# Patient Record
Sex: Female | Born: 1940 | Race: White | Hispanic: No | Smoking: Never smoker
Health system: Southern US, Community
[De-identification: ages and names within clinical notes are randomized; demographics above are authoritative.]

## PROBLEM LIST (undated history)

## (undated) DIAGNOSIS — M810 Age-related osteoporosis without current pathological fracture: Secondary | ICD-10-CM

## (undated) DIAGNOSIS — I10 Essential (primary) hypertension: Secondary | ICD-10-CM

## (undated) DIAGNOSIS — F32A Depression, unspecified: Secondary | ICD-10-CM

## (undated) DIAGNOSIS — F329 Major depressive disorder, single episode, unspecified: Secondary | ICD-10-CM

## (undated) DIAGNOSIS — E876 Hypokalemia: Secondary | ICD-10-CM

## (undated) DIAGNOSIS — I639 Cerebral infarction, unspecified: Secondary | ICD-10-CM

## (undated) DIAGNOSIS — R739 Hyperglycemia, unspecified: Secondary | ICD-10-CM

## (undated) DIAGNOSIS — R2681 Unsteadiness on feet: Secondary | ICD-10-CM

## (undated) DIAGNOSIS — F419 Anxiety disorder, unspecified: Secondary | ICD-10-CM

## (undated) DIAGNOSIS — R42 Dizziness and giddiness: Secondary | ICD-10-CM

## (undated) HISTORY — PX: BREAST BIOPSY: SHX20

## (undated) HISTORY — DX: Cerebral infarction, unspecified: I63.9

## (undated) HISTORY — DX: Age-related osteoporosis without current pathological fracture: M81.0

## (undated) HISTORY — DX: Hypokalemia: E87.6

## (undated) HISTORY — DX: Major depressive disorder, single episode, unspecified: F32.9

## (undated) HISTORY — PX: CHOLECYSTECTOMY: SHX55

## (undated) HISTORY — DX: Unsteadiness on feet: R26.81

## (undated) HISTORY — DX: Depression, unspecified: F32.A

## (undated) HISTORY — DX: Anxiety disorder, unspecified: F41.9

## (undated) HISTORY — PX: VAGINAL HYSTERECTOMY: SUR661

## (undated) HISTORY — DX: Hyperglycemia, unspecified: R73.9

---

## 2002-11-11 ENCOUNTER — Other Ambulatory Visit: Admission: RE | Admit: 2002-11-11 | Discharge: 2002-11-11 | Payer: Self-pay | Admitting: Gynecology

## 2004-09-29 ENCOUNTER — Ambulatory Visit: Payer: Self-pay | Admitting: Internal Medicine

## 2005-11-28 ENCOUNTER — Ambulatory Visit: Payer: Self-pay | Admitting: Internal Medicine

## 2007-02-14 ENCOUNTER — Ambulatory Visit: Payer: Self-pay | Admitting: Internal Medicine

## 2008-04-16 ENCOUNTER — Ambulatory Visit: Payer: Self-pay | Admitting: Internal Medicine

## 2010-03-02 ENCOUNTER — Ambulatory Visit: Payer: Self-pay | Admitting: Internal Medicine

## 2011-03-20 ENCOUNTER — Ambulatory Visit: Payer: Self-pay | Admitting: Internal Medicine

## 2011-03-21 ENCOUNTER — Ambulatory Visit: Payer: Self-pay | Admitting: Internal Medicine

## 2012-02-01 DIAGNOSIS — I639 Cerebral infarction, unspecified: Secondary | ICD-10-CM

## 2012-02-01 HISTORY — DX: Cerebral infarction, unspecified: I63.9

## 2012-02-24 ENCOUNTER — Observation Stay (HOSPITAL_COMMUNITY): Payer: Medicare Other

## 2012-02-24 ENCOUNTER — Inpatient Hospital Stay (HOSPITAL_COMMUNITY)
Admission: EM | Admit: 2012-02-24 | Discharge: 2012-02-29 | DRG: 065 | Disposition: A | Discharge status: Home / Self Care | Payer: Medicare Other | Attending: Internal Medicine | Admitting: Internal Medicine

## 2012-02-24 ENCOUNTER — Encounter (HOSPITAL_COMMUNITY): Payer: Self-pay | Admitting: *Deleted

## 2012-02-24 DIAGNOSIS — H8110 Benign paroxysmal vertigo, unspecified ear: Secondary | ICD-10-CM | POA: Diagnosis present

## 2012-02-24 DIAGNOSIS — T502X5A Adverse effect of carbonic-anhydrase inhibitors, benzothiadiazides and other diuretics, initial encounter: Secondary | ICD-10-CM | POA: Diagnosis present

## 2012-02-24 DIAGNOSIS — R2681 Unsteadiness on feet: Secondary | ICD-10-CM | POA: Diagnosis present

## 2012-02-24 DIAGNOSIS — I10 Essential (primary) hypertension: Secondary | ICD-10-CM | POA: Diagnosis present

## 2012-02-24 DIAGNOSIS — R269 Unspecified abnormalities of gait and mobility: Secondary | ICD-10-CM | POA: Diagnosis present

## 2012-02-24 DIAGNOSIS — R7309 Other abnormal glucose: Secondary | ICD-10-CM | POA: Diagnosis not present

## 2012-02-24 DIAGNOSIS — I6789 Other cerebrovascular disease: Secondary | ICD-10-CM | POA: Diagnosis present

## 2012-02-24 DIAGNOSIS — E876 Hypokalemia: Secondary | ICD-10-CM | POA: Diagnosis present

## 2012-02-24 DIAGNOSIS — E785 Hyperlipidemia, unspecified: Secondary | ICD-10-CM | POA: Diagnosis present

## 2012-02-24 DIAGNOSIS — R42 Dizziness and giddiness: Secondary | ICD-10-CM

## 2012-02-24 DIAGNOSIS — Z79899 Other long term (current) drug therapy: Secondary | ICD-10-CM

## 2012-02-24 DIAGNOSIS — R112 Nausea with vomiting, unspecified: Secondary | ICD-10-CM | POA: Diagnosis present

## 2012-02-24 DIAGNOSIS — S2239XA Fracture of one rib, unspecified side, initial encounter for closed fracture: Secondary | ICD-10-CM | POA: Diagnosis present

## 2012-02-24 DIAGNOSIS — X58XXXA Exposure to other specified factors, initial encounter: Secondary | ICD-10-CM | POA: Diagnosis present

## 2012-02-24 DIAGNOSIS — I639 Cerebral infarction, unspecified: Secondary | ICD-10-CM

## 2012-02-24 DIAGNOSIS — I6930 Unspecified sequelae of cerebral infarction: Secondary | ICD-10-CM | POA: Diagnosis present

## 2012-02-24 DIAGNOSIS — R739 Hyperglycemia, unspecified: Secondary | ICD-10-CM | POA: Diagnosis present

## 2012-02-24 DIAGNOSIS — I69398 Other sequelae of cerebral infarction: Secondary | ICD-10-CM

## 2012-02-24 DIAGNOSIS — I6350 Cerebral infarction due to unspecified occlusion or stenosis of unspecified cerebral artery: Principal | ICD-10-CM | POA: Diagnosis present

## 2012-02-24 DIAGNOSIS — Y92009 Unspecified place in unspecified non-institutional (private) residence as the place of occurrence of the external cause: Secondary | ICD-10-CM | POA: Diagnosis present

## 2012-02-24 HISTORY — DX: Dizziness and giddiness: R42

## 2012-02-24 HISTORY — DX: Essential (primary) hypertension: I10

## 2012-02-24 LAB — URINALYSIS, ROUTINE W REFLEX MICROSCOPIC
Bilirubin Urine: NEGATIVE
Glucose, UA: NEGATIVE mg/dL
Hgb urine dipstick: NEGATIVE
Ketones, ur: NEGATIVE mg/dL
Leukocytes, UA: NEGATIVE
Nitrite: NEGATIVE
Protein, ur: NEGATIVE mg/dL
Specific Gravity, Urine: 1.016 (ref 1.005–1.030)
Urobilinogen, UA: 0.2 mg/dL (ref 0.0–1.0)
pH: 7.5 (ref 5.0–8.0)

## 2012-02-24 MED ORDER — ONDANSETRON HCL 8 MG PO TABS
8.0000 mg | ORAL_TABLET | Freq: Once | ORAL | Status: AC
  Administered 2012-02-24: 8 mg via ORAL
  Filled 2012-02-24: qty 1

## 2012-02-24 MED ORDER — MECLIZINE HCL 25 MG PO TABS
25.0000 mg | ORAL_TABLET | Freq: Once | ORAL | Status: AC
  Administered 2012-02-24: 25 mg via ORAL
  Filled 2012-02-24: qty 1

## 2012-02-24 MED ORDER — LOSARTAN POTASSIUM 50 MG PO TABS
50.0000 mg | ORAL_TABLET | Freq: Every day | ORAL | Status: DC
  Filled 2012-02-24: qty 1

## 2012-02-24 MED ORDER — HYDROCHLOROTHIAZIDE 12.5 MG PO CAPS: 12.5000 mg | ORAL_CAPSULE | Freq: Every day | ORAL | Status: DC

## 2012-02-24 MED ORDER — PROMETHAZINE HCL 25 MG/ML IJ SOLN
25.0000 mg | INTRAMUSCULAR | Status: AC
  Administered 2012-02-24: 25 mg via INTRAVENOUS
  Filled 2012-02-24 (×2): qty 1

## 2012-02-24 MED ORDER — ONDANSETRON 8 MG/NS 50 ML IVPB
8.0000 mg | INTRAVENOUS | Status: DC
  Filled 2012-02-24: qty 8

## 2012-02-24 MED ORDER — PROMETHAZINE HCL 25 MG/ML IJ SOLN
12.5000 mg | INTRAMUSCULAR | Status: AC
  Administered 2012-02-24: 12.5 mg via INTRAVENOUS
  Filled 2012-02-24: qty 1

## 2012-02-24 MED ORDER — ONDANSETRON HCL 4 MG/2ML IJ SOLN
INTRAMUSCULAR | Status: AC
  Administered 2012-02-24: 11:00:00
  Filled 2012-02-24: qty 2

## 2012-02-24 MED ORDER — LOSARTAN POTASSIUM-HCTZ 50-12.5 MG PO TABS: 1.0000 | ORAL_TABLET | Freq: Every day | ORAL | Status: DC

## 2012-02-24 MED ORDER — DIAZEPAM 5 MG/ML IJ SOLN
5.0000 mg | Freq: Once | INTRAMUSCULAR | Status: DC
  Filled 2012-02-24: qty 2

## 2012-02-24 MED ORDER — DIAZEPAM 5 MG/ML IJ SOLN
5.0000 mg | Freq: Once | INTRAMUSCULAR | Status: AC
  Administered 2012-02-24: 5 mg via INTRAVENOUS

## 2012-02-24 MED ORDER — LOSARTAN POTASSIUM 50 MG PO TABS
50.0000 mg | ORAL_TABLET | Freq: Every day | ORAL | Status: DC
  Administered 2012-02-25: 50 mg via ORAL
  Filled 2012-02-24 (×3): qty 1

## 2012-02-24 NOTE — ED Notes (Signed)
Yesterday - husband had to picked up pt. B/c of acute onset of vertigo; now, she is c/o ?9th rib pain; went to urgent care for rib pain - 9th rib fx; still having vertigo today.

## 2012-02-24 NOTE — ED Notes (Signed)
Pt. Given po ice chips

## 2012-02-24 NOTE — ED Notes (Signed)
I gave the patient a cup of ice. 

## 2012-02-24 NOTE — ED Provider Notes (Signed)
Medical screening examination/treatment/procedure(s) were conducted as a shared visit with non-physician practitioner(s) and myself.  I personally evaluated the patient during the encounter.  70yf with vertigo. Suspect peripheral etiology. Very positional. Reproducible with dix hallpike but not really fatigueable with repeat maneuvers as would expect with peripheral cause. Negative Hennebert's sign and no hx of significant head trauma. No hx of potentially ototoxic med use such as aminoglycoside or furosemide. No vertical nystagmus. Good finger to nose b/l. Could not assess gait because too symptomatic to attempt walking. Pt medicated in ED with little/no improvement. I suspect that this is peripheral but some signs/symptoms raise question of central cause. Will MR to further eval. Continued symptomatic tx.   Raeford Razor, MD 02/24/12 [phone]

## 2012-02-24 NOTE — ED Provider Notes (Signed)
History     CSN: [id number]  Arrival date & time 02/24/12  1032   First MD Initiated Contact with Patient 02/24/12 1221      Chief Complaint  Patient presents with  . Dizziness  . Rib Injury    (Consider location/radiation/quality/duration/timing/severity/associated sxs/prior treatment) HPI Comments: 71 y/o female presents with sudden onset nausea, vomiting, and dizziness last night. Has a hx of vertigo and states this feels exactly the same. ENT prescribed her meclizine and valium for vertigo, tried taking them and vomited them back up. Dizziness worse with head movement. Denies any visual changes, blurred vision, tinnitus, ear pain, headache, chest pain, sob, fever, chills. Broke her rib a week and a half ago and is in some discomfort with that, but has not taken her percocet.  The history is provided by the patient and the spouse.    Past Medical History  Diagnosis Date  . Hypertension   . Vertigo     No past surgical history on file.  No family history on file.  History  Substance Use Topics  . Smoking status: Never Smoker   . Smokeless tobacco: Not on file  . Alcohol Use: No    OB History    Grav Para Term Preterm Abortions TAB SAB Ect Mult Living                  Review of Systems  Constitutional: Negative for fever and chills.  HENT: Negative for ear pain and tinnitus.   Eyes: Negative for visual disturbance.  Respiratory: Negative for shortness of breath.   Cardiovascular: Negative for chest pain.  Gastrointestinal: Positive for nausea and vomiting.  Neurological: Positive for dizziness. Negative for headaches.    Allergies  Demerol and Penicillins  Home Medications   Current Outpatient Rx  Name Route Sig Dispense Refill  . DIAZEPAM 2 MG PO TABS Oral Take 1 mg by mouth daily as needed. For dizziness.     Marland Kitchen LOSARTAN POTASSIUM-HCTZ 50-12.5 MG PO TABS Oral Take 1 tablet by mouth daily.    Marland Kitchen MECLIZINE HCL 25 MG PO TABS Oral Take 25 mg by mouth 3  (three) times daily as needed. For vertigo.    Marland Kitchen POTASSIUM PO Oral Take 1 tablet by mouth daily.      BP 177/80  Pulse 81  Temp 97.7 F (36.5 C) (Oral)  Resp 20  SpO2 97%  Physical Exam  Constitutional: She is oriented to person, place, and time. She appears well-developed and well-nourished. No distress.  HENT:  Head: Normocephalic and atraumatic.  Right Ear: Hearing, tympanic membrane, external ear and ear canal normal.  Left Ear: Hearing, tympanic membrane, external ear and ear canal normal.  Nose: Nose normal.  Mouth/Throat: Uvula is midline, oropharynx is clear and moist and mucous membranes are normal.  Eyes: Conjunctivae and EOM are normal. Pupils are equal, round, and reactive to light. Right eye exhibits no nystagmus. Left eye exhibits no nystagmus.  Neck: Neck supple.  Cardiovascular: Normal rate, regular rhythm, normal heart sounds and intact distal pulses.   Pulmonary/Chest: Effort normal and breath sounds normal.  Abdominal: Soft. Normal appearance and bowel sounds are normal. There is no tenderness.  Neurological: She is alert and oriented to person, place, and time.       Unsteady on feet- slow gait. Dizziness exacerbated by head movements.  Skin: Skin is warm and dry.  Psychiatric: She has a normal mood and affect. Her speech is normal and behavior is normal.  ED Course  Procedures (including critical care time)   Labs Reviewed  CBC WITH DIFFERENTIAL   No results found.  Date: 02/24/2012  Rate: 90  Rhythm: normal sinus rhythm  QRS Axis: left  Intervals: normal  ST/T Wave abnormalities: nonspecific T wave changes  Conduction Disutrbances:none  Narrative Interpretation: no stemi  Old EKG Reviewed: none available    No diagnosis found.    MDM  71 y/o female with dizziness and peripheral vertigo symptoms. Symptoms somewhat improved with phenergan and valium. She is concerned for a stroke. No focal neuro deficits concerning stroke. No findings  consistent with central vertigo. Patient is convinced something is wrong with her. Case discussed with Dr. Juleen China who also evaluated patient. Will do MRI. Patient moved to CDU. Case discussed with Emilia Beck, PA-C who will take over care of patient at this time.        Trevor Mace, PA-C 02/24/12 1505

## 2012-02-24 NOTE — ED Provider Notes (Signed)
Assumed care in CDU.  71 year old female presents for evaluation of dizziness.  She is being sent to CDU awaiting for brain MRI to r/o posterior circular stroke.    5:14 PM Pt continues to endorse vertiginous sxs, with associate nausea and vomiting.  Unable to tolerates MRI at this time.  Will continue to treat sxs, and will reassess.  Pt otherwise denies of double vision, headache, or numbness.    5:48 PM MRI tech came to take pt to MRI but pt continues to be symptomatic.  They will return in half an hr to take pt to MRI.    6:25 PM Pt unable to tolerate MRI currently.  Plan to keep pt in CDU over night and continues to treat her symptoms, will obtain brain MRI tomorrow morning.  Pt and family member in agreement with plan.    10:37 PM Pt request for nightly med, Hyzaar, will order  11:41 PM Pt sts she feels about the same.  Have not vomited, only mild nausea.  Will give meclizine.  Dr. Clarene Duke is aware of pt and will monitor over night.  Pt for brain MRI tomorrow.    Fayrene Helper, PA-C 02/24/12 2342

## 2012-02-25 ENCOUNTER — Inpatient Hospital Stay (HOSPITAL_COMMUNITY): Payer: Medicare Other

## 2012-02-25 ENCOUNTER — Encounter (HOSPITAL_COMMUNITY): Payer: Self-pay | Admitting: Internal Medicine

## 2012-02-25 ENCOUNTER — Observation Stay (HOSPITAL_COMMUNITY): Payer: Medicare Other

## 2012-02-25 DIAGNOSIS — R112 Nausea with vomiting, unspecified: Secondary | ICD-10-CM | POA: Diagnosis present

## 2012-02-25 DIAGNOSIS — I6930 Unspecified sequelae of cerebral infarction: Secondary | ICD-10-CM | POA: Diagnosis present

## 2012-02-25 DIAGNOSIS — R42 Dizziness and giddiness: Secondary | ICD-10-CM | POA: Diagnosis present

## 2012-02-25 DIAGNOSIS — R2681 Unsteadiness on feet: Secondary | ICD-10-CM | POA: Diagnosis present

## 2012-02-25 LAB — CK TOTAL AND CKMB (NOT AT ARMC)
CK, MB: 6 ng/mL — ABNORMAL HIGH (ref 0.3–4.0)
Total CK: 386 U/L — ABNORMAL HIGH (ref 7–177)

## 2012-02-25 LAB — DIFFERENTIAL
Basophils Relative: 0 % (ref 0–1)
Lymphocytes Relative: 17 % (ref 12–46)
Monocytes Absolute: 0.6 10*3/uL (ref 0.1–1.0)
Monocytes Relative: 6 % (ref 3–12)
Neutro Abs: 8 10*3/uL — ABNORMAL HIGH (ref 1.7–7.7)

## 2012-02-25 LAB — COMPREHENSIVE METABOLIC PANEL
BUN: 15 mg/dL (ref 6–23)
CO2: 26 mEq/L (ref 19–32)
Chloride: 103 mEq/L (ref 96–112)
Creatinine, Ser: 0.77 mg/dL (ref 0.50–1.10)
GFR calc non Af Amer: 83 mL/min — ABNORMAL LOW (ref >90)
Total Bilirubin: 0.5 mg/dL (ref 0.3–1.2)

## 2012-02-25 LAB — CBC
HCT: 35.7 % — ABNORMAL LOW (ref 36.0–46.0)
Hemoglobin: 12.2 g/dL (ref 12.0–15.0)
MCHC: 34.2 g/dL (ref 30.0–36.0)
MCV: 85.4 fL (ref 78.0–100.0)

## 2012-02-25 LAB — APTT: aPTT: 27 seconds (ref 24–37)

## 2012-02-25 MED ORDER — SENNOSIDES-DOCUSATE SODIUM 8.6-50 MG PO TABS
1.0000 | ORAL_TABLET | Freq: Every evening | ORAL | Status: DC | PRN
  Filled 2012-02-25: qty 1

## 2012-02-25 MED ORDER — PROMETHAZINE HCL 25 MG/ML IJ SOLN
12.5000 mg | INTRAMUSCULAR | Status: AC
  Administered 2012-02-25: 12.5 mg via INTRAVENOUS
  Filled 2012-02-25: qty 1

## 2012-02-25 MED ORDER — MECLIZINE HCL 25 MG PO TABS
25.0000 mg | ORAL_TABLET | Freq: Three times a day (TID) | ORAL | Status: DC | PRN
  Administered 2012-02-25 – 2012-02-28 (×7): 25 mg via ORAL
  Filled 2012-02-25 (×8): qty 1

## 2012-02-25 MED ORDER — MECLIZINE HCL 25 MG PO TABS
25.0000 mg | ORAL_TABLET | Freq: Once | ORAL | Status: AC
  Administered 2012-02-25: 25 mg via ORAL
  Filled 2012-02-25: qty 1

## 2012-02-25 MED ORDER — DIAZEPAM 2 MG PO TABS: 1.0000 mg | ORAL_TABLET | Freq: Four times a day (QID) | ORAL | Status: DC | PRN

## 2012-02-25 MED ORDER — ASPIRIN 300 MG RE SUPP
300.0000 mg | Freq: Every day | RECTAL | Status: DC
  Filled 2012-02-25 (×5): qty 1

## 2012-02-25 MED ORDER — ACETAMINOPHEN 650 MG RE SUPP: 650.0000 mg | RECTAL | Status: DC | PRN

## 2012-02-25 MED ORDER — HEPARIN SODIUM (PORCINE) 5000 UNIT/ML IJ SOLN
5000.0000 [IU] | Freq: Three times a day (TID) | INTRAMUSCULAR | Status: DC
  Administered 2012-02-25 – 2012-02-29 (×11): 5000 [IU] via SUBCUTANEOUS
  Filled 2012-02-25 (×15): qty 1

## 2012-02-25 MED ORDER — ASPIRIN 325 MG PO TABS
325.0000 mg | ORAL_TABLET | Freq: Every day | ORAL | Status: DC
  Administered 2012-02-25 – 2012-02-28 (×4): 325 mg via ORAL
  Filled 2012-02-25 (×5): qty 1

## 2012-02-25 MED ORDER — PROMETHAZINE HCL 25 MG/ML IJ SOLN
12.5000 mg | Freq: Four times a day (QID) | INTRAMUSCULAR | Status: DC | PRN
  Administered 2012-02-25: 12.5 mg via INTRAVENOUS
  Filled 2012-02-25 (×2): qty 1

## 2012-02-25 MED ORDER — ACETAMINOPHEN 325 MG PO TABS
650.0000 mg | ORAL_TABLET | ORAL | Status: DC | PRN
  Filled 2012-02-25: qty 2

## 2012-02-25 NOTE — ED Provider Notes (Signed)
Medical screening examination/treatment/procedure(s) were conducted as a shared visit with non-physician practitioner(s) and myself.  I personally evaluated the patient during the encounter.  Please see completed note for this encounter.  Raeford Razor, MD 02/25/12 [phone]

## 2012-02-25 NOTE — H&P (Signed)
Hospital Admission Note Date: 02/25/2012  Patient name: Jill Hancock Medical record number: [id number] Date of birth: Nov 05, 1940 Age: 71 y.o. Gender: female PCP: No primary provider on file.  Medical Service: Internal Medicine Teaching Service--[facility]  Attending physician: Dr. Blanch Media    1st Contact: Dr. Cicero Duck 2nd Contact: Dr. Coralyn Pear   MWNUU:[id number] After 5 pm or weekends: 1st Contact:      Pager: [phone] 2nd Contact:      Pager: [phone]  Chief Complaint: Vertigo x3 days   History of Present Illness: Jill Hancock is 71 year old female with PMH of HTN (uncontrolled) and Vertigo (12-14 years) presenting to the ED Saturday, 02/21/12 afternoon after worsening vertigo that started Friday night.  Jill Hancock claims she has suffered from sudden attacks of vertigo for approximately 12-14 years, each episode spontaneously resolving after a few days to maximum one week.  Her last major episode was approximately 2 months ago that resolved in one week and for which she takes Meclizine Q4H PRN.  She and her husband claim that she started having nausea and vertigo late Friday, February 23, 2012 night and then woke up on 02/24/12 with continuous vertigo, unsteady gait, and unable to keep her balance even when sitting in a chair.  They checked her BP at home and report ~150s/100s and then report when EMS arrived BP was found to be 171/95.  Jill Hancock also had multiple episodes of vomiting on Saturday and continuous nausea.  Both husband and wife claim that normally they just let [name] sleep off the symptoms, but she has never had unsteady gait or unable to keep balance before, so they decided to come to the hospital.  She denies any falls, lightheadedness, LOC, weakness, slurred speech, blurry vision, headaches, chest pain, shortness of breath, or abdominal pain at this time.  She has not vomited today but continues to have nausea and feels unsteady and reluctant to stand.  Husband  and wife report [name] to recently have high cholesterol with LDL~130 and HDL~56.    While In the ED: Given Valium 5mg  IV x1, Losartan 50mg  x1, and Meclizine 25mg  x4 doses. She was unable to have MRI on 02/24/12 secondary to vomiting and continuous nausea but had one morning of 02/25/12: + at least three areas of subcentimeter acute infaraction with one area of subacute infarction in widely disparate vascular territories along with widespread lacunar infarcts, chronic microvascular ischemic change, and numerous microbleeds.  + left cerebellar white matter dorsal to 4th ventricle lesion.  MRA+ intracranial atherosclerotic changes with possible flow limiting RPCA stenosis.  Of note, Jill Hancock used to see Dr. Candelaria Stagers as her PCP prior to him retiring in July who apparently diagnosed her with positional vertigo.  She was then referred to Dr. Jenne Campus for ENT and claims she was told it was inner ear related.  Jill Hancock has been seeing Dr. Consuelo Pandy recently who she claims is more naturalistic medicine based and gets labwork done every 6 months.  She has been taking weekly b12 injections, vit d3, kdur, and metformin 500mg  QHS, occasional baby ASA, and 2 DHEA tablets QAM along with her long term dose of Hyzaar 50-12.5mg  QD.  She is also noted to have a right 9th rib fracture secondary to her husband picking her up from the car last week and taking her in the house.    Meds: Current Outpatient Rx  Name Route Sig Dispense Refill  . DIAZEPAM 2 MG PO TABS Oral Take  1 mg by mouth daily as needed. For dizziness.     Marland Kitchen LOSARTAN POTASSIUM-HCTZ 50-12.5 MG PO TABS Oral Take 1 tablet by mouth daily.    Marland Kitchen MECLIZINE HCL 25 MG PO TABS Oral Take 25 mg by mouth 3 (three) times daily as needed. For vertigo.    Marland Kitchen POTASSIUM PO Oral Take 1 tablet by mouth daily.     Allergies: Allergies as of 02/24/2012 - Review Complete 02/24/2012  Allergen Reaction Noted  . Demerol (meperidine) Nausea And Vomiting 02/24/2012  .  Penicillins Rash 02/24/2012   Past Medical History  Diagnosis Date  . Hypertension   . Vertigo    History   Social History  . Marital Status: Unknown    Spouse Name: N/A    Number of Children: N/A  . Years of Education: N/A   Occupational History  . Not on file.   Social History Main Topics  . Smoking status: Never Smoker   . Smokeless tobacco: Not on file  . Alcohol Use: No  . Drug Use: No  . Sexually Active:    Other Topics Concern  . Not on file   Social History Narrative  . No narrative on file   Review of Systems:  Constitutional:  Denies fever, chills, diaphoresis, appetite change and fatigue.   HEENT:  Denies congestion, sore throat, rhinorrhea, sneezing, mouth sores, trouble swallowing, neck pain, tinnitus   Respiratory:  Denies SOB, DOE, cough, and wheezing.   Cardiovascular:  Denies palpitations and leg swelling.   Gastrointestinal:  + nausea, vomiting.  Denies abdominal pain, diarrhea, constipation, blood in stool and abdominal distention.   Genitourinary:  Denies dysuria, urgency, frequency, hematuria, flank pain and difficulty urinating.   Musculoskeletal:  + unsteady gait, leaning to Right side. + R 9th rib fx. Denies myalgias, back pain, joint swelling, and arthralgias.   Skin:  Denies pallor, rash and wound.   Neurological:  + vertigo, unable to balance.  Denies seizures, syncope, weakness, light-headedness, numbness and headaches.    Physical Exam: Blood pressure 148/70, pulse 88, temperature 98.1 F (36.7 C), temperature source Oral, resp. rate 14, SpO2 96.00%. Vitals reviewed. General: resting in bed, leaning to right side, NAD HEENT: PERRLA, EOMI, no scleral icterus, possible slight left eye horizontal nystagmus Cardiac: RRR, no rubs, murmurs or gallops Pulm: clear to auscultation bilaterally, no wheezes, rales, or rhonchi Abd: soft, nontender, nondistended, +BS present Ext: warm and well perfused, no pedal edema Neuro: alert and oriented X3,  cranial nerves II-XII grossly intact, strength and sensation to light touch equal in bilateral upper and lower extremities.  Unable to assess gait as she refuses to walk out of bed secondary to feeling unsteady and fear of falling, finger to nose slightly less on left,  good heel to shin b/l, good to thumb to fingers touch.  Slow paced.    Lab results: Basic Metabolic Panel:  Basename 02/25/12 1006  NA 139  K 3.4*  CL 103  CO2 26  GLUCOSE 99  BUN 15  CREATININE 0.77  CALCIUM 9.1  MG --  PHOS --   Liver Function Tests:  Basename 02/25/12 1006  AST 24  ALT 21  ALKPHOS 48  BILITOT 0.5  PROT 6.3  ALBUMIN 3.2*   CBC:  Basename 02/25/12 1006  WBC 10.4  NEUTROABS 8.0*  HGB 12.2  HCT 35.7*  MCV 85.4  PLT 275   Cardiac Enzymes:  Basename 02/25/12 1006  CKTOTAL 386*  CKMB 6.0*  CKMBINDEX --  TROPONINI <  0.30   Coagulation:  Basename 02/25/12 1006  LABPROT 14.3  INR 1.09   Urinalysis:  Basename 02/24/12 1655  COLORURINE YELLOW  LABSPEC 1.016  PHURINE 7.5  GLUCOSEU NEGATIVE  HGBUR NEGATIVE  BILIRUBINUR NEGATIVE  KETONESUR NEGATIVE  PROTEINUR NEGATIVE  UROBILINOGEN 0.2  NITRITE NEGATIVE  LEUKOCYTESUR NEGATIVE   Imaging results:  Mr Shirlee Latch EA Contrast  02/25/2012  *RADIOLOGY REPORT*  Clinical Data:  Hypertension.  Sudden onset of nausea, vomiting, and dizziness.  History of vertigo.  MRI HEAD WITHOUT CONTRAST MRA HEAD WITHOUT CONTRAST  Technique:  Multiplanar, multiecho pulse sequences of the brain and surrounding structures were obtained without intravenous contrast. Angiographic images of the head were obtained using MRA technique without contrast.  Comparison:   None  MRI HEAD  Findings:   There are three small acute areas of cerebral infarction, and one subcentimeter area of subacute infarction.  The lesions which are most likely be responsible for vertigo is located in the left cerebellar white matter dorsal to the fourth ventricle (image 7 series 3,  approximately 3 x 5 mm), and/or in the left paramedian midbrain (image 16 series 5, 3 x 3 mm).  The third lesion, in a different vascular territory, is seen in the right frontal periventricular white matter just superior to the caudate nucleus, approximately 3 x 4 mm cross-section.  A fourth lesion with low level restricted diffusion is seen in the left occipital subcortical white matter, likely days to a few weeks old.  There is no mass lesion or acute hemorrhage.  There is no hydrocephalus or extra-axial fluid.  Gradient echo images, sensitive to chronic hemorrhage, show numerous supratentorial and infratentorial areas of subcentimeter chronic hemorrhage.  These are consistent with longstanding cerebrovascular disease secondary hypertension (microbleeds).  There is mild to moderate age related atrophy.  Numerous chronic lacunar infarcts are seen in the centrum semiovale.  There is extensive chronic microvascular ischemic change affecting primarily the supratentorial subcortical and periventricular white matter, also likely sequelae of hypertensive cerebrovascular disease.  No midline shift.  Normal pituitary and cerebellar tonsils. Unremarkable skull base and calvarium.  No acute sinus or mastoid disease.  IMPRESSION: At least three areas of subcentimeter acute infarction with one area of subacute infarction.  These involve widely disparate vascular territories. 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  MRA HEAD  Findings: The internal carotid arteries are widely patent.  The basilar artery shows mild nonstenotic irregularity just cephalad to the origin of the anterior inferior cerebellar artery. The right vertebral is the dominant contributor to the basilar without focal stenosis.  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  Both posterior cerebral arteries originate from the carotids.  There is no proximal stenosis of the anterior or middle cerebral arteries.  There is moderately severe narrowing of the right PCA in its ambient P2 segment.  There is no visible intracranial aneurysm or dissection. There is no visible cerebellar branch occlusion.  IMPRESSION: Intracranial atherosclerotic changes as described.  Potentially flow limiting stenosis right PCA, not accompanied by acute cerebral infarction however.   Original Report Authenticated By: Elsie Stain, M.D.    Mr Brain Wo Contrast  02/25/2012  *RADIOLOGY REPORT*  Clinical Data:  Hypertension.  Sudden onset of nausea, vomiting, and dizziness.  History of vertigo.  MRI HEAD WITHOUT CONTRAST MRA HEAD WITHOUT CONTRAST  Technique:  Multiplanar, multiecho  pulse sequences of the brain and surrounding structures were obtained without intravenous contrast. Angiographic images of the head were obtained using MRA technique without contrast.  Comparison:   None  MRI HEAD  Findings:   There are three small acute areas of cerebral infarction, and one subcentimeter area of subacute infarction.  The lesions which are most likely be responsible for vertigo is located in the left cerebellar white matter dorsal to the fourth ventricle (image 7 series 3, approximately 3 x 5 mm), and/or in the left paramedian midbrain (image 16 series 5, 3 x 3 mm).  The third lesion, in a different vascular territory, is seen in the right frontal periventricular white matter just superior to the caudate nucleus, approximately 3 x 4 mm cross-section.  A fourth lesion with low level restricted diffusion is seen in the left occipital subcortical white matter, likely days to a few weeks old.  There is no mass lesion or acute hemorrhage.  There is no hydrocephalus or extra-axial fluid.  Gradient echo images, sensitive to chronic hemorrhage, show numerous supratentorial and infratentorial areas  of subcentimeter chronic hemorrhage.  These are consistent with longstanding cerebrovascular disease secondary hypertension (microbleeds).  There is mild to moderate age related atrophy.  Numerous chronic lacunar infarcts are seen in the centrum semiovale.  There is extensive chronic microvascular ischemic change affecting primarily the supratentorial subcortical and periventricular white matter, also likely sequelae of hypertensive cerebrovascular disease.  No midline shift.  Normal pituitary and cerebellar tonsils. Unremarkable skull base and calvarium.  No acute sinus or mastoid disease.  IMPRESSION: At least three areas of subcentimeter acute infarction with one area of subacute infarction.  These involve widely disparate vascular territories. 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  MRA HEAD  Findings: The internal carotid arteries are widely patent.  The basilar artery shows mild nonstenotic irregularity just cephalad to the origin of the anterior inferior cerebellar artery. The right vertebral is the dominant contributor to the basilar without focal stenosis.  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  Both posterior cerebral arteries originate from the carotids.  There is no proximal stenosis of the anterior or middle cerebral arteries.  There is moderately severe narrowing of the right PCA in its ambient P2 segment.  There is no visible intracranial aneurysm or dissection. There is no visible cerebellar branch occlusion.  IMPRESSION: Intracranial atherosclerotic changes as described.  Potentially flow limiting stenosis right PCA, not accompanied by acute cerebral infarction however.   Original Report Authenticated By: Elsie Stain, M.D.    Other results: EKG: 90bpm sinus rhythm with nonspecific  t wave changes mainly in lead III, avf, v3  Assessment & Plan by Problem: 71 year old woman with past medical history significant for HTN, vertigo for 12-14 years comes to the ER with complaints of vertigo, nausea and vomiting x3 days. She presented to the ED 02/24/12 and had MRI today that confirmed the diagnosis of acute ischemic stroke.   1.Acute ischemic stroke:  Presented with symptoms of vertigo , nausea and vomiting. On our exam ,she is leaning towards right with ? left abnormal finger to nose. She had MRI that showed three small areas of acute cerebral infarction and an area in the territory of left cerebellum and around right caudate nucleus. Given the distribution of her stroke, we are suspicious towards stroke being embolic in origin.  She used to take ASA off and on at home. - Admit to telemetry  - Neuro checks - Obtain 2 D echo, carotid dopplers and possibly TEE.  - Risk stratification - AIC, Lipid panel.  - Hold antihypertensive agents to allow permissive HTN.  - Symptomatic treatment of vertigo with PRN valium, meclizine - Phenergan for nausea. - Neuro consult to discuss the need for anticoagulation.  2. HTN: Given Cozaar 50mg  x1 in ED. Hx of uncontrolled HTN with Hyzaar 50-12.5mg  at home - Hold all antihypertensive medications for now with MRI confirming the acute stroke.  -monitor vitals  3. Hypokalemia: likely 2/2 diuretics. - check Mg. - replete  DVT: heparin  Diet: heart healthy Dispo: pending clinical improvement, neuro recommendations.   SignedDarden Palmer 02/25/2012, 12:58 PM

## 2012-02-25 NOTE — ED Notes (Signed)
Patient was used bedpan to void.

## 2012-02-25 NOTE — ED Provider Notes (Signed)
Medical screening examination/treatment/procedure(s) were performed by non-physician practitioner and as supervising physician I was immediately available for consultation/collaboration.  Raeford Razor, MD 02/25/12 [phone]

## 2012-02-25 NOTE — ED Notes (Signed)
Pt. Continues to have periods of dizziness with movement. Pt. Denies any nausea.    Pt. Unable to ambulate due to her dizziness

## 2012-02-25 NOTE — Consult Note (Signed)
Reason for Consult: Stroke Referring Physician: Blanch Media  CC: Vertigo  History is obtained from: Patient, husband  HPI: Jill Hancock is an 71 y.o. female who presents with sudden onset vertigo that started yesterday. She states that came on suddenly, and was similar to previous attacks of BPPV, but did not go away after 5 minutes as it typically does. Because it was not resolving, she had her husband call EMS.  On arrival, she had a blood pressure in the 190s. An MRI was attempted yesterday, but the patient was nauseous and vomiting and unable to tolerate an MRI at the time. She did have an MRI today, which confirmed acute infarct.  Neurology has been consulted to address this infarct.  Of note, she was not taking aspirin prior to this stroke.  ROS: An 11 point ROS was performed and is negative except as noted in the HPI.  Past Medical History  Diagnosis Date  . Hypertension   . Vertigo     Family History: Brother-stroke  Social History: Tob: Denies Lives with husband  Exam: Current vital signs: BP 174/75  Pulse 73  Temp 97.9 F (36.6 C) (Oral)  Resp 18  Ht 5\' 5"  (1.651 m)  Wt 79.833 kg (176 lb)  BMI 29.29 kg/m2  SpO2 96% Vital signs in last 24 hours: Temp:  [97.9 F (36.6 C)-98.5 F (36.9 C)] 97.9 F (36.6 C) (08/25 2056) Pulse Rate:  [72-90] 73  (08/25 2056) Resp:  [14-20] 18  (08/25 2056) BP: (112-183)/(69-92) 174/75 mmHg (08/25 2056) SpO2:  [95 %-97 %] 96 % (08/25 2056) Weight:  [79.833 kg (176 lb)] 79.833 kg (176 lb) (08/25 1544)  General: Awake, in bed CV: Regular rate and rhythm Mental Status: Patient is awake, alert, oriented to person, place, month, year, and situation. Immediate and remote memory are intact. Patient is able to give a clear and coherent history. Cranial Nerves: II: Visual Fields are full. Pupils are equal, round, and reactive to light.  Discs . III,IV, VI: EOMI without ptosis or diploplia.  V,VII: Facial sensation  and movement are symmetric.  VIII: hearing is intact to voice X: Uvula elevates symmetrically XI: Shoulder shrug is symmetric. XII: tongue is midline without atrophy or fasciculations.  Motor: Tone is normal. 5/5 strength was present in all four extremities.  Sensory: Sensation is symmetric to light touch and temperature in the arms and legs. Mildly diminished distally to vibration.  Deep Tendon Reflexes: 2+ and symmetric in the biceps, patellae, and ankle.  Plantars: Toes are downgoing bilaterally.  Cerebellar: FNF and are intact bilaterally, though she is slower on left and does have mild difficulty with rapid repetitive movements on the left Gait: Patient needs assistance to stand, when attempting to take a step she falls immediately to the right  I have reviewed labs in epic and the results pertinent to this consultation are: BMP unremarkable CC unremarkable Normal glucose Normal UA EKG: NSR  I have reviewed the images obtained: MRI head- acute infarct in the right head of the caudate, and left midline cerebellum. There is also an area possible acute infarct in the midbrain, and a possible subacute area in the left occipital region(thought T2 shine through is also possible for this lesion). She has extensive white matter change and multiple old infarcts.  Reason TPA was not given: Patient outside of window  Impression: 71 year old female with acute infarcts in different vascular distributions. It is possible that she had one infarct causing a hypertensive crisis which subsequently  caused hypertensive infarcts. However, I would favor small emboli given the number and different ages and would proceed with a workup for embolic stroke.  Recommendations: 1. HgbA1c, fasting lipid panel 2. PT consult, OT consult, Speech consult 3. Echocardiogram 4. Carotid dopplers 5. Prophylactic therapy-Antiplatelet med: Aspirin - dose 325 mg 6. Risk factor modification 7. Telemetry monitoring,  would consider continuing with outpatient holter if no other embolic cause is found.  8. Frequent neuro checks 9. Permissive hypertension   Ritta Slot, MD [facility] [phone]

## 2012-02-25 NOTE — ED Provider Notes (Signed)
7:41 AM BP 112/69  Pulse 90  Temp 98.2 F (36.8 C) (Oral)  Resp 20  SpO2 97% Assumed care of patient. Patient is in the CDU awaiting MRI of the brain to rule out stroke as central calls of her vertiginous symptoms. She had sudden onset vertigo, nausea, vomiting, worse with head movements, that began yesterday. Patient was unable to get MRI yesterday due to uncontrolled nausea and vomiting.  Patient states that she is not feeling nauseated at this current time however she does still have some dizziness. She states that yesterday she was not nauseated before she went for MRI but when she arrived for her exam got extremely ill. She was unable to completely examine that time. Going to pre-dose her with some meclizine which he tolerated well last night. She states that the diazepam 2 mg made her feel "loopy". States that the room looked blurry and did not tolerate the medication well. Patient appeal appears well this morning. Was able to tolerate ice chips by mouth. CV: RRR, No M/R/G, Peripheral pulses intact. No peripheral edema. Lungs: CTAB Abd: Soft, Non tender, non distended  10:28 AM I spoke with Dr. Fonnie Jarvis took report from radiology. Showed several  small strokes, least one in the cerebellum which likely explains her vertiginous symptoms. Previous providers did not do stroke workup. I have ordered the workup. Once results have come in I will admit the patient. I spoke to the patient about her findings on MRA. I answered questions within my scope of practice. I suggested that neurology would have much better answers for her as this was a specialty issue. I spoke with the patient the mandibular plating labs and that she would be admitted to the hospital today. She does not have a primary care physician as he has retired.  11:30 AM I spoke with Dr.Sawhney from internal medicine who has agreed to see and admit the patient.  Patient is admitted to the hospital teaching service.  Arthor Captain,  PA-C 02/25/12 1605

## 2012-02-26 ENCOUNTER — Encounter (HOSPITAL_COMMUNITY): Payer: Self-pay | Admitting: *Deleted

## 2012-02-26 DIAGNOSIS — R7309 Other abnormal glucose: Secondary | ICD-10-CM

## 2012-02-26 DIAGNOSIS — I6350 Cerebral infarction due to unspecified occlusion or stenosis of unspecified cerebral artery: Principal | ICD-10-CM

## 2012-02-26 DIAGNOSIS — E876 Hypokalemia: Secondary | ICD-10-CM

## 2012-02-26 DIAGNOSIS — R269 Unspecified abnormalities of gait and mobility: Secondary | ICD-10-CM

## 2012-02-26 DIAGNOSIS — R42 Dizziness and giddiness: Secondary | ICD-10-CM

## 2012-02-26 DIAGNOSIS — I10 Essential (primary) hypertension: Secondary | ICD-10-CM

## 2012-02-26 LAB — HEMOGLOBIN A1C
Hgb A1c MFr Bld: 5.9 % — ABNORMAL HIGH (ref <5.7)
Mean Plasma Glucose: 123 mg/dL — ABNORMAL HIGH (ref <117)

## 2012-02-26 LAB — LIPID PANEL
Cholesterol: 171 mg/dL (ref 0–200)
HDL: 50 mg/dL (ref >39)
LDL Cholesterol: 96 mg/dL (ref 0–99)
Total CHOL/HDL Ratio: 3.4 RATIO

## 2012-02-26 MED ORDER — PROMETHAZINE HCL 25 MG/ML IJ SOLN
12.5000 mg | INTRAMUSCULAR | Status: DC | PRN
  Administered 2012-02-26: 12.5 mg via INTRAVENOUS
  Filled 2012-02-26 (×2): qty 1

## 2012-02-26 MED ORDER — LIDOCAINE 5 % EX PTCH
1.0000 | MEDICATED_PATCH | CUTANEOUS | Status: AC
  Administered 2012-02-26: 1 via TRANSDERMAL
  Filled 2012-02-26: qty 1

## 2012-02-26 NOTE — H&P (Signed)
Internal Medicine Teaching Service Attending Note Date: 02/26/2012  Patient name: Jill Hancock  Medical record number: [id number]  Date of birth: 04/24/1942   I have seen and evaluated Jill Hancock and discussed their care with the Residency Team. Please see Dr Waynard Reeds H&P for full details. 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 This episode was different as lasted longer, included N/V, and inability to sit up right. MRI showed at least three areas of subcentimeter acute infarction with one area of subacute infarction in widely disparate vascular territories - Hypertensive CV dz versus embolic. 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 The neurologist who originally saw Jill Hancock felt that an embolic W/U was indicated so we got a TEE arranged but today Dr Pearlean Brownie felt that this was all microvascular dz from HTN and embolic W/U was not indicated.   PMHx, meds, allergies, soc hx, and family hx were reviewed  Filed Vitals:   02/26/12 0308 02/26/12 0527 02/26/12 1039 02/26/12 1453  BP: 124/65 149/91 170/82 173/89  Pulse: 71 73 71 66  Temp: 98.3 F (36.8 C) 98.2 F (36.8 C) 98.2 F (36.8 C) 98.4 F (36.9 C)  TempSrc: Oral Oral Oral Oral  Resp: 20 18 20 20   Height:      Weight:      SpO2: 96% 96% 97% 96%   GEN : NAD.  HRRR no MRG LCTAB anteriorly and laterally ABD : + BS S/NT/ND Neuro : A&O. Speech nl. No facial droop. Cerebellar testing nl. Strength nl.   Labs and imaging reviewed.  Assessment and Plan: I agree with the formulated Assessment and Plan with the following changes:   1. Acute CVA - current neuro opinion is that this is ischemic sm vessel dz and not embolic. Pt to get TTE, carotids (pre-lim nl), and tx with ASA (pt was not on ASA QD prior to admit). TEE has been cancelled and coumadin D/C'd. PT/OT consult  pending.  2. Hyperlipidemia - current LDL is 96. Will start statin for RF mgmt. Lipid Panel     Component Value Date/Time   CHOL 171 02/26/2012 0640   TRIG 125 02/26/2012 0640   HDL 50 02/26/2012 0640   CHOLHDL 3.4 02/26/2012 0640   VLDL 25 02/26/2012 0640   LDLCALC 96 02/26/2012 0640   3. HTN - holding home meds in setting of acute CVA. Needs more aggressive mgmt as outpt. On Hyzaar HCTZ 50-12.5. Had been told to take two QD but only took two occasionally as when she took two she felt dizzy / lightheaded. Could increase the ARB part to 100 and leave HCTZ at 12.5 (comes in 100 - 12.5 pill) once she is stable.      Burns Spain, MD 8/26/20134:29 PM

## 2012-02-26 NOTE — Care Management Note (Signed)
    Page 1 of 1   02/29/2012     1:50:22 PM   CARE MANAGEMENT NOTE 02/29/2012  Patient:  Jill Hancock, Jill Hancock   Account Number:  [id number]  Date Initiated:  02/26/2012  Documentation initiated by:  Onnie Boer  Subjective/Objective Assessment:   PT WAS ADMITTED WITH DIZZINESS AND ? CVA     Action/Plan:   PROGRESSION OF CARE AND DISCHARGE PLANNING   Anticipated DC Date:  02/28/2012   Anticipated DC Plan:  IP REHAB FACILITY      DC Planning Services  CM consult      Choice offered to / List presented to:  C-1 Patient   DME arranged  [facility]      DME agency  [facility]        Status of service:  Completed, signed off Medicare Important Message given?   (If response is "NO", the following Medicare IM given date fields will be blank) Date Medicare IM given:   Date Additional Medicare IM given:    Discharge Disposition:  HOME/SELF CARE  Per UR Regulation:  Reviewed for med. necessity/level of care/duration of stay  If discussed at Long Length of Stay Meetings, dates discussed:    Comments:  02/29/12 Onnie Boer, RN, BSN 1349 PT WAS DC'D TO HOME WITH OP PT AND A ROLLATOR FROM [facility]  02/26/12 Onnie Boer, RN, BSN 1207 PT WAS ADMITTED WITH DIZZINESS AND IS HAVING A CVA W/U. PTA PT WAS AT HOME WITH SELF CARE/ FAMILY CARE.  WILL F/U ON RECOMMENDATIONS AND DC NEEDS.

## 2012-02-26 NOTE — Progress Notes (Signed)
Stroke Team Progress Note  HISTORY   Jill Hancock is an 71 y.o. female who presents with sudden onset vertigo that started yesterday. She states that came on suddenly, and was similar to previous attacks of BPPV, but did not go away after 5 minutes as it typically does. Because it was not resolving, she had her husband call EMS.   On arrival, she had a blood pressure in the 190s. An MRI was attempted yesterday, but the patient was nauseous and vomiting and unable to tolerate an MRI at the time. She did have an MRI today, which confirmed at least three areas (2 left, 1 right) of subcentimeter acute infarction with one area of subacute infarction. These involve widely disparate vascular territories.  She is a Engineer, civil (consulting). She said that she had an episode of dizziness the night prior to admission. She awoke and was fine. She then began having dizziness at 9am (Nausea and vomiting) and called EMS for which she arrived at [facility] at 1032 on 02/24/2012. The same day that her symptoms began again. Not on any antiplatelet agents PTA.  LSN 0700 tPA None  SUBJECTIVE Her husband is at the bedside.  Overall she feels her condition is gradually improving. However, she still feels like the room is spinning today.   OBJECTIVE Most recent Vital Signs: Filed Vitals:   02/26/12 0158 02/26/12 0308 02/26/12 0527 02/26/12 1039  BP: 161/80 124/65 149/91 170/82  Pulse: 65 71 73 71  Temp: 98 F (36.7 C) 98.3 F (36.8 C) 98.2 F (36.8 C) 98.2 F (36.8 C)  TempSrc: Oral Oral Oral Oral  Resp: 20 20 18 20   Height: 5\' 5"  (1.651 m)     Weight: 79.8 kg (175 lb 14.8 oz)     SpO2: 98% 96% 96% 97%   MEDICATIONS    . aspirin  300 mg Rectal Daily   Or  . aspirin  325 mg Oral Daily  . heparin  5,000 Units Subcutaneous Q8H  . DISCONTD: hydrochlorothiazide  12.5 mg Oral Daily  . DISCONTD: losartan  50 mg Oral Daily  . DISCONTD: ondansetron (ZOFRAN) IV  8 mg Intravenous To ER   PRN:  acetaminophen, acetaminophen,  diazepam, meclizine, promethazine, senna-docusate  Diet:  General thin Activity:  Bedrest DVT Prophylaxis:  Heparin SQ  CLINICALLY SIGNIFICANT STUDIES Basic Metabolic Panel:  Lab 02/25/12 8119  NA 139  K 3.4*  CL 103  CO2 26  GLUCOSE 99  BUN 15  CREATININE 0.77  CALCIUM 9.1  MG --  PHOS --   Liver Function Tests:  Lab 02/25/12 1006  AST 24  ALT 21  ALKPHOS 48  BILITOT 0.5  PROT 6.3  ALBUMIN 3.2*   CBC:  Lab 02/25/12 1006  WBC 10.4  NEUTROABS 8.0*  HGB 12.2  HCT 35.7*  MCV 85.4  PLT 275   Coagulation:  Lab 02/25/12 1006  LABPROT 14.3  INR 1.09   Cardiac Enzymes:  Lab 02/25/12 1006  CKTOTAL 386*  CKMB 6.0*  CKMBINDEX --  TROPONINI <0.30   Urinalysis:  Lab 02/24/12 1655  COLORURINE YELLOW  LABSPEC 1.016  PHURINE 7.5  GLUCOSEU NEGATIVE  HGBUR NEGATIVE  BILIRUBINUR NEGATIVE  KETONESUR NEGATIVE  PROTEINUR NEGATIVE  UROBILINOGEN 0.2  NITRITE NEGATIVE  LEUKOCYTESUR NEGATIVE   Lipid Panel    Component Value Date/Time   CHOL 171 02/26/2012 0640   TRIG 125 02/26/2012 0640   HDL 50 02/26/2012 0640   CHOLHDL 3.4 02/26/2012 0640    LDLV 25 02/26/2012 0640  LDLCALC 96 02/26/2012 0640   HgbA1C    Mr Maxine Glenn Head/Brain Wo Contrast 02/25/2012  There are three small acute areas of cerebral infarction, and one subcentimeter area of subacute infarction.  The lesions which are most likely be responsible for vertigo is located in the left cerebellar white matter dorsal to the fourth ventricle (image 7 series 3, approximately 3 x 5 mm), and/or in the left paramedian midbrain (image 16 series 5, 3 x 3 mm).  The third lesion, in a different vascular territory, is seen in the right frontal periventricular white matter just superior to the caudate nucleus, approximately 3 x 4 mm cross-section.  A fourth lesion with low level restricted diffusion is seen in the left occipital subcortical white matter, likely days to a few weeks old.  There is no mass lesion or acute hemorrhage.   There is no hydrocephalus or extra-axial fluid.  The internal carotid arteries are widely patent.  The basilar artery shows mild nonstenotic irregularity just cephalad to the origin of the anterior inferior cerebellar artery. The right vertebral is the dominant contributor to the basilar without focal stenosis.  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  Both posterior cerebral arteries originate from the carotids.  There is no proximal stenosis of the anterior or middle cerebral arteries.  There is moderately severe narrowing of the right PCA in its ambient P2 segment.  There is no visible intracranial aneurysm or dissection. There is no visible cerebellar branch occlusion.  IMPRESSION: Intracranial atherosclerotic changes as described.  Potentially flow limiting stenosis right PCA, not accompanied by acute cerebral infarction however.      Dg Chest Port 1 View 02/25/2012 1.  Mild cardiac enlargement. 2.  No acute pulmonary findings.    2D Echocardiogram    Carotid Doppler    EKG  normal sinus rhythm.   Therapy Recommendations PT - ; OT -; ST -  Physical Exam   Pleasant elderly Caucasian lady currently not in distress.Awake alert. Afebrile. Head is nontraumatic. Neck is supple without bruit. Hearing is normal. Cardiac exam no murmur or gallop. Lungs are clear to auscultation. Distal pulses are well felt.  Neurological Exam :Awake  Alert oriented x 3. Normal speech and language. Fundi not visualized. Vision acuity. and fields appear normal.eye movements full without nystagmus. Face symmetric. Tongue midline. Normal strength, tone, reflexes and coordination. Normal sensation. Gait deferred. Provocative vestibular maneuvers were not done  ASSESSMENT Jill Hancock is a 71 y.o. female presenting with dizziness. Imaging confirms three small acute areas of cerebral infarction, and one subcentimeter area of subacute infarction  infarct. Infarct felt to be small vessel disease secondary to MRI results.  Work  up underway. On none prior to admission. Now on aspirin 325 mg orally every day for secondary stroke prevention. Patient with resultant vertigo.  -hypertension  Hospital day # 2  TREATMENT/PLAN -Continue  aspirin 325 mg orally every day for secondary stroke prevention. -2D Echo, carotid doppler pending -HGB A1C  pending -LDL, 96 -aggressive risk factor management  Guy Franco, PAC,  MBA, MHA [facility] Pager: [phone] 02/26/2012 11:33 AM  Scribe for Dr. Delia Heady, Stroke Center Medical Director. He has personally reviewed chart, pertinent data, examined the patient and developed the plan of care. Pager:  [phone]

## 2012-02-26 NOTE — Progress Notes (Signed)
SLP Cancellation Note  Treatment cancelled today due to patient receiving procedure or tests x 2.  Carotid doppler this am, and Echo this afternoon.    SLP will return 02/27/12.  Maryjo Rochester T 02/26/2012, 4:10 PM

## 2012-02-26 NOTE — Progress Notes (Signed)
OT Cancellation Note  Treatment cancelled today due to patient currently receiving procedure or test: carotid dopplers. Checked on patient earlier this am and asked to come back by MD. Will check again later as schedule allows. Thank you. Jill Hancock, OTR/L Pager: [phone] 02/26/2012     Jill Hancock 02/26/2012, 11:34 AM

## 2012-02-26 NOTE — Progress Notes (Addendum)
Subjective: Ms. Drouillard was seen and examined by bedside.  She is still complaining of vertigo today but denies nausea and vomiting and tolerated diet.  She reports no other complaints at this time other than vertigo and feeling unbalanced.  She denies nausea, vomiting, diarrhea, headaches, chest pain, shortness of breath, or abdominal pain at this time.    Objective: Vital signs in last 24 hours: Filed Vitals:   02/26/12 0109 02/26/12 0158 02/26/12 0308 02/26/12 0527  BP: 161/80 161/80 124/65 149/91  Pulse: 65 65 71 73  Temp: 98 F (36.7 C) 98 F (36.7 C) 98.3 F (36.8 C) 98.2 F (36.8 C)  TempSrc: Oral Oral Oral Oral  Resp: 20 20 20 18   Height:  5\' 5"  (1.651 m)    Weight:  175 lb 14.8 oz (79.8 kg)    SpO2: 98% 98% 96% 96%   Weight change:   Intake/Output Summary (Last 24 hours) at 02/26/12 0857 Last data filed at 02/25/12 1431  Gross per 24 hour  Intake      0 ml  Output      0 ml  Net      0 ml   Physical Exam:  Vitals reviewed.  General: resting in bed, leaning to right side while laying in bed, complaining of vertigo  HEENT: PERRLA, EOMI, no scleral icterus Cardiac: RRR, no rubs, murmurs or gallops  Pulm: clear to auscultation bilaterally, no wheezes, rales, or rhonchi  Abd: soft, nontender, nondistended, +BS present  Ext: warm and well perfused, no pedal edema  Neuro: alert and oriented X3, cranial nerves II-XII grossly intact, strength and sensation to light touch equal in bilateral upper and lower extremities. Unable to assess gait as she did not wish to walk out of bed secondary to feeling unsteady again, finger to nose slightly less on left but paced improved since admission, good heel to shin b/l, good to thumb to fingers touch.    Lab Results: Basic Metabolic Panel:  Lab 02/25/12 1610  NA 139  K 3.4*  CL 103  CO2 26  GLUCOSE 99  BUN 15  CREATININE 0.77  CALCIUM 9.1  MG --  PHOS --   Liver Function Tests:  Lab 02/25/12 1006  AST 24  ALT 21    ALKPHOS 48  BILITOT 0.5  PROT 6.3  ALBUMIN 3.2*   CBC:  Lab 02/25/12 1006  WBC 10.4  NEUTROABS 8.0*  HGB 12.2  HCT 35.7*  MCV 85.4  PLT 275   Cardiac Enzymes:  Lab 02/25/12 1006  CKTOTAL 386*  CKMB 6.0*  CKMBINDEX --  TROPONINI <0.30   Fasting Lipid Panel:  Lab 02/26/12 0640  CHOL 171  HDL 50  LDLCALC 96  TRIG 125  CHOLHDL 3.4  LDLDIRECT --   Coagulation:  Lab 02/25/12 1006  LABPROT 14.3  INR 1.09   Urinalysis:  Lab 02/24/12 1655  COLORURINE YELLOW  LABSPEC 1.016  PHURINE 7.5  GLUCOSEU NEGATIVE  HGBUR NEGATIVE  BILIRUBINUR NEGATIVE  KETONESUR NEGATIVE  PROTEINUR NEGATIVE  UROBILINOGEN 0.2  NITRITE NEGATIVE  LEUKOCYTESUR NEGATIVE   Studies/Results: Mr Shirlee Latch Wo Contrast  02/25/2012  *RADIOLOGY REPORT*  Clinical Data:  Hypertension.  Sudden onset of nausea, vomiting, and dizziness.  History of vertigo.  MRI HEAD WITHOUT CONTRAST MRA HEAD WITHOUT CONTRAST  Technique:  Multiplanar, multiecho pulse sequences of the brain and surrounding structures were obtained without intravenous contrast. Angiographic images of the head were obtained using MRA technique without contrast.  Comparison:   None  MRI HEAD  Findings:   There are three small acute areas of cerebral infarction, and one subcentimeter area of subacute infarction.  The lesions which are most likely be responsible for vertigo is located in the left cerebellar white matter dorsal to the fourth ventricle (image 7 series 3, approximately 3 x 5 mm), and/or in the left paramedian midbrain (image 16 series 5, 3 x 3 mm).  The third lesion, in a different vascular territory, is seen in the right frontal periventricular white matter just superior to the caudate nucleus, approximately 3 x 4 mm cross-section.  A fourth lesion with low level restricted diffusion is seen in the left occipital subcortical white matter, likely days to a few weeks old.  There is no mass lesion or acute hemorrhage.  There is no  hydrocephalus or extra-axial fluid.  Gradient echo images, sensitive to chronic hemorrhage, show numerous supratentorial and infratentorial areas of subcentimeter chronic hemorrhage.  These are consistent with longstanding cerebrovascular disease secondary hypertension (microbleeds).  There is mild to moderate age related atrophy.  Numerous chronic lacunar infarcts are seen in the centrum semiovale.  There is extensive chronic microvascular ischemic change affecting primarily the supratentorial subcortical and periventricular white matter, also likely sequelae of hypertensive cerebrovascular disease.  No midline shift.  Normal pituitary and cerebellar tonsils. Unremarkable skull base and calvarium.  No acute sinus or mastoid disease.  IMPRESSION: At least three areas of subcentimeter acute infarction with one area of subacute infarction.  These involve widely disparate vascular territories. 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  MRA HEAD  Findings: The internal carotid arteries are widely patent.  The basilar artery shows mild nonstenotic irregularity just cephalad to the origin of the anterior inferior cerebellar artery. The right vertebral is the dominant contributor to the basilar without focal stenosis.  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  Both posterior cerebral arteries originate from the carotids.  There is no proximal stenosis of the anterior or middle cerebral arteries.  There is moderately severe narrowing of the right PCA in its ambient P2 segment.  There is no visible intracranial aneurysm or dissection. There is no visible cerebellar branch occlusion.  IMPRESSION: Intracranial atherosclerotic changes as described.  Potentially flow limiting stenosis right PCA, not accompanied by acute  cerebral infarction however.   Original Report Authenticated By: Elsie Stain, M.D.    Mr Brain Wo Contrast  02/25/2012  *RADIOLOGY REPORT*  Clinical Data:  Hypertension.  Sudden onset of nausea, vomiting, and dizziness.  History of vertigo.  MRI HEAD WITHOUT CONTRAST MRA HEAD WITHOUT CONTRAST  Technique:  Multiplanar, multiecho pulse sequences of the brain and surrounding structures were obtained without intravenous contrast. Angiographic images of the head were obtained using MRA technique without contrast.  Comparison:   None  MRI HEAD  Findings:   There are three small acute areas of cerebral infarction, and one subcentimeter area of subacute infarction.  The lesions which are most likely be responsible for vertigo is located in the left cerebellar white matter dorsal to the fourth ventricle (image 7 series 3, approximately 3 x 5 mm), and/or in the left paramedian midbrain (image 16 series 5, 3 x 3 mm).  The third lesion, in a different vascular territory, is seen in the right frontal periventricular white matter just superior to the caudate nucleus, approximately 3 x 4 mm cross-section.  A fourth lesion with low level restricted diffusion is seen in the left occipital subcortical white matter, likely days to a few weeks old.  There is no mass lesion or acute hemorrhage.  There is no hydrocephalus or extra-axial fluid.  Gradient echo images, sensitive to chronic hemorrhage, show numerous supratentorial and infratentorial areas of subcentimeter chronic hemorrhage.  These are consistent with longstanding cerebrovascular disease secondary hypertension (microbleeds).  There is mild to moderate age related atrophy.  Numerous chronic lacunar infarcts are seen in the centrum semiovale.  There is extensive chronic microvascular ischemic change affecting primarily the supratentorial subcortical and periventricular white matter, also likely sequelae of hypertensive cerebrovascular disease.  No midline shift.  Normal  pituitary and cerebellar tonsils. Unremarkable skull base and calvarium.  No acute sinus or mastoid disease.  IMPRESSION: At least three areas of subcentimeter acute infarction with one area of subacute infarction.  These involve widely disparate vascular territories. 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  MRA HEAD  Findings: The internal carotid arteries are widely patent.  The basilar artery shows mild nonstenotic irregularity just cephalad to the origin of the anterior inferior cerebellar artery. The right vertebral is the dominant contributor to the basilar without focal stenosis.  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  Both posterior cerebral arteries originate from the carotids.  There is no proximal stenosis of the anterior or middle cerebral arteries.  There is moderately severe narrowing of the right PCA in its ambient P2 segment.  There is no visible intracranial aneurysm or dissection. There is no visible cerebellar branch occlusion.  IMPRESSION: Intracranial atherosclerotic changes as described.  Potentially flow limiting stenosis right PCA, not accompanied by acute cerebral infarction however.   Original Report Authenticated By: Elsie Stain, M.D.    Dg Chest Port 1 View  02/25/2012  *RADIOLOGY REPORT*  Clinical Data: CVA.  PORTABLE CHEST - 1 VIEW  Comparison: None.  Findings: The heart is borderline enlarged.  There is tortuosity and mild ectasia of the thoracic aorta.  The lungs are clear.  No pleural effusion.  The bony thorax is intact.  IMPRESSION:  1.  Mild cardiac enlargement. 2.  No acute pulmonary findings.   Original Report Authenticated By: P. Loralie Champagne, M.D.    Medications: I have reviewed the patient's current medications. Scheduled Meds:   . aspirin  300 mg  Rectal Daily   Or  . aspirin  325 mg Oral Daily  . heparin  5,000 Units Subcutaneous Q8H  . DISCONTD: hydrochlorothiazide  12.5 mg Oral Daily  . DISCONTD: losartan  50 mg Oral Daily  . DISCONTD: ondansetron (ZOFRAN) IV  8 mg Intravenous To ER   Continuous Infusions:  PRN Meds:.acetaminophen, acetaminophen, diazepam, meclizine, promethazine, senna-docusate  Assessment/Plan: 71 year old woman with past medical history significant for HTN, vertigo for 12-14 years comes to the ER with complaints of vertigo, nausea and vomiting x3 days. She presented to the ED 02/24/12 and had MRI today that confirmed the diagnosis of acute ischemic stroke.   1.Acute ischemic stroke: Presented with symptoms of vertigo , nausea and vomiting. On our exam ,she is leaning towards right with ? left abnormal finger to nose. She had MRI that showed three small areas of acute cerebral infarction and an area in the territory of left cerebellum and around right caudate nucleus. Given the distribution of her  stroke, we are suspicious towards stroke being secondary to small vessel disease vs. embolic in origin. She used to take ASA off and on at home.  - Continue Aspirin 325mg  PO QD for secondary stroke prevention as per neuro - Neuro checks  - f/u 2D echo report pending - Carotid dopplers--prelim report no ICA stenosis, vertebral artery flow is antegrade - Risk stratification - AIC 5.9: , Lipid panel: Chol 171, LDL: 96, TG:125, HDL:50  - Hold antihypertensive agents to allow permissive HTN.  - Symptomatic treatment of vertigo with PRN valium, meclizine  - Phenergan for nausea.  - Neuro following: continue ASA, aggressive risk factor management  2. HTN: Given Cozaar 50mg  x1 in ED. Hx of uncontrolled HTN with Hyzaar 50-12.5mg  at home  - Hold all antihypertensive medications for now with MRI confirming the acute stroke.  -monitor vitals   3. Hypokalemia: likely 2/2 diuretics.  - check Mg.  - replete   DVT: heparin    Diet: heart healthy  Dispo: pending clinical improvement, neuro recommendations.    LOS: 2 days   Jill Hancock 02/26/2012, 8:57 AM

## 2012-02-26 NOTE — Evaluation (Signed)
Physical Therapy Evaluation Patient Details Name: Jill Hancock MRN: [id number] DOB: 04/24/1942 Today's Date: 02/26/2012 Time: 1416-1500 PT Time Calculation (min): 44 min  PT Assessment / Plan / Recommendation Clinical Impression  Pt adm with multiple cerebral and Lt cerebellar infarcts (some acute and some subacute) with deficits in mobility due to decr balance, dizziness/vertigo, nausea, and anxiety. Pt with h/o BPPV with some symptoms ?able for BPPV, however pt too anxious re: increasing intensity of symptoms for BPPV testing. Clearly the cerebellar infarct is impacting her balance and sense of dysequilibrium (pt describes a feeling of floating and at times drifting/falling to the Rt; less often vertigo/spinning--when she lies down or rolls over). Will benefit from PT to maximize safety prior to d/c home with family.    PT Assessment  Patient needs continued PT services    Follow Up Recommendations  Outpatient PT;Supervision for mobility/OOB    Barriers to Discharge None      Equipment Recommendations  Rolling walker with 5" wheels    Recommendations for Other Services     Frequency Min 4X/week    Precautions / Restrictions Precautions Precautions: Fall Restrictions Weight Bearing Restrictions: No   Pertinent Vitals/Pain None       Mobility  Bed Mobility Bed Mobility: Sit to Sidelying Right;Rolling Left Rolling Left: 5: Supervision;With rail Sit to Sidelying Right: 4: Min guard;With rail;HOB elevated (HOB 15 (pt could not tolerate 0 due to anxiety/dizzy)) Details for Bed Mobility Assistance: cues for moving head/eyes separately (gaze stabilization) Transfers Transfers: Sit to Stand;Stand to Sit Sit to Stand: 4: Min assist;With upper extremity assist;From chair/3-in-1;With armrests;From toilet Stand to Sit: 4: Min assist;With upper extremity assist;To bed;To toilet Details for Transfer Assistance: Pt reports feeling she leans/drifts to her Rt; min assist for safety;  cues for safe use of RW Ambulation/Gait Ambulation/Gait Assistance: 4: Min assist Ambulation Distance (Feet): 30 Feet (12 ft; 18 ft) Assistive device: Rolling walker Ambulation/Gait Assistance Details: Pt instructed in use of gaze stabilization to control symptoms of dizziness and nausea; instructed to turn head and eyes to find a target and then turn her body; pt reports that this did help with symptom management (max dizziness 7-8/10 and nausea 3/10) Gait Pattern: Step-through pattern Gait velocity: significantly decr Modified Rankin (Stroke Patients Only) Pre-Morbid Rankin Score: No symptoms Modified Rankin: Moderately severe disability    Exercises     PT Diagnosis: Difficulty walking;Abnormality of gait;Other (comment) (vertigo)  PT Problem List: Decreased activity tolerance;Decreased balance;Decreased mobility;Decreased knowledge of use of DME;Other (comment) (vertigo) PT Treatment Interventions: DME instruction;Gait training;Stair training;Functional mobility training;Therapeutic activities;Therapeutic exercise;Balance training;Neuromuscular re-education;Patient/family education;Other (comment) (gaze stabilization; BPPV testing/treatment)   PT Goals Acute Rehab PT Goals PT Goal Formulation: With patient/family Time For Goal Achievement: 03/04/12 Potential to Achieve Goals: Good Pt will Roll Supine to Left Side: with modified independence;Other (comment) (with dizziness <4/10; pt utilizing gaze stabilization) PT Goal: Rolling Supine to Left Side - Progress: Goal set today Pt will go Supine/Side to Sit: with modified independence;with HOB 0 degrees;Other (comment) (dizziness < 4/10) PT Goal: Supine/Side to Sit - Progress: Goal set today Pt will go Sit to Supine/Side: with modified independence;with HOB 0 degrees PT Goal: Sit to Supine/Side - Progress: Goal set today Pt will go Sit to Stand: with modified independence;with upper extremity assist;Other (comment) (dizziness < 4/10) PT  Goal: Sit to Stand - Progress: Goal set today Pt will Ambulate: 51 - 150 feet;with supervision;with least restrictive assistive device;Other (comment) (dizzy < 4/10; using gaze stabilization tech) PT Goal:  Ambulate - Progress: Goal set today Pt will Go Up / Down Stairs: 3-5 stairs;with min assist;with least restrictive assistive device PT Goal: Up/Down Stairs - Progress: Goal set today Additional Goals Additional Goal #1: Pt able to independently utilize gaze stabilization techniques to decr symptoms PT Goal: Additional Goal #1 - Progress: Goal set today Additional Goal #2: Pt able to perform x 1 exercises in horizontal and vertical planes in sititng x 30 seconds with dizziness < 7/10 PT Goal: Additional Goal #2 - Progress: Goal set today  Visit Information  Last PT Received On: 02/26/12 Assistance Needed: +1    Subjective Data  Subjective: Reports this episode feels worse than any other vertigo episodes she's had. 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 Communication Communication:  (occasional word-finding diff (unsure if just anxious?)) Dominant Hand: Right    Cognition  Overall Cognitive Status: Appears within functional limits for tasks assessed/performed Arousal/Alertness: Awake/alert Orientation Level: Appears intact for tasks assessed Behavior During Session: Anxious (slightly; increases with dizziness/nausea)    Extremity/Trunk Assessment Right Lower  Extremity Assessment RLE ROM/Strength/Tone: [employer] for tasks assessed Left Lower Extremity Assessment LLE ROM/Strength/Tone: [facility] for tasks assessed   Balance Balance Balance Assessed: Yes Static Sitting Balance Static Sitting - Balance Support: Bilateral upper extremity supported;Feet supported Static Sitting - Level of Assistance: 5: Stand by assistance Static Standing Balance Static Standing - Balance Support: Bilateral upper extremity supported Static Standing - Level of Assistance: 4: Min assist Static Standing - Comment/# of Minutes: incr dizziness and nausea with transitional movements  End of Session PT - End of Session Equipment Utilized During Treatment: Gait belt Activity Tolerance: Other (comment) (limited by dizziness and anxiety (fear of incr dizz/nausea)) Patient left: in bed;with family/visitor present (husband with call bell with pt's consent) Nurse Communication: Mobility status;Other (comment) (request for meclizine vs phenergan)  GP     Scarlettrose Costilow 02/26/2012, 3:33 PM  Pager [phone]

## 2012-02-26 NOTE — Progress Notes (Signed)
VASCULAR LAB PRELIMINARY  PRELIMINARY  PRELIMINARY  PRELIMINARY  Carotid Dopplers completed.    Preliminary report:  No ICA stenosis.  Vertebral artery flow is antegrade. Shey Yott, 02/26/2012, 11:56 AM

## 2012-02-26 NOTE — Progress Notes (Signed)
  Echocardiogram 2D Echocardiogram has been performed.  Georgian Co 02/26/2012, 4:25 PM

## 2012-02-27 ENCOUNTER — Encounter (HOSPITAL_COMMUNITY)
Admission: EM | Disposition: A | Discharge status: Home / Self Care | Payer: Self-pay | Source: Home / Self Care | Attending: Internal Medicine

## 2012-02-27 DIAGNOSIS — R739 Hyperglycemia, unspecified: Secondary | ICD-10-CM | POA: Diagnosis present

## 2012-02-27 DIAGNOSIS — I10 Essential (primary) hypertension: Secondary | ICD-10-CM | POA: Diagnosis present

## 2012-02-27 LAB — RAPID URINE DRUG SCREEN, HOSP PERFORMED
Barbiturates: NOT DETECTED
Tetrahydrocannabinol: NOT DETECTED

## 2012-02-27 LAB — CBC
MCH: 29.5 pg (ref 26.0–34.0)
MCV: 84.8 fL (ref 78.0–100.0)
Platelets: 288 10*3/uL (ref 150–400)
RBC: 4.61 MIL/uL (ref 3.87–5.11)
RDW: 13.6 % (ref 11.5–15.5)

## 2012-02-27 LAB — BASIC METABOLIC PANEL
Calcium: 9.4 mg/dL (ref 8.4–10.5)
Calcium: 9.6 mg/dL (ref 8.4–10.5)
Chloride: 102 mEq/L (ref 96–112)
Creatinine, Ser: 0.68 mg/dL (ref 0.50–1.10)
Creatinine, Ser: 0.73 mg/dL (ref 0.50–1.10)
GFR calc Af Amer: >90 mL/min (ref >90)
GFR calc Af Amer: >90 mL/min (ref >90)
Glucose, Bld: 120 mg/dL — ABNORMAL HIGH (ref 70–99)

## 2012-02-27 LAB — MAGNESIUM: Magnesium: 1.9 mg/dL (ref 1.5–2.5)

## 2012-02-27 LAB — GLUCOSE, CAPILLARY: Glucose-Capillary: 106 mg/dL — ABNORMAL HIGH (ref 70–99)

## 2012-02-27 SURGERY — ECHOCARDIOGRAM, TRANSESOPHAGEAL
Anesthesia: Moderate Sedation

## 2012-02-27 MED ORDER — POTASSIUM CHLORIDE CRYS ER 20 MEQ PO TBCR: 40.0000 meq | EXTENDED_RELEASE_TABLET | Freq: Two times a day (BID) | ORAL | Status: DC

## 2012-02-27 MED ORDER — AMLODIPINE BESYLATE 5 MG PO TABS
5.0000 mg | ORAL_TABLET | Freq: Every day | ORAL | Status: DC
  Administered 2012-02-27: 5 mg via ORAL
  Filled 2012-02-27 (×2): qty 1

## 2012-02-27 MED ORDER — METFORMIN HCL 500 MG PO TABS
500.0000 mg | ORAL_TABLET | Freq: Every day | ORAL | Status: DC
  Administered 2012-02-27 – 2012-02-29 (×3): 500 mg via ORAL
  Filled 2012-02-27 (×5): qty 1

## 2012-02-27 MED ORDER — SIMVASTATIN 10 MG PO TABS
10.0000 mg | ORAL_TABLET | Freq: Every day | ORAL | Status: DC
  Administered 2012-02-27 – 2012-02-28 (×2): 10 mg via ORAL
  Filled 2012-02-27 (×3): qty 1

## 2012-02-27 MED ORDER — SIMVASTATIN 20 MG PO TABS
20.0000 mg | ORAL_TABLET | Freq: Every day | ORAL | Status: DC
  Filled 2012-02-27: qty 1

## 2012-02-27 MED ORDER — HYDROCHLOROTHIAZIDE 12.5 MG PO CAPS
12.5000 mg | ORAL_CAPSULE | Freq: Every day | ORAL | Status: DC
  Administered 2012-02-27: 12.5 mg via ORAL
  Filled 2012-02-27 (×3): qty 1

## 2012-02-27 MED ORDER — LIDOCAINE 5 % EX PTCH
1.0000 | MEDICATED_PATCH | CUTANEOUS | Status: AC
  Administered 2012-02-27: 1 via TRANSDERMAL
  Filled 2012-02-27: qty 1

## 2012-02-27 MED ORDER — POTASSIUM CHLORIDE CRYS ER 20 MEQ PO TBCR
40.0000 meq | EXTENDED_RELEASE_TABLET | Freq: Four times a day (QID) | ORAL | Status: AC
  Administered 2012-02-27 (×2): 40 meq via ORAL
  Filled 2012-02-27 (×4): qty 2

## 2012-02-27 NOTE — Progress Notes (Signed)
Stroke Team Progress Note  HISTORY   Jill Hancock is an 71 y.o. female who presents with sudden onset vertigo that started yesterday. She states that came on suddenly, and was similar to previous attacks of BPPV, but did not go away after 5 minutes as it typically does. Because it was not resolving, she had her husband call EMS.   On arrival, she had a blood pressure in the 190s. An MRI was attempted yesterday, but the patient was nauseous and vomiting and unable to tolerate an MRI at the time. She did have an MRI today, which confirmed at least three areas (2 left, 1 right) of subcentimeter acute infarction with one area of subacute infarction. These involve widely disparate vascular territories.  She is a Engineer, civil (consulting). She said that she had an episode of dizziness the night prior to admission. She awoke and was fine. She then began having dizziness at 9am (Nausea and vomiting) and called EMS for which she arrived at [facility] at 1032 on 02/24/2012. The same day that her symptoms began again. Not on any antiplatelet agents PTA.  LSN 0700 tPA None  SUBJECTIVE Her husband is at the bedside.  Overall she feels her condition is gradually improving. However, she still feels like the room is spinning today. She is working with OT now and I am told that her gait is still unsteady; however she can perform ADLs otherwise. She does feel that antivert does help with her dizziness.  OBJECTIVE Most recent Vital Signs: Filed Vitals:   02/26/12 2329 02/27/12 0048 02/27/12 0559 02/27/12 0932  BP: 178/97 176/100 164/78 166/85  Pulse:   70 75  Temp:   97.9 F (36.6 C) 97.8 F (36.6 C)  TempSrc:   Oral Oral  Resp:    16  Height:      Weight:      SpO2:   100% 98%   MEDICATIONS     . aspirin  300 mg Rectal Daily   Or  . aspirin  325 mg Oral Daily  . heparin  5,000 Units Subcutaneous Q8H  . lidocaine  1 patch Transdermal Q24H  . lidocaine  1 patch Transdermal Q24H   PRN:  acetaminophen, acetaminophen,  diazepam, meclizine, promethazine, senna-docusate, DISCONTD: promethazine  Diet:  General thin Activity:  Fall precautions DVT Prophylaxis:  Heparin SQ  CLINICALLY SIGNIFICANT STUDIES Basic Metabolic Panel:   Lab 02/27/12 0844 02/27/12 0641  NA 141 145  K 3.1* 2.8*  CL 102 105  CO2 30 28  GLUCOSE 120* 105*  BUN 12 12  CREATININE 0.73 0.68  CALCIUM 9.6 9.4  MG -- 1.9  PHOS -- --   Liver Function Tests:   Lab 02/25/12 1006  AST 24  ALT 21  ALKPHOS 48  BILITOT 0.5  PROT 6.3  ALBUMIN 3.2*   CBC:   Lab 02/27/12 0641 02/25/12 1006  WBC 10.7* 10.4  NEUTROABS -- 8.0*  HGB 13.6 12.2  HCT 39.1 35.7*  MCV 84.8 85.4  PLT 288 275   Coagulation:   Lab 02/25/12 1006  LABPROT 14.3  INR 1.09   Cardiac Enzymes:   Lab 02/25/12 1006  CKTOTAL 386*  CKMB 6.0*  CKMBINDEX --  TROPONINI <0.30   Urinalysis:   Lab 02/24/12 1655  COLORURINE YELLOW  LABSPEC 1.016  PHURINE 7.5  GLUCOSEU NEGATIVE  HGBUR NEGATIVE  BILIRUBINUR NEGATIVE  KETONESUR NEGATIVE  PROTEINUR NEGATIVE  UROBILINOGEN 0.2  NITRITE NEGATIVE  LEUKOCYTESUR NEGATIVE   Lipid Panel    Component  Value Date/Time   CHOL 171 02/26/2012 0640   TRIG 125 02/26/2012 0640   HDL 50 02/26/2012 0640   CHOLHDL 3.4 02/26/2012 0640    LDLV 25 02/26/2012 0640   LDLCALC 96 02/26/2012 0640   HgbA1C 5.9   Mr Maxine Glenn Head/Brain Wo Contrast 02/25/2012  There are three small acute areas of cerebral infarction, and one subcentimeter area of subacute infarction.  The lesions which are most likely be responsible for vertigo is located in the left cerebellar white matter dorsal to the fourth ventricle (image 7 series 3, approximately 3 x 5 mm), and/or in the left paramedian midbrain (image 16 series 5, 3 x 3 mm).  The third lesion, in a different vascular territory, is seen in the right frontal periventricular white matter just superior to the caudate nucleus, approximately 3 x 4 mm cross-section.  A fourth lesion with low level  restricted diffusion is seen in the left occipital subcortical white matter, likely days to a few weeks old.  There is no mass lesion or acute hemorrhage.  There is no hydrocephalus or extra-axial fluid.  The internal carotid arteries are widely patent.  The basilar artery shows mild nonstenotic irregularity just cephalad to the origin of the anterior inferior cerebellar artery. The right vertebral is the dominant contributor to the basilar without focal stenosis.  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  Both posterior cerebral arteries originate from the carotids.  There is no proximal stenosis of the anterior or middle cerebral arteries.  There is moderately severe narrowing of the right PCA in its ambient P2 segment.  There is no visible intracranial aneurysm or dissection. There is no visible cerebellar branch occlusion.  IMPRESSION: Intracranial atherosclerotic changes as described.  Potentially flow limiting stenosis right PCA, not accompanied by acute cerebral infarction however.      Dg Chest Port 1 View 02/25/2012 1.  Mild cardiac enlargement. 2.  No acute pulmonary findings.    2D Echocardiogram  No cardiac source of emboli was identified, LVEF 55%  Carotid Doppler  No ICA stenosis  EKG  normal sinus rhythm.   Therapy Recommendations PT/OT outpatient vestibular therapy; ST -  Physical Exam   Pleasant elderly Caucasian lady currently not in distress.Awake alert. Afebrile. Head is nontraumatic. Neck is supple without bruit. Hearing is normal. Cardiac exam no murmur or gallop. Lungs are clear to auscultation. Distal pulses are well felt.  Neurological Exam :Awake  Alert oriented x 3. Normal speech and language. Fundi not visualized. Vision acuity. and fields appear normal.eye movements full without nystagmus. Face symmetric. Tongue midline. Normal strength, tone, reflexes and coordination. Normal sensation. Gait deferred. Provocative vestibular maneuvers were not done    ASSESSMENT Jill Hancock is a 71 y.o. female presenting with dizziness. Imaging confirms three small acute areas of cerebral infarction, and one subcentimeter area of subacute infarction infarct. Infarct felt to be small vessel disease by review of MRI results (left cerebellar, midbrain, occipital and right frontal).  Work up underway. On no anticoagulants prior to admission. Now on aspirin 325 mg orally every day for secondary stroke prevention. Patient with resultant vertigo.  -hypertension -hypokalemia  Hospital day # 3  TREATMENT/PLAN -Continue  aspirin 325 mg orally every day for secondary stroke prevention. -Aggressive risk factor management -Hypokalemia, per primary team -Outpatient vestibular rehab -Followup to see Dr. Pearlean Brownie in 2 mos.   Guy Franco, [facility],  MBA, MHA [facility] Pager: [phone] 02/27/2012 9:55 AM  Scribe for Dr. Delia Heady, Stroke Center  Medical Director. He has personally reviewed chart, pertinent data, examined the patient and developed the plan of care. Pager:  [phone]

## 2012-02-27 NOTE — Discharge Summary (Signed)
Internal Medicine Teaching [facility] Discharge Note  Name: Jill Hancock MRN: [id number] DOB: 04/24/1942 71 y.o.  Date of Admission: 02/24/2012 10:32 AM Date of Discharge: 02/29/2012 Attending Physician: Burns Spain, MD  Discharge Diagnosis: Principal Problem:  *Multiple cerebral infarctions Active Problems:  Vertigo  Unsteady gait  Hypertension  Hyperglycemia  Nausea & vomiting  Discharge Medications: Medication List  As of 02/29/2012  1:49 PM   STOP taking these medications         losartan-hydrochlorothiazide 50-12.5 MG per tablet      POTASSIUM PO         TAKE these medications         aspirin 325 MG tablet   Take 1 tablet (325 mg total) by mouth daily.      diazepam 2 MG tablet   Commonly known as: VALIUM   Take 1 mg by mouth daily as needed. For dizziness.      esomeprazole 20 MG capsule   Commonly known as: NEXIUM   Take 1 capsule (20 mg total) by mouth daily before breakfast.      lidocaine 5 %   Commonly known as: LIDODERM   Place 1 patch onto the skin daily. Remove & Discard patch within 12 hours or as directed by MD      lisinopril 5 MG tablet   Commonly known as: PRINIVIL,ZESTRIL   Take 1 tablet (5 mg total) by mouth daily.      meclizine 25 MG tablet   Commonly known as: ANTIVERT   Take 1 tablet (25 mg total) by mouth 3 (three) times daily as needed. For vertigo.      metFORMIN 500 MG tablet   Commonly known as: GLUCOPHAGE   Take 1 tablet (500 mg total) by mouth daily with breakfast.      potassium chloride 10 MEQ tablet   Commonly known as: K-DUR   Take 2 tablets (20 mEq total) by mouth 2 (two) times daily.      pravastatin 20 MG tablet   Commonly known as: PRAVACHOL   Take 1 tablet (20 mg total) by mouth daily.           Disposition and follow-up:   Ms.Yen S Lippmann was discharged from [facility] in Stable condition.  At the hospital follow up visit please address vertigo, balance, PT,  electrolytes, BP, and blood sugar along with medications.   Follow-up Appointments: Follow-up Information    Follow up with [facility] on 03/13/2012. (f/u in [name] with Dr. Virgina Organ at 330pm on 03/13/12)    Contact information:   [address] [place] [address] [phone]      Follow up with Gates Rigg, MD. Schedule an appointment as soon as possible for a visit in 2 months.   Contact information:   [address], [address] [facility] [place] [address] [phone]       Follow up with [facility] on 03/07/2012. (@ 10am blood draw cbc, bmp, blood sugar check, and bp check)    Contact information:   [address] [place] [address] [phone]        Discharge Orders    Future Appointments: Provider: Department: Dept Phone: Center:   03/07/2012 10:00 AM [facility] [phone] [place]   03/13/2012 3:30 PM Darden Palmer, MD [facility] [phone] [facility]     Future Orders Please Complete By Expires  Diet - low sodium heart healthy      Increase activity slowly      Call MD for:  persistant dizziness or light-headedness      Call MD for:  persistant nausea and vomiting      Call MD for:  difficulty breathing, headache or visual disturbances      Discharge instructions      Comments:   Please follow up with outpatient physical therapy--they will determine the frequency with which you need  Please return to [facility] in one week for labwork done and bp check--cbc/bmp/bp check/glucose check--Appointment made for 03/07/12 at 10am  Please return to [facility] with Dr. Virgina Organ for follow up appointment 03/13/12--consider outpatient DEXA scan  Please continue to take Lisinopril, metformin, potassium, pravastatin as prescribed  Follow up with Dr. Pearlean Brownie in 2 months--neurology     Consultations: Neurology  Procedures Performed:  Mr Maxine Glenn Head Wo Contrast  02/25/2012  *RADIOLOGY  REPORT*  Clinical Data:  Hypertension.  Sudden onset of nausea, vomiting, and dizziness.  History of vertigo.  MRI HEAD WITHOUT CONTRAST MRA HEAD WITHOUT CONTRAST  Technique:  Multiplanar, multiecho pulse sequences of the brain and surrounding structures were obtained without intravenous contrast. Angiographic images of the head were obtained using MRA technique without contrast.  Comparison:   None  MRI HEAD  Findings:   There are three small acute areas of cerebral infarction, and one subcentimeter area of subacute infarction.  The lesions which are most likely be responsible for vertigo is located in the left cerebellar white matter dorsal to the fourth ventricle (image 7 series 3, approximately 3 x 5 mm), and/or in the left paramedian midbrain (image 16 series 5, 3 x 3 mm).  The third lesion, in a different vascular territory, is seen in the right frontal periventricular white matter just superior to the caudate nucleus, approximately 3 x 4 mm cross-section.  A fourth lesion with low level restricted diffusion is seen in the left occipital subcortical white matter, likely days to a few weeks old.  There is no mass lesion or acute hemorrhage.  There is no hydrocephalus or extra-axial fluid.  Gradient echo images, sensitive to chronic hemorrhage, show numerous supratentorial and infratentorial areas of subcentimeter chronic hemorrhage.  These are consistent with longstanding cerebrovascular disease secondary hypertension (microbleeds).  There is mild to moderate age related atrophy.  Numerous chronic lacunar infarcts are seen in the centrum semiovale.  There is extensive chronic microvascular ischemic change affecting primarily the supratentorial subcortical and periventricular white matter, also likely sequelae of hypertensive cerebrovascular disease.  No midline shift.  Normal pituitary and cerebellar tonsils. Unremarkable skull base and calvarium.  No acute sinus or mastoid disease.  IMPRESSION: At least three  areas of subcentimeter acute infarction with one area of subacute infarction.  These involve widely disparate vascular territories. 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  MRA HEAD  Findings: The internal carotid arteries are widely patent.  The basilar artery shows mild nonstenotic irregularity just cephalad to the origin of the anterior inferior cerebellar artery. The right vertebral is the dominant contributor to the basilar without focal stenosis.  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  Both posterior cerebral arteries originate from the carotids.  There is no proximal stenosis of the anterior or middle cerebral arteries.  There is moderately severe narrowing of the right PCA in  its ambient P2 segment.  There is no visible intracranial aneurysm or dissection. There is no visible cerebellar branch occlusion.  IMPRESSION: Intracranial atherosclerotic changes as described.  Potentially flow limiting stenosis right PCA, not accompanied by acute cerebral infarction however.   Original Report Authenticated By: Elsie Stain, M.D.    Mr Brain Wo Contrast  02/25/2012  *RADIOLOGY REPORT*  Clinical Data:  Hypertension.  Sudden onset of nausea, vomiting, and dizziness.  History of vertigo.  MRI HEAD WITHOUT CONTRAST MRA HEAD WITHOUT CONTRAST  Technique:  Multiplanar, multiecho pulse sequences of the brain and surrounding structures were obtained without intravenous contrast. Angiographic images of the head were obtained using MRA technique without contrast.  Comparison:   None  MRI HEAD  Findings:   There are three small acute areas of cerebral infarction, and one subcentimeter area of subacute infarction.  The lesions which are most likely be responsible for vertigo is located in the left cerebellar white  matter dorsal to the fourth ventricle (image 7 series 3, approximately 3 x 5 mm), and/or in the left paramedian midbrain (image 16 series 5, 3 x 3 mm).  The third lesion, in a different vascular territory, is seen in the right frontal periventricular white matter just superior to the caudate nucleus, approximately 3 x 4 mm cross-section.  A fourth lesion with low level restricted diffusion is seen in the left occipital subcortical white matter, likely days to a few weeks old.  There is no mass lesion or acute hemorrhage.  There is no hydrocephalus or extra-axial fluid.  Gradient echo images, sensitive to chronic hemorrhage, show numerous supratentorial and infratentorial areas of subcentimeter chronic hemorrhage.  These are consistent with longstanding cerebrovascular disease secondary hypertension (microbleeds).  There is mild to moderate age related atrophy.  Numerous chronic lacunar infarcts are seen in the centrum semiovale.  There is extensive chronic microvascular ischemic change affecting primarily the supratentorial subcortical and periventricular white matter, also likely sequelae of hypertensive cerebrovascular disease.  No midline shift.  Normal pituitary and cerebellar tonsils. Unremarkable skull base and calvarium.  No acute sinus or mastoid disease.  IMPRESSION: At least three areas of subcentimeter acute infarction with one area of subacute infarction.  These involve widely disparate vascular territories. 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  MRA HEAD  Findings: The internal carotid arteries are widely patent.  The basilar artery shows mild nonstenotic irregularity just cephalad to the origin of the anterior inferior cerebellar artery. The right vertebral is the dominant  contributor to the basilar without focal stenosis.  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  Both posterior cerebral arteries originate from the carotids.  There is no proximal stenosis of the anterior or middle cerebral arteries.  There is moderately severe narrowing of the right PCA in its ambient P2 segment.  There is no visible intracranial aneurysm or dissection. There is no visible cerebellar branch occlusion.  IMPRESSION: Intracranial atherosclerotic changes as described.  Potentially flow limiting stenosis right PCA, not accompanied by acute cerebral infarction however.   Original Report Authenticated By: Elsie Stain, M.D.    Dg Chest Port 1 View  02/25/2012  *RADIOLOGY REPORT*  Clinical Data: CVA.  PORTABLE CHEST - 1 VIEW  Comparison: None.  Findings: The heart is borderline enlarged.  There is tortuosity and mild ectasia of the thoracic aorta.  The lungs are clear.  No pleural effusion.  The bony thorax is intact.  IMPRESSION:  1.  Mild cardiac enlargement. 2.  No acute pulmonary findings.   Original Report Authenticated By: P. Loralie Champagne, M.D.    Carotid Duplex Study  Patient: Marieme, Mcmackin MR #: [id number] Study Date: 02/26/2012 Gender: F Age: 2 Height: Weight: BSA: Pt. Status: Room: 4N26C  ATTENDING Lorenso Quarry, [facility] [place], [place] ADMITTING Ulyess Mort Reports also to:  ------------------------------------------------------------ History and indications:  Indications  CVA/Stroke. 780.4 Dizziness and giddiness. 781.2 Abnormality of gait. History  Diagnostic evaluation. Risk factors: Hypertension. Hyperlipidemia.  ------------------------------------------------------------ Study information:  Study status: Routine. Procedure: A vascular evaluation was performed with the patient in the supine position. Image quality was adequate. The right CCA, right ECA, right ICA, right vertebral, left CCA, left ECA, left ICA, and  left vertebral arteries were examined. Carotid duplex study. Carotid Duplex exam including 2D imaging, color and spectral Doppler were performed on the extracranial carotid and vertebral arteries using standard established protocols. Location: Bedside. Patient status: Inpatient.  Arterial flow:  +--------------------+--------+-------+ Location V sys V ed  +--------------------+--------+-------+ Right CCA - proximal55cm/s 13cm/s  +--------------------+--------+-------+ Right CCA - distal 46cm/s 13cm/s  +--------------------+--------+-------+ Right ECA -141cm/s-21cm/s +--------------------+--------+-------+ Right ICA - proximal-57cm/s -13cm/s +--------------------+--------+-------+ Right ICA - distal -55cm/s -12cm/s +--------------------+--------+-------+ Right vertebral 46cm/s 14cm/s  +--------------------+--------+-------+ Left CCA - proximal 93cm/s 24cm/s  +--------------------+--------+-------+ Left CCA - distal 61cm/s 11cm/s  +--------------------+--------+-------+ Left ECA -89cm/s -11cm/s +--------------------+--------+-------+ Left ICA - proximal -49cm/s -12cm/s +--------------------+--------+-------+ Left ICA - distal -62cm/s -24cm/s +--------------------+--------+-------+ Left vertebral -47cm/s -12cm/s +--------------------+--------+-------+  ------------------------------------------------------------ Summary: Right: mild wall thickening. Left: mild soft plaque origin ICA. Bilateral: no significant ICA stenosis. Vertebral artery flow is antegrade.  Other specific details can be found in the table(s) above. Prepared and Electronically Authenticated by  Delia Heady 2013-08-27T09:23:56.660  2D Echo: [facility]* *[facility]* [address] [place], [place] [address] [phone]  ------------------------------------------------------------ Transthoracic  Echocardiography  Patient: Olene, Godfrey MR #: [id number] Study Date: 02/26/2012 Gender: F Age: 2 Height: 165.1cm Weight: 79.4kg BSA: 1.27m^2 Pt. Status: Room: 4N26C  PERFORMING [facility], [facility] SONOGRAPHER Sherren Kerns, RVS ATTENDING Lorenso Quarry, Zenia Resides SONOGRAPHER Georgian Co, RDCS, CCT cc:  ------------------------------------------------------------ LV EF: 55% - 60%  ------------------------------------------------------------ Indications: CVA 436.  ------------------------------------------------------------ History: Risk factors: vomiting and dizziness. Hypertension.  ------------------------------------------------------------ Study Conclusions  - Left ventricle: The cavity size was normal. Systolic function was normal. The estimated ejection fraction was in the range of 55% to 60%. Wall motion was normal; there were no regional wall motion abnormalities. Doppler parameters are consistent with abnormal left ventricular relaxation (grade 1 diastolic dysfunction). - Right ventricle: The cavity size was mildly dilated. Wall thickness was normal. Impressions:  - No cardiac source of emboli was indentified. Recommendations: Consider transesophageal echocardiography if clinically indicated. Transthoracic echocardiography. M-mode, complete 2D, spectral Doppler, and color Doppler. Height: Height: 165.1cm. Height: 65in. Weight: Weight: 79.4kg. Weight: 174.6lb. Body mass index: BMI: 29.1kg/m^2. Body surface area: BSA: 1.20m^2. Blood pressure: 173/89. Patient status: Inpatient. Location: Bedside.  ------------------------------------------------------------  ------------------------------------------------------------ Left ventricle: The cavity size was normal. Systolic function was normal. The estimated ejection fraction was in the range of 55% to 60%. Wall motion was normal; there were no  regional wall motion abnormalities. Doppler parameters are consistent with abnormal left ventricular relaxation (grade 1 diastolic dysfunction).  ------------------------------------------------------------ Aortic valve: Mildly thickened, mildly calcified leaflets. Cusp separation was normal. Doppler: Transvalvular velocity was within the normal  range. There was no stenosis. No regurgitation.  ------------------------------------------------------------ Aorta: The aorta was normal, not dilated, and non-diseased.  ------------------------------------------------------------ Mitral valve: Structurally normal valve. Leaflet separation was normal. Doppler: Transvalvular velocity was within the normal range. There was no evidence for stenosis. No regurgitation. Peak gradient: 2mm Hg (D).  ------------------------------------------------------------ Left atrium: The atrium was at the upper limits of normal in size.  ------------------------------------------------------------ Right ventricle: The cavity size was mildly dilated. Wall thickness was normal. Systolic function was normal.  ------------------------------------------------------------ Pulmonic valve: Poorly visualized. Structurally normal valve. Cusp separation was normal. Doppler: Transvalvular velocity was within the normal range. No regurgitation.  ------------------------------------------------------------ Tricuspid valve: Structurally normal valve. Leaflet separation was normal. Doppler: No regurgitation.  ------------------------------------------------------------ Pulmonary artery: Poorly visualized. The main pulmonary artery was normal-sized.  ------------------------------------------------------------ Right atrium: The atrium was normal in size.  ------------------------------------------------------------ Pericardium: The pericardium was normal in appearance. There was no pericardial  effusion.  ------------------------------------------------------------ Systemic veins: Inferior vena cava: The vessel was normal in size; the respirophasic diameter changes were in the normal range (= 50%); findings are consistent with normal central venous pressure.  ------------------------------------------------------------ Post procedure conclusions Ascending Aorta:  - The aorta was normal, not dilated, and non-diseased.  ------------------------------------------------------------  2D measurements Normal Doppler Normal Left ventricle measurements LVID ED, 41.2 mm 43-52 Left ventricle chord, Ea, lat 6.39 cm/ ------- PLAX ann, tiss s LVID ES, 28.8 mm 23-38 DP chord, E/Ea, lat 11.82 ------- PLAX ann, tiss FS, chord, 30 % >29 DP PLAX Ea, med 4.61 cm/ ------- LVPW, ED 8 mm ------ ann, tiss s IVS/LVPW 1.06 <1.3 DP ratio, ED E/Ea, med 16.38 ------- Ventricular septum ann, tiss IVS, ED 8.51 mm ------ DP Aorta Mitral valve Root diam, 32 mm ------ Peak E vel 75.5 cm/ ------- ED s Left atrium Peak A vel 95.8 cm/ ------- AP dim 40 mm ------ s AP dim 2.14 cm/m^2 <2.2 Deceleratio 227 ms 150-230 index n time Vol, S 55 ml ------ Peak 2 mm ------- Vol index, 29.4 ml/m^2 ------ gradient, D Hg S Peak E/A 0.8 ------- Right ventricle ratio RVID ED, 29.5 mm 19-38 Systemic veins PLAX Estimated 5 mm ------- CVP Hg Right ventricle Sa vel, lat 13.5 cm/ ------- ann, tiss s DP  ------------------------------------------------------------ Prepared and Electronically Authenticated by  Donato Schultz 2013-08-26T17:32:38.160  Admission HPI: [name] is 71 year old female with PMH of HTN (uncontrolled) and Vertigo (12-14 years) presenting to the ED Saturday, 02/21/12 afternoon after worsening vertigo that started Friday night. [name] claims she has suffered from sudden attacks of vertigo for approximately 12-14 years, each episode spontaneously resolving after a few days to  maximum one week. Her last major episode was approximately 2 months ago that resolved in one week and for which she takes Meclizine Q4H PRN. She and her husband claim that she started having nausea and vertigo late Friday, February 23, 2012 night and then woke up on 02/24/12 with continuous vertigo, unsteady gait, and unable to keep her balance even when sitting in a chair. They checked her BP at home and report ~150s/100s and then report when EMS arrived BP was found to be 171/95. [name] also had multiple episodes of vomiting on Saturday and continuous nausea. Both husband and wife claim that normally they just let [name] sleep off the symptoms, but she has never had unsteady gait or unable to keep balance before, so they decided to come to the hospital. She denies any falls, lightheadedness, LOC, weakness, slurred speech, blurry vision, headaches, chest  pain, shortness of breath, or abdominal pain at this time. She has not vomited today but continues to have nausea and feels unsteady and reluctant to stand. Husband and wife report [name] to recently have high cholesterol with LDL~130 and HDL~56.  While In the ED: Given Valium 5mg  IV x1, Losartan 50mg  x1, and Meclizine 25mg  x4 doses. She was unable to have MRI on 02/24/12 secondary to vomiting and continuous nausea but had one morning of 02/25/12: + at least three areas of subcentimeter acute infaraction with one area of subacute infarction in widely disparate vascular territories along with widespread lacunar infarcts, chronic microvascular ischemic change, and numerous microbleeds. + left cerebellar white matter dorsal to 4th ventricle lesion. MRA+ intracranial atherosclerotic changes with possible flow limiting RPCA stenosis.  Of note, [name] used to see Dr. Candelaria Stagers as her PCP prior to him retiring in July who apparently diagnosed her with positional vertigo. She was then referred to Dr. Jenne Campus for ENT and claims she was told it was inner ear  related. [name] has been seeing Dr. Consuelo Pandy recently who she claims is more naturalistic medicine based and gets labwork done every 6 months. She has been taking weekly b12 injections, vit d3, kdur, and metformin 500mg  QHS, occasional baby ASA, and 2 DHEA tablets QAM along with her long term dose of Hyzaar 50-12.5mg  QD. She is also noted to have a right 9th rib fracture secondary to her husband picking her up from the car last week and taking her in the house.   Hospital Course by problem list: Principal Problem:  Multiple cerebral infarctions--presented with worsening vertigo and new onset loss of balance complaints with unsteady gait.  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  MRA confirmed intracranial atherosclerotic changes, potentially flow limiting stenosis right PCA.  Cerebellar territory infarction likely contributes most to ongoing symptoms of worsening vertigo and loss of balance.  Neuro consulted, will be continued on ASA and risk factor modification.  Initially antihypertensives held in setting of acute stroke, gradually restarting and changing home regimen of Hyzaar per patient's request and starting with low dose Lisinopril.  Also started on statin therapy for ~30% reduction in LDL.  PT/OT/Speech also consulted during admission recommending rolling walker with wheels and outpatient PT supervision for mobility/OOB.  She will need to follow up with neurology (Dr. Pearlean Brownie in two months) and opc.      Vertigo/Unsteady gait/Nausea & vomiting--hx of vertigo and liley BPPV for 12-14 years as reported by Ms. Shein.  Reports taking Meclizine prn at home for episodes.   Usually last days to one week, most recent one approximately two months ago, lasting at least one week.  Admitted with worsening vertigo, vomiting prior to admission and while in ED, intermittent nausea throughout admission but tolerating diet, and unsteady gait and feeling unbalanced.  Found to have multiple cerebral infarctions one of which being in cerebellar territory likely contributing to symptoms.  Imbalance gradually improving during admission along with working with PT.  She will need outpatient PT, has been given a walker as per PT recommendations, risk factor management, follow up with ENT, and close follow up in [facility] or with PCP of risk factor management.  Will need to follow up with Dr. Pearlean Brownie in two months  from neurology and outpatient vestibular rehab as well.  Ms. Waltermire would like to continue taking Meclizine as an outpatient as needed.   Hypertension--hx of hypertension on Hyzaar 50-12.5 for years, uncontrolled.  Initially held all antihypertensives on admission, restarted low dose norvasc and HCTZ on 02/27/12, BP regulated, adjusted and changed to Lisinopril 5mg  PO QD per Ms. Fiore's request and will need to be further adjusted outpatient for long-term ideal BP control.  Ms. Cadmus did not want to be back on Hyzaar as she does not believe it works for her at all and that she has been on it for so long and BP still not controlled.    Hyperglycemia: HbA1c found to be 5.9 during admission.  Ms. Nolte claims to be on 250mg  Metformin once a day as per recommendation of her outside physician.  Initially held metformin, restarted 500mg  PO QD on 02/27/12.  Blood glucose was monitored during admission and will need to be followed as an outpatient as well for long term continued good glucose control.   Hypokalemia: Ms. Ehler claims to be on KDur daily as per outside physician.  Etiology of chronic hypokalemia unknown.  K= 4.2 on 02/28/12 after repletion, monitored during admission and will  need to be followed up as outpatient and with PCP as well.  Aldosterone+renin activity with ratio lab test ordered for further investigation of chronic hypokalemia.  We will continue daily Kdur BID until follow up with PCP to re-evaluate given hx of chronic hypokalemia.  Will need to have labwork done in one week of discharge and follow up with PCP in two weeks.    Discharge Vitals:  BP 150/73  Pulse 68  Temp 97.8 F (36.6 C) (Oral)  Resp 18  Ht 5\' 5"  (1.651 m)  Wt 175 lb 14.8 oz (79.8 kg)  BMI 29.28 kg/m2  SpO2 97%  Discharge Labs:  Results for orders placed during the hospital encounter of 02/24/12 (from the past 24 hour(s))  CBC     Status: Normal   Collection Time   02/29/12  7:03 AM      Component Value Range   WBC 9.8  4.0 - 10.5 K/uL   RBC 4.61  3.87 - 5.11 MIL/uL   Hemoglobin 13.6  12.0 - 15.0 g/dL   HCT 16.1  09.6 - 04.5 %   MCV 85.7  78.0 - 100.0 fL   MCH 29.5  26.0 - 34.0 pg   MCHC 34.4  30.0 - 36.0 g/dL   RDW 40.9  81.1 - 91.4 %   Platelets 303  150 - 400 K/uL  BASIC METABOLIC PANEL     Status: Abnormal   Collection Time   02/29/12  7:03 AM      Component Value Range   Sodium 139  135 - 145 mEq/L   Potassium 3.1 (*) 3.5 - 5.1 mEq/L   Chloride 101  96 - 112 mEq/L   CO2 28  19 - 32 mEq/L   Glucose, Bld 99  70 - 99 mg/dL   BUN 15  6 - 23 mg/dL   Creatinine, Ser 7.82  0.50 - 1.10 mg/dL   Calcium 9.6  8.4 - 95.6 mg/dL   GFR calc non Af Amer 73 (*) >90 mL/min   GFR calc Af Amer 85 (*) >90 mL/min  GLUCOSE, CAPILLARY     Status: Abnormal   Collection Time   02/29/12  8:03 AM      Component Value Range   Glucose-Capillary 114 (*) 70 -  99 mg/dL   Signed: Darden Palmer 02/29/2012, 1:49 PM   Time Spent on Discharge: >30 minutes

## 2012-02-27 NOTE — Progress Notes (Addendum)
Subjective: [name] was seen and examined by bedside.  She still has vertigo complaints and feeling off balance but notes some mild improvement since admission.  She denies vomiting but has slight nausea but good appetite and tolerated breakfast her husband brought her this morning.  She claims to be nervous with the vertigo and would like to follow up in [facility] after discharge until she sets up her PCP with husband.  She reports no other complaints at this time other than vertigo and feeling unbalanced.  She denies vomiting, diarrhea, headaches, chest pain, shortness of breath, or abdominal pain at this time.    Objective: Vital signs in last 24 hours: Filed Vitals:   02/26/12 2329 02/27/12 0048 02/27/12 0559 02/27/12 0932  BP: 178/97 176/100 164/78 166/85  Pulse:   70 75  Temp:   97.9 F (36.6 C) 97.8 F (36.6 C)  TempSrc:   Oral Oral  Resp:    16  Height:      Weight:      SpO2:   100% 98%   Weight change:  No intake or output data in the 24 hours ending 02/27/12 1156 Physical Exam:   Vitals reviewed.  General: resting in bed, complaining of vertigo  HEENT: PERRLA, EOMI, no scleral icterus Cardiac: RRR, no rubs, murmurs or gallops  Pulm: clear to auscultation bilaterally, no wheezes, rales, or rhonchi  Abd: soft, nontender, nondistended, +BS present  Ext: warm and well perfused, no pedal edema  Neuro: alert and oriented X3, cranial nerves II-XII grossly intact, strength and sensation to light touch equal in bilateral upper and lower extremities. Gait assessment deferred, good heel to shin b/l, good to thumb to fingers touch.    Lab Results: Basic Metabolic Panel:  Lab 02/27/12 1610 02/27/12 0641  NA 141 145  K 3.1* 2.8*  CL 102 105  CO2 30 28  GLUCOSE 120* 105*  BUN 12 12  CREATININE 0.73 0.68  CALCIUM 9.6 9.4  MG -- 1.9  PHOS -- --   Liver Function Tests:  Lab 02/25/12 1006  AST 24  ALT 21  ALKPHOS 48  BILITOT 0.5  PROT 6.3  ALBUMIN 3.2*   CBC:  Lab  02/27/12 0641 02/25/12 1006  WBC 10.7* 10.4  NEUTROABS -- 8.0*  HGB 13.6 12.2  HCT 39.1 35.7*  MCV 84.8 85.4  PLT 288 275   Cardiac Enzymes:  Lab 02/25/12 1006  CKTOTAL 386*  CKMB 6.0*  CKMBINDEX --  TROPONINI <0.30   Fasting Lipid Panel:  Lab 02/26/12 0640  CHOL 171  HDL 50  LDLCALC 96  TRIG 125  CHOLHDL 3.4  LDLDIRECT --   Coagulation:  Lab 02/25/12 1006  LABPROT 14.3  INR 1.09   Urinalysis:  Lab 02/24/12 1655  COLORURINE YELLOW  LABSPEC 1.016  PHURINE 7.5  GLUCOSEU NEGATIVE  HGBUR NEGATIVE  BILIRUBINUR NEGATIVE  KETONESUR NEGATIVE  PROTEINUR NEGATIVE  UROBILINOGEN 0.2  NITRITE NEGATIVE  LEUKOCYTESUR NEGATIVE   Studies/Results: Dg Chest Port 1 View  02/25/2012  *RADIOLOGY REPORT*  Clinical Data: CVA.  PORTABLE CHEST - 1 VIEW  Comparison: None.  Findings: The heart is borderline enlarged.  There is tortuosity and mild ectasia of the thoracic aorta.  The lungs are clear.  No pleural effusion.  The bony thorax is intact.  IMPRESSION:  1.  Mild cardiac enlargement. 2.  No acute pulmonary findings.   Original Report Authenticated By: P. Loralie Champagne, M.D.    Medications: I have reviewed the patient's current medications.  Scheduled Meds:    . aspirin  300 mg Rectal Daily   Or  . aspirin  325 mg Oral Daily  . heparin  5,000 Units Subcutaneous Q8H  . lidocaine  1 patch Transdermal Q24H  . lidocaine  1 patch Transdermal Q24H  . potassium chloride  40 mEq Oral BID   Continuous Infusions:  PRN Meds:.acetaminophen, acetaminophen, diazepam, meclizine, promethazine, senna-docusate  Assessment/Plan: 71 year old woman with past medical history significant for HTN, vertigo for 12-14 years comes to the ER with complaints of vertigo, nausea and vomiting x3 days. She presented to the ED 02/24/12 and had MRI today that confirmed the diagnosis of acute ischemic stroke.   1.Acute ischemic stroke: Presented with symptoms of vertigo , nausea and vomiting. On our exam  ,she is leaning towards right with ? left abnormal finger to nose. She had MRI that showed three small areas of acute cerebral infarction and an area in the territory of left cerebellum and around right caudate nucleus. Given the distribution of her stroke, we are suspicious towards stroke being secondary to small vessel disease vs. embolic in origin. She used to take ASA off and on at home.  - Continue Aspirin 325mg  PO QD for secondary stroke prevention as per neuro - Neuro checks  - 2D echo--EF 55-60%, normal systolic function, normal wall motion, RV mildly dilated - f/u Carotid dopplers--prelim report no ICA stenosis, vertebral artery flow is antegrade - Risk stratification - AIC 5.9: , Lipid panel: Chol 171, LDL: 96, TG:125, HDL:50  - Symptomatic treatment of vertigo with PRN valium, meclizine  - Phenergan for nausea.  - Neuro following: continue ASA, aggressive risk factor management - f/u Dr. Pearlean Brownie in 2 months - Outpatient vestibular rehab - Start statin - f/u PT recommendations  2. HTN: Given Cozaar 50mg  x1 in ED. Hx of uncontrolled HTN with Hyzaar 50-12.5mg  at home  - Start Amlodipine 5 and HCTZ 12.5 today - monitor vitals   3. Hypokalemia: likely 2/2 diuretics.  - check Mg.  - replete   4. Hyperglycemia  -restart Metfomin 500mg  PO QD  DVT: heparin  Diet: heart healthy  Dispo: pending clinical improvement, neuro recommendations.    LOS: 3 days   Darden Palmer 02/27/2012, 11:56 AM

## 2012-02-27 NOTE — Evaluation (Signed)
Occupational Therapy Evaluation **late entry for 02/26/12** Patient Details Name: Jill Hancock MRN: [id number] DOB: 04/24/1942 Today's Date: 02/27/2012 Time: 4098-1191 OT Time Calculation (min): 33 min  OT Assessment / Plan / Recommendation Clinical Impression  Pt adm with multiple cerebral and Lt cerebellar infarcts (some acute and some subacute) with deficits in mobility due to decr balance, dizziness/vertigo, nausea, and anxiety. Pt will benefit from skilled OT in the acute setting to maximize I with ADL and ADL mobility prior to d/c.     OT Assessment  Patient needs continued OT Services    Follow Up Recommendations  No OT follow up;Supervision/Assistance - 24 hour    Barriers to Discharge      Equipment Recommendations  Rolling walker with 5" wheels;None recommended by OT    Recommendations for Other Services    Frequency  Min 2X/week    Precautions / Restrictions Precautions Precautions: Fall Precaution Comments: pt using visual compensatory strategies with mobility Restrictions Weight Bearing Restrictions: No   Pertinent Vitals/Pain Pt with reports of nausea with movement- RN aware and has provided medication    ADL  Grooming: Performed;Minimal assistance Where Assessed - Grooming: Supported standing Upper Body Dressing: Performed;Minimal assistance Where Assessed - Upper Body Dressing: Unsupported sitting (LOB during sitting- required assist to correct) Lower Body Dressing: Performed;Minimal assistance Where Assessed - Lower Body Dressing: Unsupported sitting (assist for LOB in sitting) Toilet Transfer: Simulated;Moderate assistance Toilet Transfer Method: Sit to Barista:  (from bed ) Toileting - Clothing Manipulation and Hygiene: Simulated;Maximal assistance Where Assessed - Toileting Clothing Manipulation and Hygiene: Standing Equipment Used: Gait belt Transfers/Ambulation Related to ADLs: Pt leaning right upon standing and with LOB  x 2 during ambulation to chair.     OT Diagnosis: Acute pain;Disturbance of vision  OT Problem List: Decreased activity tolerance;Impaired balance (sitting and/or standing);Impaired vision/perception;Decreased knowledge of use of DME or AE OT Treatment Interventions: Self-care/ADL training;DME and/or AE instruction;Therapeutic activities;Visual/perceptual remediation/compensation;Patient/family education;Balance training   OT Goals Acute Rehab OT Goals OT Goal Formulation: With patient Time For Goal Achievement: 03/05/12 Potential to Achieve Goals: Good ADL Goals Pt Will Perform Grooming: with supervision;Standing at sink ADL Goal: Grooming - Progress: Goal set today Pt Will Perform Upper Body Dressing: with set-up;Sitting, bed;Sitting, chair ADL Goal: Upper Body Dressing - Progress: Goal set today Pt Will Perform Lower Body Dressing: Sit to stand from chair;Sit to stand from bed;with supervision ADL Goal: Lower Body Dressing - Progress: Goal set today Pt Will Transfer to Toilet: with supervision;Ambulation;with DME ADL Goal: Toilet Transfer - Progress: Goal set today Pt Will Perform Toileting - Clothing Manipulation: with supervision;Standing ADL Goal: Toileting - Clothing Manipulation - Progress: Goal set today Pt Will Perform Toileting - Hygiene: Independently;Sitting on 3-in-1 or toilet ADL Goal: Toileting - Hygiene - Progress: Goal set today  Visit Information  Assistance Needed: +1    Subjective Data  Subjective: Are you really going to make me get up and walk? Patient Stated Goal: return home   Prior Functioning  Vision/Perception  Home Living Lives With: Spouse Available Help at Discharge: Family;Available 24 hours/day Type of Home: House Home Access: Stairs to enter Home Layout: One level Bathroom Shower/Tub: Forensic psychologist: None Prior Function Level of Independence: Independent Able to Take Stairs?:  Reciprically Driving: Yes Vocation: Full time employment Communication Communication:  (occasional word-finding diff (unsure if just anxious?)) Dominant Hand: Right   Vision - Assessment Additional Comments: right beating nystagmus possible with head turns-  difficult to assess as pt with significant difficulty maintaing open eyes during exam. pt with full ocular movement   Cognition  Overall Cognitive Status: Appears within functional limits for tasks assessed/performed Arousal/Alertness: Awake/alert Orientation Level: Appears intact for tasks assessed Behavior During Session: [facility] for tasks performed    Extremity/Trunk Assessment Right Upper Extremity Assessment RUE ROM/Strength/Tone: Within functional levels RUE Sensation: WFL - Light Touch RUE Coordination: WFL - gross/fine motor Left Upper Extremity Assessment LUE ROM/Strength/Tone: Within functional levels LUE Sensation: WFL - Light Touch LUE Coordination: WFL - gross/fine motor   Mobility Bed Mobility Bed Mobility: Supine to Sit Rolling Left: 4: Min guard;With rail Sit to Supine: 5: Supervision;HOB elevated Details for Bed Mobility Assistance: pt requests HOB elevated due to increases her dizziness if she lies down flat Transfers Sit to Stand: 3: Mod assist;From bed;With upper extremity assist Stand to Sit: 4: Min assist;With armrests;To chair/3-in-1 Details for Transfer Assistance: first time pt OOB. strong right lateral lean upon standing (initially) able to correct with VCs   Exercise Other Exercises Other Exercises: pt instructed in x 1 visual exercises and able to perform horizontal head turns (at very slow speed) x 10 seconds with dizziness increasing to a 7/10  Balance    End of Session OT - End of Session Equipment Utilized During Treatment: Gait belt Activity Tolerance:  (limited by dizziness) Patient left: in chair;with call bell/phone within reach;with family/visitor present Nurse Communication: Mobility status    GO     Valentina Alcoser 02/27/2012, 3:52 PM

## 2012-02-27 NOTE — Progress Notes (Signed)
Physical Therapy Treatment Patient Details Name: Jill Hancock MRN: [id number] DOB: 04/24/1942 Today's Date: 02/27/2012 Time: 3086-5784 PT Time Calculation (min): 31 min  PT Assessment / Plan / Recommendation Comments on Treatment Session  Pt had spoken to physicians re: completing test for BPPV and asked if we could do the test later in the afternoon. Pt agreed to do [employer] testing at 3:45 after she is premedicated.     Follow Up Recommendations  Outpatient PT;Supervision for mobility/OOB    Barriers to Discharge        Equipment Recommendations  Rolling walker with 5" wheels    Recommendations for Other Services    Frequency Min 4X/week   Plan Discharge plan remains appropriate;Frequency remains appropriate    Precautions / Restrictions Precautions Precautions: Fall Precaution Comments: pt using visual compensatory strategies with mobility   Pertinent Vitals/Pain Max dizziness 7/10 during session; max nausea 3-4/10    Mobility  Bed Mobility Bed Mobility: Sit to Supine Sit to Supine: 5: Supervision;HOB elevated Details for Bed Mobility Assistance: pt requests HOB elevated due to increases her dizziness if she lies down flat Transfers Transfers: Sit to Stand;Stand to Sit Sit to Stand: 4: Min guard;With upper extremity assist;From chair/3-in-1;From toilet Stand to Sit: 4: Min guard;With upper extremity assist;To bed;To toilet Details for Transfer Assistance: Pt able to maintain midline during transitions; no loss of balance or unsteadiness although does feel incr dizziness Ambulation/Gait Ambulation/Gait Assistance: 4: Min guard Ambulation Distance (Feet): 35 Feet (10, 25) Assistive device: Rolling walker;None Ambulation/Gait Assistance Details: Pt moving very slowly due to incr dizziness and nausea; able to maintain midline; pt able to recall use of visual target to minimize symptoms Gait Pattern: Step-through pattern Gait velocity: significantly decr Modified  Rankin (Stroke Patients Only) Pre-Morbid Rankin Score: No symptoms Modified Rankin: Moderately severe disability    Exercises Other Exercises Other Exercises: pt instructed in x 1 visual exercises and able to perform horizontal head turns (at very slow speed) x 10 seconds with dizziness increasing to a 7/10   PT Diagnosis:    PT Problem List:   PT Treatment Interventions:     PT Goals Acute Rehab PT Goals Pt will go Sit to Supine/Side: with modified independence;with HOB 0 degrees PT Goal: Sit to Supine/Side - Progress: Progressing toward goal Pt will go Sit to Stand: with modified independence;with upper extremity assist;Other (comment) PT Goal: Sit to Stand - Progress: Progressing toward goal Pt will Ambulate: 51 - 150 feet;with supervision;with least restrictive assistive device;Other (comment) PT Goal: Ambulate - Progress: Progressing toward goal Additional Goals Additional Goal #1: Pt able to independently utilize gaze stabilization techniques to decr symptoms PT Goal: Additional Goal #1 - Progress: Progressing toward goal Additional Goal #2: Pt able to perform x 1 exercises in horizontal and vertical planes in sititng x 30 seconds with dizziness < 7/10 PT Goal: Additional Goal #2 - Progress: Progressing toward goal  Visit Information  Last PT Received On: 02/27/12 Assistance Needed: +1    Subjective Data  Subjective: States she feels a bit better today. Has been practicing turning her head side to side   Cognition  Arousal/Alertness: Awake/alert Behavior During Session: [facility] for tasks performed    Balance     End of Session PT - End of Session Equipment Utilized During Treatment: Gait belt Activity Tolerance: Other (comment) (limited by dizziness and nausea (less so anxiety)) Patient left: in bed;with call bell/phone within reach;with family/visitor present;with nursing in room Nurse Communication: Mobility status;Other (comment) (plan  for medicines at 3:15 for next  session at 3:45)   GP     Donye Campanelli 02/27/2012, 12:35 PM Pager [phone]

## 2012-02-27 NOTE — Progress Notes (Signed)
Occupational Therapy Treatment Patient Details Name: Jill Hancock MRN: [id number] DOB: 04/24/1942 Today's Date: 02/27/2012 Time: 0936-1000 OT Time Calculation (min): 24 min  OT Assessment / Plan / Recommendation Comments on Treatment Session Pt with fewer LOB's today, but continues to be limited by dizziness and nausea.     Follow Up Recommendations  No OT follow up;Supervision/Assistance - 24 hour    Barriers to Discharge       Equipment Recommendations  Rolling walker with 5" wheels;None recommended by OT    Recommendations for Other Services    Frequency Min 2X/week   Plan Discharge plan remains appropriate    Precautions / Restrictions Precautions Precautions: Fall Precaution Comments: pt using visual compensatory strategies with mobility Restrictions Weight Bearing Restrictions: No   Pertinent Vitals/Pain Pt denies any pain.    ADL  Grooming: Performed;Min guard;Wash/dry face;Teeth care Where Assessed - Grooming: Unsupported standing Upper Body Bathing: Simulated;Set up;Supervision/safety Where Assessed - Upper Body Bathing: Unsupported sitting Lower Body Bathing: Simulated;Min guard Where Assessed - Lower Body Bathing: Supported sit to stand Upper Body Dressing: Performed;Minimal assistance Where Assessed - Upper Body Dressing: Unsupported sitting (LOB during sitting- required assist to correct) Lower Body Dressing: Performed;Min guard Where Assessed - Lower Body Dressing: Supported sit to stand (VC to thread LE with ankles crossed, sitting in chair) Toilet Transfer: Performed;Min guard Statistician Method: Sit to Barista:  (from bed) Toileting - Clothing Manipulation and Hygiene: Simulated;Maximal assistance Where Assessed - Engineer, mining and Hygiene: Standing Equipment Used: Gait belt Transfers/Ambulation Related to ADLs: pt with min guard A with RW ambulation throughout room; pt with LOB posterior x 1 while  standing at the sink- pt able to catch self on sink ADL Comments: Pt reports she is "slightly less dizzy today" Encourage pt to participate in BPPV testing with PT later this afternoon. Regarding shower transfer: pt declined stating she will perform sink baths at home with husband if she continues to be dizzy as she does not want a shower seat, or 3n1 for that matter.    OT Diagnosis: Acute pain;Disturbance of vision  OT Problem List: Decreased activity tolerance;Impaired balance (sitting and/or standing);Impaired vision/perception;Decreased knowledge of use of DME or AE OT Treatment Interventions: Self-care/ADL training;DME and/or AE instruction;Therapeutic activities;Visual/perceptual remediation/compensation;Patient/family education;Balance training   OT Goals Acute Rehab OT Goals OT Goal Formulation: With patient Time For Goal Achievement: 03/05/12 Potential to Achieve Goals: Good ADL Goals Pt Will Perform Grooming: with supervision;Standing at sink ADL Goal: Grooming - Progress: Progressing toward goals Pt Will Perform Upper Body Dressing: with set-up;Sitting, bed;Sitting, chair ADL Goal: Upper Body Dressing - Progress: Progressing toward goals Pt Will Perform Lower Body Dressing: Sit to stand from chair;Sit to stand from bed;with supervision ADL Goal: Lower Body Dressing - Progress: Progressing toward goals Pt Will Transfer to Toilet: with supervision;Ambulation;with DME ADL Goal: Toilet Transfer - Progress: Progressing toward goals Pt Will Perform Toileting - Clothing Manipulation: with supervision;Standing ADL Goal: Toileting - Clothing Manipulation - Progress: Progressing toward goals Pt Will Perform Toileting - Hygiene: Independently;Sitting on 3-in-1 or toilet ADL Goal: Toileting - Hygiene - Progress: Progressing toward goals  Visit Information  Last OT Received On: 02/27/12 Assistance Needed: +1    Subjective Data  Subjective: Are you really going to make me get up and  walk? Patient Stated Goal: return home   Prior Functioning  Home Living Lives With: Spouse Available Help at Discharge: Family;Available 24 hours/day Type of Home: House Home Access: Stairs to enter  Home Layout: One level Bathroom Shower/Tub: Forensic psychologist: None Prior Function Level of Independence: Independent Able to Take Stairs?: Reciprically Driving: Yes Vocation: Full time employment Communication Communication:  (occasional word-finding diff (unsure if just anxious?)) Dominant Hand: Right    Cognition  Overall Cognitive Status: Appears within functional limits for tasks assessed/performed Arousal/Alertness: Awake/alert Orientation Level: Appears intact for tasks assessed Behavior During Session: [facility] for tasks performed    Mobility Bed Mobility Bed Mobility: Supine to Sit Rolling Left: 4: Min guard;With rail Supine to Sit: 5: Supervision;With rails;HOB elevated Sit to Supine: 5: Supervision;HOB elevated Details for Bed Mobility Assistance: VC for fixation before movement Transfers Sit to Stand: 4: Min guard;From bed;With upper extremity assist Stand to Sit: 4: Min guard;With armrests;To chair/3-in-1 Details for Transfer Assistance: Good hand placement   Exercises Other Exercises Other Exercises: pt instructed in x 1 visual exercises and able to perform horizontal head turns (at very slow speed) x 10 seconds with dizziness increasing to a 7/10  Balance Static Sitting Balance Static Sitting - Balance Support: Feet supported;No upper extremity supported Static Sitting - Level of Assistance: 5: Stand by assistance;4: Min assist Static Sitting - Comment/# of Minutes:  End of Session OT - End of Session Equipment Utilized During Treatment: Gait belt Activity Tolerance: Patient tolerated treatment well (continues to experience dizziness) Patient left: in chair;with call bell/phone within reach;with  family/visitor present Nurse Communication: Mobility status  GO     Giann Obara 02/27/2012, 4:06 PM

## 2012-02-27 NOTE — Progress Notes (Signed)
Physical Therapy Treatment Patient Details Name: Jill Hancock MRN: [id number] DOB: 04/24/1942 Today's Date: 02/27/2012 Time: 4098-1191 PT Time Calculation (min): 41 min  PT Assessment / Plan / Recommendation Comments on Treatment Session  Pt refused phenergan prior to session due to recent episode of feeling she was falling to the Rt (all previous episodes felt she was falling to the Lt). Checked BP (169/80). Pt deferred Gil[employer] Better testing for BPPV which at this time seems reasonable as she has not experienced true vertigo (spinning) since yesterday morning. RN made aware. Pt's balance no different than this mornings session.    Follow Up Recommendations  Outpatient PT;Supervision for mobility/OOB;Other (comment) (if pt able to tolerate riding in the car--may incr her sympt)    Barriers to Discharge        Equipment Recommendations  Rolling walker with 5" wheels;None recommended by OT    Recommendations for Other Services    Frequency Min 4X/week   Plan Discharge plan remains appropriate;Frequency remains appropriate    Precautions / Restrictions Precautions Precautions: Fall Precaution Comments: pt using visual compensatory strategies with mobility Restrictions Weight Bearing Restrictions: No   Pertinent Vitals/Pain     Mobility  Bed Mobility Bed Mobility: Supine to Sit;Sit to Supine Rolling Left: 4: Min guard;With rail Supine to Sit: 5: Supervision;With rails;HOB elevated Sit to Supine: 5: Supervision;HOB elevated Details for Bed Mobility Assistance: supervision due to fear of falling Transfers Transfers: Sit to Stand;Stand to Sit Sit to Stand: 4: Min guard;From bed;With upper extremity assist Stand to Sit: 4: Min guard;With armrests;To chair/3-in-1 Details for Transfer Assistance: x2 Ambulation/Gait Ambulation/Gait Assistance: 4: Min guard Ambulation Distance (Feet): 30 Feet Assistive device: Rolling walker Ambulation/Gait Assistance Details: Pt moving very  slowly due to incr dizziness and nausea; able to maintain midline; pt able to recall use of visual target to minimize symptoms Gait Pattern: Step-through pattern Gait velocity: significantly decr Modified Rankin (Stroke Patients Only) Pre-Morbid Rankin Score: No symptoms Modified Rankin: Moderately severe disability    Exercises Other Exercises Other Exercises: reviewed instructions for x 1 exercises   PT Diagnosis:    PT Problem List:   PT Treatment Interventions:     PT Goals Acute Rehab PT Goals Pt will go Supine/Side to Sit: with modified independence;with HOB 0 degrees;Other (comment) PT Goal: Supine/Side to Sit - Progress: Progressing toward goal Pt will go Sit to Supine/Side: with modified independence;with HOB 0 degrees PT Goal: Sit to Supine/Side - Progress: Progressing toward goal Pt will go Sit to Stand: with modified independence;with upper extremity assist;Other (comment) PT Goal: Sit to Stand - Progress: Progressing toward goal Pt will Ambulate: 51 - 150 feet;with supervision;with least restrictive assistive device;Other (comment) PT Goal: Ambulate - Progress: Progressing toward goal Additional Goals Additional Goal #1: Pt able to independently utilize gaze stabilization techniques to decr symptoms PT Goal: Additional Goal #1 - Progress: Progressing toward goal Additional Goal #2: Pt able to perform x 1 exercises in horizontal and vertical planes in sititng x 30 seconds with dizziness < 7/10 PT Goal: Additional Goal #2 - Progress: Progressing toward goal  Visit Information  Last PT Received On: 02/27/12 Assistance Needed: +1    Subjective Data  Subjective: Pt/husband report ~15:00 pt was sitting EOB and felt she was suddenly falling to her Rt (she did not actually lose her balance, but she grabbed husband's arm due to strong feeling she was falling). Pt denies any sense of spinning sense yesterday morning Patient Stated Goal: stop feeling dizzy; not throw  up; go  home   Cognition  Overall Cognitive Status: Appears within functional limits for tasks assessed/performed Arousal/Alertness: Awake/alert Orientation Level: Appears intact for tasks assessed Behavior During Session: [facility] for tasks performed    Balance  Static Sitting Balance Static Sitting - Balance Support: Feet supported;No upper extremity supported Static Sitting - Level of Assistance: 5: Stand by assistance Static Sitting - Comment/# of Minutes: Static Standing Balance Static Standing - Balance Support: Bilateral upper extremity supported Static Standing - Level of Assistance: 5: Stand by assistance Static Standing - Comment/# of Minutes: midline  End of Session PT - End of Session Equipment Utilized During Treatment: Gait belt Activity Tolerance: Other (comment) (limited by dizziness, nausea, anxiety) Patient left: in bed;with call bell/phone within reach;with family/visitor present;with nursing in room Nurse Communication: Mobility status;Other (comment)   GP     Lenea Bywater 02/27/2012, 4:33 PM Pager [phone]

## 2012-02-27 NOTE — Progress Notes (Signed)
Patient's blood pressure 176/100. Pt is asymptomatic. Notified doctor. No new interventions at this time. Will continue to monitor.

## 2012-02-27 NOTE — Progress Notes (Signed)
Internal Medicine Teaching Service Attending Note Date: 02/27/2012  Patient name: Jill Hancock  Medical record number: [id number]  Date of birth: 04/24/1942    This patient has been seen and discussed with the house staff. Please see their note for complete details. I concur with their findings with the following additions/corrections: Still with vertigo. PT will attempt [employer] maneuvers to see if there is a reversible component to her vertigo. Start low dose norvasc and HCTZ. Statin for 30% reduction in LDL.   BUTCHER,ELIZABETH 02/27/2012, 12:18 PM

## 2012-02-27 NOTE — Evaluation (Signed)
Speech Language Pathology Evaluation Patient Details Name: Jill Hancock MRN: [id number] DOB: 04/24/1942 Today's Date: 02/27/2012 Time: 4098-1191 SLP Time Calculation (min): 7 min  Problem List:  Patient Active Problem List  Diagnosis  . Multiple cerebral infarctions  . Vertigo  . Nausea & vomiting  . Unsteady gait   Past Medical History:  Past Medical History  Diagnosis Date  . Hypertension   . Vertigo    Past Surgical History: History reviewed. No pertinent past surgical history. HPI:  71 y.o. female who presents with sudden onset vertigo that started yesterday. She states that came on suddenly, and was similar to previous attacks of BPPV, but did not go away after 5 minutes as it typically does. Because it was not resolving, she had her husband call EMS.    Assessment / Plan / Recommendation Clinical Impression  Pt presents with normal language/cognitive function; no SLP f/u warranted.    SLP Assessment  Patient does not need any further Speech Language Pathology Services    Jill Hancock L. Jill Hancock, [place] CCC/SLP Pager [phone]  Jill Hancock Jill Hancock 02/27/2012, 11:54 AM

## 2012-02-28 LAB — CBC
HCT: 41.1 % (ref 36.0–46.0)
Hemoglobin: 14.1 g/dL (ref 12.0–15.0)
MCH: 29.6 pg (ref 26.0–34.0)
MCHC: 34.3 g/dL (ref 30.0–36.0)
MCV: 86.2 fL (ref 78.0–100.0)
Platelets: 309 10*3/uL (ref 150–400)
RDW: 13.7 % (ref 11.5–15.5)
WBC: 11.3 10*3/uL — ABNORMAL HIGH (ref 4.0–10.5)

## 2012-02-28 LAB — BASIC METABOLIC PANEL
Chloride: 103 mEq/L (ref 96–112)
Creatinine, Ser: 0.78 mg/dL (ref 0.50–1.10)
GFR calc Af Amer: >90 mL/min (ref >90)
GFR calc non Af Amer: 83 mL/min — ABNORMAL LOW (ref >90)
Glucose, Bld: 112 mg/dL — ABNORMAL HIGH (ref 70–99)
Potassium: 4.2 mEq/L (ref 3.5–5.1)

## 2012-02-28 MED ORDER — AMLODIPINE BESYLATE 2.5 MG PO TABS: 2.5000 mg | ORAL_TABLET | Freq: Every day | ORAL | Status: DC

## 2012-02-28 MED ORDER — LIDOCAINE 5 % EX PTCH
1.0000 | MEDICATED_PATCH | CUTANEOUS | Status: AC
  Administered 2012-02-28: 1 via TRANSDERMAL
  Filled 2012-02-28: qty 1

## 2012-02-28 MED ORDER — PANTOPRAZOLE SODIUM 40 MG PO TBEC
40.0000 mg | DELAYED_RELEASE_TABLET | Freq: Every day | ORAL | Status: DC
  Administered 2012-02-28 – 2012-02-29 (×2): 40 mg via ORAL
  Filled 2012-02-28 (×2): qty 1

## 2012-02-28 MED ORDER — LISINOPRIL 5 MG PO TABS
5.0000 mg | ORAL_TABLET | Freq: Every day | ORAL | Status: DC
  Administered 2012-02-29: 5 mg via ORAL
  Filled 2012-02-28: qty 1

## 2012-02-28 MED ORDER — ALUM & MAG HYDROXIDE-SIMETH 200-200-20 MG/5ML PO SUSP
15.0000 mL | Freq: Four times a day (QID) | ORAL | Status: DC | PRN
  Administered 2012-02-28: 15 mL via ORAL
  Filled 2012-02-28: qty 30

## 2012-02-28 NOTE — Progress Notes (Signed)
Physical Therapy Treatment Patient Details Name: Jill Hancock MRN: [id number] DOB: 04/24/1942 Today's Date: 02/28/2012 Time: 4098-1191 PT Time Calculation (min): 33 min  PT Assessment / Plan / Recommendation Comments on Treatment Session  Pt continues to feel off-balance (or like she's floating), however able to maintain straight path with no loss of balance with use of visual compensatory strategies. Still requiring cues for approp use of targets in a busier environment (hallway).    Follow Up Recommendations  Outpatient PT;Supervision for mobility/OOB    Barriers to Discharge        Equipment Recommendations  Rolling walker with 5" wheels;None recommended by OT    Recommendations for Other Services    Frequency Min 4X/week   Plan Discharge plan remains appropriate;Frequency remains appropriate    Precautions / Restrictions Precautions Precautions: Fall Precaution Comments: pt using visual compensatory strategies with mobility   Pertinent Vitals/Pain Denied pain     Mobility  Bed Mobility Bed Mobility: Supine to Sit Supine to Sit: 6: Modified independent (Device/Increase time);HOB elevated (very slow to decr dizziness) Transfers Transfers: Sit to Stand;Stand to Sit Sit to Stand: 4: Min guard;From bed;With upper extremity assist Stand to Sit: 4: Min guard;With armrests;To chair/3-in-1 Details for Transfer Assistance: minguard assist for safety; no loss of balance or sudden incr in dizziness (as has occurred previously) Ambulation/Gait Ambulation/Gait Assistance: 4: Min guard Ambulation Distance (Feet): 100 Feet Assistive device: Rolling walker Ambulation/Gait Assistance Details: vc for safe use of RW (pt tending to push too far ahead of herself); pt inquiring re: rollator with seat and explained why that gives less support than two-wheeled RW; vc for choosing appropriate visual targets to minimize dizziness/dysequilibrium (pt tending to look at moving targets instead of  stationary) Gait Pattern: Step-through pattern Gait velocity: significantly decr Modified Rankin (Stroke Patients Only) Pre-Morbid Rankin Score: No symptoms Modified Rankin: Moderately severe disability    Exercises Other Exercises Other Exercises: Pt reports she completed x 1 exercises last night and this morning--unsure how many seconds she could tolerate at a time Other Exercises: Once pt in recliner, rolled pt out into hallway to simulate riding in a car to educate re: use of visual targets and assess her tolerance--which was very good. Pt should be able to tolerate riding in the car to OPPT (vestibular rehab)   PT Diagnosis:    PT Problem List:   PT Treatment Interventions:     PT Goals Acute Rehab PT Goals Pt will go Supine/Side to Sit: with modified independence;with HOB 0 degrees;Other (comment) PT Goal: Supine/Side to Sit - Progress: Partly met Pt will go Sit to Stand: with modified independence;with upper extremity assist;Other (comment) PT Goal: Sit to Stand - Progress: Progressing toward goal Pt will Ambulate: 51 - 150 feet;with supervision;with least restrictive assistive device;Other (comment) PT Goal: Ambulate - Progress: Progressing toward goal Additional Goals Additional Goal #1: Pt able to independently utilize gaze stabilization techniques to decr symptoms PT Goal: Additional Goal #1 - Progress: Progressing toward goal Additional Goal #2: Pt able to perform x 1 exercises in horizontal and vertical planes in sititng x 30 seconds with dizziness < 7/10 PT Goal: Additional Goal #2 - Progress: Progressing toward goal  Visit Information  Last PT Received On: 02/28/12 Assistance Needed: +1    Subjective Data  Subjective: Denies any further spinning; still feels like she is "floating" Patient Stated Goal: stop feeling dizzy; not throw up; go home   Cognition  Overall Cognitive Status: Appears within functional limits for tasks assessed/performed  Arousal/Alertness:  Awake/alert Orientation Level: Appears intact for tasks assessed Behavior During Session: [facility] for tasks performed    Balance     End of Session PT - End of Session Equipment Utilized During Treatment: Gait belt Activity Tolerance: Other (comment) (dizziness 5/10 throughout; 0/10 nausea) Patient left: in chair;with call bell/phone within reach   GP     Abdifatah Colquhoun 02/28/2012, 12:16 PM

## 2012-02-28 NOTE — Progress Notes (Signed)
Internal Medicine Teaching Service Attending Note Date: 02/28/2012  Patient name: Jill Hancock  Medical record number: [id number]  Date of birth: 04/24/1942    This patient has been seen and discussed with the house staff. Please see their note for complete details. I concur with their findings with the following additions/corrections: [name] is feeling a bit better bit still unsteady on her feet. PT has rec outpt PT and pt is willing. We started antiHTN yesterday and her BP decreased to 132/70 so we held her meds today. She prefers to try Lisinopril over the amlodipine so will start low dose lisinopril in the AM. D/C in AM.   [facility] 02/28/2012, 3:11 PM

## 2012-02-28 NOTE — Progress Notes (Signed)
Patient said, ' I am having severe gas pain'. Notified on call doctor. Received order for Maalox.

## 2012-02-28 NOTE — Progress Notes (Signed)
Subjective: Jill Hancock was seen and examined by bedside.  She is still complaining of feeling unbalance but improved vertigo today.  Jill Hancock claims to have felt some mild crampy abdominal pain yesterday and felt like she had gas, but improved today and had loose BM yesterday as well but resolved today.  She is able to tolerate her meals but continues to feel anxious/nervous in regards to her feeling of imbalance.  She was about work with physical therapy again today and was supposed to have [employer] testing done yesterday but felt like she could not tolerate and per PT did not seem to have much vertigo symptoms but more imbalance.  She would like to follow up with me in [facility] as an outpatient, will have blood drawn prior to visit to monitor electrolytes and BP check.  Jill Hancock currently denies vomiting, constipation, headaches, chest pain, shortness of breath, or abdominal pain at this time.    Objective: Vital signs in last 24 hours: Filed Vitals:   02/27/12 1849 02/27/12 2128 02/28/12 0213 02/28/12 0530  BP: 162/84 150/99 135/74 132/70  Pulse: 70 70 70 71  Temp: 97.3 F (36.3 C) 97.7 F (36.5 C) 97.2 F (36.2 C) 97.1 F (36.2 C)  TempSrc: Oral Oral Oral Oral  Resp: 16 18 17 18   Height:      Weight:      SpO2: 98% 100% 100% 100%   Weight change:   Intake/Output Summary (Last 24 hours) at 02/28/12 0730 Last data filed at 02/27/12 2245  Gross per 24 hour  Intake      0 ml  Output      1 ml  Net     -1 ml   Physical Exam:   Vitals reviewed.  General: resting in bed, complaining of imblance HEENT: PERRLA, EOMI, no scleral icterus Cardiac: RRR, no rubs, murmurs or gallops  Pulm: clear to auscultation bilaterally, no wheezes, rales, or rhonchi  Abd: soft, nontender, nondistended, +BS present  Ext: warm and well perfused, no pedal edema  Neuro: alert and oriented X3, cranial nerves II-XII grossly intact, strength and sensation to light touch equal in bilateral upper and  lower extremities. Gait assessment deferred, good heel to shin b/l, good to thumb to fingers touch.    Lab Results: Basic Metabolic Panel:  Lab 02/27/12 4782 02/27/12 0641  NA 141 145  K 3.1* 2.8*  CL 102 105  CO2 30 28  GLUCOSE 120* 105*  BUN 12 12  CREATININE 0.73 0.68  CALCIUM 9.6 9.4  MG -- 1.9  PHOS -- --   Liver Function Tests:  Lab 02/25/12 1006  AST 24  ALT 21  ALKPHOS 48  BILITOT 0.5  PROT 6.3  ALBUMIN 3.2*   CBC:  Lab 02/27/12 0641 02/25/12 1006  WBC 10.7* 10.4  NEUTROABS -- 8.0*  HGB 13.6 12.2  HCT 39.1 35.7*  MCV 84.8 85.4  PLT 288 275   Cardiac Enzymes:  Lab 02/25/12 1006  CKTOTAL 386*  CKMB 6.0*  CKMBINDEX --  TROPONINI <0.30   Fasting Lipid Panel:  Lab 02/26/12 0640  CHOL 171  HDL 50  LDLCALC 96  TRIG 125  CHOLHDL 3.4  LDLDIRECT --   Coagulation:  Lab 02/25/12 1006  LABPROT 14.3  INR 1.09   Urinalysis:  Lab 02/24/12 1655  COLORURINE YELLOW  LABSPEC 1.016  PHURINE 7.5  GLUCOSEU NEGATIVE  HGBUR NEGATIVE  BILIRUBINUR NEGATIVE  KETONESUR NEGATIVE  PROTEINUR NEGATIVE  UROBILINOGEN 0.2  NITRITE NEGATIVE  LEUKOCYTESUR NEGATIVE   Studies/Results: *RADIOLOGY REPORT*  Clinical Data: Hypertension. Sudden onset of nausea, vomiting,  and dizziness. History of vertigo.  MRI HEAD WITHOUT CONTRAST  MRA HEAD WITHOUT CONTRAST  Technique: Multiplanar, multiecho pulse sequences of the brain and  surrounding structures were obtained without intravenous contrast.  Angiographic images of the head were obtained using MRA technique  without contrast.  Comparison: None  MRI HEAD  Findings: There are three small acute areas of cerebral  infarction, and one subcentimeter area of subacute infarction. The  lesions which are most likely be responsible for vertigo is located  in the left cerebellar white matter dorsal to the fourth ventricle  (image 7 series 3, approximately 3 x 5 mm), and/or in the left  paramedian midbrain (image 16 series  5, 3 x 3 mm). The third  lesion, in a different vascular territory, is seen in the right  frontal periventricular white matter just superior to the caudate  nucleus, approximately 3 x 4 mm cross-section. A fourth lesion  with low level restricted diffusion is seen in the left occipital  subcortical white matter, likely days to a few weeks old.  There is no mass lesion or acute hemorrhage. There is no  hydrocephalus or extra-axial fluid. Gradient echo images,  sensitive to chronic hemorrhage, show numerous supratentorial and  infratentorial areas of subcentimeter chronic hemorrhage. These  are consistent with longstanding cerebrovascular disease secondary  hypertension (microbleeds).  There is mild to moderate age related atrophy. Numerous chronic  lacunar infarcts are seen in the centrum semiovale. There is  extensive chronic microvascular ischemic change affecting primarily  the supratentorial subcortical and periventricular white matter,  also likely sequelae of hypertensive cerebrovascular disease.  No midline shift. Normal pituitary and cerebellar tonsils.  Unremarkable skull base and calvarium. No acute sinus or mastoid  disease.  IMPRESSION:  At least three areas of subcentimeter acute infarction with one  area of subacute infarction. These involve widely disparate  vascular territories. 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  MRA HEAD  Findings: The internal carotid arteries are widely patent. The  basilar artery shows mild nonstenotic irregularity just cephalad to  the origin of the anterior inferior cerebellar artery. The right  vertebral is the dominant contributor to the basilar without focal  stenosis. 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 Both posterior  cerebral  arteries originate from the carotids. There is no proximal  stenosis of the anterior or middle cerebral arteries. There is  moderately severe narrowing of the right PCA in its ambient P2  segment. There is no visible intracranial aneurysm or dissection.  There is no visible cerebellar branch occlusion.  IMPRESSION:  Intracranial atherosclerotic changes as described. Potentially  flow limiting stenosis right PCA, not accompanied by acute cerebral  infarction however.  Original Report Authenticated By: Elsie Stain, M.D.  *RADIOLOGY REPORT*  Clinical Data: CVA.  PORTABLE CHEST - 1 VIEW  Comparison: None.  Findings: The heart is borderline enlarged. There is tortuosity  and mild ectasia of the thoracic aorta. The lungs are clear. No  pleural effusion. The bony thorax is intact.  IMPRESSION:  1. Mild cardiac enlargement.  2. No acute pulmonary findings.  Original Report Authenticated By: P. Loralie Champagne, M.D.  [facility]* *[facility]* [address] [place], [place] [address] [phone]  ------------------------------------------------------------  Transthoracic Echocardiography  Patient: Melyssa, Signor MR #: [id number] Study Date: 02/26/2012 Gender: F Age: 75 Height: 165.1cm Weight: 79.4kg BSA: 1.23m^2 Pt. Status: Room: 4N26C  PERFORMING [facility] SONOGRAPHER Sherren Kerns, RVS ATTENDING Lorenso Quarry, Zenia Resides SONOGRAPHER Georgian Co, RDCS, CCT cc:  ------------------------------------------------------------ LV EF: 55% - 60%  ------------------------------------------------------------ Indications: CVA 436.  ------------------------------------------------------------ History: Risk factors: vomiting and dizziness. Hypertension.  ------------------------------------------------------------ Study Conclusions  - Left ventricle: The cavity size  was normal. Systolic function was normal. The estimated ejection fraction was in the range of 55% to 60%. Wall motion was normal; there were no regional wall motion abnormalities. Doppler parameters are consistent with abnormal left ventricular relaxation (grade 1 diastolic dysfunction). - Right ventricle: The cavity size was mildly dilated. Wall thickness was normal. Impressions: - No cardiac source of emboli was indentified. Recommendations: Consider transesophageal echocardiography if clinically indicated. Transthoracic echocardiography. M-mode, complete 2D, spectral Doppler, and color Doppler. Height: Height: 165.1cm. Height: 65in. Weight: Weight: 79.4kg. Weight: 174.6lb. Body mass index: BMI: 29.1kg/m^2. Body surface area: BSA: 1.84m^2. Blood pressure: 173/89. Patient status: Inpatient. Location: Bedside.  ------------------------------------------------------------  ------------------------------------------------------------ Left ventricle: The cavity size was normal. Systolic function was normal. The estimated ejection fraction was in the range of 55% to 60%. Wall motion was normal; there were no regional wall motion abnormalities. Doppler parameters are consistent with abnormal left ventricular relaxation (grade 1 diastolic dysfunction).  ------------------------------------------------------------ Aortic valve: Mildly thickened, mildly calcified leaflets. Cusp separation was normal. Doppler: Transvalvular velocity was within the normal range. There was no stenosis. No regurgitation.  ------------------------------------------------------------ Aorta: The aorta was normal, not dilated, and non-diseased.  ------------------------------------------------------------ Mitral valve: Structurally normal valve. Leaflet separation was normal. Doppler: Transvalvular velocity was within the normal range. There was no evidence for stenosis. No regurgitation. Peak gradient: 2mm  Hg (D).  ------------------------------------------------------------ Left atrium: The atrium was at the upper limits of normal in size.  ------------------------------------------------------------ Right ventricle: The cavity size was mildly dilated. Wall thickness was normal. Systolic function was normal.  ------------------------------------------------------------ Pulmonic valve: Poorly visualized. Structurally normal valve. Cusp separation was normal. Doppler: Transvalvular velocity was within the normal range. No regurgitation.  ------------------------------------------------------------ Tricuspid valve: Structurally normal valve. Leaflet separation was normal. Doppler: No regurgitation.  ------------------------------------------------------------ Pulmonary artery: Poorly visualized. The main pulmonary artery was normal-sized.  ------------------------------------------------------------ Right atrium: The atrium was normal in size.  ------------------------------------------------------------ Pericardium: The pericardium was normal in appearance. There was no pericardial effusion.  ------------------------------------------------------------ Systemic veins: Inferior vena cava: The vessel was normal in size; the respirophasic diameter changes were in the normal range (= 50%); findings are consistent with normal central venous pressure.  ------------------------------------------------------------ Post procedure conclusions Ascending Aorta:  - The aorta was normal, not dilated, and non-diseased.  ------------------------------------------------------------  2D measurements Normal Doppler Normal Left ventricle measurements LVID ED, 41.2 mm 43-52 Left ventricle chord, Ea, lat 6.39 cm/ ------- PLAX ann, tiss s LVID ES, 28.8 mm 23-38 DP chord, E/Ea, lat 11.82 ------- PLAX ann, tiss FS, chord, 30 % >29 DP PLAX Ea, med 4.61 cm/ ------- LVPW, ED 8 mm ------ ann,  tiss s IVS/LVPW 1.06 <1.3 DP ratio, ED E/Ea, med 16.38 ------- Ventricular septum ann, tiss IVS, ED 8.51 mm ------ DP Aorta Mitral valve Root diam, 32 mm ------ Peak E vel 75.5 cm/ ------- ED s Left atrium Peak A vel 95.8 cm/ ------- AP dim 40 mm ------ s AP dim 2.14 cm/m^2 <2.2 Deceleratio 227 ms 150-230 index n time Vol, S 55 ml ------  Peak 2 mm ------- Vol index, 29.4 ml/m^2 ------ gradient, D Hg S Peak E/A 0.8 ------- Right ventricle ratio RVID ED, 29.5 mm 19-38 Systemic veins PLAX Estimated 5 mm ------- CVP Hg Right ventricle Sa vel, lat 13.5 cm/ ------- ann, tiss s DP  ------------------------------------------------------------ Prepared and Electronically Authenticated by  Donato Schultz 2013-08-26T17:32:38.160  Noninvasive Vascular Lab  Carotid Duplex Study  Patient: Annaclaire, Walsworth MR #: [id number] Study Date: 02/26/2012 Gender: F Age: 55 Height: Weight: BSA: Pt. Status: Room: 4N26C  ATTENDING Lorenso Quarry, Lind Covert ADMITTING Ulyess Mort Reports also to:  ------------------------------------------------------------ History and indications:  Indications  CVA/Stroke. 780.4 Dizziness and giddiness. 781.2 Abnormality of gait. History  Diagnostic evaluation. Risk factors: Hypertension. Hyperlipidemia.  ------------------------------------------------------------ Study information:  Study status: Routine. Procedure: A vascular evaluation was performed with the patient in the supine position. Image quality was adequate. The right CCA, right ECA, right ICA, right vertebral, left CCA, left ECA, left ICA, and left vertebral arteries were examined. Carotid duplex study. Carotid Duplex exam including 2D imaging, color and spectral Doppler were performed on the extracranial carotid and vertebral arteries using standard established protocols. Location: Bedside. Patient status:  Inpatient.  Arterial flow:  +--------------------+--------+-------+ Location V sys V ed  +--------------------+--------+-------+ Right CCA - proximal55cm/s 13cm/s  +--------------------+--------+-------+ Right CCA - distal 46cm/s 13cm/s  +--------------------+--------+-------+ Right ECA -141cm/s-21cm/s +--------------------+--------+-------+ Right ICA - proximal-57cm/s -13cm/s +--------------------+--------+-------+ Right ICA - distal -55cm/s -12cm/s +--------------------+--------+-------+ Right vertebral 46cm/s 14cm/s  +--------------------+--------+-------+ Left CCA - proximal 93cm/s 24cm/s  +--------------------+--------+-------+ Left CCA - distal 61cm/s 11cm/s  +--------------------+--------+-------+ Left ECA -89cm/s -11cm/s +--------------------+--------+-------+ Left ICA - proximal -49cm/s -12cm/s +--------------------+--------+-------+ Left ICA - distal -62cm/s -24cm/s +--------------------+--------+-------+ Left vertebral -47cm/s -12cm/s +--------------------+--------+-------+  ------------------------------------------------------------ Summary: Right: mild wall thickening. Left: mild soft plaque origin ICA. Bilateral: no significant ICA stenosis. Vertebral artery flow is antegrade.  Other specific details can be found in the table(s) above. Prepared and Electronically Authenticated by  Delia Heady 2013-08-27T09:23:56.660  Medications: I have reviewed the patient's current medications. Scheduled Meds:    . amLODipine  5 mg Oral Daily  . aspirin  300 mg Rectal Daily   Or  . aspirin  325 mg Oral Daily  . heparin  5,000 Units Subcutaneous Q8H  . hydrochlorothiazide  12.5 mg Oral Daily  . lidocaine  1 patch Transdermal Q24H  . metFORMIN  500 mg Oral Q breakfast  . potassium chloride  40 mEq Oral Q6H  . simvastatin  10 mg Oral q1800  . DISCONTD: potassium chloride  40 mEq Oral BID  . DISCONTD:  simvastatin  20 mg Oral q1800   Continuous Infusions:  PRN Meds:.acetaminophen, acetaminophen, alum & mag hydroxide-simeth, diazepam, meclizine, promethazine, senna-docusate  Assessment/Plan: 71 year old woman with past medical history significant for HTN, vertigo for 12-14 years comes to the ER with complaints of vertigo, nausea and vomiting x3 days. She presented to the ED 02/24/12 and had MRI that confirmed the diagnosis of acute ischemic stroke with multiple infarctions.   1.Acute ischemic stroke: Presented with symptoms of vertigo , nausea and vomiting. On our exam ,she is leaning towards right with ? left abnormal finger to nose. She had MRI that showed three small areas of acute cerebral infarction and an area in the territory of left cerebellum and around right caudate nucleus. Given the distribution of her stroke, we are suspicious towards stroke being secondary to small vessel disease vs. embolic in origin. She used to take ASA off and on at  home.  - Neuro following: continue ASA 325mg  PO QD for secondary stroke prevention, aggressive risk factor management, f/u Dr. Pearlean Brownie in 2 months, Outpatient vestibular rehab - Neuro checks  - 2D echo--EF 55-60%, normal systolic function, normal wall motion, RV mildly dilated - f/u Carotid dopplers--Right: mild wall thickening. Left: mild soft plaque origin ICA. Bilateral: no significant ICA stenosis. Vertebral artery flow is antegrade. - Risk stratification - AIC 5.9: , Lipid panel: Chol 171, LDL: 96, TG:125, HDL:50  - Symptomatic treatment of vertigo with PRN valium, meclizine  - Phenergan for nausea.  - Continue Simvastatin 10mg  PO QD--goal of 30% LDL reduction - f/u PT recommendations--outpatient PT, supervision for mobility/OOB with rolling walker with 5" wheels.  2. HTN: Given Cozaar 50mg  x1 in ED. Hx of uncontrolled HTN with Hyzaar 50-12.5mg  at home  - Discontinue Amlodipine 5 and HCTZ 12.5 - hold all HTN meds today - start Lisinopril 5mg  PO  QD 02/29/12 - monitor vitals   3. Hypokalemia: likely 2/2 diuretics, chronic.  Was on home dose of daily Kdur - Mg: 1.9  - repleted--K 4.2 today - f/u Aldosterone+renin activity for possible cause of Chronic hypokalemia -likely d/c with Kdur 40mg  PO BID and recheck blood work in one week s/p discharge to re-evaluate  4. Hyperglycemia  -Continue Metfomin 500mg  PO QD -monitor blood glucose level  DVT: heparin  Diet: heart healthy  Dispo: pending clinical improvement, neuro and PT recommendations.  Will follow up in [facility] 03/13/12   LOS: 4 days   Darden Palmer 02/28/2012, 7:30 AM

## 2012-02-29 ENCOUNTER — Other Ambulatory Visit: Payer: Self-pay | Admitting: Internal Medicine

## 2012-02-29 DIAGNOSIS — E119 Type 2 diabetes mellitus without complications: Secondary | ICD-10-CM

## 2012-02-29 DIAGNOSIS — E876 Hypokalemia: Secondary | ICD-10-CM

## 2012-02-29 DIAGNOSIS — I10 Essential (primary) hypertension: Secondary | ICD-10-CM

## 2012-02-29 LAB — BASIC METABOLIC PANEL
BUN: 15 mg/dL (ref 6–23)
Calcium: 9.6 mg/dL (ref 8.4–10.5)
Creatinine, Ser: 0.8 mg/dL (ref 0.50–1.10)
GFR calc Af Amer: 85 mL/min — ABNORMAL LOW (ref >90)
GFR calc non Af Amer: 73 mL/min — ABNORMAL LOW (ref >90)
Glucose, Bld: 99 mg/dL (ref 70–99)
Potassium: 3.1 mEq/L — ABNORMAL LOW (ref 3.5–5.1)

## 2012-02-29 LAB — CBC
HCT: 39.5 % (ref 36.0–46.0)
MCH: 29.5 pg (ref 26.0–34.0)
MCHC: 34.4 g/dL (ref 30.0–36.0)
RDW: 13.8 % (ref 11.5–15.5)
WBC: 9.8 10*3/uL (ref 4.0–10.5)

## 2012-02-29 MED ORDER — LIDOCAINE 5 % EX PTCH
1.0000 | MEDICATED_PATCH | CUTANEOUS | Status: DC
  Administered 2012-02-29: 1 via TRANSDERMAL
  Filled 2012-02-29 (×2): qty 1

## 2012-02-29 MED ORDER — LIDOCAINE 5 % EX PTCH: 1.0000 | MEDICATED_PATCH | CUTANEOUS | Status: AC

## 2012-02-29 MED ORDER — MECLIZINE HCL 25 MG PO TABS
25.0000 mg | ORAL_TABLET | Freq: Three times a day (TID) | ORAL | Status: DC | PRN
Start: 2012-02-29 — End: 2012-05-08

## 2012-02-29 MED ORDER — POTASSIUM CHLORIDE ER 10 MEQ PO TBCR: 20.0000 meq | EXTENDED_RELEASE_TABLET | Freq: Two times a day (BID) | ORAL | Status: DC

## 2012-02-29 MED ORDER — ESOMEPRAZOLE MAGNESIUM 20 MG PO CPDR: 20.0000 mg | DELAYED_RELEASE_CAPSULE | Freq: Every day | ORAL | Status: DC

## 2012-02-29 MED ORDER — METFORMIN HCL 500 MG PO TABS: 500.0000 mg | ORAL_TABLET | Freq: Every day | ORAL | Status: DC

## 2012-02-29 MED ORDER — POTASSIUM CHLORIDE CRYS ER 20 MEQ PO TBCR
40.0000 meq | EXTENDED_RELEASE_TABLET | Freq: Once | ORAL | Status: DC
  Filled 2012-02-29: qty 2

## 2012-02-29 MED ORDER — LISINOPRIL 5 MG PO TABS: 5.0000 mg | ORAL_TABLET | Freq: Every day | ORAL | Status: DC

## 2012-02-29 MED ORDER — ASPIRIN 325 MG PO TABS: 325.0000 mg | ORAL_TABLET | Freq: Every day | ORAL | Status: AC

## 2012-02-29 MED ORDER — PRAVASTATIN SODIUM 20 MG PO TABS: 20.0000 mg | ORAL_TABLET | Freq: Every day | ORAL | Status: DC

## 2012-02-29 NOTE — Progress Notes (Signed)
Subjective: Ms. Kuenzel was seen and examined by bedside.  She claims to notice some improvement in feeling of imbalance today and since admission.  She will go to outpatient PT after discharge and has been given a walker as per PT.  We have discussed Ms. Yadav's medications in length with her and her husband and will also continue to follow her in [facility].  She also claims to have had one soft BM yesterday after lunch and one this morning.  Ms. Arentz currently denies any vomiting, constipation, headaches, chest pain, shortness of breath, or abdominal pain at this time.    Objective: Vital signs in last 24 hours: Filed Vitals:   02/28/12 1857 02/28/12 2200 02/29/12 0200 02/29/12 0600  BP: 161/94 157/92 171/83 150/73  Pulse: 80 85 78 68  Temp: 97.8 F (36.6 C) 98.5 F (36.9 C) 97.6 F (36.4 C) 97.8 F (36.6 C)  TempSrc: Oral     Resp: 18 18 18 18   Height:      Weight:      SpO2: 96% 96% 99% 97%   Weight change:  No intake or output data in the 24 hours ending 02/29/12 0926 Physical Exam:   Vitals reviewed.  General: resting in bed, NAD HEENT: PERRLA, EOMI, no scleral icterus Cardiac: RRR, no rubs, murmurs or gallops  Pulm: clear to auscultation bilaterally, no wheezes, rales, or rhonchi  Abd: soft, nontender, nondistended, +BS present  Ext: warm and well perfused, no pedal edema  Neuro: alert and oriented X3, cranial nerves II-XII grossly intact, strength and sensation to light touch equal in bilateral upper and lower extremities. Gait assessment deferred, good heel to shin b/l, good to thumb to fingers touch.    Lab Results: Basic Metabolic Panel:  Lab 02/29/12 1610 02/28/12 0630 02/27/12 0641  NA 139 143 --  K 3.1* 4.2 --  CL 101 103 --  CO2 28 30 --  GLUCOSE 99 112* --  BUN 15 15 --  CREATININE 0.80 0.78 --  CALCIUM 9.6 9.9 --  MG -- -- 1.9  PHOS -- -- --   Liver Function Tests:  Lab 02/25/12 1006  AST 24  ALT 21  ALKPHOS 48  BILITOT 0.5  PROT 6.3  ALBUMIN  3.2*   CBC:  Lab 02/29/12 0703 02/28/12 0630 02/25/12 1006  WBC 9.8 11.3* --  NEUTROABS -- -- 8.0*  HGB 13.6 14.1 --  HCT 39.5 41.1 --  MCV 85.7 86.2 --  PLT 303 309 --   Cardiac Enzymes:  Lab 02/25/12 1006  CKTOTAL 386*  CKMB 6.0*  CKMBINDEX --  TROPONINI <0.30   Fasting Lipid Panel:  Lab 02/26/12 0640  CHOL 171  HDL 50  LDLCALC 96  TRIG 125  CHOLHDL 3.4  LDLDIRECT --   Coagulation:  Lab 02/25/12 1006  LABPROT 14.3  INR 1.09   Urinalysis:  Lab 02/24/12 1655  COLORURINE YELLOW  LABSPEC 1.016  PHURINE 7.5  GLUCOSEU NEGATIVE  HGBUR NEGATIVE  BILIRUBINUR NEGATIVE  KETONESUR NEGATIVE  PROTEINUR NEGATIVE  UROBILINOGEN 0.2  NITRITE NEGATIVE  LEUKOCYTESUR NEGATIVE   Studies/Results: *RADIOLOGY REPORT*  Clinical Data: Hypertension. Sudden onset of nausea, vomiting,  and dizziness. History of vertigo.  MRI HEAD WITHOUT CONTRAST  MRA HEAD WITHOUT CONTRAST  Technique: Multiplanar, multiecho pulse sequences of the brain and  surrounding structures were obtained without intravenous contrast.  Angiographic images of the head were obtained using MRA technique  without contrast.  Comparison: None  MRI HEAD  Findings: There are three small  acute areas of cerebral  infarction, and one subcentimeter area of subacute infarction. The  lesions which are most likely be responsible for vertigo is located  in the left cerebellar white matter dorsal to the fourth ventricle  (image 7 series 3, approximately 3 x 5 mm), and/or in the left  paramedian midbrain (image 16 series 5, 3 x 3 mm). The third  lesion, in a different vascular territory, is seen in the right  frontal periventricular white matter just superior to the caudate  nucleus, approximately 3 x 4 mm cross-section. A fourth lesion  with low level restricted diffusion is seen in the left occipital  subcortical white matter, likely days to a few weeks old.  There is no mass lesion or acute hemorrhage. There is no   hydrocephalus or extra-axial fluid. Gradient echo images,  sensitive to chronic hemorrhage, show numerous supratentorial and  infratentorial areas of subcentimeter chronic hemorrhage. These  are consistent with longstanding cerebrovascular disease secondary  hypertension (microbleeds).  There is mild to moderate age related atrophy. Numerous chronic  lacunar infarcts are seen in the centrum semiovale. There is  extensive chronic microvascular ischemic change affecting primarily  the supratentorial subcortical and periventricular white matter,  also likely sequelae of hypertensive cerebrovascular disease.  No midline shift. Normal pituitary and cerebellar tonsils.  Unremarkable skull base and calvarium. No acute sinus or mastoid  disease.  IMPRESSION:  At least three areas of subcentimeter acute infarction with one  area of subacute infarction. These involve widely disparate  vascular territories. 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  MRA HEAD  Findings: The internal carotid arteries are widely patent. The  basilar artery shows mild nonstenotic irregularity just cephalad to  the origin of the anterior inferior cerebellar artery. The right  vertebral is the dominant contributor to the basilar without focal  stenosis. 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 Both posterior cerebral  arteries originate from the carotids. There is no proximal  stenosis of the anterior or middle cerebral arteries. There is  moderately severe narrowing of the right PCA in its ambient P2  segment. There is no visible intracranial aneurysm or dissection.  There is no visible cerebellar branch occlusion.  IMPRESSION:  Intracranial atherosclerotic changes as described. Potentially  flow  limiting stenosis right PCA, not accompanied by acute cerebral  infarction however.  Original Report Authenticated By: Elsie Stain, M.D.  *RADIOLOGY REPORT*  Clinical Data: CVA.  PORTABLE CHEST - 1 VIEW  Comparison: None.  Findings: The heart is borderline enlarged. There is tortuosity  and mild ectasia of the thoracic aorta. The lungs are clear. No  pleural effusion. The bony thorax is intact.  IMPRESSION:  1. Mild cardiac enlargement.  2. No acute pulmonary findings.  Original Report Authenticated By: P. Loralie Champagne, M.D.  [facility]* *[facility]* [address] [place], [place] [address] [phone]  ------------------------------------------------------------ Transthoracic Echocardiography  Patient: Trista, Ciocca MR #: [id number] Study Date: 02/26/2012 Gender: F Age: 27 Height: 165.1cm Weight: 79.4kg BSA: 1.30m^2 Pt. Status: Room: 4N26C  PERFORMING [facility] SONOGRAPHER Sherren Kerns, RVS ATTENDING Lorenso Quarry, Zenia Resides SONOGRAPHER Georgian Co, RDCS, CCT cc:  ------------------------------------------------------------ LV EF: 55% - 60%  ------------------------------------------------------------ Indications: CVA 436.  ------------------------------------------------------------ History: Risk factors: vomiting and dizziness. Hypertension.  ------------------------------------------------------------ Study Conclusions  -  Left ventricle: The cavity size was normal. Systolic function was normal. The estimated ejection fraction was in the range of 55% to 60%. Wall motion was normal; there were no regional wall motion abnormalities. Doppler parameters are consistent with abnormal left ventricular relaxation (grade 1 diastolic dysfunction). - Right ventricle: The cavity size was mildly dilated. Wall thickness was normal. Impressions: - No cardiac  source of emboli was indentified. Recommendations: Consider transesophageal echocardiography if clinically indicated. Transthoracic echocardiography. M-mode, complete 2D, spectral Doppler, and color Doppler. Height: Height: 165.1cm. Height: 65in. Weight: Weight: 79.4kg. Weight: 174.6lb. Body mass index: BMI: 29.1kg/m^2. Body surface area: BSA: 1.67m^2. Blood pressure: 173/89. Patient status: Inpatient. Location: Bedside.  ------------------------------------------------------------  ------------------------------------------------------------ Left ventricle: The cavity size was normal. Systolic function was normal. The estimated ejection fraction was in the range of 55% to 60%. Wall motion was normal; there were no regional wall motion abnormalities. Doppler parameters are consistent with abnormal left ventricular relaxation (grade 1 diastolic dysfunction).  ------------------------------------------------------------ Aortic valve: Mildly thickened, mildly calcified leaflets. Cusp separation was normal. Doppler: Transvalvular velocity was within the normal range. There was no stenosis. No regurgitation.  ------------------------------------------------------------ Aorta: The aorta was normal, not dilated, and non-diseased.  ------------------------------------------------------------ Mitral valve: Structurally normal valve. Leaflet separation was normal. Doppler: Transvalvular velocity was within the normal range. There was no evidence for stenosis. No regurgitation. Peak gradient: 2mm Hg (D).  ------------------------------------------------------------ Left atrium: The atrium was at the upper limits of normal in size.  ------------------------------------------------------------ Right ventricle: The cavity size was mildly dilated. Wall thickness was normal. Systolic function was normal.  ------------------------------------------------------------ Pulmonic valve: Poorly  visualized. Structurally normal valve. Cusp separation was normal. Doppler: Transvalvular velocity was within the normal range. No regurgitation.  ------------------------------------------------------------ Tricuspid valve: Structurally normal valve. Leaflet separation was normal. Doppler: No regurgitation.  ------------------------------------------------------------ Pulmonary artery: Poorly visualized. The main pulmonary artery was normal-sized.  ------------------------------------------------------------ Right atrium: The atrium was normal in size.  ------------------------------------------------------------ Pericardium: The pericardium was normal in appearance. There was no pericardial effusion.  ------------------------------------------------------------ Systemic veins: Inferior vena cava: The vessel was normal in size; the respirophasic diameter changes were in the normal range (= 50%); findings are consistent with normal central venous pressure.  ------------------------------------------------------------ Post procedure conclusions Ascending Aorta:  - The aorta was normal, not dilated, and non-diseased.  ------------------------------------------------------------  2D measurements Normal Doppler Normal Left ventricle measurements LVID ED, 41.2 mm 43-52 Left ventricle chord, Ea, lat 6.39 cm/ ------- PLAX ann, tiss s LVID ES, 28.8 mm 23-38 DP chord, E/Ea, lat 11.82 ------- PLAX ann, tiss FS, chord, 30 % >29 DP PLAX Ea, med 4.61 cm/ ------- LVPW, ED 8 mm ------ ann, tiss s IVS/LVPW 1.06 <1.3 DP ratio, ED E/Ea, med 16.38 ------- Ventricular septum ann, tiss IVS, ED 8.51 mm ------ DP Aorta Mitral valve Root diam, 32 mm ------ Peak E vel 75.5 cm/ ------- ED s Left atrium Peak A vel 95.8 cm/ ------- AP dim 40 mm ------ s AP dim 2.14 cm/m^2 <2.2 Deceleratio 227 ms 150-230 index n time Vol, S 55 ml ------ Peak 2 mm ------- Vol index, 29.4 ml/m^2 ------  gradient, D Hg S Peak E/A 0.8 ------- Right ventricle ratio RVID ED, 29.5 mm 19-38 Systemic veins PLAX Estimated 5 mm ------- CVP Hg Right ventricle Sa vel, lat 13.5 cm/ ------- ann, tiss s DP  ------------------------------------------------------------ Prepared and Electronically Authenticated by  Donato Schultz 2013-08-26T17:32:38.160  Noninvasive Vascular Lab  Carotid Duplex Study  Patient: Hafsah, Hendler MR #: [id number] Study Date: 02/26/2012 Gender: F  Age: 27 Height: Weight: BSA: Pt. Status: Room: 4N26C  ATTENDING Lorenso Quarry, [facility] [place], [place] ADMITTING Ulyess Mort Reports also to:  ------------------------------------------------------------ History and indications:  Indications  CVA/Stroke. 780.4 Dizziness and giddiness. 781.2 Abnormality of gait. History  Diagnostic evaluation. Risk factors: Hypertension. Hyperlipidemia.  ------------------------------------------------------------ Study information:  Study status: Routine. Procedure: A vascular evaluation was performed with the patient in the supine position. Image quality was adequate. The right CCA, right ECA, right ICA, right vertebral, left CCA, left ECA, left ICA, and left vertebral arteries were examined. Carotid duplex study. Carotid Duplex exam including 2D imaging, color and spectral Doppler were performed on the extracranial carotid and vertebral arteries using standard established protocols. Location: Bedside. Patient status: Inpatient.  Arterial flow:  +--------------------+--------+-------+ Location V sys V ed  +--------------------+--------+-------+ Right CCA - proximal55cm/s 13cm/s  +--------------------+--------+-------+ Right CCA - distal 46cm/s 13cm/s  +--------------------+--------+-------+ Right ECA -141cm/s-21cm/s +--------------------+--------+-------+ Right ICA - proximal-57cm/s  -13cm/s +--------------------+--------+-------+ Right ICA - distal -55cm/s -12cm/s +--------------------+--------+-------+ Right vertebral 46cm/s 14cm/s  +--------------------+--------+-------+ Left CCA - proximal 93cm/s 24cm/s  +--------------------+--------+-------+ Left CCA - distal 61cm/s 11cm/s  +--------------------+--------+-------+ Left ECA -89cm/s -11cm/s +--------------------+--------+-------+ Left ICA - proximal -49cm/s -12cm/s +--------------------+--------+-------+ Left ICA - distal -62cm/s -24cm/s +--------------------+--------+-------+ Left vertebral -47cm/s -12cm/s +--------------------+--------+-------+  ------------------------------------------------------------ Summary: Right: mild wall thickening. Left: mild soft plaque origin ICA. Bilateral: no significant ICA stenosis. Vertebral artery flow is antegrade.  Other specific details can be found in the table(s) above. Prepared and Electronically Authenticated by  Delia Heady 2013-08-27T09:23:56.660  Medications: I have reviewed the patient's current medications. Scheduled Meds:    . aspirin  300 mg Rectal Daily   Or  . aspirin  325 mg Oral Daily  . heparin  5,000 Units Subcutaneous Q8H  . lidocaine  1 patch Transdermal Q24H  . lisinopril  5 mg Oral Daily  . metFORMIN  500 mg Oral Q breakfast  . pantoprazole  40 mg Oral Daily  . simvastatin  10 mg Oral q1800  . DISCONTD: amLODipine  2.5 mg Oral Daily   Continuous Infusions:  PRN Meds:.acetaminophen, acetaminophen, alum & mag hydroxide-simeth, diazepam, meclizine, promethazine, senna-docusate  Assessment/Plan: 71 year old woman with past medical history significant for HTN, vertigo for 12-14 years comes to the ER with complaints of vertigo, nausea and vomiting x3 days. She presented to the ED 02/24/12 and had MRI that confirmed the diagnosis of acute ischemic stroke with multiple infarctions.   1.Acute ischemic  stroke: Presented with symptoms of vertigo , nausea and vomiting. On our exam ,she is leaning towards right with ? left abnormal finger to nose. She had MRI that showed three small areas of acute cerebral infarction and an area in the territory of left cerebellum and around right caudate nucleus. Given the distribution of her stroke, we are suspicious towards stroke being secondary to small vessel disease vs. embolic in origin. She used to take ASA off and on at home.  - Neuro following: continue ASA 325mg  PO QD for secondary stroke prevention, aggressive risk factor management, f/u Dr. Pearlean Brownie in 2 months, Outpatient vestibular rehab - Neuro checks  - 2D echo--EF 55-60%, normal systolic function, normal wall motion, RV mildly dilated - f/u Carotid dopplers--Right: mild wall thickening. Left: mild soft plaque origin ICA. Bilateral: no significant ICA stenosis. Vertebral artery flow is antegrade. - Risk stratification - AIC 5.9: , Lipid panel: Chol 171, LDL: 96, TG:125, HDL:50  - Symptomatic treatment of vertigo with PRN valium, meclizine  - Phenergan for nausea.  -  Continue Simvastatin 10mg  PO QD--goal of 30% LDL reduction--change to pravastatin 20mg  PO QD as outpatient - f/u PT recommendations--outpatient PT, supervision for mobility/OOB with rolling walker with 5" wheels.  2. HTN: Given Cozaar 50mg  x1 in ED. Hx of uncontrolled HTN with Hyzaar 50-12.5mg  at home  - Continue Lisinopril 5mg  PO QD 02/29/12 - monitor vitals   3. Hypokalemia: likely 2/2 diuretics, chronic.  Was on home dose of daily Kdur - Mg: 1.9  - repleted--K 3.1 today - f/u Aldosterone+renin activity for possible cause of Chronic hypokalemia -likely d/c with Kdur PO BID and recheck blood work in one week s/p discharge to re-evaluate  4. Hyperglycemia  -Continue Metfomin 500mg  PO QD -monitor blood glucose level  DVT: heparin  Diet: heart healthy  Dispo: d/c to home today with f/u in opc and outpatient PT   LOS: 5 days    Darden Palmer 02/29/2012, 9:26 AM

## 2012-02-29 NOTE — Progress Notes (Signed)
Internal Medicine Teaching Service Attending Note Date: 02/29/2012  Patient name: Jill Hancock  Medical record number: [id number]  Date of birth: 04/24/1942    This patient has been seen and discussed with the house staff. Please see their note for complete details. I concur with their findings with the following additions/corrections: [name] is stable for D/C and F/U in [facility] and Dr Pearlean Brownie.   Tyresha Fede 02/29/2012, 12:45 PM

## 2012-02-29 NOTE — ED Provider Notes (Signed)
Medical screening examination/treatment/procedure(s) were performed by non-physician practitioner and as supervising physician I was immediately available for consultation/collaboration.  Hurman Horn, MD 02/29/12 1140

## 2012-02-29 NOTE — Discharge Instructions (Signed)
STROKE/TIA DISCHARGE INSTRUCTIONS SMOKING Cigarette smoking nearly doubles your risk of having a stroke & is the single most alterable risk factor  If you smoke or have smoked in the last 12 months, you are advised to quit smoking for your health.  Most of the excess cardiovascular risk related to smoking disappears within a year of stopping.  Ask you doctor about anti-smoking medications  [place] Quit Line: 1-800-QUIT NOW  Free Smoking Cessation Classes ([phone]  CHOLESTEROL Know your levels; limit fat & cholesterol in your diet  Lipid Panel  No results found for this basename: chol, trig, hdl, cholhdl, vldl, ldlcalc      Many patients benefit from treatment even if their cholesterol is at goal.  Goal: Total Cholesterol (CHOL) less than 160  Goal:  Triglycerides (TRIG) less than 150  Goal:  HDL greater than 40  Goal:  LDL (LDLCALC) less than 100   BLOOD PRESSURE American Stroke Association blood pressure target is less that 120/80 mm/Hg  Your discharge blood pressure is:  BP: 149/91 mmHg  Monitor your blood pressure  Limit your salt and alcohol intake  Many individuals will require more than one medication for high blood pressure  DIABETES (A1c is a blood sugar average for last 3 months) Goal HGBA1c is under 7% (HBGA1c is blood sugar average for last 3 months)  Diabetes: {STROKE DC DIABETES:22357}    No results found for this basename: HGBA1C     Your HGBA1c can be lowered with medications, healthy diet, and exercise.  Check your blood sugar as directed by your physician  Call your physician if you experience unexplained or low blood sugars.  PHYSICAL ACTIVITY/REHABILITATION Goal is 30 minutes at least 4 days per week    {STROKE DC ACTIVITY/REHAB:22359}  Activity decreases your risk of heart attack and stroke and makes your heart stronger.  It helps control your weight and blood pressure; helps you relax and can improve your mood.  Participate in a regular exercise  program.  Talk with your doctor about the best form of exercise for you (dancing, walking, swimming, cycling).  DIET/WEIGHT Goal is to maintain a healthy weight  Your discharge diet is: General *** liquids Your height is:  Height: 5\' 5"  (165.1 cm) Your current weight is: Weight: 79.8 kg (175 lb 14.8 oz) Your Body Mass Index (BMI) is:  BMI (Calculated): 29.3   Following the type of diet specifically designed for you will help prevent another stroke.  Your goal weight range is:  ***  Your goal Body Mass Index (BMI) is 19-24.  Healthy food habits can help reduce 3 risk factors for stroke:  High cholesterol, hypertension, and excess weight.  RESOURCES Stroke/Support Group:  Call [phone]  they meet the 3rd Sunday of the month on the Rehab Unit at [facility], [place] ( no meetings June, July & Aug).  STROKE EDUCATION PROVIDED/REVIEWED AND GIVEN TO PATIENT Stroke warning signs and symptoms How to activate emergency medical system (call 911). Medications prescribed at discharge. Need for follow-up after discharge. Personal risk factors for stroke. Pneumonia vaccine given:   {STROKE DC YES/NO/DATE:22363} Flu vaccine given:   {STROKE DC YES/NO/DATE:22363} My questions have been answered, the writing is legible, and I understand these instructions.  I will adhere to these goals & educational materials that have been provided to me after my discharge from the hospital.

## 2012-02-29 NOTE — Progress Notes (Signed)
Physical Therapy Treatment Patient Details Name: Jill Hancock MRN: [id number] DOB: 04/24/1942 Today's Date: 02/29/2012 Time: 6578-4696 PT Time Calculation (min): 18 min  PT Assessment / Plan / Recommendation Comments on Treatment Session  Patient continues to have some dizziness with ambulation but able to work her way through it. Patient adament about wanting a rollator for home use vs. a RW. Patient educated about safety concerns with use of rollator vs, RW but patient decline RW. Will recomment     Follow Up Recommendations  Outpatient PT;Supervision for mobility/OOB    Barriers to Discharge        Equipment Recommendations  Other (comment);None recommended by OT (rollator)    Recommendations for Other Services    Frequency Min 4X/week   Plan Discharge plan remains appropriate;Frequency remains appropriate    Precautions / Restrictions Precautions Precautions: Fall Precaution Comments: pt using visual compensatory strategies with mobility.    Pertinent Vitals/Pain     Mobility  Bed Mobility Supine to Sit: 6: Modified independent (Device/Increase time) Sit to Supine: 6: Modified independent (Device/Increase time) Transfers Sit to Stand: 5: Supervision;With upper extremity assist;With armrests;From bed Stand to Sit: 5: Supervision;With upper extremity assist;With armrests;To bed Details for Transfer Assistance: Supervision for safety.  Ambulation/Gait Ambulation/Gait Assistance: 4: Min guard Ambulation Distance (Feet): 120 Feet Assistive device: Rolling walker Ambulation/Gait Assistance Details: Patient using strategies to minimize dizziness with ambulation. Patient educated to increase cadence to tolerance Gait Pattern: Step-through pattern;Decreased stride length Gait velocity: decreased Modified Rankin (Stroke Patients Only) Pre-Morbid Rankin Score: No symptoms Modified Rankin: Moderately severe disability    Exercises     PT Diagnosis:    PT Problem List:     PT Treatment Interventions:     PT Goals Acute Rehab PT Goals PT Goal: Rolling Supine to Left Side - Progress: Progressing toward goal PT Goal: Supine/Side to Sit - Progress: Progressing toward goal PT Goal: Sit to Supine/Side - Progress: Progressing toward goal PT Goal: Sit to Stand - Progress: Progressing toward goal PT Goal: Ambulate - Progress: Progressing toward goal Additional Goals PT Goal: Additional Goal #1 - Progress: Progressing toward goal  Visit Information  Last PT Received On: 02/29/12 Assistance Needed: +1    Subjective Data      Cognition  Overall Cognitive Status: Appears within functional limits for tasks assessed/performed Arousal/Alertness: Awake/alert Orientation Level: Appears intact for tasks assessed Behavior During Session: [facility] for tasks performed    Balance     End of Session PT - End of Session Equipment Utilized During Treatment: Gait belt Activity Tolerance: Patient tolerated treatment well Patient left: in bed;with call bell/phone within reach;with bed alarm set Nurse Communication: Mobility status   GP     Fredrich Birks 02/29/2012, 2:23 PM 02/29/2012 Fredrich Birks PTA [phone] pager [phone] office

## 2012-02-29 NOTE — Progress Notes (Signed)
D/c instructions reviewed with pt and husband, education provided, stroke education discussed--see education section. Copy of all instructions given to pt. Pt and husband verbalized understanding of all instructions and pt was able to teach back. Pt d/c'd via wheelchair with belongings and medical equipment  escorted by unit NT.

## 2012-03-01 NOTE — Progress Notes (Signed)
Discussed with Wyatt Portela, PTA on 02/29/12 the fact that pt is still requesting a rollator and refusing a two-wheeled RW. Risks vs benefits have been explained to pt by me also on 8/28 (see note) and pt has decided to feels a rollator will be safer. 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   03/01/2012 Veda Canning, PT Pager: [phone]

## 2012-03-06 LAB — ALDOSTERONE + RENIN ACTIVITY W/ RATIO
Aldosterone: <1 ng/dL
PRA LC/MS/MS: 0.35 ng/mL/h (ref 0.25–5.82)

## 2012-03-07 ENCOUNTER — Other Ambulatory Visit: Payer: Medicare Other

## 2012-03-08 ENCOUNTER — Other Ambulatory Visit (INDEPENDENT_AMBULATORY_CARE_PROVIDER_SITE_OTHER): Payer: Medicare Other

## 2012-03-08 ENCOUNTER — Encounter: Payer: Self-pay | Admitting: *Deleted

## 2012-03-08 DIAGNOSIS — E876 Hypokalemia: Secondary | ICD-10-CM

## 2012-03-08 LAB — CBC
MCH: 30 pg (ref 26.0–34.0)
MCHC: 35.2 g/dL (ref 30.0–36.0)
MCV: 85.3 fL (ref 78.0–100.0)
Platelets: 378 10*3/uL (ref 150–400)
RBC: 4.9 MIL/uL (ref 3.87–5.11)
RDW: 14.2 % (ref 11.5–15.5)

## 2012-03-08 LAB — BASIC METABOLIC PANEL WITH GFR
CO2: 26 mEq/L (ref 19–32)
Calcium: 9.8 mg/dL (ref 8.4–10.5)
Creat: 0.85 mg/dL (ref 0.50–1.10)
GFR, Est African American: 80 mL/min
Potassium: 5 mEq/L (ref 3.5–5.3)
Sodium: 141 mEq/L (ref 135–145)

## 2012-03-08 NOTE — Progress Notes (Unsigned)
Pt presents for labs and states she is to have BP checked, her spouse has been keeping a BP log and i will have the copy scanned

## 2012-03-10 ENCOUNTER — Other Ambulatory Visit: Payer: Self-pay

## 2012-03-10 ENCOUNTER — Emergency Department (HOSPITAL_COMMUNITY): Payer: Medicare Other

## 2012-03-10 ENCOUNTER — Emergency Department (HOSPITAL_COMMUNITY)
Admission: EM | Admit: 2012-03-10 | Discharge: 2012-03-10 | Disposition: A | Discharge status: Home / Self Care | Payer: Medicare Other | Attending: Emergency Medicine | Admitting: Emergency Medicine

## 2012-03-10 ENCOUNTER — Encounter (HOSPITAL_COMMUNITY): Payer: Self-pay | Admitting: *Deleted

## 2012-03-10 DIAGNOSIS — I10 Essential (primary) hypertension: Secondary | ICD-10-CM

## 2012-03-10 DIAGNOSIS — R42 Dizziness and giddiness: Secondary | ICD-10-CM | POA: Insufficient documentation

## 2012-03-10 DIAGNOSIS — N390 Urinary tract infection, site not specified: Secondary | ICD-10-CM

## 2012-03-10 LAB — CBC WITH DIFFERENTIAL/PLATELET
Basophils Absolute: 0 10*3/uL (ref 0.0–0.1)
Eosinophils Absolute: 0.1 10*3/uL (ref 0.0–0.7)
Eosinophils Relative: 1 % (ref 0–5)
MCH: 30.2 pg (ref 26.0–34.0)
MCV: 86.6 fL (ref 78.0–100.0)
Platelets: 317 10*3/uL (ref 150–400)
RDW: 13.4 % (ref 11.5–15.5)
WBC: 7.5 10*3/uL (ref 4.0–10.5)

## 2012-03-10 LAB — URINALYSIS, ROUTINE W REFLEX MICROSCOPIC
Protein, ur: NEGATIVE mg/dL
Urobilinogen, UA: 0.2 mg/dL (ref 0.0–1.0)

## 2012-03-10 LAB — URINE MICROSCOPIC-ADD ON

## 2012-03-10 LAB — BASIC METABOLIC PANEL
Calcium: 10.4 mg/dL (ref 8.4–10.5)
GFR calc non Af Amer: 71 mL/min — ABNORMAL LOW (ref >90)
Sodium: 139 mEq/L (ref 135–145)

## 2012-03-10 MED ORDER — LEVOFLOXACIN 500 MG PO TABS: 500.0000 mg | ORAL_TABLET | Freq: Every day | ORAL | Status: DC

## 2012-03-10 MED ORDER — ACETAMINOPHEN 325 MG PO TABS
650.0000 mg | ORAL_TABLET | Freq: Once | ORAL | Status: AC
  Administered 2012-03-10: 650 mg via ORAL
  Filled 2012-03-10: qty 2

## 2012-03-10 MED ORDER — SULFAMETHOXAZOLE-TRIMETHOPRIM 800-160 MG PO TABS: 1.0000 | ORAL_TABLET | Freq: Two times a day (BID) | ORAL | Status: DC

## 2012-03-10 NOTE — ED Notes (Signed)
Pt. Has c/o dizziness, blurred vision, and unsteady gait.  Pt. Has a hx. Of Vertigo. Pt. Was transported due to home B/P showing over 200.  Pt. Refused Zofran by EMS due to saying "phenergan worked better for her."  Pt. Was started on lisinopril a week ago.  Pt,. Has no chest pain or deficits noted.

## 2012-03-10 NOTE — ED Provider Notes (Signed)
Medical screening examination/treatment/procedure(s) were conducted as a shared visit with non-physician practitioner(s) and myself.  I personally evaluated the patient during the encounter  Denies sudden or severe headache or new focal neuro Sxs.  Hurman Horn, MD 03/14/12 [phone]

## 2012-03-10 NOTE — ED Provider Notes (Signed)
History     CSN: [id number]  Arrival date & time 03/10/12  4098   First MD Initiated Contact with Patient 03/10/12 [phone]      Chief Complaint  Patient presents with  . Dizziness  . Hypertension  . Nausea    (Consider location/radiation/quality/duration/timing/severity/associated sxs/prior treatment) Patient is a 71 y.o. female presenting with hypertension. The history is provided by the patient.  Hypertension The current episode started today. The problem occurs constantly. Associated symptoms include chills. Pertinent negatives include no chest pain, coughing, fever, myalgias, nausea, rash or vomiting. Associated symptoms comments: The patient found her blood pressure at home to be elevated on multiple rechecks this morning. She and husband present for evaluation given recent CVA's. She denies headache, weakness, numbness, N, or V. She has been "feeling bad" since yesterday with symptoms of decreased appetite and rigors. No muscle aches, cough, dysuria or pain. She has minimal nasal congestion which she describes as clear nasal drainage. .    Past Medical History  Diagnosis Date  . Hypertension   . Vertigo     History reviewed. No pertinent past surgical history.  Family History  Problem Relation Age of Onset  . Heart attack Brother     death at age 19  . Stroke Brother     death at 87    History  Substance Use Topics  . Smoking status: Never Smoker   . Smokeless tobacco: Not on file  . Alcohol Use: Yes     occasionally    OB History    Grav Para Term Preterm Abortions TAB SAB Ect Mult Living                  Review of Systems  Constitutional: Positive for chills. Negative for fever.  HENT: Positive for rhinorrhea.   Respiratory: Negative for cough and shortness of breath.   Cardiovascular: Negative for chest pain.  Gastrointestinal: Negative for nausea and vomiting.  Genitourinary: Negative for dysuria.  Musculoskeletal: Negative for myalgias.  Skin:  Negative for rash.    Allergies  Demerol and Penicillins  Home Medications   Current Outpatient Rx  Name Route Sig Dispense Refill  . ASPIRIN 325 MG PO TABS Oral Take 1 tablet (325 mg total) by mouth daily. 30 tablet 5  . DIAZEPAM 2 MG PO TABS Oral Take 1 mg by mouth daily as needed. For dizziness.     . ESOMEPRAZOLE MAGNESIUM 20 MG PO CPDR Oral Take 1 capsule (20 mg total) by mouth daily before breakfast. 30 capsule 3  . LIDOCAINE 5 % EX PTCH Transdermal Place 1 patch onto the skin daily. Remove & Discard patch within 12 hours or as directed by MD 30 patch 0  . LISINOPRIL 5 MG PO TABS Oral Take 1 tablet (5 mg total) by mouth daily. 30 tablet 1  . MECLIZINE HCL 25 MG PO TABS Oral Take 1 tablet (25 mg total) by mouth 3 (three) times daily as needed. For vertigo. 30 tablet 2  . METFORMIN HCL 500 MG PO TABS Oral Take 1 tablet (500 mg total) by mouth daily with breakfast. 30 tablet 2  . POTASSIUM CHLORIDE ER 10 MEQ PO TBCR Oral Take 2 tablets (20 mEq total) by mouth 2 (two) times daily. 120 tablet 0  . PRAVASTATIN SODIUM 20 MG PO TABS Oral Take 1 tablet (20 mg total) by mouth daily. 30 tablet 5    BP 166/92  Pulse 86  Temp 99.3 F (37.4 C) (Oral)  Resp 16  SpO2 96%  Physical Exam  Constitutional: She is oriented to person, place, and time. She appears well-developed and well-nourished.  HENT:  Head: Normocephalic.  Neck: Normal range of motion. Neck supple.  Cardiovascular: Normal rate and regular rhythm.   No murmur heard. Pulmonary/Chest: Effort normal and breath sounds normal.  Abdominal: Soft. Bowel sounds are normal. There is no tenderness. There is no rebound and no guarding.  Musculoskeletal: Normal range of motion.  Neurological: She is alert and oriented to person, place, and time. Coordination normal.  Skin: Skin is warm and dry. No rash noted.  Psychiatric: She has a normal mood and affect.    ED Course  Procedures (including critical care time)   Labs Reviewed    CBC WITH DIFFERENTIAL  BASIC METABOLIC PANEL  URINALYSIS, ROUTINE W REFLEX MICROSCOPIC   Results for orders placed during the hospital encounter of 03/10/12  CBC WITH DIFFERENTIAL      Component Value Range   WBC 7.5  4.0 - 10.5 K/uL   RBC 4.94  3.87 - 5.11 MIL/uL   Hemoglobin 14.9  12.0 - 15.0 g/dL   HCT 21.3  08.6 - 57.8 %   MCV 86.6  78.0 - 100.0 fL   MCH 30.2  26.0 - 34.0 pg   MCHC 34.8  30.0 - 36.0 g/dL   RDW 46.9  62.9 - 52.8 %   Platelets 317  150 - 400 K/uL   Neutrophils Relative 73  43 - 77 %   Neutro Abs 5.4  1.7 - 7.7 K/uL   Lymphocytes Relative 20  12 - 46 %   Lymphs Abs 1.5  0.7 - 4.0 K/uL   Monocytes Relative 6  3 - 12 %   Monocytes Absolute 0.4  0.1 - 1.0 K/uL   Eosinophils Relative 1  0 - 5 %   Eosinophils Absolute 0.1  0.0 - 0.7 K/uL   Basophils Relative 1  0 - 1 %   Basophils Absolute 0.0  0.0 - 0.1 K/uL  BASIC METABOLIC PANEL      Component Value Range   Sodium 139  135 - 145 mEq/L   Potassium 4.6  3.5 - 5.1 mEq/L   Chloride 101  96 - 112 mEq/L   CO2 26  19 - 32 mEq/L   Glucose, Bld 113 (*) 70 - 99 mg/dL   BUN 13  6 - 23 mg/dL   Creatinine, Ser 4.13  0.50 - 1.10 mg/dL   Calcium 24.4  8.4 - 01.0 mg/dL   GFR calc non Af Amer 71 (*) >90 mL/min   GFR calc Af Amer 82 (*) >90 mL/min  URINALYSIS, ROUTINE W REFLEX MICROSCOPIC      Component Value Range   Color, Urine YELLOW  YELLOW   APPearance CLOUDY (*) CLEAR   Specific Gravity, Urine 1.007  1.005 - 1.030   pH 7.0  5.0 - 8.0   Glucose, UA NEGATIVE  NEGATIVE mg/dL   Hgb urine dipstick NEGATIVE  NEGATIVE   Bilirubin Urine NEGATIVE  NEGATIVE   Ketones, ur NEGATIVE  NEGATIVE mg/dL   Protein, ur NEGATIVE  NEGATIVE mg/dL   Urobilinogen, UA 0.2  0.0 - 1.0 mg/dL   Nitrite POSITIVE (*) NEGATIVE   Leukocytes, UA MODERATE (*) NEGATIVE  URINE MICROSCOPIC-ADD ON      Component Value Range   Squamous Epithelial / LPF FEW (*) RARE   WBC, UA 7-10  <3 WBC/hpf   Bacteria, UA MANY (*) RARE   Dg Chest 2  View  03/10/2012  *RADIOLOGY REPORT*  Clinical Data: Dizziness, hypertension, and nausea.  CHEST - 2 VIEW  Comparison: 02/25/2012  Findings: Mild cardiac enlargement is stable. Ectasia/tortuosity of the thoracic aorta is stable.  Lung volumes are slightly low. Small focal area of probable atelectasis at the lateral left lung base. Otherwise, the lungs are clear.  No acute bony abnormality.  IMPRESSION:  1.  Slight left basilar atelectasis.  Otherwise, the lungs are clear. 2.  Stable mild cardiac enlargement.   Original Report Authenticated By: Britta Mccreedy, M.D.    Mr [facility] Wo Contrast  02/25/2012  *RADIOLOGY REPORT*  Clinical Data:  Hypertension.  Sudden onset of nausea, vomiting, and dizziness.  History of vertigo.  MRI HEAD WITHOUT CONTRAST MRA HEAD WITHOUT CONTRAST  Technique:  Multiplanar, multiecho pulse sequences of the brain and surrounding structures were obtained without intravenous contrast. Angiographic images of the head were obtained using MRA technique without contrast.  Comparison:   None  MRI HEAD  Findings:   There are three small acute areas of cerebral infarction, and one subcentimeter area of subacute infarction.  The lesions which are most likely be responsible for vertigo is located in the left cerebellar white matter dorsal to the fourth ventricle (image 7 series 3, approximately 3 x 5 mm), and/or in the left paramedian midbrain (image 16 series 5, 3 x 3 mm).  The third lesion, in a different vascular territory, is seen in the right frontal periventricular white matter just superior to the caudate nucleus, approximately 3 x 4 mm cross-section.  A fourth lesion with low level restricted diffusion is seen in the left occipital subcortical white matter, likely days to a few weeks old.  There is no mass lesion or acute hemorrhage.  There is no hydrocephalus or extra-axial fluid.  Gradient echo images, sensitive to chronic hemorrhage, show numerous supratentorial and infratentorial areas of  subcentimeter chronic hemorrhage.  These are consistent with longstanding cerebrovascular disease secondary hypertension (microbleeds).  There is mild to moderate age related atrophy.  Numerous chronic lacunar infarcts are seen in the centrum semiovale.  There is extensive chronic microvascular ischemic change affecting primarily the supratentorial subcortical and periventricular white matter, also likely sequelae of hypertensive cerebrovascular disease.  No midline shift.  Normal pituitary and cerebellar tonsils. Unremarkable skull base and calvarium.  No acute sinus or mastoid disease.  IMPRESSION: At least three areas of subcentimeter acute infarction with one area of subacute infarction.  These involve widely disparate vascular territories. 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  MRA HEAD  Findings: The internal carotid arteries are widely patent.  The basilar artery shows mild nonstenotic irregularity just cephalad to the origin of the anterior inferior cerebellar artery. The right vertebral is the dominant contributor to the basilar without focal stenosis.  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  Both posterior cerebral arteries originate from the carotids.  There is no proximal stenosis of the anterior or middle cerebral arteries.  There is moderately severe narrowing of the right PCA in its ambient P2 segment.  There is no visible intracranial aneurysm or dissection. There is no visible cerebellar branch occlusion.  IMPRESSION: Intracranial atherosclerotic changes as described.  Potentially flow limiting stenosis right PCA, not accompanied by acute cerebral infarction however.   Original Report Authenticated By: Elsie Stain, M.D.    Mr Brain Wo Contrast  02/25/2012  *RADIOLOGY REPORT*   Clinical Data:  Hypertension.  Sudden onset of nausea, vomiting, and dizziness.  History of vertigo.  MRI HEAD WITHOUT CONTRAST MRA HEAD WITHOUT CONTRAST  Technique:  Multiplanar, multiecho pulse sequences of the brain and surrounding structures were obtained without intravenous contrast. Angiographic images of the head were obtained using MRA technique without contrast.  Comparison:   None  MRI HEAD  Findings:   There are three small acute areas of cerebral infarction, and one subcentimeter area of subacute infarction.  The lesions which are most likely be responsible for vertigo is located in the left cerebellar white matter dorsal to the fourth ventricle (image 7 series 3, approximately 3 x 5 mm), and/or in the left paramedian midbrain (image 16 series 5, 3 x 3 mm).  The third lesion, in a different vascular territory, is seen in the right frontal periventricular white matter just superior to the caudate nucleus, approximately 3 x 4 mm cross-section.  A fourth lesion with low level restricted diffusion is seen in the left occipital subcortical white matter, likely days to a few weeks old.  There is no mass lesion or acute hemorrhage.  There is no hydrocephalus or extra-axial fluid.  Gradient echo images, sensitive to chronic hemorrhage, show numerous supratentorial and infratentorial areas of subcentimeter chronic hemorrhage.  These are consistent with longstanding cerebrovascular disease secondary hypertension (microbleeds).  There is mild to moderate age related atrophy.  Numerous chronic lacunar infarcts are seen in the centrum semiovale.  There is extensive chronic microvascular ischemic change affecting primarily the supratentorial subcortical and periventricular white matter, also likely sequelae of hypertensive cerebrovascular disease.  No midline shift.  Normal pituitary and cerebellar tonsils. Unremarkable skull base and calvarium.  No acute sinus or mastoid disease.  IMPRESSION: At least three areas of  subcentimeter acute infarction with one area of subacute infarction.  These involve widely disparate vascular territories. 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  MRA HEAD  Findings: The internal carotid arteries are widely patent.  The basilar artery shows mild nonstenotic irregularity just cephalad to the origin of the anterior inferior cerebellar artery. The right vertebral is the dominant contributor to the basilar without focal stenosis.  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  Both posterior cerebral arteries originate from the carotids.  There is no proximal stenosis of the anterior or middle cerebral arteries.  There is moderately severe narrowing of the right PCA in its ambient P2 segment.  There is no visible intracranial aneurysm or dissection. There is no visible cerebellar branch occlusion.  IMPRESSION: Intracranial atherosclerotic changes as described.  Potentially flow limiting stenosis right PCA, not accompanied by acute cerebral infarction however.   Original Report Authenticated By: Elsie Stain, M.D.    Dg Chest Port 1 View  02/25/2012  *RADIOLOGY REPORT*  Clinical Data: CVA.  PORTABLE CHEST - 1 VIEW  Comparison: None.  Findings: The heart is borderline enlarged.  There is tortuosity and mild ectasia of the thoracic aorta.  The lungs are clear.  No pleural effusion.  The bony thorax is intact.  IMPRESSION:  1.  Mild cardiac enlargement. 2.  No acute pulmonary findings.   Original Report Authenticated By: P. Loralie Champagne, M.D.    No results found.   No diagnosis found. 1. uti 2. Hypertension    MDM  Blood pressures here are elevated  but not critical - no CP, SOB or new neurologic symptoms. Patient's symptoms are consistent with infection and she is positive for UTI.  Will treat with antibiotics and refer to her doctor at [facility] for recheck in 2-3 days.         Rodena Medin, PA-C 03/10/12 1149

## 2012-03-12 ENCOUNTER — Ambulatory Visit: Payer: Medicare Other | Admitting: Rehabilitative and Restorative Service Providers"

## 2012-03-12 ENCOUNTER — Telehealth: Payer: Self-pay | Admitting: *Deleted

## 2012-03-12 LAB — URINE CULTURE

## 2012-03-12 NOTE — Telephone Encounter (Signed)
Continue taking Levaquin as Rx. If she has fever, chills, N/V then present to clinic or the ED. Her culture/sensitivities are not back yet, it is too early to consider this failed treatment.  She has no dysuria, so giving her pyridium would not be indicated.  See her in clinic tomorrow morning.

## 2012-03-12 NOTE — Telephone Encounter (Signed)
I talked with pt and her husband and told them what Dr Kem Kays had to said. They voice understanding and will keep appointment tomorrow.  Husband will be with pt today and will call if any changes or bring to ED if she gets worse.

## 2012-03-12 NOTE — Telephone Encounter (Addendum)
Pt called stating she was seen in ED on Sunday and treated for UTI. She reports that she is still waking up a lot at night, sweating a lot and very shaky. ( She states her nerves are bad ) She denies dysuria or frequency but feels like she is not emptying her bladder.  Denies fever. She is eating and drinking but has nausea.    Sunday afternoon she started on Levaquin 500 mg 1 daily.  BP today 165/103 taken at home. She has an appointment in clinic tomorrow at 3:30  She is asking if she needs to increase her antibiotic or what else can she do for her symptoms. Pt # [phone]

## 2012-03-13 ENCOUNTER — Encounter: Payer: Self-pay | Admitting: Internal Medicine

## 2012-03-13 ENCOUNTER — Ambulatory Visit (INDEPENDENT_AMBULATORY_CARE_PROVIDER_SITE_OTHER): Payer: Medicare Other | Admitting: Internal Medicine

## 2012-03-13 VITALS — BP 175/98 | HR 86 | Temp 98.4°F | Ht 65.0 in | Wt 169.3 lb

## 2012-03-13 DIAGNOSIS — R2681 Unsteadiness on feet: Secondary | ICD-10-CM

## 2012-03-13 DIAGNOSIS — I639 Cerebral infarction, unspecified: Secondary | ICD-10-CM

## 2012-03-13 DIAGNOSIS — N390 Urinary tract infection, site not specified: Secondary | ICD-10-CM

## 2012-03-13 DIAGNOSIS — Z0000 Encounter for general adult medical examination without abnormal findings: Secondary | ICD-10-CM

## 2012-03-13 DIAGNOSIS — I10 Essential (primary) hypertension: Secondary | ICD-10-CM

## 2012-03-13 DIAGNOSIS — E876 Hypokalemia: Secondary | ICD-10-CM

## 2012-03-13 DIAGNOSIS — R42 Dizziness and giddiness: Secondary | ICD-10-CM

## 2012-03-13 DIAGNOSIS — R269 Unspecified abnormalities of gait and mobility: Secondary | ICD-10-CM

## 2012-03-13 DIAGNOSIS — I6350 Cerebral infarction due to unspecified occlusion or stenosis of unspecified cerebral artery: Secondary | ICD-10-CM

## 2012-03-13 LAB — BASIC METABOLIC PANEL WITH GFR
BUN: 10 mg/dL (ref 6–23)
Calcium: 10.2 mg/dL (ref 8.4–10.5)
Creat: 0.84 mg/dL (ref 0.50–1.10)
GFR, Est African American: 81 mL/min
Glucose, Bld: 104 mg/dL — ABNORMAL HIGH (ref 70–99)
Sodium: 141 mEq/L (ref 135–145)

## 2012-03-13 LAB — POCT URINALYSIS DIPSTICK
Bilirubin, UA: NEGATIVE
Protein, UA: NEGATIVE
Spec Grav, UA: >=1.03
Urobilinogen, UA: 0.2
pH, UA: 6

## 2012-03-13 LAB — GLUCOSE, CAPILLARY

## 2012-03-13 MED ORDER — NITROFURANTOIN MONOHYD MACRO 100 MG PO CAPS: 100.0000 mg | ORAL_CAPSULE | Freq: Two times a day (BID) | ORAL | Status: AC

## 2012-03-13 MED ORDER — SIMVASTATIN 10 MG PO TABS: 10.0000 mg | ORAL_TABLET | Freq: Every evening | ORAL | Status: DC

## 2012-03-13 MED ORDER — LISINOPRIL 20 MG PO TABS: 20.0000 mg | ORAL_TABLET | Freq: Every day | ORAL | Status: DC

## 2012-03-13 MED ORDER — POTASSIUM CHLORIDE ER 10 MEQ PO TBCR: 10.0000 meq | EXTENDED_RELEASE_TABLET | Freq: Two times a day (BID) | ORAL | Status: DC

## 2012-03-13 NOTE — Assessment & Plan Note (Addendum)
BP remains high, was on Lisinopril 5mg  PO QD upon discharge from hospital.    BP today in clinic was 175/98  -Increased Lisinopril to 20mg  PO QD -Continue checking BP at home, call back in one week of values and likely readjust medication to increase dose -counseled on heart health diet

## 2012-03-13 NOTE — ED Notes (Signed)
+   urine Chart sent to EDP office for review. 

## 2012-03-13 NOTE — Assessment & Plan Note (Signed)
Improving since admission, walks with walker  -continue PT -continue to walk with assistance as tolerated

## 2012-03-13 NOTE — Assessment & Plan Note (Signed)
Stable at this time. No return of symptoms  -continue to work on risk factor management -continue ASA -f/u neuro rehab

## 2012-03-13 NOTE — Assessment & Plan Note (Signed)
Improved since hospital discharge.  Missed neurorehab appointment due to being in the hospital for UTI  -reschedule neurorehab appointment -Continue Meclizine and Valium as needed -Continue PT and walking with walker

## 2012-03-13 NOTE — Patient Instructions (Addendum)
Please take Lisinopril 20mg  by mouth once daily  Please take your new antibiotics: Macrobid 100mg  by mouth twice daily x7 days for UTI  We changed from Pravastatin 20mg  to Simvastatin 10mg  by mouth once daily  We decreased your potassium tablets to by mouth twice a day for a total of daily instead of that you were taking before  Stop taking Levofloxacin  Stop taking Metformin  Continue taking aspirin  Follow up physical therapy  Continue monitoring your blood pressure, and call me next week in clinic to let me know how your pressure is doing.   If symptoms of UTI worsen or shakiness doesn't improve call clinic or go to ED as we discussed  You are doing much better and continue to improve, take care of yourself, we are here to help and will continue to follow you closely.

## 2012-03-13 NOTE — Progress Notes (Signed)
Subjective:   Patient ID: Jill Hancock female   DOB: 04/24/1942 71 y.o.   MRN: [id number]  HPI: JillJill Hancock is a 71 y.o. 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  Jill Hancock is emotional and teary at times in regards to her situations and fear of having another stroke.  She claims to feel "shaky" at times since discharge, anxiety and fearful in regards to having another stroke, and occasional sweating.  She wishes to have her blood pressure under control and claims to have improved vertigo and balance since discharge from the hospital.  Jill Hancock is slowly walking around the house as per her husband with the support of a walker and had to miss her neuro rehab appointment due to coming to the hospital for a UTI.  She was found to have positive UA in the ED on 03/10/12 and started on 7 day supply of Levofloxacin.  She denies any dysuria at this time, however claims to have slight left sided intermittent groin pain.  Review of urine culture from ED visit shows +ecoli urine resistant to levofloxacin.  Jill Hancock and her husband have been monitoring her BP at home with ranges of systolic 180-160 and diastolic as high as 120 at times as per their monitor.  She has discontinued her metformin and pravastatin since discharge from hospital for fear of worsening shakiness.  She denies any fever, nausea, vomiting, diarrhea, chest pain, shortness of breath, dysuria, hematuria, or abdominal pain at this time.    Past Medical History  Diagnosis Date  . Hypertension   . Vertigo    Current Outpatient Prescriptions  Medication Sig Dispense Refill  . aspirin 325 MG tablet Take 1 tablet (325 mg total) by mouth daily.  30 tablet  5  . diazepam (VALIUM) 2 MG tablet Take 1 mg by mouth daily as needed. For  dizziness.       Marland Kitchen esomeprazole (NEXIUM) 20 MG capsule Take 1 capsule (20 mg total) by mouth daily before breakfast.  30 capsule  3  . lidocaine (LIDODERM) 5 % Place 1 patch onto the skin daily. Remove & Discard patch within 12 hours or as directed by MD  30 patch  0  . meclizine (ANTIVERT) 25 MG tablet Take 1 tablet (25 mg total) by mouth 3 (three) times daily as needed. For vertigo.  30 tablet  2  . potassium chloride (K-DUR) 10 MEQ tablet Take 1 tablet (10 mEq total) by mouth 2 (two) times daily.  60 tablet  0  . DISCONTD: potassium chloride (K-DUR) 10 MEQ tablet Take 2 tablets (20 mEq total) by mouth 2 (two) times daily.  120 tablet  0  . lisinopril (PRINIVIL,ZESTRIL) 20 MG tablet Take 1 tablet (20 mg total) by mouth daily.  30 tablet  0  . nitrofurantoin, macrocrystal-monohydrate, (MACROBID) 100 MG capsule Take 1 capsule (100 mg total) by mouth 2 (two) times daily.  14 capsule  0  . simvastatin (ZOCOR) 10 MG tablet Take 1 tablet (10 mg total) by mouth every evening.  30 tablet  11   Family History  Problem Relation Age of Onset  . Heart attack Brother     death at age 73  . Stroke Brother     death at 73   History   Social History  .  Marital Status: Unknown    Spouse Name: N/A    Number of Children: N/A  . Years of Education: N/A   Social History Main Topics  . Smoking status: Never Smoker   . Smokeless tobacco: None  . Alcohol Use: Yes     occasionally  . Drug Use: No  . Sexually Active: None   Other Topics Concern  . None   Social History Narrative  . None   Review of Systems: Constitutional: Denies fever, chills, diaphoresis, appetite change and fatigue.  HEENT: Denies photophobia, eye pain, redness, hearing loss, ear pain, congestion, sore throat, rhinorrhea, sneezing, mouth sores, trouble swallowing, neck pain, neck stiffness and tinnitus.   Respiratory: Denies SOB, DOE, cough, chest tightness, and wheezing.   Cardiovascular: Denies chest pain, palpitations and  leg swelling.  Gastrointestinal: Denies nausea, vomiting, abdominal pain, diarrhea, constipation, blood in stool and abdominal distention.  Genitourinary: Being treated for UTI, felt urgency no resolved. Denies dysuria, frequency, hematuria, flank pain and difficulty urinating.  Musculoskeletal: Denies myalgias, back pain, joint swelling, arthralgias and gait problem.  Skin: Denies pallor, rash and wound.  Neurological: Improved vertigo and balance.  Hx of CVA. Denies dizziness, seizures, syncope, weakness, light-headedness, numbness and headaches.  Hematological: Denies adenopathy. Easy bruising, personal or family bleeding history  Psychiatric/Behavioral: Nervous about having another stroke.  Denies suicidal ideation, mood changes, confusion, nervousness, sleep disturbance and agitation  Objective:  Physical Exam: Filed Vitals:   03/13/12 1523  BP: 175/98  Pulse: 86  Temp: 98.4 F (36.9 C)  TempSrc: Oral  Height: 5\' 5"  (1.651 m)  Weight: 169 lb 4.8 oz (76.794 kg)  SpO2: 96%   Constitutional: Vital signs reviewed.  Patient is a well-developed and well-nourished female in no acute distress and cooperative with exam. Alert and oriented x3.  Head: Normocephalic and atraumatic Ear: TM normal bilaterally Mouth: no erythema or exudates, MMM Eyes: PERRLA, EOMI, conjunctivae normal, No scleral icterus.  Neck: Supple, Trachea midline normal ROM, No JVD, mass, thyromegaly, or carotid bruit present.  Cardiovascular: RRR, S1 normal, S2 normal, no MRG, pulses symmetric and intact bilaterally Pulmonary/Chest: CTAB, no wheezes, rales, or rhonchi Abdominal: Soft. Slightly tender to deep palpation in lower left quadrant towards groin, non-distended, bowel sounds are normal, no masses, organomegaly, or guarding present.  GU: no CVA tenderness.  Musculoskeletal: No joint deformities, erythema, or stiffness, ROM full and nontender, +2dp b/l, -edema b/l lower extremities.  Hematology: no cervical,  inginal, or axillary adenopathy.  Neurological: A&O x3, Strength improved, LUE slightly weaker than right, cranial nerve II-XII are grossly intact, sensory intact to light touch b/l. Walking with support of walker, currently in wheelchair in clinic. Improved vertigo complaints.   Skin: Warm, dry and intact. No rash, cyanosis, or clubbing.  Psychiatric: Anxiety towards having another stroke, occasionally tearful.  Speech normal. Judgment and thought content normal. Cognition and memory are normal. Cerebellar testing improved and wnl.  Gait deferred.   Assessment & Plan:

## 2012-03-13 NOTE — Assessment & Plan Note (Signed)
Came to ED on 03/10/12 for complaints of UTI, UA positive for nitrite and moderate leukocytes, started on Levofloxacin 500mg  PO QD x7 days. Urine culture positive for Ecoli resistant to Levofloxacin and sensitive to Trimeth/sulfa and Nitrofurantoin  -rechecked UA today: trace leukocytes, + nitrite -d/c Levofloxacin -start Macrobid 100mg  PO BID x7 days -f/u repeat urine culture -return to clinic in 2 weeks

## 2012-03-14 ENCOUNTER — Telehealth: Payer: Self-pay | Admitting: *Deleted

## 2012-03-14 NOTE — Progress Notes (Signed)
I saw, examined, and discussed the patient with Dr Marilu Favre and agree with the note contained here. BP not controlled on lisinopril 5. Will escalate dose and pt will call in BP's in one week. If not controlled, can increase lisinopril from 20 to 40 or change to norvasc. Stressed importance of statin.

## 2012-03-14 NOTE — Telephone Encounter (Signed)
Call made to pt's insurance company @ [phone] for prior authorization request for macrobid 100 mg twice daily for one week for UTI.  PA approved for 1 year. Pharmacy made aware.  Message left on pt's recorder that medication has been approved.Kingsley Spittle Cassady9/12/201311:38 AM

## 2012-03-15 NOTE — ED Notes (Signed)
Chart returned from EDP office. "Patient discharged home with Bactrim, which culture shows sensitivity to. Patient to follow-up with PCP in 2 days as previously directed". Reviewed by Trixie Dredge PA-C.

## 2012-03-17 LAB — URINE CULTURE

## 2012-03-19 ENCOUNTER — Ambulatory Visit: Payer: Medicare Other | Attending: Internal Medicine | Admitting: Physical Therapy

## 2012-03-19 ENCOUNTER — Telehealth: Payer: Self-pay | Admitting: *Deleted

## 2012-03-19 DIAGNOSIS — R269 Unspecified abnormalities of gait and mobility: Secondary | ICD-10-CM | POA: Insufficient documentation

## 2012-03-19 DIAGNOSIS — IMO0001 Reserved for inherently not codable concepts without codable children: Secondary | ICD-10-CM | POA: Insufficient documentation

## 2012-03-19 DIAGNOSIS — I69998 Other sequelae following unspecified cerebrovascular disease: Secondary | ICD-10-CM | POA: Insufficient documentation

## 2012-03-19 NOTE — Telephone Encounter (Signed)
Pt called in with BP readings.  There home BP machines, diastolic reading is 10 mm higher than ours. Thur:  8:00 147/96 P - 78      1:30 156/95  P - 71       8:16 pm  164/115   P - 75  Fri:     8:00  159/101  P - 80   4:00  167/89  P - 77   10:30 pm  182/110  P   - 70  Sat:   8:00  159/107  P - 72    6:00 pm  149/92  P - 82  Sun:   10:00  154/94  P - 79   8:00 pm 163/92 P - 74   Mon:  8:00 152/92  P  77    8:00 pm 182/97  P  76  Tue:  8:00  153/96  P  76

## 2012-03-20 ENCOUNTER — Telehealth: Payer: Self-pay | Admitting: Internal Medicine

## 2012-03-20 MED ORDER — LISINOPRIL 20 MG PO TABS: 40.0000 mg | ORAL_TABLET | Freq: Every day | ORAL | Status: DC

## 2012-03-20 NOTE — Telephone Encounter (Signed)
Telephone Call Addendum:  After reviewing latest BP readings they called in, we will increase Lisinopril from 20mg  daily to 40mg  daily at this time.  I called their house number and left a message for Ms. Farro and then called the husband on his number and actually spoke with him.  I relayed the information, he acknowledged the change in dosage and will relay the information to his wife.  I also changed the medication for the pharmacy as well to reflect new dose.    Mr. Hewell claims his wife has been doing better since last visit and moving around better now too.    Discussed medication change and case with Dr. Rogelia Boga.  Will continue to monitor.  Encouraged them to keep checking BP regularly and bring readings to next clinic appointment follow up visit.

## 2012-03-21 ENCOUNTER — Telehealth: Payer: Self-pay | Admitting: *Deleted

## 2012-03-21 NOTE — Telephone Encounter (Signed)
Pt calling in to say BP today was 150/80  She took  lisinopril 40 mg yesterday. C/o headache this am, took 2 Advil and now better.  Also wanted to mention the  Antibiotic,  Macrobid is  making her nauseated, she only has 2pills left.  Should she stop it early or complete the course? Pt # [id number]

## 2012-03-22 ENCOUNTER — Ambulatory Visit: Payer: Medicare Other | Admitting: Rehabilitative and Restorative Service Providers"

## 2012-03-27 ENCOUNTER — Ambulatory Visit: Payer: Medicare Other | Admitting: *Deleted

## 2012-03-27 ENCOUNTER — Other Ambulatory Visit: Payer: Self-pay | Admitting: Internal Medicine

## 2012-03-27 ENCOUNTER — Ambulatory Visit (INDEPENDENT_AMBULATORY_CARE_PROVIDER_SITE_OTHER): Payer: Medicare Other | Admitting: Internal Medicine

## 2012-03-27 ENCOUNTER — Encounter: Payer: Self-pay | Admitting: Internal Medicine

## 2012-03-27 VITALS — BP 180/103 | HR 92 | Temp 97.4°F | Ht 65.0 in | Wt 174.1 lb

## 2012-03-27 DIAGNOSIS — R2681 Unsteadiness on feet: Secondary | ICD-10-CM

## 2012-03-27 DIAGNOSIS — I639 Cerebral infarction, unspecified: Secondary | ICD-10-CM

## 2012-03-27 DIAGNOSIS — E876 Hypokalemia: Secondary | ICD-10-CM | POA: Insufficient documentation

## 2012-03-27 DIAGNOSIS — N390 Urinary tract infection, site not specified: Secondary | ICD-10-CM

## 2012-03-27 DIAGNOSIS — Z23 Encounter for immunization: Secondary | ICD-10-CM

## 2012-03-27 DIAGNOSIS — I6350 Cerebral infarction due to unspecified occlusion or stenosis of unspecified cerebral artery: Secondary | ICD-10-CM

## 2012-03-27 DIAGNOSIS — R269 Unspecified abnormalities of gait and mobility: Secondary | ICD-10-CM

## 2012-03-27 DIAGNOSIS — I10 Essential (primary) hypertension: Secondary | ICD-10-CM

## 2012-03-27 LAB — URINALYSIS, MICROSCOPIC ONLY
Casts: NONE SEEN
Crystals: NONE SEEN
RBC / HPF: NONE SEEN RBC/hpf (ref <3)

## 2012-03-27 LAB — URINALYSIS, ROUTINE W REFLEX MICROSCOPIC
Glucose, UA: NEGATIVE mg/dL
Nitrite: NEGATIVE
Protein, ur: NEGATIVE mg/dL
Specific Gravity, Urine: 1.008 (ref 1.005–1.030)
pH: 7.5 (ref 5.0–8.0)

## 2012-03-27 MED ORDER — AMLODIPINE BESYLATE 5 MG PO TABS: 5.0000 mg | ORAL_TABLET | Freq: Every day | ORAL | Status: DC

## 2012-03-27 NOTE — Patient Instructions (Addendum)
Take 40mg  Lisinopril DAILY at once and Norvasc 5mg  DAILY--okay to take all at same time  Restart Nexium  Continue physical therapy  Schedule with Dr. Elizebeth Brooking October, 2013 Phone number: [phone]  I will call you with potassium and urine results  Call me in one week with BP readings and fax results: [phone]  Stand up from seated position slowly  Flu shot given today  Return to see me in 2-3 weeks follow up

## 2012-03-27 NOTE — Progress Notes (Signed)
Subjective:   Patient ID: Jill Hancock female   DOB: 04/24/1942 71 y.o.   MRN: [id number]  HPI: Jill Hancock is a 71 y.o. white female with PMH of HTN, vertigo, and s/p CVA August 2013 presenting to clinic today for routine follow up, f/u for [facility] UTI, and BP check.  Her husband is also present in the room. Since her last visit, Jill Hancock has been seen evaluated by physical therapy who have set a plan for her rehabilitation and recommend 2x/week for 8 weeks of therapy.  She was recently increased on her Lisinopril to 40mg  daily after reviewing her weekly BP readings provided by her husband.  They claim that she has been taking 20 mg of lisinopril in the morning and 20 mg at night instead of taking them both together. Her blood pressure remains elevated, I have advised them to take 40 mg total at one time daily. She also complains of occasional lightheadedness upon standing up quickly. Orthostatic vital signs were done in clinic today lying blood pressure 182/106 a pulse of 92 feeling off balance, standing blood pressure 180/103 with pulse of 92 feeling woozy, and standing blood pressure 154/105 with a pulse of 92. She does however claims that if she stands up slowly she does not feel lightheaded. She claims she has had improvement of vertigo since hospital admission and followup visits. In regards to her urinary tract infection, she's completed her antibiotic course with Macrobid and denies any current symptoms of urgency and dysuria however claims to have occasional frequency. We will repeat urinalysis at this visit. Overall, Jill Hancock and her husband claim that she is improving daily and is feeling better. She claims that she walks around the house without her walker. She is following up with physical therapy and will followup with Dr. Pearlean Brownie in October. She does endorse occasional acid reflux especially after dinner at night. She has not been taking her Nexium and instead taking  over-the-counter antacids. I have advised her to return to the use of her Nexium which gave her relief in the past we'll continue to monitor. Otherwise she has no major complaints at this time. She denies any fever, nausea, vomiting, diarrhea, chest pain, shortness of breath, abdominal pain, or constipation at this time.   Of note, Jill Hancock and has not been taking her potassium since her last visit. We will need to recheck her BMP today and if potassium is low restart her on supplementation.   We will also need to obtain her past medical records she apparently sees Dr. Lunette Stands at [facility] in [place], [place].  Past Medical History  Diagnosis Date  . Hypertension   . Vertigo    Current Outpatient Prescriptions  Medication Sig Dispense Refill  . aspirin 325 MG tablet Take 1 tablet (325 mg total) by mouth daily.  30 tablet  5  . diazepam (VALIUM) 2 MG tablet Take 1 mg by mouth daily as needed. For dizziness.       . lidocaine (LIDODERM) 5 % Place 1 patch onto the skin daily. Remove & Discard patch within 12 hours or as directed by MD  30 patch  0  . lisinopril (PRINIVIL,ZESTRIL) 20 MG tablet Take 2 tablets (40 mg total) by mouth daily.  30 tablet  0  . meclizine (ANTIVERT) 25 MG tablet Take 1 tablet (25 mg total) by mouth 3 (three) times daily as needed. For vertigo.  30 tablet  2  . simvastatin (ZOCOR) 10 MG tablet  Take 1 tablet (10 mg total) by mouth every evening.  30 tablet  11  . amLODipine (NORVASC) 5 MG tablet Take 1 tablet (5 mg total) by mouth daily.  30 tablet  11  . esomeprazole (NEXIUM) 20 MG capsule Take 1 capsule (20 mg total) by mouth daily before breakfast.  30 capsule  3  . potassium chloride (K-DUR) 10 MEQ tablet Take 1 tablet (10 mEq total) by mouth 2 (two) times daily.  60 tablet  0   Family History  Problem Relation Age of Onset  . Heart attack Brother     death at age 58  . Stroke Brother     death at 7   History   Social History  . Marital  Status: Unknown    Spouse Name: N/A    Number of Children: N/A  . Years of Education: N/A   Social History Main Topics  . Smoking status: Never Smoker   . Smokeless tobacco: None  . Alcohol Use: Yes     occasionally  . Drug Use: No  . Sexually Active: None   Other Topics Concern  . None   Social History Narrative  . None   Review of Systems: Constitutional: Denies fever, chills, diaphoresis, appetite change and fatigue.  HEENT: Denies photophobia, eye pain, redness, hearing loss, ear pain, congestion, sore throat, rhinorrhea, sneezing, mouth sores, trouble swallowing, neck pain, neck stiffness and tinnitus.   Respiratory: Denies SOB, DOE, cough, chest tightness,  and wheezing.   Cardiovascular: Denies chest pain, palpitations and leg swelling.  Gastrointestinal: Acid reflux. Denies nausea, vomiting, abdominal pain, diarrhea, constipation, blood in stool and abdominal distention.  Genitourinary: Denies dysuria, urgency, frequency, hematuria, flank pain and difficulty urinating.  Musculoskeletal: Denies myalgias, back pain, joint swelling, arthralgias and gait problem.  Skin: Denies pallor, rash and wound.  Neurological: History of vertigo, multiple CVAs, occasional lightheadedness upon standing quickly. Denies dizziness, seizures, syncope, weakness,numbness and headaches.  Hematological: Denies adenopathy. Easy bruising, personal or family bleeding history  Psychiatric/Behavioral: Denies suicidal ideation, mood changes, confusion, nervousness, sleep disturbance and agitation  Objective:  Physical Exam: Filed Vitals:   03/27/12 1349 03/27/12 1714 03/27/12 1715  BP: 175/88 182/106 180/103  Pulse: 80 92 92  Temp: 97.4 F (36.3 C)    TempSrc: Oral    Height: 5\' 5"  (1.651 m)    Weight: 174 lb 1.6 oz (78.971 kg)    SpO2: 96%     Constitutional: Vital signs reviewed.  Patient is a well-developed and well-nourished female in no acute distress and cooperative with exam. Alert and  oriented x3.  Head: Normocephalic and atraumatic Ear: TM normal bilaterally Mouth: no erythema or exudates, MMM Eyes: PERRLA, EOMI, conjunctivae normal, No scleral icterus.  Neck: Supple, Trachea midline normal ROM, No JVD, mass, thyromegaly, or carotid bruit present.  Cardiovascular: RRR, S1 normal, S2 normal, no MRG, pulses symmetric and intact bilaterally Pulmonary/Chest: CTAB, no wheezes, rales, or rhonchi Abdominal: Soft. Non-tender, non-distended, bowel sounds are normal, no masses, organomegaly, or guarding present.  GU: no CVA tenderness Musculoskeletal: No joint deformities, erythema, or stiffness, ROM full and no nontender Hematology: no cervical, inginal, or axillary adenopathy.  Neurological: A&O x3, Strength is normal and symmetric bilaterally, cranial nerve II-XII are grossly intact, no focal motor deficit, sensory intact to light touch bilaterally. Improved cerebellar testing: Gait balanced but walks slowly, finger to nose testing improved right hand better than left Skin: Warm, dry and intact. No rash, cyanosis, or clubbing.  Psychiatric: Normal mood  and affect. speech and behavior is normal. Judgment and thought content normal. Cognition and memory are normal.   Assessment & Plan:   Discuss case with Dr. Rogelia Boga  Hypertension-signs of orthostatic hypertension as well; will continue lisinopril 40 mg daily and add Norvasc 5 mg daily. Patient's husband is to call in blood pressures in one week and may adjust medication at that time based on readings.  Urinary tract infection-symptoms resolved after completing course of antibiotics. Urinalysis negative for nitrite, trace leukocytes  Flu vaccine given today.

## 2012-03-27 NOTE — Assessment & Plan Note (Signed)
Improving. Working with physical therapy.  Claims to get around house without walker  -continue PT -continue to monitor

## 2012-03-27 NOTE — Assessment & Plan Note (Signed)
Hx of hypokalemia with unknown etiology.  Last K from 03/13/12=4.0, was told to stay on potassium daily however has not been taking her potassium daily.  Will need to recheck today and restart appropriate regimen  -f/u BMP

## 2012-03-27 NOTE — Assessment & Plan Note (Addendum)
Evaluated by PT, recommending 2x/week for 8 weeks with plan of therapeutic activities, neuromuscular re-ed, HEP, gait training, therapeutic exercise, pt/caregiver education, vestibular rehabilitation, and self care/home mgt  -f/u with Dr. Pearlean Brownie in October, 2013 -continue PT and vestibular rehab -continue aspirin -continue to bring BP to goal

## 2012-03-27 NOTE — Assessment & Plan Note (Addendum)
Symptoms improved since last visit.  Urine cx from 03/13/12 + ecoli sensitive to Nitrofurantoin--given and completed course. Currently asymptomatic.  -f/u repeat UA--negative nitrite, trace leukocytes

## 2012-03-27 NOTE — Assessment & Plan Note (Addendum)
BP in clinic today noted to be 175/88, but has not taken her lisinopril today.  She was supposed to be taking Lisinopril 40mg  daily, however, claims she has been taking 20mg  in the morning and 20mg  before bed.  BP readings at home show recordings ~155s/90s. Goal to bring her <140/90 and as close to 120/80 as possible.  Complaints of lightheadedness upon standing quickly--orthostatic vital signs done in clinic today: Lying: 182/106 HR 92, sitting blood pressure 180/103 with heart rate 92, and standing blood pressure 154/105 with heart rate 92.  Claims that if she stands up slowly the lightheadedness is not felt. We will continue to bring blood pressure to goal at this time but have cautioned her to stand up and move slowly and with caution. If symptoms worsen she is to call clinic.   -continue lisinopril 40mg  daily and add Norvasc 5mg  daily -call clinic in one week with bp readings -may need to increase norvasc to 10mg  if needed -Continue to monitor

## 2012-03-28 ENCOUNTER — Telehealth: Payer: Self-pay | Admitting: Internal Medicine

## 2012-03-28 ENCOUNTER — Ambulatory Visit: Payer: Medicare Other | Admitting: Internal Medicine

## 2012-03-28 LAB — BASIC METABOLIC PANEL WITH GFR
CO2: 29 mEq/L (ref 19–32)
Chloride: 104 mEq/L (ref 96–112)
GFR, Est Non African American: 87 mL/min
Potassium: 4.4 mEq/L (ref 3.5–5.3)
Sodium: 143 mEq/L (ref 135–145)

## 2012-03-28 NOTE — Progress Notes (Signed)
I saw, examined, and discussed the patient with Dr Marilu Favre and agree with the note contained here. Pt's BP is proving challenging to control. Agree with amlodipine addition and quick F/U.

## 2012-03-28 NOTE — Telephone Encounter (Signed)
Telephone call addendum:  Left a voicemail with Ms. Zahn this morning.  Informed her no need for continuation of potassium at this time and we will  Continue to monitor off antibiotics based on u/a and lab results.  If symptoms persist or return, asked to call or come to clinic.

## 2012-03-29 ENCOUNTER — Ambulatory Visit: Payer: Medicare Other | Admitting: *Deleted

## 2012-04-01 ENCOUNTER — Telehealth: Payer: Self-pay | Admitting: *Deleted

## 2012-04-01 ENCOUNTER — Encounter: Payer: Medicare Other | Admitting: Rehabilitative and Restorative Service Providers"

## 2012-04-01 ENCOUNTER — Other Ambulatory Visit: Payer: Self-pay | Admitting: Internal Medicine

## 2012-04-01 NOTE — Telephone Encounter (Signed)
Pt called stating she had faxed in BP readings from this past week. This AM she felt shaky and "wierd" and BP 130/80 going up to 156/92. Pt did drink OJ after shaking and felt better. She wants to know if she needs to continue on same meds. Also had a headache took advil and doing better.  She is also having muscle aching to both legs last night and today. She has been on simvastatin since 9/11 and is asking if this should be a concern.  Pt # [id number]

## 2012-04-01 NOTE — Telephone Encounter (Signed)
Telephone call addendum:  Spoke to Ms. Leyendecker, she claims to be feeling better. Her BP recordings faxed in look improved since clinic appt and closer to goal.  She states her most recent one this morning was 130s/80s.  Will not change her BP meds at this time. Her leg pain she experienced this morning has resolved. She will continue simvastatin for now, on low dose 10mg , if pain continues, told to call clinic.  She is asking for BP monitor prescription--will leave for her to pick up.  She admits to having anxiety, i counseled her to try to try to ease her anxiety as much as possible.    She is doing better and will follow up on her scheduled appointment with me in a couple of weeks.

## 2012-04-03 ENCOUNTER — Encounter: Payer: Medicare Other | Admitting: *Deleted

## 2012-04-03 ENCOUNTER — Telehealth: Payer: Self-pay | Admitting: *Deleted

## 2012-04-03 ENCOUNTER — Ambulatory Visit: Payer: Medicare Other | Attending: Internal Medicine | Admitting: Rehabilitative and Restorative Service Providers"

## 2012-04-03 DIAGNOSIS — I69998 Other sequelae following unspecified cerebrovascular disease: Secondary | ICD-10-CM | POA: Insufficient documentation

## 2012-04-03 DIAGNOSIS — R269 Unspecified abnormalities of gait and mobility: Secondary | ICD-10-CM | POA: Insufficient documentation

## 2012-04-03 DIAGNOSIS — IMO0001 Reserved for inherently not codable concepts without codable children: Secondary | ICD-10-CM | POA: Insufficient documentation

## 2012-04-03 NOTE — Telephone Encounter (Signed)
Pt calls and states she wants to know if you would like for her to continue Vit D shots that she has been giving herself? Please advise

## 2012-04-03 NOTE — Telephone Encounter (Signed)
I have asked her to bring me her old health records, as per our med rec thus far it does not say vit D. I am not sure why she is on the vit D injections or if she needs it. I would say she can probably stop them for now until I am able to see her back in the office and review records.  If she has been told of deficiency in the past, we can check levels during that visit unless she has supporting up to date records that I can review and then evaluate further.

## 2012-04-04 ENCOUNTER — Telehealth: Payer: Self-pay | Admitting: *Deleted

## 2012-04-04 NOTE — Telephone Encounter (Signed)
Pt calls and message about vit d is given. She states she would like to speak with you, her # [phone] also she would like to know   1) should she continue the vit b shots?  2) could she go to the chiropractor for accupuncture?  3) she and her husband brought her records to clinic yesterday for you to review, they should be in your mailbox  Thanks, h.

## 2012-04-04 NOTE — Telephone Encounter (Signed)
I called Jill Hancock back and spoke to her this afternoon.  She claims to be feeling well, BP under control.  Her husband has apparently dropped off her previous health records to clinic yesterday which I will review.  I told her I will look through them and evaluate her need for vit D and b12 shots.  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  The b12 shots she claims just make her feel better.  She also complains of neck pain occasionally and spoke to physical therapy about possibly seeing her chiropractor again who does acupuncture who she has had success with in the past.  I told her that if she would like for me to talk to the chiropractor before she decides to go I can do that, and she claimed that she will bring the information on her next visit and we can also evaluate further at that time.

## 2012-04-05 ENCOUNTER — Encounter: Payer: Medicare Other | Admitting: *Deleted

## 2012-04-09 ENCOUNTER — Ambulatory Visit: Payer: Medicare Other | Admitting: Rehabilitative and Restorative Service Providers"

## 2012-04-11 ENCOUNTER — Encounter: Payer: Medicare Other | Admitting: Rehabilitative and Restorative Service Providers"

## 2012-04-15 ENCOUNTER — Other Ambulatory Visit: Payer: Self-pay | Admitting: Internal Medicine

## 2012-04-16 ENCOUNTER — Telehealth: Payer: Self-pay | Admitting: *Deleted

## 2012-04-16 ENCOUNTER — Ambulatory Visit: Payer: Medicare Other | Admitting: Rehabilitative and Restorative Service Providers"

## 2012-04-16 NOTE — Telephone Encounter (Signed)
Pt called asking for a Rx for xanax 0.25 mg to use for anxiety. She has taken in past and wants something to calm her down, especially  at bedtime. She has appointment in 2 weeks with you.  Can she try this until the visit and see how it works? She wants it prn.  Pt # [id number]

## 2012-04-17 ENCOUNTER — Encounter: Payer: Self-pay | Admitting: Internal Medicine

## 2012-04-17 ENCOUNTER — Telehealth: Payer: Self-pay | Admitting: Internal Medicine

## 2012-04-17 NOTE — Telephone Encounter (Signed)
Telephone call addendum:  Spoke to Jill Hancock this evening.  She claims she gets anxious sometimes at night and shaky, especially when she has to go to PT.  She feels she has a component of depression but does not want to take antidepressants. She says she has had success with xanax in the past and is requesting low dose xanax prn. I discussed with her what she is feeling, talked to her about trying to stay calm, and that i will double check with attending prior to ordering anything and will let her know tomorrow. I encouraged her to come to clinic for her appointment with me in two weeks which, she says she plans to and we can discuss further and manage more appropriately at that time. I would like to avoid extra medications to her regimen as much as possible because she is so sensitive to medication and is s/p stroke with bp finally under control. However, if anxiety is significantly troubling and she claims she has trouble sleeping at night, will need to investigate further.

## 2012-04-17 NOTE — Telephone Encounter (Signed)
Let me think about this, i will try to call her today to confirm.

## 2012-04-18 ENCOUNTER — Encounter: Payer: Medicare Other | Admitting: Rehabilitative and Restorative Service Providers"

## 2012-04-18 NOTE — Telephone Encounter (Signed)
See Dr Waynard Reeds telephone note 04/17/12.

## 2012-04-19 ENCOUNTER — Telehealth: Payer: Self-pay | Admitting: Internal Medicine

## 2012-04-19 ENCOUNTER — Other Ambulatory Visit: Payer: Self-pay | Admitting: Internal Medicine

## 2012-04-19 MED ORDER — LORAZEPAM 0.5 MG PO TABS: ORAL_TABLET | ORAL | Status: AC

## 2012-04-19 NOTE — Telephone Encounter (Signed)
Telephone Call Encounter:  Spoke to Ms. Branan on the phone today. She continues to get anxious especially at night and is also having some trouble at home that she reports in regards to her marriage.  She reports success with xanax in the past. I dicussed with her that I reviewed with Dr. Rogelia Boga and that Ativan would be a better option as a limited trial at this point until I see her in clinic on October 29th.  She would like the smallest dose possible which I am in agreement for and will try 0.25mg  once daily PRN for 10 days (called in today) until she sees me in clinic.  We can then discuss further the cause of her anxiety and how to best treat at that point. I have again recommended counseling and hope that she will consider it strongly.  I will not give any refills at this time.

## 2012-04-24 ENCOUNTER — Encounter: Payer: Medicare Other | Admitting: Rehabilitative and Restorative Service Providers"

## 2012-04-26 ENCOUNTER — Ambulatory Visit: Payer: Medicare Other | Admitting: Rehabilitative and Restorative Service Providers"

## 2012-04-27 ENCOUNTER — Telehealth: Payer: Self-pay | Admitting: Internal Medicine

## 2012-04-27 NOTE — Telephone Encounter (Signed)
Patient called tonight and reported that she had recurrent vertigo since last night. Her symptoms including dizziness, feeding of room spinning. Patient denies any symptoms for stroke, no weakness, numbness or decreased sensations in her extremities. She does not have headache or other symptoms for new stroke. She is currently taking Meclizine. Her symptoms is getting better since it started. She wants me to leave a message to her Doctor, Dr.  Virgina Organ for calling her back tomorrow.  Patient was instructed to come to the hospital if her symptoms get worse. She verbally understood and agreed to do so.   Lorretta Harp, MD PGY2, Internal Medicine Teaching Service Pager: [phone]

## 2012-04-28 ENCOUNTER — Telehealth: Payer: Self-pay | Admitting: Internal Medicine

## 2012-04-28 NOTE — Telephone Encounter (Signed)
Telephone Call Addendum;  Ms. Pack called the resident on call pager yesterday and today and the residents lounge asking to speak to me.  She is calling in regards to ongoing vertigo.  She denies any neurological deficits, no weakness or tingling, but says the room was spinning yesterday when she woke up and today.  She says it is similar to the episodes of vertigo she used to have in the past prior to her stroke and is being maintained with Meclizine and Antivert at this time.  She denies nausea or vomiting, but feels nervous because of the vertigo.  As per our previous conversations about her anxiety, she was given low dose trial of ativan by me which she claims she has not taken; however, she did take xanax today which she claims was prescribed by me, but I clarified with her and then with the pharmacy that it was not indeed prescribed by me and instead by Elie Goody.  She also asked if she should see her ENT physician who she has seen in the past in regards to vertigo and I said that would be fine if she wanted to consult them.  I also encouraged her to speak to her neurologists office and update them on her symptoms.    She initially wanted to come to clinic Monday but only wishes to be seen by me, so she will keep to her appointment scheduled on Tuesday 04/30/12.  If her symptoms worsen or she wishes to be seen sooner, she can call the clinic or if very severe should go to the emergency room, which she understands.

## 2012-04-30 ENCOUNTER — Ambulatory Visit (INDEPENDENT_AMBULATORY_CARE_PROVIDER_SITE_OTHER): Payer: Medicare Other | Admitting: Internal Medicine

## 2012-04-30 ENCOUNTER — Encounter: Payer: Medicare Other | Admitting: Internal Medicine

## 2012-04-30 ENCOUNTER — Encounter: Payer: Self-pay | Admitting: Internal Medicine

## 2012-04-30 VITALS — BP 134/84 | HR 78 | Temp 97.2°F | Ht 65.0 in | Wt 175.6 lb

## 2012-04-30 DIAGNOSIS — I639 Cerebral infarction, unspecified: Secondary | ICD-10-CM

## 2012-04-30 DIAGNOSIS — R269 Unspecified abnormalities of gait and mobility: Secondary | ICD-10-CM

## 2012-04-30 DIAGNOSIS — F411 Generalized anxiety disorder: Secondary | ICD-10-CM

## 2012-04-30 DIAGNOSIS — R42 Dizziness and giddiness: Secondary | ICD-10-CM

## 2012-04-30 DIAGNOSIS — F419 Anxiety disorder, unspecified: Secondary | ICD-10-CM

## 2012-04-30 DIAGNOSIS — Z23 Encounter for immunization: Secondary | ICD-10-CM

## 2012-04-30 DIAGNOSIS — Z0000 Encounter for general adult medical examination without abnormal findings: Secondary | ICD-10-CM

## 2012-04-30 DIAGNOSIS — I10 Essential (primary) hypertension: Secondary | ICD-10-CM

## 2012-04-30 DIAGNOSIS — R2681 Unsteadiness on feet: Secondary | ICD-10-CM

## 2012-04-30 DIAGNOSIS — I6350 Cerebral infarction due to unspecified occlusion or stenosis of unspecified cerebral artery: Secondary | ICD-10-CM

## 2012-04-30 DIAGNOSIS — R300 Dysuria: Secondary | ICD-10-CM

## 2012-04-30 DIAGNOSIS — Z1231 Encounter for screening mammogram for malignant neoplasm of breast: Secondary | ICD-10-CM

## 2012-04-30 NOTE — Patient Instructions (Addendum)
Continue to take your blood pressure medications as prescribed  Follow up with ENT and Neurology as scheduled  I will call you with results of your tests if anything is positive  Continue physical therapy  Follow up in clinic in 3 months time  You will get tdap vaccine today  Colonoscopy and Mammogram referral made today

## 2012-05-01 ENCOUNTER — Telehealth: Payer: Self-pay | Admitting: *Deleted

## 2012-05-01 ENCOUNTER — Ambulatory Visit: Payer: Medicare Other | Admitting: Rehabilitative and Restorative Service Providers"

## 2012-05-01 LAB — URINALYSIS, MICROSCOPIC ONLY: Casts: NONE SEEN

## 2012-05-01 LAB — URINALYSIS, ROUTINE W REFLEX MICROSCOPIC
Bilirubin Urine: NEGATIVE
Glucose, UA: NEGATIVE mg/dL
Ketones, ur: NEGATIVE mg/dL
Protein, ur: NEGATIVE mg/dL
pH: 6.5 (ref 5.0–8.0)

## 2012-05-01 NOTE — Telephone Encounter (Signed)
Call from pt. Requesting a rx for "walk-in tub for safety" as requested per Neuro PT. Talked to Dr Virgina Organ- rx denied based on letter received from [facility] (04/16/12). Talked to pt again - pt states she prefer the the walk-in tub rather than a shower chair for safety; fear of falling, And willing to pay out of pocket for it. I tried calling Trula Ore; she had left for he day. I will try calling her tomorrow.

## 2012-05-02 ENCOUNTER — Encounter: Payer: Self-pay | Admitting: Internal Medicine

## 2012-05-02 ENCOUNTER — Other Ambulatory Visit: Payer: Self-pay | Admitting: Internal Medicine

## 2012-05-02 DIAGNOSIS — I639 Cerebral infarction, unspecified: Secondary | ICD-10-CM

## 2012-05-02 NOTE — Telephone Encounter (Signed)
Glenda,  Please make home health referral for the shower chair.  Let me know if there is an order in epic for it.  Please see Dr. Donnelly Stager note for comments on walk in tub.  Thank you, Dr. Jannet Mantis

## 2012-05-02 NOTE — Telephone Encounter (Signed)
Jill Hancock,  It seems i should order the shower chair with rubber tips? I am not sure which one she wants given that PT does not recommend it.  Also, why a prescription when the insurance will not cover it?

## 2012-05-02 NOTE — Telephone Encounter (Signed)
Received call from Jill Hancock. Stated she has tried to explain to the pt her insurance will not re-imbursement her for the walk-in tub. Also she can be as safe w/ a grab bar in her shower or a shower chair w/rubber tips. But pt doesn't seem to comprehend and Jill Hancock stated  Pt had discharged herself from their services.  Talked to pt this morning - stated she felled last night getting out of the shower. I explained to her what was told to me per [facility]. She agree to either the shower chair w/rubber tips or walk-in tub.  Can u write a rx and I can fax it to her #[phone] Thanks

## 2012-05-02 NOTE — Telephone Encounter (Signed)
It seems as if PT is rec shower chair w/rubber tips which seems reasonable to me and medically necessary. If you agree, referral to home health ([facility]) for a shower chair with rubber tips.   Walk in tub - seems as if everyone agree that insurance will not cover it and is not medically necessary. Therefore, you should not write an Rx for it. It patient wants to pay out of pocket, than she has every right to but paying out of pocket doesn;t require an Rx - she simply contacts the company of her choice and orders / arranges it.

## 2012-05-02 NOTE — Telephone Encounter (Signed)
Dr. Rogelia Boga,  I know we have discussed this in the past, but do you have suggestions on best thing to do at this point? Ms. Belinsky insists on walk in tub, PT does not recommend it, insurance does not cover it.  I am not sure what else we are supposed to do at this point.    Thank you,

## 2012-05-03 LAB — URINE CULTURE: Colony Count: 9000

## 2012-05-03 NOTE — Telephone Encounter (Signed)
Pt informed the referral to [facility] has been done for shower chair. We will not write a Rx for Walk in tub.  This item is not covered by insurance.  Pt may purchase on her own.

## 2012-05-04 DIAGNOSIS — F419 Anxiety disorder, unspecified: Secondary | ICD-10-CM | POA: Insufficient documentation

## 2012-05-04 NOTE — Assessment & Plan Note (Signed)
Associated with "shakes".  Called in multiple times.  Claims to do well with xanax in the past and requesting.  I explained to her that I would hold from giving any benzo's at this time and can try trial of low dose ativan.  She apparently has a prescription for xanax from a different provider at home.    -try trial of ativan instead of xanax -continue pt -will check tsh -long discussion to try to ease anxiety about repeat cva, risk factors are being monitored and on medication

## 2012-05-04 NOTE — Assessment & Plan Note (Signed)
Continues to complain of vertigo.  Better today, but past two days was very dizzy at home.  On meclizine and valium as needed which provide some relief.  Was referred to vestibular rehab s/p stroke.  Claims rehab very expensive and has not been going.  Would like to see ENT which is encouraged.  Will see them later next week.    -encouraged to continue PT -continue meclizine and valium -f/u neurology -f/u ENT

## 2012-05-04 NOTE — Assessment & Plan Note (Addendum)
Worried about having another stroke.  HTN improved closer to goal.  Feels more stable but continues to have occasional vertigo.    -f/u neurology--appt made for December 2013 -continue pt -continue risk factor management: continue norvasc and lisinopril, statin, asa

## 2012-05-04 NOTE — Assessment & Plan Note (Signed)
bp 134/84 today in clinic.  Reports systolic bp 120s at home in the morning.  Is on norvasc and lisinopril.  -continue norvasc 5mg  and lisnopril 40mg  daily

## 2012-05-04 NOTE — Progress Notes (Signed)
Quick Note:  Noted to have hb in urine, 7-10 wbc, but insignificant growth on urine cx. Please ask for her to return for repeat u/a in 2 weeks. Increase fluid intake in the meantime, if symptoms of UTI, dysuria persist, call or come to clinic. ______

## 2012-05-04 NOTE — Assessment & Plan Note (Signed)
Stable during physical exam today.  Continues to complain of vertigo at times at home.    -continue pt -f/u neurology

## 2012-05-04 NOTE — Progress Notes (Signed)
Subjective:   Patient ID: Jill Hancock female   DOB: 04/24/1942 71 y.o.   MRN: [id number]  HPI: Ms.Jemeka S Southern is a 71 y.o. white female with PMH of HTN, vertigo, and CVA presenting to clinic today for routine follow up appointment.  Ms. Gorney had called multiple times last week claiming she was worried about her vertigo.  She claims the vertigo is not new but still bothersome to her.  She continues to use meclizine and valium as needed with relief given by meclizine recently.  She ws following up with PT and vestibular rehab but stopped going to cessations due to cost.  She will follow up with neurology in December and ENT next week.  Her husband is present as well who reports she is doing much better since her hospital admission but she gets anxious and that makes her feel more unsteady.  Her BP has improved on current regimen.  She has completed her course of abx for UTI and claims she feels slight discomfort at this time. We will get repeat U/A today.  She has no other major complaints at this time.  She denies any fever, chills, N/V/D, headaches, chest pain, shortness of breath, or abdominal pain at this time.    Past Medical History  Diagnosis Date  . Hypertension   . Vertigo    Current Outpatient Prescriptions  Medication Sig Dispense Refill  . amLODipine (NORVASC) 5 MG tablet Take 1 tablet (5 mg total) by mouth daily.  30 tablet  11  . aspirin 325 MG tablet Take 1 tablet (325 mg total) by mouth daily.  30 tablet  5  . diazepam (VALIUM) 2 MG tablet Take 1 mg by mouth daily as needed. For dizziness.       Marland Kitchen esomeprazole (NEXIUM) 20 MG capsule Take 1 capsule (20 mg total) by mouth daily before breakfast.  30 capsule  3  . lisinopril (PRINIVIL,ZESTRIL) 40 MG tablet Take 1 tablet (40 mg total) by mouth daily.  30 tablet  1  . meclizine (ANTIVERT) 25 MG tablet Take 1 tablet (25 mg total) by mouth 3 (three) times daily as needed. For vertigo.  30 tablet  2  . simvastatin (ZOCOR) 10  MG tablet Take 1 tablet (10 mg total) by mouth every evening.  30 tablet  11  . potassium chloride (K-DUR) 10 MEQ tablet Take 1 tablet (10 mEq total) by mouth 2 (two) times daily.  60 tablet  0   Family History  Problem Relation Age of Onset  . Heart attack Brother     death at age 11  . Stroke Brother     death at 55   History   Social History  . Marital Status: Unknown    Spouse Name: N/A    Number of Children: N/A  . Years of Education: N/A   Social History Main Topics  . Smoking status: Never Smoker   . Smokeless tobacco: None  . Alcohol Use: Yes     occasionally  . Drug Use: No  . Sexually Active: None   Other Topics Concern  . None   Social History Narrative  . None   Review of Systems: Constitutional: Denies fever, chills, diaphoresis, appetite change and fatigue.  HEENT: Denies photophobia, eye pain, redness, hearing loss, ear pain, congestion, sore throat, rhinorrhea, sneezing, mouth sores, trouble swallowing, neck pain, neck stiffness and tinnitus.   Respiratory: Denies SOB, DOE, cough, chest tightness,  and wheezing.   Cardiovascular: Denies chest pain,  palpitations and leg swelling.  Gastrointestinal: Denies nausea, vomiting, abdominal pain, diarrhea, constipation, blood in stool and abdominal distention.  Genitourinary: slight dysuria.  Denies urgency, frequency, hematuria, flank pain and difficulty urinating.  Musculoskeletal: Denies myalgias, back pain, joint swelling, arthralgias and gait problem.  Skin: Denies pallor, rash and wound.  Neurological: hx of vertigo and CVA.  Denies seizures, syncope, weakness, light-headedness, numbness and headaches.  Hematological: Denies adenopathy. Easy bruising, personal or family bleeding history  Psychiatric/Behavioral: anxious.  Denies suicidal ideation, mood changes, confusion, sleep disturbance and agitation.   Objective:  Physical Exam: Filed Vitals:   04/30/12 1352  BP: 134/84  Pulse: 78  Temp: 97.2 F  (36.2 C)  TempSrc: Oral  Height: 5\' 5"  (1.651 m)  Weight: 175 lb 9.6 oz (79.652 kg)  SpO2: 96%   Constitutional: Vital signs reviewed.  Patient is a well-developed and well-nourished female in no acute distress and cooperative with exam. Alert and oriented x3.  Head: Normocephalic and atraumatic Ear: TM normal bilaterally Mouth: no erythema or exudates, MMM Eyes: PERRL,A EOMI, conjunctivae normal, No scleral icterus.  Neck: Supple, Trachea midline normal ROM Cardiovascular: RRR, S1 normal, S2 normal, no MRG, pulses symmetric and intact bilaterally Pulmonary/Chest: CTAB, no wheezes, rales, or rhonchi Abdominal: Soft. Non-tender, non-distended, bowel sounds are normal, no masses, organomegaly, or guarding present.  GU: no CVA tenderness Musculoskeletal: No joint deformities, erythema, or stiffness, ROM full and no nontender Hematology: no cervical, inginal, or axillary adenopathy.  Neurological: A&O x3, Strength is normal and symmetric bilaterally, cranial nerve II-XII are grossly intact, no focal motor deficit, sensory intact to light touch bilaterally. Gait normal.  Cerebellar testing: finger to nose improved, no deficits noted on dysdiadochokinesia testing, standing stable with eyes closed. Skin: Warm, dry and intact. No rash, cyanosis, or clubbing.  Psychiatric: Normal mood and affect. speech and behavior is normal. Judgment and thought content normal. Cognition and memory are normal. Endorses episodes of anxiety and shakiness at home at times.    Assessment & Plan:   Discussed with Dr. Rogelia Boga  Vertigo: f/u ent and neuro. Continue meclizine and valium as needed.  Continue PT  Anxiety: trial of ativan.  Prefers xanax, but explained to her that I prefer to do trial of ativan first instead of benzo.  She has xanax filled at home from different provided.    CVA: f/u neurology--appt in December  Health maintenance: referred for colonoscopy (requesting only virtual) and mammogram,  tetanus vaccine given today.  Claims to have gotten pneumovax by prior pcp, asked to bring records.

## 2012-05-05 NOTE — Progress Notes (Signed)
I saw, examined, and discussed the patient with Dr Marilu Favre and agree with the note contained here.

## 2012-05-06 ENCOUNTER — Telehealth: Payer: Self-pay | Admitting: *Deleted

## 2012-05-06 NOTE — Telephone Encounter (Signed)
Pt was called(results given)and instructed to increase fluid intake, repeat U/A in 2 weeks, and call for an appt if experience symptoms of UTI/dysuria persists per Dr Virgina Organ. Pt agreed.

## 2012-05-06 NOTE — Telephone Encounter (Signed)
Message copied by Hassan Buckler on Mon May 06, 2012 11:55 AM ------      Message from: Baltazar Apo      Created: Sat May 04, 2012  9:20 AM       Noted to have hb in urine, 7-10 wbc, but insignificant growth on urine cx.  Please ask for her to return for repeat u/a in 2 weeks.  Increase fluid intake in the meantime, if symptoms of UTI, dysuria persist, call or come to clinic.

## 2012-05-06 NOTE — Progress Notes (Signed)
Pt was called - no answer; message left to call the clinic. 

## 2012-05-08 ENCOUNTER — Other Ambulatory Visit: Payer: Self-pay | Admitting: Internal Medicine

## 2012-05-08 ENCOUNTER — Encounter: Payer: Self-pay | Admitting: Internal Medicine

## 2012-05-08 ENCOUNTER — Encounter: Payer: Medicare Other | Admitting: Rehabilitative and Restorative Service Providers"

## 2012-05-14 ENCOUNTER — Ambulatory Visit: Payer: Medicare Other | Admitting: Internal Medicine

## 2012-05-14 NOTE — Progress Notes (Unsigned)
Subjective Jill Hancock is a 71 year old lady with a history of multiple CVA, HTN, vertigo, and UTI that presents to the clinic for further evaluation of slight pain with urination. Her last urinalysis was found to have hemoglobin, 7-10 wbc, but insignificant growth on urine culture. She was told to return for a repeat urinalysis today.  Objective Vitals Physical  Assessment/Plan  **UTI Ms. 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

## 2012-05-16 ENCOUNTER — Other Ambulatory Visit: Payer: Self-pay | Admitting: Internal Medicine

## 2012-05-22 ENCOUNTER — Ambulatory Visit (INDEPENDENT_AMBULATORY_CARE_PROVIDER_SITE_OTHER): Payer: Medicare Other | Admitting: Internal Medicine

## 2012-05-22 VITALS — BP 132/79 | HR 74 | Temp 97.0°F | Wt 180.4 lb

## 2012-05-22 DIAGNOSIS — Z87440 Personal history of urinary (tract) infections: Secondary | ICD-10-CM

## 2012-05-22 DIAGNOSIS — R82998 Other abnormal findings in urine: Secondary | ICD-10-CM

## 2012-05-22 DIAGNOSIS — R609 Edema, unspecified: Secondary | ICD-10-CM

## 2012-05-22 DIAGNOSIS — Z0000 Encounter for general adult medical examination without abnormal findings: Secondary | ICD-10-CM

## 2012-05-22 LAB — POCT URINALYSIS DIPSTICK
Bilirubin, UA: NEGATIVE
Blood, UA: NEGATIVE
Glucose, UA: NEGATIVE
Nitrite, UA: NEGATIVE
Spec Grav, UA: 1.01
pH, UA: 7.5

## 2012-05-22 MED ORDER — ZOSTER VACCINE LIVE 19400 UNT/0.65ML ~~LOC~~ SOLR: 0.6500 mL | Freq: Once | SUBCUTANEOUS | Status: DC

## 2012-05-22 MED ORDER — NITROFURANTOIN MONOHYD MACRO 100 MG PO CAPS: 100.0000 mg | ORAL_CAPSULE | Freq: Two times a day (BID) | ORAL | Status: AC

## 2012-05-22 NOTE — Progress Notes (Signed)
Subjective:     Patient ID: Jill Hancock, female   DOB: 04/24/1942, 71 y.o.   MRN: [id number]  HPI 71 yo female with a PMH of HTN, CVA (2013), UTI (E.Coli - September 2013) and vertigo presents for a follow up visit after an abnormal UA showed 7-10 WBCs and trace Hgb about 1 month ago. She currently does not complain of any symptoms related to a UTI. Denies fever, burning on urination, or increased frequency, but does have urge incontinence. Patient does complain of lower back pain on the right and swelling of the feet for the past week. In regards to the back pain, the pain does not radiate and is isolated to the lower right back, with no decreased sensation or erythema present. The swelling of the feet has been present for about a week and she complains of about a 5 pound weight gain over the past month. She requests a "Water pill" to reduce the swelling.   Past Medical History  Diagnosis Date  . Hypertension   . Vertigo    No past surgical history on file.  Social History   .  Marital Status:  Unknown     Spouse Name:  N/A     Number of Children:  N/A   .  Years of Education:  N/A    Occupational History   .  Not on file.    Social History Main Topics   .  Smoking status:  Never Smoker   .  Smokeless tobacco:  Not on file   .  Alcohol Use:  No   .  Drug Use:  No   .  Sexually Active:     Review of Systems  Constitutional: Positive for unexpected weight change (5 lbs gain over past month). Negative for fever, chills, diaphoresis and fatigue.  HENT: Negative.   Eyes: Negative.   Respiratory: Negative.   Cardiovascular: Positive for leg swelling (bilaterally).  Gastrointestinal: Negative.   Genitourinary: Positive for urgency and flank pain (right sided, lower back). Negative for dysuria, frequency, hematuria, decreased urine volume, vaginal bleeding, vaginal discharge, enuresis and vaginal pain.  Musculoskeletal: Positive for back pain and gait problem (related to vertigo).  Negative for joint swelling and arthralgias.  Skin: Negative.   Neurological: Positive for dizziness (vertigo). Negative for tremors, weakness, light-headedness, numbness and headaches.  Hematological: Negative.   Psychiatric/Behavioral: Negative.        Objective:   Physical Exam  Constitutional: She is oriented to person, place, and time. She appears well-developed and well-nourished. No distress.  HENT:  Head: Normocephalic and atraumatic.  Right Ear: External ear normal.  Left Ear: External ear normal.  Eyes: Conjunctivae normal are normal. Pupils are equal, round, and reactive to light.  Neck: Normal range of motion. Neck supple.  Cardiovascular: Normal rate, regular rhythm and intact distal pulses.  Exam reveals no gallop and no friction rub.   No murmur heard. Pulmonary/Chest: Effort normal and breath sounds normal. No respiratory distress. She has no wheezes. She has no rales. She exhibits no tenderness.  Abdominal: Soft. Bowel sounds are normal. She exhibits no distension. There is no tenderness. There is no rebound.  Musculoskeletal: Normal range of motion. She exhibits edema (+2 b/l pitting edema). She exhibits no tenderness.  Lymphadenopathy:    She has no cervical adenopathy.  Neurological: She is alert and oriented to person, place, and time. She has normal reflexes.  Skin: Skin is warm. No rash noted. She is not diaphoretic. No erythema.  Psychiatric: She has a normal mood and affect. Her behavior is normal.   Filed Vitals:   05/22/12 0838  BP: 132/79  Pulse: 74  Temp: 97 F (36.1 C)   Urine Dipstick - 05/22/12 Color: Light yellow Glu/Bili/Ket/Blo/Pro - Negative SG: 1.010 Nitrates: Negative Leu: Small     Assessment:     71 year old female with a PMH of UTI in September 2013, presents to the clinic for a follow up visit after an abnormal UA 1 month ago which showed 7-10 WBCs and trace Hgb. - UA done today showed small amount of Leukocytes, but negative for  blood, protein, nitrates. She is asymptomatic with no complaints typically seen with a UTI. Back pain for the past week may be associated with a muscle strain vs. evidence for pyelonephritis. We gave her the option of not being treated and getting a urine culture vs. Antibiotic treatment, and she preferred being treated. As she has a history of E. Coli UTI and has associated back pain and urge incontinence, we will treat with Abx. - b/l pitting edema in lower extremity likely related to the Amlodipine she was started on about 1 month ago. Ca2+ blockers are known to cause edema in lower extremities. Other differentials include nephritic syndrome vs. CHF. No blood was recorded in the UA done on 05/22/12. 2D echo done earlier this year showed an EF of 55-60%, making CHF an unlikely cause of the swelling. Therefore, the swelling is likely 2/2 Amlodipine. We gave the patient the option of discontinuing the Amlodipine and starting another medication, but the patient preferred to stay on her current regimen as her BP is now well controlled.        Plan:     1. Macrobid 100mg  PO BID x 7 days 2. Continue BP medications as BP is well controlled with this regimen (Norvasc and Lisinopril). If the swelling continues, we will consider discontinuing Norvasc. During next visit, we will recommend compression stockings if the swelling continues.  3. Shingles vaccination prescription given to patient as she has a history of shingles and requested the vaccination. Patient will receive vaccination at her preference of Pharmacy.  4. Patient was given Metformin for an unknown reason, with an HbA1c of 5.9 on 02/26/12. Therefore we discontinued the medication from her medication list. 5. Follow up visit in 3 months if symptoms improve.

## 2012-05-22 NOTE — Progress Notes (Signed)
Subjective:     Patient ID: Jill Hancock, female   DOB: 04/24/1942, 71 y.o.   MRN: [id number]  HPI 71 year old lady coming for follow up of symptoms of UTI with UA positive for some Leucocytes but growing insignificant bacteruria. She has no symptoms of UTI today but she has some Urge incontinence which has been present for years. Also concerned about swelling in her legs around ankle since the past week. She has been started on norvace recently and that's gotten her BP under control. She has some back pain on right side - increases on touch sharp in character. May have been precipitated by activity. She has been off metformin since her A13 not diagnostic of DM.   Review of Systems  12 point review has been performed and negative except for the documentation under HPI      Objective:   Physical Exam  Constitutional: She is oriented to person, place, and time. She appears well-developed and well-nourished.  HENT:  Head: Normocephalic and atraumatic.  Eyes: Conjunctivae normal and EOM are normal. Pupils are equal, round, and reactive to light.  Neck: Normal range of motion. Neck supple.  Cardiovascular: Normal rate, regular rhythm and normal heart sounds.   Pulmonary/Chest: Effort normal and breath sounds normal.  Abdominal: Soft. Bowel sounds are normal.  Musculoskeletal: Normal range of motion.       Arms: Neurological: She is alert and oriented to person, place, and time.   Bilateral Lower extremity ankle 1 plus edema.    Assessment:   GU: given an option of repeating her Urine culture vs treating her currently asymptomatic UTI she wanted to repeat a course of Nitrofurantoin. Also the suspicion for pyelonephritis is low given her lack of symptoms except back pain. LL swelling : repeat UA to rule out any active sediment. Wants to continue Norvace at this time given good control of her BP. Swelling not significant enough to add HCTZ ( she was on that previously but d/c ed due to  inadequate control of her BP. 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 Recent Echo was not suggestive of systolic or diastolic dysfunction. Immunization: has had Zoster in the past but wishes to have zostavax vaccination (provided prescription today )     Plan:  AS in Assessment section.

## 2012-05-22 NOTE — Patient Instructions (Addendum)
Thank you for seeing [place] today at the outpatient clinic.  1. Please take Macrobid by mouth two times daily for 7 days.  2. A Shingles vaccine prescription has been written. Unfortunately it is not covered by Medicare, therefore we recommend taking the prescription to Pharmacies in your area and you can choose where to get the vaccine based on your preference.  3. Please call us if you have any increased back pain, develop a fever, burning on urination, or see blood in your urine.  4. If the swelling in your legs continues, please call us and we will reconsider continuing the Norvasc.   5. Follow up with [place] in 3 months.

## 2012-05-23 LAB — URINALYSIS, ROUTINE W REFLEX MICROSCOPIC
Nitrite: NEGATIVE
Specific Gravity, Urine: 1.009 (ref 1.005–1.030)
Urobilinogen, UA: 1 mg/dL (ref 0.0–1.0)

## 2012-05-23 LAB — URINALYSIS, MICROSCOPIC ONLY: Crystals: NONE SEEN

## 2012-05-27 ENCOUNTER — Ambulatory Visit: Payer: Self-pay

## 2012-05-28 ENCOUNTER — Telehealth: Payer: Self-pay | Admitting: *Deleted

## 2012-05-28 NOTE — Telephone Encounter (Signed)
Left message for [name], Dr. Pearlean Brownie nurse to call me and clarify which type of monitor they patient needs.

## 2012-06-06 ENCOUNTER — Telehealth: Payer: Self-pay | Admitting: *Deleted

## 2012-06-06 NOTE — Telephone Encounter (Signed)
Pt called with c/o both ankles swelling. Swelling has increased since last visit 11/20. She is on Norvasc and lisinopril.  She has gained about 4 pounds since last visit. Pt states her BP is stable.   She does not want to come in for another visit.  She stated last MD said they may d/c norvasc.  Pt # [id number] Please advise.

## 2012-06-06 NOTE — Telephone Encounter (Signed)
Pt will come in tomorrow for evaluation.

## 2012-06-06 NOTE — Telephone Encounter (Signed)
Given the worsening of her edema, I would advise that she come in for reevaluation; she may need more than just stopping the amlodipine.

## 2012-06-07 ENCOUNTER — Ambulatory Visit: Payer: Medicare Other | Admitting: Internal Medicine

## 2012-06-07 ENCOUNTER — Telehealth: Payer: Self-pay | Admitting: *Deleted

## 2012-06-07 ENCOUNTER — Ambulatory Visit (INDEPENDENT_AMBULATORY_CARE_PROVIDER_SITE_OTHER): Payer: Medicare Other | Admitting: Internal Medicine

## 2012-06-07 ENCOUNTER — Encounter: Payer: Self-pay | Admitting: Internal Medicine

## 2012-06-07 VITALS — BP 122/75 | HR 81 | Temp 97.1°F | Ht 64.0 in | Wt 180.1 lb

## 2012-06-07 DIAGNOSIS — R609 Edema, unspecified: Secondary | ICD-10-CM

## 2012-06-07 DIAGNOSIS — R600 Localized edema: Secondary | ICD-10-CM | POA: Insufficient documentation

## 2012-06-07 DIAGNOSIS — I10 Essential (primary) hypertension: Secondary | ICD-10-CM

## 2012-06-07 MED ORDER — HYDROCHLOROTHIAZIDE 12.5 MG PO TABS: 12.5000 mg | ORAL_TABLET | Freq: Every day | ORAL | Status: DC

## 2012-06-07 NOTE — Patient Instructions (Addendum)
It was nice to meet you today, Jill Hancock. Indeed, a side effect of Norvasc is edema. Since the leg edema is bothersome to you, we will hold the Norvasc for now and add Hydrochlorothiazide to your blood pressure regimen. You will therefore take lisinopril 40 mg a day and Hydrochlorothiazide 12.5 mg a day. These dosages do not come in a combination pill. If when montoring your blood pressure at home, it is still ~140/90, resume Norvasc and we will make another adjustment at your follow-up visit. Return to clinic in 1-2 weeks for a reassessment.

## 2012-06-07 NOTE — Telephone Encounter (Signed)
Return pt's call - states she will not be able to come in today at 1430PM; wants an am appt. Appt change to 1030AM; Mayer Masker nurse, made informed.

## 2012-06-07 NOTE — Telephone Encounter (Signed)
Pt. Enrolled 06/07/12 for 3 week event monitor to be mailed. TK

## 2012-06-07 NOTE — Assessment & Plan Note (Signed)
BP Readings from Last 3 Encounters:  06/07/12 122/75  05/22/12 132/79  04/30/12 134/84    Sodium  Date Value Range Status  03/27/2012 143  135 - 145 mEq/L Final     Potassium  Date Value Range Status  03/27/2012 4.4  3.5 - 5.3 mEq/L Final     Creat  Date Value Range Status  03/27/2012 0.71  0.50 - 1.10 mg/dL Final    Assessment:  Blood pressure control: controlled  Progress toward BP goal:  at goal  Comments:   Plan:  Medications:  Will hold Novasc and add HCTZ 12.5  Educational resources provided:    Self management tools provided:    Other plans: start HCTZ 12.5 qd

## 2012-06-07 NOTE — Progress Notes (Signed)
  Subjective:    Patient ID: Jill Hancock, female    DOB: Jul 08, 1940, 71 y.o.   MRN: [id number]  HPI  Reports bilateral leg swelling since starting Norvasc.  Associated with some ankle "achiness".   Review of Systems As per HPI otherwise neg    Objective:   Physical Exam  Constitutional: She is oriented to person, place, and time. She appears well-developed and well-nourished. No distress.  HENT:  Head: Normocephalic and atraumatic.  Cardiovascular: Normal rate, regular rhythm and normal heart sounds.   Pulmonary/Chest: Effort normal and breath sounds normal.  Neurological: She is alert and oriented to person, place, and time.  Skin: Skin is warm and dry.  Psychiatric: She has a normal mood and affect.          Assessment & Plan:  1. Htn: at goal today, will hold Norvasc for now and add HCTZ 12.5 mg -instructed to resume Norvasc 5 mg if bp elevate 140/90 at home on ACEi and HCTZ -f/u 1-2 week  2. LE edema: mild, likely secondary to Norvasc, no signs of CHF or DVT -hold Norvasc

## 2012-06-11 ENCOUNTER — Telehealth: Payer: Self-pay | Admitting: *Deleted

## 2012-06-11 ENCOUNTER — Other Ambulatory Visit: Payer: Self-pay | Admitting: Internal Medicine

## 2012-06-11 NOTE — Telephone Encounter (Signed)
Pt calls and states she was told not to take her norvasc but her bp is fluctuating last pm it was elevated, above 140/90 and this am it was 122/77. She states she does not want her bp to drop too low but she doesn't want to have another cva. She states the norvasc makes her legs swell. The note from last visit is reviewed and she is given the instructions verbally from that visit twice, she is offered an appt before 12/16 and she refuses. She is read the instructions again and she is told if she has problems or feels like she needs to see physician before then to please call clinic

## 2012-06-11 NOTE — Telephone Encounter (Signed)
She needs to be on her blood pressure medications.  Dr. Marge Duncans note seems clear.  If she understands what was read back to her, hopefully she will continue to take her bp medication as prescribed and BP will remain under control.  It took [place] a very long time to finally get BP under control and towards goal.  She is chronically worried about having another cva and her BP dropping "too low", however, ever since we have been following her she has never been hypotensive or even close for that matter. Risk stratification and BP goal is at utmost importance for secondary stroke prevention in her case.  I have tried to explain to her that BP values less than 140/90 and closer to 120-130 sysolic are good and what we are aiming for, yet she still seems to get worried that it "too low".  We will continue to work with her for understanding of BP and goal management.    Thank you for reading her the instructions again.  I agree, if she needs to be seen, she can call and make and appointment as has always been done in the past.

## 2012-06-12 ENCOUNTER — Encounter: Payer: Self-pay | Admitting: Internal Medicine

## 2012-06-12 DIAGNOSIS — I6350 Cerebral infarction due to unspecified occlusion or stenosis of unspecified cerebral artery: Secondary | ICD-10-CM

## 2012-06-17 ENCOUNTER — Ambulatory Visit: Payer: Medicare Other | Admitting: Internal Medicine

## 2012-06-19 ENCOUNTER — Encounter: Payer: Self-pay | Admitting: Internal Medicine

## 2012-06-19 ENCOUNTER — Ambulatory Visit (INDEPENDENT_AMBULATORY_CARE_PROVIDER_SITE_OTHER): Payer: Medicare Other | Admitting: Internal Medicine

## 2012-06-19 VITALS — BP 134/72 | HR 76 | Temp 97.0°F | Ht 64.0 in | Wt 181.8 lb

## 2012-06-19 DIAGNOSIS — I10 Essential (primary) hypertension: Secondary | ICD-10-CM

## 2012-06-19 DIAGNOSIS — I6350 Cerebral infarction due to unspecified occlusion or stenosis of unspecified cerebral artery: Secondary | ICD-10-CM

## 2012-06-19 DIAGNOSIS — R609 Edema, unspecified: Secondary | ICD-10-CM

## 2012-06-19 DIAGNOSIS — E785 Hyperlipidemia, unspecified: Secondary | ICD-10-CM

## 2012-06-19 DIAGNOSIS — R600 Localized edema: Secondary | ICD-10-CM

## 2012-06-19 DIAGNOSIS — I639 Cerebral infarction, unspecified: Secondary | ICD-10-CM

## 2012-06-19 DIAGNOSIS — E876 Hypokalemia: Secondary | ICD-10-CM

## 2012-06-19 LAB — LIPID PANEL
LDL Cholesterol: 75 mg/dL (ref 0–99)
Total CHOL/HDL Ratio: 3 Ratio
VLDL: 22 mg/dL (ref 0–40)

## 2012-06-19 LAB — BASIC METABOLIC PANEL
BUN: 12 mg/dL (ref 6–23)
Potassium: 4.4 mEq/L (ref 3.5–5.3)
Sodium: 142 mEq/L (ref 135–145)

## 2012-06-19 MED ORDER — AMLODIPINE BESYLATE 5 MG PO TABS: 5.0000 mg | ORAL_TABLET | Freq: Every day | ORAL | Status: DC

## 2012-06-19 NOTE — Assessment & Plan Note (Signed)
Trace --1+ pre-tibial pitting edema BLE (L>R), no tenderness to palpation, erythema or concern for DVT at this point

## 2012-06-19 NOTE — Assessment & Plan Note (Signed)
Cont ASA, antihypertensives, and statin. Pt requesting cholesterol check today although last panel Aug 2013 with LDL of 96.  Lipid Panel     Component Value Date/Time   CHOL 171 02/26/2012 0640   TRIG 125 02/26/2012 0640   HDL 50 02/26/2012 0640   CHOLHDL 3.4 02/26/2012 0640   VLDL 25 02/26/2012 0640   LDLCALC 96 02/26/2012 0640

## 2012-06-19 NOTE — Assessment & Plan Note (Signed)
Currently on K-Dur 10 mEq bid -check K today

## 2012-06-19 NOTE — Progress Notes (Signed)
  Subjective:    Patient ID: Jill Hancock, female    DOB: August 27, 1940, 71 y.o.   MRN: [id number]  HPI  Pt presents for f/u hypertension.  Hx significant for multiple cerebral infarction, vertigo, hypertension, hypokalemia and anxiety. The patient presented last week with complaints of lower extremity swelling since starting amlodipine. Blood pressure was near goal goal on amlodipine 5 mg and lisinopril 40 mg daily (last BP 142/90 then 122/75 on recheck). She was instructed to discontinue amlodipine, tinea lisinopril 40 mg daily and start hydrochlorothiazide 12.5 mg daily. She reports today that she was taken hydrochlorothiazide and lisinopril but became concerned when her blood pressure was still in the 130s and close to 140 systolic [place] subsequently resumed amlodipine 5 mg daily. Today her blood pressure is 134/72 pulse 76 bpm. She reports some improvement in the lower extremity swelling.   Review of Systems  Constitutional: Negative for fever and fatigue.  HENT: Negative for congestion.   Respiratory: Positive for cough. Negative for shortness of breath.        Occasional dry cough  Cardiovascular: Positive for leg swelling. Negative for chest pain and palpitations.  Skin: Negative for rash.  Neurological: Negative for weakness and headaches.  Psychiatric/Behavioral: The patient is not nervous/anxious.        Objective:   Physical Exam  Constitutional: She is oriented to person, place, and time. She appears well-developed and well-nourished. No distress.  HENT:  Head: Normocephalic and atraumatic.  Cardiovascular: Normal rate, regular rhythm, normal heart sounds and intact distal pulses.   No murmur heard. Pulmonary/Chest: Effort normal. She has no wheezes. She has no rales.  Abdominal: Soft. Bowel sounds are normal. There is no tenderness.  Musculoskeletal: Normal range of motion.       Right lower leg: She exhibits edema.       Left lower leg: She exhibits edema.       Legs:   Trace-1+ pretibial edema  Neurological: She is alert and oriented to person, place, and time.  Skin: Skin is warm and dry.  Psychiatric: She has a normal mood and affect. Her behavior is normal. Judgment and thought content normal.          Assessment & Plan:  #1 hypertension at goal: On amlodipine 5 mg, lisinopril 40 mg, and hydrochlorothiazide 12.5 mg. Patient reports taking hydrochlorothiazide intermittently. Terms of her blood pressure control this likely could be accomplished with the use of only 2 agents (amlodipine plus lisinopril or hydrochlorothiazide plus lisinopril) but patient is hesitant to increase amlodipine to 10 mg 2 to lower extremity swelling side effects and resistant to increasing hydrochlorothiazide to 25 mg because it "feel funny" -Will continue amlodipine 5 mg, lisinopril 40 mg and hydrochlorothiazide 12.5 mg daily -Followup with PCP 6 months  #2 hypokalemia: Check BMET today since on hydrochlorothiazide and potassium supplements -Continue potassium supplementation as needed

## 2012-06-19 NOTE — Patient Instructions (Addendum)
General Instructions:  Continue to take the amlodipine, lisinopril and HCTZ.  Treatment Goals:  Goals (1 Years of Data) as of 06/19/2012          As of Today 06/07/12 06/07/12 05/22/12 04/30/12     Blood Pressure    . Blood Pressure < 140/90  134/72 122/75 142/90 132/79 134/84      Progress Toward Treatment Goals:  Treatment Goal 06/19/2012  Blood pressure at goal    Self Care Goals & Plans:  Self Care Goal 06/19/2012  Manage my medications take my medicines as prescribed; bring my medications to every visit; refill my medications on time  Eat healthy foods drink diet soda or water instead of juice or soda; eat more vegetables       Care Management & Community Referrals:

## 2012-06-19 NOTE — Assessment & Plan Note (Signed)
BP Readings from Last 3 Encounters:  06/19/12 134/72  06/07/12 122/75  05/22/12 132/79    Lab Results  Component Value Date   NA 143 03/27/2012   K 4.4 03/27/2012   CREATININE 0.71 03/27/2012    Assessment:  Blood pressure control: controlled  Progress toward BP goal:  at goal  Comments:   Plan:  Medications:  continue current medications  Educational resources provided: brochure  Self management tools provided: home blood pressure logbook  Other plans: will resume amlodipine, cont hctz, and lisinopril

## 2012-07-12 ENCOUNTER — Other Ambulatory Visit: Payer: Self-pay | Admitting: Internal Medicine

## 2012-08-13 ENCOUNTER — Encounter: Payer: Medicare Other | Admitting: Internal Medicine

## 2012-08-13 ENCOUNTER — Other Ambulatory Visit: Payer: Self-pay | Admitting: Internal Medicine

## 2012-08-14 ENCOUNTER — Other Ambulatory Visit: Payer: Self-pay | Admitting: Internal Medicine

## 2012-08-14 ENCOUNTER — Telehealth: Payer: Self-pay | Admitting: *Deleted

## 2012-08-14 DIAGNOSIS — I639 Cerebral infarction, unspecified: Secondary | ICD-10-CM

## 2012-08-14 MED ORDER — PRAVASTATIN SODIUM 20 MG PO TABS: 20.0000 mg | ORAL_TABLET | Freq: Every evening | ORAL | Status: DC

## 2012-08-14 NOTE — Telephone Encounter (Signed)
Done, pravastatin 20.  Please make an appointment for her to come in to clinic first available per her schedule.  She needs to have labs drawn and be seen by a resident for her lower extremity edema and pain please.  i told her to stop taking statin in the mean time until her visit.

## 2012-08-14 NOTE — Telephone Encounter (Signed)
Message sent to front desk to schedule pt an appt. 

## 2012-08-14 NOTE — Telephone Encounter (Signed)
Call from pt - states Dr Virgina Organ was going to change statin med d/t bone pain. I will send message to MD.

## 2012-08-16 ENCOUNTER — Ambulatory Visit: Payer: Self-pay | Admitting: General Practice

## 2012-08-18 NOTE — Progress Notes (Signed)
Telephone call:  I spoke to Ms. Shutters on the phone during the afternoon of 08/13/12.  She originally had an appointment to see me that day, however, she could not make it to her appointment and needs to reschedule.    She claims her BP has been doing really well and at goal with her current regimen, however she continues to have b/l lower extremity edema and some thigh pain. She believes the lower extremity edema is from the amlodipine, but she does not want to stop it and she also is wondering if her simvastatin is the reason for her pain.  She claims her swelling and pain is no different than what she has been having chronically.  She denies any difficulty walking.    She is also asking if there is a combination pill for her lisinopril and hctz, however, unfortunately there is no combination pill with her current doses.    I discussed with Dr. Kem Kays. Plan: -hold amlodipine for now to see if any improvement in edema and if BP still at goal.  If BP increases, consider change in medications-?chlorthalidone? Vs. Lasix? -discontinue zocor and switch to pravastatin -follow up appointment as soon as possible to have labs checked, for possible statin induced side effects--check ck levels -she claims to have had recent lab work done by an outside holistic physician--will fax results of lab work to clinic.

## 2012-08-21 ENCOUNTER — Ambulatory Visit (HOSPITAL_COMMUNITY)
Admission: RE | Admit: 2012-08-21 | Discharge: 2012-08-21 | Disposition: A | Discharge status: Home / Self Care | Payer: Medicare Other | Source: Ambulatory Visit | Attending: Internal Medicine | Admitting: Internal Medicine

## 2012-08-21 ENCOUNTER — Encounter: Payer: Self-pay | Admitting: Internal Medicine

## 2012-08-21 ENCOUNTER — Ambulatory Visit (INDEPENDENT_AMBULATORY_CARE_PROVIDER_SITE_OTHER): Payer: Medicare Other | Admitting: Internal Medicine

## 2012-08-21 VITALS — BP 133/84 | HR 85 | Temp 98.2°F | Wt 182.8 lb

## 2012-08-21 DIAGNOSIS — I639 Cerebral infarction, unspecified: Secondary | ICD-10-CM

## 2012-08-21 DIAGNOSIS — M25551 Pain in right hip: Secondary | ICD-10-CM

## 2012-08-21 DIAGNOSIS — K219 Gastro-esophageal reflux disease without esophagitis: Secondary | ICD-10-CM

## 2012-08-21 DIAGNOSIS — R609 Edema, unspecified: Secondary | ICD-10-CM

## 2012-08-21 DIAGNOSIS — R600 Localized edema: Secondary | ICD-10-CM

## 2012-08-21 DIAGNOSIS — M47817 Spondylosis without myelopathy or radiculopathy, lumbosacral region: Secondary | ICD-10-CM | POA: Insufficient documentation

## 2012-08-21 DIAGNOSIS — M545 Low back pain, unspecified: Secondary | ICD-10-CM | POA: Insufficient documentation

## 2012-08-21 DIAGNOSIS — I10 Essential (primary) hypertension: Secondary | ICD-10-CM

## 2012-08-21 DIAGNOSIS — I6350 Cerebral infarction due to unspecified occlusion or stenosis of unspecified cerebral artery: Secondary | ICD-10-CM

## 2012-08-21 DIAGNOSIS — E785 Hyperlipidemia, unspecified: Secondary | ICD-10-CM

## 2012-08-21 DIAGNOSIS — Z13820 Encounter for screening for osteoporosis: Secondary | ICD-10-CM

## 2012-08-21 DIAGNOSIS — M899 Disorder of bone, unspecified: Secondary | ICD-10-CM | POA: Insufficient documentation

## 2012-08-21 LAB — BASIC METABOLIC PANEL WITH GFR
BUN: 14 mg/dL (ref 6–23)
CO2: 26 mEq/L (ref 19–32)
Calcium: 9.5 mg/dL (ref 8.4–10.5)
GFR, Est African American: 77 mL/min
Glucose, Bld: 118 mg/dL — ABNORMAL HIGH (ref 70–99)

## 2012-08-21 LAB — POCT URINALYSIS DIPSTICK
Blood, UA: NEGATIVE
Nitrite, UA: NEGATIVE
Protein, UA: NEGATIVE
Urobilinogen, UA: 0.2
pH, UA: 5.5

## 2012-08-21 MED ORDER — OMEPRAZOLE 20 MG PO CPDR: 20.0000 mg | DELAYED_RELEASE_CAPSULE | Freq: Every day | ORAL | Status: DC

## 2012-08-21 MED ORDER — FUROSEMIDE 20 MG PO TABS: 20.0000 mg | ORAL_TABLET | Freq: Every day | ORAL | Status: DC | PRN

## 2012-08-21 NOTE — Patient Instructions (Addendum)
General Instructions: -Take Lasix everyday for your leg swelling -Stop taking Hydrochrolothiazide -You will need Xrays, the orders are sent already -Follow up next week for a Lab visit to check your potassium level while you take Lasix -It was great meeting you today!   Treatment Goals:  Goals (1 Years of Data) as of 08/21/12         As of Today 06/19/12 06/07/12 06/07/12 05/22/12     Blood Pressure    . Blood Pressure < 140/90  143/81 134/72 122/75 142/90 132/79      Progress Toward Treatment Goals:  Treatment Goal 08/21/2012  Blood pressure at goal    Self Care Goals & Plans:  Self Care Goal 08/21/2012  Manage my medications take my medicines as prescribed; refill my medications on time  Monitor my health keep track of my blood pressure  Eat healthy foods eat foods that are low in salt       Care Management & Community Referrals:  Referral 08/21/2012  Referrals made for care management support none needed

## 2012-08-21 NOTE — Progress Notes (Signed)
  Subjective:    Patient ID: Jill Hancock, female    DOB: Aug 31, 1940, 72 y.o.   MRN: [id number]  HPI Jill Hancock is a 72 year old woman with PMH of HTN, multiple cerebral infarctions, bilateral LE edema, and anxiety who comes in for follow up visit for her leg edema and evaluation of right hip pain. She reports persistent lower extremity edema, especially around her ankles that is worse at night. She also has had right hip pain for months now for which she is currently seeing a Chiropractor with CAM treatments including accupuncture, cold laser treatment that have relieved some of her pain. She denies loss of bowel or bladder control, paresthesias, numbness, tingling, or weakness of her right leg.    Review of Systems  Constitutional: Negative for fever, chills, diaphoresis, activity change, appetite change, fatigue and unexpected weight change.  Respiratory: Negative for cough and shortness of breath.   Cardiovascular: Positive for leg swelling. Negative for chest pain.  Gastrointestinal: Negative for abdominal pain.  Genitourinary: Negative for dysuria, frequency and difficulty urinating.  Musculoskeletal: Positive for back pain and arthralgias.  Skin: Negative for color change, pallor, rash and wound.  Neurological: Negative for dizziness, tremors, weakness, numbness and headaches.  Psychiatric/Behavioral: Negative for behavioral problems and agitation.       Objective:   Physical Exam  Nursing note and vitals reviewed. Constitutional: She is oriented to person, place, and time. No distress.  Obese  Neck: No JVD present.  Cardiovascular: Normal rate.   Pulmonary/Chest: Effort normal. No respiratory distress.  Abdominal: Soft. There is no tenderness.  Musculoskeletal: Normal range of motion. She exhibits edema. She exhibits no tenderness.  1+ pitting edema bilaterally, negative straight leg raise, normal hip flexion/extension. No lumbar paraspinal tenderness.   Neurological: She  is alert and oriented to person, place, and time. She has normal reflexes.  Skin: Skin is warm and dry. No rash noted. She is not diaphoretic. No erythema. No pallor.  Psychiatric: She has a normal mood and affect.          Assessment & Plan:

## 2012-08-22 ENCOUNTER — Telehealth: Payer: Self-pay | Admitting: *Deleted

## 2012-08-22 DIAGNOSIS — M25551 Pain in right hip: Secondary | ICD-10-CM

## 2012-08-22 LAB — URINALYSIS, ROUTINE W REFLEX MICROSCOPIC
Bilirubin Urine: NEGATIVE
Glucose, UA: NEGATIVE mg/dL
Specific Gravity, Urine: 1.015 (ref 1.005–1.030)

## 2012-08-22 NOTE — Telephone Encounter (Signed)
Call from pt - requesting Voltaren Gel for leg pain. Saw Dr Garald Braver yesterday. Uses [facility].

## 2012-08-23 ENCOUNTER — Ambulatory Visit (HOSPITAL_COMMUNITY)
Admission: RE | Admit: 2012-08-23 | Discharge: 2012-08-23 | Disposition: A | Discharge status: Home / Self Care | Payer: Medicare Other | Source: Ambulatory Visit | Attending: Internal Medicine | Admitting: Internal Medicine

## 2012-08-23 DIAGNOSIS — Z13820 Encounter for screening for osteoporosis: Secondary | ICD-10-CM | POA: Insufficient documentation

## 2012-08-23 DIAGNOSIS — Z780 Asymptomatic menopausal state: Secondary | ICD-10-CM | POA: Insufficient documentation

## 2012-08-23 LAB — URINE CULTURE
Colony Count: NO GROWTH
Organism ID, Bacteria: NO GROWTH

## 2012-08-23 MED ORDER — DICLOFENAC SODIUM 1 % TD GEL: 2.0000 g | Freq: Four times a day (QID) | TRANSDERMAL | Status: DC

## 2012-08-23 NOTE — Assessment & Plan Note (Signed)
No recent weakness, speech changes. She is on Pravachol with recent LDL of 75.  She denies muscle cramps or pain that could be 2/2 statin use.  -CK check today

## 2012-08-23 NOTE — Assessment & Plan Note (Addendum)
Onset is unclear, she denies trauma or precipitating event, the pain has been present for weeks. She has some relief of the pain with CAM (Accupuncture, Cold Laser tx).  -Lumbar Xray -Hip/Pelvis X-ray for S&I joint eval -Sed rate

## 2012-08-23 NOTE — Assessment & Plan Note (Signed)
BP well controlled today but given pitting edema, will stop HCTZ and start Lasix instead with BMET for K trending next week.

## 2012-08-23 NOTE — Assessment & Plan Note (Signed)
She denies dysuria, urinary retention, or increased urinary frequency. She reports having asymptomatic UTI before.  Urine dipstick with trace leuck, neg for nitrites -UA with reflex microscopy -urine cx

## 2012-08-23 NOTE — Assessment & Plan Note (Signed)
Ordered DEXA scan

## 2012-08-29 ENCOUNTER — Encounter: Payer: Medicare Other | Admitting: Internal Medicine

## 2012-09-05 ENCOUNTER — Other Ambulatory Visit: Payer: Self-pay | Admitting: Internal Medicine

## 2012-09-05 ENCOUNTER — Encounter: Payer: Self-pay | Admitting: Internal Medicine

## 2012-09-05 DIAGNOSIS — M810 Age-related osteoporosis without current pathological fracture: Secondary | ICD-10-CM

## 2012-09-09 ENCOUNTER — Other Ambulatory Visit: Payer: Self-pay | Admitting: Internal Medicine

## 2012-09-09 ENCOUNTER — Telehealth: Payer: Self-pay | Admitting: Internal Medicine

## 2012-09-09 NOTE — Telephone Encounter (Signed)
Discussed results of dexa scan confirming osteoporosis.  She said she was aware and has historically been getting vitamin d injections twice a month by her holistic doctor.  She has not however had one for a while.  She is also asking about a possible one time a year shot that she has heard they may have for treatment of osteoporosis.  I discussed with her likely bisphosphonate therapy but that I will review more literature and come up with a suitable treatment plan for her.    In the meantime, her leg cramping has improved since on pravastatin, ck was mildly elevated on last visit and her lower extremity edema has also improved on lasix.  She needs to have repeat bmet to follow up on potassium since her visit.    Thus, we will have her come in later this week or early next week with an appointment with me where we will get repeat bmet, check vitamin d, check bp and go over treatment plan for osteoporosis.    She is in agreement and requests appointments in early AM.  Otherwise she claims to be feeling fine and doing well.

## 2012-09-12 ENCOUNTER — Other Ambulatory Visit: Payer: Self-pay | Admitting: Family Medicine

## 2012-09-13 ENCOUNTER — Telehealth: Payer: Self-pay | Admitting: *Deleted

## 2012-09-13 ENCOUNTER — Ambulatory Visit: Payer: Medicare Other | Admitting: Internal Medicine

## 2012-09-13 DIAGNOSIS — Z0000 Encounter for general adult medical examination without abnormal findings: Secondary | ICD-10-CM

## 2012-09-13 NOTE — Telephone Encounter (Signed)
Pt called this am requesting CT Virtual Colonoscopy to be scheduled.  It needs to be pre-certed but this a discrepancy w/her date of birth. Pt was told she needed to call her insurance co and have DOB corrected.  I called and left pt a message to see if this had been done. Pt does have an appt on Monday - I will talk to pt about this if she does not call back.

## 2012-09-16 ENCOUNTER — Ambulatory Visit: Payer: Medicare Other | Admitting: Internal Medicine

## 2012-09-17 ENCOUNTER — Encounter: Payer: Self-pay | Admitting: Internal Medicine

## 2012-09-17 ENCOUNTER — Ambulatory Visit (INDEPENDENT_AMBULATORY_CARE_PROVIDER_SITE_OTHER): Payer: Medicare Other | Admitting: Internal Medicine

## 2012-09-17 ENCOUNTER — Ambulatory Visit: Payer: Medicare Other | Admitting: Internal Medicine

## 2012-09-17 VITALS — BP 128/81 | HR 86 | Temp 97.6°F | Ht 64.0 in | Wt 181.2 lb

## 2012-09-17 DIAGNOSIS — E559 Vitamin D deficiency, unspecified: Secondary | ICD-10-CM

## 2012-09-17 MED ORDER — ALENDRONATE SODIUM 70 MG PO TABS: 70.0000 mg | ORAL_TABLET | ORAL | Status: DC

## 2012-09-17 MED ORDER — CALCIUM CARBONATE-VITAMIN D 600-400 MG-UNIT PO CHEW: 2.0000 | CHEWABLE_TABLET | Freq: Every day | ORAL | Status: DC

## 2012-09-17 NOTE — Assessment & Plan Note (Signed)
On lasix prn  -f/u cmet to monitor k

## 2012-09-17 NOTE — Assessment & Plan Note (Signed)
Well controlled.    -valium and meclizine prn

## 2012-09-17 NOTE — Assessment & Plan Note (Addendum)
Discussed treatment options in detail today.  Will proceed with starting bisphosphonate therapy and recheck dexa scan in 2 years.  Also discussed need for at last 1200mg  calcium daily and vitamin d 800IU daily.  She claims she is unable to tolerate po oscal in the past and thus gets vitamin d injections by her holistic doctor twice a week.  She was just refills for injection of D3 with lidocaine 50,000 twice weekly and she has not started the injections yet.  She is agreeable to chewable calcium and vitamin d supplements to see if those may help.    Fracture Risk Chart ~moderate risk Patient's 10-year risk of any fracture is 14%. Patient's 10-year risk of hip fracture is 3%.              -start alendronate 70mg  once a week--explained side effects and need to take with water and stay upright for at least 30 minutes -chewable calcium and vitamin d 600-400mg  IU 2 tablets daily -repeat dexa scan 2 years -continue chiropractor and physical therapy -f/u vitamin d level today, previous level in 09/2011: 20

## 2012-09-17 NOTE — Assessment & Plan Note (Signed)
Trace edema, much improved per patient especially with lasix.  Non-tender to palpation.  No erythema noted.   -continue lasix 20mg  prn

## 2012-09-17 NOTE — Assessment & Plan Note (Signed)
Lumbar spine xray 08/2012: Spondylosis, without acute finding. Osteopenia. dexa scan with osteoporosis on femur neck t score <=-2.5  Much improved, goes to see chiropractor prn as well -starting alendronate therapy and calcium and vitamin d

## 2012-09-17 NOTE — Progress Notes (Signed)
Subjective:   Patient ID: Jill Hancock female   DOB: [birth year] 72 y.o.   MRN: [id number]  HPI: [name] is a 72 y.o. 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  Today, we have discussed in detail her recent DEXA scan from 08/23/12 that showed osteoporosis -2.5 tscore on left femoral neck and osteopenia -1.1 t score of AP spine.  She also has noted vitamin d deficiency in the past for which she receives vitamin d injections bi-weekly by her holistic doctor in the past.    Since her stroke, she has been doing very well, symptoms of unsteadiness and vertigo have almost completely resolved.  She reports also recently seeing Dr. Pearlean Brownie and he was happy with her progress and risk factor modification.  Her BP is very well controlled now and she remains on her aspirin and statin.    In regards to her new diagnosis of osteoporosis, I have discussed her results in detail and offered treatment options including bisphosphonate therapy at this time.  In regards to her lifestyle modifications she has done almost everything, she is a non-smoker and tries to exercise daily and is compliant with her medications.  She is worried about having a fracture in the future and would like to be proactive about treatment.  Thus, we will proceed with starting alendronate weekly today and also supplement with calcium and vitamin d daily.  She reports prior intolerance to oscal tablets but is willing to try chewables.    She was recently started on 5 day course of z-pack by her other physician for sinusitis and is on day 1 of treatment.  She plans to get her shingles and pneumococcal vaccine when she feels better. We will also refer her for colonoscopy today--although she insists on virtual colonoscopy only.     Past Medical History  Diagnosis Date  . Hypertension   . Vertigo   . Stroke 02/2012    MRI revealed at least 3  subcentimeter acute infarctions with one area of subacute infarction in widely disparate vascular territories including territory of left cerebellum and around right caudate nucleus along with widespread lacunar infarcts, chronic microvascular ischemic change, and numerous microbleeds suggesting long standing hypertensive cerebrovascular disease.  MRA confirmed intracranial   Current Outpatient Prescriptions  Medication Sig Dispense Refill  . amLODipine (NORVASC) 5 MG tablet Take 1 tablet (5 mg total) by mouth daily.  30 tablet  11  . aspirin 325 MG tablet Take 1 tablet (325 mg total) by mouth daily.  30 tablet  5  . diazepam (VALIUM) 2 MG tablet Take 1 mg by mouth daily as needed. For dizziness.       . furosemide (LASIX) 20 MG tablet Take 1 tablet (20 mg total) by mouth daily as needed (for leg swelling).  30 tablet  11  . lisinopril (PRINIVIL,ZESTRIL) 40 MG tablet TAKE 1 TABLET BY MOUTH DAILY  30 tablet  5  . meclizine (ANTIVERT) 25 MG tablet TAKE 1 TABLET BY MOUTH THREE TIMES A DAY AS NEEDED FOR VERTIGO  30 tablet  2  . omeprazole (PRILOSEC) 20 MG capsule Take 1 capsule (20 mg total) by mouth daily.  30 capsule  1  . potassium chloride (K-DUR) 10 MEQ tablet Take 1 tablet (10 mEq total) by mouth 2 (two) times daily.  60 tablet  0  . pravastatin (PRAVACHOL) 20 MG tablet Take 1 tablet (20 mg total) by  mouth every evening.  30 tablet  11  . zoster vaccine live, PF, (ZOSTAVAX) 62130 UNT/0.65ML injection Inject 19,400 Units into the skin once.  1 each  0  . alendronate (FOSAMAX) 70 MG tablet Take 1 tablet (70 mg total) by mouth every 7 (seven) days. Take with full glass of water on empty stomach  12 tablet  5  . Calcium Carbonate-Vitamin D 600-400 MG-UNIT per chew tablet Chew 2 tablets by mouth daily.  60 tablet  5  . diclofenac sodium (VOLTAREN) 1 % GEL Apply 2 g topically 4 (four) times daily.  1 Tube  1   No current facility-administered medications for this visit.   Family History  Problem  Relation Age of Onset  . Heart attack Brother     death at age 75  . Stroke Brother     death at 32   History   Social History  . Marital Status: Unknown    Spouse Name: N/A    Number of Children: N/A  . Years of Education: N/A   Social History Main Topics  . Smoking status: Never Smoker   . Smokeless tobacco: None  . Alcohol Use: 0.6 oz/week    1 Glasses of wine per week     Comment: occasionally  . Drug Use: No  . Sexually Active: Yes   Other Topics Concern  . None   Social History Narrative  . None   Review of Systems: Constitutional: Denies fever, chills, diaphoresis, appetite change and fatigue.  HEENT: Denies photophobia, eye pain, redness, hearing loss, ear pain, congestion, sore throat, rhinorrhea, sneezing, mouth sores, trouble swallowing, neck pain, neck stiffness and tinnitus.   Respiratory: Denies SOB, DOE, cough, chest tightness,  and wheezing.   Cardiovascular: Denies chest pain, palpitations and leg swelling.  Gastrointestinal: Denies nausea, vomiting, abdominal pain, diarrhea, constipation, blood in stool and abdominal distention.  Genitourinary: Denies dysuria, urgency, frequency, hematuria, flank pain and difficulty urinating.  Musculoskeletal: mild R hip pain.  Denies myalgias, back pain, joint swelling, and gait problem.  Skin: Denies pallor, rash and wound.  Neurological: occasional vertigo.  Denies dizziness, seizures, syncope, weakness, light-headedness, numbness and headaches.  Hematological: Denies adenopathy. Easy bruising, personal or family bleeding history  Psychiatric/Behavioral: Denies suicidal ideation, mood changes, confusion, nervousness, sleep disturbance and agitation  Objective:  Physical Exam: Filed Vitals:   09/17/12 1453  BP: 128/81  Pulse: 86  Temp: 97.6 F (36.4 C)  TempSrc: Oral  Height: 5\' 4"  (1.626 m)  Weight: 181 lb 3.2 oz (82.192 kg)  SpO2: 95%   Constitutional: Vital signs reviewed.  Patient is a well-developed and  well-nourished female in no acute distress and cooperative with exam. Alert and oriented x3.  Head: Normocephalic and atraumatic Ear: TM normal bilaterally Mouth: no erythema or exudates, MMM Eyes: PERRL, EOMI, conjunctivae normal, No scleral icterus.  Neck: Supple, Trachea midline normal ROM Cardiovascular: RRR, S1 normal, S2 normal, no MRG, pulses symmetric and intact bilaterally Pulmonary/Chest: CTAB, no wheezes, rales, or rhonchi Abdominal: Soft. Non-tender, non-distended, bowel sounds are normal, no masses, organomegaly, or guarding present.  GU: no CVA tenderness Musculoskeletal: No joint deformities, erythema, or stiffness, ROM full and non-tender.  Trace lower extremity b/l edema, non-pitting.   Hematology: no cervical, inginal, or axillary adenopathy.  Neurological: A&O x3, Strength is normal and symmetric bilaterally, cranial nerve II-XII are grossly intact, no focal motor deficit, sensory intact to light touch bilaterally.  Skin: Warm, dry and intact. No rash, cyanosis, or clubbing.  Psychiatric:  Normal mood and affect. speech and behavior is normal. Judgment and thought content normal. Cognition and memory are normal.   Assessment & Plan:  Discussed with Dr. Eben Burow Osteoporosis: start alendronate weekly, dexa scan repeat 2  Years.  F/u vitamin d and recommend calcium and vitamin d supplement F/u cmet--on lasix prn

## 2012-09-17 NOTE — Patient Instructions (Signed)
Please start taking Alendronate 70mg  1 tablet every week for osteoporosis  Please also start taking calcium-vitamin d tablets daily.  Calcium should equal 1200mg  daily and vitamin d should be at least 800 IU daily  Please continue your other medications as prescribed  Please send me the name of your vitamin d injections  We will recheck your labs today and inform you of the results  Please return for nurse visit when feeling better for pneumococcal vaccine  Please get your shingles vaccine when able  We will refer you for colonoscopy today  Thank you for coming in today, continue to take care of yourself  Alendronate weekly tablets What is this medicine? ALENDRONATE (a LEN droe nate) slows calcium loss from bones. It helps to make healthy bone and to slow bone loss in people with osteoporosis. It may be used to treat Paget's disease. This medicine may be used for other purposes; ask your health care provider or pharmacist if you have questions. What should I tell my health care provider before I take this medicine? They need to know if you have any of these conditions: -esophagus, stomach, or intestine problems, like acid-reflux or GERD -dental disease -kidney disease -low blood calcium -low vitamin D -problems swallowing -problems sitting or standing for 30 minutes -an unusual or allergic reaction to alendronate, other medicines, foods, dyes, or preservatives -pregnant or trying to get pregnant -breast-feeding How should I use this medicine? You must take this medicine exactly as directed or you will lower the amount of medicine you absorb into your body or you may cause yourself harm. Take your dose by mouth first thing in the morning, after you are up for the day. Do not eat or drink anything before you take this medicine. Swallow your medicine with a full glass (6 to 8 fluid ounces) of plain water. Do not take this tablet with any with any other drink. Do not chew or crush the  tablet. After taking this medicine, do not eat breakfast, drink, or take any medicines or vitamins for at least 30 minutes. Stand or sit up for at least 30 minutes after you take this medicine; do not lie down. Take this medicine on the same day every week. Do not take your medicine more often than directed. Talk to your pediatrician regarding the use of this medicine in children. Special care may be needed. Overdosage: If you think you have taken too much of this medicine contact a poison control center or emergency room at once. NOTE: This medicine is only for you. Do not share this medicine with others. What if I miss a dose? If you miss a dose, take the dose on the morning after you remember. Then take your next dose on your regular day of the week. Never take 2 tablets on the same day. Do not take double or extra doses. What may interact with this medicine? -aluminum hydroxide -antacids -aspirin -calcium supplements -drugs for inflammation like ibuprofen, naproxen, and others -iron supplements -magnesium supplements -vitamins with minerals This list may not describe all possible interactions. Give your health care provider a list of all the medicines, herbs, non-prescription drugs, or dietary supplements you use. Also tell them if you smoke, drink alcohol, or use illegal drugs. Some items may interact with your medicine. What should I watch for while using this medicine? Visit your doctor or health care professional for regular checks ups. It may be some time before you see benefit from this medicine. Do not stop  taking your medication except on your doctor's advice. Your doctor or health care professional may order blood tests and other tests to see how you are doing. You should make sure you get enough calcium and vitamin D while you are taking this medicine, unless your doctor tells you not to. Discuss the foods you eat and the vitamins you take with your health care professional. Some  people who take this medicine have severe bone, joint, and/or muscle pain. This medicine may also increase your risk for a broken thigh bone. Tell your doctor right away if you have pain in your upper leg or groin. Tell your doctor if you have any pain that does not go away or that gets worse. This medicine can make you more sensitive to the sun. If you get a rash while taking this medicine, sunlight may cause the rash to get worse. Keep out of the sun. If you cannot avoid being in the sun, wear protective clothing and use sunscreen. Do not use sun lamps or tanning beds/booths. What side effects may I notice from receiving this medicine? Side effects that you should report to your doctor or health care professional as soon as possible: -allergic reactions like skin rash, itching or hives, swelling of the face, lips, or tongue -black or tarry stools -bone, muscle or joint pain -changes in vision -chest pain -heartburn or stomach pain -jaw pain, especially after dental work -pain or trouble when swallowing -redness, blistering, peeling or loosening of the skin, including inside the mouth Side effects that usually do not require medical attention (report to your doctor or health care professional if they continue or are bothersome): -changes in taste -diarrhea or constipation -eye pain or itching -headache -nausea or vomiting -stomach gas or fullness This list may not describe all possible side effects. Call your doctor for medical advice about side effects. You may report side effects to FDA at 1-800-FDA-1088. Where should I keep my medicine? Keep out of the reach of children. Store at room temperature of 15 and 30 degrees C (59 and 86 degrees F). Throw away any unused medicine after the expiration date. NOTE: This sheet is a summary. It may not cover all possible information. If you have questions about this medicine, talk to your doctor, pharmacist, or health care provider.  2012,  [employer]vier/Gold Standard. (12/16/2010 8:56:46 AM)  Osteoporosis Throughout your life, your body breaks down old bone and replaces it with new bone. As you get older, your body does not replace bone as quickly as it breaks it down. By the age of 30 years, most people begin to gradually lose bone because of the imbalance between bone loss and replacement. Some people lose more bone than others. Bone loss beyond a specified normal degree is considered osteoporosis.  Osteoporosis affects the strength and durability of your bones. The inside of the ends of your bones and your flat bones, like the bones of your pelvis, look like honeycomb, filled with tiny open spaces. As bone loss occurs, your bones become less dense. This means that the open spaces inside your bones become bigger and the walls between these spaces become thinner. This makes your bones weaker. Bones of a person with osteoporosis can become so weak that they can break (fracture) during minor accidents, such as a simple fall. CAUSES  The following factors have been associated with the development of osteoporosis:  Smoking.  Drinking more than 2 alcoholic drinks several days per week.  Long-term use of certain medicines:  Corticosteroids.  Chemotherapy medicines.  Thyroid medicines.  Antiepileptic medicines.  Gonadal hormone suppression medicine.  Immunosuppression medicine.  Being underweight.  Lack of physical activity.  Lack of exposure to the sun. This can lead to vitamin D deficiency.  Certain medical conditions:  Certain inflammatory bowel diseases, such as Crohn's disease and ulcerative colitis.  Diabetes.  Hyperthyroidism.  Hyperparathyroidism. RISK FACTORS Anyone can develop osteoporosis. However, the following factors can increase your risk of developing osteoporosis:  Gender Women are at higher risk than men.  Age Being older than 50 years increases your risk.  Ethnicity White and Asian people have an  increased risk.  Weight Being extremely underweight can increase your risk of osteoporosis.  Family history of osteoporosis Having a family member who has developed osteoporosis can increase your risk. SYMPTOMS  Usually, people with osteoporosis have no symptoms.  DIAGNOSIS  Signs during a physical exam that may prompt your caregiver to suspect osteoporosis include:  Decreased height. This is usually caused by the compression of the bones that form your spine (vertebrae) because they have weakened and become fractured.  A curving or rounding of the upper back (kyphosis). To confirm signs of osteoporosis, your caregiver may request a procedure that uses 2 low-dose X-ray beams with different levels of energy to measure your bone mineral density (dual-energy X-ray absorptiometry [DXA]). Also, your caregiver may check your level of vitamin D. TREATMENT  The goal of osteoporosis treatment is to strengthen bones in order to decrease the risk of bone fractures. There are different types of medicines available to help achieve this goal. Some of these medicines work by slowing the processes of bone loss. Some medicines work by increasing bone density. Treatment also involves making sure that your levels of calcium and vitamin D are adequate. PREVENTION  There are things you can do to help prevent osteoporosis. Adequate intake of calcium and vitamin D can help you achieve optimal bone mineral density. Regular exercise can also help, especially resistance and high-impact activities. If you smoke, quitting smoking is an important part of osteoporosis prevention. MAKE SURE YOU:  Understand these instructions.  Will watch your condition.  Will get help right away if you are not doing well or get worse. Document Released: 03/29/2005 Document Revised: 09/11/2011 Document Reviewed: 06/03/2011 [facility] Patient Information 2013 [place], [place].

## 2012-09-17 NOTE — Assessment & Plan Note (Addendum)
BP Readings from Last 3 Encounters:  09/17/12 128/81  08/21/12 133/84  06/19/12 134/72   Lab Results  Component Value Date   NA 142 08/21/2012   K 4.0 08/21/2012   CREATININE 0.87 08/21/2012   Assessment:  Blood pressure control: controlled  Progress toward BP goal:  at goal  Plan:  Medications:  continue current medications lasix 20mg  daily prn, lisinopril 40mg  and norvasc 5mg  daily  Educational resources provided: brochure  Self management tools provided:    Other plans: continue home monitoring -follow up cmet today to monitor K with lasix

## 2012-09-17 NOTE — Assessment & Plan Note (Signed)
Doing great s/p cva 02/2012.  Followed up with Dr. Pearlean Brownie recently and doing well with good control on risk factor modifications.    -will continue HTN control and statin.  CK was checked last visit although did not appear indicated as usually not necessary to be checked while on statin, especially if asymptomatic.  CK noted to be mildly elevated 186 and trending down from 2013.   -continue pravastatin -continue to work on diet modifications, less fatty and greasy food -exercise

## 2012-09-18 ENCOUNTER — Telehealth: Payer: Self-pay | Admitting: *Deleted

## 2012-09-18 LAB — COMPLETE METABOLIC PANEL WITH GFR
ALT: 32 U/L (ref 0–35)
AST: 32 U/L (ref 0–37)
Albumin: 4.1 g/dL (ref 3.5–5.2)
Calcium: 9.5 mg/dL (ref 8.4–10.5)
Chloride: 104 mEq/L (ref 96–112)
Potassium: 4 mEq/L (ref 3.5–5.3)

## 2012-09-18 NOTE — Telephone Encounter (Signed)
Received a message from RN that Jill Hancock was asking about Prolia.   I called her back and left a message. I do not think Prolia/denosumab is a favorable choice for first line treatment for Jill Hancock and I recommend proceeding with bisphosphonate therapy with alendronate first at this time.  She has no hx of fractures, has not failed other therapies since she has not started any therapy yet, and per frax scores: 14% major osteoporosis and 3.9% hip fracture risk.  If for whatever reason she proves intolerance to bisphosphonate therapy, we may consider this option down the road.  I have called her and left a voicemail with this information.  She is welcome to call back anytime.    Per up to date:   There are currently no head-to-head trials comparing the antifracture efficacy of denosumab with other available osteoporosis therapies (eg, oral and intravenous bisphosphonates, teriparatide). The reduction in vertebral fracture noted with denosumab is similar to the reductions reported for subcutaneous teriparatide and intravenous zoledronic acid, and greater than that reported for oral alendronate. However, these data are based upon clinical trials in different patient populations, not head-to-head comparison trials.  Given the absence of long-term safety data and the availability of other agents, denosumab is not recommended for osteoporosis prevention. The [place] Food and Drug Administration (FDA) and the European Medicines Agency approved denosumab for the treatment of postmenopausal women with osteoporosis at high risk for fracture (history of osteoporotic fracture, multiple risk factors for fracture) or patients who have failed or are intolerant of other available osteoporosis therapies [12,21].  In the absence of definitive data comparing osteoporosis therapies, treatment decisions should be individualized. The individual's risk for fracture, presence of comorbid conditions, and personal preference are  important for weighing the potential benefits and risks of osteoporosis therapies.  There is no consensus on the optimal use of denosumab. Some experts believe that denosumab should not be used as initial therapy for postmenopausal women with osteoporosis at high risk for fracture because of the availability of oral bisphosphonates, for which there are long-term safety and fracture prevention data. In addition, generic alendronate is less expensive. However, other experts disagree and would use denosumab as initial therapy for women at high risk for fracture who have difficulty with the dosing requirements of oral bisphosphonates or are unwilling to take bisphosphonates. In addition, denosumab may have a role in patients who are intolerant of or unresponsive to other therapies and in those with impaired renal function. (See 'Use in chronic kidney disease' below.)

## 2012-09-18 NOTE — Telephone Encounter (Signed)
Called her back and left a message. Prolia/denosumab is not choice for first line treatment and I recommend proceeding with bisphosphonate therapy with alendronate first at this time.  She has no hx of fractures, has not failed other therapies since she has not started any therapy yet, and per frax scores: 14% major osteoporosis and 3.9% hip fracture risk.  If for whatever reason she proves intolerance to bisphosphonate therapy, we may consider this option down the road.  I have called her and left a voicemail with this information.  She is welcome to call back anytime.    Per up to date:   There are currently no head-to-head trials comparing the antifracture efficacy of denosumab with other available osteoporosis therapies (eg, oral and intravenous bisphosphonates, teriparatide). The reduction in vertebral fracture noted with denosumab is similar to the reductions reported for subcutaneous teriparatide and intravenous zoledronic acid, and greater than that reported for oral alendronate. However, these data are based upon clinical trials in different patient populations, not head-to-head comparison trials.  Given the absence of long-term safety data and the availability of other agents, denosumab is not recommended for osteoporosis prevention. The [place] Food and Drug Administration (FDA) and the European Medicines Agency approved denosumab for the treatment of postmenopausal women with osteoporosis at high risk for fracture (history of osteoporotic fracture, multiple risk factors for fracture) or patients who have failed or are intolerant of other available osteoporosis therapies [12,21].  In the absence of definitive data comparing osteoporosis therapies, treatment decisions should be individualized. The individual's risk for fracture, presence of comorbid conditions, and personal preference are important for weighing the potential benefits and risks of osteoporosis therapies.  There is no consensus on the  optimal use of denosumab. Some experts believe that denosumab should not be used as initial therapy for postmenopausal women with osteoporosis at high risk for fracture because of the availability of oral bisphosphonates, for which there are long-term safety and fracture prevention data. In addition, generic alendronate is less expensive. However, other experts disagree and would use denosumab as initial therapy for women at high risk for fracture who have difficulty with the dosing requirements of oral bisphosphonates or are unwilling to take bisphosphonates. In addition, denosumab may have a role in patients who are intolerant of or unresponsive to other therapies and in those with impaired renal function. (See 'Use in chronic kidney disease' below.)

## 2012-09-18 NOTE — Telephone Encounter (Signed)
Message from pt - she wants to know what do u think about Prolia injections for osteoporosis? Thanks

## 2012-09-19 ENCOUNTER — Telehealth: Payer: Self-pay | Admitting: *Deleted

## 2012-09-19 NOTE — Telephone Encounter (Signed)
Message from pt - c/o stomach pain; "hurt all night" - wants to know is it from the aspirin. Thanks

## 2012-09-19 NOTE — Telephone Encounter (Signed)
Called back and left a voicemail.  No answer.

## 2012-09-24 ENCOUNTER — Telehealth: Payer: Self-pay | Admitting: *Deleted

## 2012-09-24 DIAGNOSIS — Z1211 Encounter for screening for malignant neoplasm of colon: Secondary | ICD-10-CM

## 2012-09-24 NOTE — Telephone Encounter (Signed)
Send to PCP.

## 2012-09-24 NOTE — Addendum Note (Signed)
Addended by: Blanch Media A on: 09/24/2012 03:59 PM   Modules accepted: Orders

## 2012-09-24 NOTE — Telephone Encounter (Signed)
PCP is on vacation.

## 2012-09-24 NOTE — Telephone Encounter (Signed)
Jill Hancock received call from [facility] - CT Virtual Colonoscopy order needs to be changed CT Colon Diagnostic. Thanks

## 2012-09-24 NOTE — Telephone Encounter (Signed)
I looked at the note and the referral and it appears that Dr Q intended for the referral to be to GI for colonoscopy and not virtual colonoscopy. There is a GI referral in [facility]

## 2012-09-24 NOTE — Telephone Encounter (Signed)
Pt prefers virtual colonoscopy (Dr Virgina Organ awared) - [facility] needs new order for CT Colon Diagnostic. Thanks

## 2012-09-25 ENCOUNTER — Other Ambulatory Visit (HOSPITAL_COMMUNITY): Payer: Self-pay | Admitting: Family Medicine

## 2012-09-25 MED ORDER — ZOLEDRONIC ACID 5 MG/100ML IV SOLN: 5.0000 mg | Freq: Once | INTRAVENOUS | Status: DC

## 2012-09-25 NOTE — Telephone Encounter (Signed)
[facility] will call pt to schedule an appt.

## 2012-09-25 NOTE — Telephone Encounter (Signed)
Pt insists on virtual colonoscopy instead of regular colonoscopy.  I explained to her that this may not be insurance approved as she should technically be able to tolerate a regular colonoscopy but she still wanted to pursue virtual.

## 2012-09-26 NOTE — Addendum Note (Signed)
Addended by: Hassan Buckler on: 09/26/2012 02:15 PM   Modules accepted: Orders

## 2012-09-30 ENCOUNTER — Ambulatory Visit (HOSPITAL_COMMUNITY): Payer: Medicare Other

## 2012-09-30 ENCOUNTER — Other Ambulatory Visit: Payer: Self-pay | Admitting: Internal Medicine

## 2012-11-11 ENCOUNTER — Telehealth: Payer: Self-pay | Admitting: *Deleted

## 2012-11-11 NOTE — Telephone Encounter (Signed)
Call from from pt - c/o waking up this morning being "shakey; also c/o nausea and stomach pain. Stated she might labs done. Informed her Dr Virgina Organ has no appts today; not until next Tuesday. Suggested to see someone else; stated she would prefer to see her doctor. She wants me to send message to Dr Jannet Mantis and see what she suggests.

## 2012-11-11 NOTE — Telephone Encounter (Signed)
She should be seen in the clinic by another provider if possible.  If the pain is severe, she should go to the emergency room

## 2012-11-11 NOTE — Telephone Encounter (Signed)
Pt states the pain isn't severe or she would had gone to the ED. Pt still does not want to see anyone else except for Dr Virgina Organ - appt schedule for next Tues 5/20 at 3:15PM; instructed pt to go to Urgent Care if needed or even the ED. She agreed.

## 2012-11-12 ENCOUNTER — Telehealth: Payer: Self-pay | Admitting: Internal Medicine

## 2012-11-12 NOTE — Telephone Encounter (Signed)
I called Jill Hancock in regards to her recent call to triage.  No answer, left a voicemail for her to call back.  Unfortunately, all my appointment slots for next week are full and thus I recommend her to see another provider in the meantime if needed or else will need to see me another week.

## 2012-11-12 NOTE — Telephone Encounter (Signed)
315pm appointment already full for that day, cannot fit anyone else in until next week.

## 2012-11-12 NOTE — Telephone Encounter (Signed)
Appt for next Tueasday @ 3:15PM confirmed w/pt.

## 2012-11-12 NOTE — Telephone Encounter (Signed)
Called patient, no answer, left voicemail

## 2012-11-19 ENCOUNTER — Ambulatory Visit (INDEPENDENT_AMBULATORY_CARE_PROVIDER_SITE_OTHER): Payer: Medicare Other | Admitting: Internal Medicine

## 2012-11-19 ENCOUNTER — Encounter: Payer: Self-pay | Admitting: Internal Medicine

## 2012-11-19 VITALS — BP 139/80 | HR 76 | Temp 96.7°F | Wt 181.2 lb

## 2012-11-19 DIAGNOSIS — M810 Age-related osteoporosis without current pathological fracture: Secondary | ICD-10-CM

## 2012-11-19 DIAGNOSIS — R12 Heartburn: Secondary | ICD-10-CM

## 2012-11-19 DIAGNOSIS — R600 Localized edema: Secondary | ICD-10-CM

## 2012-11-19 DIAGNOSIS — Z87440 Personal history of urinary (tract) infections: Secondary | ICD-10-CM

## 2012-11-19 DIAGNOSIS — R609 Edema, unspecified: Secondary | ICD-10-CM

## 2012-11-19 DIAGNOSIS — I10 Essential (primary) hypertension: Secondary | ICD-10-CM

## 2012-11-19 DIAGNOSIS — F329 Major depressive disorder, single episode, unspecified: Secondary | ICD-10-CM

## 2012-11-19 DIAGNOSIS — Z23 Encounter for immunization: Secondary | ICD-10-CM

## 2012-11-19 LAB — POCT URINALYSIS DIPSTICK
Bilirubin, UA: NEGATIVE
Glucose, UA: NEGATIVE
Ketones, UA: NEGATIVE
Nitrite, UA: NEGATIVE
Protein, UA: NEGATIVE
Spec Grav, UA: <=1.005
Urobilinogen, UA: 0.2
pH, UA: 6

## 2012-11-19 MED ORDER — FLUOXETINE HCL 10 MG PO CAPS: 10.0000 mg | ORAL_CAPSULE | Freq: Every day | ORAL | Status: DC

## 2012-11-19 MED ORDER — NITROFURANTOIN MONOHYD MACRO 100 MG PO CAPS: 100.0000 mg | ORAL_CAPSULE | Freq: Two times a day (BID) | ORAL | Status: DC

## 2012-11-19 NOTE — Patient Instructions (Addendum)
General Instructions: Please take macrobid for your UTI x7 days; be sure to complete the whole course of the antibiotics  Please follow up with neurology, Dr. Pearlean Brownie office about your Aspirin dose  We will start low dose prozac today 10mg  and slowly titrate you up   Please restart your lasix daily and potassium supplements  Please start taking your alendronate for osteoporosis  Continue your other medications as prescribed  I will see you back in 1 month  Continue nexium  Elevate your legs when resting  Treatment Goals:  Goals (1 Years of Data) as of 11/19/12         As of Today 09/17/12 08/21/12 08/21/12 06/19/12     Blood Pressure    . Blood Pressure < 140/90  139/80 128/81 133/84 143/81 134/72      Progress Toward Treatment Goals:  Treatment Goal 11/19/2012  Blood pressure at goal    Self Care Goals & Plans:  Self Care Goal 11/19/2012  Manage my medications take my medicines as prescribed; bring my medications to every visit  Monitor my health -  Eat healthy foods eat foods that are low in salt; eat baked foods instead of fried foods; eat more vegetables  Meeting treatment goals maintain the current self-care plan    Care Management & Community Referrals:  Referral 11/19/2012  Referrals made for care management support none needed  Referrals made to community resources none     Fluoxetine capsules or tablets (Depression/Mood Disorders) What is this medicine? FLUOXETINE (floo OX e teen) belongs to a class of drugs known as selective serotonin reuptake inhibitors (SSRIs). It helps to treat mood problems such as depression, obsessive compulsive disorder, and panic attacks. It can also treat certain eating disorders. This medicine may be used for other purposes; ask your health care provider or pharmacist if you have questions. What should I tell my health care provider before I take this medicine? They need to know if you have any of these conditions: -bipolar  disorder or mania -diabetes -glaucoma -liver disease -psychosis -seizures -suicidal thoughts or history of attempted suicide -an unusual or allergic reaction to fluoxetine, other medicines, foods, dyes, or preservatives -pregnant or trying to get pregnant -breast-feeding How should I use this medicine? Take this medicine by mouth with a glass of water. Follow the directions on the prescription label. You can take this medicine with or without food. Take your medicine at regular intervals. Do not take it more often than directed. Do not stop taking this medicine suddenly except upon the advice of your doctor. Stopping this medicine too quickly may cause serious side effects or your condition may worsen. A special MedGuide will be given to you by the pharmacist with each prescription and refill. Be sure to read this information carefully each time. Talk to your pediatrician regarding the use of this medicine in children. While this drug may be prescribed for children as young as 7 years for selected conditions, precautions do apply. Overdosage: If you think you have taken too much of this medicine contact a poison control center or emergency room at once. NOTE: This medicine is only for you. Do not share this medicine with others. What if I miss a dose? If you miss a dose, skip the missed dose and go back to your regular dosing schedule. Do not take double or extra doses. What may interact with this medicine? Do not take fluoxetine with any of the following medications: -other medicines containing fluoxetine, like Sarafem or  Symbyax -cisapride -linezolid -MAOIs like Carbex, Eldepryl, Marplan, Nardil, and Parnate -methylene blue (injected into a vein) -pimozide -thioridazine This medicine may also interact with the following medications: -alcohol -aspirin and aspirin-like medicines -carbamazepine -certain medicines for depression, anxiety, or psychotic disturbances -certain medicines for  migraine headaches like almotriptan, eletriptan, frovatriptan, naratriptan, rizatriptan, sumatriptan, zolmitriptan -digoxin -diuretics -fentanyl -flecainide -furazolidone -isoniazid -lithium -medicines for sleep -medicines that treat or prevent blood clots like warfarin, enoxaparin, and dalteparin -NSAIDs, medicines for pain and inflammation, like ibuprofen or naproxen -phenytoin -procarbazine -propafenone -rasagiline -ritonavir -supplements like St. John's wort, kava kava, valerian -tramadol -tryptophan -vinblastine This list may not describe all possible interactions. Give your health care provider a list of all the medicines, herbs, non-prescription drugs, or dietary supplements you use. Also tell them if you smoke, drink alcohol, or use illegal drugs. Some items may interact with your medicine. What should I watch for while using this medicine? Tell your doctor if your symptoms do not get better or if they get worse. Visit your doctor or health care professional for regular checks on your progress. Because it may take several weeks to see the full effects of this medicine, it is important to continue your treatment as prescribed by your doctor. Patients and their families should watch out for new or worsening thoughts of suicide or depression. Also watch out for sudden changes in feelings such as feeling anxious, agitated, panicky, irritable, hostile, aggressive, impulsive, severely restless, overly excited and hyperactive, or not being able to sleep. If this happens, especially at the beginning of treatment or after a change in dose, call your health care professional. Bonita Quin may get drowsy or dizzy. Do not drive, use machinery, or do anything that needs mental alertness until you know how this medicine affects you. Do not stand or sit up quickly, especially if you are an older patient. This reduces the risk of dizzy or fainting spells. Alcohol may interfere with the effect of this  medicine. Avoid alcoholic drinks. Your mouth may get dry. Chewing sugarless gum or sucking hard candy, and drinking plenty of water may help. Contact your doctor if the problem does not go away or is severe. This medicine may affect blood sugar levels. If you have diabetes, check with your doctor or health care professional before you change your diet or the dose of your diabetic medicine. What side effects may I notice from receiving this medicine? Side effects that you should report to your doctor or health care professional as soon as possible: -allergic reactions like skin rash, itching or hives, swelling of the face, lips, or tongue -breathing problems -confusion -fast or irregular heart rate, palpitations -flu-like fever, chills, cough, muscle or joint aches and pains -seizures -suicidal thoughts or other mood changes -tremors -trouble sleeping -unusual bleeding or bruising -unusually tired or weak -vomiting Side effects that usually do not require medical attention (report to your doctor or health care professional if they continue or are bothersome): -blurred vision -change in sex drive or performance -diarrhea -dry mouth -flushing -headache -increased or decreased appetite -nausea -sweating This list may not describe all possible side effects. Call your doctor for medical advice about side effects. You may report side effects to FDA at 1-800-FDA-1088. Where should I keep my medicine? Keep out of the reach of children. Store at room temperature between 15 and 30 degrees C (59 and 86 degrees F). Throw away any unused medicine after the expiration date. NOTE: This sheet is a summary. It may not cover  all possible information. If you have questions about this medicine, talk to your doctor, pharmacist, or health care provider.  2013, [employer]. (11/03/2011 7:31:49 PM)  Alendronate weekly tablets What is this medicine? ALENDRONATE (a LEN droe nate) slows calcium  loss from bones. It helps to make healthy bone and to slow bone loss in people with osteoporosis. It may be used to treat Paget's disease. This medicine may be used for other purposes; ask your health care provider or pharmacist if you have questions. What should I tell my health care provider before I take this medicine? They need to know if you have any of these conditions: -esophagus, stomach, or intestine problems, like acid-reflux or GERD -dental disease -kidney disease -low blood calcium -low vitamin D -problems swallowing -problems sitting or standing for 30 minutes -an unusual or allergic reaction to alendronate, other medicines, foods, dyes, or preservatives -pregnant or trying to get pregnant -breast-feeding How should I use this medicine? You must take this medicine exactly as directed or you will lower the amount of medicine you absorb into your body or you may cause yourself harm. Take your dose by mouth first thing in the morning, after you are up for the day. Do not eat or drink anything before you take this medicine. Swallow your medicine with a full glass (6 to 8 fluid ounces) of plain water. Do not take this tablet with any with any other drink. Do not chew or crush the tablet. After taking this medicine, do not eat breakfast, drink, or take any medicines or vitamins for at least 30 minutes. Stand or sit up for at least 30 minutes after you take this medicine; do not lie down. Take this medicine on the same day every week. Do not take your medicine more often than directed. Talk to your pediatrician regarding the use of this medicine in children. Special care may be needed. Overdosage: If you think you have taken too much of this medicine contact a poison control center or emergency room at once. NOTE: This medicine is only for you. Do not share this medicine with others. What if I miss a dose? If you miss a dose, take the dose on the morning after you remember. Then take your  next dose on your regular day of the week. Never take 2 tablets on the same day. Do not take double or extra doses. What may interact with this medicine? -aluminum hydroxide -antacids -aspirin -calcium supplements -drugs for inflammation like ibuprofen, naproxen, and others -iron supplements -magnesium supplements -vitamins with minerals This list may not describe all possible interactions. Give your health care provider a list of all the medicines, herbs, non-prescription drugs, or dietary supplements you use. Also tell them if you smoke, drink alcohol, or use illegal drugs. Some items may interact with your medicine. What should I watch for while using this medicine? Visit your doctor or health care professional for regular checks ups. It may be some time before you see benefit from this medicine. Do not stop taking your medication except on your doctor's advice. Your doctor or health care professional may order blood tests and other tests to see how you are doing. You should make sure you get enough calcium and vitamin D while you are taking this medicine, unless your doctor tells you not to. Discuss the foods you eat and the vitamins you take with your health care professional. Some people who take this medicine have severe bone, joint, and/or muscle pain. This medicine may  also increase your risk for a broken thigh bone. Tell your doctor right away if you have pain in your upper leg or groin. Tell your doctor if you have any pain that does not go away or that gets worse. This medicine can make you more sensitive to the sun. If you get a rash while taking this medicine, sunlight may cause the rash to get worse. Keep out of the sun. If you cannot avoid being in the sun, wear protective clothing and use sunscreen. Do not use sun lamps or tanning beds/booths. What side effects may I notice from receiving this medicine? Side effects that you should report to your doctor or health care professional  as soon as possible: -allergic reactions like skin rash, itching or hives, swelling of the face, lips, or tongue -black or tarry stools -bone, muscle or joint pain -changes in vision -chest pain -heartburn or stomach pain -jaw pain, especially after dental work -pain or trouble when swallowing -redness, blistering, peeling or loosening of the skin, including inside the mouth Side effects that usually do not require medical attention (report to your doctor or health care professional if they continue or are bothersome): -changes in taste -diarrhea or constipation -eye pain or itching -headache -nausea or vomiting -stomach gas or fullness This list may not describe all possible side effects. Call your doctor for medical advice about side effects. You may report side effects to FDA at 1-800-FDA-1088. Where should I keep my medicine? Keep out of the reach of children. Store at room temperature of 15 and 30 degrees C (59 and 86 degrees F). Throw away any unused medicine after the expiration date. NOTE: This sheet is a summary. It may not cover all possible information. If you have questions about this medicine, talk to your doctor, pharmacist, or health care provider.  2012, [employer] Standard. (12/16/2010 8:56:46 AM)  Edema Edema is an abnormal build-up of fluids in tissues. Because this is partly dependent on gravity (water flows to the lowest place), it is more common in the legs and thighs (lower extremities). It is also common in the looser tissues, like around the eyes. Painless swelling of the feet and ankles is common and increases as a person ages. It may affect both legs and may include the calves or even thighs. When squeezed, the fluid may move out of the affected area and may leave a dent for a few moments. CAUSES   Prolonged standing or sitting in one place for extended periods of time. Movement helps pump tissue fluid into the veins, and absence of movement prevents this,  resulting in edema.  Varicose veins. The valves in the veins do not work as well as they should. This causes fluid to leak into the tissues.  Fluid and salt overload.  Injury, burn, or surgery to the leg, ankle, or foot, may damage veins and allow fluid to leak out.  Sunburn damages vessels. Leaky vessels allow fluid to go out into the sunburned tissues.  Allergies (from insect bites or stings, medications or chemicals) cause swelling by allowing vessels to become leaky.  Protein in the blood helps keep fluid in your vessels. Low protein, as in malnutrition, allows fluid to leak out.  Hormonal changes, including pregnancy and menstruation, cause fluid retention. This fluid may leak out of vessels and cause edema.  Medications that cause fluid retention. Examples are sex hormones, blood pressure medications, steroid treatment, or anti-depressants.  Some illnesses cause edema, especially heart failure, kidney disease, or liver  disease.  Surgery that cuts veins or lymph nodes, such as surgery done for the heart or for breast cancer, may result in edema. DIAGNOSIS  Your caregiver is usually easily able to determine what is causing your swelling (edema) by simply asking what is wrong (getting a history) and examining you (doing a physical). Sometimes x-rays, EKG (electrocardiogram or heart tracing), and blood work may be done to evaluate for underlying medical illness. TREATMENT  General treatment includes:  Leg elevation (or elevation of the affected body part).  Restriction of fluid intake.  Prevention of fluid overload.  Compression of the affected body part. Compression with elastic bandages or support stockings squeezes the tissues, preventing fluid from entering and forcing it back into the blood vessels.  Diuretics (also called water pills or fluid pills) pull fluid out of your body in the form of increased urination. These are effective in reducing the swelling, but can have side  effects and must be used only under your caregiver's supervision. Diuretics are appropriate only for some types of edema. The specific treatment can be directed at any underlying causes discovered. Heart, liver, or kidney disease should be treated appropriately. HOME CARE INSTRUCTIONS   Elevate the legs (or affected body part) above the level of the heart, while lying down.  Avoid sitting or standing still for prolonged periods of time.  Avoid putting anything directly under the knees when lying down, and do not wear constricting clothing or garters on the upper legs.  Exercising the legs causes the fluid to work back into the veins and lymphatic channels. This may help the swelling go down.  The pressure applied by elastic bandages or support stockings can help reduce ankle swelling.  A low-salt diet may help reduce fluid retention and decrease the ankle swelling.  Take any medications exactly as prescribed. SEEK MEDICAL CARE IF:  Your edema is not responding to recommended treatments. SEEK IMMEDIATE MEDICAL CARE IF:   You develop shortness of breath or chest pain.  You cannot breathe when you lay down; or if, while lying down, you have to get up and go to the window to get your breath.  You are having increasing swelling without relief from treatment.  You develop a fever over 102 F (38.9 C).  You develop pain or redness in the areas that are swollen.  Tell your caregiver right away if you have gained 3 lb/1.4 kg in 1 day or 5 lb/2.3 kg in a week. MAKE SURE YOU:   Understand these instructions.  Will watch your condition.  Will get help right away if you are not doing well or get worse. Document Released: 06/19/2005 Document Revised: 12/19/2011 Document Reviewed: 02/05/2008 [facility] Patient Information 2013 [place], [place].

## 2012-11-19 NOTE — Progress Notes (Signed)
Case discussed with Dr. Qureshi (at time of visit, soon after the resident saw the patient).  We reviewed the resident's history and exam and pertinent patient test results.  I agree with the assessment, diagnosis, and plan of care documented in the resident's note.  

## 2012-11-21 ENCOUNTER — Other Ambulatory Visit: Payer: Self-pay | Admitting: Internal Medicine

## 2012-11-24 DIAGNOSIS — F339 Major depressive disorder, recurrent, unspecified: Secondary | ICD-10-CM | POA: Insufficient documentation

## 2012-11-24 DIAGNOSIS — K219 Gastro-esophageal reflux disease without esophagitis: Secondary | ICD-10-CM | POA: Insufficient documentation

## 2012-11-24 NOTE — Progress Notes (Addendum)
Subjective:   Patient ID: Jill Hancock female   DOB: 12/22/1940 72 y.o.   MRN: [id number]  HPI: Jill Hancock is a 72 y.o. white female with PMH of HTN and CVA presenting to clinic today for follow up visit.  She called last week complaining of abdominal discomfort and heartburn which has since then resolved since she restarted her nexium.  She currently denies any complaints and wishes to continue nexium.  She also reports not starting her alendronate yet due to her still reviewing all the side effects but is willing to start at this time.    She also still has some mild lower extremity edema but says it is likely due to her not taking her lasix lately.    Finally, [name] discusses continuing to feel depressed and "down" all the time.  She complains of decreased interest and pleasure and also decreased appetite.  She claims she was depressed after her first husbands passing and never truly recovered but try counseling and therapy at this time.  She denies any suicidal or homicidal ideation and reports sleeping well throughout the night.  She is asking about prozac today as she has seen her daughter have success with it and does not wish to go to therapy or be referred at this time.  She also continues to worry about having another stroke despite improvement of her symptoms and control of risk factors.    Past Medical History  Diagnosis Date  . Hypertension   . Vertigo   . Stroke 02/2012    MRI revealed at least 3 subcentimeter acute infarctions with one area of subacute infarction in widely disparate vascular territories including territory of left cerebellum and around right caudate nucleus along with widespread lacunar infarcts, chronic microvascular ischemic change, and numerous microbleeds suggesting long standing hypertensive cerebrovascular disease.  MRA confirmed intracranial   Current Outpatient Prescriptions  Medication Sig Dispense Refill  . amLODipine (NORVASC) 5 MG  tablet Take 1 tablet (5 mg total) by mouth daily.  30 tablet  11  . aspirin 325 MG tablet Take 1 tablet (325 mg total) by mouth daily.  30 tablet  5  . Calcium Carbonate-Vitamin D 600-400 MG-UNIT per chew tablet Chew 2 tablets by mouth daily.  60 tablet  5  . diazepam (VALIUM) 2 MG tablet Take 1 mg by mouth daily as needed. For dizziness.       . diclofenac sodium (VOLTAREN) 1 % GEL Apply 2 g topically 4 (four) times daily.  1 Tube  1  . furosemide (LASIX) 20 MG tablet Take 1 tablet (20 mg total) by mouth daily as needed (for leg swelling).  30 tablet  11  . lisinopril (PRINIVIL,ZESTRIL) 40 MG tablet TAKE 1 TABLET BY MOUTH DAILY  30 tablet  5  . meclizine (ANTIVERT) 25 MG tablet TAKE 1 TABLET BY MOUTH THREE TIMES A DAY AS NEEDED FOR VERTIGO  30 tablet  2  . NEXIUM 20 MG capsule       . pravastatin (PRAVACHOL) 20 MG tablet Take 1 tablet (20 mg total) by mouth every evening.  30 tablet  11  . zoster vaccine live, PF, (ZOSTAVAX) 78295 UNT/0.65ML injection Inject 19,400 Units into the skin once.  1 each  0  . alendronate (FOSAMAX) 70 MG tablet Take 1 tablet (70 mg total) by mouth every 7 (seven) days. Take with full glass of water on empty stomach  12 tablet  5  . FLUoxetine (PROZAC) 10 MG capsule Take  1 capsule (10 mg total) by mouth daily.  30 capsule  1  . KLOR-CON 10 10 MEQ tablet TAKE 1 TABLET (10 MEQ TOTAL) BY MOUTH 2 (TWO) TIMES DAILY.  60 tablet  0  . nitrofurantoin, macrocrystal-monohydrate, (MACROBID) 100 MG capsule Take 1 capsule (100 mg total) by mouth 2 (two) times daily.  14 capsule  0   No current facility-administered medications for this visit.   Family History  Problem Relation Age of Onset  . Heart attack Brother     death at age 72  . Stroke Brother     death at 64   History   Social History  . Marital Status: Unknown    Spouse Name: N/A    Number of Children: N/A  . Years of Education: N/A   Social History Main Topics  . Smoking status: Never Smoker   . Smokeless  tobacco: None  . Alcohol Use: 0.6 oz/week    1 Glasses of wine per week     Comment: occasionally  . Drug Use: No  . Sexually Active: None   Other Topics Concern  . None   Social History Narrative  . None   Review of Systems: Constitutional: Denies fever, chills, diaphoresis, appetite change and fatigue.  HEENT: Denies photophobia, eye pain, redness, hearing loss, ear pain, congestion, sore throat, rhinorrhea, sneezing, mouth sores, trouble swallowing, neck pain, neck stiffness and tinnitus.   Respiratory: Denies SOB, DOE, cough, chest tightness,  and wheezing.   Cardiovascular: Denies chest pain and palpitations.  Gastrointestinal: Denies nausea, vomiting, abdominal pain, diarrhea, constipation, blood in stool and abdominal distention.  Genitourinary: Denies dysuria, urgency, frequency, hematuria, flank pain and difficulty urinating.  Endocrine: Denies: hot or cold intolerance, sweats, changes in hair or nails, polyuria, polydipsia. Musculoskeletal: lower extremity edema.  Denies myalgias, back pain, joint swelling, arthralgias and gait problem.  Skin: Denies pallor, rash and wound.  Neurological: Denies dizziness, seizures, syncope, weakness, light-headedness, numbness and headaches.  Hematological: Denies adenopathy. Easy bruising, personal or family bleeding history  Psychiatric/Behavioral: Denies suicidal ideation, mood changes, confusion, nervousness, sleep disturbance and agitation.  Feeling down, decreased interest, and depressed.    Objective:  Physical Exam: Filed Vitals:   11/19/12 1517  BP: 139/80  Pulse: 76  Temp: 96.7 F (35.9 C)  TempSrc: Oral  Weight: 181 lb 3.2 oz (82.192 kg)  SpO2: 98%   General: AAOX3, NAD Head: Normocephalic and atraumatic.  Eyes: Vision grossly intact, PERRLA, EOMI  Mouth: Pharynx pink and moist, no erythema, and no exudates.  Neck: Supple, full ROM Lungs: Normal respiratory effort, CTA B/L, no accessory muscle use, no crackles, and no  wheezes. Heart: Normal rate, regular rhythm, no murmur, no gallop, and no rub.  Abdomen: Soft, non-tender, normal bowel sounds, non-distended, non-obese Msk: No joint swelling, no joint warmth, and no redness over joints.  Pulses: 2+ DP/PT pulses bilaterally Extremities: No cyanosis, clubbing.  B/l lower extremity mild edema.   Neurologic: Alert & oriented X3, cranial nerves II-XII intact, strength normal in all extremities, sensation intact to light touch, and gait normal.  Skin: Turgor normal and no rashes.  Psych: Oriented X3, memory intact for recent and remote, normally interactive, good eye contact, not anxious appearing, and not depressed appearing. Assessment & Plan:  Discussed with Dr. Aundria Rud  Has not been taking her lasix and has not started alendronate.  Will resume both. Start low dose prozac F/u in 1 month Dysuria and moderate leukocytes: macrobid x7 days Pneumococcal vaccine given today

## 2012-11-24 NOTE — Assessment & Plan Note (Signed)
Continue nexium 

## 2012-11-24 NOTE — Assessment & Plan Note (Signed)
Has not started her alendronate yet but is willing to start and monitor for any side effects.    -start alendronate -counseled again on side effects and instructions on proper way to take medication

## 2012-11-24 NOTE — Assessment & Plan Note (Signed)
Complaining of dysuria and increased frequency.  Urine dipstick: moderate leukocytes and trace RBC.  -macrobid x7 days

## 2012-11-24 NOTE — Assessment & Plan Note (Signed)
BP Readings from Last 3 Encounters:  11/19/12 139/80  09/17/12 128/81  08/21/12 133/84   Lab Results  Component Value Date   NA 143 09/17/2012   K 4.0 09/17/2012   CREATININE 0.79 09/17/2012   Assessment: Blood pressure control: controlled Progress toward BP goal:  at goal  Plan: Medications:  Continue current regimen: Norvasc 5mg  and Lisinopril 40mg .  Has not been taking her lasix lately Educational resources provided: brochure Self management tools provided: home blood pressure logbook Other plans: restart lasix and potassium supplementation

## 2012-11-24 NOTE — Assessment & Plan Note (Signed)
Continues to have mild b/l lower extremity edema.  Has not been taking her lasix lately.    -restart low dose lasix -elevation

## 2012-11-24 NOTE — Assessment & Plan Note (Signed)
Per PHQ2: claims to have little interest or pleasure in doing things nearly every day (3) and feeling down, depressed, or hopeless more than half the days (2), especially after her stroke.  Total PHQ-2 score 5, 56.4% probability of MDD and 84.6% probability of any depressive disorder 84.6%.    She denies any suicidal or homicidal ideation.  Is not willing to talk with a therapist or counseling at this time, as she claims she did everything when her first husband passed away and did not have much success.  She inquires about trying prozac, claiming her daughter has had success with that.  We discussed antidepressant medications and side-effect profile along with affordability.  After discussion with patient about the various medications including celexa, prozac, and wellbutrin, she wishes to try low dose prozac at this time.    -start low dose prozac 10mg  qd and slowly titrate up if needed

## 2012-12-02 ENCOUNTER — Telehealth: Payer: Self-pay | Admitting: *Deleted

## 2012-12-02 NOTE — Telephone Encounter (Signed)
Three days should be enough for simple UTI.  Stop macrobid and list as "allergy".  If UTI sx recur, should be seen for diagnosis and urine C & S.

## 2012-12-02 NOTE — Telephone Encounter (Signed)
Pt called - no answer, message left ; instructed to call if she has any questions.

## 2012-12-02 NOTE — Telephone Encounter (Signed)
Returned pt's call - Pt states Macrobid is making her "sick as a dog"; nauseated and sick all the time. She took med x 3 days. Allergic to PCN. Informed her Dr Virgina Organ is on vacation.

## 2012-12-07 ENCOUNTER — Other Ambulatory Visit: Payer: Self-pay | Admitting: Internal Medicine

## 2012-12-14 ENCOUNTER — Encounter (HOSPITAL_COMMUNITY): Payer: Self-pay | Admitting: Emergency Medicine

## 2012-12-14 ENCOUNTER — Emergency Department (HOSPITAL_COMMUNITY): Payer: Medicare Other

## 2012-12-14 ENCOUNTER — Emergency Department (HOSPITAL_COMMUNITY)
Admission: EM | Admit: 2012-12-14 | Discharge: 2012-12-14 | Disposition: A | Discharge status: Home / Self Care | Payer: Medicare Other | Attending: Emergency Medicine | Admitting: Emergency Medicine

## 2012-12-14 DIAGNOSIS — Y92009 Unspecified place in unspecified non-institutional (private) residence as the place of occurrence of the external cause: Secondary | ICD-10-CM | POA: Insufficient documentation

## 2012-12-14 DIAGNOSIS — Z79899 Other long term (current) drug therapy: Secondary | ICD-10-CM | POA: Insufficient documentation

## 2012-12-14 DIAGNOSIS — Z8673 Personal history of transient ischemic attack (TIA), and cerebral infarction without residual deficits: Secondary | ICD-10-CM | POA: Insufficient documentation

## 2012-12-14 DIAGNOSIS — W010XXA Fall on same level from slipping, tripping and stumbling without subsequent striking against object, initial encounter: Secondary | ICD-10-CM | POA: Insufficient documentation

## 2012-12-14 DIAGNOSIS — R110 Nausea: Secondary | ICD-10-CM | POA: Insufficient documentation

## 2012-12-14 DIAGNOSIS — I10 Essential (primary) hypertension: Secondary | ICD-10-CM | POA: Insufficient documentation

## 2012-12-14 DIAGNOSIS — S42213A Unspecified displaced fracture of surgical neck of unspecified humerus, initial encounter for closed fracture: Secondary | ICD-10-CM | POA: Insufficient documentation

## 2012-12-14 DIAGNOSIS — IMO0002 Reserved for concepts with insufficient information to code with codable children: Secondary | ICD-10-CM | POA: Insufficient documentation

## 2012-12-14 DIAGNOSIS — Z7982 Long term (current) use of aspirin: Secondary | ICD-10-CM | POA: Insufficient documentation

## 2012-12-14 DIAGNOSIS — Z880 Allergy status to penicillin: Secondary | ICD-10-CM | POA: Insufficient documentation

## 2012-12-14 DIAGNOSIS — Z8669 Personal history of other diseases of the nervous system and sense organs: Secondary | ICD-10-CM | POA: Insufficient documentation

## 2012-12-14 DIAGNOSIS — S42302A Unspecified fracture of shaft of humerus, left arm, initial encounter for closed fracture: Secondary | ICD-10-CM

## 2012-12-14 DIAGNOSIS — Y9389 Activity, other specified: Secondary | ICD-10-CM | POA: Insufficient documentation

## 2012-12-14 MED ORDER — HYDROCODONE-ACETAMINOPHEN 5-325 MG PO TABS: 1.0000 | ORAL_TABLET | Freq: Four times a day (QID) | ORAL | Status: DC | PRN

## 2012-12-14 MED ORDER — ACETAMINOPHEN 325 MG PO TABS
650.0000 mg | ORAL_TABLET | Freq: Once | ORAL | Status: AC
  Administered 2012-12-14: 650 mg via ORAL
  Filled 2012-12-14: qty 2

## 2012-12-14 NOTE — ED Notes (Signed)
Pt dc to home. Pt sts understanding to dc instructions. Pt ambulatory to exit without difficulty. 

## 2012-12-14 NOTE — ED Provider Notes (Signed)
History  This chart was scribed for non-physician practitioner Magnus Sinning, working with Doug Sou, MD, by Yevette Edwards, ED Scribe. This patient was seen in room TR10C/TR10C and the patient's care was started at 8:44 PM   CSN: [id number]  Arrival date & time 12/14/12  1949   First MD Initiated Contact with Patient 12/14/12 2023      Chief Complaint  Patient presents with  . Fall    The history is provided by the patient. No language interpreter was used.   HPI Comments: Jill Hancock is a 72 y.o. female who presents to the Emergency Department complaining of pain to her left upper arm and shoulder due to a fall she experienced this evening when she slipped on her floor. She states that she landed on her left arm when she fell.  She states that she was ambulatory after the fall. She states that she has experienced nausea, but she credits it to the pain. She denies any LOC, emesis, or hitting her head. No changes in vision.  She denies numbness or tingling of the left arm.  She also denies any pain to her neck, back, wrist, and elbow.  She denies taking any previous pain medication prior to arrival.   Past Medical History  Diagnosis Date  . Hypertension   . Vertigo   . Stroke 02/2012    MRI revealed at least 3 subcentimeter acute infarctions with one area of subacute infarction in widely disparate vascular territories including territory of left cerebellum and around right caudate nucleus along with widespread lacunar infarcts, chronic microvascular ischemic change, and numerous microbleeds suggesting long standing hypertensive cerebrovascular disease.  MRA confirmed intracranial    Past Surgical History  Procedure Laterality Date  . Cholecystectomy      Family History  Problem Relation Age of Onset  . Heart attack Brother     death at age 69  . Stroke Brother     death at 62    History  Substance Use Topics  . Smoking status: Never Smoker   . Smokeless  tobacco: Not on file  . Alcohol Use: 0.6 oz/week    1 Glasses of wine per week     Comment: occasionally    No OB history provided.   Review of Systems  HENT: Negative for neck pain.   Gastrointestinal: Positive for nausea (Pt said she was nauseous due to the pain. ). Negative for vomiting.  Musculoskeletal: Positive for back pain (Slight pain to the right side) and arthralgias (Left upper arm and shoulder pain. ).  Neurological: Negative for syncope.  All other systems reviewed and are negative.   Allergies  Demerol; Macrobid; and Penicillins  Home Medications   Current Outpatient Rx  Name  Route  Sig  Dispense  Refill  . amLODipine (NORVASC) 5 MG tablet   Oral   Take 1 tablet (5 mg total) by mouth daily.   30 tablet   11   . aspirin 325 MG tablet   Oral   Take 1 tablet (325 mg total) by mouth daily.   30 tablet   5   . Calcium Carbonate-Vitamin D 600-400 MG-UNIT per chew tablet   Oral   Chew 2 tablets by mouth daily.   60 tablet   5   . diazepam (VALIUM) 2 MG tablet   Oral   Take 1 mg by mouth at bedtime as needed for anxiety. For dizziness.         . diclofenac  sodium (VOLTAREN) 1 % GEL   Topical   Apply 2 g topically 4 (four) times daily.   1 Tube   1   . FLUoxetine (PROZAC) 10 MG capsule   Oral   Take 1 capsule (10 mg total) by mouth daily.   30 capsule   1   . furosemide (LASIX) 20 MG tablet   Oral   Take 1 tablet (20 mg total) by mouth daily as needed (for leg swelling).   30 tablet   11   . lisinopril (PRINIVIL,ZESTRIL) 40 MG tablet   Oral   Take 40 mg by mouth daily.         . meclizine (ANTIVERT) 25 MG tablet   Oral   Take 25 mg by mouth 3 (three) times daily as needed for dizziness.         . potassium chloride (K-DUR,KLOR-CON) 10 MEQ tablet   Oral   Take 10 mEq by mouth daily.         . pravastatin (PRAVACHOL) 20 MG tablet   Oral   Take 1 tablet (20 mg total) by mouth every evening.   30 tablet   11     Triage  Vitals: BP 164/84  Pulse 94  Temp(Src) 98.7 F (37.1 C) (Oral)  Resp 16  SpO2 95%  Physical Exam  Nursing note and vitals reviewed. Constitutional: She is oriented to person, place, and time. She appears well-developed and well-nourished. No distress.  HENT:  Head: Normocephalic and atraumatic.  Eyes: EOM are normal.  Neck: Neck supple. No tracheal deviation present.  Cardiovascular: Normal rate, regular rhythm and normal heart sounds.   Pulses:      Radial pulses are 2+ on the right side, and 2+ on the left side.       Dorsalis pedis pulses are 2+ on the right side, and 2+ on the left side.  Pulmonary/Chest: Effort normal and breath sounds normal. No respiratory distress.  Musculoskeletal:       Left shoulder: She exhibits decreased range of motion, tenderness and bony tenderness. She exhibits no swelling, no deformity and normal pulse.       Left elbow: She exhibits normal range of motion, no swelling, no effusion and no deformity. No tenderness found.       Left wrist: She exhibits normal range of motion, no tenderness, no bony tenderness, no swelling, no effusion and no deformity.       Cervical back: She exhibits normal range of motion, no tenderness, no bony tenderness, no swelling, no edema and no deformity.       Thoracic back: She exhibits normal range of motion, no tenderness, no bony tenderness, no swelling, no edema and no deformity.       Lumbar back: She exhibits normal range of motion, no tenderness, no bony tenderness, no swelling, no edema and no deformity.  Sensations of left arm and hand are intact.   Neurological: She is alert and oriented to person, place, and time. She has normal strength. No cranial nerve deficit or sensory deficit. Gait normal.  Skin: Skin is warm and dry.  Psychiatric: She has a normal mood and affect. Her behavior is normal.    ED Course  Procedures (including critical care time)  DIAGNOSTIC STUDIES: Oxygen Saturation is 95% on room air,  adequate by my interpretation.    COORDINATION OF CARE:  10:09 PM- Discussed treatment plan with pt which included the findings of the x-rays and pain medication, for which the  pt requested 3 ibuprofen. . Pt agreed.   10:15 PM- Consulted with Dr. Ethelda Chick.     Labs Reviewed - No data to display Dg Shoulder Left  12/14/2012   *RADIOLOGY REPORT*  Clinical Data: Pain post fall  LEFT SHOULDER - 2+ VIEW  Comparison: None.  Findings: Transverse surgical neck fracture, mildly impacted.  No dislocation.  No other acute bony abnormality evident.  IMPRESSION:  Impacted surgical neck fracture, proximal left humerus   Original Report Authenticated By: D. Andria Rhein, MD   Dg Humerus Left  12/14/2012   *RADIOLOGY REPORT*  Clinical Data: Pain post fall  LEFT HUMERUS - 2+ VIEW  Comparison: None.  Findings: Transverse impacted fracture of the surgical neck of the humerus.  Possible intra-articular extension to the subchondral cortex of the femoral head, not well profiled.  No dislocation.  No other bony abnormalities evident.  IMPRESSION:  1.  Impacted surgical neck fracture, left humerus   Original Report Authenticated By: D. Andria Rhein, MD     No diagnosis found.  Patient discussed with and also evaluated by Dr. Ethelda Chick  MDM  Patient presenting with left shoulder pain after a mechanical fall that occurred earlier today.  Xray shows a nondisplaced surgical neck fracture of the left humerus.  Fracture is a closed fracture.  Patient neurovascularly intact.  She did not hit her head when she fell.  No LOC.  No other pain.  Feel that patient is stable for discharge.  Patient discharged home with pain medication and referral to Orthopedics.  I personally performed the services described in this documentation, which was scribed in my presence. The recorded information has been reviewed and is accurate.    Pascal Lux Martelle, PA-C 12/14/12 2258

## 2012-12-14 NOTE — ED Notes (Signed)
PT. SLIPPED AND FELL AT HOME THIS EVENING , NO LOC / AMBULATORY / ALERT AND ORIENTED , PT. STATED PAIN AT LEFT UPPER ARM / LEFT SHOULDER.

## 2012-12-14 NOTE — Progress Notes (Signed)
Orthopedic Tech Progress Note Patient Details:  Jill Hancock [facility] Mar 13, 1941 [id number]  Ortho Devices Type of Ortho Device: Shoulder immobilizer Ortho Device/Splint Location: LUE Ortho Device/Splint Interventions: Ordered;Application   Jennye Moccasin 12/14/2012, 10:33 PM

## 2012-12-14 NOTE — ED Provider Notes (Addendum)
Patient suffered mechanical fall landing on left upper extremity tonight. Pain is a left shoulder, mild. No other complaint. Results for orders placed in visit on 11/19/12  POCT URINALYSIS DIPSTICK      Result Value Range   Color, UA Lt yellow     Clarity, UA slight cloudy     Glucose, UA negative     Bilirubin, UA negative     Ketones, UA negative     Spec Grav, UA <=1.005     Blood, UA trace-intact     pH, UA 6.0     Protein, UA negative     Urobilinogen, UA 0.2     Nitrite, UA negative     Leukocytes, UA moderate (2+)     Dg Shoulder Left  12/14/2012   *RADIOLOGY REPORT*  Clinical Data: Pain post fall  LEFT SHOULDER - 2+ VIEW  Comparison: None.  Findings: Transverse surgical neck fracture, mildly impacted.  No dislocation.  No other acute bony abnormality evident.  IMPRESSION:  Impacted surgical neck fracture, proximal left humerus   Original Report Authenticated By: D. Andria Rhein, MD   Dg Humerus Left  12/14/2012   *RADIOLOGY REPORT*  Clinical Data: Pain post fall  LEFT HUMERUS - 2+ VIEW  Comparison: None.  Findings: Transverse impacted fracture of the surgical neck of the humerus.  Possible intra-articular extension to the subchondral cortex of the femoral head, not well profiled.  No dislocation.  No other bony abnormalities evident.  IMPRESSION:  1.  Impacted surgical neck fracture, left humerus   Original Report Authenticated By: D. Andria Rhein, MD    X-ray viewed by me Doug Sou, MD 12/14/12 2351  Doug Sou, MD 12/14/12 2351

## 2012-12-15 NOTE — ED Provider Notes (Signed)
Medical screening examination/treatment/procedure(s) were conducted as a shared visit with non-physician practitioner(s) and myself.  I personally evaluated the patient during the encounter  Doug Sou, MD 12/15/12 0130

## 2012-12-24 ENCOUNTER — Telehealth: Payer: Self-pay | Admitting: *Deleted

## 2012-12-24 ENCOUNTER — Encounter: Payer: Medicare Other | Admitting: Internal Medicine

## 2012-12-24 NOTE — Telephone Encounter (Signed)
Call from pt - stated she felled and broke her arm; wants to cancel her appt today w/Dr Virgina Organ.

## 2013-01-02 ENCOUNTER — Encounter: Payer: Medicare Other | Admitting: Internal Medicine

## 2013-01-20 ENCOUNTER — Inpatient Hospital Stay: Admission: RE | Admit: 2013-01-20 | Payer: Medicare Other | Source: Ambulatory Visit

## 2013-01-22 ENCOUNTER — Other Ambulatory Visit: Payer: Self-pay | Admitting: Internal Medicine

## 2013-02-05 ENCOUNTER — Telehealth: Payer: Self-pay | Admitting: *Deleted

## 2013-02-05 NOTE — Telephone Encounter (Signed)
Call from pt - states she's nauseated all the time; appt given 8/26 w/Dr Virgina Organ.

## 2013-02-05 NOTE — Telephone Encounter (Signed)
If her nausea is severe, she should perhaps be seen in opc by another resident sooner.

## 2013-02-06 NOTE — Telephone Encounter (Signed)
Pt states she will wait until 8/26 to see you; but knows to call for a sooner appt if need be.

## 2013-02-25 ENCOUNTER — Ambulatory Visit (INDEPENDENT_AMBULATORY_CARE_PROVIDER_SITE_OTHER): Payer: Medicare Other | Admitting: Internal Medicine

## 2013-02-25 ENCOUNTER — Encounter: Payer: Self-pay | Admitting: Internal Medicine

## 2013-02-25 VITALS — BP 128/80 | HR 76 | Temp 97.1°F | Ht 64.0 in | Wt 177.0 lb

## 2013-02-25 DIAGNOSIS — F329 Major depressive disorder, single episode, unspecified: Secondary | ICD-10-CM

## 2013-02-25 DIAGNOSIS — S42302D Unspecified fracture of shaft of humerus, left arm, subsequent encounter for fracture with routine healing: Secondary | ICD-10-CM

## 2013-02-25 DIAGNOSIS — M810 Age-related osteoporosis without current pathological fracture: Secondary | ICD-10-CM

## 2013-02-25 DIAGNOSIS — Z23 Encounter for immunization: Secondary | ICD-10-CM

## 2013-02-25 DIAGNOSIS — S42309D Unspecified fracture of shaft of humerus, unspecified arm, subsequent encounter for fracture with routine healing: Secondary | ICD-10-CM

## 2013-02-25 DIAGNOSIS — I6350 Cerebral infarction due to unspecified occlusion or stenosis of unspecified cerebral artery: Secondary | ICD-10-CM

## 2013-02-25 DIAGNOSIS — I639 Cerebral infarction, unspecified: Secondary | ICD-10-CM

## 2013-02-25 DIAGNOSIS — S42302A Unspecified fracture of shaft of humerus, left arm, initial encounter for closed fracture: Secondary | ICD-10-CM | POA: Insufficient documentation

## 2013-02-25 DIAGNOSIS — R110 Nausea: Secondary | ICD-10-CM

## 2013-02-25 DIAGNOSIS — I10 Essential (primary) hypertension: Secondary | ICD-10-CM

## 2013-02-25 DIAGNOSIS — E876 Hypokalemia: Secondary | ICD-10-CM

## 2013-02-25 DIAGNOSIS — Z87440 Personal history of urinary (tract) infections: Secondary | ICD-10-CM

## 2013-02-25 LAB — POCT URINALYSIS DIPSTICK
Nitrite, UA: NEGATIVE
Urobilinogen, UA: 0.2
pH, UA: 6

## 2013-02-25 MED ORDER — FLUOXETINE HCL 10 MG PO CAPS: 20.0000 mg | ORAL_CAPSULE | Freq: Every day | ORAL | Status: DC

## 2013-02-25 MED ORDER — PRAVASTATIN SODIUM 20 MG PO TABS: 20.0000 mg | ORAL_TABLET | Freq: Every morning | ORAL | Status: DC

## 2013-02-25 MED ORDER — ALENDRONATE SODIUM 70 MG PO TABS: 70.0000 mg | ORAL_TABLET | ORAL | Status: DC

## 2013-02-25 NOTE — Assessment & Plan Note (Signed)
Usually in AM.  Thinks may be secondary to taking pravastatin at night.   -will switch to pravastatin in AM and try taking medications with food to see if that helps.  Noted to be on prozac that may also contribute to nausea. Denies vomiting.

## 2013-02-25 NOTE — Assessment & Plan Note (Addendum)
Not much improvement with prozac 10mg . Would like to increase to 20mg  daily.denies suicidal or homicidal ideation at this time.   -prozac 20mg  qd

## 2013-02-25 NOTE — Assessment & Plan Note (Addendum)
Mild dysuria.  -u/a with leukocytes trace blood, nitrite negative -urine culture pending

## 2013-02-25 NOTE — Assessment & Plan Note (Signed)
BP Readings from Last 3 Encounters:  02/25/13 128/80  12/14/12 135/87  11/19/12 139/80   Lab Results  Component Value Date   NA 143 09/17/2012   K 4.0 09/17/2012   CREATININE 0.79 09/17/2012   Assessment: Blood pressure control: controlled Progress toward BP goal:  at goal  Plan: Medications:  continue current medications norvasc 5mg  and lisinopril 40mg  daily. Occasionally takes lasix for swelling.  Educational resources provided: brochure

## 2013-02-25 NOTE — Assessment & Plan Note (Signed)
Will start alendronate now since she did not start it due to L humerus fracture in June.   -start alendronate and calcium and vitamin d

## 2013-02-25 NOTE — Assessment & Plan Note (Signed)
Follows with [facility], Dr. Ave Filter.  Healing over time, improved mobility per patient and less pain.

## 2013-02-25 NOTE — Patient Instructions (Addendum)
Take pravastatin every morning with your other morning medications with food  Increase your prozac to 20mg  daily  Start alendronate (take as prescribed make sure to drink water and stay upright) and continue calcium and vitamin d  Follow up with neurology and Dr. Ave Filter as scheduled  If your nausea continues or gets worse call and let us know  Return in 3-6 months  Alendronate tablets What is this medicine? ALENDRONATE (a LEN droe nate) slows calcium loss from bones. It helps to make normal healthy bone and to slow bone loss in people with Paget's disease and osteoporosis. It may be used in others at risk for bone loss. This medicine may be used for other purposes; ask your health care provider or pharmacist if you have questions. What should I tell my health care provider before I take this medicine? They need to know if you have any of these conditions: -dental disease -esophagus, stomach, or intestine problems, like acid reflux or GERD -kidney disease -low blood calcium -low vitamin D -problems sitting or standing 30 minutes -trouble swallowing -an unusual or allergic reaction to alendronate, other medicines, foods, dyes, or preservatives -pregnant or trying to get pregnant -breast-feeding How should I use this medicine? You must take this medicine exactly as directed or you will lower the amount of the medicine you absorb into your body or you may cause yourself harm. Take this medicine by mouth first thing in the morning, after you are up for the day. Do not eat or drink anything before you take your medicine. Swallow the tablet with a full glass (6 to 8 fluid ounces) of plain water. Do not take this medicine with any other drink. Do not chew or crush the tablet. After taking this medicine, do not eat breakfast, drink, or take any medicines or vitamins for at least 30 minutes. Sit or stand up for at least 30 minutes after you take this medicine; do not lie down. Do not take your  medicine more often than directed. Talk to your pediatrician regarding the use of this medicine in children. Special care may be needed. Overdosage: If you think you have taken too much of this medicine contact a poison control center or emergency room at once. NOTE: This medicine is only for you. Do not share this medicine with others. What if I miss a dose? If you miss a dose, do not take it later in the day. Continue your normal schedule starting the next morning. Do not take double or extra doses. What may interact with this medicine? -aluminum hydroxide -antacids -aspirin -calcium supplements -drugs for inflammation like ibuprofen, naproxen, and others -iron supplements -magnesium supplements -vitamins with minerals This list may not describe all possible interactions. Give your health care provider a list of all the medicines, herbs, non-prescription drugs, or dietary supplements you use. Also tell them if you smoke, drink alcohol, or use illegal drugs. Some items may interact with your medicine. What should I watch for while using this medicine? Visit your doctor or health care professional for regular checks ups. It may be some time before you see benefit from this medicine. Do not stop taking your medicine except on your doctor's advice. Your doctor or health care professional may order blood tests and other tests to see how you are doing. You should make sure you get enough calcium and vitamin D while you are taking this medicine, unless your doctor tells you not to. Discuss the foods you eat and the vitamins you  take with your health care professional. Some people who take this medicine have severe bone, joint, and/or muscle pain. This medicine may also increase your risk for a broken thigh bone. Tell your doctor right away if you have pain in your upper leg or groin. Tell your doctor if you have any pain that does not go away or that gets worse. This medicine can make you more  sensitive to the sun. If you get a rash while taking this medicine, sunlight may cause the rash to get worse. Keep out of the sun. If you cannot avoid being in the sun, wear protective clothing and use sunscreen. Do not use sun lamps or tanning beds/booths. What side effects may I notice from receiving this medicine? Side effects that you should report to your doctor or health care professional as soon as possible: -allergic reactions like skin rash, itching or hives, swelling of the face, lips, or tongue -black or tarry stools -bone, muscle or joint pain -changes in vision -chest pain -heartburn or stomach pain -jaw pain, especially after dental work -pain or trouble when swallowing -redness, blistering, peeling or loosening of the skin, including inside the mouth Side effects that usually do not require medical attention (report to your doctor or health care professional if they continue or are bothersome): -changes in taste -diarrhea or constipation -eye pain or itching -headache -nausea or vomiting -stomach gas or fullness This list may not describe all possible side effects. Call your doctor for medical advice about side effects. You may report side effects to FDA at 1-800-FDA-1088. Where should I keep my medicine? Keep out of the reach of children. Store at room temperature of 15 and 30 degrees C (59 and 86 degrees F). Throw away any unused medicine after the expiration date. NOTE: This sheet is a summary. It may not cover all possible information. If you have questions about this medicine, talk to your doctor, pharmacist, or health care provider.  2013, [employer]vier/[employer] Standard. (12/16/2010 8:56:09 AM)

## 2013-02-25 NOTE — Assessment & Plan Note (Signed)
Resolved. Latest lab work from [phone]: K 4.1 from [facility] and takes potassium daily

## 2013-02-25 NOTE — Progress Notes (Signed)
Subjective:   Patient ID: Jill Hancock female   DOB: 1941-06-10 72 y.o.   MRN: [id number]  HPI: [name] is a 72 y.o. white female with PMH of HTN, vertigo, and CVA presenting to opc today for routine follow up visit.  She is s/p impacted left humerus surgical neck fracture 12/14/12 with ortho follow up Dr. Ave Filter.  She says her arm has been healing and improved pain.  She has an appointment to see Dr. Ave Filter in 4-6 weeks.    In regards to her medications, she reports having nausea when she wakes up often.  She wonders if this is because of her pravastatin that she takes at night.  As such, we will switch to pravastatin in the morning with her other medications and also to take with food to see if that will help.  Additionally, since her fracture is healing, her orthopedic said she can start the alendronate which she will now get filled along with calcium and vitamin d. We did review proper way to take fosamax again and encouraged adequate water intake and make sure to not lie down after taking medication.  She is able to teach back the proper way to take her medications at this time and voices understanding. She denies vomiting.   In regards to her depression, she says her mood has improved slightly but not much.  She has been taking only 10mg  of the prozac and would like to increase to 20mg  dose at this time.  She denies any suicidal or homicidal ideation. She still has some shakiness feelings but says that is variable especially pending her husband's mood swings.  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    She has an appointment to see neurology next week.  She did get her flu vaccine today. She endorses mild dysuria but denies fever, chills, or abdominal pain.  She also denies tinnitus, chest pain, or SOB.   Finally, she brought in outside labs from [facility] today from 02/13/13 from and another provider ?L Brown:  HbA1C 6.1.  We discussed diet modifications today and cut down on starchy food. She admits to eating a log of bread. Vitamin d 25 hydroxy was still low, 23.2 and she gets injections from her other provider she claims. Her LDL is down to 95 and TG up to 175 with HDL 48.   Past Medical History  Diagnosis Date  . Hypertension   . Vertigo   . Stroke 02/2012    MRI revealed at least 3 subcentimeter acute infarctions with one area of subacute infarction in widely disparate vascular territories including territory of left cerebellum and around right caudate nucleus along with widespread lacunar infarcts, chronic microvascular ischemic change, and numerous microbleeds suggesting long standing hypertensive cerebrovascular disease.  MRA confirmed intracranial   Current Outpatient Prescriptions  Medication Sig Dispense Refill  . amLODipine (NORVASC) 5 MG tablet Take 1 tablet (5 mg total) by mouth daily.  30 tablet  11  . aspirin 325 MG tablet Take 1 tablet (325 mg total) by mouth daily.  30 tablet  5  . diazepam (VALIUM) 2 MG tablet Take 1 mg by mouth at bedtime as needed for anxiety. For dizziness.      Marland Kitchen FLUoxetine (PROZAC) 10 MG capsule Take 2 capsules (20 mg total) by mouth daily.  30 capsule  3  . lisinopril (PRINIVIL,ZESTRIL) 40 MG tablet TAKE 1 TABLET  BY MOUTH EVERY DAY  30 tablet  5  . meclizine (ANTIVERT) 25 MG tablet Take 25 mg by mouth 3 (three) times daily as needed for dizziness.      . potassium chloride (K-DUR,KLOR-CON) 10 MEQ tablet Take 10 mEq by mouth daily.      . pravastatin (PRAVACHOL) 20 MG tablet Take 1 tablet (20 mg total) by mouth every morning.  30 tablet  11  . alendronate (FOSAMAX) 70 MG tablet Take 1 tablet (70 mg total) by mouth every 7 (seven) days. Take with full glass of water on empty stomach  12 tablet  5  . Calcium Carbonate-Vitamin D 600-400 MG-UNIT per chew tablet Chew 2 tablets by mouth daily.  60 tablet  5  . diclofenac sodium (VOLTAREN) 1 % GEL Apply 2 g topically 4  (four) times daily.  1 Tube  1  . furosemide (LASIX) 20 MG tablet Take 1 tablet (20 mg total) by mouth daily as needed (for leg swelling).  30 tablet  11   No current facility-administered medications for this visit.   Family History  Problem Relation Age of Onset  . Heart attack Brother     death at age 8  . Stroke Brother     death at 21   History   Social History  . Marital Status: Unknown    Spouse Name: N/A    Number of Children: N/A  . Years of Education: N/A   Social History Main Topics  . Smoking status: Never Smoker   . Smokeless tobacco: None  . Alcohol Use: 0.6 oz/week    1 Glasses of wine per week     Comment: occasionally  . Drug Use: No  . Sexual Activity: None   Other Topics Concern  . None   Social History Narrative  . None   Review of Systems:  Constitutional:  Denies fever, chills, diaphoresis, appetite change and fatigue.   HEENT:  Denies congestion, sore throat, rhinorrhea, sneezing, mouth sores, trouble swallowing, neck pain   Respiratory:  Denies SOB, DOE, cough, and wheezing.   Cardiovascular:  Occasional leg swelling. Denies chest pain, palpitations  Gastrointestinal:  Nausea. Denies vomiting, abdominal pain, diarrhea, constipation, blood in stool and abdominal distention.   Genitourinary:  Mild dysuria. Denies flank pain and difficulty urinating.   Musculoskeletal:  L arm pain s/p fracture.    Skin:  Denies pallor, rash and wound.   Neurological:  Hx of cva. Denies seizures, syncope, weakness, numbness and headaches.    Objective:  Physical Exam: Filed Vitals:   02/25/13 1552  BP: 128/80  Pulse: 76  Temp: 97.1 F (36.2 C)  TempSrc: Oral  Height: 5\' 4"  (1.626 m)  Weight: 177 lb (80.287 kg)  SpO2: 97%   Vitals reviewed. General: sitting in chair, NAD HEENT: PERRL, EOMI, no scleral icterus Cardiac: RRR, no rubs, murmurs or gallops Pulm: clear to auscultation bilaterally, no wheezes, rales, or rhonchi Abd: soft, nontender,  nondistended, BS present Ext: warm and well perfused, mild L ankle edema, +2DP B/L, non-tender to palpatoin Neuro: alert and oriented X3, cranial nerves II-XII grossly intact, strength 3/5 LUE compared to 5/5 RUE, and 5/5 lower extremities and sensation to light touch equal in bilateral upper and lower extremities  Assessment & Plan:  Discussed with Dr. Aundria Rud Flu vaccine given today

## 2013-02-26 NOTE — Progress Notes (Signed)
Case discussed with Dr. Qureshi soon after the resident saw the patient.  We reviewed the resident's history and exam and pertinent patient test results.  I agree with the assessment, diagnosis, and plan of care documented in the resident's note. 

## 2013-03-05 ENCOUNTER — Inpatient Hospital Stay: Admission: RE | Admit: 2013-03-05 | Payer: Medicare Other | Source: Ambulatory Visit

## 2013-03-07 ENCOUNTER — Encounter: Payer: Self-pay | Admitting: Nurse Practitioner

## 2013-03-07 ENCOUNTER — Ambulatory Visit (INDEPENDENT_AMBULATORY_CARE_PROVIDER_SITE_OTHER): Payer: Medicare Other | Admitting: Nurse Practitioner

## 2013-03-07 VITALS — BP 133/77 | HR 72 | Temp 98.1°F | Ht 64.0 in | Wt 176.0 lb

## 2013-03-07 DIAGNOSIS — M810 Age-related osteoporosis without current pathological fracture: Secondary | ICD-10-CM

## 2013-03-07 DIAGNOSIS — I639 Cerebral infarction, unspecified: Secondary | ICD-10-CM

## 2013-03-07 DIAGNOSIS — I10 Essential (primary) hypertension: Secondary | ICD-10-CM

## 2013-03-07 DIAGNOSIS — I6350 Cerebral infarction due to unspecified occlusion or stenosis of unspecified cerebral artery: Secondary | ICD-10-CM

## 2013-03-07 MED ORDER — ASPIRIN 81 MG PO TABS: 81.0000 mg | ORAL_TABLET | Freq: Every day | ORAL | Status: DC

## 2013-03-07 NOTE — Progress Notes (Signed)
[facility]  PATIENT: Jill Hancock DOB: Nov 28, 1940   HISTORY FROM: patient, chart REASON FOR VISIT: routine follow up  HISTORY OF PRESENT ILLNESS:  72 year old Caucasian female here for her follow up left cerebral infarct  in August 2013 likely related to small vessel disease.  Vascular risk factors include hypertension.   She returns for followup after last visit on 05/22/12. She states she's done well and has not had any recurrent stroke or TIA symptoms. She were outpatient prolonged cardiac telemetry for 3 weeks which was negative for paroxysmal atrial fibrillation. She was recently diagnosed with osteoporosis on bone densitometry study on 08/23/12. She had basic metabolic panel labs drawn on 08/26/12 which unremarkable. She states her blood pressure is well controlled and it is 135/80 in office today. She is tolerating aspirin as well as July cerebral and her starting without any significant side effects. She has no new complaints today. She has been started on potassium replacement-she was also started on Lasix for swelling in her legs.  UPDATE 03/07/13 (LL): Patient returns for stroke follow up, it has been 1 year since stroke.   She has no residual neurological deficits.  She is taking an aspirin 325mg  daily, but she thinks it is causing her stomach irritation. She has no signs of bleeding or excessive bruising.  She states her blood pressure is better controlled after starting amlodipine, but she has more swelling in her ankles.  She fell and broke her left humerus in February.   She did not require surgery, but has favored it and now has decreased ROM in the left shoulder.  REVIEW OF SYSTEMS: Full 14 system review of systems performed and notable only for: constitutional: N/A  cardiovascular: N/A respiratory: N/A endocrine: feeling cold ear/nose/throat: N/A  Hematology/Lymph: N/A musculoskeletal: N/A skin: N/A genitourinary: N/A Gastrointestinal:  N/A allergy/immunology: N/A neurological: N/A sleep: N/A psychiatric: depression, mild   ALLERGIES: Allergies  Allergen Reactions  . Demerol [Meperidine] Nausea And Vomiting  . Macrobid [Nitrofurantoin Macrocrystal]   . Penicillins Rash    HOME MEDICATIONS: Outpatient Prescriptions Prior to Visit  Medication Sig Dispense Refill  . alendronate (FOSAMAX) 70 MG tablet Take 1 tablet (70 mg total) by mouth every 7 (seven) days. Take with full glass of water on empty stomach  12 tablet  5  . amLODipine (NORVASC) 5 MG tablet Take 1 tablet (5 mg total) by mouth daily.  30 tablet  11  . Calcium Carbonate-Vitamin D 600-400 MG-UNIT per chew tablet Chew 2 tablets by mouth daily.  60 tablet  5  . diazepam (VALIUM) 2 MG tablet Take 1 mg by mouth at bedtime as needed for anxiety. For dizziness.      . diclofenac sodium (VOLTAREN) 1 % GEL Apply 2 g topically 4 (four) times daily.  1 Tube  1  . FLUoxetine (PROZAC) 10 MG capsule Take 2 capsules (20 mg total) by mouth daily.  30 capsule  3  . furosemide (LASIX) 20 MG tablet Take 1 tablet (20 mg total) by mouth daily as needed (for leg swelling).  30 tablet  11  . lisinopril (PRINIVIL,ZESTRIL) 40 MG tablet TAKE 1 TABLET BY MOUTH EVERY DAY  30 tablet  5  . meclizine (ANTIVERT) 25 MG tablet Take 25 mg by mouth 3 (three) times daily as needed for dizziness.      . potassium chloride (K-DUR,KLOR-CON) 10 MEQ tablet Take 10 mEq by mouth daily.      . pravastatin (PRAVACHOL) 20 MG tablet Take  1 tablet (20 mg total) by mouth every morning.  30 tablet  11   No facility-administered medications prior to visit.    PAST MEDICAL HISTORY: Past Medical History  Diagnosis Date  . Hypertension   . Vertigo   . Stroke 02/2012    MRI revealed at least 3 subcentimeter acute infarctions with one area of subacute infarction in widely disparate vascular territories including territory of left cerebellum and around right caudate nucleus along with widespread lacunar  infarcts, chronic microvascular ischemic change, and numerous microbleeds suggesting long standing hypertensive cerebrovascular disease.  MRA confirmed intracranial  . Hyperglycemia   . Multiple cerebral infarctions august 2013  . Unsteady gait   . Hypokalemia     PAST SURGICAL HISTORY: Past Surgical History  Procedure Laterality Date  . Cholecystectomy    . Vaginal hysterectomy      FAMILY HISTORY: Family History  Problem Relation Age of Onset  . Heart attack Brother     death at age 33  . Stroke Brother     death at 70  . Heart attack Mother     SOCIAL HISTORY: History   Social History  . Marital Status: Unknown    Spouse Name: N/A    Number of Children: 5  . Years of Education: N/A   Occupational History  . RN    Social History Main Topics  . Smoking status: Never Smoker   . Smokeless tobacco: Not on file  . Alcohol Use: 0.6 oz/week    1 Glasses of wine per week     Comment: occasionally  . Drug Use: No  . Sexual Activity: Not on file   Other Topics Concern  . Not on file   Social History Narrative  . No narrative on file     PHYSICAL EXAM  Filed Vitals:   03/07/13 1432  BP: 133/77  Pulse: 72  Temp: 98.1 F (36.7 C)  TempSrc: Oral  Height: 5\' 4"  (1.626 m)  Weight: 176 lb (79.833 kg)   Body mass index is 30.2 kg/(m^2).  Generalized: In no acute distress   Neck: Supple, no carotid bruits   Cardiac: Regular rate rhythm, no murmur   Pulmonary: Clear to auscultation bilaterally   Musculoskeletal: No deformity   Skin: No petechiae or bruising. pedal edema 1 +  Neurological examination   Mentation: Alert oriented to time, place, history taking, language fluent, and casual conversation  Cranial nerve II-XII: Pupils were equal round reactive to light extraocular movements were full, visual field were full on confrontational test. facial sensation and strength were normal. hearing was intact to finger rubbing bilaterally. Uvula tongue  midline. head turning and shoulder shrug and were normal and symmetric.Tongue protrusion into cheek strength was normal. MOTOR: normal bulk and tone, full strength in the BUE, BLE, fine finger movements normal, no pronator drift. Decreased ROM left shoulder, 4/5 proximal strength left arm SENSORY: normal and symmetric to light touch, pinprick COORDINATION: finger-nose-finger, heel-to-shin bilaterally, there was no truncal ataxia REFLEXES: 1+ and symmetric GAIT/STATION: Rising up from seated position without assistance, normal stance, without trunk ataxia, moderate stride, good arm swing, smooth turning, able to perform tiptoe, and heel walking without difficulty. Tandem unsteady.   DIAGNOSTIC DATA (LABS, IMAGING, TESTING) - I reviewed patient records, labs, notes, testing and imaging myself where available.  Lab Results  Component Value Date   WBC 7.5 03/10/2012   HGB 14.9 03/10/2012   HCT 42.8 03/10/2012   MCV 86.6 03/10/2012   PLT 317 03/10/2012  Component Value Date/Time   NA 143 09/17/2012 1610   K 4.0 09/17/2012 1610   CL 104 09/17/2012 1610   CO2 28 09/17/2012 1610   GLUCOSE 73 09/17/2012 1610   BUN 11 09/17/2012 1610   CREATININE 0.79 09/17/2012 1610   CREATININE 0.82 03/10/2012 0945   CALCIUM 9.5 09/17/2012 1610   PROT 7.2 09/17/2012 1610   ALBUMIN 4.1 09/17/2012 1610   AST 32 09/17/2012 1610   ALT 32 09/17/2012 1610   ALKPHOS 71 09/17/2012 1610   BILITOT 0.4 09/17/2012 1610   GFRNONAA 71* 03/10/2012 0945   GFRAA 82* 03/10/2012 0945   Lab Results  Component Value Date   CHOL 145 06/19/2012   HDL 48 06/19/2012   LDLCALC 75 06/19/2012   TRIG 110 06/19/2012   CHOLHDL 3.0 06/19/2012   Lab Results  Component Value Date   HGBA1C 5.9* 02/26/2012   No results found for this basename: [id number]   Lab Results  Component Value Date   TSH 0.689 04/30/2012    ASSESSMENT AND PLAN 72 year old Caucasian female here for her follow up left cerebral infarct likely related to small vessel  disease.  Vascular risk factors include hypertension.     Change to  aspirin 81 mg orally every day  for secondary stroke prevention and maintain strict control of hypertension with blood pressure goal below 130/90, and lipids with LDL cholesterol goal below 100 mg/dL.   We will order repeat Carotid dopplers today. Followup in 6 months.   Blain Hunsucker NP-C 03/07/2013, 3:30 PM  [facility] [address] [place], [place] [address] [phone]

## 2013-03-07 NOTE — Patient Instructions (Addendum)
Continue aspirin 325 mg orally every day  for secondary stroke prevention and maintain strict control of hypertension with blood pressure goal below 130/90, diabetes with hemoglobin A1c goal below 6.5% and lipids with LDL cholesterol goal below 100 mg/dL.  I will discuss with Dr. Pearlean Brownie about switching to an 81mg  aspirin and call you either later this afternoon or Monday.  Someone from our office will call you to schedule repeat Carotid Dopplers.  Followup in the future with me in 6 months.

## 2013-03-13 ENCOUNTER — Ambulatory Visit (INDEPENDENT_AMBULATORY_CARE_PROVIDER_SITE_OTHER): Payer: Medicare Other

## 2013-03-13 DIAGNOSIS — I6350 Cerebral infarction due to unspecified occlusion or stenosis of unspecified cerebral artery: Secondary | ICD-10-CM

## 2013-03-13 DIAGNOSIS — I639 Cerebral infarction, unspecified: Secondary | ICD-10-CM

## 2013-03-18 ENCOUNTER — Other Ambulatory Visit: Payer: Self-pay | Admitting: Internal Medicine

## 2013-03-18 ENCOUNTER — Telehealth: Payer: Self-pay | Admitting: Nurse Practitioner

## 2013-03-18 ENCOUNTER — Telehealth: Payer: Self-pay | Admitting: *Deleted

## 2013-03-18 DIAGNOSIS — F329 Major depressive disorder, single episode, unspecified: Secondary | ICD-10-CM

## 2013-03-18 MED ORDER — FLUOXETINE HCL 20 MG PO CAPS: 20.0000 mg | ORAL_CAPSULE | Freq: Every day | ORAL | Status: DC

## 2013-03-18 NOTE — Telephone Encounter (Signed)
Both meds are prescribed by other providers. Toni Amend cox is the provider listed. Please forward there

## 2013-03-18 NOTE — Telephone Encounter (Signed)
Done, please cancel old prescription with pharmacy and confirm new one.  Thanks!

## 2013-03-18 NOTE — Telephone Encounter (Signed)
Call from pt - requesting Prozac 20mg  daily instead of 10mg  - take 2 tabs daily.  Thanks

## 2013-03-18 NOTE — Telephone Encounter (Signed)
Call completed to patient with no significant change since last carotid doppler study.  Continue current medications. Patient acknowledged results and had no questions.

## 2013-03-19 NOTE — Telephone Encounter (Signed)
[facility] informed of change for Prozac.

## 2013-03-28 ENCOUNTER — Other Ambulatory Visit: Payer: Self-pay | Admitting: Internal Medicine

## 2013-03-28 NOTE — Telephone Encounter (Signed)
Last filled by different provider. Is patient requesting this or is this automated?

## 2013-04-01 ENCOUNTER — Other Ambulatory Visit: Payer: Self-pay | Admitting: *Deleted

## 2013-04-01 MED ORDER — MECLIZINE HCL 25 MG PO TABS: 25.0000 mg | ORAL_TABLET | Freq: Three times a day (TID) | ORAL | Status: DC | PRN

## 2013-04-01 NOTE — Telephone Encounter (Signed)
Call from pt to refill antivert; states ENT doctor probably gave the original rx but "I'm not going back to them".

## 2013-04-14 ENCOUNTER — Other Ambulatory Visit (HOSPITAL_COMMUNITY): Payer: Self-pay | Admitting: Internal Medicine

## 2013-05-02 ENCOUNTER — Telehealth: Payer: Self-pay | Admitting: *Deleted

## 2013-05-02 NOTE — Telephone Encounter (Signed)
Pt had called yesterday about scheduling mammogram at [facility]. Marchelle Folks called me back this morning; stated as long as pt does not have Medicaid and any problems - pt can scheduled her own appt. Pt called and informed of this; stated she will schedule her own appt.

## 2013-05-13 ENCOUNTER — Other Ambulatory Visit (HOSPITAL_COMMUNITY): Payer: Self-pay | Admitting: Internal Medicine

## 2013-06-24 ENCOUNTER — Other Ambulatory Visit: Payer: Self-pay | Admitting: Internal Medicine

## 2013-06-30 ENCOUNTER — Telehealth: Payer: Self-pay | Admitting: *Deleted

## 2013-06-30 MED ORDER — DIAZEPAM 2 MG PO TABS: 1.0000 mg | ORAL_TABLET | Freq: Every evening | ORAL | Status: DC | PRN

## 2013-06-30 NOTE — Telephone Encounter (Signed)
Agree, thanks

## 2013-06-30 NOTE — Telephone Encounter (Signed)
Call from pt - states she might have a UTI; passing sm amount of blood and lower abd pain. No appts today; suggested Urgent Care. Appt tomorrow at [phone]; pt states she will take this appt but if pain increases, she will go to UC today.

## 2013-06-30 NOTE — Telephone Encounter (Signed)
Call from pt - requesting Diazepam refill.

## 2013-06-30 NOTE — Telephone Encounter (Signed)
Diazepam rx called to [facility]. 

## 2013-07-01 ENCOUNTER — Ambulatory Visit: Payer: Medicare Other | Admitting: Internal Medicine

## 2013-07-01 ENCOUNTER — Other Ambulatory Visit (HOSPITAL_COMMUNITY): Payer: Self-pay | Admitting: Internal Medicine

## 2013-07-04 NOTE — Telephone Encounter (Signed)
Is she still taking this?

## 2013-07-10 NOTE — Telephone Encounter (Signed)
Have called the pt, left message

## 2013-07-10 NOTE — Telephone Encounter (Signed)
Called pharm, they state pt has not picked up any klor-con since Apr 14 2013 #60

## 2013-07-22 ENCOUNTER — Encounter: Payer: Medicare Other | Admitting: Internal Medicine

## 2013-07-22 ENCOUNTER — Encounter: Payer: Self-pay | Admitting: Internal Medicine

## 2013-07-23 ENCOUNTER — Telehealth: Payer: Self-pay | Admitting: *Deleted

## 2013-07-23 NOTE — Telephone Encounter (Signed)
Call from pt - requesting to re-schedule  Virtual Colonoscopy.  [facility] states need new order, since pt had canceled several time they canceled the order. Need another order for CT Virtual Colonoscopy. Thanks

## 2013-07-24 NOTE — Telephone Encounter (Signed)
Since she has canceled so many times please ask her when she wishes to do so and then we will place order. We cant keep putting in referrals and they dont follow up

## 2013-07-24 NOTE — Telephone Encounter (Signed)
When pt called yesterday, she stated she's ready to have it done now.

## 2013-07-24 NOTE — Telephone Encounter (Signed)
Not sure how to enter this, lease verbal me on the order?

## 2013-07-25 ENCOUNTER — Other Ambulatory Visit: Payer: Self-pay | Admitting: Internal Medicine

## 2013-07-25 DIAGNOSIS — Z1211 Encounter for screening for malignant neoplasm of colon: Secondary | ICD-10-CM

## 2013-07-25 NOTE — Telephone Encounter (Signed)
Go to Meds/Order or Order Rella LarveEnrty; order CT Virtual Colon and make sure Procedures box is checked and choose Screening not Diagnostic.

## 2013-07-25 NOTE — Telephone Encounter (Signed)
Order in.

## 2013-07-27 ENCOUNTER — Other Ambulatory Visit: Payer: Self-pay | Admitting: Internal Medicine

## 2013-08-01 ENCOUNTER — Other Ambulatory Visit: Payer: Self-pay | Admitting: Internal Medicine

## 2013-08-06 NOTE — Telephone Encounter (Signed)
Appt scheduled 09/03/13 per EPIC.

## 2013-08-08 ENCOUNTER — Telehealth: Payer: Self-pay | Admitting: *Deleted

## 2013-08-08 NOTE — Telephone Encounter (Signed)
Call from pt - c/o cramping of legs; states it's like muscular pain; ?from statin medication. Please advise.  Thanks

## 2013-08-11 NOTE — Telephone Encounter (Signed)
Pt called;wants Dr Virgina OrganQureshi to call her @ [phone] after leg carmps.

## 2013-08-11 NOTE — Telephone Encounter (Signed)
I called Jill Hancock back today to discuss her leg cramps.  She says she has been having them off and on for the past two weeks up to her groin and is wondering if it is secondary to the pravastatin.  She has not had these complaints before and did not take her pravastatin yesterday and feels better today.  It is possible that this could be secondary to the statin, and thus I have asked her to stop the medication for now and [facility].  Her last CK level was mildly elevated to 186 in 08/2012, so we can also recheck that and investigate for any other cause of the cramping.  She reports having all her blood work done recently by her holistic care provider and she is asked to bring that in, incase of electrolyte abnormalities.   We will have the front desk call her for next available appointment.

## 2013-08-15 ENCOUNTER — Other Ambulatory Visit (HOSPITAL_COMMUNITY): Payer: Self-pay | Admitting: Internal Medicine

## 2013-08-19 ENCOUNTER — Encounter: Payer: Medicare Other | Admitting: Internal Medicine

## 2013-08-26 ENCOUNTER — Telehealth: Payer: Self-pay | Admitting: *Deleted

## 2013-08-26 ENCOUNTER — Encounter: Payer: Medicare Other | Admitting: Internal Medicine

## 2013-08-26 NOTE — Telephone Encounter (Signed)
I called and spoke with her this morning. We will stay off the statin for now until she can be rescheduled to be seen in the clinic for cramping complaints possibly secondary to the statin.  She will need ck levels rechecked, until then, she is advised to stay off the medication, but can discuss further when evaluated in opc.    Please reschedule her for later this week or the next or when she is available with any other opc resident. She cannot come today due to the weather.

## 2013-08-26 NOTE — Telephone Encounter (Signed)
Call from pt - wants to know if you are to put her on a different cholesterol med or back on Pravastatin but a lower dosage. She wants u to call her at home [phone]. Due to the weather, her appt today has been changed.

## 2013-08-29 NOTE — Telephone Encounter (Signed)
Pt informed of appt 09/03/13 with Dr Dorise HissKollar.

## 2013-09-03 ENCOUNTER — Inpatient Hospital Stay: Admission: RE | Admit: 2013-09-03 | Payer: Medicare Other | Source: Ambulatory Visit

## 2013-09-03 ENCOUNTER — Ambulatory Visit: Payer: Medicare Other | Admitting: Internal Medicine

## 2013-09-08 ENCOUNTER — Ambulatory Visit: Payer: Medicare Other | Admitting: Nurse Practitioner

## 2013-09-15 ENCOUNTER — Other Ambulatory Visit: Payer: Medicare Other

## 2013-09-17 ENCOUNTER — Ambulatory Visit: Payer: Medicare Other | Admitting: Nurse Practitioner

## 2013-09-20 ENCOUNTER — Other Ambulatory Visit (HOSPITAL_COMMUNITY): Payer: Self-pay | Admitting: Internal Medicine

## 2013-10-07 ENCOUNTER — Encounter: Payer: Self-pay | Admitting: Internal Medicine

## 2013-10-07 ENCOUNTER — Ambulatory Visit (HOSPITAL_COMMUNITY)
Admission: RE | Admit: 2013-10-07 | Discharge: 2013-10-07 | Disposition: A | Discharge status: Home / Self Care | Payer: Medicare Other | Source: Ambulatory Visit | Attending: Internal Medicine | Admitting: Internal Medicine

## 2013-10-07 ENCOUNTER — Other Ambulatory Visit: Payer: Self-pay | Admitting: Internal Medicine

## 2013-10-07 ENCOUNTER — Ambulatory Visit (INDEPENDENT_AMBULATORY_CARE_PROVIDER_SITE_OTHER): Payer: Medicare Other | Admitting: Internal Medicine

## 2013-10-07 VITALS — BP 131/79 | HR 68 | Temp 97.8°F | Ht 64.0 in | Wt 175.1 lb

## 2013-10-07 DIAGNOSIS — N390 Urinary tract infection, site not specified: Secondary | ICD-10-CM

## 2013-10-07 DIAGNOSIS — R609 Edema, unspecified: Secondary | ICD-10-CM

## 2013-10-07 DIAGNOSIS — R600 Localized edema: Secondary | ICD-10-CM

## 2013-10-07 DIAGNOSIS — IMO0001 Reserved for inherently not codable concepts without codable children: Secondary | ICD-10-CM

## 2013-10-07 DIAGNOSIS — I639 Cerebral infarction, unspecified: Secondary | ICD-10-CM

## 2013-10-07 DIAGNOSIS — T466X5A Adverse effect of antihyperlipidemic and antiarteriosclerotic drugs, initial encounter: Principal | ICD-10-CM

## 2013-10-07 DIAGNOSIS — I10 Essential (primary) hypertension: Secondary | ICD-10-CM

## 2013-10-07 DIAGNOSIS — M609 Myositis, unspecified: Secondary | ICD-10-CM | POA: Insufficient documentation

## 2013-10-07 DIAGNOSIS — I6350 Cerebral infarction due to unspecified occlusion or stenosis of unspecified cerebral artery: Secondary | ICD-10-CM

## 2013-10-07 LAB — URINALYSIS, ROUTINE W REFLEX MICROSCOPIC
Bilirubin Urine: NEGATIVE
Glucose, UA: NEGATIVE mg/dL
HGB URINE DIPSTICK: NEGATIVE
NITRITE: NEGATIVE
PH: 6.5 (ref 5.0–8.0)
Protein, ur: NEGATIVE mg/dL
SPECIFIC GRAVITY, URINE: 1.019 (ref 1.005–1.030)
Urobilinogen, UA: 0.2 mg/dL (ref 0.0–1.0)

## 2013-10-07 LAB — URINALYSIS, MICROSCOPIC ONLY
Casts: NONE SEEN
Crystals: NONE SEEN
RBC / HPF: NONE SEEN RBC/hpf (ref <3)

## 2013-10-07 MED ORDER — CIPROFLOXACIN HCL 250 MG PO TABS: 250.0000 mg | ORAL_TABLET | Freq: Two times a day (BID) | ORAL | Status: AC

## 2013-10-07 NOTE — Progress Notes (Signed)
Subjective:   Patient ID: Jill Hancock female   DOB: 17-Nov-1940 73 y.o.   MRN: [id number]  HPI: Jill Hancock Born is a 73 y.o. white female with PMH of HTN, vertigo, Hancock/p impacted L humerus surgical neck fracture, and CVA presenting to opc today for leg cramps for at least 1 month.  She endorses intermittent leg cramping, more notable on her upper legs and occasionally has pain in right groin region when getting up in a truck.  The cramps were noticed more at night but have decreased since stopping here statin in February. The cramps last a few seconds and then resolve she claims.  She also endorses continued leg swelling, mainly left ankle that she claims is secondary to the amlodipine. She has no pain when walking, however, does endorse some palpation on the back of her leg on physical exam. She has not been exercising very much and not walking on the treadmill lately as well due to feeling low in energy at times.  She does take valium at night which could be contributing to her lethargy and she will try to stop taking that at night and see how it goes.  Her depression she claims has improved since on the prozac.   Of note, total CK in 08/2012 was 186.  She wishes to remain on her statin if at all possible due to her great response in LDL being at goal, but we will recheck CK to see if this is possible.   She was last seen in opc in 01/2013 and seen by neurology on 03/07/2013 where she was changed to ASA 81mg  qd and recommended for strict HTN control with goal BP <130/90 and LDL <100.  Repeat carotid dopplers done during that time was notable for abnormally elevated BFV in RECA due to stenotic narrowing and moderate wall thickening in L carotid bulb.  She was to follow up in March 2015, but has follow up scheduled for June 2015. She also has her virtual colonoscopy scheduled for that month as well.    She brings in labs done on 07/30/13 from outside facility ([facility], ordered by Alveda ReasonsL Brown) and ordered by outside provider with HbA1C of 6, low vitamin D 25-OH 25.7.  Lipid panel notable for LDL 76, Chol 158, HDL 58, and TG 121. CRP 0.54. Low DHEA 15.4 and high insulin 32. No leukocytosis (8.7) and Hb 14.2.  She claims to get vitamin d supplementation and dhea from her other provider.    Finally, Jill Hancock endorses dysuria and frequency. She says she had a UTI in January for which she was treated in walk in clinic initially with bactrim with no success then treated with cipro with success.  Of note, she does have a hx of Ecoli urine culture in 2013 resistant to cipro. She has been treated with macrobid in the past that she says she does not tolerate.   Past Medical History  Diagnosis Date  . Hypertension   . Vertigo   . Stroke 02/2012    MRI revealed at least 3 subcentimeter acute infarctions with one area of subacute infarction in widely disparate vascular territories including territory of left cerebellum and around right caudate nucleus along with widespread lacunar infarcts, chronic microvascular ischemic change, and numerous microbleeds suggesting long standing hypertensive cerebrovascular disease.  MRA confirmed intracranial  . Hyperglycemia   . Multiple cerebral infarctions august 2013  . Unsteady gait   . Hypokalemia    Current  Outpatient Prescriptions  Medication Sig Dispense Refill  . amLODipine (NORVASC) 5 MG tablet TAKE 1 TABLET EVERY DAY  30 tablet  11  . aspirin 81 MG tablet Take 1 tablet (81 mg total) by mouth daily.      . diazepam (VALIUM) 2 MG tablet Take 0.5 tablets (1 mg total) by mouth at bedtime as needed for anxiety. For dizziness.  30 tablet  0  . diclofenac sodium (VOLTAREN) 1 % GEL Apply 2 g topically 4 (four) times daily.  1 Tube  1  . esomeprazole (NEXIUM) 20 MG capsule Take 20 mg by mouth daily before breakfast.      . lisinopril (PRINIVIL,ZESTRIL) 40 MG tablet TAKE 1 TABLET BY MOUTH EVERY DAY  30 tablet  5  . meclizine  (ANTIVERT) 25 MG tablet Take 1 tablet (25 mg total) by mouth 3 (three) times daily as needed for dizziness.  90 tablet  1  . potassium chloride (KLOR-CON M10) 10 MEQ tablet Take 1 tablet (10 mEq total) by mouth daily.  90 tablet  3  . alendronate (FOSAMAX) 70 MG tablet Take 1 tablet (70 mg total) by mouth every 7 (seven) days. Take with full glass of water on empty stomach  12 tablet  5  . Calcium Carbonate-Vitamin D 600-400 MG-UNIT per chew tablet Chew 2 tablets by mouth daily.  60 tablet  5  . furosemide (LASIX) 20 MG tablet Take 1 tablet (20 mg total) by mouth daily as needed (for leg swelling).  30 tablet  11  . pravastatin (PRAVACHOL) 20 MG tablet Take 1 tablet (20 mg total) by mouth every morning.  30 tablet  11   No current facility-administered medications for this visit.   Family History  Problem Relation Age of Onset  . Heart attack Brother     death at age 39  . Stroke Brother     death at 46  . Heart attack Mother    History   Social History  . Marital Status: Unknown    Spouse Name: N/A    Number of Children: 5  . Years of Education: N/A   Occupational History  . RN    Social History Main Topics  . Smoking status: Never Smoker   . Smokeless tobacco: None  . Alcohol Use: 0.6 oz/week    1 Glasses of wine per week     Comment: occasionally  . Drug Use: No  . Sexual Activity: None   Other Topics Concern  . None   Social History Narrative  . None   Review of Systems:  Constitutional:  Fatigue. Denies fever, chills, diaphoresis, appetite change.  HEENT:  Denies congestion, sore throat, rhinorrhea  Respiratory:  Denies SOB, DOE, cough, and wheezing.   Cardiovascular:  Denies chest pain, palpitations, and leg swelling.   Gastrointestinal:  Denies nausea, vomiting, abdominal pain   Genitourinary:  Dysuria and frequency.    Musculoskeletal:  Leg cramping, right groin discomfort, left leg/ankle edema.    Skin:  Denies pallor, rash and wound.   Neurological:   Occasional dizziness and "shakiness". Denies seizures, syncope   Objective:  Physical Exam: Filed Vitals:   10/07/13 1351  BP: 131/79  Pulse: 68  Temp: 97.8 F (36.6 C)  TempSrc: Oral  Height: 5\' 4"  (1.626 m)  Weight: 175 lb 1.6 oz (79.425 kg)  SpO2: 97%   Vitals reviewed. General: sitting in chair, NAD HEENT: EOMI Cardiac: RRR Pulm: clear to auscultation bilaterally, no wheezes, rales, or rhonchi Abd: soft,  nontender, nondistended, BS present Ext: warm and well perfused, +1 left lower leg pitting edema, mild tenderness to palpation behind left knee, mild left ankle edema, no edema RLE, no erythema or warmth, +2DP B/L Neuro: alert and oriented X3, cranial nerves II-XII grossly intact, strength and sensation to light touch equal in bilateral upper and lower extremities  Assessment & Plan:  Discussed with Dr. Kem Kays ?statin induced myopathy--checking ck, holding statin for now UTI--hx of cipro resistance but reports success in January, will empirically treat with cipro for now, u/a, urine culture pending ?DVT--dopplers pending

## 2013-10-07 NOTE — Patient Instructions (Addendum)
Please have your lower leg doppler study done  We will call you with blood results and how that may change your cholesterol medicine if any  You can continue your amlodipine and lisinopril for your blood pressure for now  Try to walk and exercise more if possible  Try to avoid valium use at night to see if that helps with your energy  Please follow up with your wellness provider in regards to your vitamin d as you have been getting the supplementation from her  Please follow up with neurology and your colonoscopy as planned  Consider starting your bisphosphonate therapy for your bones  We have prescribed ciprofloxacin twice a day for 3 days total for your suspected urine infection, pending your lab results, i will call you to start or not.   Treatment Goals:  Goals (1 Years of Data) as of 10/07/13         As of Today 03/07/13 02/25/13 12/14/12 12/14/12     Blood Pressure    . Blood Pressure < 140/90  131/79 133/77 128/80 135/87 164/84      Progress Toward Treatment Goals:  Treatment Goal 10/07/2013  Blood pressure at goal  Prevent falls at goal    Self Care Goals & Plans:  Self Care Goal 10/07/2013  Manage my medications take my medicines as prescribed; bring my medications to every visit; refill my medications on time  Monitor my health keep track of my blood pressure  Eat healthy foods eat more vegetables; eat baked foods instead of fried foods; eat foods that are low in salt  Be physically active take a walk every day  Meeting treatment goals -    No flowsheet data found.   Care Management & Community Referrals:  Referral 11/19/2012  Referrals made for care management support none needed  Referrals made to community resources none

## 2013-10-07 NOTE — Assessment & Plan Note (Signed)
Now on ASA 81mg  and was on statin prior to complaints of cramps.  LDL and BP at goal.   Has f/u appointment with neurology in June.

## 2013-10-07 NOTE — Assessment & Plan Note (Signed)
Mild pitting edema LLL with mild pain in lower extremity. Attributes swelling to amlodipine but unilateral presentation and pain concerning for possible DVT. Could be chronic venous insufficiency and she does endorse improvement with elevating her legs.  Edema could be secondary to amlodipine, however, would expected more bilateral distribution.  She has also not been taking lasix for several months.  -LLL venous doppler

## 2013-10-07 NOTE — Progress Notes (Signed)
Left lower extremity venous duplex completed.  Left:  No evidence of DVT, superficial thrombosis, or Baker's cyst.  Right:  Negative for DVT in the common femoral vein.  

## 2013-10-07 NOTE — Assessment & Plan Note (Signed)
BP Readings from Last 3 Encounters:  10/07/13 131/79  03/07/13 133/77  09/04/12 135/80   Lab Results  Component Value Date   NA 143 09/17/2012   K 4.0 09/17/2012   CREATININE 0.79 09/17/2012   Assessment: Blood pressure control: controlled Progress toward BP goal:  At goal  Plan: Medications:  Continue current medications, norvasc 5 and lisinopril 50 Educational resources provided: handout

## 2013-10-07 NOTE — Assessment & Plan Note (Signed)
Reports dysuria and frequency.  Last UTI noted in January 2015 treated in walk in clinic. Initially given bactrim then changed to cipro with success. Does not tolerate macrobid. Hx of ecoli uti with resistance to cipro but patient claims that is what works best and would like to try that.   -u/a, urine cx, u/a shows large leukocytes with negative nitrite. Will proceed with cipro treatment at this time x3 days.  -reviewed proper hygiene methods which patient says she does for prevention.

## 2013-10-08 LAB — CK: Total CK: 192 U/L — ABNORMAL HIGH (ref 7–177)

## 2013-10-08 NOTE — Progress Notes (Signed)
Case discussed with Dr. Qureshi at time of visit.  We reviewed the resident's history and exam and pertinent patient test results.  I agree with the assessment, diagnosis, and plan of care documented in the resident's note. 

## 2013-10-09 ENCOUNTER — Telehealth: Payer: Self-pay | Admitting: *Deleted

## 2013-10-09 NOTE — Telephone Encounter (Signed)
I will call her with discussion for statin

## 2013-10-09 NOTE — Telephone Encounter (Signed)
Call to pt - informed to start taking Cipro per Dr Virgina OrganQureshi and still waitong on cx results. Pt asking if she can re-start Pravastatin?

## 2013-10-09 NOTE — Telephone Encounter (Signed)
Message copied by Hassan BucklerPALMER, GLENDA H on Thu Oct 09, 2013  9:34 AM ------      Message from: Baltazar ApoQURESHI, SAMAYA J      Created: Tue Oct 07, 2013  9:04 PM      Regarding: call pt please, tx UTI       Dear Rivka Barbaraglenda or gladys,            Please call Ms. Tones and let her know she may start treatment with ciprofloxacin since her urinalysis has large amount of leukocytes.  We are still awaiting culture results.  Please also review side-effects of the medication. We reviewed briefly in clinic as well.             Thanks,            Dr q ------

## 2013-10-10 LAB — URINE CULTURE: Colony Count: >=100000

## 2013-10-12 ENCOUNTER — Telehealth: Payer: Self-pay | Admitting: Internal Medicine

## 2013-10-12 NOTE — Telephone Encounter (Signed)
I called Jill Hancock to review results of her CK and urine culture. No answer on both telephone numbers and messages left.

## 2013-10-13 ENCOUNTER — Telehealth: Payer: Self-pay | Admitting: *Deleted

## 2013-10-13 DIAGNOSIS — N390 Urinary tract infection, site not specified: Secondary | ICD-10-CM

## 2013-10-13 DIAGNOSIS — I639 Cerebral infarction, unspecified: Secondary | ICD-10-CM

## 2013-10-13 MED ORDER — PRAVASTATIN SODIUM 20 MG PO TABS: ORAL_TABLET | ORAL | Status: DC

## 2013-10-13 MED ORDER — NITROFURANTOIN MONOHYD MACRO 100 MG PO CAPS: 100.0000 mg | ORAL_CAPSULE | Freq: Two times a day (BID) | ORAL | Status: AC

## 2013-10-13 NOTE — Telephone Encounter (Signed)
Call from pt - returning Dr Waynard ReedsQureshi's call. Will let her know.

## 2013-10-13 NOTE — Telephone Encounter (Signed)
I called Ms. Macias back this morning in regards to her urine culture. She is resistant to fluroquinolones and still has symptoms and is also resistant to bactrim per urine culture. PNC allergy with rash makes cephalosporins not a good option. Best option appears to be macrobid which she has taken in the past but had some nausea with it. It is listed as allergy due to her nausea but she is willing to take it and says nausea can be controlled with phenergan as well if needed. Will watch closely but will go ahead and treat with macrobid 100mg  by mouth bid x7 days.   Also after discussion about her CK and the statin, she wishes to keep using pravastatin but we will change to every other day and monitor closely for now.   Signed: Darden PalmerSamaya Justus Duerr, MD PGY-2, Internal Medicine Resident Pager: [phone]  10/13/2013,9:01 AM

## 2013-10-15 ENCOUNTER — Other Ambulatory Visit: Payer: Self-pay | Admitting: Internal Medicine

## 2013-10-17 ENCOUNTER — Other Ambulatory Visit: Payer: Self-pay | Admitting: *Deleted

## 2013-10-17 MED ORDER — MECLIZINE HCL 25 MG PO TABS: 25.0000 mg | ORAL_TABLET | Freq: Three times a day (TID) | ORAL | Status: DC | PRN

## 2013-11-25 ENCOUNTER — Other Ambulatory Visit: Payer: Self-pay | Admitting: *Deleted

## 2013-11-25 MED ORDER — DIAZEPAM 2 MG PO TABS: 1.0000 mg | ORAL_TABLET | Freq: Every evening | ORAL | Status: DC | PRN

## 2013-11-25 NOTE — Telephone Encounter (Signed)
rx called in

## 2013-11-25 NOTE — Telephone Encounter (Signed)
Last refill 06/30/13

## 2013-12-02 ENCOUNTER — Other Ambulatory Visit: Payer: Medicare Other

## 2013-12-03 ENCOUNTER — Other Ambulatory Visit: Payer: Self-pay | Admitting: Internal Medicine

## 2013-12-26 ENCOUNTER — Telehealth: Payer: Self-pay | Admitting: *Deleted

## 2013-12-26 NOTE — Telephone Encounter (Signed)
I tried calling her back, no answer. Left a voice mail. I recommend for her to get evaluated prior to treating. Straining to void has not classically been a symptom of her's with UTI's. Over the weekend or today she can go to urgent care to get evaluated if symptoms are persisting.

## 2013-12-26 NOTE — Telephone Encounter (Signed)
Return pt's call - states she's having lower abd pain - ? bladder; she has to strain to void. She took 1 Macrobid tab (which she had previously) and 1 AZO tablet. Request med to be called to her pharmacy. [place] if she have to but she prefers to be treated first. Thanks

## 2013-12-29 ENCOUNTER — Encounter: Payer: Self-pay | Admitting: Internal Medicine

## 2013-12-29 ENCOUNTER — Ambulatory Visit (INDEPENDENT_AMBULATORY_CARE_PROVIDER_SITE_OTHER): Payer: Medicare Other | Admitting: Internal Medicine

## 2013-12-29 VITALS — BP 114/70 | HR 70 | Temp 97.4°F | Ht 65.0 in | Wt 177.0 lb

## 2013-12-29 DIAGNOSIS — N390 Urinary tract infection, site not specified: Secondary | ICD-10-CM

## 2013-12-29 DIAGNOSIS — Z0000 Encounter for general adult medical examination without abnormal findings: Secondary | ICD-10-CM

## 2013-12-29 DIAGNOSIS — N3001 Acute cystitis with hematuria: Secondary | ICD-10-CM

## 2013-12-29 DIAGNOSIS — R300 Dysuria: Secondary | ICD-10-CM

## 2013-12-29 LAB — POCT URINALYSIS DIPSTICK
Bilirubin, UA: NEGATIVE
Glucose, UA: NEGATIVE
Ketones, UA: NEGATIVE
Nitrite, UA: NEGATIVE
PROTEIN UA: NEGATIVE
SPEC GRAV UA: 1.01
UROBILINOGEN UA: 0.2
pH, UA: 5.5

## 2013-12-29 LAB — HEMOGLOBIN A1C
Hgb A1c MFr Bld: 6.2 % — ABNORMAL HIGH (ref <5.7)
Mean Plasma Glucose: 131 mg/dL — ABNORMAL HIGH (ref <117)

## 2013-12-29 MED ORDER — FOSFOMYCIN TROMETHAMINE 3 G PO PACK: 3.0000 g | PACK | Freq: Once | ORAL | Status: DC

## 2013-12-29 NOTE — Progress Notes (Signed)
Subjective:    Patient ID: Georgiann CockerLinda S Dec, female    DOB: [birth year], 73 y.o.   MRN: [id number]  HPI Ms. Pigeon is a 73 yo woman pmh as listed below presents acutely for suspected UTI.   Pt complains of urinary frequency, urgency and dysuria x 3 days, without flank pain, fever, chills, or abnormal vaginal discharge or bleeding. Per chart review pt has had grown out E. Coli resistant to amipicillin, cipro, and bactrim several times. It is unclear whether the patient is colonized with this bug. Pt feels this is similar to her previous UTI. No other vaginal discharge, new sexual partners, or pelvic pain.   Past Medical History  Diagnosis Date  . Hypertension   . Vertigo   . Stroke 02/2012    MRI revealed at least 3 subcentimeter acute infarctions with one area of subacute infarction in widely disparate vascular territories including territory of left cerebellum and around right caudate nucleus along with widespread lacunar infarcts, chronic microvascular ischemic change, and numerous microbleeds suggesting long standing hypertensive cerebrovascular disease.  MRA confirmed intracranial  . Hyperglycemia   . Multiple cerebral infarctions august 2013  . Unsteady gait   . Hypokalemia    Current Outpatient Prescriptions on File Prior to Visit  Medication Sig Dispense Refill  . alendronate (FOSAMAX) 70 MG tablet Take 1 tablet (70 mg total) by mouth every 7 (seven) days. Take with full glass of water on empty stomach  12 tablet  5  . amLODipine (NORVASC) 5 MG tablet TAKE 1 TABLET EVERY DAY  30 tablet  11  . aspirin 81 MG tablet Take 1 tablet (81 mg total) by mouth daily.      . Calcium Carbonate-Vitamin D 600-400 MG-UNIT per chew tablet Chew 2 tablets by mouth daily.  60 tablet  5  . diazepam (VALIUM) 2 MG tablet Take 0.5 tablets (1 mg total) by mouth at bedtime as needed for anxiety. For dizziness.  30 tablet  0  . diclofenac sodium (VOLTAREN) 1 % GEL Apply 2 g topically 4 (four) times daily.   1 Tube  1  . esomeprazole (NEXIUM) 20 MG capsule Take 20 mg by mouth daily before breakfast.      . FLUoxetine (PROZAC) 20 MG capsule TAKE ONE CAPSULE BY MOUTH EVERY DAY  30 capsule  3  . furosemide (LASIX) 20 MG tablet Take 1 tablet (20 mg total) by mouth daily as needed (for leg swelling).  30 tablet  11  . lisinopril (PRINIVIL,ZESTRIL) 40 MG tablet TAKE 1 TABLET BY MOUTH EVERY DAY  30 tablet  5  . meclizine (ANTIVERT) 25 MG tablet Take 1 tablet (25 mg total) by mouth 3 (three) times daily as needed for dizziness.  90 tablet  0  . potassium chloride (KLOR-CON M10) 10 MEQ tablet Take 1 tablet (10 mEq total) by mouth daily.  90 tablet  3  . pravastatin (PRAVACHOL) 20 MG tablet Take 20mg  every other day  30 tablet  11  . pravastatin (PRAVACHOL) 20 MG tablet Take 1 tablet (20 mg total) by mouth every other day.  30 tablet  6   No current facility-administered medications on file prior to visit.   Social, surgical, family history reviewed with patient and updated in appropriate chart locations.   Review of Systems 10pt complete Review of Systems performed - Negative except as listed in HPI     Objective:   Physical Exam Filed Vitals:   12/29/13 1456  BP: 114/70  Pulse: 70  Temp: 97.4 F (36.3 C)   General: sitting in chair, NAD HEENT: PERRL, EOMI, no scleral icterus Cardiac: RRR, no rubs, murmurs or gallops Pulm: clear to auscultation bilaterally, moving normal volumes of air Abd: soft, nontender, nondistended, BS present, no CVA tenderness Ext: warm and well perfused, no pedal edema, some lumbar paraspinal muscle tenderness and tightness Neuro: alert and oriented X3, cranial nerves II-XII grossly intact    Assessment & Plan:  Please see problem oriented charting  Pt discussed with Dr. Heide SparkNarendra

## 2013-12-29 NOTE — Assessment & Plan Note (Signed)
Pt most likely has repeat infection with E. Coli.   Today Urine dip has leuk and trace blood - nitrite. No signs of pyelonephritis.  -Will follow resistance pattern and prescribe fosfomycin 3g x once. -UA, UCx will update patient if different organism grows out

## 2013-12-29 NOTE — Telephone Encounter (Signed)
Pt called this morning requesting an appt;no appt available w/Dr Virgina OrganQureshi. An app[facility] was [facility] front [facility] for [facility].

## 2013-12-29 NOTE — Patient Instructions (Addendum)
General Instructions:   Please bring your medicines with you each time you come to clinic.  Medicines may include prescription medications, over-the-counter medications, herbal remedies, eye drops, vitamins, or other pills.   Progress Toward Treatment Goals:  Treatment Goal 10/07/2013  Blood pressure at goal  Prevent falls at goal    Self Care Goals & Plans:  Self Care Goal 12/29/2013  Manage my medications take my medicines as prescribed; bring my medications to every visit; refill my medications on time  Monitor my health -  Eat healthy foods drink diet soda or water instead of juice or soda; eat more vegetables; eat foods that are low in salt; eat baked foods instead of fried foods; eat fruit for snacks and desserts  Be physically active -  Meeting treatment goals -    No flowsheet data found.   Care Management & Community Referrals:  Referral 11/19/2012  Referrals made for care management support none needed  Referrals made to community resources none       Back Pain, Adult Back pain is very common. The pain often gets better over time. The cause of back pain is usually not dangerous. Most people can learn to manage their back pain on their own.  HOME CARE   Stay active. Start with short walks on flat ground if you can. Try to walk farther each day.  Do not sit, drive, or stand in one place for more than 30 minutes. Do not stay in bed.  Do not avoid exercise or work. Activity can help your back heal faster.  Be careful when you bend or lift an object. Bend at your knees, keep the object close to you, and do not twist.  Sleep on a firm mattress. Lie on your side, and bend your knees. If you lie on your back, put a pillow under your knees.  Only take medicines as told by your doctor.  Put ice on the injured area.  Put ice in a plastic bag.  Place a towel between your skin and the bag.  Leave the ice on for 15-20 minutes, 03-04 times a day for the first 2 to 3  days. After that, you can switch between ice and heat packs.  Ask your doctor about back exercises or massage.  Avoid feeling anxious or stressed. Find good ways to deal with stress, such as exercise. GET HELP RIGHT AWAY IF:   Your pain does not go away with rest or medicine.  Your pain does not go away in 1 week.  You have new problems.  You do not feel well.  The pain spreads into your legs.  You cannot control when you poop (bowel movement) or pee (urinate).  Your arms or legs feel weak or lose feeling (numbness).  You feel sick to your stomach (nauseous) or throw up (vomit).  You have belly (abdominal) pain.  You feel like you may pass out (faint). MAKE SURE YOU:   Understand these instructions.  Will watch your condition.  Will get help right away if you are not doing well or get worse. Document Released: 12/06/2007 Document Revised: 09/11/2011 Document Reviewed: 11/07/2010 [facility] Patient Information 2015 [employer] [place]. This information is not intended to replace advice given to you by your health care Abryanna Musolino. Make sure you discuss any questions you have with your health care Shariff Lasky.

## 2013-12-30 ENCOUNTER — Telehealth: Payer: Self-pay | Admitting: Nurse Practitioner

## 2013-12-30 ENCOUNTER — Telehealth: Payer: Self-pay | Admitting: *Deleted

## 2013-12-30 ENCOUNTER — Ambulatory Visit: Payer: Medicare Other | Admitting: Nurse Practitioner

## 2013-12-30 LAB — URINALYSIS, COMPLETE
BILIRUBIN URINE: NEGATIVE
Bacteria, UA: NONE SEEN
Casts: NONE SEEN
Crystals: NONE SEEN
GLUCOSE, UA: NEGATIVE mg/dL
HGB URINE DIPSTICK: NEGATIVE
Ketones, ur: NEGATIVE mg/dL
Nitrite: POSITIVE — AB
PROTEIN: NEGATIVE mg/dL
Specific Gravity, Urine: 1.006 (ref 1.005–1.030)
Urobilinogen, UA: 0.2 mg/dL (ref 0.0–1.0)
pH: 5.5 (ref 5.0–8.0)

## 2013-12-30 LAB — LIPID PANEL
Cholesterol: 159 mg/dL (ref 0–200)
HDL: 47 mg/dL (ref >39)
LDL CALC: 85 mg/dL (ref 0–99)
Total CHOL/HDL Ratio: 3.4 Ratio
Triglycerides: 135 mg/dL (ref <150)
VLDL: 27 mg/dL (ref 0–40)

## 2013-12-30 MED ORDER — NITROFURANTOIN MONOHYD MACRO 100 MG PO CAPS: 100.0000 mg | ORAL_CAPSULE | Freq: Two times a day (BID) | ORAL | Status: DC

## 2013-12-30 NOTE — Telephone Encounter (Signed)
Patient cancelled same day as office appointment.

## 2013-12-30 NOTE — Telephone Encounter (Signed)
Call from pt - Pt states antibiotic (Fosfomycin) ordered yesterday; co-pay is $71.00, too expensive. Can u order something else? Thanks

## 2013-12-30 NOTE — Telephone Encounter (Signed)
Unfortunately there are not any other options as the patient has a presumed resistant bug to other Abx choices, pt refused to take Nitrofurantoin, and the patient has allergies to PCN that cause rash therefore would be unsafe to give cephalosporin. Thanks.

## 2013-12-30 NOTE — Progress Notes (Signed)
INTERNAL MEDICINE TEACHING ATTENDING ADDENDUM - Nischal Narendra, MD: I reviewed and discussed with the resident Dr. Sadek, the patient's medical history, physical examination, diagnosis and results of pertinent tests and treatment and I agree with the patient's care as documented. 

## 2013-12-30 NOTE — Telephone Encounter (Signed)
Pt states since she cannot afford Fosfomycin, she wants rx for Nitrofurantoin instead; even though "it makes me sick on my stomach". Thanks

## 2013-12-30 NOTE — Telephone Encounter (Signed)
Pt called/informed of Nitrofurantoin rx.

## 2013-12-31 LAB — URINE CULTURE

## 2014-01-06 ENCOUNTER — Telehealth: Payer: Self-pay | Admitting: *Deleted

## 2014-01-06 NOTE — Telephone Encounter (Signed)
Returned pt's call - pt requesting result of Lipid/ UA and Cx; stated she was unable to see in My Chart. Results given to pt.

## 2014-01-30 ENCOUNTER — Other Ambulatory Visit: Payer: Medicare Other

## 2014-02-04 ENCOUNTER — Ambulatory Visit: Payer: Medicare Other | Admitting: Nurse Practitioner

## 2014-02-20 ENCOUNTER — Telehealth: Payer: Self-pay | Admitting: *Deleted

## 2014-02-20 DIAGNOSIS — N390 Urinary tract infection, site not specified: Secondary | ICD-10-CM

## 2014-02-20 NOTE — Telephone Encounter (Signed)
Returned pt's call - requesting an urology referral; having difficulty "holding" her urine and has an "awful" smell. States she had seen an urologist in the past in [place]. After talking w/her, I looked up his name b/c might need records since she wanted to see someone here in [place]. After finding Dr Heywood FootmanStoioff's telephone# , she decided to try him again. Told her if she need a referral order to let us know. She agreed.

## 2014-02-20 NOTE — Telephone Encounter (Signed)
Pt states Dr Heywood FootmanStoioff's office will not sse her until 03/03/14. I told her I have no idea when [facility] would be able to see her but first I will need a referral. States to go ahead and request a referral order from Dr Virgina OrganQureshi.

## 2014-02-20 NOTE — Telephone Encounter (Signed)
Can we please get records from Dr. Heywood FootmanStoioff's office first so I know exactly what to refer for?  Thanks,  Dr q

## 2014-02-20 NOTE — Telephone Encounter (Signed)
[facility] does not have any records for the pt; so I called [facility] where Dr Lonna CobbStoioff previously worked and they do not have any records.   I called the pt and she said she haven't seen him in many years. So, iI suggested she might have to make an appt prior to referral; she said to let her know what the doctor decides. Thanks

## 2014-02-21 NOTE — Telephone Encounter (Signed)
I have placed the referral for urology at this time based on her frequent UTIs so we can at least try to get that process going. The incontinence seems like a new issue and she may have another UTI. Thus, she should be seen in opc hopefully this week. Please let her know and have appt scheduled. [facility] so she will need to see a different provider but she has been okay with that in the past.   Thanks,  Dr q

## 2014-02-21 NOTE — Addendum Note (Signed)
Addended by: Baltazar Apo on: 02/21/2014 04:39 PM   Modules accepted: Orders

## 2014-02-27 NOTE — Telephone Encounter (Signed)
I have tried several times/several days to get in contact w/pt. Her telephone [phone], which she always calls me on, is now not in service and left messages on the other # ([phone]) and no return call. Mamie is working on the Urology referral.

## 2014-03-02 ENCOUNTER — Ambulatory Visit: Payer: Medicare Other | Admitting: Nurse Practitioner

## 2014-03-06 ENCOUNTER — Ambulatory Visit: Payer: Self-pay | Admitting: Nurse Practitioner

## 2014-03-17 ENCOUNTER — Other Ambulatory Visit: Payer: Self-pay | Admitting: Internal Medicine

## 2014-03-20 ENCOUNTER — Other Ambulatory Visit: Payer: Self-pay | Admitting: Internal Medicine

## 2014-03-20 ENCOUNTER — Ambulatory Visit: Payer: Self-pay | Admitting: Nurse Practitioner

## 2014-03-20 NOTE — Telephone Encounter (Signed)
Rx called in 

## 2014-03-31 ENCOUNTER — Telehealth: Payer: Self-pay | Admitting: Nurse Practitioner

## 2014-03-31 ENCOUNTER — Ambulatory Visit: Payer: Self-pay | Admitting: Nurse Practitioner

## 2014-03-31 NOTE — Telephone Encounter (Signed)
Patient was no show for today's office appointment.  

## 2014-04-02 ENCOUNTER — Other Ambulatory Visit: Payer: Self-pay | Admitting: *Deleted

## 2014-04-02 DIAGNOSIS — M25551 Pain in right hip: Secondary | ICD-10-CM

## 2014-04-02 NOTE — Telephone Encounter (Signed)
Call from pt - states she's having burning w/urination; informed pt she might need to have an appt but states she felled Saturday night, hurt her back and unable to come in at this time which is why she's  Requesting a refill on Voltaren gel also. Thanks

## 2014-04-03 ENCOUNTER — Telehealth: Payer: Self-pay | Admitting: Internal Medicine

## 2014-04-03 MED ORDER — DICLOFENAC SODIUM 1 % TD GEL: 2.0000 g | Freq: Four times a day (QID) | TRANSDERMAL | Status: DC

## 2014-04-03 NOTE — Telephone Encounter (Signed)
Called pt , no answer -  Left message Macrobid not refilled d/t allergy per chart;if she saw Urologist , she need to call their office if she continue to have UTI symptoms; also informed her Voltaren gel rx has been filled. Also left message to call the office if she needs  to schedule an appt.

## 2014-04-03 NOTE — Telephone Encounter (Signed)
I tried calling Jill Hancock in regards to her recent call for possible UTI and left a message for her to call back the office.  I did not fill the macrobid requested prescription as it is listed as an allergy in the chart.  Additionally, she was treated in June with fosfomycin. She has also been referred to urology and looks like maybe appointment was on 9/8. Unclear if she has gone or now.   She will likely need an appointment if symptoms persist or touch base.   I will also have RN check with urology office for any visit notes that might explain frequent UTIs and review their recommendations.   voltaren gel was refilled.

## 2014-04-24 ENCOUNTER — Telehealth: Payer: Self-pay | Admitting: *Deleted

## 2014-04-24 ENCOUNTER — Other Ambulatory Visit: Payer: Self-pay | Admitting: Oncology

## 2014-04-24 MED ORDER — VALACYCLOVIR HCL 500 MG PO TABS: 500.0000 mg | ORAL_TABLET | Freq: Three times a day (TID) | ORAL | Status: AC

## 2014-04-24 NOTE — Telephone Encounter (Signed)
She needs to have the rash evaluated before prescribing medication. Please have her seen as soon as possible.

## 2014-04-24 NOTE — Telephone Encounter (Signed)
Call from pt - requesting refill on Zovirax cream from previous shingles outbreak; states she has a rash "under the skin" that's itching, red, lower lumbar/spine area. Thanks

## 2014-04-24 NOTE — Addendum Note (Signed)
Addended by: Neomia DearPOWERS, Christon Parada E on: 04/24/2014 05:26 PM   Modules accepted: Orders

## 2014-04-24 NOTE — Telephone Encounter (Signed)
Dr Cyndie ChimeGranfortuna has sent in Valtrex rx to the pharmacy; pt called and made awared.

## 2014-04-24 NOTE — Telephone Encounter (Signed)
I do not advise treating a rash without being able to see it. She will need to try to come in to either opc or urgent care. Will also let Dr. Reece AgarG know as he is in clinic today and I will be out

## 2014-04-24 NOTE — Telephone Encounter (Signed)
Pt states she is unable to schedule and come to the clinic at this time d/t her back.

## 2014-04-28 ENCOUNTER — Encounter: Payer: Self-pay | Admitting: Internal Medicine

## 2014-04-28 DIAGNOSIS — S22080A Wedge compression fracture of T11-T12 vertebra, initial encounter for closed fracture: Secondary | ICD-10-CM | POA: Insufficient documentation

## 2014-04-29 ENCOUNTER — Other Ambulatory Visit: Payer: Medicare Other

## 2014-05-07 ENCOUNTER — Telehealth: Payer: Self-pay | Admitting: *Deleted

## 2014-05-07 NOTE — Telephone Encounter (Signed)
I cannot prescribe without seeing her or she needs to be seen by the dentist at this time. Please let her know.

## 2014-05-07 NOTE — Telephone Encounter (Signed)
Call from pt - states she has an infected bottom tooth and is requesting a z-pack before she sees the dentist. States u have prescribed one before. Thanks

## 2014-05-08 NOTE — Telephone Encounter (Signed)
I called pt yesterday, no answer left message that she needs to be seen or needs to be seen by the dentist per Dr Virgina OrganQureshi. Called back today - no one at home.

## 2014-06-06 ENCOUNTER — Other Ambulatory Visit: Payer: Self-pay | Admitting: Internal Medicine

## 2014-06-23 ENCOUNTER — Encounter: Payer: Medicare Other | Admitting: Internal Medicine

## 2014-07-07 ENCOUNTER — Other Ambulatory Visit: Payer: Self-pay | Admitting: *Deleted

## 2014-07-07 MED ORDER — LISINOPRIL 40 MG PO TABS: 40.0000 mg | ORAL_TABLET | Freq: Every day | ORAL | Status: DC

## 2014-07-07 MED ORDER — AMLODIPINE BESYLATE 5 MG PO TABS: 5.0000 mg | ORAL_TABLET | Freq: Every day | ORAL | Status: DC

## 2014-07-07 MED ORDER — DIAZEPAM 2 MG PO TABS: 1.0000 mg | ORAL_TABLET | Freq: Every evening | ORAL | Status: DC | PRN

## 2014-07-07 MED ORDER — PRAVASTATIN SODIUM 20 MG PO TABS: 20.0000 mg | ORAL_TABLET | ORAL | Status: DC

## 2014-07-16 DIAGNOSIS — E28310 Symptomatic premature menopause: Secondary | ICD-10-CM | POA: Diagnosis not present

## 2014-07-16 DIAGNOSIS — E780 Pure hypercholesterolemia: Secondary | ICD-10-CM | POA: Diagnosis not present

## 2014-07-16 DIAGNOSIS — D510 Vitamin B12 deficiency anemia due to intrinsic factor deficiency: Secondary | ICD-10-CM | POA: Diagnosis not present

## 2014-07-16 DIAGNOSIS — R5382 Chronic fatigue, unspecified: Secondary | ICD-10-CM | POA: Diagnosis not present

## 2014-07-16 DIAGNOSIS — R799 Abnormal finding of blood chemistry, unspecified: Secondary | ICD-10-CM | POA: Diagnosis not present

## 2014-07-16 DIAGNOSIS — E559 Vitamin D deficiency, unspecified: Secondary | ICD-10-CM | POA: Diagnosis not present

## 2014-07-16 DIAGNOSIS — E039 Hypothyroidism, unspecified: Secondary | ICD-10-CM | POA: Diagnosis not present

## 2014-07-16 DIAGNOSIS — E569 Vitamin deficiency, unspecified: Secondary | ICD-10-CM | POA: Diagnosis not present

## 2014-07-30 NOTE — Telephone Encounter (Signed)
Rx for generic Valium called in to pharmacy. Stanton KidneyDebra Deundra Furber RN 07/30/14 11:45AM

## 2014-07-30 NOTE — Telephone Encounter (Signed)
Rx for generic Valium called in to pharmacy.

## 2014-07-31 ENCOUNTER — Ambulatory Visit: Payer: Self-pay

## 2014-07-31 DIAGNOSIS — Z1231 Encounter for screening mammogram for malignant neoplasm of breast: Secondary | ICD-10-CM | POA: Diagnosis not present

## 2014-08-11 ENCOUNTER — Telehealth: Payer: Self-pay | Admitting: Internal Medicine

## 2014-08-11 ENCOUNTER — Encounter: Payer: Self-pay | Admitting: Internal Medicine

## 2014-08-11 NOTE — Telephone Encounter (Signed)
Call to patient to confirm appointment for 2/10 at 8:15 lmtcb

## 2014-08-12 ENCOUNTER — Ambulatory Visit: Payer: Self-pay | Admitting: Internal Medicine

## 2014-08-13 ENCOUNTER — Ambulatory Visit (INDEPENDENT_AMBULATORY_CARE_PROVIDER_SITE_OTHER): Payer: Commercial Managed Care - HMO | Admitting: Internal Medicine

## 2014-08-13 ENCOUNTER — Encounter: Payer: Self-pay | Admitting: Internal Medicine

## 2014-08-13 ENCOUNTER — Encounter: Payer: Self-pay | Admitting: Licensed Clinical Social Worker

## 2014-08-13 VITALS — BP 136/72 | HR 82 | Temp 97.8°F | Ht 64.0 in | Wt 162.8 lb

## 2014-08-13 DIAGNOSIS — F329 Major depressive disorder, single episode, unspecified: Secondary | ICD-10-CM

## 2014-08-13 DIAGNOSIS — T7491XA Unspecified adult maltreatment, confirmed, initial encounter: Secondary | ICD-10-CM

## 2014-08-13 DIAGNOSIS — Z23 Encounter for immunization: Secondary | ICD-10-CM | POA: Diagnosis not present

## 2014-08-13 DIAGNOSIS — S22080D Wedge compression fracture of T11-T12 vertebra, subsequent encounter for fracture with routine healing: Secondary | ICD-10-CM

## 2014-08-13 DIAGNOSIS — K219 Gastro-esophageal reflux disease without esophagitis: Secondary | ICD-10-CM | POA: Diagnosis not present

## 2014-08-13 DIAGNOSIS — Z0000 Encounter for general adult medical examination without abnormal findings: Secondary | ICD-10-CM

## 2014-08-13 DIAGNOSIS — M810 Age-related osteoporosis without current pathological fracture: Secondary | ICD-10-CM

## 2014-08-13 DIAGNOSIS — F32A Depression, unspecified: Secondary | ICD-10-CM

## 2014-08-13 DIAGNOSIS — M609 Myositis, unspecified: Secondary | ICD-10-CM

## 2014-08-13 DIAGNOSIS — S22080S Wedge compression fracture of T11-T12 vertebra, sequela: Secondary | ICD-10-CM

## 2014-08-13 DIAGNOSIS — I10 Essential (primary) hypertension: Secondary | ICD-10-CM

## 2014-08-13 DIAGNOSIS — E876 Hypokalemia: Secondary | ICD-10-CM

## 2014-08-13 DIAGNOSIS — T466X5A Adverse effect of antihyperlipidemic and antiarteriosclerotic drugs, initial encounter: Secondary | ICD-10-CM

## 2014-08-13 DIAGNOSIS — W19XXXS Unspecified fall, sequela: Secondary | ICD-10-CM

## 2014-08-13 DIAGNOSIS — E559 Vitamin D deficiency, unspecified: Secondary | ICD-10-CM | POA: Insufficient documentation

## 2014-08-13 DIAGNOSIS — T466X5S Adverse effect of antihyperlipidemic and antiarteriosclerotic drugs, sequela: Secondary | ICD-10-CM

## 2014-08-13 DIAGNOSIS — G720 Drug-induced myopathy: Secondary | ICD-10-CM
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
## 2014-08-13 NOTE — Assessment & Plan Note (Signed)
Never started fosamax but wishes to start now. Fall last year resulted in acute compression fracture that was recommended for possible kyphoplasty procedure vs. Observation and brace. She elected for brace but then says she fell 2 months ago again and now left lower back has been hurting off and on. She is due to follow up with Dr. Ave Filterhandler and Dr. Yevette Edwardsumonski. Possible re-imaging at that visit vs. Proceeding with procedure. Currently, no weakness, numbness, tingling in extremities, loss of bowel or bladder. Good ROM on physical exam of back. Mild tenderness to palpation to left lower back that is improved with heat per patient.   -follow up with ortho -start fosamax--counseled to try to avoid erosive esophagitis, stay upright for 30-60 minutes after taking medication

## 2014-08-13 NOTE — Assessment & Plan Note (Addendum)
Had another fall approximately 2 months ago.   Due for follow up with ortho, may need repeat imaging vs. Proceeding with kyphoplasty?  She like heat to area of left lower back for relief Wears brace per otho

## 2014-08-13 NOTE — Patient Instructions (Addendum)
General Instructions:  Please bring your medicines with you each time you come to clinic.  Medicines may include prescription medications, over-the-counter medications, herbal remedies, eye drops, vitamins, or other pills.  Dear Ms. Bugaj,  Please call and follow up with Dr. Marshell Levanumonski's office for your back pain.   For your heart burn, continue nexium 20mg  daily, take 30 minutes before eating for another month, if your symptoms still persist, let me know and we can discuss more, may need to change dosing or investigate further  We discussed starting your bone medicine--fosamax. If you decide to take this, make sure to sit upright for at least 30 minutes as it can cause irritation and erosion to your esophagus. Do not take this medicine and lay down, so best to take when standing up and awake during day and as instructions listed with medicine  Please get your shingles vaccine  We will give you prevnar vaccination today  If your diarrhea comes back or you have abdominal pain, fever, chills, nausea, or vomiting, let me know right away or if severe go to the emergency room  Continue your cholesterol medicine  Send back those stool cards if you can and consider colonoscopy and schedule if you decide  Progress Toward Treatment Goals:  Treatment Goal 08/13/2014  Blood pressure at goal  Prevent falls deteriorated    Self Care Goals & Plans:  Self Care Goal 08/13/2014  Manage my medications take my medicines as prescribed; bring my medications to every visit; refill my medications on time; follow the sick day instructions if I am sick  Monitor my health keep track of my blood pressure; keep track of my weight  Eat healthy foods eat more vegetables; eat fruit for snacks and desserts; eat baked foods instead of fried foods; eat smaller portions; drink diet soda or water instead of juice or soda  Be physically active find an activity I enjoy  Meeting treatment goals -    No flowsheet data  found.   Care Management & Community Referrals:  Referral 11/19/2012  Referrals made for care management support none needed  Referrals made to community resources none
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
## 2014-08-13 NOTE — Assessment & Plan Note (Addendum)
Tolerates low dose pravastatin without complaints. Stopped a few months ago but restarted pravastatin 20mg  again after January labs from outside provider that showed LDL 121, HDL 28.5, cholesterol 201, and TG 167.   Will continue pravastatin 20mg  for now and recommend recheck in 1 year either with us or with her lab work done outside.

## 2014-08-13 NOTE — Assessment & Plan Note (Signed)
Symptomatic mainly at night past month per patient. Reports taking nexium at night. Reviewed proper way to take PPI, 30 minutes before. She had stopped it for a while and then recently restarted for maybe 1 month or less. Has tried prilosec, zantac, and protonix in the past but nexium works best.   Continue nexium 20mg  daily for another month, if still persistent, reassess, consider BID dosing vs. EGD if needed or if alarm symptoms occur--counseled on those today including worsening pain, trouble swallowing, fever, chills, weight loss, bleeding, among others.   Avoid caffeine if possible and other foods that may be causing GERD. She does not smoke and does not really eat spicy foods either but does drink coffee. Cautioned on possible esophagitis related to fosamax if she starts and educated on proper way to take the mediation to avoid this.
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
## 2014-08-13 NOTE — Assessment & Plan Note (Addendum)
bmet checked in January at [facility] shows K of 4.1 off potassium supplementation and no more lasix.   -I do not think she needs additional potassium supplementation but she says she usually takes 10 daily just in case. We will stop this, I think okay to stop now, but she prefers to keep taking especially since she had diarrhea. Thus, recommended her to stop for at least 1-2 weeks prior to her next visit/lab work so we can see what her labs look like off all supplementation again and then likely discontinue going forward all together.
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
## 2014-08-13 NOTE — Progress Notes (Signed)
Internal Medicine Clinic Attending  Case discussed with Dr. Qureshi at the time of the visit.  We reviewed the resident's history and exam and pertinent patient test results.  I agree with the assessment, diagnosis, and plan of care documented in the resident's note. 

## 2014-08-13 NOTE — Assessment & Plan Note (Signed)
Last vitamin d level checked 25hydroxy 07/2014 from outside provider down to 24.9  She reports she is getting treatment for this from her outside provider, report says [facility] [place]. She prefers for this to be treated with them and is getting vitamin d injections for supplementation since she says she cannot absorb oral medication Since another provider is checking labs and treating her, I have asked her to continue to follow with them and we will address the acute issues for which she comes to see us for.

## 2014-08-13 NOTE — Assessment & Plan Note (Signed)
prevnar today zostavax vaccine prescription provided She reports having the flu vaccine at [facility] in October Mammogram done 2 weeks ago, we need report Colonoscopy she insists on only digital but keeps changing her mind. We discussed again today, will continue to think about scheduling but did agree on stool cards, will have them sent or given to patient

## 2014-08-13 NOTE — Assessment & Plan Note (Signed)
BP Readings from Last 3 Encounters:  08/13/14 136/72  12/29/13 114/70  10/07/13 131/79    Lab Results  Component Value Date   NA 143 09/17/2012   K 4.0 09/17/2012   CREATININE 0.79 09/17/2012    Assessment: Blood pressure control: controlled Progress toward BP goal:  at goal  Plan: Medications:  continue current medications norvasc 5 and lisinopril 40mg  Educational resources provided: brochure, handout, video

## 2014-08-14 ENCOUNTER — Telehealth: Payer: Self-pay | Admitting: *Deleted

## 2014-08-14 NOTE — Telephone Encounter (Signed)
The donnatal i dont think she gets from us and her abdominal pain had resolved along with her diarrhea at the time of our visit. Is that no longer the case? I will fill the alendronate.

## 2014-08-14 NOTE — Telephone Encounter (Addendum)
Call from pt - states she needs a refill on Donnatal 1/2 tsp as needed for stomach spasms. Apparently she also needs a refill on Alendronate.

## 2014-08-14 NOTE — Telephone Encounter (Addendum)
Message from pt - stated she had filled Alendronate 70mg  rx previously yrs ago but did not start taking it; and it states to discard after 09/17/13. Thanks

## 2014-08-18 MED ORDER — ALENDRONATE SODIUM 70 MG PO TABS
70.0000 mg | ORAL_TABLET | ORAL | Status: DC
Start: 2014-08-18 — End: 2015-04-16

## 2014-08-18 NOTE — Telephone Encounter (Signed)
Pt states diarrhea has resolved but has stomach spasms from time to time, so only needs it PRN.  Also needs refill on Alendronate. Thanks

## 2014-08-18 NOTE — Telephone Encounter (Signed)
Alendronate has been refilled. I am not familiar with Donnatal for her and would not like to order that at this time. If her symptoms have resolve that is good and will continue to monitor.

## 2014-08-18 NOTE — Telephone Encounter (Signed)
Pt called /informed of Alendronate refill only; and will continue to monitor if she has any abd symptoms.

## 2014-09-03 ENCOUNTER — Other Ambulatory Visit: Payer: Self-pay | Admitting: Internal Medicine

## 2014-09-04 NOTE — Telephone Encounter (Signed)
Hi helen,  This is for an antibiotic? Does she have an infection? Has she been evaluated? If she hasn't, she needs to be.  Thanks,  Dr q

## 2014-09-16 ENCOUNTER — Encounter: Payer: Self-pay | Admitting: Internal Medicine

## 2014-09-16 ENCOUNTER — Ambulatory Visit (INDEPENDENT_AMBULATORY_CARE_PROVIDER_SITE_OTHER): Payer: Commercial Managed Care - HMO | Admitting: Internal Medicine

## 2014-09-16 VITALS — BP 132/75 | HR 80 | Temp 98.4°F | Ht 64.0 in | Wt 161.2 lb

## 2014-09-16 DIAGNOSIS — I10 Essential (primary) hypertension: Secondary | ICD-10-CM | POA: Diagnosis not present

## 2014-09-16 DIAGNOSIS — N3000 Acute cystitis without hematuria: Secondary | ICD-10-CM | POA: Diagnosis not present

## 2014-09-16 DIAGNOSIS — N3001 Acute cystitis with hematuria: Secondary | ICD-10-CM | POA: Diagnosis not present

## 2014-09-16 LAB — POCT URINALYSIS DIPSTICK
Bilirubin, UA: NEGATIVE
GLUCOSE UA: NEGATIVE
KETONES UA: NEGATIVE
NITRITE UA: NEGATIVE
PH UA: 6.5
Protein, UA: NEGATIVE
Spec Grav, UA: 1.015
UROBILINOGEN UA: 1

## 2014-09-16 MED ORDER — FOSFOMYCIN TROMETHAMINE 3 G PO PACK: 3.0000 g | PACK | Freq: Once | ORAL | Status: DC

## 2014-09-16 NOTE — Progress Notes (Signed)
Patient ID: Jill Hancock, female   DOB: 08/24/1940, 74 y.o.   MRN: [id number]     Subjective:   Patient ID: Jill Hancock female    DOB: Oct 29, 1940 74 y.o.    MRN: [id number] Health Maintenance Due: Health Maintenance Due  Topic Date Due  . COLONOSCOPY  04/24/1990  . ZOSTAVAX  04/24/2000    _________________________________________________  HPI: [name] is a 74 y.o. female here for an acute visit.  Pt has a PMH outlined below.  Please see problem-based charting assessment and plan note for further details of medical issues addressed at today's visit.  PMH: Past Medical History  Diagnosis Date  . Hypertension   . Vertigo   . Stroke 02/2012    MRI revealed at least 3 subcentimeter acute infarctions with one area of subacute infarction in widely disparate vascular territories including territory of left cerebellum and around right caudate nucleus along with widespread lacunar infarcts, chronic microvascular ischemic change, and numerous microbleeds suggesting long standing hypertensive cerebrovascular disease.  MRA confirmed intracranial  . Hyperglycemia   . Multiple cerebral infarctions august 2013  . Unsteady gait   . Hypokalemia     Medications: Current Outpatient Prescriptions on File Prior to Visit  Medication Sig Dispense Refill  . alendronate (FOSAMAX) 70 MG tablet Take 1 tablet (70 mg total) by mouth every 7 (seven) days. Take with full glass of water on empty stomach, stay upright after taking 7 tablet 1  . amLODipine (NORVASC) 5 MG tablet Take 1 tablet (5 mg total) by mouth daily. 90 tablet 5  . aspirin 81 MG tablet Take 1 tablet (81 mg total) by mouth daily.    . diazepam (VALIUM) 2 MG tablet Take 0.5 tablets (1 mg total) by mouth at bedtime as needed for anxiety. 30 tablet 0  . diclofenac sodium (VOLTAREN) 1 % GEL Apply 2 g topically 4 (four) times daily. (Patient not taking: Reported on 08/13/2014) 1 Tube 1  . esomeprazole (NEXIUM) 20 MG capsule  Take 20 mg by mouth daily before breakfast.    . FLUoxetine (PROZAC) 20 MG capsule TAKE ONE CAPSULE BY MOUTH EVERY DAY (Patient not taking: Reported on 08/13/2014) 30 capsule 3  . lisinopril (PRINIVIL,ZESTRIL) 40 MG tablet Take 1 tablet (40 mg total) by mouth daily. 90 tablet 5  . meclizine (ANTIVERT) 25 MG tablet TAKE 1 TABLET (25 MG TOTAL) BY MOUTH 3 (THREE) TIMES DAILY AS NEEDED FOR DIZZINESS. 90 tablet 0  . potassium chloride (KLOR-CON M10) 10 MEQ tablet Take 1 tablet (10 mEq total) by mouth daily. 90 tablet 3  . pravastatin (PRAVACHOL) 20 MG tablet Take 1 tablet (20 mg total) by mouth every other day. 90 tablet 5   No current facility-administered medications on file prior to visit.    Allergies: Allergies  Allergen Reactions  . Demerol [Meperidine] Nausea And Vomiting  . Macrobid [Nitrofurantoin Macrocrystal]   . Penicillins Rash    FH: Family History  Problem Relation Age of Onset  . Heart attack Brother     death at age 4  . Stroke Brother     death at 59  . Heart attack Mother     SH: History   Social History  . Marital Status: Married    Spouse Name: N/A  . Number of Children: 5  . Years of Education: N/A   Occupational History  . RN    Social History Main Topics  . Smoking status: Never Smoker   . Smokeless tobacco:  Not on file  . Alcohol Use: 0.6 oz/week    1 Glasses of wine per week     Comment: occasionally  . Drug Use: No  . Sexual Activity: Not on file   Other Topics Concern  . None   Social History Narrative    Review of Systems: Constitutional: Negative for fever, chills and weight loss.  Eyes: Negative for blurred vision.  Respiratory: Negative for cough and shortness of breath.  Cardiovascular: Negative for chest pain, palpitations and leg swelling.  Gastrointestinal: Negative for nausea, vomiting, abdominal pain, diarrhea, constipation and blood in stool.  Genitourinary: +dysuria, urgency and frequency.  Musculoskeletal: Negative for  myalgias and +back pain.  Neurological: Negative for dizziness, weakness and headaches.     Objective:   Vital Signs: Filed Vitals:   09/16/14 1440  BP: 132/75  Pulse: 80  Temp: 98.4 F (36.9 C)  TempSrc: Oral  Height: 5\' 4"  (1.626 m)  Weight: 161 lb 3.2 oz (73.12 kg)  SpO2: 97%      BP Readings from Last 3 Encounters:  09/16/14 132/75  08/13/14 136/72  12/29/13 114/70    Physical Exam: Constitutional: Vital signs reviewed.  Patient is in NAD and cooperative with exam.  Head: Normocephalic and atraumatic. Eyes: PERRL, EOMI, conjunctivae nl, no scleral icterus.  Neck: Supple. Cardiovascular: RRR, no MRG. Pulmonary/Chest: normal effort, CTAB, no wheezes, rales, or rhonchi. Abdominal: Soft. NT/ND +BS.  No flank or suprapubic tenderness.   Neurological: A&O x3, cranial nerves II-XII are grossly intact, moving all extremities. Extremities: 2+DP b/l; no pitting edema. Skin: Warm, dry and intact. No rash.   Assessment & Plan:   Assessment and plan was discussed and formulated with my attending.

## 2014-09-16 NOTE — Patient Instructions (Signed)
Thank you for your visit today.   Please return to the internal medicine clinic as needed or if your symptoms do not improve in 1 week. I have given you one dose of fosfomycin for a UTI. Please use AZO or similar products if this helps you for a couple of days. A warm heating pad or bath may also help. Avoid using dial or other harsh detergents in your private areas.  Use only mild cleansers such as dove or just cleansing with water. Make sure to void before and after sexual intercourse. Always wipe from front to back. Drink plenty of liquids.      Your current medical regimen is effective;  continue present plan and take all medications as prescribed.    Please be sure to bring all of your medications with you to every visit; this includes herbal supplements, vitamins, eye drops, and any over-the-counter medications.   Should you have any questions regarding your medications and/or any new or worsening symptoms, please be sure to call the clinic at [phone].   If you believe that you are suffering from a life threatening condition or one that may result in the loss of limb or function, then you should call 911 or proceed to the nearest Emergency Department.    Urinary Tract Infection Urinary tract infections (UTIs) can develop anywhere along your urinary tract. Your urinary tract is your body's drainage system for removing wastes and extra water. Your urinary tract includes two kidneys, two ureters, a bladder, and a urethra. Your kidneys are a pair of bean-shaped organs. Each kidney is about the size of your fist. They are located below your ribs, one on each side of your spine. CAUSES Infections are caused by microbes, which are microscopic organisms, including fungi, viruses, and bacteria. These organisms are so small that they can only be seen through a microscope. Bacteria are the microbes that most commonly cause UTIs. SYMPTOMS  Symptoms of UTIs may vary by age and gender of the  patient and by the location of the infection. Symptoms in young women typically include a frequent and intense urge to urinate and a painful, burning feeling in the bladder or urethra during urination. Older women and men are more likely to be tired, shaky, and weak and have muscle aches and abdominal pain. A fever may mean the infection is in your kidneys. Other symptoms of a kidney infection include pain in your back or sides below the ribs, nausea, and vomiting. DIAGNOSIS To diagnose a UTI, your caregiver will ask you about your symptoms. Your caregiver also will ask to provide a urine sample. The urine sample will be tested for bacteria and white blood cells. White blood cells are made by your body to help fight infection. TREATMENT  Typically, UTIs can be treated with medication. Because most UTIs are caused by a bacterial infection, they usually can be treated with the use of antibiotics. The choice of antibiotic and length of treatment depend on your symptoms and the type of bacteria causing your infection. HOME CARE INSTRUCTIONS  If you were prescribed antibiotics, take them exactly as your caregiver instructs you. Finish the medication even if you feel better after you have only taken some of the medication.  Drink enough water and fluids to keep your urine clear or pale yellow.  Avoid caffeine, tea, and carbonated beverages. They tend to irritate your bladder.  Empty your bladder often. Avoid holding urine for long periods of time.  Empty your bladder before and  after sexual intercourse.  After a bowel movement, women should cleanse from front to back. Use each tissue only once. SEEK MEDICAL CARE IF:   You have back pain.  You develop a fever.  Your symptoms do not begin to resolve within 3 days. SEEK IMMEDIATE MEDICAL CARE IF:   You have severe back pain or lower abdominal pain.  You develop chills.  You have nausea or vomiting.  You have continued burning or discomfort  with urination. MAKE SURE YOU:   Understand these instructions.  Will watch your condition.  Will get help right away if you are not doing well or get worse. Document Released: 03/29/2005 Document Revised: 12/19/2011 Document Reviewed: 07/28/2011 [facility] Patient Information 2015 [place], [place]. This information is not intended to replace advice given to you by your health care provider. Make sure you discuss any questions you have with your health care provider.

## 2014-09-16 NOTE — Assessment & Plan Note (Addendum)
Jill CockerLinda S Hancock is a 74 y.o. female who complains of urinary frequency, urgency and dysuria x 1 week, without flank pain, fever, chills, or abnormal vaginal discharge or bleeding.   Pt appears well, in no apparent distress.  Vital signs are normal. The abdomen is soft without tenderness, guarding, mass, rebound or organomegaly. No CVA tenderness or inguinal adenopathy noted. Urine dipstick shows positive for leukocytes, trace blood.  Previous h/o UTI without evidence of pyelonephritis. -fosfomycin x 1 -continue to drink plenty of water, may use Pyridium OTC prn -send urine for UA/micro, cx -call or return to clinic prn if these symptoms worsen or fail to improve as anticipated

## 2014-09-17 LAB — URINALYSIS, ROUTINE W REFLEX MICROSCOPIC
Bilirubin Urine: NEGATIVE
Glucose, UA: NEGATIVE mg/dL
KETONES UR: NEGATIVE mg/dL
NITRITE: NEGATIVE
PH: 6.5 (ref 5.0–8.0)
PROTEIN: NEGATIVE mg/dL
SPECIFIC GRAVITY, URINE: 1.005 (ref 1.005–1.030)
Urobilinogen, UA: 1 mg/dL (ref 0.0–1.0)

## 2014-09-17 LAB — URINE CULTURE

## 2014-09-17 LAB — URINALYSIS, MICROSCOPIC ONLY
Casts: NONE SEEN
Crystals: NONE SEEN

## 2014-09-17 NOTE — Progress Notes (Signed)
Internal Medicine Clinic Attending Date of Visit: 09/16/2014  Case discussed with Dr. Gill soon after the resident saw the patient on the day of the visit .  We reviewed the resident's history and exam and pertinent patient test results.  I agree with the assessment, diagnosis, and plan of care documented in the resident's note. 

## 2014-09-17 NOTE — Assessment & Plan Note (Signed)
BP controlled. -continue current meds

## 2014-09-22 ENCOUNTER — Telehealth: Payer: Self-pay | Admitting: *Deleted

## 2014-09-22 NOTE — Telephone Encounter (Signed)
Message was left by C. Lissa HoardBoone pt wants results of labs 09/16/14. Stanton KidneyDebra Trumaine Wimer RN 09/22/14 4:25PM

## 2014-09-23 ENCOUNTER — Telehealth: Payer: Self-pay | Admitting: *Deleted

## 2014-09-23 ENCOUNTER — Other Ambulatory Visit: Payer: Self-pay | Admitting: Internal Medicine

## 2014-09-23 MED ORDER — CIPROFLOXACIN HCL 500 MG PO TABS: 500.0000 mg | ORAL_TABLET | Freq: Two times a day (BID) | ORAL | Status: AC

## 2014-09-23 NOTE — Telephone Encounter (Signed)
Sent prescription for cipro to her pharmacy per patient's request.  Thanks!

## 2014-09-23 NOTE — Progress Notes (Signed)
Sent a prescription for cipro x 3 days to Ms. Ashenfelter's pharmacy since she was unable to afford the fosfomycin per her request.

## 2014-09-23 NOTE — Telephone Encounter (Signed)
Please call Jill Hancock and let her know that her urine culture grew out Group B streptococcus and the fosfomycin should cover this organism.  Please find out if her symptoms of a UTI have resolved and let me know if they have not.  Thanks!

## 2014-09-23 NOTE — Telephone Encounter (Signed)
Returned pt's call - stated co-pay for Fosfomycin is too expensive; stated cost is $67.00, so she has not taken this medication. Thanks

## 2014-09-24 NOTE — Telephone Encounter (Signed)
Pt called and informed of new rx for Cipro.

## 2014-09-29 ENCOUNTER — Other Ambulatory Visit: Payer: Self-pay | Admitting: Internal Medicine

## 2014-09-29 DIAGNOSIS — S22080D Wedge compression fracture of T11-T12 vertebra, subsequent encounter for fracture with routine healing: Secondary | ICD-10-CM

## 2014-10-05 ENCOUNTER — Other Ambulatory Visit: Payer: Self-pay | Admitting: Internal Medicine

## 2014-10-07 ENCOUNTER — Other Ambulatory Visit: Payer: Self-pay | Admitting: *Deleted

## 2014-10-07 NOTE — Telephone Encounter (Signed)
Hi Helen,  The prozac was just refilled in March. We are titrating the dose, thus dont think 90 day supplies are appropriate at this time. Also, I believe we discussed stopping K supplementation on her last visit.   Thanks,  Dr q

## 2014-10-12 NOTE — Telephone Encounter (Signed)
Talked with pt - feeling much better.

## 2014-10-30 ENCOUNTER — Ambulatory Visit: Payer: Commercial Managed Care - HMO | Admitting: Neurology

## 2014-10-30 ENCOUNTER — Telehealth: Payer: Self-pay | Admitting: *Deleted

## 2014-10-30 DIAGNOSIS — I639 Cerebral infarction, unspecified: Secondary | ICD-10-CM

## 2014-10-30 NOTE — Telephone Encounter (Signed)
Call from pt - requesting an Neurology referral to f/u with Dr Pearlean BrownieSethi at [facility] for [facility]. States she tried to make an appt but was told she needs a referral. Thanks

## 2014-10-31 NOTE — Telephone Encounter (Signed)
Referral placed.  Thanks,  Dr q

## 2014-11-09 DIAGNOSIS — M546 Pain in thoracic spine: Secondary | ICD-10-CM | POA: Diagnosis not present

## 2014-11-12 ENCOUNTER — Encounter: Payer: Self-pay | Admitting: *Deleted

## 2014-11-20 DIAGNOSIS — M546 Pain in thoracic spine: Secondary | ICD-10-CM | POA: Diagnosis not present

## 2014-11-24 DIAGNOSIS — M546 Pain in thoracic spine: Secondary | ICD-10-CM | POA: Diagnosis not present

## 2014-11-25 DIAGNOSIS — I10 Essential (primary) hypertension: Secondary | ICD-10-CM | POA: Diagnosis not present

## 2014-11-25 DIAGNOSIS — H5210 Myopia, unspecified eye: Secondary | ICD-10-CM | POA: Diagnosis not present

## 2014-11-25 DIAGNOSIS — H5200 Hypermetropia, unspecified eye: Secondary | ICD-10-CM | POA: Diagnosis not present

## 2014-12-03 DIAGNOSIS — M546 Pain in thoracic spine: Secondary | ICD-10-CM | POA: Diagnosis not present

## 2014-12-07 NOTE — Addendum Note (Signed)
Addended by: Neomia DearPOWERS, Erikah Thumm E on: 12/07/2014 06:11 PM   Modules accepted: Orders

## 2014-12-08 ENCOUNTER — Other Ambulatory Visit: Payer: Self-pay | Admitting: Internal Medicine

## 2014-12-08 DIAGNOSIS — M546 Pain in thoracic spine: Secondary | ICD-10-CM | POA: Diagnosis not present

## 2014-12-08 NOTE — Telephone Encounter (Signed)
Potassium was discontinued in the past.   Thanks,  gayle

## 2015-01-14 ENCOUNTER — Other Ambulatory Visit: Payer: Self-pay | Admitting: Internal Medicine

## 2015-01-15 NOTE — Telephone Encounter (Signed)
Per Dr. Virgina OrganQureshi last note, should be stopped, however, she has not seen PCP since that time.  If no appointment with Dr. Ala DachFord, please make one and she will need to discuss further K supplementation.  Rx X1 without refills today.

## 2015-01-22 ENCOUNTER — Ambulatory Visit: Payer: Commercial Managed Care - HMO | Admitting: Neurology

## 2015-01-25 NOTE — Addendum Note (Signed)
Addended by: Neomia Dear on: 01/25/2015 09:17 PM   Modules accepted: Orders

## 2015-02-12 ENCOUNTER — Other Ambulatory Visit: Payer: Self-pay | Admitting: Internal Medicine

## 2015-02-12 DIAGNOSIS — F419 Anxiety disorder, unspecified: Secondary | ICD-10-CM

## 2015-02-14 NOTE — Telephone Encounter (Signed)
Good day. Am I able to prescribe this over E-prescribe or do I need to sign? Thank you.

## 2015-02-23 MED ORDER — DIAZEPAM 2 MG PO TABS: 1.0000 mg | ORAL_TABLET | Freq: Every evening | ORAL | Status: DC | PRN

## 2015-02-23 NOTE — Addendum Note (Signed)
Addended by: Karl Pock on: 02/23/2015 11:10 AM   Modules accepted: Orders

## 2015-02-23 NOTE — Telephone Encounter (Signed)
Could not call in under dr ford's dea/ npi had to use dr butcher's info, called to [facility]

## 2015-03-02 DIAGNOSIS — M79605 Pain in left leg: Secondary | ICD-10-CM | POA: Diagnosis not present

## 2015-03-02 DIAGNOSIS — M546 Pain in thoracic spine: Secondary | ICD-10-CM | POA: Diagnosis not present

## 2015-03-02 DIAGNOSIS — M25552 Pain in left hip: Secondary | ICD-10-CM | POA: Diagnosis not present

## 2015-03-03 ENCOUNTER — Telehealth: Payer: Self-pay | Admitting: Internal Medicine

## 2015-03-03 NOTE — Telephone Encounter (Signed)
Call to patient to confirm appointment for 03/04/15 at 2:30 lmtcb

## 2015-03-04 ENCOUNTER — Encounter: Payer: Self-pay | Admitting: Internal Medicine

## 2015-03-04 ENCOUNTER — Encounter: Payer: Commercial Managed Care - HMO | Admitting: Internal Medicine

## 2015-03-22 ENCOUNTER — Other Ambulatory Visit: Payer: Self-pay | Admitting: Internal Medicine

## 2015-03-22 DIAGNOSIS — S22080D Wedge compression fracture of T11-T12 vertebra, subsequent encounter for fracture with routine healing: Secondary | ICD-10-CM

## 2015-03-22 DIAGNOSIS — M810 Age-related osteoporosis without current pathological fracture: Secondary | ICD-10-CM

## 2015-03-22 DIAGNOSIS — M25552 Pain in left hip: Secondary | ICD-10-CM

## 2015-03-22 NOTE — Addendum Note (Signed)
Addended by: Blanch Media A on: 03/22/2015 02:26 PM   Modules accepted: Orders

## 2015-03-23 ENCOUNTER — Other Ambulatory Visit: Payer: Self-pay | Admitting: *Deleted

## 2015-03-23 DIAGNOSIS — F32A Depression, unspecified: Secondary | ICD-10-CM

## 2015-03-23 DIAGNOSIS — F329 Major depressive disorder, single episode, unspecified: Secondary | ICD-10-CM

## 2015-03-23 MED ORDER — FLUOXETINE HCL 20 MG PO CAPS: 20.0000 mg | ORAL_CAPSULE | Freq: Every day | ORAL | Status: DC

## 2015-04-05 ENCOUNTER — Ambulatory Visit: Payer: Self-pay | Admitting: Internal Medicine

## 2015-04-12 ENCOUNTER — Encounter: Payer: Self-pay | Admitting: Internal Medicine

## 2015-04-12 ENCOUNTER — Ambulatory Visit: Payer: Self-pay | Admitting: Internal Medicine

## 2015-04-14 ENCOUNTER — Telehealth: Payer: Self-pay | Admitting: *Deleted

## 2015-04-14 NOTE — Telephone Encounter (Signed)
Return pt's call - states she has shingles on her chest and needs a refill on Valayclovir 500 mg. Informed pt Dr Ala DachFord has not seen her and did not originally order this medication, she needs to schedule an appt. Pt agreed to come in on Friday @ 1345PM to see Dr Ala DachFord.

## 2015-04-16 ENCOUNTER — Encounter: Payer: Self-pay | Admitting: Internal Medicine

## 2015-04-16 ENCOUNTER — Ambulatory Visit (INDEPENDENT_AMBULATORY_CARE_PROVIDER_SITE_OTHER): Payer: Commercial Managed Care - HMO | Admitting: Internal Medicine

## 2015-04-16 VITALS — BP 122/66 | HR 77 | Temp 98.2°F | Ht 64.0 in | Wt 161.2 lb

## 2015-04-16 DIAGNOSIS — R350 Frequency of micturition: Secondary | ICD-10-CM

## 2015-04-16 DIAGNOSIS — R3915 Urgency of urination: Secondary | ICD-10-CM | POA: Diagnosis not present

## 2015-04-16 DIAGNOSIS — E785 Hyperlipidemia, unspecified: Secondary | ICD-10-CM | POA: Diagnosis not present

## 2015-04-16 DIAGNOSIS — N3001 Acute cystitis with hematuria: Secondary | ICD-10-CM | POA: Diagnosis not present

## 2015-04-16 DIAGNOSIS — L989 Disorder of the skin and subcutaneous tissue, unspecified: Secondary | ICD-10-CM | POA: Diagnosis not present

## 2015-04-16 DIAGNOSIS — B029 Zoster without complications: Secondary | ICD-10-CM

## 2015-04-16 DIAGNOSIS — M25551 Pain in right hip: Secondary | ICD-10-CM

## 2015-04-16 DIAGNOSIS — T7491XD Unspecified adult maltreatment, confirmed, subsequent encounter: Secondary | ICD-10-CM

## 2015-04-16 DIAGNOSIS — M810 Age-related osteoporosis without current pathological fracture: Secondary | ICD-10-CM

## 2015-04-16 DIAGNOSIS — N3000 Acute cystitis without hematuria: Secondary | ICD-10-CM

## 2015-04-16 DIAGNOSIS — Z630 Problems in relationship with spouse or partner: Secondary | ICD-10-CM

## 2015-04-16 MED ORDER — FOSFOMYCIN TROMETHAMINE 3 G PO PACK: 3.0000 g | PACK | Freq: Once | ORAL | Status: DC

## 2015-04-16 MED ORDER — ALENDRONATE SODIUM 70 MG PO TABS: 70.0000 mg | ORAL_TABLET | ORAL | Status: DC

## 2015-04-16 MED ORDER — DICLOFENAC SODIUM 1 % TD GEL: 2.0000 g | Freq: Four times a day (QID) | TRANSDERMAL | Status: DC

## 2015-04-16 MED ORDER — LIDOCAINE 5 % EX PTCH: 1.0000 | MEDICATED_PATCH | CUTANEOUS | Status: DC

## 2015-04-16 NOTE — Assessment & Plan Note (Signed)
Pain in her IT band region, although unlikely to be IT band syndrome since she is not particularly active and does not have lateral knee pain. - Voltaren gel

## 2015-04-16 NOTE — Patient Instructions (Addendum)
[name], it was a pleasure meeting you today. For your shingles, we are providing a lidocaine patch. If the pain worsens or you notice new lesions, please call the clinic so that we can provide Valtrex.  We're providing voltaren gel for your IT band.  If your urine is positive for a UTI, you can take the fosfomycin I prescribed. Otherwise, do not take it.  Please be sure to take fosamax, as this can lower your risk of fractures, which can be serious.  Have a great day.

## 2015-04-16 NOTE — Assessment & Plan Note (Signed)
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 There is no role for Valtrex to address post-herpetic neuralgia >72 hours after onset without evidence of newer lesions or expansion. It seems unusual that she would have multiple episodes of zoster a year with an in tact immune system. - Lidoderm patch - HIV test - Told to call if zoster worsens or expands

## 2015-04-16 NOTE — Assessment & Plan Note (Signed)
Lipid panel today

## 2015-04-16 NOTE — Assessment & Plan Note (Signed)
Urinary frequency and urgency are suggestive of UTI; however, no flank pain, fever, chills, or abnormal vaginal discharge or bleeding. Pt appears well, in no apparent distress. Vital signs are normal. The abdomen is soft without suprapubic tenderness, guarding, mass, rebound or organomegaly.   -Urinalysis -If suggestive of UTI, will call and inform to start fosfomycin -call or return to clinic prn if these symptoms worsen or fail to improve as anticipated
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
## 2015-04-16 NOTE — Progress Notes (Signed)
   Subjective:    Patient ID: Jill CockerLinda S Hancock, female    DOB: 01-Feb-1941, 74 y.o.   MRN: [id number]  HPI  Jill Hancock is a 74 yo woman with a PMH of osteoporosis,and  HTN, HLD who presents to this clinic with a rash and dysuria. Her rash started approximately a week ago and is located under her left breast. She believes these are shingles and she is specifically requesting Valtrex. She says they are itchy burning lesions that have been fairly stable over the past week without any new lesions in the past 72 hours. She reports several episodes of these shingles-like lesions a year.   She also complains of pain urination and increased urinary urgency over the past week. In the past, she says that these symptoms have often meant UTI, for which she takes antibiotics. She gets a UTI several times in a year. She denies any fever or flank pain.  She also mentioned a month long pain on the lateral side of her left leg. 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     Review of Systems  Constitutional: Negative for fever, chills and fatigue.  HENT: Negative for congestion and sore throat.   Respiratory: Negative for cough and shortness of breath.   Cardiovascular: Negative for chest pain.  Gastrointestinal: Negative for nausea, vomiting, abdominal pain and diarrhea.  Genitourinary: Positive for dysuria, urgency and frequency.  Musculoskeletal: Positive for myalgias.  Skin: Positive for rash.  Neurological: Negative for syncope and weakness.  Hematological: Negative for adenopathy.  Psychiatric/Behavioral: Negative for dysphoric mood.       Objective:   Physical Exam  Constitutional: She appears well-developed and well-nourished. No distress.  HENT:  Mouth/Throat: Oropharynx is clear and moist. No oropharyngeal exudate.  Eyes: EOM are normal. Pupils are equal, round, and reactive to light.  Neck: Neck supple.  Cardiovascular: Normal rate, regular rhythm and normal heart  sounds.   Pulmonary/Chest: Effort normal and breath sounds normal. No respiratory distress.  Abdominal: Soft. Bowel sounds are normal. She exhibits no distension.  Musculoskeletal:  Tenderness to palpation of left IT band  Lymphadenopathy:    She has no cervical adenopathy.  Neurological: She is alert. She has normal reflexes.  Skin: Rash noted.  Two well-demarcated erythematous lesions below the left breast, not defnitively, although possibly in a dermatomal distribution. No lesions within the same hypothetical dermatome extending to the back.  Psychiatric: She has a normal mood and affect.          Assessment & Plan:  Please see problem based assessment and plan for details.
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
## 2015-04-17 LAB — LIPID PANEL
Chol/HDL Ratio: 3.1 ratio units (ref 0.0–4.4)
Cholesterol, Total: 165 mg/dL (ref 100–199)
HDL: 54 mg/dL (ref >39)
LDL CALC: 84 mg/dL (ref 0–99)
Triglycerides: 136 mg/dL (ref 0–149)
VLDL CHOLESTEROL CAL: 27 mg/dL (ref 5–40)

## 2015-04-17 LAB — URINALYSIS, ROUTINE W REFLEX MICROSCOPIC
Bilirubin, UA: NEGATIVE
Glucose, UA: NEGATIVE
Ketones, UA: NEGATIVE
Nitrite, UA: NEGATIVE
PH UA: 7 (ref 5.0–7.5)
PROTEIN UA: NEGATIVE
SPEC GRAV UA: 1.016 (ref 1.005–1.030)
Urobilinogen, Ur: 0.2 mg/dL (ref 0.2–1.0)

## 2015-04-17 LAB — MICROSCOPIC EXAMINATION
Casts: NONE SEEN /lpf
Epithelial Cells (non renal): >10 /hpf — AB (ref 0–10)

## 2015-04-17 LAB — HIV ANTIBODY (ROUTINE TESTING W REFLEX): HIV SCREEN 4TH GENERATION: NONREACTIVE

## 2015-04-18 ENCOUNTER — Telehealth: Payer: Self-pay | Admitting: Internal Medicine

## 2015-04-18 ENCOUNTER — Other Ambulatory Visit: Payer: Self-pay | Admitting: Internal Medicine

## 2015-04-18 NOTE — Telephone Encounter (Signed)
Attempted to call to see if she was able to obtain fosfomycin for UTI. No answer.

## 2015-04-19 ENCOUNTER — Telehealth: Payer: Self-pay | Admitting: *Deleted

## 2015-04-19 ENCOUNTER — Other Ambulatory Visit: Payer: Self-pay | Admitting: Internal Medicine

## 2015-04-19 DIAGNOSIS — B029 Zoster without complications: Secondary | ICD-10-CM | POA: Insufficient documentation

## 2015-04-19 MED ORDER — VALACYCLOVIR HCL 500 MG PO TABS: 500.0000 mg | ORAL_TABLET | Freq: Three times a day (TID) | ORAL | Status: DC

## 2015-04-19 NOTE — Telephone Encounter (Signed)
Call from pt - states she needs the medication for shingle; having itching. Send to [facility] on [address]. Telephone # [phone]

## 2015-04-19 NOTE — Progress Notes (Signed)
Internal Medicine Clinic Attending  I saw and evaluated the patient.  I personally confirmed the key portions of the history and exam documented by Dr. Ford and I reviewed pertinent patient test results.  The assessment, diagnosis, and plan were formulated together and I agree with the documentation in the resident's note. 

## 2015-04-19 NOTE — Addendum Note (Signed)
Addended by: Erlinda HongVINCENT, DUNCAN T on: 04/19/2015 10:58 AM   Modules accepted: Level of Service

## 2015-04-20 NOTE — Telephone Encounter (Signed)
Pt called / informed rx has been sent to [facility].

## 2015-04-26 ENCOUNTER — Telehealth: Payer: Self-pay | Admitting: *Deleted

## 2015-04-26 NOTE — Telephone Encounter (Signed)
Received PA request from [facility] for pt's Lidocaine rx.  Request submitted online via Cover My meds-Rx for shingles, request approved, attempted to contact pt, but unable to do so at phone numbers on chart-[facility] to mail out rx.  Phone call complete.Kingsley SpittleGoldston, Darlene Cassady10/24/201612:04 PM

## 2015-04-27 ENCOUNTER — Telehealth: Payer: Self-pay | Admitting: *Deleted

## 2015-04-27 NOTE — Telephone Encounter (Signed)
Pt had left a message to call her about lab results - pt call returned; no answer; no voice mail has been set up - unable to leave message. Home # has been disconnected.

## 2015-04-28 ENCOUNTER — Telehealth: Payer: Self-pay | Admitting: Internal Medicine

## 2015-04-28 NOTE — Telephone Encounter (Signed)
Pt requesting lab result. Please call back.  °

## 2015-04-28 NOTE — Telephone Encounter (Signed)
Talked with pt and current home phone # [phone]  sent on message to Dr Ala DachFord.

## 2015-04-29 ENCOUNTER — Other Ambulatory Visit: Payer: Self-pay | Admitting: Internal Medicine

## 2015-04-29 DIAGNOSIS — B029 Zoster without complications: Secondary | ICD-10-CM

## 2015-04-30 ENCOUNTER — Telehealth: Payer: Self-pay | Admitting: Internal Medicine

## 2015-04-30 ENCOUNTER — Other Ambulatory Visit: Payer: Self-pay | Admitting: Internal Medicine

## 2015-04-30 DIAGNOSIS — N3000 Acute cystitis without hematuria: Secondary | ICD-10-CM

## 2015-04-30 MED ORDER — FOSFOMYCIN TROMETHAMINE 3 G PO PACK: 3.0000 g | PACK | Freq: Once | ORAL | Status: DC

## 2015-04-30 NOTE — Telephone Encounter (Signed)
Called Jill Hancock to inform her that I had sent her fosfomycin to [facility], but she had already obtained it. I also informed her that if the Valtrex is ineffective, she should schedule an appointment with the Internal Medicine Clinic. However, she said that it had been improving.

## 2015-04-30 NOTE — Telephone Encounter (Signed)
Pt want the doctor to call in med for UTI.  Please call pt back.

## 2015-04-30 NOTE — Telephone Encounter (Signed)
I will prescribe this one more time. If it does not improve, she will need to schedule a follow-up [facility] in [facility].

## 2015-04-30 NOTE — Telephone Encounter (Signed)
Spoke with patient. She never received rx for fosfomycin 3g pack on 10/14, it was [facility]  [facility].   Called [facility] to have rx canceled on their list as an error Just FYI

## 2015-05-11 ENCOUNTER — Ambulatory Visit (INDEPENDENT_AMBULATORY_CARE_PROVIDER_SITE_OTHER): Payer: Commercial Managed Care - HMO | Admitting: Internal Medicine

## 2015-05-11 ENCOUNTER — Encounter: Payer: Self-pay | Admitting: Internal Medicine

## 2015-05-11 VITALS — BP 145/75 | HR 75 | Temp 97.8°F | Ht 64.0 in | Wt 161.2 lb

## 2015-05-11 DIAGNOSIS — N3000 Acute cystitis without hematuria: Secondary | ICD-10-CM

## 2015-05-11 DIAGNOSIS — B029 Zoster without complications: Secondary | ICD-10-CM

## 2015-05-11 DIAGNOSIS — L304 Erythema intertrigo: Secondary | ICD-10-CM | POA: Diagnosis not present

## 2015-05-11 MED ORDER — NYSTATIN 100000 UNIT/GM EX CREA: 1.0000 "application " | TOPICAL_CREAM | Freq: Two times a day (BID) | CUTANEOUS | Status: DC

## 2015-05-11 MED ORDER — LIDOCAINE 5 % EX PTCH: 1.0000 | MEDICATED_PATCH | CUTANEOUS | Status: DC

## 2015-05-11 NOTE — Assessment & Plan Note (Addendum)
Patient w/ previous zoster infection on her left trunk, however it appears this has almost completely resolved. It does appear that she has a candida infection under her left breast in the same area that would very much explain her symptoms (itching and burning). She has not further zoster lesions that are apparent, however there is this very well demarcated erythematous patch directly overlying her breast skin fold.  -Given Rx for Nystatin cream to use bid. Explained to her that she should use this for ~7 days or until the redness and symptoms have subsided. Explained that she is not to use it for longer than 10-14 days if necessary.  -Discontinue Valtrex. This serves no purpose at this time.  -Lidocaine patch for possible post-herpetic neuralgia.  -RTC in 6-12 weeks to see PCP.

## 2015-05-11 NOTE — Assessment & Plan Note (Signed)
Symptoms resolved s/p 1 dose of fosfomycin. No UA today.

## 2015-05-11 NOTE — Assessment & Plan Note (Signed)
This appears to have been completely resolved. No open or crusted lesions noted in the ~T6 dermatome on the left side of her chest. There does appear to be an erythematous, well demarcated are under the left breast that is quite consistent with intertrigo. Please see A&P for intertrigo. -Discussed lack of utility in continuing Valtrex, she will stop taking this. -Re-ordered Lidocaine patches (re-checked patient's phone number, [phone]), as I would believe that she continues to have some post-herpetic neuralgia, however, I feel most of her discomfort is related to intertrigo.

## 2015-05-11 NOTE — Progress Notes (Signed)
Subjective:   Patient ID: Jill Hancock female   DOB: [birth year] 74 y.o.   MRN: [id number]  HPI: [name] is a 73 y.o. female w/ PMHx of HTN, previous CVA (2013), and vertigo, presents to the clinic today for an acute visit for shingles. Patient has been seen in the clinic recently for this and has been taking Valtrex (despite Dr. Ala Dach explaining it isn't necessary) for some time. She states she has continued to have pain that she describes as burning and itching in the left T6 dermatome that she states is sometimes worse with anxiety. She never received her Lidocaine patches as it seems there was an issue with her phone number in the chart. She otherwise denies any other issues.    Current Outpatient Prescriptions  Medication Sig Dispense Refill  . alendronate (FOSAMAX) 70 MG tablet Take 1 tablet (70 mg total) by mouth every 7 (seven) days. Take with full glass of water on empty stomach, stay upright after taking 7 tablet 1  . amLODipine (NORVASC) 5 MG tablet Take 1 tablet (5 mg total) by mouth daily. 90 tablet 5  . aspirin 81 MG tablet Take 1 tablet (81 mg total) by mouth daily.    . diazepam (VALIUM) 2 MG tablet Take 0.5 tablets (1 mg total) by mouth at bedtime as needed for anxiety. (Patient taking differently: Take 1 mg by mouth at bedtime as needed for anxiety. ) 30 tablet 0  . diclofenac sodium (VOLTAREN) 1 % GEL Apply 2 g topically 4 (four) times daily. 1 Tube 1  . esomeprazole (NEXIUM) 20 MG capsule Take 20 mg by mouth daily before breakfast.    . FLUoxetine (PROZAC) 20 MG capsule Take 1 capsule (20 mg total) by mouth daily. 30 capsule 1  . fosfomycin (MONUROL) 3 G PACK Take 3 g by mouth once. 1 packet 0  . KLOR-CON 10 10 MEQ tablet TAKE 1 TABLET BY MOUTH EVERY DAY 90 tablet 0  . lidocaine (LIDODERM) 5 % Place 1 patch onto the skin daily. Remove & Discard patch within 12 hours or as directed by MD 30 patch 0  . lisinopril (PRINIVIL,ZESTRIL) 40 MG tablet Take 1 tablet  (40 mg total) by mouth daily. 90 tablet 5  . meclizine (ANTIVERT) 25 MG tablet TAKE 1 TABLET BY MOUTH 3 TIMES DAILY AS NEEDED FOR DIZZINESS. 90 tablet 0  . nystatin cream (MYCOSTATIN) Apply 1 application topically 2 (two) times daily. 30 g 0  . pravastatin (PRAVACHOL) 20 MG tablet Take 1 tablet (20 mg total) by mouth every other day. 90 tablet 5  . valACYclovir (VALTREX) 500 MG tablet TAKE 1 TABLET BY MOUTH 3 TIMES A DAY 21 tablet 0   No current facility-administered medications for this visit.   Review of Systems  General: Positive for itching/burning on left chest. Denies fever, diaphoresis, appetite change, and fatigue.  Respiratory: Denies SOB, cough, and wheezing.   Cardiovascular: Denies chest pain and palpitations.  Gastrointestinal: Denies nausea, vomiting, abdominal pain, and diarrhea Musculoskeletal: Denies myalgias, arthralgias, back pain, and gait problem.  Neurological: Denies dizziness, syncope, weakness, lightheadedness, and headaches.  Psychiatric/Behavioral: Denies mood changes, sleep disturbance, and agitation.   Objective:   Physical Exam: Filed Vitals:   05/11/15 1344  BP: 145/75  Pulse: 75  Temp: 97.8 F (36.6 C)  TempSrc: Oral  Height:  (1.626 m)  Weight: 161 lb 3.2 oz (73.12 kg)  SpO2: 98%    General: Elderly white female, alert, cooperative, NAD.  HEENT: PERRL, EOMI. Moist mucus membranes Neck: Full range of motion without pain, supple, no lymphadenopathy or carotid bruits Lungs: Clear to ascultation bilaterally, normal work of respiration, no wheezes, rales, rhonchi Heart: RRR, no murmurs, gallops, or rubs Abdomen: Soft, non-tender, non-distended, BS + Extremities: No cyanosis, clubbing, or edema Neurologic: Alert & oriented x3, cranial nerves II-XII intact, strength grossly intact, sensation intact to light touch Skin: Erythematous area under left breast, well demarcated, prurutic with mild burning. No open or crusted lesions.    Assessment &  Plan:   Please see problem based assessment and plan.

## 2015-05-11 NOTE — Patient Instructions (Signed)
1. Please return in 6-12 weeks to see your PCP.   2. Please take all medications as previously prescribed with the following changes:  Start using Nystatin cream on red area under breast. This will be sent to your pharmacy.   I have reordered Lidocaine patches for you.   3. If you have worsening of your symptoms or new symptoms arise, please call the clinic [phone]), or go to the ER immediately if symptoms are severe.

## 2015-05-12 NOTE — Progress Notes (Signed)
Internal Medicine Clinic Attending  Case discussed with Dr. Jones at the time of the visit.  We reviewed the resident's history and exam and pertinent patient test results.  I agree with the assessment, diagnosis, and plan of care documented in the resident's note.  

## 2015-06-07 ENCOUNTER — Encounter: Payer: Self-pay | Admitting: Neurology

## 2015-06-07 ENCOUNTER — Ambulatory Visit (INDEPENDENT_AMBULATORY_CARE_PROVIDER_SITE_OTHER): Payer: Commercial Managed Care - HMO | Admitting: Neurology

## 2015-06-07 VITALS — BP 120/77 | HR 86 | Ht 62.0 in | Wt 158.2 lb

## 2015-06-07 DIAGNOSIS — H8110 Benign paroxysmal vertigo, unspecified ear: Secondary | ICD-10-CM

## 2015-06-07 DIAGNOSIS — G2581 Restless legs syndrome: Secondary | ICD-10-CM

## 2015-06-07 DIAGNOSIS — R42 Dizziness and giddiness: Secondary | ICD-10-CM

## 2015-06-07 DIAGNOSIS — F411 Generalized anxiety disorder: Secondary | ICD-10-CM | POA: Diagnosis not present

## 2015-06-07 DIAGNOSIS — I6990 Unspecified sequelae of unspecified cerebrovascular disease: Secondary | ICD-10-CM | POA: Diagnosis not present

## 2015-06-07 MED ORDER — GABAPENTIN 300 MG PO CAPS: 300.0000 mg | ORAL_CAPSULE | Freq: Every day | ORAL | Status: DC

## 2015-06-07 NOTE — Patient Instructions (Signed)
I had a long conversation with the patient and her husband regarding her remote lacunar infarct, new complaints of right leg pain restless leg syndrome, vertigo and answered questions. I recommend she stay on aspirin for stroke prevention and maintain strict control of hypertension with blood pressure goal below 130/90 and lipids with LDL cholesterol goal below 70 mg percent. Plan to check MRI scan of the brain with MRA of the brain and neck to evaluate for any interval strokes as well as new complaints of vertigo. Referral to vestibular rehabilitation for dizziness exercises. Trial of gabapentin 300 mg at night for restless leg symptoms and she may use an additional dose during the day if necessary. She will return for follow-up in 3 months or call earlier if necessary.  Vertigo Vertigo means you feel like you or your surroundings are moving when they are not. Vertigo can be dangerous if it occurs when you are at work, driving, or performing difficult activities.  CAUSES  Vertigo occurs when there is a conflict of signals sent to your brain from the visual and sensory systems in your body. There are many different causes of vertigo, including:  Infections, especially in the inner ear.  A bad reaction to a drug or misuse of alcohol and medicines.  Withdrawal from drugs or alcohol.  Rapidly changing positions, such as lying down or rolling over in bed.  A migraine headache.  Decreased blood flow to the brain.  Increased pressure in the brain from a head injury, infection, tumor, or bleeding. SYMPTOMS  You may feel as though the world is spinning around or you are falling to the ground. Because your balance is upset, vertigo can cause nausea and vomiting. You may have involuntary eye movements (nystagmus). DIAGNOSIS  Vertigo is usually diagnosed by physical exam. If the cause of your vertigo is unknown, your caregiver may perform imaging tests, such as an MRI scan (magnetic resonance  imaging). TREATMENT  Most cases of vertigo resolve on their own, without treatment. Depending on the cause, your caregiver may prescribe certain medicines. If your vertigo is related to body position issues, your caregiver may recommend movements or procedures to correct the problem. In rare cases, if your vertigo is caused by certain inner ear problems, you may need surgery. HOME CARE INSTRUCTIONS   Follow your caregiver's instructions.  Avoid driving.  Avoid operating heavy machinery.  Avoid performing any tasks that would be dangerous to you or others during a vertigo episode.  Tell your caregiver if you notice that certain medicines seem to be causing your vertigo. Some of the medicines used to treat vertigo episodes can actually make them worse in some people. SEEK IMMEDIATE MEDICAL CARE IF:   Your medicines do not relieve your vertigo or are making it worse.  You develop problems with talking, walking, weakness, or using your arms, hands, or legs.  You develop severe headaches.  Your nausea or vomiting continues or gets worse.  You develop visual changes.  A family member notices behavioral changes.  Your condition gets worse. MAKE SURE YOU:  Understand these instructions.  Will watch your condition.  Will get help right away if you are not doing well or get worse.   This information is not intended to replace advice given to you by your health care provider. Make sure you discuss any questions you have with your health care provider.   Document Released: 03/29/2005 Document Revised: 09/11/2011 Document Reviewed: 10/12/2014 Elsevier Interactive Patient Education 2016 [employer] Inc[employer] Restless Legs  Syndrome Restless legs syndrome is a condition that causes uncomfortable feelings or sensations in the legs, especially while sitting or lying down. The sensations usually cause an overwhelming urge to move the legs. The arms can also sometimes be affected. The condition can  range from mild to severe. The symptoms often interfere with a person's ability to sleep. CAUSES The cause of this condition is not known. RISK FACTORS This condition is more likely to develop in:  People who are older than age 39.  Pregnant women. In general, restless legs syndrome is more common in women than in men.  People who have a family history of the condition.  People who have certain medical conditions, such as iron deficiency, kidney disease, Parkinson disease, or nerve damage.  People who take certain medicines, such as medicines for high blood pressure, nausea, colds, allergies, depression, and some heart conditions. SYMPTOMS The main symptom of this condition is uncomfortable sensations in the legs. These sensations may be:  Described as pulling, tingling, prickling, throbbing, crawling, or burning.  Worse while you are sitting or lying down.  Worse during periods of rest or inactivity.  Worse at night, often interfering with your sleep.  Accompanied by a very strong urge to move your legs.  Temporarily relieved by movement of your legs. The sensations usually affect both sides of the body. The arms can also be affected, but this is rare. People who have this condition often have tiredness during the day because of their lack of sleep at night. DIAGNOSIS This condition may be diagnosed based on your description of the symptoms. You may also have tests, including blood tests, to check for other conditions that may lead to your symptoms. In some cases, you may be asked to spend some time in a sleep lab so your sleeping can be monitored. TREATMENT Treatment for this condition is focused on managing the symptoms. Treatment may include:  Self-help and lifestyle changes.  Medicines. HOME CARE INSTRUCTIONS  Take medicines only as directed by your health care provider.  Try these methods to get temporary relief from the uncomfortable sensations:  Massage your  legs.  Walk or stretch.  Take a cold or hot bath.  Practice good sleep habits. For example, go to bed and get up at the same time every day.  Exercise regularly.  Practice ways of relaxing, such as yoga or meditation.  Avoid caffeine and alcohol.  Do not use any tobacco products, including cigarettes, chewing tobacco, or electronic cigarettes. If you need help quitting, ask your health care provider.  Keep all follow-up visits as directed by your health care provider. This is important. SEEK MEDICAL CARE IF: Your symptoms do not improve with treatment, or they get worse.   This information is not intended to replace advice given to you by your health care provider. Make sure you discuss any questions you have with your health care provider.   Document Released: 06/09/2002 Document Revised: 11/03/2014 Document Reviewed: 06/15/2014 Elsevier Interactive Patient Education [employer]

## 2015-06-07 NOTE — Progress Notes (Signed)
[facility] [address] [place]. [place] [address] [phone]       OFFICE FOLLOW-UP NOTE  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 I have personally reviewed her MRI and MRA films. 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 She had several follow-up appointments  with meafter that but missed. Them. She states that she continues to have vertigo and dizziness which occurs at least once a week. He seems remote pronounced when she is anxious. This may last up to 10 minutes and she describes this as a head spinning sensation accompanied by nausea but no vomiting. This may at times more pronounced when she is trying to turn on her side in the bed. She is fallen twice due to her dizziness but fortunately has not had major injury. She denies any ringing in ears or decreased hearing. She denies any others focal stroke like deficits. However she complains of new leg pain mainly at night. This is aching in quality with occasional cramp. This does not seem to be related to exertion but is more at night and she does describe a feeling of restlessness as well as some relief after she gets up and walks. The husband feels that this may be happening during the daytime as well now. She also complains of increasing anxiety. She was given a prescription of Prozac by her primary care physician but apparently  she is not taking it on a regular basis and use it only as when necessary and does admit that it may help her. She has not been doing any dizziness exercises and has not seen dizziness rehabilitation specialist for a while.  ROS:   14 system review of systems is positive for  dizziness, vertigo, gait balance difficulties, anxiety, leg pain, cramps, aching and all other systems negative  PMH:  Past Medical History  Diagnosis Date  . Hypertension   . Vertigo   . Stroke [facility]) 02/2012    MRI revealed at least 3 subcentimeter acute infarctions with one area of subacute infarction in widely disparate vascular territories including territory of left cerebellum and around right caudate nucleus along with widespread lacunar infarcts, chronic microvascular ischemic change, and numerous microbleeds suggesting long standing hypertensive cerebrovascular disease.  MRA confirmed intracranial  . Hyperglycemia   . Multiple cerebral infarctions [facility]) august 2013  . Unsteady gait   . Hypokalemia     Social History:  Social History   Social History  . Marital Status: Married    Spouse Name: N/A  . Number of Children: 5  . Years of Education: N/A   Occupational History  . RN    Social History Main Topics  . Smoking status: Never Smoker   . Smokeless tobacco: Not on file  . Alcohol Use: No  . Drug Use: No  . Sexual Activity: Not on file   Other Topics Concern  . Not on file   Social History Narrative    Medications:   Current Outpatient Prescriptions  on File Prior to Visit  Medication Sig Dispense Refill  . amLODipine (NORVASC) 5 MG tablet Take 1 tablet (5 mg total) by mouth daily. 90 tablet 5  . aspirin 81 MG tablet Take 1 tablet (81 mg total) by mouth daily.    . diazepam (VALIUM) 2 MG tablet Take 0.5 tablets (1 mg total) by mouth at bedtime as needed for anxiety. (Patient taking differently: Take 1 mg by mouth at bedtime as needed for anxiety. ) 30 tablet 0  . diclofenac sodium  (VOLTAREN) 1 % GEL Apply 2 g topically 4 (four) times daily. 1 Tube 1  . esomeprazole (NEXIUM) 20 MG capsule Take 20 mg by mouth daily before breakfast.    . FLUoxetine (PROZAC) 20 MG capsule Take 1 capsule (20 mg total) by mouth daily. 30 capsule 1  . lidocaine (LIDODERM) 5 % Place 1 patch onto the skin daily. Remove & Discard patch within 12 hours or as directed by MD 30 patch 0  . lisinopril (PRINIVIL,ZESTRIL) 40 MG tablet Take 1 tablet (40 mg total) by mouth daily. 90 tablet 5  . meclizine (ANTIVERT) 25 MG tablet TAKE 1 TABLET BY MOUTH 3 TIMES DAILY AS NEEDED FOR DIZZINESS. 90 tablet 0  . nystatin cream (MYCOSTATIN) Apply 1 application topically 2 (two) times daily. 30 g 0  . pravastatin (PRAVACHOL) 20 MG tablet Take 1 tablet (20 mg total) by mouth every other day. 90 tablet 5   No current facility-administered medications on file prior to visit.    Allergies:   Allergies  Allergen Reactions  . Demerol [Meperidine] Nausea And Vomiting  . Macrobid [Nitrofurantoin Macrocrystal]   . Penicillins Rash    Physical Exam General: well developed, well nourished elderly Caucasian lady, seated, in no evident distress Head: head normocephalic and atraumatic.  Neck: supple with no carotid or supraclavicular bruits Cardiovascular: regular rate and rhythm, no murmurs Musculoskeletal: no deformity Skin:  no rash/petichiae Vascular:  Normal pulses all extremities Filed Vitals:   06/07/15 1023  BP: 120/77  Pulse: 86   Neurologic Exam Mental Status: Awake and fully alert. Oriented to place and time. Recent and remote memory intact. Attention span, concentration and fund of knowledge appropriate. Mood and affect appropriate.  Cranial Nerves: Fundoscopic exam reveals sharp disc margins. Pupils equal, briskly reactive to light. Extraocular movements full without nystagmus. Visual fields full to confrontation. Hearing intact. Facial sensation intact. Face, tongue, palate moves normally and  symmetrically.  Motor: Normal bulk and tone. Normal strength in all tested extremity muscles. Sensory.: intact to touch ,pinprick .position and vibratory sensation.  Coordination: Rapid alternating movements normal in all extremities. Finger-to-nose and heel-to-shin performed accurately bilaterally. Head shaking produces subjective dizziness but no objective nystagmus noted. Fukuda stepping test is positive with patient moving several steps off a base. Hallpike maneuver was not done. Gait and Station: Arises from chair without difficulty. Stance is normal. Gait demonstrates normal stride length and balance . Able to heel, toe and tandem walk with moderate difficulty.  Reflexes: 1+ and symmetric. Toes downgoing.     ASSESSMENT: 74 yearfemale w/ PMHx of HTN, previous CVA (2013), and vertigo, she presents today with new complaints of leg pain and discomfort likely from restless leg syndrome as well as intermittent vertigo likely combination of cerebrovascular disease and positional vertigo. Chronic underlying mild anxiety.      PLAN: I had a long conversation with the patient and her husband regarding her remote lacunar infarct, new complaints of right leg pain restless leg syndrome,  vertigo and answered questions. I recommend she stay on aspirin for stroke prevention and maintain strict control of hypertension with blood pressure goal below 130/90 and lipids with LDL cholesterol goal below 70 mg percent. Plan to check MRI scan of the brain with MRA of the brain and neck to evaluate for any interval strokes as well as new complaints of vertigo. Referral to vestibular rehabilitation for dizziness exercises. Trial of gabapentin 300 mg at night for restless leg symptoms and she may use an additional dose during the day if necessary. This was a prolonged visit requiring 60 minutes with extensive history taking, review of data, imaging studies, medical decision making of high complexity and extensive  counseling and discussion with patient   She will return for follow-up in 3 months or call earlier if necessary.  Delia Heady, MD  Note: This document was prepared with digital dictation and possible smart phrase technology. Any transcriptional errors that result from this process are unintentional

## 2015-06-11 ENCOUNTER — Other Ambulatory Visit: Payer: Self-pay | Admitting: Neurology

## 2015-06-11 ENCOUNTER — Telehealth: Payer: Self-pay | Admitting: Neurology

## 2015-06-11 MED ORDER — GABAPENTIN 100 MG PO CAPS: 300.0000 mg | ORAL_CAPSULE | Freq: Every day | ORAL | Status: DC

## 2015-06-11 NOTE — Telephone Encounter (Signed)
Patient called back, needs nurse to call back about lowering dose of gabapentine (NEURONTIN), is supposed to lower dose today per Pharmacist and patient states "the nurse leaves at 12pm".

## 2015-06-11 NOTE — Telephone Encounter (Signed)
I returned a phone call from patient who informed me that 300 mg of gabapentin at night is making her too sleepy during the day. I recommend she change it to 100 mg at night. She agreed with the plan. I changed the prescription.

## 2015-06-11 NOTE — Telephone Encounter (Signed)
Pt called and would like to know if the dosage for gabapentin (NEURONTIN) 300 MG capsule can be lowered? She says she takes one at night and it makes her sleepy through the next day. Please call and advise [phone]

## 2015-06-22 ENCOUNTER — Other Ambulatory Visit: Payer: Self-pay

## 2015-06-24 ENCOUNTER — Encounter: Payer: Self-pay | Admitting: Internal Medicine

## 2015-06-24 ENCOUNTER — Ambulatory Visit (INDEPENDENT_AMBULATORY_CARE_PROVIDER_SITE_OTHER): Payer: Commercial Managed Care - HMO | Admitting: Internal Medicine

## 2015-06-24 VITALS — BP 135/73 | HR 71 | Temp 98.0°F | Ht 64.0 in | Wt 162.3 lb

## 2015-06-24 DIAGNOSIS — Z91419 Personal history of unspecified adult abuse: Secondary | ICD-10-CM | POA: Diagnosis not present

## 2015-06-24 DIAGNOSIS — F32A Depression, unspecified: Secondary | ICD-10-CM

## 2015-06-24 DIAGNOSIS — T7491XD Unspecified adult maltreatment, confirmed, subsequent encounter: Secondary | ICD-10-CM

## 2015-06-24 DIAGNOSIS — F329 Major depressive disorder, single episode, unspecified: Secondary | ICD-10-CM | POA: Diagnosis not present

## 2015-06-24 DIAGNOSIS — G2581 Restless legs syndrome: Secondary | ICD-10-CM

## 2015-06-24 DIAGNOSIS — I10 Essential (primary) hypertension: Secondary | ICD-10-CM

## 2015-06-24 MED ORDER — GABAPENTIN 100 MG PO CAPS: 200.0000 mg | ORAL_CAPSULE | Freq: Every day | ORAL | Status: DC

## 2015-06-24 MED ORDER — FLUOXETINE HCL 20 MG PO CAPS: 20.0000 mg | ORAL_CAPSULE | Freq: Every day | ORAL | Status: DC
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
## 2015-06-24 NOTE — Patient Instructions (Addendum)
[name], it was a pleasure to to see you today. As always, if you ever feel unsafe, you can go to the emergency room. We are also decreasing your dose of gabapentin for your restless legs to 200 mg a night to help with drowsiness in the morning. It seems your depression is also well controlled, so we will not make any changes to the Prozac dose.

## 2015-06-25 NOTE — Assessment & Plan Note (Signed)
Assessment: Gabapentin prescribed by Dr. Pearlean BrownieSethi is effective for Jill Hancock, but it appears to dosage may be too high, causing morning somnolence.  Plan: - Decrease Gabapentin dose to 200 mg qhs
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
## 2015-06-25 NOTE — Assessment & Plan Note (Signed)
Assessment: Blood pressure is well-controlled and at goal of <140/90. She reports some leg swelling on amlodipine but is not interested in changing medications.   Plan: - Refill amlodipine 5 mg daily

## 2015-06-25 NOTE — Assessment & Plan Note (Signed)
Assessment: Patient's depression is well-controlled based on PHQ-9. She is tolerating the fluoxetine well. No suicidal ideation.  Plan: - Refill prozac 20 mg dialy

## 2015-06-30 ENCOUNTER — Ambulatory Visit: Payer: Commercial Managed Care - HMO | Admitting: Physical Therapy

## 2015-06-30 NOTE — Progress Notes (Signed)
Internal Medicine Clinic Attending  I saw and evaluated the patient.  I personally confirmed the key portions of the history and exam documented by Dr. Ford and I reviewed pertinent patient test results.  The assessment, diagnosis, and plan were formulated together and I agree with the documentation in the resident's note. 

## 2015-07-08 ENCOUNTER — Other Ambulatory Visit: Payer: Self-pay

## 2015-07-08 ENCOUNTER — Inpatient Hospital Stay: Admission: RE | Admit: 2015-07-08 | Payer: Self-pay | Source: Ambulatory Visit

## 2015-07-15 ENCOUNTER — Ambulatory Visit: Payer: Commercial Managed Care - HMO

## 2015-07-19 ENCOUNTER — Ambulatory Visit
Admission: RE | Admit: 2015-07-19 | Discharge: 2015-07-19 | Disposition: A | Discharge status: Home / Self Care | Payer: Commercial Managed Care - HMO | Source: Ambulatory Visit | Attending: Neurology | Admitting: Neurology

## 2015-07-19 DIAGNOSIS — I6990 Unspecified sequelae of unspecified cerebrovascular disease: Secondary | ICD-10-CM

## 2015-07-19 DIAGNOSIS — R42 Dizziness and giddiness: Secondary | ICD-10-CM

## 2015-07-19 MED ORDER — GADOBENATE DIMEGLUMINE 529 MG/ML IV SOLN: 14.0000 mL | Freq: Once | INTRAVENOUS | Status: DC | PRN

## 2015-07-26 ENCOUNTER — Ambulatory Visit: Payer: Commercial Managed Care - HMO | Attending: Neurology | Admitting: Physical Therapy

## 2015-07-26 ENCOUNTER — Telehealth: Payer: Self-pay | Admitting: Neurology

## 2015-07-26 NOTE — Telephone Encounter (Signed)
Patient called to request results of MRI/MRA

## 2015-07-26 NOTE — Telephone Encounter (Signed)
Rn called patient about her MRI and MRA results. Patient stated it was done last week and she needs the results. Pt also wanted a earlier appt than September 17, 2015. Pt was last seen December 2016 and wanted a earlier appt. Rn ask patient if she was having any symptoms. Pt stated she is fine her daughter wanted a earlier visit. Rn explain the last office note in 06/2015 stated three month follow up, and she was schedule in December for March 2017. Rn stated a message will be sent to Dr.Sethi.

## 2015-07-26 NOTE — Telephone Encounter (Signed)
Patient also requests to be seen sooner than 09/16/15 (1st available is 09/28/15), patient put on cancellation/waiting list. Patient states "it's been a week ago since they did the MRI, don't they have the results yet"?

## 2015-07-27 NOTE — Telephone Encounter (Signed)
Patient called back to check status of message sent to Dr. Pearlean Brownie regarding seeing her sooner than 09/16/15.

## 2015-07-27 NOTE — Telephone Encounter (Signed)
Kindly call patient and let her know that MR scans show no acute or worrisome abnormality. There are chronic changes of hardening of tiny blood vessels and microhemorrhages which appear more advanced than 2013 but this is expected. 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 Nothing to worry about.  She does not need to see me sooner than March unless she has an active issue

## 2015-07-28 ENCOUNTER — Telehealth: Payer: Self-pay | Admitting: Internal Medicine

## 2015-07-28 ENCOUNTER — Other Ambulatory Visit: Payer: Self-pay | Admitting: Internal Medicine

## 2015-07-28 NOTE — Telephone Encounter (Signed)
Unable to leave vm on patients phone. Was calling to give her the MRI results.

## 2015-07-28 NOTE — Telephone Encounter (Signed)
Call to patient to confirm appointment for 07/29/15 at 2:15 no answer

## 2015-07-28 NOTE — Telephone Encounter (Signed)
Rn call patient and gave her Dr.Sethi interpretation of the MRI below dated 07/27/2015. Pt verbalized understanding. Rn also stated per Dr.,Sethi he does not need to see her sooner than her March appt.

## 2015-07-29 ENCOUNTER — Encounter: Payer: Self-pay | Admitting: Internal Medicine

## 2015-07-29 ENCOUNTER — Ambulatory Visit (INDEPENDENT_AMBULATORY_CARE_PROVIDER_SITE_OTHER): Payer: Commercial Managed Care - HMO | Admitting: Internal Medicine

## 2015-07-29 VITALS — BP 121/67 | HR 68 | Temp 98.2°F | Ht 64.0 in | Wt 162.4 lb

## 2015-07-29 DIAGNOSIS — H8110 Benign paroxysmal vertigo, unspecified ear: Secondary | ICD-10-CM

## 2015-07-29 DIAGNOSIS — Z0000 Encounter for general adult medical examination without abnormal findings: Secondary | ICD-10-CM

## 2015-07-29 DIAGNOSIS — K219 Gastro-esophageal reflux disease without esophagitis: Secondary | ICD-10-CM | POA: Diagnosis not present

## 2015-07-29 DIAGNOSIS — R739 Hyperglycemia, unspecified: Secondary | ICD-10-CM

## 2015-07-29 DIAGNOSIS — F329 Major depressive disorder, single episode, unspecified: Secondary | ICD-10-CM

## 2015-07-29 DIAGNOSIS — I10 Essential (primary) hypertension: Secondary | ICD-10-CM | POA: Diagnosis not present

## 2015-07-29 DIAGNOSIS — G2581 Restless legs syndrome: Secondary | ICD-10-CM

## 2015-07-29 DIAGNOSIS — F419 Anxiety disorder, unspecified: Secondary | ICD-10-CM

## 2015-07-29 DIAGNOSIS — Z91419 Personal history of unspecified adult abuse: Secondary | ICD-10-CM

## 2015-07-29 DIAGNOSIS — E785 Hyperlipidemia, unspecified: Secondary | ICD-10-CM

## 2015-07-29 DIAGNOSIS — T7491XD Unspecified adult maltreatment, confirmed, subsequent encounter: Secondary | ICD-10-CM

## 2015-07-29 DIAGNOSIS — E876 Hypokalemia: Secondary | ICD-10-CM | POA: Diagnosis not present

## 2015-07-29 DIAGNOSIS — F32A Depression, unspecified: Secondary | ICD-10-CM

## 2015-07-29 MED ORDER — DIAZEPAM 2 MG PO TABS: 1.0000 mg | ORAL_TABLET | Freq: Every evening | ORAL | Status: DC | PRN

## 2015-07-29 MED ORDER — AMLODIPINE BESYLATE 5 MG PO TABS: 5.0000 mg | ORAL_TABLET | Freq: Every day | ORAL | Status: DC

## 2015-07-29 MED ORDER — MECLIZINE HCL 25 MG PO TABS: 25.0000 mg | ORAL_TABLET | Freq: Every day | ORAL | Status: DC | PRN

## 2015-07-29 MED ORDER — FLUOXETINE HCL 20 MG PO CAPS: 20.0000 mg | ORAL_CAPSULE | Freq: Every day | ORAL | Status: DC

## 2015-07-29 MED ORDER — PRAVASTATIN SODIUM 20 MG PO TABS: 20.0000 mg | ORAL_TABLET | ORAL | Status: DC

## 2015-07-29 MED ORDER — GABAPENTIN 100 MG PO CAPS: 100.0000 mg | ORAL_CAPSULE | Freq: Every day | ORAL | Status: DC

## 2015-07-29 MED ORDER — POTASSIUM CHLORIDE ER 10 MEQ PO TBCR: 10.0000 meq | EXTENDED_RELEASE_TABLET | Freq: Every day | ORAL | Status: DC

## 2015-07-29 MED ORDER — ESOMEPRAZOLE MAGNESIUM 20 MG PO CPDR: 20.0000 mg | DELAYED_RELEASE_CAPSULE | Freq: Every day | ORAL | Status: DC

## 2015-07-29 MED ORDER — LISINOPRIL 40 MG PO TABS: 40.0000 mg | ORAL_TABLET | Freq: Every day | ORAL | Status: DC
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
## 2015-07-29 NOTE — Patient Instructions (Signed)
Jill Hancock, it was a joy seeing you today. We have refilled your medications through [facility]. Let us know if there are any issues.  I will try to remember to call you about your results, but please forgive me if I forget. Call me if I forget.  I will provide stool cards for you to take home and bring back. We are doing this instead of a colonoscopy.  Do not take the potassium supplement more than once a week.  I will see you in two months.  Happy Valentine's Day.

## 2015-07-30 LAB — BMP8+ANION GAP
Anion Gap: 21 mmol/L — ABNORMAL HIGH (ref 10.0–18.0)
BUN/Creatinine Ratio: 17 (ref 11–26)
BUN: 14 mg/dL (ref 8–27)
CALCIUM: 9.6 mg/dL (ref 8.7–10.3)
CHLORIDE: 95 mmol/L — AB (ref 96–106)
CO2: 24 mmol/L (ref 18–29)
Creatinine, Ser: 0.83 mg/dL (ref 0.57–1.00)
GFR calc non Af Amer: 69 mL/min/{1.73_m2} (ref >59)
GFR, EST AFRICAN AMERICAN: 80 mL/min/{1.73_m2} (ref >59)
GLUCOSE: 82 mg/dL (ref 65–99)
POTASSIUM: 3.5 mmol/L (ref 3.5–5.2)
SODIUM: 140 mmol/L (ref 134–144)

## 2015-07-30 LAB — HEMOGLOBIN A1C
Est. average glucose Bld gHb Est-mCnc: 128 mg/dL
Hgb A1c MFr Bld: 6.1 % — ABNORMAL HIGH (ref 4.8–5.6)

## 2015-08-01 NOTE — Assessment & Plan Note (Signed)
A: Patient's depression is well controlled based on PHQ-9. She is tolerating fluoxetine. Well. No SI P: Refill fluoxetine 20 mg daily

## 2015-08-01 NOTE — Assessment & Plan Note (Signed)
A: Patient denies any symptoms of restless leg syndrome. Has decreased her dose of gabapentin to 100 mg qhs P: Refill gabapentin 100 mg qhs.

## 2015-08-01 NOTE — Assessment & Plan Note (Signed)
A: Patient is tolerating pravastatin well. P: Refill Pravastatin 20 mg daily
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
## 2015-08-01 NOTE — Assessment & Plan Note (Signed)
S: Patient denies any symptoms of GERD and is tolerating medications.  A: Patient is responding well to PPI P: Refill nexium

## 2015-08-01 NOTE — Assessment & Plan Note (Signed)
A: Unclear if "shakiness" after loose stools is due to hypokalemia. BMET today shows potassium of 3.5 P: Prescribe K-dur 10 mEq only be taken as needed.

## 2015-08-01 NOTE — Assessment & Plan Note (Signed)
A: Patient has past A1c of 6.2 in 2015. She denies any polyuria or polydipsia P: A1c check today is 6.1. Encouraged a healthy diet and exercise.

## 2015-08-01 NOTE — Assessment & Plan Note (Signed)
Assessment: Patient takes diazepam no more than once a week for combined anxiety/dizziness episodes. Overall Fluoxetine has done well in controlling her anxiety and depression. Plan: Refill Diazepam (30 pills only), which she indicates will last her a year. Refill Fluoxetine.

## 2015-08-01 NOTE — Assessment & Plan Note (Signed)
S: Patient tolerating medications well and taking as prescribed  A: Patient at goal <140/90. 121/67 today.   Plan: - Refill Amlodipine 5 mg daily, Lisinopril 40 mg daily

## 2015-08-02 NOTE — Progress Notes (Signed)
Internal Medicine Clinic Attending  Case discussed with Dr. Ford at the time of the visit.  We reviewed the resident's history and exam and pertinent patient test results.  I agree with the assessment, diagnosis, and plan of care documented in the resident's note.  

## 2015-08-02 NOTE — Addendum Note (Signed)
Addended by: Erlinda Hong T on: 08/02/2015 03:00 PM   Modules accepted: Level of Service

## 2015-08-06 ENCOUNTER — Telehealth: Payer: Self-pay | Admitting: Internal Medicine

## 2015-08-06 NOTE — Telephone Encounter (Signed)
Pt stated she has not received her medication. I called [facility] - they have received rxs; stated there's a high co-pay on certain medications which have not been paid. Called pt back - pt informed; wanted to know which ones; told her she needed to called [facility] herself to get the details.

## 2015-08-06 NOTE — Telephone Encounter (Signed)
Pt requesting all meds refilled @ [facility].

## 2015-08-09 NOTE — Telephone Encounter (Signed)
I called patient, who said that she would call [facility] again to get this straightened out. She reported having enough medication to last her at least a week. I told her to call the clinic again later this week if she's still having trouble.

## 2015-08-31 DIAGNOSIS — M25561 Pain in right knee: Secondary | ICD-10-CM | POA: Diagnosis not present

## 2015-08-31 DIAGNOSIS — M170 Bilateral primary osteoarthritis of knee: Secondary | ICD-10-CM | POA: Diagnosis not present

## 2015-08-31 DIAGNOSIS — M25562 Pain in left knee: Secondary | ICD-10-CM | POA: Diagnosis not present

## 2015-09-03 ENCOUNTER — Ambulatory Visit: Payer: Self-pay | Admitting: Internal Medicine

## 2015-09-15 ENCOUNTER — Ambulatory Visit: Payer: Self-pay | Admitting: Internal Medicine

## 2015-09-15 ENCOUNTER — Telehealth: Payer: Self-pay | Admitting: Neurology

## 2015-09-15 NOTE — Telephone Encounter (Signed)
Rn tried to call patient back but the phone rung and was busy. Pt has appt tomorrow on 09/16/2015 with Dr.Sethi.

## 2015-09-15 NOTE — Telephone Encounter (Signed)
Pt called

## 2015-09-15 NOTE — Telephone Encounter (Signed)
Pt called inquiring about MRI results. I asked her if this could wait until her appt tomorrow 09/16/15 but she said she wanted to speak with RN today.

## 2015-09-15 NOTE — Telephone Encounter (Signed)
Patient called again, thinks the nurse may have tried to call, phone rang one time.

## 2015-09-15 NOTE — Telephone Encounter (Signed)
Rn

## 2015-09-15 NOTE — Telephone Encounter (Signed)
Rn call patient back about her MRI. Pt stated she wants a copy of the MRi results. Rn stated she would need to sign a release form tomorrow to get a copy of the results. Pt will get copy tomorrow when she comes to her appt. Pt was told to check in at 1030am for 1100 appt.

## 2015-09-16 ENCOUNTER — Encounter: Payer: Self-pay | Admitting: Neurology

## 2015-09-16 ENCOUNTER — Ambulatory Visit (INDEPENDENT_AMBULATORY_CARE_PROVIDER_SITE_OTHER): Payer: Commercial Managed Care - HMO | Admitting: Neurology

## 2015-09-16 ENCOUNTER — Telehealth: Payer: Self-pay | Admitting: Internal Medicine

## 2015-09-16 VITALS — BP 121/76 | HR 79 | Ht 62.0 in | Wt 159.2 lb

## 2015-09-16 DIAGNOSIS — R42 Dizziness and giddiness: Secondary | ICD-10-CM | POA: Diagnosis not present

## 2015-09-16 NOTE — Progress Notes (Signed)
[facility] [address] [place]. [place] [id number] [phone]       OFFICE FOLLOW-UP NOTE  Jill. 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 I have personally reviewed her MRI and MRA films. 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 She had several follow-up appointments  with meafter that but missed. Them. She states that she continues to have vertigo and dizziness which occurs at least once a week. He seems remote pronounced when she is anxious. This may last up to 10 minutes and she describes this as a head spinning sensation accompanied by nausea but no vomiting. This may at times more pronounced when she is trying to turn on her side in the bed. She is fallen twice due to her dizziness but fortunately has not had major injury. She denies any ringing in ears or decreased hearing. She denies any others focal stroke like deficits. However she complains of new leg pain mainly at night. This is aching in quality with occasional cramp. This does not seem to be related to exertion but is more at night and she does describe a feeling of restlessness as well as some relief after she gets up and walks. The husband feels that this may be happening during the daytime as well now. She also complains of increasing anxiety. She was given a prescription of Prozac by her primary care physician but apparently  she is not taking it on a regular basis and use it only as when necessary and does admit that it may help her. She has not been doing any dizziness exercises and has not seen dizziness rehabilitation specialist for a while. Update 09/16/2015 : She returns for follow-up after last visit 3 months ago. She continues to do well without recurrent stroke or TIA symptoms. She continues to have problems with gait and balance but fortunately she is careful and has not had any falls. She states her blood pressure is well controlled and today it is 121/76. She had lipid profile checked a few weeks ago and LDL was in the 70s. She is tolerating pravastatin well without myalgias or arthralgias. She remains on aspirin and is tolerating well without bleeding or bruising. She states gabapentin seems to help her restless leg syndrome but she still has some leg cramps and pain. Her vertigo seems to settle now on of late she has not had migraines either. She is bothered by tone cartilage in the right knee and plans to get the [employer] insert soon. She denies significant memory problems or any recurrent stroke or TIA symptoms. She had follow-up MRI scan of the brain done on 07/19/15 which are personally reviewed shows is progressive changes of small vessel disease and microhemorrhages without any acute abnormality and old remote age cerebellar infarct. MRA of the brain shows stable moderate right posterior cerebral artery stenosis and hypoplastic left vertebral artery. MRA of the neck shows no significant carotid stenosis. I personally discuss imaging findings with the patient and answered her  questions. ROS:   14 system review of systems is positive for  dizziness, vertigo, gait balance difficulties, anxiety, leg pain, cramps, agitation, joint pain and swelling, restless leg and all other systems negative  PMH:  Past Medical History  Diagnosis Date  . Hypertension   . Vertigo   . Stroke [facility]) 02/2012    MRI revealed at least  3 subcentimeter acute infarctions with one area of subacute infarction in widely disparate vascular territories including territory of left cerebellum and around right caudate nucleus along with widespread lacunar infarcts, chronic microvascular ischemic change, and numerous microbleeds suggesting long standing hypertensive cerebrovascular disease.  MRA confirmed intracranial  . Hyperglycemia   . Multiple cerebral infarctions [facility]) august 2013  . Unsteady gait   . Hypokalemia     Social History:  Social History   Social History  . Marital Status: Married    Spouse Name: N/A  . Number of Children: 5  . Years of Education: N/A   Occupational History  . RN    Social History Main Topics  . Smoking status: Never Smoker   . Smokeless tobacco: Not on file  . Alcohol Use: No  . Drug Use: No  . Sexual Activity: Not on file   Other Topics Concern  . Not on file   Social History Narrative    Medications:   Current Outpatient Prescriptions on File Prior to Visit  Medication Sig Dispense Refill  . amLODipine (NORVASC) 5 MG tablet Take 1 tablet (5 mg total) by mouth daily. 90 tablet 5  . aspirin 81 MG tablet Take 1 tablet (81 mg total) by mouth daily.    . Calcium-Phosphorus-Vitamin D (CALCIUM/D3 ADULT GUMMIES PO) Take by mouth.    . diazepam (VALIUM) 2 MG tablet Take 0.5 tablets (1 mg total) by mouth at bedtime as needed for anxiety. 30 tablet 0  . esomeprazole (NEXIUM) 20 MG capsule Take 1 capsule (20 mg total) by mouth daily before breakfast. 90 capsule 5  . FLUoxetine (PROZAC) 20 MG capsule Take 1 capsule (20 mg total) by mouth daily. 90 capsule 5  . gabapentin (NEURONTIN) 100 MG capsule Take 1 capsule (100 mg total) by mouth at bedtime. 90 capsule 5  . lisinopril (PRINIVIL,ZESTRIL) 40 MG tablet Take 1 tablet (40 mg total) by mouth daily. 90 tablet 5  . meclizine (ANTIVERT) 25 MG tablet Take 1 tablet (25 mg total) by mouth daily as needed for dizziness. 30 tablet 1  . potassium  chloride (K-DUR) 10 MEQ tablet Take 1 tablet (10 mEq total) by mouth daily. 30 tablet 1  . pravastatin (PRAVACHOL) 20 MG tablet Take 1 tablet (20 mg total) by mouth every other day. 90 tablet 5   No current facility-administered medications on file prior to visit.    Allergies:   Allergies  Allergen Reactions  . Demerol [Meperidine] Nausea And Vomiting  . Macrobid [Nitrofurantoin Macrocrystal]   . Penicillins Rash    Physical Exam General: well developed, well nourished elderly Caucasian lady, seated, in no evident distress Head: head normocephalic and atraumatic.  Neck: supple with no carotid or supraclavicular bruits Cardiovascular: regular rate and rhythm, no murmurs Musculoskeletal: no deformity Skin:  no rash/petichiae Vascular:  Normal pulses all extremities Filed Vitals:   09/16/15 1055  BP: 121/76  Pulse: 79   Neurologic Exam Mental Status: Awake and fully alert. Oriented to place and time. Recent and remote memory intact. Attention span, concentration and fund of knowledge appropriate. Mood and affect appropriate.  Cranial Nerves:  Fundoscopic exam not done  . Pupils equal, briskly reactive to light. Extraocular movements full without nystagmus. Visual fields full to confrontation. Hearing intact. Facial sensation intact. Face, tongue, palate moves normally and symmetrically.  Motor: Normal bulk and tone. Normal strength in all tested extremity muscles. Sensory.: intact to touch ,pinprick .position and vibratory sensation.  Coordination: Rapid alternating movements normal in all extremities. Finger-to-nose and heel-to-shin performed accurately bilaterally. Head shaking produces subjective dizziness but no objective nystagmus noted. Fukuda stepping test is positive with patient moving several steps off a base. Hallpike maneuver was not done. Gait and Station: Arises from chair without difficulty. Stance is normal. Gait demonstrates normal stride length and balance . Able to  heel, toe and tandem walk with moderate difficulty.  Reflexes: 1+ and symmetric. Toes downgoing.     ASSESSMENT: 74 yearfemale w/ PMHx of HTN, previous CVA (2013), and vertigo, she presents today with new complaints of leg pain and discomfort likely from restless leg syndrome as well as intermittent vertigo likely combination of cerebrovascular disease and positional vertigo. Chronic underlying mild anxiety.      PLAN: I had a long d/w patient about her remote stroke, risk for recurrent stroke/TIAs, personally independently reviewed imaging studies and stroke evaluation results and answered questions.Continue aspirin 81 mg daily  for secondary stroke prevention and maintain strict control of hypertension with blood pressure goal below 130/90, diabetes with hemoglobin A1c goal below 6.5% and lipids with LDL cholesterol goal below 70 mg/dL. I also advised the patient to eat a healthy diet with plenty of whole grains, cereals, fruits and vegetables, exercise regularly and maintain ideal body weight. We also talked about fall and safety precautions .greater than 50% time during this 25 minute visit was spent on counseling and coordination of care. Followup in the future with stroke nurse practitioner in 6 months or call earlier if necessary Delia Heady, MD  Note: This document was prepared with digital dictation and possible smart phrase technology. Any transcriptional errors that result from this process are unintentional

## 2015-09-16 NOTE — Patient Instructions (Signed)
I had a long d/w patient about her remote stroke, risk for recurrent stroke/TIAs, personally independently reviewed imaging studies and stroke evaluation results and answered questions.Continue aspirin 81 mg daily  for secondary stroke prevention and maintain strict control of hypertension with blood pressure goal below 130/90, diabetes with hemoglobin A1c goal below 6.5% and lipids with LDL cholesterol goal below 70 mg/dL. I also advised the patient to eat a healthy diet with plenty of whole grains, cereals, fruits and vegetables, exercise regularly and maintain ideal body weight. We also talked about fall and safety precautions Followup in the future with stroke nurse practitioner in 6 months or call earlier if necessary Fall Prevention in the Home  Falls can cause injuries and can affect people from all age groups. There are many simple things that you can do to make your home safe and to help prevent falls. WHAT CAN I DO ON THE OUTSIDE OF MY HOME?  Regularly repair the edges of walkways and driveways and fix any cracks.  Remove high doorway thresholds.  Trim any shrubbery on the main path into your home.  Use bright outdoor lighting.  Clear walkways of debris and clutter, including tools and rocks.  Regularly check that handrails are securely fastened and in good repair. Both sides of any steps should have handrails.  Install guardrails along the edges of any raised decks or porches.  Have leaves, snow, and ice cleared regularly.  Use sand or salt on walkways during winter months.  In the garage, clean up any spills right away, including grease or oil spills. WHAT CAN I DO IN THE BATHROOM?  Use night lights.  Install grab bars by the toilet and in the tub and shower. Do not use towel bars as grab bars.  Use non-skid mats or decals on the floor of the tub or shower.  If you need to sit down while you are in the shower, use a plastic, non-slip stool.Marland Kitchen.  Keep the floor dry.  Immediately clean up any water that spills on the floor.  Remove soap buildup in the tub or shower on a regular basis.  Attach bath mats securely with double-sided non-slip rug tape.  Remove throw rugs and other tripping hazards from the floor. WHAT CAN I DO IN THE BEDROOM?  Use night lights.  Make sure that a bedside light is easy to reach.  Do not use oversized bedding that drapes onto the floor.  Have a firm chair that has side arms to use for getting dressed.  Remove throw rugs and other tripping hazards from the floor. WHAT CAN I DO IN THE KITCHEN?   Clean up any spills right away.  Avoid walking on wet floors.  Place frequently used items in easy-to-reach places.  If you need to reach for something above you, use a sturdy step stool that has a grab bar.  Keep electrical cables out of the way.  Do not use floor polish or wax that makes floors slippery. If you have to use wax, make sure that it is non-skid floor wax.  Remove throw rugs and other tripping hazards from the floor. WHAT CAN I DO IN THE STAIRWAYS?  Do not leave any items on the stairs.  Make sure that there are handrails on both sides of the stairs. Fix handrails that are broken or loose. Make sure that handrails are as long as the stairways.  Check any carpeting to make sure that it is firmly attached to the stairs. Fix any carpet  that is loose or worn.  Avoid having throw rugs at the top or bottom of stairways, or secure the rugs with carpet tape to prevent them from moving.  Make sure that you have a light switch at the top of the stairs and the bottom of the stairs. If you do not have them, have them installed. WHAT ARE SOME OTHER FALL PREVENTION TIPS?  Wear closed-toe shoes that fit well and support your feet. Wear shoes that have rubber soles or low heels.  When you use a stepladder, make sure that it is completely opened and that the sides are firmly locked. Have someone hold the ladder while you  are using it. Do not climb a closed stepladder.  Add color or contrast paint or tape to grab bars and handrails in your home. Place contrasting color strips on the first and last steps.  Use mobility aids as needed, such as canes, walkers, scooters, and crutches.  Turn on lights if it is dark. Replace any light bulbs that burn out.  Set up furniture so that there are clear paths. Keep the furniture in the same spot.  Fix any uneven floor surfaces.  Choose a carpet design that does not hide the edge of steps of a stairway.  Be aware of any and all pets.  Review your medicines with your healthcare provider. Some medicines can cause dizziness or changes in blood pressure, which increase your risk of falling. Talk with your health care provider about other ways that you can decrease your risk of falls. This may include working with a physical therapist or trainer to improve your strength, balance, and endurance.   This information is not intended to replace advice given to you by your health care provider. Make sure you discuss any questions you have with your health care provider.   Document Released: 06/09/2002 Document Revised: 11/03/2014 Document Reviewed: 07/24/2014 Elsevier Interactive Patient Education [employer]

## 2015-09-16 NOTE — Telephone Encounter (Signed)
Patient sign release form to get copies of MRI brain and head. Also she was given copy of MR angiogram. Pt will [facility] for copies of the desk. Pt seeing Dr.Sethi for a follow up visit.

## 2015-09-16 NOTE — Telephone Encounter (Signed)
APPT. REMINDER CALL, NO ANSWER, NO VOICE MAIL °

## 2015-09-17 ENCOUNTER — Ambulatory Visit (INDEPENDENT_AMBULATORY_CARE_PROVIDER_SITE_OTHER): Payer: Commercial Managed Care - HMO | Admitting: Internal Medicine

## 2015-09-17 ENCOUNTER — Encounter: Payer: Self-pay | Admitting: Internal Medicine

## 2015-09-17 VITALS — BP 125/74 | HR 74 | Temp 98.1°F | Ht 62.0 in | Wt 161.0 lb

## 2015-09-17 DIAGNOSIS — M25561 Pain in right knee: Secondary | ICD-10-CM | POA: Diagnosis not present

## 2015-09-17 DIAGNOSIS — I10 Essential (primary) hypertension: Secondary | ICD-10-CM | POA: Diagnosis not present

## 2015-09-17 DIAGNOSIS — H35033 Hypertensive retinopathy, bilateral: Secondary | ICD-10-CM | POA: Diagnosis not present

## 2015-09-17 DIAGNOSIS — M25569 Pain in unspecified knee: Secondary | ICD-10-CM | POA: Insufficient documentation

## 2015-09-17 DIAGNOSIS — M25562 Pain in left knee: Secondary | ICD-10-CM | POA: Diagnosis not present

## 2015-09-17 DIAGNOSIS — R3915 Urgency of urination: Secondary | ICD-10-CM | POA: Diagnosis not present

## 2015-09-17 DIAGNOSIS — H524 Presbyopia: Secondary | ICD-10-CM | POA: Diagnosis not present

## 2015-09-17 DIAGNOSIS — H5203 Hypermetropia, bilateral: Secondary | ICD-10-CM | POA: Diagnosis not present

## 2015-09-17 DIAGNOSIS — H5200 Hypermetropia, unspecified eye: Secondary | ICD-10-CM | POA: Diagnosis not present

## 2015-09-17 DIAGNOSIS — H5210 Myopia, unspecified eye: Secondary | ICD-10-CM | POA: Diagnosis not present

## 2015-09-17 LAB — POCT URINALYSIS DIPSTICK
Bilirubin, UA: NEGATIVE
Glucose, UA: NEGATIVE
Ketones, UA: NEGATIVE
Nitrite, UA: NEGATIVE
PROTEIN UA: NEGATIVE
SPEC GRAV UA: 1.01
UROBILINOGEN UA: 0.2
pH, UA: 6.5

## 2015-09-17 MED ORDER — FOSFOMYCIN TROMETHAMINE 3 G PO PACK: 3.0000 g | PACK | Freq: Once | ORAL | Status: DC

## 2015-09-17 NOTE — Patient Instructions (Signed)
[name], it was a pleasure to see you today. We will continue to work with [employer] to facilitate a referral. We will provide a prescription referral.   For your urinary urgency, we will obtain a urine sample today. If it is positive today, you can go to the pharmacy to pick up fosfomycin.

## 2015-09-17 NOTE — Assessment & Plan Note (Signed)
A: There is limited evidence for HA injections for OA pain, which this likely represents. I see no knee Xrays on file. I counseled her that this will likely not help. Nonetheless, the patient strongly believes this will help her and the risk of complications is low.  P: Referral for HA injection
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
## 2015-09-17 NOTE — Assessment & Plan Note (Signed)
A: Urinalysis is consistent with UTI with + leukocyte esterase.  P: 1x dose 3g fosfomycin

## 2015-09-18 ENCOUNTER — Other Ambulatory Visit: Payer: Self-pay | Admitting: Internal Medicine

## 2015-09-18 DIAGNOSIS — M810 Age-related osteoporosis without current pathological fracture: Secondary | ICD-10-CM

## 2015-09-18 DIAGNOSIS — E876 Hypokalemia: Secondary | ICD-10-CM

## 2015-09-19 LAB — URINE CULTURE

## 2015-09-20 NOTE — Addendum Note (Signed)
Addended by: Karl PockFORD, Eri Mcevers N on: 09/20/2015 10:49 AM   Modules accepted: Orders

## 2015-09-23 NOTE — Progress Notes (Signed)
Internal Medicine Clinic Attending  Case discussed with Dr. Ford at the time of the visit.  We reviewed the resident's history and exam and pertinent patient test results.  I agree with the assessment, diagnosis, and plan of care documented in the resident's note.  

## 2015-09-28 DIAGNOSIS — M25561 Pain in right knee: Secondary | ICD-10-CM | POA: Diagnosis not present

## 2015-09-28 DIAGNOSIS — M25562 Pain in left knee: Secondary | ICD-10-CM | POA: Diagnosis not present

## 2015-09-28 DIAGNOSIS — M170 Bilateral primary osteoarthritis of knee: Secondary | ICD-10-CM | POA: Diagnosis not present

## 2015-10-02 DIAGNOSIS — M25511 Pain in right shoulder: Secondary | ICD-10-CM | POA: Diagnosis not present

## 2015-10-02 DIAGNOSIS — S42211A Unspecified displaced fracture of surgical neck of right humerus, initial encounter for closed fracture: Secondary | ICD-10-CM | POA: Diagnosis not present

## 2015-10-02 DIAGNOSIS — M25611 Stiffness of right shoulder, not elsewhere classified: Secondary | ICD-10-CM | POA: Diagnosis not present

## 2015-10-07 ENCOUNTER — Encounter: Payer: Self-pay | Admitting: Internal Medicine

## 2015-10-07 DIAGNOSIS — M1712 Unilateral primary osteoarthritis, left knee: Secondary | ICD-10-CM | POA: Diagnosis not present

## 2015-10-07 DIAGNOSIS — M25562 Pain in left knee: Secondary | ICD-10-CM | POA: Diagnosis not present

## 2015-10-11 ENCOUNTER — Ambulatory Visit (INDEPENDENT_AMBULATORY_CARE_PROVIDER_SITE_OTHER): Payer: Commercial Managed Care - HMO | Admitting: Internal Medicine

## 2015-10-11 ENCOUNTER — Encounter: Payer: Self-pay | Admitting: Internal Medicine

## 2015-10-11 VITALS — BP 133/72 | HR 70 | Temp 97.8°F | Wt 160.2 lb

## 2015-10-11 DIAGNOSIS — W010XXA Fall on same level from slipping, tripping and stumbling without subsequent striking against object, initial encounter: Secondary | ICD-10-CM | POA: Diagnosis not present

## 2015-10-11 DIAGNOSIS — Y92000 Kitchen of unspecified non-institutional (private) residence as  the place of occurrence of the external cause: Secondary | ICD-10-CM

## 2015-10-11 DIAGNOSIS — M810 Age-related osteoporosis without current pathological fracture: Secondary | ICD-10-CM | POA: Diagnosis not present

## 2015-10-11 DIAGNOSIS — S4291XA Fracture of right shoulder girdle, part unspecified, initial encounter for closed fracture: Secondary | ICD-10-CM | POA: Insufficient documentation

## 2015-10-11 DIAGNOSIS — S4291XB Fracture of right shoulder girdle, part unspecified, initial encounter for open fracture: Secondary | ICD-10-CM

## 2015-10-11 NOTE — Progress Notes (Signed)
   Subjective:    Patient ID: Jill Hancock, female    DOB: March 07, 1941, 75 y.o.   MRN: [id number]  HPI  75 yo female with osteoporesis, GERD, HTN, had a fall on Saturday and fractured her right shoulder, here to request ortho referral.  She fell on the kitchen floor on Saturday after tripping and landed on the right shoulder. [facility] to [facility]. They did xray and told her she had shoulder fracture. She does not know exactly which bone or what part. She is requesting referral to be further treated then. She is not sure what the treatment plan is currently, she has appt to see them again to discuss treatment options. She is applying ice, has her arm in a sling currently. Has tramadol if she needs it (only using ibuprofen for now). Denies any other complaints.   Review of Systems  Constitutional: Negative for fever and chills.  HENT: Negative for congestion and sore throat.   Respiratory: Negative for chest tightness and shortness of breath.   Gastrointestinal: Negative for abdominal pain.  Musculoskeletal: Positive for arthralgias.       Objective:   Physical Exam  Constitutional: She is oriented to person, place, and time. She appears well-developed and well-nourished.  HENT:  Head: Normocephalic and atraumatic.  Eyes: Conjunctivae are normal. Right eye exhibits no discharge. Left eye exhibits no discharge.  Cardiovascular: Exam reveals no gallop and no friction rub.   No murmur heard. Pulmonary/Chest: Effort normal and breath sounds normal. No respiratory distress. She has no wheezes.  Musculoskeletal:  Right arm in a sling. I did not take this off for full examination.   Neurological: She is alert and oriented to person, place, and time.     Filed Vitals:   10/11/15 1554  BP: 133/72  Pulse: 70  Temp: 97.8 F (36.6 C)        Assessment & Plan:  See problem based a&p.

## 2015-10-11 NOTE — Assessment & Plan Note (Signed)
S/p mechanical fall on Saturday with shoulder fracture. Was seen at the sports clinic at [facility] on same day where they told her she had a fracture. She has osteoporesis - on fosamax. She is today just to get a referral to continue treatment at [facility] for her shoulder fracture. Per patient, she does not know what the treatment plan is at this time - they will discuss that on her appt on Wednesday.  - continue ice, ibuprofen, arm sling. Has tramadol PRN.  - [facility].

## 2015-10-11 NOTE — Patient Instructions (Addendum)
Referred you to ortho.  Please follow up with us as needed.

## 2015-10-12 NOTE — Progress Notes (Signed)
Case discussed with Dr. Ahmed at the time of the visit. We reviewed the resident's history and exam and pertinent patient test results. I agree with the assessment, diagnosis, and plan of care documented in the resident's note. 

## 2015-10-13 DIAGNOSIS — M25611 Stiffness of right shoulder, not elsewhere classified: Secondary | ICD-10-CM | POA: Diagnosis not present

## 2015-10-13 DIAGNOSIS — M25561 Pain in right knee: Secondary | ICD-10-CM | POA: Diagnosis not present

## 2015-10-13 DIAGNOSIS — M1711 Unilateral primary osteoarthritis, right knee: Secondary | ICD-10-CM | POA: Diagnosis not present

## 2015-10-13 DIAGNOSIS — S42211D Unspecified displaced fracture of surgical neck of right humerus, subsequent encounter for fracture with routine healing: Secondary | ICD-10-CM | POA: Diagnosis not present

## 2015-10-13 DIAGNOSIS — S4981XD Other specified injuries of right shoulder and upper arm, subsequent encounter: Secondary | ICD-10-CM | POA: Diagnosis not present

## 2015-10-13 DIAGNOSIS — M25511 Pain in right shoulder: Secondary | ICD-10-CM | POA: Diagnosis not present

## 2015-10-19 DIAGNOSIS — M1712 Unilateral primary osteoarthritis, left knee: Secondary | ICD-10-CM | POA: Diagnosis not present

## 2015-10-19 DIAGNOSIS — M25562 Pain in left knee: Secondary | ICD-10-CM | POA: Diagnosis not present

## 2015-10-21 DIAGNOSIS — M25561 Pain in right knee: Secondary | ICD-10-CM | POA: Diagnosis not present

## 2015-10-21 DIAGNOSIS — M1711 Unilateral primary osteoarthritis, right knee: Secondary | ICD-10-CM | POA: Diagnosis not present

## 2015-10-25 ENCOUNTER — Telehealth: Payer: Self-pay

## 2015-10-25 DIAGNOSIS — N3000 Acute cystitis without hematuria: Secondary | ICD-10-CM

## 2015-10-25 MED ORDER — FOSFOMYCIN TROMETHAMINE 3 G PO PACK: 3.0000 g | PACK | Freq: Once | ORAL | Status: DC

## 2015-10-25 NOTE — Telephone Encounter (Signed)
Pt calls and would like for PCP to call in something for "bladder infection"   Pt complaining of burning, frequency, and foul odor for about a week when she voids. Denies fevers or chills. Does not wish to come in but is aware that may be her only option.  Please advise

## 2015-10-25 NOTE — Telephone Encounter (Signed)
Please call pt back.

## 2015-10-25 NOTE — Telephone Encounter (Signed)
I spoke with the patient regarding her symptoms. She said she had sudden onset increased urinary frequency and burning, similar to UTIs she's had in the past. She denies any back pain, fever, or chills. She said she has tried nitrofurantoin in the past for UTIs, and it has been ineffective. I will prescribe 3g fosfomycin once. I instructed her that if her symptoms do no resolve, she should schedule an appointment in our clinic as soon as possible.

## 2015-10-25 NOTE — Telephone Encounter (Signed)
This is addressed in another encounter.

## 2015-10-26 DIAGNOSIS — M25562 Pain in left knee: Secondary | ICD-10-CM | POA: Diagnosis not present

## 2015-10-26 DIAGNOSIS — M1712 Unilateral primary osteoarthritis, left knee: Secondary | ICD-10-CM | POA: Diagnosis not present

## 2015-10-26 DIAGNOSIS — M1711 Unilateral primary osteoarthritis, right knee: Secondary | ICD-10-CM | POA: Diagnosis not present

## 2015-10-26 DIAGNOSIS — M25511 Pain in right shoulder: Secondary | ICD-10-CM | POA: Diagnosis not present

## 2015-10-26 DIAGNOSIS — M25561 Pain in right knee: Secondary | ICD-10-CM | POA: Diagnosis not present

## 2015-10-26 DIAGNOSIS — S40011D Contusion of right shoulder, subsequent encounter: Secondary | ICD-10-CM | POA: Diagnosis not present

## 2015-10-26 DIAGNOSIS — S42211D Unspecified displaced fracture of surgical neck of right humerus, subsequent encounter for fracture with routine healing: Secondary | ICD-10-CM | POA: Diagnosis not present

## 2015-10-26 DIAGNOSIS — M25611 Stiffness of right shoulder, not elsewhere classified: Secondary | ICD-10-CM | POA: Diagnosis not present

## 2015-10-27 ENCOUNTER — Telehealth: Payer: Self-pay | Admitting: *Deleted

## 2015-10-27 NOTE — Telephone Encounter (Signed)
rec'd message from [place]. That pt is having some bright red rectal bleeding today, i have tried to call twice with no answer, no vmail. Will continue to call

## 2015-10-27 NOTE — Telephone Encounter (Signed)
Have called 2 more times today and it sounded as if someone answered and hung up before speaking, phone may be out of order

## 2015-10-28 DIAGNOSIS — M1711 Unilateral primary osteoarthritis, right knee: Secondary | ICD-10-CM | POA: Diagnosis not present

## 2015-10-28 DIAGNOSIS — M25561 Pain in right knee: Secondary | ICD-10-CM | POA: Diagnosis not present

## 2015-11-03 ENCOUNTER — Other Ambulatory Visit: Payer: Self-pay | Admitting: Internal Medicine

## 2015-11-03 DIAGNOSIS — R3915 Urgency of urination: Secondary | ICD-10-CM

## 2015-11-04 DIAGNOSIS — R0602 Shortness of breath: Secondary | ICD-10-CM | POA: Diagnosis not present

## 2015-11-04 DIAGNOSIS — J209 Acute bronchitis, unspecified: Secondary | ICD-10-CM | POA: Diagnosis not present

## 2015-11-04 DIAGNOSIS — R05 Cough: Secondary | ICD-10-CM | POA: Diagnosis not present

## 2015-11-05 DIAGNOSIS — J22 Unspecified acute lower respiratory infection: Secondary | ICD-10-CM | POA: Diagnosis not present

## 2015-11-07 DIAGNOSIS — J988 Other specified respiratory disorders: Secondary | ICD-10-CM | POA: Diagnosis not present

## 2015-11-12 ENCOUNTER — Ambulatory Visit (INDEPENDENT_AMBULATORY_CARE_PROVIDER_SITE_OTHER): Payer: Commercial Managed Care - HMO | Admitting: Internal Medicine

## 2015-11-12 ENCOUNTER — Encounter: Payer: Self-pay | Admitting: Internal Medicine

## 2015-11-12 VITALS — BP 125/85 | HR 77 | Temp 98.3°F | Ht 62.0 in | Wt 162.7 lb

## 2015-11-12 DIAGNOSIS — F32A Depression, unspecified: Secondary | ICD-10-CM

## 2015-11-12 DIAGNOSIS — I10 Essential (primary) hypertension: Secondary | ICD-10-CM

## 2015-11-12 DIAGNOSIS — K219 Gastro-esophageal reflux disease without esophagitis: Secondary | ICD-10-CM | POA: Diagnosis not present

## 2015-11-12 DIAGNOSIS — M810 Age-related osteoporosis without current pathological fracture: Secondary | ICD-10-CM

## 2015-11-12 DIAGNOSIS — F329 Major depressive disorder, single episode, unspecified: Secondary | ICD-10-CM

## 2015-11-12 DIAGNOSIS — R3915 Urgency of urination: Secondary | ICD-10-CM

## 2015-11-12 DIAGNOSIS — R42 Dizziness and giddiness: Secondary | ICD-10-CM

## 2015-11-12 DIAGNOSIS — S4991XD Unspecified injury of right shoulder and upper arm, subsequent encounter: Secondary | ICD-10-CM

## 2015-11-12 DIAGNOSIS — Z79899 Other long term (current) drug therapy: Secondary | ICD-10-CM

## 2015-11-12 DIAGNOSIS — E785 Hyperlipidemia, unspecified: Secondary | ICD-10-CM | POA: Diagnosis not present

## 2015-11-12 DIAGNOSIS — W108XXD Fall (on) (from) other stairs and steps, subsequent encounter: Secondary | ICD-10-CM

## 2015-11-12 DIAGNOSIS — S4991XA Unspecified injury of right shoulder and upper arm, initial encounter: Secondary | ICD-10-CM | POA: Insufficient documentation

## 2015-11-12 MED ORDER — DICLOFENAC SODIUM 1 % TD GEL: 4.0000 g | Freq: Four times a day (QID) | TRANSDERMAL | Status: DC

## 2015-11-12 MED ORDER — PRAVASTATIN SODIUM 20 MG PO TABS: 20.0000 mg | ORAL_TABLET | ORAL | Status: DC

## 2015-11-12 MED ORDER — AMLODIPINE BESYLATE 5 MG PO TABS: 5.0000 mg | ORAL_TABLET | Freq: Every day | ORAL | Status: DC

## 2015-11-12 MED ORDER — MIRABEGRON ER 25 MG PO TB24: 25.0000 mg | ORAL_TABLET | Freq: Every day | ORAL | Status: DC

## 2015-11-12 MED ORDER — FLUOXETINE HCL 20 MG PO CAPS: 20.0000 mg | ORAL_CAPSULE | Freq: Every day | ORAL | Status: DC

## 2015-11-12 MED ORDER — LISINOPRIL 40 MG PO TABS: 40.0000 mg | ORAL_TABLET | Freq: Every day | ORAL | Status: DC

## 2015-11-12 MED ORDER — ESOMEPRAZOLE MAGNESIUM 20 MG PO CPDR
20.0000 mg | DELAYED_RELEASE_CAPSULE | Freq: Every day | ORAL | Status: DC
Start: 2015-11-12 — End: 2016-06-12

## 2015-11-12 NOTE — Patient Instructions (Signed)
[name],  It was a pleasure seeing you today.  For your shoulder, you can apply voltaren gel. Please follow up with the orthopedist.  For your persistent dizziness, we have made a referral to the ENT at [facility].  For your overactive bladder, we have started Myrbetriq (Mirabegron) 25 mg.  Please take all of your medications as prescribed.
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
## 2015-11-13 MED ORDER — MIRABEGRON ER 25 MG PO TB24: 25.0000 mg | ORAL_TABLET | Freq: Every day | ORAL | Status: DC

## 2015-11-13 NOTE — Telephone Encounter (Signed)
Patient calling about her myrbetriq not being available at her pharmacy for pick up. Looks like Dr. Ala DachFord has sent it accidentally to the mail order pharmacy. I have sent it to her [facility].

## 2015-11-14 NOTE — Assessment & Plan Note (Signed)
A: Patient indicates vestibular therapy has been helpful in the past. I do not suspect a recurrent stroke at this time.  P: [facility] referral.

## 2015-11-14 NOTE — Assessment & Plan Note (Signed)
A: BP 125/85 today, at goal.  P: Refill Amlodipine 5 mg daily, Lisinopril 40 mg daily

## 2015-11-14 NOTE — Assessment & Plan Note (Signed)
A: Responding well to PPI P: Refill Nexium

## 2015-11-14 NOTE — Assessment & Plan Note (Signed)
A: Mood is good today  P: Refill fluxoetine

## 2015-11-14 NOTE — Assessment & Plan Note (Signed)
A: Currently being following by [facility]  P:  F/u with [facility] Voltaren gel

## 2015-11-14 NOTE — Assessment & Plan Note (Signed)
A: On Fosamax. Due to fall risk, may be necessary to obtain repeat DEXA scan  P: Consider reorder of Dexa scan at next visit

## 2015-11-14 NOTE — Assessment & Plan Note (Signed)
A: Seems c/w with overactive bladder. I do not suspect a recurrent UTI at this time. We will try Mirabegron. Patient said she wants this medication to be sent toher mail order pharmacy. I educated the patient that it may take up to 8 weeks for this medication to be effective  P: Mirabegron 25 mg daily

## 2015-11-14 NOTE — Assessment & Plan Note (Signed)
A: Tolerating pravastatin well  P: Refill pravastatin

## 2015-11-15 NOTE — Progress Notes (Signed)
Internal Medicine Clinic Attending  Case discussed with Dr. Ford at the time of the visit.  We reviewed the resident's history and exam and pertinent patient test results.  I agree with the assessment, diagnosis, and plan of care documented in the resident's note.  

## 2015-11-16 ENCOUNTER — Telehealth: Payer: Self-pay | Admitting: *Deleted

## 2015-11-16 NOTE — Telephone Encounter (Signed)
Telephone encounter opened in error when checking referral reminders - no call made Dorie RankSharon Aireal Slater, RN, 11/16/15 - 7 P

## 2015-11-20 DIAGNOSIS — M25611 Stiffness of right shoulder, not elsewhere classified: Secondary | ICD-10-CM | POA: Diagnosis not present

## 2015-11-20 DIAGNOSIS — M25311 Other instability, right shoulder: Secondary | ICD-10-CM | POA: Diagnosis not present

## 2015-11-20 DIAGNOSIS — R29898 Other symptoms and signs involving the musculoskeletal system: Secondary | ICD-10-CM | POA: Diagnosis not present

## 2015-11-20 DIAGNOSIS — S42211D Unspecified displaced fracture of surgical neck of right humerus, subsequent encounter for fracture with routine healing: Secondary | ICD-10-CM | POA: Diagnosis not present

## 2015-11-30 DIAGNOSIS — M25561 Pain in right knee: Secondary | ICD-10-CM | POA: Diagnosis not present

## 2015-11-30 DIAGNOSIS — R262 Difficulty in walking, not elsewhere classified: Secondary | ICD-10-CM | POA: Diagnosis not present

## 2015-11-30 DIAGNOSIS — M170 Bilateral primary osteoarthritis of knee: Secondary | ICD-10-CM | POA: Diagnosis not present

## 2015-11-30 DIAGNOSIS — M1711 Unilateral primary osteoarthritis, right knee: Secondary | ICD-10-CM | POA: Diagnosis not present

## 2015-12-02 ENCOUNTER — Ambulatory Visit: Payer: Self-pay | Admitting: Internal Medicine

## 2015-12-02 ENCOUNTER — Ambulatory Visit: Payer: Self-pay | Admitting: Urology

## 2015-12-13 ENCOUNTER — Encounter: Payer: Self-pay | Admitting: Internal Medicine

## 2015-12-13 ENCOUNTER — Ambulatory Visit (HOSPITAL_COMMUNITY)
Admission: RE | Admit: 2015-12-13 | Discharge: 2015-12-13 | Disposition: A | Discharge status: Home / Self Care | Payer: Commercial Managed Care - HMO | Source: Ambulatory Visit | Attending: Internal Medicine | Admitting: Internal Medicine

## 2015-12-13 ENCOUNTER — Ambulatory Visit (INDEPENDENT_AMBULATORY_CARE_PROVIDER_SITE_OTHER): Payer: Commercial Managed Care - HMO | Admitting: Internal Medicine

## 2015-12-13 VITALS — BP 132/69 | HR 80 | Temp 98.4°F | Ht 62.0 in | Wt 161.4 lb

## 2015-12-13 DIAGNOSIS — S0990XA Unspecified injury of head, initial encounter: Secondary | ICD-10-CM

## 2015-12-13 DIAGNOSIS — S0012XA Contusion of left eyelid and periocular area, initial encounter: Secondary | ICD-10-CM

## 2015-12-13 DIAGNOSIS — Z8673 Personal history of transient ischemic attack (TIA), and cerebral infarction without residual deficits: Secondary | ICD-10-CM | POA: Diagnosis not present

## 2015-12-13 DIAGNOSIS — W19XXXA Unspecified fall, initial encounter: Secondary | ICD-10-CM | POA: Diagnosis not present

## 2015-12-13 DIAGNOSIS — G319 Degenerative disease of nervous system, unspecified: Secondary | ICD-10-CM | POA: Insufficient documentation

## 2015-12-13 DIAGNOSIS — I739 Peripheral vascular disease, unspecified: Secondary | ICD-10-CM | POA: Diagnosis not present

## 2015-12-13 DIAGNOSIS — W1830XA Fall on same level, unspecified, initial encounter: Secondary | ICD-10-CM

## 2015-12-13 DIAGNOSIS — R51 Headache: Secondary | ICD-10-CM | POA: Diagnosis not present

## 2015-12-13 NOTE — Progress Notes (Signed)
Subjective:    Patient ID: Jill Hancock, female    DOB: [birth year], 75 y.o.   MRN: [id number]  HPI Comments: Ms. Mcginnis is a 75 year old woman with PMH as below here with black eye s/p fall on concrete 4 days ago.  Reports hx CVAs w/ residual gait difficulty.  Is supposed to use walker.  Was reaching to get walker and reports mis-step and fall on concrete.  She is on daily baby ASA but no other blood thinners.  Denies chest pain, palpitations, lightheadness or syncope.     Past Medical History  Diagnosis Date  . Hypertension   . Vertigo   . Stroke [facility]) 02/2012    MRI revealed at least 3 subcentimeter acute infarctions with one area of subacute infarction in widely disparate vascular territories including territory of left cerebellum and around right caudate nucleus along with widespread lacunar infarcts, chronic microvascular ischemic change, and numerous microbleeds suggesting long standing hypertensive cerebrovascular disease.  MRA confirmed intracranial  . Hyperglycemia   . Multiple cerebral infarctions [facility]) august 2013  . Unsteady gait   . Hypokalemia    Current Outpatient Prescriptions on File Prior to Visit  Medication Sig Dispense Refill  . alendronate (FOSAMAX) 70 MG tablet TAKE 1 TABLET EVERY 7 DAYS.  SEE PACKAGE FOR ADDITIONAL INSTRUCTIONS 12 tablet 3  . amLODipine (NORVASC) 5 MG tablet Take 1 tablet (5 mg total) by mouth daily. 90 tablet 5  . aspirin 81 MG tablet Take 1 tablet (81 mg total) by mouth daily.    . Calcium-Phosphorus-Vitamin D (CALCIUM/D3 ADULT GUMMIES PO) Take by mouth.    . diazepam (VALIUM) 2 MG tablet Take 0.5 tablets (1 mg total) by mouth at bedtime as needed for anxiety. 30 tablet 0  . diclofenac sodium (VOLTAREN) 1 % GEL Apply 4 g topically 4 (four) times daily. 1 Tube 3  . esomeprazole (NEXIUM) 20 MG capsule Take 1 capsule (20 mg total) by mouth daily before breakfast. 90 capsule 5  . FLUoxetine (PROZAC) 20 MG capsule Take 1 capsule (20 mg total)  by mouth daily. 90 capsule 5  . lisinopril (PRINIVIL,ZESTRIL) 40 MG tablet Take 1 tablet (40 mg total) by mouth daily. 90 tablet 5  . meclizine (ANTIVERT) 25 MG tablet Take 1 tablet (25 mg total) by mouth daily as needed for dizziness. 30 tablet 1  . mirabegron ER (MYRBETRIQ) 25 MG TB24 tablet Take 1 tablet (25 mg total) by mouth daily. 30 tablet 3  . mirabegron ER (MYRBETRIQ) 25 MG TB24 tablet Take 1 tablet (25 mg total) by mouth daily. 30 tablet 2  . potassium chloride (K-DUR,KLOR-CON) 10 MEQ tablet TAKE 1 TABLET EVERY DAY 60 tablet 1  . pravastatin (PRAVACHOL) 20 MG tablet Take 1 tablet (20 mg total) by mouth every other day. 90 tablet 5   No current facility-administered medications on file prior to visit.    Review of Systems  HENT: Negative for ear discharge, ear pain, hearing loss and tinnitus.   Eyes: Negative for photophobia, pain, discharge, redness and visual disturbance.  Cardiovascular: Negative for chest pain and palpitations.  Gastrointestinal: Negative for vomiting.  Musculoskeletal: Positive for gait problem.       Chronic gait problem  Neurological: Negative for dizziness, syncope and light-headedness.       Filed Vitals:   12/13/15 1351  BP: 132/69  Pulse: 80  Temp: 98.4 F (36.9 C)  TempSrc: Oral  Height:  (1.575 m)  Weight: 161 lb 6.4 oz (  73.211 kg)  SpO2: 97%    Objective:   Physical Exam  Constitutional: She is oriented to person, place, and time. She appears well-developed. No distress.  HENT:  Right Ear: External ear normal.  Left Ear: External ear normal.  Mouth/Throat: Oropharynx is clear and moist. No oropharyngeal exudate.  TMs intact.  No ecchymosis behind ears.  There is a healing abrasion above the left eye.  There is blue discoloration surrounding the left eye.  Eyes: Conjunctivae and EOM are normal. Pupils are equal, round, and reactive to light. Right eye exhibits no discharge. Left eye exhibits no discharge. No scleral icterus.    Neck: Neck supple.  Cardiovascular: Normal rate, regular rhythm and normal heart sounds.  Exam reveals no gallop and no friction rub.   No murmur heard. Pulmonary/Chest: Effort normal and breath sounds normal. No respiratory distress. She has no wheezes. She has no rales.  Abdominal: Soft. Bowel sounds are normal. She exhibits no distension. There is no tenderness. There is no rebound and no guarding.  Musculoskeletal: Normal range of motion. She exhibits no edema or tenderness.  Neurological: She is alert and oriented to person, place, and time. No cranial nerve deficit. She exhibits normal muscle tone. Coordination normal.  Skin: Skin is warm and dry. She is not diaphoretic.  Psychiatric: She has a normal mood and affect. Her behavior is normal. Judgment and thought content normal.  Vitals reviewed.         Assessment & Plan:  Please see problem based charting for A&P.

## 2015-12-13 NOTE — Patient Instructions (Signed)
1. There does not appear to be fracture or bleeding on your head CT.  If you start to have trouble with vision; bleeding for nose,eyes or mouth; fever; vomiting; worsening head pain or any new or worsening symptoms please seek emergency help.  You can use ice (wrapped in a towel) for your eye (for 10 min every few hours).  You can take tylenol or NSAID (with food) as needed for short term while you have pain.   2. Please take all medications as prescribed.    3. If you have worsening of your symptoms or new symptoms arise, please call the clinic [phone]), or go to the ER immediately if symptoms are severe.   Please come back to see your primary doctor in 1 month or sooner if you need to.
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
## 2015-12-15 NOTE — Progress Notes (Signed)
Internal Medicine Clinic Attending  Case discussed with Dr. Wilson soon after the resident saw the patient.  We reviewed the resident's history and exam and pertinent patient test results.  I agree with the assessment, diagnosis, and plan of care documented in the resident's note.  

## 2015-12-16 ENCOUNTER — Encounter: Payer: Self-pay | Admitting: Internal Medicine

## 2015-12-27 DIAGNOSIS — M25511 Pain in right shoulder: Secondary | ICD-10-CM | POA: Diagnosis not present

## 2015-12-28 ENCOUNTER — Encounter: Payer: Self-pay | Admitting: *Deleted

## 2015-12-30 DIAGNOSIS — M25511 Pain in right shoulder: Secondary | ICD-10-CM | POA: Diagnosis not present

## 2016-01-03 DIAGNOSIS — M25511 Pain in right shoulder: Secondary | ICD-10-CM | POA: Diagnosis not present

## 2016-01-06 ENCOUNTER — Encounter: Payer: Self-pay | Admitting: Urology

## 2016-01-06 ENCOUNTER — Ambulatory Visit: Payer: Self-pay | Admitting: Urology

## 2016-01-06 DIAGNOSIS — M25511 Pain in right shoulder: Secondary | ICD-10-CM | POA: Diagnosis not present

## 2016-01-10 DIAGNOSIS — M25511 Pain in right shoulder: Secondary | ICD-10-CM | POA: Diagnosis not present

## 2016-01-13 DIAGNOSIS — M25511 Pain in right shoulder: Secondary | ICD-10-CM | POA: Diagnosis not present

## 2016-01-19 DIAGNOSIS — M25511 Pain in right shoulder: Secondary | ICD-10-CM | POA: Diagnosis not present

## 2016-01-20 DIAGNOSIS — M25511 Pain in right shoulder: Secondary | ICD-10-CM | POA: Diagnosis not present

## 2016-01-25 DIAGNOSIS — M25511 Pain in right shoulder: Secondary | ICD-10-CM | POA: Diagnosis not present

## 2016-01-27 DIAGNOSIS — M25561 Pain in right knee: Secondary | ICD-10-CM | POA: Diagnosis not present

## 2016-01-27 DIAGNOSIS — M170 Bilateral primary osteoarthritis of knee: Secondary | ICD-10-CM | POA: Diagnosis not present

## 2016-01-27 DIAGNOSIS — R262 Difficulty in walking, not elsewhere classified: Secondary | ICD-10-CM | POA: Diagnosis not present

## 2016-01-27 DIAGNOSIS — M25562 Pain in left knee: Secondary | ICD-10-CM | POA: Diagnosis not present

## 2016-01-28 DIAGNOSIS — M25511 Pain in right shoulder: Secondary | ICD-10-CM | POA: Diagnosis not present

## 2016-02-02 DIAGNOSIS — M25511 Pain in right shoulder: Secondary | ICD-10-CM | POA: Diagnosis not present

## 2016-02-03 NOTE — Addendum Note (Signed)
Addended by: Neomia Dear on: 02/03/2016 07:43 AM   Modules accepted: Orders

## 2016-02-04 DIAGNOSIS — M25511 Pain in right shoulder: Secondary | ICD-10-CM | POA: Diagnosis not present

## 2016-02-10 DIAGNOSIS — M25511 Pain in right shoulder: Secondary | ICD-10-CM | POA: Diagnosis not present

## 2016-02-15 DIAGNOSIS — M25511 Pain in right shoulder: Secondary | ICD-10-CM | POA: Diagnosis not present

## 2016-02-23 DIAGNOSIS — M25511 Pain in right shoulder: Secondary | ICD-10-CM | POA: Diagnosis not present

## 2016-03-17 DIAGNOSIS — M25511 Pain in right shoulder: Secondary | ICD-10-CM | POA: Diagnosis not present

## 2016-03-20 ENCOUNTER — Ambulatory Visit: Payer: Self-pay | Admitting: Nurse Practitioner

## 2016-03-21 ENCOUNTER — Encounter: Payer: Self-pay | Admitting: Nurse Practitioner

## 2016-03-22 DIAGNOSIS — M25511 Pain in right shoulder: Secondary | ICD-10-CM | POA: Diagnosis not present

## 2016-03-29 DIAGNOSIS — M25511 Pain in right shoulder: Secondary | ICD-10-CM | POA: Diagnosis not present

## 2016-03-30 ENCOUNTER — Other Ambulatory Visit: Payer: Self-pay | Admitting: Internal Medicine

## 2016-03-30 DIAGNOSIS — B029 Zoster without complications: Secondary | ICD-10-CM

## 2016-03-30 NOTE — Telephone Encounter (Signed)
I called pt - no answer and no voice mail ,unable to leave message that she needs an appt for this refill.

## 2016-04-03 DIAGNOSIS — B029 Zoster without complications: Secondary | ICD-10-CM | POA: Diagnosis not present

## 2016-04-10 ENCOUNTER — Telehealth: Payer: Self-pay | Admitting: *Deleted

## 2016-04-10 ENCOUNTER — Ambulatory Visit (INDEPENDENT_AMBULATORY_CARE_PROVIDER_SITE_OTHER): Payer: Commercial Managed Care - HMO | Admitting: Internal Medicine

## 2016-04-10 VITALS — BP 123/88 | HR 72 | Temp 97.9°F | Ht 62.0 in | Wt 163.1 lb

## 2016-04-10 DIAGNOSIS — N390 Urinary tract infection, site not specified: Secondary | ICD-10-CM

## 2016-04-10 DIAGNOSIS — Z09 Encounter for follow-up examination after completed treatment for conditions other than malignant neoplasm: Secondary | ICD-10-CM

## 2016-04-10 DIAGNOSIS — B9689 Other specified bacterial agents as the cause of diseases classified elsewhere: Secondary | ICD-10-CM

## 2016-04-10 DIAGNOSIS — Z8619 Personal history of other infectious and parasitic diseases: Secondary | ICD-10-CM

## 2016-04-10 DIAGNOSIS — T7491XD Unspecified adult maltreatment, confirmed, subsequent encounter: Secondary | ICD-10-CM

## 2016-04-10 DIAGNOSIS — B029 Zoster without complications: Secondary | ICD-10-CM

## 2016-04-10 DIAGNOSIS — N3000 Acute cystitis without hematuria: Secondary | ICD-10-CM

## 2016-04-10 LAB — POCT URINALYSIS DIPSTICK
BILIRUBIN UA: NEGATIVE
Glucose, UA: NEGATIVE
Ketones, UA: NEGATIVE
NITRITE UA: POSITIVE
Protein, UA: NEGATIVE
Spec Grav, UA: 1.02
Urobilinogen, UA: 0.2
pH, UA: 6

## 2016-04-10 MED ORDER — LIDOCAINE 5 % EX PTCH: 1.0000 | MEDICATED_PATCH | CUTANEOUS | 0 refills | Status: DC

## 2016-04-10 MED ORDER — SULFAMETHOXAZOLE-TRIMETHOPRIM 800-160 MG PO TABS: 1.0000 | ORAL_TABLET | Freq: Two times a day (BID) | ORAL | 0 refills | Status: DC

## 2016-04-10 NOTE — Patient Instructions (Signed)
It was a pleasure to meet you Jill Hancock.  Your shingles rash appears to have resolved. I have prescribed a lidocaine patch to help control your pain. We should consider a shingles vaccine (zostavax) in 6 months time.  I have prescribed an antibiotic called Bactrim DS to take twice a day for 3 days for your urinary symptoms.  I have given you information from our Child psychotherapistsocial worker. Please call the provided numbers for assistance.  Please call us there is anything else we can help you with.

## 2016-04-10 NOTE — Assessment & Plan Note (Signed)
Patient reports 1 week of burning on urination with increased frequency, urgency, and pink colored urine. She was last treated for a Klebsiella pneumoniae in March 2017 with a single dose of fosfomycin with relief. Cultures at that time showed intermediate sensitivity to nitrofurantoin and susceptibility to Bactrim. She does report that the fosfomycin was expensive for her.  Urine dipstick is positive for nitrites and small leukocytes. Urine is cloudy in appearance. Given her symptoms and dipstick results, I think it is appropriate to treat without obtaining a urine culture at this time.  -Bactrim DS BID for 3 days

## 2016-04-10 NOTE — Telephone Encounter (Signed)
Pt called asking for valcyclovir hcl 500mg  for her shingles again, informed her she would need an appt, she stated she did not have a way to get here as her husband was at work, I ask when she could be seen, she said she didn't mind but he was at work all the time, she then ask him if he could bring her now, I informed her we do not have an appt now, offered a 1315 appt, and she refused, he had a raised voice and said he would take her to a walk in clinic somewhere but not here at 1315. She ended the call
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
## 2016-04-10 NOTE — Assessment & Plan Note (Signed)
Patient states that she had a shingles breakout 2 weeks ago with a rash and few blisters on the middle of her left back. She was seen at a walk-in clinic in [place] and given a prescription for Valacyclovir 1000 mg TID for 1 week which she has completed. She reports continued pain and itching at the affected site. She has at least 1 prior episode of shingles 1 year ago. She is no history of immunosuppression but does report significant stress at home as detailed below. She has not had the Zostavax vaccine.  No skin changes suspicious for continued zoster rash suggesting that if she truly did have shingles this has now resolved. 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 I explained that there is no role for continued antiviral therapy as she has completed a full course treatment. She understands and agrees with symptomatic management at this time. -Start Lidoderm patch -Consider Zostavax 6 months after shingles outbreak

## 2016-04-11 NOTE — Progress Notes (Signed)
Internal Medicine Clinic Attending  Case discussed with Dr. Patel,Vishal at the time of the visit.  We reviewed the resident's history and exam and pertinent patient test results.  I agree with the assessment, diagnosis, and plan of care documented in the resident's note.  

## 2016-05-29 ENCOUNTER — Emergency Department (HOSPITAL_COMMUNITY)
Admission: EM | Admit: 2016-05-29 | Discharge: 2016-05-29 | Disposition: A | Discharge status: Home / Self Care | Payer: Commercial Managed Care - HMO | Attending: Emergency Medicine | Admitting: Emergency Medicine

## 2016-05-29 ENCOUNTER — Emergency Department (HOSPITAL_COMMUNITY): Payer: Commercial Managed Care - HMO

## 2016-05-29 ENCOUNTER — Encounter (HOSPITAL_COMMUNITY): Payer: Self-pay | Admitting: *Deleted

## 2016-05-29 DIAGNOSIS — M25552 Pain in left hip: Secondary | ICD-10-CM

## 2016-05-29 DIAGNOSIS — Z8673 Personal history of transient ischemic attack (TIA), and cerebral infarction without residual deficits: Secondary | ICD-10-CM | POA: Diagnosis not present

## 2016-05-29 DIAGNOSIS — I10 Essential (primary) hypertension: Secondary | ICD-10-CM | POA: Insufficient documentation

## 2016-05-29 DIAGNOSIS — N390 Urinary tract infection, site not specified: Secondary | ICD-10-CM | POA: Insufficient documentation

## 2016-05-29 DIAGNOSIS — Z7982 Long term (current) use of aspirin: Secondary | ICD-10-CM | POA: Diagnosis not present

## 2016-05-29 DIAGNOSIS — Z79899 Other long term (current) drug therapy: Secondary | ICD-10-CM | POA: Insufficient documentation

## 2016-05-29 LAB — URINE MICROSCOPIC-ADD ON: RBC / HPF: NONE SEEN RBC/hpf (ref 0–5)

## 2016-05-29 LAB — URINALYSIS, ROUTINE W REFLEX MICROSCOPIC
BILIRUBIN URINE: NEGATIVE
GLUCOSE, UA: NEGATIVE mg/dL
KETONES UR: NEGATIVE mg/dL
NITRITE: NEGATIVE
PH: 6.5 (ref 5.0–8.0)
PROTEIN: NEGATIVE mg/dL
Specific Gravity, Urine: 1.01 (ref 1.005–1.030)

## 2016-05-29 LAB — CBC WITH DIFFERENTIAL/PLATELET
BASOS ABS: 0 10*3/uL (ref 0.0–0.1)
BASOS PCT: 1 %
EOS ABS: 0.2 10*3/uL (ref 0.0–0.7)
Eosinophils Relative: 2 %
HCT: 38.6 % (ref 36.0–46.0)
Hemoglobin: 13.3 g/dL (ref 12.0–15.0)
Lymphocytes Relative: 25 %
Lymphs Abs: 2.1 10*3/uL (ref 0.7–4.0)
MCH: 30.2 pg (ref 26.0–34.0)
MCHC: 34.5 g/dL (ref 30.0–36.0)
MCV: 87.7 fL (ref 78.0–100.0)
MONO ABS: 0.5 10*3/uL (ref 0.1–1.0)
MONOS PCT: 5 %
Neutro Abs: 5.8 10*3/uL (ref 1.7–7.7)
Neutrophils Relative %: 67 %
PLATELETS: 368 10*3/uL (ref 150–400)
RBC: 4.4 MIL/uL (ref 3.87–5.11)
RDW: 13.7 % (ref 11.5–15.5)
WBC: 8.5 10*3/uL (ref 4.0–10.5)

## 2016-05-29 LAB — COMPREHENSIVE METABOLIC PANEL
ALBUMIN: 3.8 g/dL (ref 3.5–5.0)
ALT: 19 U/L (ref 14–54)
ANION GAP: 8 (ref 5–15)
AST: 28 U/L (ref 15–41)
Alkaline Phosphatase: 59 U/L (ref 38–126)
BUN: 7 mg/dL (ref 6–20)
CHLORIDE: 104 mmol/L (ref 101–111)
CO2: 28 mmol/L (ref 22–32)
Calcium: 9.2 mg/dL (ref 8.9–10.3)
Creatinine, Ser: 0.77 mg/dL (ref 0.44–1.00)
GFR calc Af Amer: >60 mL/min (ref >60)
GFR calc non Af Amer: >60 mL/min (ref >60)
GLUCOSE: 113 mg/dL — AB (ref 65–99)
POTASSIUM: 3.4 mmol/L — AB (ref 3.5–5.1)
SODIUM: 140 mmol/L (ref 135–145)
TOTAL PROTEIN: 7.1 g/dL (ref 6.5–8.1)
Total Bilirubin: 0.5 mg/dL (ref 0.3–1.2)

## 2016-05-29 MED ORDER — CEPHALEXIN 500 MG PO CAPS: 500.0000 mg | ORAL_CAPSULE | Freq: Four times a day (QID) | ORAL | 0 refills | Status: DC

## 2016-05-29 MED ORDER — SODIUM CHLORIDE 0.9 % IV BOLUS (SEPSIS)
1000.0000 mL | Freq: Once | INTRAVENOUS | Status: AC
  Administered 2016-05-29: 1000 mL via INTRAVENOUS

## 2016-05-29 MED ORDER — DEXTROSE 5 % IV SOLN
1.0000 g | Freq: Once | INTRAVENOUS | Status: AC
  Administered 2016-05-29: 1 g via INTRAVENOUS
  Filled 2016-05-29: qty 10

## 2016-05-29 NOTE — Discharge Planning (Signed)
Khayri Kargbo J. Lucretia RoersWood, RN, BSN, [employer] [phone] Spoke with pt at bedside regarding discharge planning for [facility]. Offered pt list of home health agencies to choose from.  Pt chose [facility] to render services. Ayesha RumpfMary Yonjof, RN of [facility] notified.  No DME needs identified at this time.

## 2016-05-29 NOTE — ED Notes (Signed)
Walked patient to the the bathroom to get and urine sample

## 2016-05-29 NOTE — ED Notes (Signed)
Patient given crackers and ginger ale. 

## 2016-05-29 NOTE — ED Triage Notes (Signed)
Pt states L hip/lower pain x 1 week that is worse with sitting.  Was seen by chiropractor with no improvement with pain.  Also c/o burning with urination and foul smelling urine.

## 2016-05-29 NOTE — ED Notes (Signed)
Pt attempting to provide urine sample
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
## 2016-05-29 NOTE — ED Notes (Signed)
Patient transported to X-ray 

## 2016-05-29 NOTE — ED Notes (Signed)
Got patient into a gown and on the monitor ask patient to get an urine sample patient stated that she couldn't go will try later

## 2016-05-30 DIAGNOSIS — M25552 Pain in left hip: Secondary | ICD-10-CM | POA: Diagnosis not present

## 2016-05-30 DIAGNOSIS — I10 Essential (primary) hypertension: Secondary | ICD-10-CM | POA: Diagnosis not present

## 2016-05-30 DIAGNOSIS — N390 Urinary tract infection, site not specified: Secondary | ICD-10-CM | POA: Diagnosis not present

## 2016-05-30 DIAGNOSIS — R2689 Other abnormalities of gait and mobility: Secondary | ICD-10-CM | POA: Diagnosis not present

## 2016-05-30 DIAGNOSIS — H8110 Benign paroxysmal vertigo, unspecified ear: Secondary | ICD-10-CM | POA: Diagnosis not present

## 2016-05-30 DIAGNOSIS — F329 Major depressive disorder, single episode, unspecified: Secondary | ICD-10-CM | POA: Diagnosis not present

## 2016-05-31 ENCOUNTER — Telehealth: Payer: Self-pay

## 2016-05-31 NOTE — Telephone Encounter (Signed)
Jill EvensJoanne from [facility] requesting VO. Please call back.

## 2016-06-01 LAB — URINE CULTURE

## 2016-06-01 NOTE — Telephone Encounter (Signed)
Lm for rtc 

## 2016-06-02 ENCOUNTER — Telehealth (HOSPITAL_BASED_OUTPATIENT_CLINIC_OR_DEPARTMENT_OTHER): Payer: Self-pay

## 2016-06-02 NOTE — Telephone Encounter (Addendum)
Post ED Visit - Positive Culture Follow-up  Culture report reviewed by antimicrobial stewardship pharmacist:  []  Enzo BiNathan Batchelder, Pharm.D. []  Celedonio MiyamotoJeremy Frens, [address]Ph[address]D., BCPS []  Garvin FilaMike Maccia, Pharm.D. []  Georgina PillionElizabeth Martin, Pharm.D., BCPS []  JeromeMinh Pham, [place]., BCPS, AAHIVP []  Estella HuskMichelle Turner, Pharm.D., BCPS, AAHIVP []  Tennis Mustassie Stewart, Pharm.D. []  Sherle Poeob Vincent, [place]arm.Milas Gain. X James Johnston, Pharm.D.  Positive urine culture, >/= 100,000 colonies -> E Coli Treated with Cephalexin, organism sensitive to the same and no further patient follow-up is required at this time.  Arvid RightClark, Mozes Sagar Dorn 06/02/2016, 12:20 PM

## 2016-06-12 ENCOUNTER — Ambulatory Visit (INDEPENDENT_AMBULATORY_CARE_PROVIDER_SITE_OTHER): Payer: Commercial Managed Care - HMO | Admitting: Internal Medicine

## 2016-06-12 VITALS — BP 140/78 | HR 79 | Temp 98.2°F | Ht 62.0 in | Wt 160.4 lb

## 2016-06-12 DIAGNOSIS — N3000 Acute cystitis without hematuria: Secondary | ICD-10-CM

## 2016-06-12 DIAGNOSIS — M810 Age-related osteoporosis without current pathological fracture: Secondary | ICD-10-CM

## 2016-06-12 DIAGNOSIS — F329 Major depressive disorder, single episode, unspecified: Secondary | ICD-10-CM

## 2016-06-12 DIAGNOSIS — Z87440 Personal history of urinary (tract) infections: Secondary | ICD-10-CM | POA: Diagnosis not present

## 2016-06-12 DIAGNOSIS — Z09 Encounter for follow-up examination after completed treatment for conditions other than malignant neoplasm: Secondary | ICD-10-CM | POA: Diagnosis not present

## 2016-06-12 DIAGNOSIS — E876 Hypokalemia: Secondary | ICD-10-CM

## 2016-06-12 DIAGNOSIS — I10 Essential (primary) hypertension: Secondary | ICD-10-CM

## 2016-06-12 DIAGNOSIS — Z024 Encounter for examination for driving license: Secondary | ICD-10-CM | POA: Diagnosis not present

## 2016-06-12 DIAGNOSIS — K219 Gastro-esophageal reflux disease without esophagitis: Secondary | ICD-10-CM

## 2016-06-12 DIAGNOSIS — Z0000 Encounter for general adult medical examination without abnormal findings: Secondary | ICD-10-CM

## 2016-06-12 DIAGNOSIS — F32A Depression, unspecified: Secondary | ICD-10-CM

## 2016-06-12 DIAGNOSIS — E785 Hyperlipidemia, unspecified: Secondary | ICD-10-CM

## 2016-06-12 DIAGNOSIS — H8110 Benign paroxysmal vertigo, unspecified ear: Secondary | ICD-10-CM

## 2016-06-12 MED ORDER — AMLODIPINE BESYLATE 5 MG PO TABS: 5.0000 mg | ORAL_TABLET | Freq: Every day | ORAL | 5 refills | Status: DC

## 2016-06-12 MED ORDER — LISINOPRIL 40 MG PO TABS: 40.0000 mg | ORAL_TABLET | Freq: Every day | ORAL | 5 refills | Status: DC

## 2016-06-12 MED ORDER — ALENDRONATE SODIUM 70 MG PO TABS: ORAL_TABLET | ORAL | 3 refills | Status: DC

## 2016-06-12 MED ORDER — ESOMEPRAZOLE MAGNESIUM 20 MG PO CPDR: 20.0000 mg | DELAYED_RELEASE_CAPSULE | Freq: Every day | ORAL | 5 refills | Status: DC

## 2016-06-12 MED ORDER — PRAVASTATIN SODIUM 20 MG PO TABS: 20.0000 mg | ORAL_TABLET | ORAL | 5 refills | Status: DC

## 2016-06-12 MED ORDER — MECLIZINE HCL 25 MG PO TABS: 25.0000 mg | ORAL_TABLET | Freq: Every day | ORAL | 1 refills | Status: DC | PRN

## 2016-06-12 MED ORDER — FLUOXETINE HCL 20 MG PO CAPS: 20.0000 mg | ORAL_CAPSULE | Freq: Every day | ORAL | 5 refills | Status: DC
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
## 2016-06-12 NOTE — Patient Instructions (Signed)
Please continue to take your medications as prescribed.  Drive safely!
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
## 2016-06-13 NOTE — Assessment & Plan Note (Signed)
Seen in ED on 11/27 for UTI with dysuria, urgency and urine culture grew >100k CFU E. Coli sensitive to cephalosporins. She received 1x dose IV Rocephin and was started on Keflex QID for 7 days, which she finished by 12/4-12/5 with complete resolution of her symptoms.   Plan: - Antibiotic course completed, no residual symptoms, consistent with treated UTI - Encouraged hydration and adequate hygiene, monitor for symptom recurrence

## 2016-06-13 NOTE — Progress Notes (Signed)
Internal Medicine Clinic Attending  I saw and evaluated the patient.  I personally confirmed the key portions of the history and exam documented by Dr. Johnson and I reviewed pertinent patient test results.  The assessment, diagnosis, and plan were formulated together and I agree with the documentation in the resident's note.  

## 2016-06-13 NOTE — Assessment & Plan Note (Addendum)
Reports improving mobility, undergoing home PT vis [facility] and may soon no longer require her walker.  Plan: - Encouraged activity and continued PT

## 2016-07-05 ENCOUNTER — Ambulatory Visit (INDEPENDENT_AMBULATORY_CARE_PROVIDER_SITE_OTHER): Payer: Medicare HMO | Admitting: Internal Medicine

## 2016-07-05 VITALS — BP 126/74 | HR 66 | Temp 98.3°F | Ht 62.0 in | Wt 159.6 lb

## 2016-07-05 DIAGNOSIS — R8299 Other abnormal findings in urine: Secondary | ICD-10-CM

## 2016-07-05 DIAGNOSIS — J069 Acute upper respiratory infection, unspecified: Secondary | ICD-10-CM | POA: Diagnosis not present

## 2016-07-05 DIAGNOSIS — R3915 Urgency of urination: Secondary | ICD-10-CM | POA: Diagnosis not present

## 2016-07-05 LAB — POCT URINALYSIS DIPSTICK
Glucose, UA: NEGATIVE
Nitrite, UA: NEGATIVE
PH UA: 7
PROTEIN UA: 30
SPEC GRAV UA: 1.02
Urobilinogen, UA: 0.2

## 2016-07-05 MED ORDER — BENZONATATE 100 MG PO CAPS: 100.0000 mg | ORAL_CAPSULE | Freq: Four times a day (QID) | ORAL | 1 refills | Status: AC | PRN

## 2016-07-05 NOTE — Assessment & Plan Note (Signed)
Patient complains of increased urinary frequency over the last week. She denies any symptoms of dysuria, burning, changes in smell or color of urine. She reports that her previous UTIs in the past have not had accompanying symptoms other than urinary frequency. Urine dipstick today in the office shows small amount of leukocytes negative nitrites. I'm inclined not to treat given her high risk for asymptomatic bacteriuria. I gave her good anticipatory guidance that if any new symptoms should arise she should call our clinic back for further evaluation and management.

## 2016-07-05 NOTE — Patient Instructions (Signed)
I'm sorry to hear about your upper respiratory infection. I would recommend the following to manage your symptoms:  1. Continue Mucinex DM for congestion and cough. 2. Take the Tessalon Rx for cough 3. Use an intranasal steroid spray for congestion such as Flonase or Nasonex 4. Take hot showers and drink hot tea  If your symptoms have not resolved in another 7-10 days, please call back and we may have to prescribe an antibiotic.

## 2016-07-05 NOTE — Progress Notes (Signed)
CC: URI and urinary frequency HPI: [name] is a 76 y.o. female with a h/o of GERD, depression, HTN, HLD who presents w/ symptoms of URI.  Please see Problem-based charting for HPI and the status of patient's chronic medical conditions.  Past Medical History:  Diagnosis Date  . Hyperglycemia   . Hypertension   . Hypokalemia   . Multiple cerebral infarctions [facility]) august 2013  . Stroke [facility]) 02/2012   MRI revealed at least 3 subcentimeter acute infarctions with one area of subacute infarction in widely disparate vascular territories including territory of left cerebellum and around right caudate nucleus along with widespread lacunar infarcts, chronic microvascular ischemic change, and numerous microbleeds suggesting long standing hypertensive cerebrovascular disease.  MRA confirmed intracranial  . Unsteady gait   . Vertigo     Social Hx: Social History  Substance Use Topics  . Smoking status: Never Smoker  . Smokeless tobacco: Never Used  . Alcohol use No   Review of Systems: ROS in HPI. Otherwise: Review of Systems  Constitutional: Negative for chills, fever and weight loss.  HENT: Positive for congestion and ear pain. Negative for ear discharge, hearing loss, nosebleeds, sinus pain, sore throat and tinnitus.   Respiratory: Positive for cough and sputum production. Negative for hemoptysis, shortness of breath, wheezing and stridor.   Cardiovascular: Negative for chest pain and leg swelling.  Gastrointestinal: Positive for nausea. Negative for abdominal pain, constipation, diarrhea and vomiting.  Genitourinary: Positive for frequency. Negative for dysuria and urgency.    Physical Exam: Vitals:   07/05/16 1548  BP: 126/74  Pulse: 66  Temp: 98.3 F (36.8 C)  TempSrc: Oral  SpO2: 99%  Weight: 159 lb 9.6 oz (72.4 kg)  Height: 5\' 2"  (1.575 m)   Physical Exam  Constitutional: She appears well-developed. She is cooperative. No distress.  HENT:  Right Ear:  Hearing and tympanic membrane normal.  Left Ear: Hearing and tympanic membrane normal.  Mouth/Throat: Mucous membranes are not pale and not dry. Posterior oropharyngeal erythema present. No oropharyngeal exudate or posterior oropharyngeal edema.  Cardiovascular: Normal rate, regular rhythm, normal heart sounds and normal pulses.  Exam reveals no gallop.   No murmur heard. Pulmonary/Chest: Effort normal and breath sounds normal. No respiratory distress. She has no wheezes. She has no rhonchi. She has no rales. Breasts are symmetrical.  Abdominal: Soft. Bowel sounds are normal. There is no tenderness.  Musculoskeletal: She exhibits no edema.    Assessment & Plan:  See encounters tab for problem based medical decision making. Patient discussed with Dr. Rogelia Boga  Urinary urgency Patient complains of increased urinary frequency over the last week. She denies any symptoms of dysuria, burning, changes in smell or color of urine. She reports that her previous UTIs in the past have not had accompanying symptoms other than urinary frequency. Urine dipstick today in the office shows small amount of leukocytes negative nitrites. I'm inclined not to treat given her high risk for asymptomatic bacteriuria. I gave her good anticipatory guidance that if any new symptoms should arise she should call our clinic back for further evaluation and management.  Acute URI Patient resents with complaints of cough productive of yellow sputum, congestion of the sinuses, left ear pressure, postnasal drip and rhinorrhea. She reports the symptoms began one week ago. She has been taking Mucinex with some relief of her symptoms. She attempted to take 2 leftover ciprofloxacin tablets which she had at home and did not get significant relief from this. Her  symptoms have not gotten progressively worse. She denies any fevers or chills. No shortness of breath or chest pain.  Patient appears to have a viral upper respiratory illness. She  has no lymphadenopathy on exam, she has small amount of erythema in the posterior oropharynx but no exudates no pressure or pain noted on palpation of the maxillary or frontal sinuses. I recommended symptomatically management with Mucinex or Robitussin-DM, I have given her prescription for Tessalon Perles for treatment of cough, have recommended intranasal steroids for her congestion, and hot showers for symptomatic treatment. She should follow up with us in 7-10 days her symptomsKorea are not resolved.   Signed: Carolynn CommentBryan Legrande Hao, MD 07/05/2016, 5:02 PM  Pager: [phone]

## 2016-07-05 NOTE — Assessment & Plan Note (Signed)
Patient resents with complaints of cough productive of yellow sputum, congestion of the sinuses, left ear pressure, postnasal drip and rhinorrhea. She reports the symptoms began one week ago. She has been taking Mucinex with some relief of her symptoms. She attempted to take 2 leftover ciprofloxacin tablets which she had at home and did not get significant relief from this. Her symptoms have not gotten progressively worse. She denies any fevers or chills. No shortness of breath or chest pain.  Patient appears to have a viral upper respiratory illness. She has no lymphadenopathy on exam, she has small amount of erythema in the posterior oropharynx but no exudates no pressure or pain noted on palpation of the maxillary or frontal sinuses. I recommended symptomatically management with Mucinex or Robitussin-DM, I have given her prescription for Tessalon Perles for treatment of cough, have recommended intranasal steroids for her congestion, and hot showers for symptomatic treatment. She should follow up with us in 7-10 days her symptoms are not resolved.

## 2016-07-07 NOTE — Progress Notes (Signed)
Internal Medicine Clinic Attending  Case discussed with Dr. Strelow at the time of the visit.  We reviewed the resident's history and exam and pertinent patient test results.  I agree with the assessment, diagnosis, and plan of care documented in the resident's note.  

## 2016-07-18 ENCOUNTER — Ambulatory Visit (INDEPENDENT_AMBULATORY_CARE_PROVIDER_SITE_OTHER): Payer: Medicare HMO | Admitting: Internal Medicine

## 2016-07-18 DIAGNOSIS — M545 Low back pain: Secondary | ICD-10-CM | POA: Diagnosis not present

## 2016-07-18 DIAGNOSIS — W19XXXA Unspecified fall, initial encounter: Secondary | ICD-10-CM

## 2016-07-18 DIAGNOSIS — Z9181 History of falling: Secondary | ICD-10-CM

## 2016-07-18 DIAGNOSIS — Z8249 Family history of ischemic heart disease and other diseases of the circulatory system: Secondary | ICD-10-CM

## 2016-07-18 DIAGNOSIS — Z823 Family history of stroke: Secondary | ICD-10-CM | POA: Diagnosis not present

## 2016-07-18 MED ORDER — METAXALONE 400 MG PO TABS: 800.0000 mg | ORAL_TABLET | Freq: Three times a day (TID) | ORAL | 0 refills | Status: DC

## 2016-07-18 NOTE — Patient Instructions (Signed)
Sorry to hear about your fall. Please take Ibuprofen as needed every 4-6 hours for pain. For severe pain, I have written a prescription for a muscle relaxer. If your symptoms are not improving in 5 days, please call the clinic, as you may need an x ray of your spine.

## 2016-07-18 NOTE — Assessment & Plan Note (Addendum)
Mechanical fall on Saturday 07/14/16. Pt w/o LOC. Back pain, bilateral lumbar region, no bony tenderness or step-off deformities. Neuro exam intact. NSAIDs mildly helpful. Metalalone 800mg  from old Rx helpful.  Plan: Give 5 day Rx for metaxalone. If sx are not improved in 1-2 weeks. Would recommend evaluation w/ XR for r/o fx. Exam in not in favor of bony abnormality at this time. Would also consider re-evaluation w/ DEXA scan if fx noted for evaluation of effectiveness of bisphosphonate therapy.

## 2016-07-18 NOTE — Progress Notes (Signed)
   CC: back pain HPI: Ms. Jill CockerLinda S Behney is a 76 y.o. female with a h/o of GERD, depression, HTN, HLD who presents w/ back pain after mechanical fall from standing in her house on Saturday.  Please see Problem-based charting for HPI and the status of patient's chronic medical conditions.  Past Medical History:  Diagnosis Date  . Hyperglycemia   . Hypertension   . Hypokalemia   . Multiple cerebral infarctions [facility](HCC) august 2013  . Stroke [facility](HCC) 02/2012   MRI revealed at least 3 subcentimeter acute infarctions with one area of subacute infarction in widely disparate vascular territories including territory of left cerebellum and around right caudate nucleus along with widespread lacunar infarcts, chronic microvascular ischemic change, and numerous microbleeds suggesting long standing hypertensive cerebrovascular disease.  MRA confirmed intracranial  . Unsteady gait   . Vertigo    Social History  Substance Use Topics  . Smoking status: Never Smoker  . Smokeless tobacco: Never Used  . Alcohol use No   Family History  Problem Relation Age of Onset  . Heart attack Mother   . Heart attack Brother     death at age 77  . Stroke Brother     death at 6761   Review of Systems: ROS in HPI. Otherwise: Review of Systems  Constitutional: Negative for chills, fever and weight loss.  Respiratory: Negative for cough and shortness of breath.   Cardiovascular: Negative for chest pain and leg swelling.  Gastrointestinal: Negative for abdominal pain, constipation, diarrhea, nausea and vomiting.  Genitourinary: Negative for dysuria, frequency and urgency.  Musculoskeletal: Positive for myalgias (bilateral lumbar pain in paraspinal region).  Neurological: Negative for dizziness, tingling, sensory change, focal weakness, loss of consciousness and headaches.   Physical Exam: Vitals:   07/18/16 1539  BP: 133/66  Pulse: 93  Temp: 97.8 F (36.6 C)  TempSrc: Oral  SpO2: 94%  Weight: 160 lb 14.4 oz  (73 kg)  Height: 5\' 2"  (1.575 m)   Physical Exam  Constitutional: She appears well-developed. She is cooperative. No distress.  Cardiovascular: Normal rate, regular rhythm, normal heart sounds and normal pulses.  Exam reveals no gallop.   No murmur heard. Pulmonary/Chest: Effort normal and breath sounds normal. No respiratory distress. She has no wheezes. She has no rhonchi. She has no rales. Breasts are symmetrical.  Abdominal: Soft. Bowel sounds are normal. There is no tenderness.  Musculoskeletal: Normal range of motion. She exhibits tenderness (TTP over bilateral paraspinal muscles in lumbar region. No bony TTP or step-off deformities.). She exhibits no edema or deformity.  Neurological: She has normal strength. No sensory deficit. She exhibits normal muscle tone.    Assessment & Plan:  See encounters tab for problem based medical decision making. Patient discussed with Dr. Josem KaufmannKlima  Fall Mechanical fall on Saturday 07/14/16. Pt w/o LOC. Back pain, bilateral lumbar region, no bony tenderness or step-off deformities. Neuro exam intact. NSAIDs mildly helpful. Metalalone 800mg  from old Rx helpful.  Plan: Give 5 day Rx for metaxalone. If sx are not improved in 1-2 weeks. Would recommend evaluation w/ XR for r/o fx. Exam in not in favor of bony abnormality at this time. Would also consider re-evaluation w/ DEXA scan if fx noted for evaluation of effectiveness of bisphosphonate therapy.   Signed: Carolynn CommentBryan Avilyn Virtue, MD 07/18/2016, 4:49 PM  Pager: [phone]

## 2016-07-21 NOTE — Progress Notes (Signed)
Case discussed with Dr. Strelow at the time of the visit.  We reviewed the resident's history and exam and pertinent patient test results.  I agree with the assessment, diagnosis and plan of care documented in the resident's note. 

## 2016-07-24 ENCOUNTER — Telehealth: Payer: Self-pay | Admitting: Internal Medicine

## 2016-07-24 ENCOUNTER — Other Ambulatory Visit: Payer: Self-pay | Admitting: Internal Medicine

## 2016-07-24 ENCOUNTER — Telehealth: Payer: Self-pay

## 2016-07-24 MED ORDER — METAXALONE 400 MG PO TABS: 800.0000 mg | ORAL_TABLET | Freq: Three times a day (TID) | ORAL | 0 refills | Status: DC

## 2016-07-24 NOTE — Telephone Encounter (Signed)
Patient requesting  her Metaxalone to be filled.

## 2016-07-24 NOTE — Telephone Encounter (Signed)
Patient requesting a refill on Tramadol.

## 2016-07-24 NOTE — Telephone Encounter (Signed)
Per DrStrelow, He will not refill tramdol at this time.  Pt has an appt w/pcp on 08/01/2016-she can discuss it at that time.Jill Hancock.Jill Wasser Cassady1/22/20184:11 PM  She has skelaxin and

## 2016-07-24 NOTE — Addendum Note (Signed)
Addended by: Carolynn CommentSTRELOW, Nick Armel A on: 07/24/2016 01:34 PM   Modules accepted: Orders

## 2016-07-24 NOTE — Telephone Encounter (Signed)
Jill Hancock from [facility] requesting VO for physical therapy two times a week for six weeks. Please call back.

## 2016-07-24 NOTE — Telephone Encounter (Signed)
Pt did not pick up the prescription during the allotted time and it expired. A new Rx was sent today. Pt should be instructed to pick it up promptly to avoid expiration. Use ibuprofen for breakthrough pain. Will not fill tramadol now. Thanks!

## 2016-07-25 ENCOUNTER — Other Ambulatory Visit: Payer: Self-pay | Admitting: Internal Medicine

## 2016-07-25 MED ORDER — TIZANIDINE HCL 4 MG PO TABS: 4.0000 mg | ORAL_TABLET | Freq: Four times a day (QID) | ORAL | 0 refills | Status: AC | PRN

## 2016-07-26 NOTE — Telephone Encounter (Signed)
Thank you, yes I believe continuing HH PT would be good for this patient.

## 2016-07-26 NOTE — Telephone Encounter (Signed)
[facility] to continue for safety, balance and endurance for 2x week for 6 weeks, do you agree w/ the VO?

## 2016-07-31 ENCOUNTER — Telehealth: Payer: Self-pay | Admitting: Internal Medicine

## 2016-07-31 DIAGNOSIS — M419 Scoliosis, unspecified: Secondary | ICD-10-CM | POA: Diagnosis not present

## 2016-07-31 DIAGNOSIS — I10 Essential (primary) hypertension: Secondary | ICD-10-CM | POA: Diagnosis not present

## 2016-07-31 DIAGNOSIS — F419 Anxiety disorder, unspecified: Secondary | ICD-10-CM | POA: Diagnosis not present

## 2016-07-31 DIAGNOSIS — M25552 Pain in left hip: Secondary | ICD-10-CM | POA: Diagnosis not present

## 2016-07-31 DIAGNOSIS — H8110 Benign paroxysmal vertigo, unspecified ear: Secondary | ICD-10-CM | POA: Diagnosis not present

## 2016-07-31 DIAGNOSIS — M810 Age-related osteoporosis without current pathological fracture: Secondary | ICD-10-CM | POA: Diagnosis not present
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
## 2016-07-31 NOTE — Telephone Encounter (Signed)
APT. REMINDER CALL, LMTCB °

## 2016-08-01 ENCOUNTER — Encounter: Payer: Self-pay | Admitting: Internal Medicine

## 2016-08-01 DIAGNOSIS — M545 Low back pain: Secondary | ICD-10-CM | POA: Diagnosis not present

## 2016-08-17 DIAGNOSIS — M545 Low back pain: Secondary | ICD-10-CM | POA: Diagnosis not present

## 2016-09-19 DIAGNOSIS — M545 Low back pain: Secondary | ICD-10-CM | POA: Diagnosis not present

## 2016-09-22 ENCOUNTER — Other Ambulatory Visit: Payer: Self-pay | Admitting: Internal Medicine

## 2016-09-22 ENCOUNTER — Telehealth: Payer: Self-pay | Admitting: Internal Medicine

## 2016-09-22 MED ORDER — CEPHALEXIN 500 MG PO CAPS: 500.0000 mg | ORAL_CAPSULE | Freq: Four times a day (QID) | ORAL | 0 refills | Status: AC

## 2016-09-22 NOTE — Telephone Encounter (Signed)
   Reason for call:   I received a call from Ms. Pilar JarvisLinda S Shatzer at 6:26 PM indicating she had a UTI.   Pertinent Data:   Reports burning and urgency for 4 days similar to previous UTI treated with Keflex.  No fever, chills, abdominal pain  No flank pain   Assessment / Plan / Recommendations:   Rx sent in for Keflex 500mg  QID x 3 days.  Confirmed with patient that she has taken Keflex previously without issue (PCN allergy noted)  Advised to call clinic on Monday if symptoms not resolving for [facility]  As always, pt is advised that if symptoms worsen or new symptoms arise, they should go to an urgent care facility or to to ER for further evaluation.   Gwynn BurlyAndrew Lilyth Lawyer, DO   09/22/2016, 6:26 PM

## 2016-09-27 DIAGNOSIS — J069 Acute upper respiratory infection, unspecified: Secondary | ICD-10-CM | POA: Diagnosis not present

## 2016-10-10 ENCOUNTER — Telehealth: Payer: Self-pay | Admitting: Internal Medicine

## 2016-10-10 NOTE — Telephone Encounter (Signed)
Calling to confirm appointment for 10/11/16 at 1:45 [facility]

## 2016-10-11 ENCOUNTER — Ambulatory Visit: Payer: Self-pay

## 2016-10-13 ENCOUNTER — Other Ambulatory Visit: Payer: Self-pay | Admitting: Internal Medicine

## 2016-10-13 DIAGNOSIS — F419 Anxiety disorder, unspecified: Secondary | ICD-10-CM

## 2016-10-13 NOTE — Telephone Encounter (Signed)
diazepam (VALIUM) 2 MG tablet Mail order pharmacy

## 2016-10-16 MED ORDER — DIAZEPAM 2 MG PO TABS: 1.0000 mg | ORAL_TABLET | Freq: Every evening | ORAL | 0 refills | Status: DC | PRN

## 2016-10-17 ENCOUNTER — Other Ambulatory Visit: Payer: Self-pay | Admitting: *Deleted

## 2016-10-17 DIAGNOSIS — E876 Hypokalemia: Secondary | ICD-10-CM

## 2016-10-17 DIAGNOSIS — I10 Essential (primary) hypertension: Secondary | ICD-10-CM

## 2016-10-17 MED ORDER — LISINOPRIL 40 MG PO TABS: 40.0000 mg | ORAL_TABLET | Freq: Every day | ORAL | 5 refills | Status: DC

## 2016-10-17 MED ORDER — POTASSIUM CHLORIDE CRYS ER 10 MEQ PO TBCR: 10.0000 meq | EXTENDED_RELEASE_TABLET | Freq: Every day | ORAL | 1 refills | Status: DC

## 2016-10-17 MED ORDER — AMLODIPINE BESYLATE 5 MG PO TABS: 5.0000 mg | ORAL_TABLET | Freq: Every day | ORAL | 5 refills | Status: DC

## 2016-10-17 NOTE — Telephone Encounter (Signed)
Valium rx called to [facility].

## 2016-11-01 DIAGNOSIS — M1711 Unilateral primary osteoarthritis, right knee: Secondary | ICD-10-CM | POA: Diagnosis not present

## 2016-11-06 DIAGNOSIS — N390 Urinary tract infection, site not specified: Secondary | ICD-10-CM | POA: Diagnosis not present

## 2016-11-06 DIAGNOSIS — R300 Dysuria: Secondary | ICD-10-CM | POA: Diagnosis not present

## 2016-12-06 ENCOUNTER — Other Ambulatory Visit: Payer: Self-pay | Admitting: Internal Medicine

## 2016-12-06 DIAGNOSIS — E785 Hyperlipidemia, unspecified: Secondary | ICD-10-CM

## 2016-12-06 MED ORDER — PRAVASTATIN SODIUM 20 MG PO TABS: 20.0000 mg | ORAL_TABLET | ORAL | 5 refills | Status: DC

## 2016-12-06 NOTE — Telephone Encounter (Signed)
Refill Request   pravastatin (PRAVACHOL) 20 MG tablet

## 2016-12-08 ENCOUNTER — Telehealth: Payer: Self-pay | Admitting: Internal Medicine

## 2016-12-08 NOTE — Telephone Encounter (Signed)
Spoke to pt, she is asking about pravastatin, [facility], she states she will get it there today but [facility], changed profile

## 2016-12-08 NOTE — Telephone Encounter (Signed)
[facility]/26/2017 WITH DR BLUM.  Pt states [facility] has not gotten her Medication refilled.  Please call patient at the cell number.

## 2016-12-13 ENCOUNTER — Emergency Department
Admission: EM | Admit: 2016-12-13 | Discharge: 2016-12-13 | Disposition: A | Discharge status: Home / Self Care | Payer: Medicare HMO | Attending: Emergency Medicine | Admitting: Emergency Medicine

## 2016-12-13 ENCOUNTER — Encounter: Payer: Self-pay | Admitting: *Deleted

## 2016-12-13 ENCOUNTER — Emergency Department: Payer: Medicare HMO

## 2016-12-13 DIAGNOSIS — S22080A Wedge compression fracture of T11-T12 vertebra, initial encounter for closed fracture: Secondary | ICD-10-CM | POA: Diagnosis not present

## 2016-12-13 DIAGNOSIS — S32050A Wedge compression fracture of fifth lumbar vertebra, initial encounter for closed fracture: Secondary | ICD-10-CM | POA: Insufficient documentation

## 2016-12-13 DIAGNOSIS — Y999 Unspecified external cause status: Secondary | ICD-10-CM | POA: Insufficient documentation

## 2016-12-13 DIAGNOSIS — Z7982 Long term (current) use of aspirin: Secondary | ICD-10-CM | POA: Insufficient documentation

## 2016-12-13 DIAGNOSIS — X509XXA Other and unspecified overexertion or strenuous movements or postures, initial encounter: Secondary | ICD-10-CM | POA: Diagnosis not present

## 2016-12-13 DIAGNOSIS — Z79899 Other long term (current) drug therapy: Secondary | ICD-10-CM | POA: Diagnosis not present

## 2016-12-13 DIAGNOSIS — S22000A Wedge compression fracture of unspecified thoracic vertebra, initial encounter for closed fracture: Secondary | ICD-10-CM | POA: Insufficient documentation

## 2016-12-13 DIAGNOSIS — I10 Essential (primary) hypertension: Secondary | ICD-10-CM | POA: Insufficient documentation

## 2016-12-13 DIAGNOSIS — Y939 Activity, unspecified: Secondary | ICD-10-CM | POA: Insufficient documentation

## 2016-12-13 DIAGNOSIS — R102 Pelvic and perineal pain: Secondary | ICD-10-CM | POA: Diagnosis not present

## 2016-12-13 DIAGNOSIS — M545 Low back pain: Secondary | ICD-10-CM | POA: Diagnosis not present

## 2016-12-13 DIAGNOSIS — Y929 Unspecified place or not applicable: Secondary | ICD-10-CM | POA: Insufficient documentation

## 2016-12-13 DIAGNOSIS — S29002A Unspecified injury of muscle and tendon of back wall of thorax, initial encounter: Secondary | ICD-10-CM | POA: Diagnosis present

## 2016-12-13 LAB — URINALYSIS, ROUTINE W REFLEX MICROSCOPIC
Bilirubin Urine: NEGATIVE
GLUCOSE, UA: NEGATIVE mg/dL
HGB URINE DIPSTICK: NEGATIVE
KETONES UR: NEGATIVE mg/dL
LEUKOCYTES UA: NEGATIVE
Nitrite: NEGATIVE
Protein, ur: NEGATIVE mg/dL
Specific Gravity, Urine: 1.008 (ref 1.005–1.030)
pH: 7 (ref 5.0–8.0)

## 2016-12-13 NOTE — ED Notes (Addendum)
See triage note  Developed lower back pain for about 3 days   States pain is lower back and moves into lower abd   States she had a fall more than a week ago  Ambulates well to room

## 2016-12-13 NOTE — Discharge Instructions (Signed)
Continue taking Tylenol as needed for back pain. Call your primary care doctor or the orthopedist about your compression fracture. You may use ice packs to your back as needed for comfort.

## 2016-12-13 NOTE — ED Notes (Signed)
Pt left prior to discharge - paperwork given to her in parking lot.

## 2016-12-13 NOTE — ED Triage Notes (Signed)
States lower back pain ever since she fell 1 week ago, states she thought it might be a UTI so she has been taking azo, when asked about urinary symptoms pt states "sometimes" , awake and alert in no acute distress
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
## 2016-12-14 DIAGNOSIS — H35033 Hypertensive retinopathy, bilateral: Secondary | ICD-10-CM | POA: Diagnosis not present

## 2016-12-14 DIAGNOSIS — H524 Presbyopia: Secondary | ICD-10-CM | POA: Diagnosis not present

## 2016-12-14 DIAGNOSIS — I10 Essential (primary) hypertension: Secondary | ICD-10-CM | POA: Diagnosis not present

## 2017-01-04 DIAGNOSIS — Z0100 Encounter for examination of eyes and vision without abnormal findings: Secondary | ICD-10-CM | POA: Diagnosis not present

## 2017-01-10 ENCOUNTER — Other Ambulatory Visit: Payer: Self-pay | Admitting: *Deleted

## 2017-01-10 DIAGNOSIS — E785 Hyperlipidemia, unspecified: Secondary | ICD-10-CM

## 2017-01-11 MED ORDER — PRAVASTATIN SODIUM 20 MG PO TABS: 20.0000 mg | ORAL_TABLET | ORAL | 5 refills | Status: DC

## 2017-01-15 ENCOUNTER — Other Ambulatory Visit: Payer: Self-pay

## 2017-01-15 NOTE — Telephone Encounter (Signed)
Requesting pravastatin to be filled @ [facility].

## 2017-01-16 ENCOUNTER — Encounter: Payer: Self-pay | Admitting: Internal Medicine
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
## 2017-01-17 NOTE — Telephone Encounter (Signed)
Called pt / informed [facility]ravastatin [facility]12 per Dr Obie DredgeBlum. Stated she will call the pharmacy and if she has any problems will call back.

## 2017-02-07 DIAGNOSIS — M1711 Unilateral primary osteoarthritis, right knee: Secondary | ICD-10-CM | POA: Diagnosis not present

## 2017-02-26 DIAGNOSIS — R300 Dysuria: Secondary | ICD-10-CM | POA: Diagnosis not present

## 2017-02-26 DIAGNOSIS — Z5321 Procedure and treatment not carried out due to patient leaving prior to being seen by health care provider: Secondary | ICD-10-CM | POA: Diagnosis not present

## 2017-02-26 DIAGNOSIS — R410 Disorientation, unspecified: Secondary | ICD-10-CM | POA: Diagnosis not present

## 2017-02-27 DIAGNOSIS — N390 Urinary tract infection, site not specified: Secondary | ICD-10-CM | POA: Diagnosis not present

## 2017-02-27 DIAGNOSIS — R399 Unspecified symptoms and signs involving the genitourinary system: Secondary | ICD-10-CM | POA: Diagnosis not present

## 2017-03-05 ENCOUNTER — Emergency Department (HOSPITAL_COMMUNITY)
Admission: EM | Admit: 2017-03-05 | Discharge: 2017-03-06 | Disposition: A | Discharge status: Home / Self Care | Payer: Medicare HMO | Attending: Emergency Medicine | Admitting: Emergency Medicine

## 2017-03-05 ENCOUNTER — Encounter (HOSPITAL_COMMUNITY): Payer: Self-pay | Admitting: Emergency Medicine

## 2017-03-05 DIAGNOSIS — M545 Low back pain: Secondary | ICD-10-CM | POA: Diagnosis not present

## 2017-03-05 DIAGNOSIS — Z5321 Procedure and treatment not carried out due to patient leaving prior to being seen by health care provider: Secondary | ICD-10-CM | POA: Diagnosis not present

## 2017-03-05 NOTE — ED Triage Notes (Signed)
Pt c/o lower back pain x 1 week. Hx of multiple falls, osteoporosis, pt wearing soft back brace at this time. Denies recent injury or fall, denies bowel/bladder incontinence. Reports urinary urgency which is normal for her.

## 2017-04-03 DIAGNOSIS — M545 Low back pain: Secondary | ICD-10-CM | POA: Diagnosis not present

## 2017-04-03 DIAGNOSIS — M25561 Pain in right knee: Secondary | ICD-10-CM | POA: Diagnosis not present

## 2017-05-16 ENCOUNTER — Other Ambulatory Visit: Payer: Self-pay | Admitting: *Deleted

## 2017-05-16 DIAGNOSIS — E876 Hypokalemia: Secondary | ICD-10-CM

## 2017-05-16 DIAGNOSIS — F419 Anxiety disorder, unspecified: Secondary | ICD-10-CM

## 2017-05-16 NOTE — Telephone Encounter (Signed)
Per dr blum, pt needs appt before refills, please asap

## 2017-05-21 ENCOUNTER — Other Ambulatory Visit: Payer: Self-pay | Admitting: Internal Medicine

## 2017-05-21 DIAGNOSIS — E876 Hypokalemia: Secondary | ICD-10-CM

## 2017-05-28 ENCOUNTER — Other Ambulatory Visit: Payer: Self-pay | Admitting: *Deleted

## 2017-05-28 DIAGNOSIS — F419 Anxiety disorder, unspecified: Secondary | ICD-10-CM

## 2017-05-28 MED ORDER — DIAZEPAM 2 MG PO TABS: 1.0000 mg | ORAL_TABLET | Freq: Every evening | ORAL | 0 refills | Status: DC | PRN

## 2017-05-31 DIAGNOSIS — M25561 Pain in right knee: Secondary | ICD-10-CM | POA: Diagnosis not present

## 2017-05-31 NOTE — Telephone Encounter (Signed)
rx phoned in.Jill Hancock.[name]11/29/20182:11 PM

## 2017-06-02 DIAGNOSIS — R0781 Pleurodynia: Secondary | ICD-10-CM | POA: Diagnosis not present

## 2017-06-12 ENCOUNTER — Encounter: Payer: Self-pay | Admitting: Internal Medicine

## 2017-06-13 DIAGNOSIS — R69 Illness, unspecified: Secondary | ICD-10-CM | POA: Diagnosis not present

## 2017-06-18 ENCOUNTER — Encounter: Payer: Self-pay | Admitting: Internal Medicine

## 2017-06-18 NOTE — Progress Notes (Deleted)
[facility]  Flu vaccine PHQ 9   LAB: BMP, A1c   76 F w hx cerebral infarction, HTN, HLD, GERD, anxiety, osteoporosis, prediabetes, and depression   Anxiety  - wellbutrin v incr prozac  HTN  BP Readings from Last 3 Encounters:  03/05/17 (!) 165/85  12/13/16 138/89  07/18/16 133/66  - amlodipine 5, lisinopril 40

## 2017-07-13 DIAGNOSIS — H2513 Age-related nuclear cataract, bilateral: Secondary | ICD-10-CM | POA: Diagnosis not present

## 2017-07-17 DIAGNOSIS — E782 Mixed hyperlipidemia: Secondary | ICD-10-CM | POA: Diagnosis not present

## 2017-07-17 DIAGNOSIS — Z1231 Encounter for screening mammogram for malignant neoplasm of breast: Secondary | ICD-10-CM | POA: Diagnosis not present

## 2017-07-17 DIAGNOSIS — I10 Essential (primary) hypertension: Secondary | ICD-10-CM | POA: Diagnosis not present

## 2017-07-17 DIAGNOSIS — Z5181 Encounter for therapeutic drug level monitoring: Secondary | ICD-10-CM | POA: Diagnosis not present

## 2017-07-17 DIAGNOSIS — Z8673 Personal history of transient ischemic attack (TIA), and cerebral infarction without residual deficits: Secondary | ICD-10-CM | POA: Diagnosis not present

## 2017-07-17 DIAGNOSIS — M810 Age-related osteoporosis without current pathological fracture: Secondary | ICD-10-CM | POA: Diagnosis not present

## 2017-07-17 DIAGNOSIS — F329 Major depressive disorder, single episode, unspecified: Secondary | ICD-10-CM | POA: Diagnosis not present

## 2017-07-17 DIAGNOSIS — Z1329 Encounter for screening for other suspected endocrine disorder: Secondary | ICD-10-CM | POA: Diagnosis not present

## 2017-07-17 DIAGNOSIS — R4781 Slurred speech: Secondary | ICD-10-CM | POA: Diagnosis not present

## 2017-07-19 ENCOUNTER — Other Ambulatory Visit: Payer: Self-pay | Admitting: Internal Medicine

## 2017-07-19 DIAGNOSIS — Z1231 Encounter for screening mammogram for malignant neoplasm of breast: Secondary | ICD-10-CM

## 2017-07-24 DIAGNOSIS — I69393 Ataxia following cerebral infarction: Secondary | ICD-10-CM | POA: Diagnosis not present

## 2017-07-24 DIAGNOSIS — F419 Anxiety disorder, unspecified: Secondary | ICD-10-CM | POA: Diagnosis not present

## 2017-07-24 DIAGNOSIS — M810 Age-related osteoporosis without current pathological fracture: Secondary | ICD-10-CM | POA: Diagnosis not present

## 2017-07-24 DIAGNOSIS — I69322 Dysarthria following cerebral infarction: Secondary | ICD-10-CM | POA: Diagnosis not present

## 2017-07-24 DIAGNOSIS — I10 Essential (primary) hypertension: Secondary | ICD-10-CM | POA: Diagnosis not present

## 2017-07-24 DIAGNOSIS — Z7982 Long term (current) use of aspirin: Secondary | ICD-10-CM | POA: Diagnosis not present

## 2017-07-24 DIAGNOSIS — Z9181 History of falling: Secondary | ICD-10-CM | POA: Diagnosis not present

## 2017-07-24 DIAGNOSIS — I69320 Aphasia following cerebral infarction: Secondary | ICD-10-CM | POA: Diagnosis not present

## 2017-07-24 DIAGNOSIS — F339 Major depressive disorder, recurrent, unspecified: Secondary | ICD-10-CM | POA: Diagnosis not present

## 2017-07-27 DIAGNOSIS — I69393 Ataxia following cerebral infarction: Secondary | ICD-10-CM | POA: Diagnosis not present

## 2017-07-27 DIAGNOSIS — I10 Essential (primary) hypertension: Secondary | ICD-10-CM | POA: Diagnosis not present

## 2017-07-27 DIAGNOSIS — I69322 Dysarthria following cerebral infarction: Secondary | ICD-10-CM | POA: Diagnosis not present

## 2017-07-27 DIAGNOSIS — I69320 Aphasia following cerebral infarction: Secondary | ICD-10-CM | POA: Diagnosis not present

## 2017-07-27 DIAGNOSIS — F339 Major depressive disorder, recurrent, unspecified: Secondary | ICD-10-CM | POA: Diagnosis not present

## 2017-07-27 DIAGNOSIS — Z7982 Long term (current) use of aspirin: Secondary | ICD-10-CM | POA: Diagnosis not present

## 2017-07-27 DIAGNOSIS — M810 Age-related osteoporosis without current pathological fracture: Secondary | ICD-10-CM | POA: Diagnosis not present

## 2017-07-27 DIAGNOSIS — Z9181 History of falling: Secondary | ICD-10-CM | POA: Diagnosis not present

## 2017-07-27 DIAGNOSIS — F419 Anxiety disorder, unspecified: Secondary | ICD-10-CM | POA: Diagnosis not present

## 2017-07-30 DIAGNOSIS — I69322 Dysarthria following cerebral infarction: Secondary | ICD-10-CM | POA: Diagnosis not present

## 2017-07-30 DIAGNOSIS — F419 Anxiety disorder, unspecified: Secondary | ICD-10-CM | POA: Diagnosis not present

## 2017-07-30 DIAGNOSIS — M810 Age-related osteoporosis without current pathological fracture: Secondary | ICD-10-CM | POA: Diagnosis not present

## 2017-07-30 DIAGNOSIS — Z7982 Long term (current) use of aspirin: Secondary | ICD-10-CM | POA: Diagnosis not present

## 2017-07-30 DIAGNOSIS — Z9181 History of falling: Secondary | ICD-10-CM | POA: Diagnosis not present

## 2017-07-30 DIAGNOSIS — I10 Essential (primary) hypertension: Secondary | ICD-10-CM | POA: Diagnosis not present

## 2017-07-30 DIAGNOSIS — I69393 Ataxia following cerebral infarction: Secondary | ICD-10-CM | POA: Diagnosis not present

## 2017-07-30 DIAGNOSIS — I69320 Aphasia following cerebral infarction: Secondary | ICD-10-CM | POA: Diagnosis not present

## 2017-07-30 DIAGNOSIS — F339 Major depressive disorder, recurrent, unspecified: Secondary | ICD-10-CM | POA: Diagnosis not present

## 2017-07-31 DIAGNOSIS — Z7982 Long term (current) use of aspirin: Secondary | ICD-10-CM | POA: Diagnosis not present

## 2017-07-31 DIAGNOSIS — I69322 Dysarthria following cerebral infarction: Secondary | ICD-10-CM | POA: Diagnosis not present

## 2017-07-31 DIAGNOSIS — I69320 Aphasia following cerebral infarction: Secondary | ICD-10-CM | POA: Diagnosis not present

## 2017-07-31 DIAGNOSIS — Z9181 History of falling: Secondary | ICD-10-CM | POA: Diagnosis not present

## 2017-07-31 DIAGNOSIS — F339 Major depressive disorder, recurrent, unspecified: Secondary | ICD-10-CM | POA: Diagnosis not present

## 2017-07-31 DIAGNOSIS — M810 Age-related osteoporosis without current pathological fracture: Secondary | ICD-10-CM | POA: Diagnosis not present

## 2017-07-31 DIAGNOSIS — I69393 Ataxia following cerebral infarction: Secondary | ICD-10-CM | POA: Diagnosis not present

## 2017-07-31 DIAGNOSIS — F419 Anxiety disorder, unspecified: Secondary | ICD-10-CM | POA: Diagnosis not present

## 2017-07-31 DIAGNOSIS — I10 Essential (primary) hypertension: Secondary | ICD-10-CM | POA: Diagnosis not present

## 2017-08-01 DIAGNOSIS — I10 Essential (primary) hypertension: Secondary | ICD-10-CM | POA: Diagnosis not present

## 2017-08-01 DIAGNOSIS — I69393 Ataxia following cerebral infarction: Secondary | ICD-10-CM | POA: Diagnosis not present

## 2017-08-01 DIAGNOSIS — M810 Age-related osteoporosis without current pathological fracture: Secondary | ICD-10-CM | POA: Diagnosis not present

## 2017-08-01 DIAGNOSIS — Z9181 History of falling: Secondary | ICD-10-CM | POA: Diagnosis not present

## 2017-08-01 DIAGNOSIS — F419 Anxiety disorder, unspecified: Secondary | ICD-10-CM | POA: Diagnosis not present

## 2017-08-01 DIAGNOSIS — F339 Major depressive disorder, recurrent, unspecified: Secondary | ICD-10-CM | POA: Diagnosis not present

## 2017-08-01 DIAGNOSIS — I69320 Aphasia following cerebral infarction: Secondary | ICD-10-CM | POA: Diagnosis not present

## 2017-08-01 DIAGNOSIS — I69322 Dysarthria following cerebral infarction: Secondary | ICD-10-CM | POA: Diagnosis not present

## 2017-08-01 DIAGNOSIS — Z7982 Long term (current) use of aspirin: Secondary | ICD-10-CM | POA: Diagnosis not present

## 2017-08-03 DIAGNOSIS — I69320 Aphasia following cerebral infarction: Secondary | ICD-10-CM | POA: Diagnosis not present

## 2017-08-03 DIAGNOSIS — M810 Age-related osteoporosis without current pathological fracture: Secondary | ICD-10-CM | POA: Diagnosis not present

## 2017-08-03 DIAGNOSIS — I10 Essential (primary) hypertension: Secondary | ICD-10-CM | POA: Diagnosis not present

## 2017-08-03 DIAGNOSIS — I69322 Dysarthria following cerebral infarction: Secondary | ICD-10-CM | POA: Diagnosis not present

## 2017-08-03 DIAGNOSIS — I69393 Ataxia following cerebral infarction: Secondary | ICD-10-CM | POA: Diagnosis not present

## 2017-08-03 DIAGNOSIS — F419 Anxiety disorder, unspecified: Secondary | ICD-10-CM | POA: Diagnosis not present

## 2017-08-03 DIAGNOSIS — F339 Major depressive disorder, recurrent, unspecified: Secondary | ICD-10-CM | POA: Diagnosis not present

## 2017-08-03 DIAGNOSIS — Z9181 History of falling: Secondary | ICD-10-CM | POA: Diagnosis not present

## 2017-08-03 DIAGNOSIS — Z7982 Long term (current) use of aspirin: Secondary | ICD-10-CM | POA: Diagnosis not present

## 2017-08-07 DIAGNOSIS — Z9181 History of falling: Secondary | ICD-10-CM | POA: Diagnosis not present

## 2017-08-07 DIAGNOSIS — I10 Essential (primary) hypertension: Secondary | ICD-10-CM | POA: Diagnosis not present

## 2017-08-07 DIAGNOSIS — I69393 Ataxia following cerebral infarction: Secondary | ICD-10-CM | POA: Diagnosis not present

## 2017-08-07 DIAGNOSIS — I69320 Aphasia following cerebral infarction: Secondary | ICD-10-CM | POA: Diagnosis not present

## 2017-08-07 DIAGNOSIS — Z7982 Long term (current) use of aspirin: Secondary | ICD-10-CM | POA: Diagnosis not present

## 2017-08-07 DIAGNOSIS — F419 Anxiety disorder, unspecified: Secondary | ICD-10-CM | POA: Diagnosis not present

## 2017-08-07 DIAGNOSIS — I69322 Dysarthria following cerebral infarction: Secondary | ICD-10-CM | POA: Diagnosis not present

## 2017-08-07 DIAGNOSIS — M810 Age-related osteoporosis without current pathological fracture: Secondary | ICD-10-CM | POA: Diagnosis not present

## 2017-08-07 DIAGNOSIS — F339 Major depressive disorder, recurrent, unspecified: Secondary | ICD-10-CM | POA: Diagnosis not present

## 2017-08-10 DIAGNOSIS — I69320 Aphasia following cerebral infarction: Secondary | ICD-10-CM | POA: Diagnosis not present

## 2017-08-10 DIAGNOSIS — F419 Anxiety disorder, unspecified: Secondary | ICD-10-CM | POA: Diagnosis not present

## 2017-08-10 DIAGNOSIS — I69393 Ataxia following cerebral infarction: Secondary | ICD-10-CM | POA: Diagnosis not present

## 2017-08-10 DIAGNOSIS — M810 Age-related osteoporosis without current pathological fracture: Secondary | ICD-10-CM | POA: Diagnosis not present

## 2017-08-10 DIAGNOSIS — I69322 Dysarthria following cerebral infarction: Secondary | ICD-10-CM | POA: Diagnosis not present

## 2017-08-10 DIAGNOSIS — I10 Essential (primary) hypertension: Secondary | ICD-10-CM | POA: Diagnosis not present

## 2017-08-10 DIAGNOSIS — Z9181 History of falling: Secondary | ICD-10-CM | POA: Diagnosis not present

## 2017-08-10 DIAGNOSIS — F339 Major depressive disorder, recurrent, unspecified: Secondary | ICD-10-CM | POA: Diagnosis not present

## 2017-08-10 DIAGNOSIS — Z7982 Long term (current) use of aspirin: Secondary | ICD-10-CM | POA: Diagnosis not present

## 2017-08-14 DIAGNOSIS — I69320 Aphasia following cerebral infarction: Secondary | ICD-10-CM | POA: Diagnosis not present

## 2017-08-14 DIAGNOSIS — F339 Major depressive disorder, recurrent, unspecified: Secondary | ICD-10-CM | POA: Diagnosis not present

## 2017-08-14 DIAGNOSIS — I69322 Dysarthria following cerebral infarction: Secondary | ICD-10-CM | POA: Diagnosis not present

## 2017-08-14 DIAGNOSIS — Z9181 History of falling: Secondary | ICD-10-CM | POA: Diagnosis not present

## 2017-08-14 DIAGNOSIS — M810 Age-related osteoporosis without current pathological fracture: Secondary | ICD-10-CM | POA: Diagnosis not present

## 2017-08-14 DIAGNOSIS — I10 Essential (primary) hypertension: Secondary | ICD-10-CM | POA: Diagnosis not present

## 2017-08-14 DIAGNOSIS — Z7982 Long term (current) use of aspirin: Secondary | ICD-10-CM | POA: Diagnosis not present

## 2017-08-14 DIAGNOSIS — I69393 Ataxia following cerebral infarction: Secondary | ICD-10-CM | POA: Diagnosis not present

## 2017-08-14 DIAGNOSIS — F419 Anxiety disorder, unspecified: Secondary | ICD-10-CM | POA: Diagnosis not present

## 2017-08-16 DIAGNOSIS — F339 Major depressive disorder, recurrent, unspecified: Secondary | ICD-10-CM | POA: Diagnosis not present

## 2017-08-16 DIAGNOSIS — Z7982 Long term (current) use of aspirin: Secondary | ICD-10-CM | POA: Diagnosis not present

## 2017-08-16 DIAGNOSIS — Z9181 History of falling: Secondary | ICD-10-CM | POA: Diagnosis not present

## 2017-08-16 DIAGNOSIS — I10 Essential (primary) hypertension: Secondary | ICD-10-CM | POA: Diagnosis not present

## 2017-08-16 DIAGNOSIS — M810 Age-related osteoporosis without current pathological fracture: Secondary | ICD-10-CM | POA: Diagnosis not present

## 2017-08-16 DIAGNOSIS — F419 Anxiety disorder, unspecified: Secondary | ICD-10-CM | POA: Diagnosis not present

## 2017-08-16 DIAGNOSIS — I69320 Aphasia following cerebral infarction: Secondary | ICD-10-CM | POA: Diagnosis not present

## 2017-08-16 DIAGNOSIS — I69322 Dysarthria following cerebral infarction: Secondary | ICD-10-CM | POA: Diagnosis not present

## 2017-08-16 DIAGNOSIS — I69393 Ataxia following cerebral infarction: Secondary | ICD-10-CM | POA: Diagnosis not present

## 2017-08-17 DIAGNOSIS — Z9181 History of falling: Secondary | ICD-10-CM | POA: Diagnosis not present

## 2017-08-17 DIAGNOSIS — M810 Age-related osteoporosis without current pathological fracture: Secondary | ICD-10-CM | POA: Diagnosis not present

## 2017-08-17 DIAGNOSIS — I69320 Aphasia following cerebral infarction: Secondary | ICD-10-CM | POA: Diagnosis not present

## 2017-08-17 DIAGNOSIS — F339 Major depressive disorder, recurrent, unspecified: Secondary | ICD-10-CM | POA: Diagnosis not present

## 2017-08-17 DIAGNOSIS — I10 Essential (primary) hypertension: Secondary | ICD-10-CM | POA: Diagnosis not present

## 2017-08-17 DIAGNOSIS — F419 Anxiety disorder, unspecified: Secondary | ICD-10-CM | POA: Diagnosis not present

## 2017-08-17 DIAGNOSIS — I69322 Dysarthria following cerebral infarction: Secondary | ICD-10-CM | POA: Diagnosis not present

## 2017-08-17 DIAGNOSIS — I69393 Ataxia following cerebral infarction: Secondary | ICD-10-CM | POA: Diagnosis not present

## 2017-08-17 DIAGNOSIS — Z7982 Long term (current) use of aspirin: Secondary | ICD-10-CM | POA: Diagnosis not present

## 2017-08-20 ENCOUNTER — Ambulatory Visit
Admission: RE | Admit: 2017-08-20 | Discharge: 2017-08-20 | Disposition: A | Discharge status: Home / Self Care | Payer: Medicare HMO | Source: Ambulatory Visit | Attending: Internal Medicine | Admitting: Internal Medicine

## 2017-08-20 DIAGNOSIS — Z1231 Encounter for screening mammogram for malignant neoplasm of breast: Secondary | ICD-10-CM

## 2017-08-21 DIAGNOSIS — I10 Essential (primary) hypertension: Secondary | ICD-10-CM | POA: Diagnosis not present

## 2017-08-21 DIAGNOSIS — I69320 Aphasia following cerebral infarction: Secondary | ICD-10-CM | POA: Diagnosis not present

## 2017-08-21 DIAGNOSIS — I69393 Ataxia following cerebral infarction: Secondary | ICD-10-CM | POA: Diagnosis not present

## 2017-08-21 DIAGNOSIS — M810 Age-related osteoporosis without current pathological fracture: Secondary | ICD-10-CM | POA: Diagnosis not present

## 2017-08-21 DIAGNOSIS — Z9181 History of falling: Secondary | ICD-10-CM | POA: Diagnosis not present

## 2017-08-21 DIAGNOSIS — F419 Anxiety disorder, unspecified: Secondary | ICD-10-CM | POA: Diagnosis not present

## 2017-08-21 DIAGNOSIS — F339 Major depressive disorder, recurrent, unspecified: Secondary | ICD-10-CM | POA: Diagnosis not present

## 2017-08-21 DIAGNOSIS — Z7982 Long term (current) use of aspirin: Secondary | ICD-10-CM | POA: Diagnosis not present

## 2017-08-21 DIAGNOSIS — I69322 Dysarthria following cerebral infarction: Secondary | ICD-10-CM | POA: Diagnosis not present

## 2017-08-23 DIAGNOSIS — H01009 Unspecified blepharitis unspecified eye, unspecified eyelid: Secondary | ICD-10-CM | POA: Diagnosis not present

## 2017-08-23 DIAGNOSIS — H2512 Age-related nuclear cataract, left eye: Secondary | ICD-10-CM | POA: Diagnosis not present

## 2017-08-23 DIAGNOSIS — H25013 Cortical age-related cataract, bilateral: Secondary | ICD-10-CM | POA: Diagnosis not present

## 2017-08-23 DIAGNOSIS — H35033 Hypertensive retinopathy, bilateral: Secondary | ICD-10-CM | POA: Diagnosis not present

## 2017-08-23 DIAGNOSIS — H2513 Age-related nuclear cataract, bilateral: Secondary | ICD-10-CM | POA: Diagnosis not present

## 2017-08-24 DIAGNOSIS — M810 Age-related osteoporosis without current pathological fracture: Secondary | ICD-10-CM | POA: Diagnosis not present

## 2017-08-24 DIAGNOSIS — F419 Anxiety disorder, unspecified: Secondary | ICD-10-CM | POA: Diagnosis not present

## 2017-08-24 DIAGNOSIS — I69320 Aphasia following cerebral infarction: Secondary | ICD-10-CM | POA: Diagnosis not present

## 2017-08-24 DIAGNOSIS — I69393 Ataxia following cerebral infarction: Secondary | ICD-10-CM | POA: Diagnosis not present

## 2017-08-24 DIAGNOSIS — Z9181 History of falling: Secondary | ICD-10-CM | POA: Diagnosis not present

## 2017-08-24 DIAGNOSIS — I10 Essential (primary) hypertension: Secondary | ICD-10-CM | POA: Diagnosis not present

## 2017-08-24 DIAGNOSIS — F339 Major depressive disorder, recurrent, unspecified: Secondary | ICD-10-CM | POA: Diagnosis not present

## 2017-08-24 DIAGNOSIS — Z7982 Long term (current) use of aspirin: Secondary | ICD-10-CM | POA: Diagnosis not present

## 2017-08-24 DIAGNOSIS — I69322 Dysarthria following cerebral infarction: Secondary | ICD-10-CM | POA: Diagnosis not present

## 2017-08-27 DIAGNOSIS — F419 Anxiety disorder, unspecified: Secondary | ICD-10-CM | POA: Diagnosis not present

## 2017-08-27 DIAGNOSIS — I69393 Ataxia following cerebral infarction: Secondary | ICD-10-CM | POA: Diagnosis not present

## 2017-08-27 DIAGNOSIS — I10 Essential (primary) hypertension: Secondary | ICD-10-CM | POA: Diagnosis not present

## 2017-08-27 DIAGNOSIS — Z7982 Long term (current) use of aspirin: Secondary | ICD-10-CM | POA: Diagnosis not present

## 2017-08-27 DIAGNOSIS — I69320 Aphasia following cerebral infarction: Secondary | ICD-10-CM | POA: Diagnosis not present

## 2017-08-27 DIAGNOSIS — I69322 Dysarthria following cerebral infarction: Secondary | ICD-10-CM | POA: Diagnosis not present

## 2017-08-27 DIAGNOSIS — M810 Age-related osteoporosis without current pathological fracture: Secondary | ICD-10-CM | POA: Diagnosis not present

## 2017-08-27 DIAGNOSIS — Z9181 History of falling: Secondary | ICD-10-CM | POA: Diagnosis not present

## 2017-08-27 DIAGNOSIS — F339 Major depressive disorder, recurrent, unspecified: Secondary | ICD-10-CM | POA: Diagnosis not present

## 2017-08-28 DIAGNOSIS — H25812 Combined forms of age-related cataract, left eye: Secondary | ICD-10-CM | POA: Diagnosis not present

## 2017-08-28 DIAGNOSIS — H2512 Age-related nuclear cataract, left eye: Secondary | ICD-10-CM | POA: Diagnosis not present

## 2017-08-31 DIAGNOSIS — I69320 Aphasia following cerebral infarction: Secondary | ICD-10-CM | POA: Diagnosis not present

## 2017-08-31 DIAGNOSIS — F419 Anxiety disorder, unspecified: Secondary | ICD-10-CM | POA: Diagnosis not present

## 2017-08-31 DIAGNOSIS — I10 Essential (primary) hypertension: Secondary | ICD-10-CM | POA: Diagnosis not present

## 2017-08-31 DIAGNOSIS — M810 Age-related osteoporosis without current pathological fracture: Secondary | ICD-10-CM | POA: Diagnosis not present

## 2017-08-31 DIAGNOSIS — Z9181 History of falling: Secondary | ICD-10-CM | POA: Diagnosis not present

## 2017-08-31 DIAGNOSIS — Z7982 Long term (current) use of aspirin: Secondary | ICD-10-CM | POA: Diagnosis not present

## 2017-08-31 DIAGNOSIS — I69322 Dysarthria following cerebral infarction: Secondary | ICD-10-CM | POA: Diagnosis not present

## 2017-08-31 DIAGNOSIS — I69393 Ataxia following cerebral infarction: Secondary | ICD-10-CM | POA: Diagnosis not present

## 2017-08-31 DIAGNOSIS — F339 Major depressive disorder, recurrent, unspecified: Secondary | ICD-10-CM | POA: Diagnosis not present

## 2017-09-03 DIAGNOSIS — Z9181 History of falling: Secondary | ICD-10-CM | POA: Diagnosis not present

## 2017-09-03 DIAGNOSIS — F419 Anxiety disorder, unspecified: Secondary | ICD-10-CM | POA: Diagnosis not present

## 2017-09-03 DIAGNOSIS — I10 Essential (primary) hypertension: Secondary | ICD-10-CM | POA: Diagnosis not present

## 2017-09-03 DIAGNOSIS — I69322 Dysarthria following cerebral infarction: Secondary | ICD-10-CM | POA: Diagnosis not present

## 2017-09-03 DIAGNOSIS — M810 Age-related osteoporosis without current pathological fracture: Secondary | ICD-10-CM | POA: Diagnosis not present

## 2017-09-03 DIAGNOSIS — I69393 Ataxia following cerebral infarction: Secondary | ICD-10-CM | POA: Diagnosis not present

## 2017-09-03 DIAGNOSIS — F339 Major depressive disorder, recurrent, unspecified: Secondary | ICD-10-CM | POA: Diagnosis not present

## 2017-09-03 DIAGNOSIS — I69320 Aphasia following cerebral infarction: Secondary | ICD-10-CM | POA: Diagnosis not present

## 2017-09-03 DIAGNOSIS — Z7982 Long term (current) use of aspirin: Secondary | ICD-10-CM | POA: Diagnosis not present

## 2017-09-04 DIAGNOSIS — H2512 Age-related nuclear cataract, left eye: Secondary | ICD-10-CM | POA: Diagnosis not present

## 2017-09-04 DIAGNOSIS — M1711 Unilateral primary osteoarthritis, right knee: Secondary | ICD-10-CM | POA: Diagnosis not present

## 2017-09-04 DIAGNOSIS — I69320 Aphasia following cerebral infarction: Secondary | ICD-10-CM | POA: Diagnosis not present

## 2017-09-04 DIAGNOSIS — M810 Age-related osteoporosis without current pathological fracture: Secondary | ICD-10-CM | POA: Diagnosis not present

## 2017-09-04 DIAGNOSIS — I69393 Ataxia following cerebral infarction: Secondary | ICD-10-CM | POA: Diagnosis not present

## 2017-09-04 DIAGNOSIS — I69322 Dysarthria following cerebral infarction: Secondary | ICD-10-CM | POA: Diagnosis not present

## 2017-09-04 DIAGNOSIS — Z9181 History of falling: Secondary | ICD-10-CM | POA: Diagnosis not present

## 2017-09-04 DIAGNOSIS — F339 Major depressive disorder, recurrent, unspecified: Secondary | ICD-10-CM | POA: Diagnosis not present

## 2017-09-04 DIAGNOSIS — Z7982 Long term (current) use of aspirin: Secondary | ICD-10-CM | POA: Diagnosis not present

## 2017-09-04 DIAGNOSIS — F419 Anxiety disorder, unspecified: Secondary | ICD-10-CM | POA: Diagnosis not present

## 2017-09-04 DIAGNOSIS — I10 Essential (primary) hypertension: Secondary | ICD-10-CM | POA: Diagnosis not present

## 2017-09-06 DIAGNOSIS — F419 Anxiety disorder, unspecified: Secondary | ICD-10-CM | POA: Diagnosis not present

## 2017-09-06 DIAGNOSIS — I69322 Dysarthria following cerebral infarction: Secondary | ICD-10-CM | POA: Diagnosis not present

## 2017-09-06 DIAGNOSIS — I10 Essential (primary) hypertension: Secondary | ICD-10-CM | POA: Diagnosis not present

## 2017-09-06 DIAGNOSIS — F339 Major depressive disorder, recurrent, unspecified: Secondary | ICD-10-CM | POA: Diagnosis not present

## 2017-09-06 DIAGNOSIS — Z9181 History of falling: Secondary | ICD-10-CM | POA: Diagnosis not present

## 2017-09-06 DIAGNOSIS — Z7982 Long term (current) use of aspirin: Secondary | ICD-10-CM | POA: Diagnosis not present

## 2017-09-06 DIAGNOSIS — I69320 Aphasia following cerebral infarction: Secondary | ICD-10-CM | POA: Diagnosis not present

## 2017-09-06 DIAGNOSIS — M810 Age-related osteoporosis without current pathological fracture: Secondary | ICD-10-CM | POA: Diagnosis not present

## 2017-09-06 DIAGNOSIS — I69393 Ataxia following cerebral infarction: Secondary | ICD-10-CM | POA: Diagnosis not present

## 2017-09-07 DIAGNOSIS — I10 Essential (primary) hypertension: Secondary | ICD-10-CM | POA: Diagnosis not present

## 2017-09-07 DIAGNOSIS — Z7982 Long term (current) use of aspirin: Secondary | ICD-10-CM | POA: Diagnosis not present

## 2017-09-07 DIAGNOSIS — I69320 Aphasia following cerebral infarction: Secondary | ICD-10-CM | POA: Diagnosis not present

## 2017-09-07 DIAGNOSIS — F339 Major depressive disorder, recurrent, unspecified: Secondary | ICD-10-CM | POA: Diagnosis not present

## 2017-09-07 DIAGNOSIS — I69393 Ataxia following cerebral infarction: Secondary | ICD-10-CM | POA: Diagnosis not present

## 2017-09-07 DIAGNOSIS — F419 Anxiety disorder, unspecified: Secondary | ICD-10-CM | POA: Diagnosis not present

## 2017-09-07 DIAGNOSIS — I69322 Dysarthria following cerebral infarction: Secondary | ICD-10-CM | POA: Diagnosis not present

## 2017-09-07 DIAGNOSIS — M810 Age-related osteoporosis without current pathological fracture: Secondary | ICD-10-CM | POA: Diagnosis not present

## 2017-09-07 DIAGNOSIS — Z9181 History of falling: Secondary | ICD-10-CM | POA: Diagnosis not present

## 2017-09-10 DIAGNOSIS — H2511 Age-related nuclear cataract, right eye: Secondary | ICD-10-CM | POA: Diagnosis not present

## 2017-09-10 DIAGNOSIS — H25011 Cortical age-related cataract, right eye: Secondary | ICD-10-CM | POA: Diagnosis not present

## 2017-09-18 DIAGNOSIS — H25811 Combined forms of age-related cataract, right eye: Secondary | ICD-10-CM | POA: Diagnosis not present

## 2017-09-18 DIAGNOSIS — H2511 Age-related nuclear cataract, right eye: Secondary | ICD-10-CM | POA: Diagnosis not present

## 2017-09-25 DIAGNOSIS — H2511 Age-related nuclear cataract, right eye: Secondary | ICD-10-CM | POA: Diagnosis not present

## 2017-10-24 DIAGNOSIS — F329 Major depressive disorder, single episode, unspecified: Secondary | ICD-10-CM | POA: Diagnosis not present

## 2017-10-24 DIAGNOSIS — E782 Mixed hyperlipidemia: Secondary | ICD-10-CM | POA: Diagnosis not present

## 2017-10-24 DIAGNOSIS — F411 Generalized anxiety disorder: Secondary | ICD-10-CM | POA: Diagnosis not present

## 2017-10-24 DIAGNOSIS — F325 Major depressive disorder, single episode, in full remission: Secondary | ICD-10-CM | POA: Diagnosis not present

## 2017-10-24 DIAGNOSIS — M810 Age-related osteoporosis without current pathological fracture: Secondary | ICD-10-CM | POA: Diagnosis not present

## 2017-10-24 DIAGNOSIS — Z8673 Personal history of transient ischemic attack (TIA), and cerebral infarction without residual deficits: Secondary | ICD-10-CM | POA: Diagnosis not present

## 2017-10-24 DIAGNOSIS — I10 Essential (primary) hypertension: Secondary | ICD-10-CM | POA: Diagnosis not present

## 2017-10-24 DIAGNOSIS — Z1321 Encounter for screening for nutritional disorder: Secondary | ICD-10-CM | POA: Diagnosis not present

## 2017-10-25 DIAGNOSIS — M810 Age-related osteoporosis without current pathological fracture: Secondary | ICD-10-CM | POA: Diagnosis not present

## 2017-10-25 DIAGNOSIS — I10 Essential (primary) hypertension: Secondary | ICD-10-CM | POA: Diagnosis not present

## 2017-10-25 DIAGNOSIS — Z8673 Personal history of transient ischemic attack (TIA), and cerebral infarction without residual deficits: Secondary | ICD-10-CM | POA: Diagnosis not present

## 2017-10-25 DIAGNOSIS — F325 Major depressive disorder, single episode, in full remission: Secondary | ICD-10-CM | POA: Diagnosis not present

## 2017-10-25 DIAGNOSIS — F411 Generalized anxiety disorder: Secondary | ICD-10-CM | POA: Diagnosis not present

## 2017-10-25 DIAGNOSIS — Z1321 Encounter for screening for nutritional disorder: Secondary | ICD-10-CM | POA: Diagnosis not present

## 2017-10-25 DIAGNOSIS — E782 Mixed hyperlipidemia: Secondary | ICD-10-CM | POA: Diagnosis not present

## 2017-10-25 DIAGNOSIS — F329 Major depressive disorder, single episode, unspecified: Secondary | ICD-10-CM | POA: Diagnosis not present

## 2017-10-29 DIAGNOSIS — R41841 Cognitive communication deficit: Secondary | ICD-10-CM | POA: Diagnosis not present

## 2017-10-29 DIAGNOSIS — Z9181 History of falling: Secondary | ICD-10-CM | POA: Diagnosis not present

## 2017-10-29 DIAGNOSIS — Z7982 Long term (current) use of aspirin: Secondary | ICD-10-CM | POA: Diagnosis not present

## 2017-10-29 DIAGNOSIS — I69322 Dysarthria following cerebral infarction: Secondary | ICD-10-CM | POA: Diagnosis not present

## 2017-10-29 DIAGNOSIS — I69393 Ataxia following cerebral infarction: Secondary | ICD-10-CM | POA: Diagnosis not present

## 2017-10-29 DIAGNOSIS — I10 Essential (primary) hypertension: Secondary | ICD-10-CM | POA: Diagnosis not present

## 2017-10-30 DIAGNOSIS — I69322 Dysarthria following cerebral infarction: Secondary | ICD-10-CM | POA: Diagnosis not present

## 2017-10-30 DIAGNOSIS — R41841 Cognitive communication deficit: Secondary | ICD-10-CM | POA: Diagnosis not present

## 2017-10-30 DIAGNOSIS — Z7982 Long term (current) use of aspirin: Secondary | ICD-10-CM | POA: Diagnosis not present

## 2017-10-30 DIAGNOSIS — Z9181 History of falling: Secondary | ICD-10-CM | POA: Diagnosis not present

## 2017-10-30 DIAGNOSIS — I69393 Ataxia following cerebral infarction: Secondary | ICD-10-CM | POA: Diagnosis not present

## 2017-10-30 DIAGNOSIS — I10 Essential (primary) hypertension: Secondary | ICD-10-CM | POA: Diagnosis not present

## 2017-11-01 DIAGNOSIS — I69393 Ataxia following cerebral infarction: Secondary | ICD-10-CM | POA: Diagnosis not present

## 2017-11-01 DIAGNOSIS — Z9181 History of falling: Secondary | ICD-10-CM | POA: Diagnosis not present

## 2017-11-01 DIAGNOSIS — Z7982 Long term (current) use of aspirin: Secondary | ICD-10-CM | POA: Diagnosis not present

## 2017-11-01 DIAGNOSIS — I10 Essential (primary) hypertension: Secondary | ICD-10-CM | POA: Diagnosis not present

## 2017-11-01 DIAGNOSIS — R41841 Cognitive communication deficit: Secondary | ICD-10-CM | POA: Diagnosis not present

## 2017-11-01 DIAGNOSIS — I69322 Dysarthria following cerebral infarction: Secondary | ICD-10-CM | POA: Diagnosis not present

## 2017-11-02 DIAGNOSIS — Z7982 Long term (current) use of aspirin: Secondary | ICD-10-CM | POA: Diagnosis not present

## 2017-11-02 DIAGNOSIS — I69393 Ataxia following cerebral infarction: Secondary | ICD-10-CM | POA: Diagnosis not present

## 2017-11-02 DIAGNOSIS — I69322 Dysarthria following cerebral infarction: Secondary | ICD-10-CM | POA: Diagnosis not present

## 2017-11-02 DIAGNOSIS — Z9181 History of falling: Secondary | ICD-10-CM | POA: Diagnosis not present

## 2017-11-02 DIAGNOSIS — I10 Essential (primary) hypertension: Secondary | ICD-10-CM | POA: Diagnosis not present

## 2017-11-02 DIAGNOSIS — R41841 Cognitive communication deficit: Secondary | ICD-10-CM | POA: Diagnosis not present

## 2017-11-05 DIAGNOSIS — I10 Essential (primary) hypertension: Secondary | ICD-10-CM | POA: Diagnosis not present

## 2017-11-05 DIAGNOSIS — I69322 Dysarthria following cerebral infarction: Secondary | ICD-10-CM | POA: Diagnosis not present

## 2017-11-05 DIAGNOSIS — Z9181 History of falling: Secondary | ICD-10-CM | POA: Diagnosis not present

## 2017-11-05 DIAGNOSIS — I69393 Ataxia following cerebral infarction: Secondary | ICD-10-CM | POA: Diagnosis not present

## 2017-11-05 DIAGNOSIS — R41841 Cognitive communication deficit: Secondary | ICD-10-CM | POA: Diagnosis not present

## 2017-11-05 DIAGNOSIS — Z7982 Long term (current) use of aspirin: Secondary | ICD-10-CM | POA: Diagnosis not present

## 2017-11-06 DIAGNOSIS — I10 Essential (primary) hypertension: Secondary | ICD-10-CM | POA: Diagnosis not present

## 2017-11-06 DIAGNOSIS — Z9181 History of falling: Secondary | ICD-10-CM | POA: Diagnosis not present

## 2017-11-06 DIAGNOSIS — I69393 Ataxia following cerebral infarction: Secondary | ICD-10-CM | POA: Diagnosis not present

## 2017-11-06 DIAGNOSIS — I69322 Dysarthria following cerebral infarction: Secondary | ICD-10-CM | POA: Diagnosis not present

## 2017-11-06 DIAGNOSIS — Z7982 Long term (current) use of aspirin: Secondary | ICD-10-CM | POA: Diagnosis not present

## 2017-11-06 DIAGNOSIS — R41841 Cognitive communication deficit: Secondary | ICD-10-CM | POA: Diagnosis not present

## 2017-11-08 DIAGNOSIS — I69322 Dysarthria following cerebral infarction: Secondary | ICD-10-CM | POA: Diagnosis not present

## 2017-11-08 DIAGNOSIS — R41841 Cognitive communication deficit: Secondary | ICD-10-CM | POA: Diagnosis not present

## 2017-11-08 DIAGNOSIS — I69393 Ataxia following cerebral infarction: Secondary | ICD-10-CM | POA: Diagnosis not present

## 2017-11-08 DIAGNOSIS — I10 Essential (primary) hypertension: Secondary | ICD-10-CM | POA: Diagnosis not present

## 2017-11-08 DIAGNOSIS — Z9181 History of falling: Secondary | ICD-10-CM | POA: Diagnosis not present

## 2017-11-08 DIAGNOSIS — Z7982 Long term (current) use of aspirin: Secondary | ICD-10-CM | POA: Diagnosis not present

## 2017-11-12 DIAGNOSIS — I69393 Ataxia following cerebral infarction: Secondary | ICD-10-CM | POA: Diagnosis not present

## 2017-11-12 DIAGNOSIS — Z7982 Long term (current) use of aspirin: Secondary | ICD-10-CM | POA: Diagnosis not present

## 2017-11-12 DIAGNOSIS — Z9181 History of falling: Secondary | ICD-10-CM | POA: Diagnosis not present

## 2017-11-12 DIAGNOSIS — I10 Essential (primary) hypertension: Secondary | ICD-10-CM | POA: Diagnosis not present

## 2017-11-12 DIAGNOSIS — I69322 Dysarthria following cerebral infarction: Secondary | ICD-10-CM | POA: Diagnosis not present

## 2017-11-12 DIAGNOSIS — R41841 Cognitive communication deficit: Secondary | ICD-10-CM | POA: Diagnosis not present

## 2017-11-13 DIAGNOSIS — R41841 Cognitive communication deficit: Secondary | ICD-10-CM | POA: Diagnosis not present

## 2017-11-13 DIAGNOSIS — I69322 Dysarthria following cerebral infarction: Secondary | ICD-10-CM | POA: Diagnosis not present

## 2017-11-13 DIAGNOSIS — I10 Essential (primary) hypertension: Secondary | ICD-10-CM | POA: Diagnosis not present

## 2017-11-13 DIAGNOSIS — I69393 Ataxia following cerebral infarction: Secondary | ICD-10-CM | POA: Diagnosis not present

## 2017-11-13 DIAGNOSIS — Z9181 History of falling: Secondary | ICD-10-CM | POA: Diagnosis not present

## 2017-11-13 DIAGNOSIS — Z7982 Long term (current) use of aspirin: Secondary | ICD-10-CM | POA: Diagnosis not present

## 2017-11-14 DIAGNOSIS — Z9181 History of falling: Secondary | ICD-10-CM | POA: Diagnosis not present

## 2017-11-14 DIAGNOSIS — Z7982 Long term (current) use of aspirin: Secondary | ICD-10-CM | POA: Diagnosis not present

## 2017-11-14 DIAGNOSIS — I10 Essential (primary) hypertension: Secondary | ICD-10-CM | POA: Diagnosis not present

## 2017-11-14 DIAGNOSIS — I69393 Ataxia following cerebral infarction: Secondary | ICD-10-CM | POA: Diagnosis not present

## 2017-11-14 DIAGNOSIS — I69322 Dysarthria following cerebral infarction: Secondary | ICD-10-CM | POA: Diagnosis not present

## 2017-11-14 DIAGNOSIS — R41841 Cognitive communication deficit: Secondary | ICD-10-CM | POA: Diagnosis not present

## 2017-11-19 DIAGNOSIS — R41841 Cognitive communication deficit: Secondary | ICD-10-CM | POA: Diagnosis not present

## 2017-11-19 DIAGNOSIS — I69322 Dysarthria following cerebral infarction: Secondary | ICD-10-CM | POA: Diagnosis not present

## 2017-11-19 DIAGNOSIS — I69393 Ataxia following cerebral infarction: Secondary | ICD-10-CM | POA: Diagnosis not present

## 2017-11-19 DIAGNOSIS — Z7982 Long term (current) use of aspirin: Secondary | ICD-10-CM | POA: Diagnosis not present

## 2017-11-19 DIAGNOSIS — Z9181 History of falling: Secondary | ICD-10-CM | POA: Diagnosis not present

## 2017-11-19 DIAGNOSIS — I10 Essential (primary) hypertension: Secondary | ICD-10-CM | POA: Diagnosis not present

## 2017-11-21 DIAGNOSIS — Z7982 Long term (current) use of aspirin: Secondary | ICD-10-CM | POA: Diagnosis not present

## 2017-11-21 DIAGNOSIS — R41841 Cognitive communication deficit: Secondary | ICD-10-CM | POA: Diagnosis not present

## 2017-11-21 DIAGNOSIS — I69393 Ataxia following cerebral infarction: Secondary | ICD-10-CM | POA: Diagnosis not present

## 2017-11-21 DIAGNOSIS — I69322 Dysarthria following cerebral infarction: Secondary | ICD-10-CM | POA: Diagnosis not present

## 2017-11-21 DIAGNOSIS — Z9181 History of falling: Secondary | ICD-10-CM | POA: Diagnosis not present

## 2017-11-21 DIAGNOSIS — I10 Essential (primary) hypertension: Secondary | ICD-10-CM | POA: Diagnosis not present

## 2017-11-24 DIAGNOSIS — Z9181 History of falling: Secondary | ICD-10-CM | POA: Diagnosis not present

## 2017-11-24 DIAGNOSIS — Z7982 Long term (current) use of aspirin: Secondary | ICD-10-CM | POA: Diagnosis not present

## 2017-11-24 DIAGNOSIS — I10 Essential (primary) hypertension: Secondary | ICD-10-CM | POA: Diagnosis not present

## 2017-11-24 DIAGNOSIS — I69322 Dysarthria following cerebral infarction: Secondary | ICD-10-CM | POA: Diagnosis not present

## 2017-11-24 DIAGNOSIS — I69393 Ataxia following cerebral infarction: Secondary | ICD-10-CM | POA: Diagnosis not present

## 2017-11-24 DIAGNOSIS — R41841 Cognitive communication deficit: Secondary | ICD-10-CM | POA: Diagnosis not present

## 2017-11-28 DIAGNOSIS — Z9181 History of falling: Secondary | ICD-10-CM | POA: Diagnosis not present

## 2017-11-28 DIAGNOSIS — I69393 Ataxia following cerebral infarction: Secondary | ICD-10-CM | POA: Diagnosis not present

## 2017-11-28 DIAGNOSIS — I10 Essential (primary) hypertension: Secondary | ICD-10-CM | POA: Diagnosis not present

## 2017-11-28 DIAGNOSIS — R41841 Cognitive communication deficit: Secondary | ICD-10-CM | POA: Diagnosis not present

## 2017-11-28 DIAGNOSIS — Z7982 Long term (current) use of aspirin: Secondary | ICD-10-CM | POA: Diagnosis not present

## 2017-11-28 DIAGNOSIS — I69322 Dysarthria following cerebral infarction: Secondary | ICD-10-CM | POA: Diagnosis not present

## 2017-12-03 DIAGNOSIS — M1711 Unilateral primary osteoarthritis, right knee: Secondary | ICD-10-CM | POA: Diagnosis not present

## 2017-12-04 ENCOUNTER — Emergency Department: Payer: Medicare HMO

## 2017-12-04 ENCOUNTER — Other Ambulatory Visit: Payer: Self-pay

## 2017-12-04 ENCOUNTER — Emergency Department
Admission: EM | Admit: 2017-12-04 | Discharge: 2017-12-05 | Disposition: A | Discharge status: Home / Self Care | Payer: Medicare HMO | Attending: Emergency Medicine | Admitting: Emergency Medicine

## 2017-12-04 DIAGNOSIS — G309 Alzheimer's disease, unspecified: Secondary | ICD-10-CM | POA: Diagnosis not present

## 2017-12-04 DIAGNOSIS — R4689 Other symptoms and signs involving appearance and behavior: Secondary | ICD-10-CM | POA: Diagnosis not present

## 2017-12-04 DIAGNOSIS — F0150 Vascular dementia without behavioral disturbance: Secondary | ICD-10-CM

## 2017-12-04 DIAGNOSIS — R42 Dizziness and giddiness: Secondary | ICD-10-CM | POA: Diagnosis not present

## 2017-12-04 DIAGNOSIS — F4322 Adjustment disorder with anxiety: Secondary | ICD-10-CM

## 2017-12-04 DIAGNOSIS — Z7902 Long term (current) use of antithrombotics/antiplatelets: Secondary | ICD-10-CM | POA: Insufficient documentation

## 2017-12-04 DIAGNOSIS — R319 Hematuria, unspecified: Secondary | ICD-10-CM | POA: Insufficient documentation

## 2017-12-04 DIAGNOSIS — Z79899 Other long term (current) drug therapy: Secondary | ICD-10-CM | POA: Diagnosis not present

## 2017-12-04 DIAGNOSIS — N390 Urinary tract infection, site not specified: Secondary | ICD-10-CM | POA: Diagnosis not present

## 2017-12-04 DIAGNOSIS — R079 Chest pain, unspecified: Secondary | ICD-10-CM

## 2017-12-04 DIAGNOSIS — Z7982 Long term (current) use of aspirin: Secondary | ICD-10-CM | POA: Diagnosis not present

## 2017-12-04 DIAGNOSIS — I10 Essential (primary) hypertension: Secondary | ICD-10-CM | POA: Diagnosis not present

## 2017-12-04 DIAGNOSIS — R4020 Unspecified coma: Secondary | ICD-10-CM | POA: Diagnosis not present

## 2017-12-04 DIAGNOSIS — R0789 Other chest pain: Secondary | ICD-10-CM | POA: Diagnosis not present

## 2017-12-04 DIAGNOSIS — Z9183 Wandering in diseases classified elsewhere: Secondary | ICD-10-CM | POA: Diagnosis not present

## 2017-12-04 LAB — CBC WITH DIFFERENTIAL/PLATELET
BASOS ABS: 0.1 10*3/uL (ref 0–0.1)
BASOS PCT: 1 %
EOS PCT: 2 %
Eosinophils Absolute: 0.2 10*3/uL (ref 0–0.7)
HCT: 37.1 % (ref 35.0–47.0)
Hemoglobin: 12.4 g/dL (ref 12.0–16.0)
LYMPHS PCT: 29 %
Lymphs Abs: 2.5 10*3/uL (ref 1.0–3.6)
MCH: 29 pg (ref 26.0–34.0)
MCHC: 33.3 g/dL (ref 32.0–36.0)
MCV: 87.1 fL (ref 80.0–100.0)
MONO ABS: 0.6 10*3/uL (ref 0.2–0.9)
Monocytes Relative: 7 %
NEUTROS ABS: 5.3 10*3/uL (ref 1.4–6.5)
Neutrophils Relative %: 61 %
PLATELETS: 312 10*3/uL (ref 150–440)
RBC: 4.26 MIL/uL (ref 3.80–5.20)
RDW: 14.7 % — AB (ref 11.5–14.5)
WBC: 8.6 10*3/uL (ref 3.6–11.0)

## 2017-12-04 LAB — COMPREHENSIVE METABOLIC PANEL
ALT: 16 U/L (ref 14–54)
AST: 25 U/L (ref 15–41)
Albumin: 3.4 g/dL — ABNORMAL LOW (ref 3.5–5.0)
Alkaline Phosphatase: 41 U/L (ref 38–126)
Anion gap: 9 (ref 5–15)
BUN: 16 mg/dL (ref 6–20)
CHLORIDE: 105 mmol/L (ref 101–111)
CO2: 25 mmol/L (ref 22–32)
CREATININE: 0.81 mg/dL (ref 0.44–1.00)
Calcium: 8.8 mg/dL — ABNORMAL LOW (ref 8.9–10.3)
GFR calc Af Amer: >60 mL/min (ref >60)
Glucose, Bld: 105 mg/dL — ABNORMAL HIGH (ref 65–99)
POTASSIUM: 3.6 mmol/L (ref 3.5–5.1)
Sodium: 139 mmol/L (ref 135–145)
TOTAL PROTEIN: 6.8 g/dL (ref 6.5–8.1)
Total Bilirubin: 0.4 mg/dL (ref 0.3–1.2)

## 2017-12-04 LAB — URINALYSIS, COMPLETE (UACMP) WITH MICROSCOPIC
Bilirubin Urine: NEGATIVE
Glucose, UA: NEGATIVE mg/dL
Ketones, ur: NEGATIVE mg/dL
Nitrite: NEGATIVE
PH: 7 (ref 5.0–8.0)
Protein, ur: NEGATIVE mg/dL
SPECIFIC GRAVITY, URINE: 1.015 (ref 1.005–1.030)

## 2017-12-04 LAB — TROPONIN I: Troponin I: <0.03 ng/mL (ref <0.03)

## 2017-12-04 LAB — LIPASE, BLOOD: LIPASE: 24 U/L (ref 11–51)

## 2017-12-04 LAB — TSH: TSH: 1.203 u[IU]/mL (ref 0.350–4.500)

## 2017-12-04 MED ORDER — CARVEDILOL 6.25 MG PO TABS
12.5000 mg | ORAL_TABLET | Freq: Two times a day (BID) | ORAL | Status: DC
  Administered 2017-12-05: 12.5 mg via ORAL
  Filled 2017-12-04: qty 2

## 2017-12-04 MED ORDER — LORAZEPAM 2 MG PO TABS
2.0000 mg | ORAL_TABLET | Freq: Once | ORAL | Status: AC
  Administered 2017-12-04: 2 mg via ORAL
  Filled 2017-12-04: qty 1

## 2017-12-04 MED ORDER — CEFTRIAXONE SODIUM 1 G IJ SOLR
1.0000 g | Freq: Once | INTRAMUSCULAR | Status: AC
  Administered 2017-12-04: 1 g via INTRAVENOUS
  Filled 2017-12-04: qty 10
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
## 2017-12-04 NOTE — Discharge Instructions (Signed)
Please call the cardiologist Dr. Darrold JunkerParaschos  and schedule a follow-up appointment. If you tell them that you were seen in the emergency room with chest pain they should be able to get you In very quickly. Please return for any other symptoms.
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
## 2017-12-04 NOTE — ED Triage Notes (Signed)
[facility] d/t chest pain. EMS reports giving pt 4 asprin and 1 Sublingual Nitro spray prior to arrival. Pt denies any chest pain at this time but does c/o dizziness and nausea. Pt has hx of hypertension and CVA. Pt is A&O x4 at this time.

## 2017-12-05 ENCOUNTER — Telehealth: Payer: Self-pay | Admitting: Family Medicine

## 2017-12-05 ENCOUNTER — Ambulatory Visit (INDEPENDENT_AMBULATORY_CARE_PROVIDER_SITE_OTHER): Payer: Medicare HMO | Admitting: Family Medicine

## 2017-12-05 ENCOUNTER — Encounter: Payer: Self-pay | Admitting: Family Medicine

## 2017-12-05 VITALS — BP 110/80 | HR 75 | Resp 14 | Ht 62.0 in | Wt 156.6 lb

## 2017-12-05 DIAGNOSIS — E7849 Other hyperlipidemia: Secondary | ICD-10-CM

## 2017-12-05 DIAGNOSIS — Z7689 Persons encountering health services in other specified circumstances: Secondary | ICD-10-CM | POA: Diagnosis not present

## 2017-12-05 DIAGNOSIS — I6930 Unspecified sequelae of cerebral infarction: Secondary | ICD-10-CM

## 2017-12-05 DIAGNOSIS — F01518 Vascular dementia, unspecified severity, with other behavioral disturbance: Secondary | ICD-10-CM

## 2017-12-05 DIAGNOSIS — N3001 Acute cystitis with hematuria: Secondary | ICD-10-CM | POA: Diagnosis not present

## 2017-12-05 DIAGNOSIS — F339 Major depressive disorder, recurrent, unspecified: Secondary | ICD-10-CM | POA: Diagnosis not present

## 2017-12-05 DIAGNOSIS — I10 Essential (primary) hypertension: Secondary | ICD-10-CM | POA: Diagnosis not present

## 2017-12-05 DIAGNOSIS — F0151 Vascular dementia with behavioral disturbance: Secondary | ICD-10-CM | POA: Diagnosis not present

## 2017-12-05 MED ORDER — FLUOXETINE HCL 20 MG PO CAPS
20.0000 mg | ORAL_CAPSULE | Freq: Every day | ORAL | Status: DC
  Administered 2017-12-05: 20 mg via ORAL
  Filled 2017-12-05: qty 1

## 2017-12-05 MED ORDER — CEPHALEXIN 500 MG PO CAPS: 500.0000 mg | ORAL_CAPSULE | Freq: Three times a day (TID) | ORAL | 0 refills | Status: AC

## 2017-12-05 MED ORDER — DIAZEPAM 2 MG PO TABS: 1.0000 mg | ORAL_TABLET | Freq: Every evening | ORAL | Status: DC | PRN

## 2017-12-05 MED ORDER — LISINOPRIL 10 MG PO TABS
40.0000 mg | ORAL_TABLET | Freq: Every day | ORAL | Status: DC
  Administered 2017-12-05: 40 mg via ORAL
  Filled 2017-12-05: qty 4

## 2017-12-05 MED ORDER — AMLODIPINE BESYLATE 5 MG PO TABS
5.0000 mg | ORAL_TABLET | Freq: Every day | ORAL | Status: DC
  Administered 2017-12-05: 5 mg via ORAL
  Filled 2017-12-05: qty 1

## 2017-12-05 MED ORDER — CARVEDILOL 12.5 MG PO TABS: 12.5000 mg | ORAL_TABLET | Freq: Two times a day (BID) | ORAL | 2 refills | Status: DC

## 2017-12-05 NOTE — ED Notes (Signed)
RN spoke to patient's daughter to give an update. Daughter understood her mother will have social work consult before possible discharge.  Patient's daughter would like for patient to be discharged in time to make a 3pm primary care appointment (if placement is not an option).

## 2017-12-05 NOTE — Patient Instructions (Addendum)
Thank you for coming to the office today.  Start Keflex tomorrow finish entire  Tonight take ONE pill of Coreg 6.25mg  as usual - SKIP the Lisinopril 40 tonight  TOMORROW Thursday 6/6 - START HIGHER DOSE Coreg 12.5mg  one pill twice daily - RESUME Lisinopril 40mg  at night as usual  -----------------  [facility] (resources, through [facility], Care Management, assist with home health / family caregivers, local community resources, PDF educational resource)  Requirement for Care Management - NOTE: In order to be eligible for Care Management, the individual must be 78 years of age or older, have impairments in two activities of daily living, have complicated medical or behavioral impairments, and prefer to be cared for at home.  ?[facility] [address] [place], [place] [address]  Phone: [phone] [email or web address]  ----------  Call to check to see if they would follow-up for Dementia, to get updates, new doctor here and may need updated MRI  Dr Delia HeadyPramod Sethi (last visit 2017)  [facility] [address], [address] [place], [place] [address] [phone]   Please schedule a Follow-up Appointment to: Return in about 6 weeks (around 01/16/2018) for Blood Pressure med adjust, PT orders / Dementia.  If you have any other questions or concerns, please feel free to call the office or send a message through MyChart. You may also schedule an earlier appointment if necessary.  Additionally, you may be receiving a survey about your experience at our office within a few days to 1 week by e-mail or mail. We value your feedback.  Saralyn PilarAlexander Karamalegos, DO [facility]

## 2017-12-05 NOTE — ED Notes (Signed)
Patient's daughter Lawson FiscalLori [phone]) called this Clinical research associatewriter to check on patient. She states she would like to be here for when Social work come in to see patient.

## 2017-12-05 NOTE — Assessment & Plan Note (Signed)
Chronic gradual progression of dementia, previously followed by Neurology [facility] with diagnosis most likely supported of Vascular Dementia w/ multiple CVA in past - Additional imaging on file MRI supportive of some Alzheimer's component - Clinically FAST Stage 4-5 (needs help with ADLs), some history of behavioral disturbance now without evidence of harm or danger to self/others. - but now with some wandering behavior - Increased caregiver burden - Meds: None for cognition - on SSRI  Plan: 1. Reviewed Dementia prognosis and management, explained that may need additional support in future. Provided resources and contact info for [facility], given not FAST 6-7 will defer Hospice / Palliatiave - [facility] for [facility] evaluation and monitoring, may need update MRI - Will reach out [facility] to renew services if possible - Consider future referral [facility] to connect with other resources, may need PCS and Care Management if unable to live independently w/ family support

## 2017-12-05 NOTE — ED Notes (Signed)
Pt has had social work consult and will be discharged today.

## 2017-12-05 NOTE — ED Notes (Signed)
Pt eating breakfast. Calm and cooperative. Maintained on 15 minute checks and observation by security camera for safety.

## 2017-12-05 NOTE — Assessment & Plan Note (Signed)
Concern with significantly elevated PHQ9 today, compared to previous values, difficulty in accurate score due to dementia. - Recent psych eval in ED, patient was cleared for discharge - Clinically her mood is appropriate today in office and does not entirely seem consistent with her high PHQ score - Continues on SSRI Fluoxetine 20mg  daily  Plan Treat underlying acute UTI likely affecting some cognition Follow-up with Neurology to further diagnose dementia Caregiver resources given [facility] Future discuss SSRI adjust and other meds in future once medically stable, new visit today did not adjust dose [place] on avoid Xanax and BDZ in elderly 77 year old patient who has dementia, reviewed safety risks and advised not to take existing rx will not rx

## 2017-12-05 NOTE — Assessment & Plan Note (Signed)
Suspected underlying etiology for contributing to vascular dementia No recent CVA. Recent Head CT negative for acute CVA Residual cognitive deficits, some generalized weakness, non focal Improving gradually with PT, has completed OT / SLP [facility] Dr Pearlean BrownieSethi, last MRI 2017 Recommended that she contact them and follow-up again for re-evaluation, given cognitive decline

## 2017-12-05 NOTE — Assessment & Plan Note (Addendum)
Currently low normal BP, history and recent ED overnight with SBP >190 - Home BP readings limited but elevated  Complicated by history of CVAs, Vascular Dementia Failed Amlodipine 5mg  due to edema. Failed HCTZ (side effect, dizzy)    Plan:  1. INCREASE Carvedilol from 6.25mg  BID to 12.5mg  BID - new rx sent - new patient did not want to start new med, agree to stick with current med dose adjust - Continue Lisinopril 40mg  daily - Instructions per AVS when to take next doses 2. Encourage improved lifestyle - low sodium diet, regular exercise stay active 3. Start monitor BP outside office, bring readings to next visit, if persistently >140/90 or new symptoms notify office sooner 4. Follow-up 4-6 weeks - future consider Hydralazine

## 2017-12-05 NOTE — Telephone Encounter (Signed)
This is new patient established with me today 12/05/17.  Previous PCP was Dr Harlene Saltsavid [facility]) he has [facility] - now receiving PT still.  They were advised to have us update her PT orders since she has new PCP now.  Could you call [facility] to see if they can fax us any paperwork or orders to [facility], now that I am new PCP?  Phone: [phone]  Saralyn PilarAlexander Haileyann Staiger, DO [facility] 12/05/2017, 4:37 PM

## 2017-12-05 NOTE — Clinical Social Work Note (Addendum)
CSW received consult for "possible placement can't be cared for at home." [facility]. Patient has Dementia. CSW staffed with EDRN Amy and EDP Dr. Roxan Hockeyobinson. CSW spoke with patient's daughter-[name] Lissa HoardBoone [phone]) and informed her that patient is ready for discharge. CSW confirmed that CSW would not be placing patient from the ED, but provided daughter with alternative options for placement of patient from home. Daughter states patient "does not wander." Daughter states patient "was just angry and wanted to leave." Daughter states patient does well "on her own" and does not need a memory or Assisted Living Facility. CSW discussed PACE services with daughter. Daughter very interested in PACE for patient and agreeable to CSW making a referral. CSW and daughter also discussed private duty Nursing/Companion care. Daughter states she will pick up patient in 30 minutes. Daughter appreciative of CSW assistance. CSW completed PACE referral. CSW updated EDRN Amy and provided her with PACE information, Private Duty Nursing list, and placement process resource. CSW signing off as no further Social Work intervention required.   Corlis HoveJeneya Edyth Glomb, Theresia MajorsLCSWA, [facility] Clinical Social Worker-ED [phone]

## 2017-12-05 NOTE — Progress Notes (Signed)
Subjective:    Patient ID: Jill Hancock, female    DOB: February 02, 1941, 77 y.o.   MRN: [id number]  Jill Hancock is a 77 y.o. female presenting on 12/05/2017 for Establish Care (ER yesterday for chest pain and UTI ); Urinary Tract Infection; Dementia; and Hypertension  Previously followed by Dr Marcelino Duster at [facility] most recently, previously followed by [facility] at hospital for >6+ years in [place]. Now relocated to [place]. Lives near her daughter, Jill Hancock, who is present today and provides additional history.  HPI   ED FOLLOW-UP VISIT  Hospital/Location: [facility] ED Date of ED Visit: 12/04/17 > 12/05/17  Reason for Presenting to ED: Dizziness, Chest Pain, Wandering Behaviora / Confusion Primary (+Secondary) Diagnosis: UTI, Dementia  FOLLOW-UP - ED provider note and record have been reviewed - Patient presents today within 24 hours of discharge (earlier today 12/05/17 AM) from ED. Brief summary of recent course, patient had symptoms of recent dizziness and non specific chest pain that prompted concern additionally daughter reported concern of wandering behavior and some agitation at times, testing in ED with EKG and troponin lab unremarkable, chemistry CBC TSH were unremarkable, also work-up with CXR negative, and Head CT w/o acute stroke but evidence of chronic vascular changes and chronic ischemic changes (see report below and chart review), also UA was suggestive of UTI, treated with Ceftriaxone IV 1g and Ativan. She was hypertensive with acute elevation up to SBP 190, given Carvedilol 12.5mg  and Amlodipine 5mg  and Lisinopril 40mg  - some of her own meds since she was at hospital overnight. Also discharged with printed rx for Keflex - did not start this yet has not filled  Additionally in hospital patient had Psychiatry eval by Dr Mordecai Rasmussen, note was reviewed, ultimately she was determined to have cognitive deficit, dementia based on history  and exam, not determined to be safety risk, was not IVC, previously treated by other PCP with low dose Xanax but not taking  - Today reports overall has done well after discharge from ED. Symptoms of dizziness and chest pain have resolved. - Regarding UTI symptoms, patient endorsed dysuria and urinary frequency, similar to prior UTI. She was treated within past 1 month by their report but do not see this on EHR record. Unsure which antibiotic, has tolerated Keflex and Ceftriaxone before, has unknown exact etiology PCN Allergy listed. - Denies fevers, chills, flank pain, nausea vomiting abdominal pain  - Regarding Hypertension, her BP now today is improved lower normal range in office now, she does not check regularly, HH PT will check and daughter can check occasionally, readings usually higher, in past prior PCP tried adjusting meds to control BP, previously best controlled on Amlodipine 5mg  and Lisinopril 40mg  but then she developed significant LE edema d/t amlodipine and this was discontinued, swelling resolved but BP uncontrolled, they added Carvedilol 3.125mg  BID then gradual inc to 6.25mg  still not effective. 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 She was discharged from prior OT and SLP ordered by previous PCP - Needs renewal for PT Patient lives in an apartment, recently moved to [facility] apartment in [place], previously lived in [place], living location for age >55 or disabled, currently in independent living. Her daughter Jill Hancock and other sister are living locally and available. - Patient able to perform some but not all ADLs, she can dress herself, does sponge bathing but not actual bath, can do small basic meal prep sandwiches microwave etc, wears adult diapers with some urinary soiling. 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 - followed by [facility], has received cortisone injections in past, now receiving synvisc type injections 1 of 3 done  Depression screen [facility] 2/9 12/05/2017 07/18/2016 07/05/2016  Decreased Interest 3 0 0  Down, Depressed, Hopeless 3 1 1   PHQ - 2 Score 6 1 1   Altered sleeping 3 - -  Tired, decreased energy 2 - -  Change in appetite 2 - -  Feeling bad or failure about yourself  3 - -  Trouble concentrating 3 - -  Moving slowly or fidgety/restless 2 - -  Suicidal thoughts 0 - -  PHQ-9 Score 21 - -  Difficult doing work/chores Very difficult - -  Some recent data might be hidden    Past Medical History:  Diagnosis Date  . Hyperglycemia   . Hypertension   . Hypokalemia   . Stroke [facility]) 02/2012   MRI revealed at least 3 subcentimeter acute infarctions with one area of subacute infarction in widely disparate vascular territories including territory of left cerebellum and around right caudate nucleus along with widespread lacunar infarcts, chronic microvascular ischemic change, and numerous microbleeds suggesting long standing hypertensive cerebrovascular disease.  MRA confirmed intracranial  . Unsteady gait     . Vertigo    Past Surgical History:  Procedure Laterality Date  . BREAST BIOPSY Right    pt not sure when   . CHOLECYSTECTOMY    . VAGINAL HYSTERECTOMY     Social History   Socioeconomic History  . Marital status: Divorced    Spouse name: Not on file  . Number of children: 5  . Years of education: Not on file  . Highest education level: Not on file  Occupational History  . Occupation: Charity fundraiser  Social Needs  . Financial resource strain: Not on file  . Food insecurity:    Worry: Not on file    Inability: Not on file  . Transportation needs:    Medical: Not on file    Non-medical: Not on file  Tobacco Use  . Smoking status: Never Smoker  . Smokeless tobacco: Never Used  Substance and Sexual Activity  . Alcohol use: No    Alcohol/week: 0.0 oz  . Drug use: No  . Sexual activity: Not on file  Lifestyle  . Physical activity:    Days per week: Not on file    Minutes per session: Not on file  . Stress: Not on file  Relationships  . Social connections:    Talks on phone: Not on file    Gets together:  Not on file    Attends religious service: Not on file    Active member of club or organization: Not on file    Attends meetings of clubs or organizations: Not on file    Relationship status: Not on file  . Intimate partner violence:    Fear of current or ex partner: Not on file    Emotionally abused: Not on file    Physically abused: Not on file    Forced sexual activity: Not on file  Other Topics Concern  . Not on file  Social History Narrative  . Not on file   Family History  Problem Relation Age of Onset  . Heart attack Mother   . Heart attack Brother        death at age 43  . Stroke Brother        death at 61   Current Outpatient Medications on File Prior to Visit  Medication Sig  . alendronate (FOSAMAX) 70 MG tablet TAKE 1 TABLET EVERY 7 DAYS.  SEE PACKAGE FOR ADDITIONAL INSTRUCTIONS  . aspirin 81 MG tablet Take 1 tablet (81 mg total) by mouth daily.  Marland Kitchen  FLUoxetine (PROZAC) 20 MG capsule Take 1 capsule (20 mg total) by mouth daily.  Marland Kitchen lisinopril (PRINIVIL,ZESTRIL) 40 MG tablet Take 1 tablet (40 mg total) by mouth daily.  . meclizine (ANTIVERT) 25 MG tablet Take 1 tablet (25 mg total) by mouth daily as needed for dizziness.  . potassium chloride (K-DUR,KLOR-CON) 10 MEQ tablet Take 1 tablet (10 mEq total) by mouth daily.  . pravastatin (PRAVACHOL) 20 MG tablet Take 1 tablet (20 mg total) by mouth at bedtime.  . cephALEXin (KEFLEX) 500 MG capsule Take 1 capsule (500 mg total) by mouth 3 (three) times daily for 7 days. (Patient not taking: Reported on 12/05/2017)  . esomeprazole (NEXIUM) 20 MG capsule Take 1 capsule (20 mg total) by mouth daily before breakfast. (Patient not taking: Reported on 12/05/2017)   No current facility-administered medications on file prior to visit.     Review of Systems Per HPI unless specifically indicated above     Objective:    BP 110/80 (BP Location: Right Arm, Patient Position: Sitting, Cuff Size: Normal)   Pulse 75   Resp 14   Ht 5\' 2"  (1.575 m)   Wt 156 lb 9.6 oz (71 kg)   BMI 28.64 kg/m   Wt Readings from Last 3 Encounters:  12/05/17 156 lb 9.6 oz (71 kg)  12/04/17 150 lb (68 kg)  03/05/17 149 lb (67.6 kg)    Physical Exam  Constitutional: She is oriented to person, place, and time. She appears well-developed and well-nourished. No distress.  Well-appearing 77 year old female, comfortable, cooperative  HENT:  Head: Normocephalic and atraumatic.  Mouth/Throat: Oropharynx is clear and moist.  Eyes: Conjunctivae are normal. Right eye exhibits no discharge. Left eye exhibits no discharge.  Cardiovascular: Normal rate and intact distal pulses.  Pulmonary/Chest: Effort normal.  Abdominal: Soft. She exhibits no distension. There is no tenderness.  Musculoskeletal: She exhibits no edema (only trace lower extremity edema, non pitting).  Requires assistance to stand and ambulate, does not have cane or walker  present today  Neurological: She is alert and oriented to person, place, and time.  Skin: Skin is warm and dry. No rash noted. She is not diaphoretic. No erythema.  Varicose veins bilateral lower extremity  Psychiatric: She has a normal mood and affect. Her behavior is normal.  Well groomed, good eye  contact. Some reduced speech amount and frequently looks to her daughter to answer questions, but when provides responses she has appropriate speech and normal thoughts. Occasionally difficult word finding or recall otherwise has intact memory  Nursing note and vitals reviewed.  I have personally reviewed the radiology report from CT Head wo contrast 12/04/17  CLINICAL DATA: Altered level of consciousness.  EXAM: CT HEAD WITHOUT CONTRAST  TECHNIQUE: Contiguous axial images were obtained from the base of the skull through the vertex without intravenous contrast.  COMPARISON: CT scan of December 13, 2015.  FINDINGS: Brain: Mild diffuse cortical atrophy is noted. Moderate chronic ischemic white matter disease is noted. No mass effect or midline shift is noted. Ventricular size is within normal limits. There is no evidence of mass lesion, hemorrhage or acute infarction.  Vascular: No hyperdense vessel or unexpected calcification.  Skull: Normal. Negative for fracture or focal lesion.  Sinuses/Orbits: No acute finding.  Other: None.  IMPRESSION: Mild diffuse cortical atrophy. Moderate chronic ischemic white matter disease. No acute intracranial abnormality seen.   Electronically Signed By: Lupita RaiderJames Green Jr, M.D. On: 12/04/2017 15:43   Results for orders placed or performed during the hospital encounter of 12/04/17  Comprehensive metabolic panel  Result Value Ref Range   Sodium 139 135 - 145 mmol/L   Potassium 3.6 3.5 - 5.1 mmol/L   Chloride 105 101 - 111 mmol/L   CO2 25 22 - 32 mmol/L   Glucose, Bld 105 (H) 65 - 99 mg/dL   BUN 16 6 - 20 mg/dL   Creatinine, Ser 1.610.81 0.44 - 1.00  mg/dL   Calcium 8.8 (L) 8.9 - 10.3 mg/dL   Total Protein 6.8 6.5 - 8.1 g/dL   Albumin 3.4 (L) 3.5 - 5.0 g/dL   AST 25 15 - 41 U/L   ALT 16 14 - 54 U/L   Alkaline Phosphatase 41 38 - 126 U/L   Total Bilirubin 0.4 0.3 - 1.2 mg/dL   GFR calc non Af Amer >60 >60 mL/min   GFR calc Af Amer >60 >60 mL/min   Anion gap 9 5 - 15  Troponin I  Result Value Ref Range   Troponin I <0.03 <0.03 ng/mL  CBC with Differential  Result Value Ref Range   WBC 8.6 3.6 - 11.0 K/uL   RBC 4.26 3.80 - 5.20 MIL/uL   Hemoglobin 12.4 12.0 - 16.0 g/dL   HCT 09.637.1 04.535.0 - 40.947.0 %   MCV 87.1 80.0 - 100.0 fL   MCH 29.0 26.0 - 34.0 pg   MCHC 33.3 32.0 - 36.0 g/dL   RDW 81.114.7 (H) 91.411.5 - 78.214.5 %   Platelets 312 150 - 440 K/uL   Neutrophils Relative % 61 %   Neutro Abs 5.3 1.4 - 6.5 K/uL   Lymphocytes Relative 29 %   Lymphs Abs 2.5 1.0 - 3.6 K/uL   Monocytes Relative 7 %   Monocytes Absolute 0.6 0.2 - 0.9 K/uL   Eosinophils Relative 2 %   Eosinophils Absolute 0.2 0 - 0.7 K/uL   Basophils Relative 1 %   Basophils Absolute 0.1 0 - 0.1 K/uL  Troponin I  Result Value Ref Range   Troponin I <0.03 <0.03 ng/mL  TSH  Result Value Ref Range   TSH 1.203 0.350 - 4.500 uIU/mL  Lipase, blood  Result Value Ref Range   Lipase 24 11 - 51 U/L  Urinalysis, Complete w Microscopic  Result Value Ref Range   Color, Urine YELLOW (A) YELLOW  APPearance CLOUDY (A) CLEAR   Specific Gravity, Urine 1.015 1.005 - 1.030   pH 7.0 5.0 - 8.0   Glucose, UA NEGATIVE NEGATIVE mg/dL   Hgb urine dipstick SMALL (A) NEGATIVE   Bilirubin Urine NEGATIVE NEGATIVE   Ketones, ur NEGATIVE NEGATIVE mg/dL   Protein, ur NEGATIVE NEGATIVE mg/dL   Nitrite NEGATIVE NEGATIVE   Leukocytes, UA LARGE (A) NEGATIVE   RBC / HPF 6-10 0 - 5 RBC/hpf   WBC, UA 21-50 0 - 5 WBC/hpf   Bacteria, UA MANY (A) NONE SEEN   Squamous Epithelial / LPF 11-20 0 - 5   Mucus PRESENT    Amorphous Crystal PRESENT       Assessment & Plan:   Problem List Items Addressed  This Visit    Dementia, vascular    Chronic gradual progression of dementia, previously followed by Neurology [facility] with diagnosis most likely supported of Vascular Dementia w/ multiple CVA in past - Additional imaging on file MRI supportive of some Alzheimer's component - Clinically FAST Stage 4-5 (needs help with ADLs), some history of behavioral disturbance now without evidence of harm or danger to self/others. - but now with some wandering behavior - Increased caregiver burden - Meds: None for cognition - on SSRI  Plan: 1. Reviewed Dementia prognosis and management, explained that may need additional support in future. Provided resources and contact info for [employer], given not FAST 6-7 will defer Hospice / Palliatiave - Recommend return to [facility] for further evaluation and monitoring, may need update MRI - Will reach out to [facility] PT to renew services if possible - Consider future referral [facility] to connect with other resources, may need PCS and Care Management if unable to live independently w/ family support      History of cerebrovascular accident (CVA) with residual deficit    Suspected underlying etiology for contributing to vascular dementia No recent CVA. Recent Head CT negative for acute CVA Residual cognitive deficits, some generalized weakness, non focal Improving gradually with PT, has completed OT / SLP Previously followed by [facility] Dr Pearlean Brownie, last MRI 2017 Recommended that she contact them and follow-up again for re-evaluation, given cognitive decline      Hyperlipidemia    Prior lipids controlled Continues on Statin, in setting of prior CVAs See A&P      Relevant Medications   pravastatin (PRAVACHOL) 20 MG tablet   carvedilol (COREG) 12.5 MG tablet   Hypertension - Primary (Chronic)    Currently low normal BP, history and recent ED overnight with SBP >190 - Home BP readings limited but elevated  Complicated by history of CVAs, Vascular  Dementia Failed Amlodipine 5mg  due to edema. Failed HCTZ (side effect, dizzy)    Plan:  1. INCREASE Carvedilol from 6.25mg  BID to 12.5mg  BID - new rx sent - new patient did not want to start new med, agree to stick with current med dose adjust - Continue Lisinopril 40mg  daily - Instructions per AVS when to take next doses 2. Encourage improved lifestyle - low sodium diet, regular exercise stay active 3. Start monitor BP outside office, bring readings to next visit, if persistently >140/90 or new symptoms notify office sooner 4. Follow-up 4-6 weeks - future consider Hydralazine      Relevant Medications   pravastatin (PRAVACHOL) 20 MG tablet   carvedilol (COREG) 12.5 MG tablet   Major depressive disorder, recurrent, unspecified (HCC)    Concern with significantly elevated PHQ9 today, compared to previous values, difficulty  in accurate score due to dementia. - Recent psych eval in ED, patient was cleared for discharge - Clinically her mood is appropriate today in office and does not entirely seem consistent with her high PHQ score - Continues on SSRI Fluoxetine 20mg  daily  Plan Treat underlying acute UTI likely affecting some cognition Follow-up with Neurology to further diagnose dementia Caregiver resources given [facility] Future discuss SSRI adjust and other meds in future once medically stable, new visit today did not adjust dose [place] on avoid Xanax and BDZ in elderly 77 year old patient who has dementia, reviewed safety risks and advised not to take existing rx will not rx       Other Visit Diagnoses    Encounter to establish care with new doctor     Request records / review from prior PCP    Acute cystitis with hematuria       Clinical symptoms present, cognitive change as well, treated in ED Ceftriaxone 1g, now advised to fill existing rx Keflex 500 TID 7 day, no culture obtained ED  Return criteria given if not improved      Meds ordered this encounter    Medications  . carvedilol (COREG) 12.5 MG tablet    Sig: Take 1 tablet (12.5 mg total) by mouth 2 (two) times daily with a meal.    Dispense:  60 tablet    Refill:  2    Follow up plan: Return in about 6 weeks (around 01/16/2018) for Blood Pressure med adjust, PT orders / Dementia.  Saralyn Pilar, DO [facility] 12/05/2017, 10:37 PM

## 2017-12-05 NOTE — ED Notes (Signed)
Pt discharged in care of her daughter.  VS stable. Prescription and discharge instructions reviewed with and given to daughter. Belongings given to patient.

## 2017-12-05 NOTE — Assessment & Plan Note (Addendum)
Prior lipids controlled Continues on Statin, in setting of prior CVAs See A&P

## 2017-12-05 NOTE — ED Notes (Signed)
Pt given clothes to dress for discharge. Vs stable. Maintained on 15 minute checks and observation by security camera for safety.

## 2017-12-05 NOTE — ED Provider Notes (Addendum)
-----------------------------------------   7:45 AM on 12/05/2017 -----------------------------------------   Blood pressure (!) 193/89, pulse 63, temperature 98.6 F (37 C), temperature source Oral, resp. rate 18, height 5\' 2"  (1.575 m), weight 68 kg (150 lb), SpO2 96 %.  The patient had no acute events since last update.  Calm and cooperative at this time.  Disposition is pending CSW eval.    ----------------------------------------- 9:43 AM on 12/05/2017 -----------------------------------------  Patient resting comfortably.  Has been evaluated by clinical social work.  Unfortunate patient would not meet criteria for inpatient rehab will be self-pay and after discussion with daughter she is requesting patient be discharged as she has a new appointment with PCP at 3 today.  Patient will be discharged with prescription for antibiotics.  She is otherwise remains in no acute distress.   Willy Eddyobinson, Annisa Mazzarella, MD 12/05/17 [phone]

## 2017-12-10 ENCOUNTER — Telehealth: Payer: Self-pay

## 2017-12-10 DIAGNOSIS — I10 Essential (primary) hypertension: Secondary | ICD-10-CM

## 2017-12-10 MED ORDER — AMLODIPINE BESYLATE 5 MG PO TABS: 5.0000 mg | ORAL_TABLET | Freq: Every day | ORAL | 2 refills | Status: DC

## 2017-12-10 NOTE — Telephone Encounter (Signed)
Patient's daughter was concerned about her B/P still staying in 171/93-164/93 range after med's adjustment. She had not given any meds for hypertension just incase Dr. Kirtland BouchardK wants to do any change. Please suggest ?

## 2017-12-10 NOTE — Telephone Encounter (Signed)
I called and spoke with the Rosalee KaufmanKim Sawyers from [facility]. She informed me that she placed a new order and will be faxing over that request.

## 2017-12-10 NOTE — Telephone Encounter (Signed)
Last seen 6/5 as new patient, see note for details, recent hospital follow-up. Concern with significant elevated BP inadequately controlled.  Discussed today patient's Bp, did not respond to Coreg inc from 6.25mg  up to 12.5mg  BID, she did best on Amlodipine in past, but had some mild swelling of Left ankle only, without other problems, daughter Micael HampshireLaurie HCPOA asks about possibility of restarting Amlodipine since it was effective. I agree, counseled on med and side effect swelling is not considered harmful from this medicine, will HOLD or DC Coreg 12.5mg  BID since it has not been effective.  MED CHANGES - CONTINUE Lisinopril 40mg  daily - take dose tonight - HOLD Coreg 12.5mg  BID - did not take today, hold for future - determine if needs to add back over next 1-2 weeks or at lower dose 6.25 - RESTART NEW Amlodipine 5mg  daily - new rx [facility] today, she will pick up and give 1st dose this AM, later tonight monitor BP if still >140 will give 2nd dose tonight to keep her at a 24 hour dose in evening  Follow-up as planned, phone update if not improve or concern or question meds  Future may consider alternative option such as Hydralazine.  Saralyn PilarAlexander Caili Escalera, DO [facility] 12/10/2017, 9:03 AM

## 2017-12-11 DIAGNOSIS — M1711 Unilateral primary osteoarthritis, right knee: Secondary | ICD-10-CM | POA: Diagnosis not present

## 2017-12-12 ENCOUNTER — Emergency Department: Payer: Medicare HMO

## 2017-12-12 ENCOUNTER — Telehealth: Payer: Self-pay | Admitting: Nurse Practitioner

## 2017-12-12 ENCOUNTER — Encounter: Payer: Self-pay | Admitting: Emergency Medicine

## 2017-12-12 ENCOUNTER — Emergency Department
Admission: EM | Admit: 2017-12-12 | Discharge: 2017-12-12 | Disposition: A | Discharge status: Home / Self Care | Payer: Medicare HMO | Attending: Emergency Medicine | Admitting: Emergency Medicine

## 2017-12-12 DIAGNOSIS — R109 Unspecified abdominal pain: Secondary | ICD-10-CM | POA: Diagnosis not present

## 2017-12-12 DIAGNOSIS — Z7401 Bed confinement status: Secondary | ICD-10-CM | POA: Diagnosis not present

## 2017-12-12 DIAGNOSIS — R1013 Epigastric pain: Secondary | ICD-10-CM | POA: Diagnosis not present

## 2017-12-12 DIAGNOSIS — R079 Chest pain, unspecified: Secondary | ICD-10-CM | POA: Diagnosis not present

## 2017-12-12 DIAGNOSIS — F419 Anxiety disorder, unspecified: Secondary | ICD-10-CM | POA: Insufficient documentation

## 2017-12-12 DIAGNOSIS — I10 Essential (primary) hypertension: Secondary | ICD-10-CM | POA: Diagnosis not present

## 2017-12-12 DIAGNOSIS — Z7982 Long term (current) use of aspirin: Secondary | ICD-10-CM | POA: Insufficient documentation

## 2017-12-12 DIAGNOSIS — F0390 Unspecified dementia without behavioral disturbance: Secondary | ICD-10-CM | POA: Diagnosis not present

## 2017-12-12 DIAGNOSIS — Z9049 Acquired absence of other specified parts of digestive tract: Secondary | ICD-10-CM | POA: Diagnosis not present

## 2017-12-12 DIAGNOSIS — F329 Major depressive disorder, single episode, unspecified: Secondary | ICD-10-CM | POA: Diagnosis not present

## 2017-12-12 DIAGNOSIS — Z79899 Other long term (current) drug therapy: Secondary | ICD-10-CM | POA: Insufficient documentation

## 2017-12-12 DIAGNOSIS — Z8673 Personal history of transient ischemic attack (TIA), and cerebral infarction without residual deficits: Secondary | ICD-10-CM | POA: Diagnosis not present

## 2017-12-12 LAB — HEPATIC FUNCTION PANEL
ALT: 18 U/L (ref 14–54)
AST: 25 U/L (ref 15–41)
Albumin: 3.8 g/dL (ref 3.5–5.0)
Alkaline Phosphatase: 45 U/L (ref 38–126)
BILIRUBIN TOTAL: 0.5 mg/dL (ref 0.3–1.2)
Total Protein: 7.5 g/dL (ref 6.5–8.1)

## 2017-12-12 LAB — CBC
HEMATOCRIT: 37.1 % (ref 35.0–47.0)
Hemoglobin: 12.5 g/dL (ref 12.0–16.0)
MCH: 29.6 pg (ref 26.0–34.0)
MCHC: 33.6 g/dL (ref 32.0–36.0)
MCV: 87.9 fL (ref 80.0–100.0)
Platelets: 281 10*3/uL (ref 150–440)
RBC: 4.22 MIL/uL (ref 3.80–5.20)
RDW: 14.8 % — AB (ref 11.5–14.5)
WBC: 8.1 10*3/uL (ref 3.6–11.0)

## 2017-12-12 LAB — BASIC METABOLIC PANEL
Anion gap: 12 (ref 5–15)
BUN: 18 mg/dL (ref 6–20)
CALCIUM: 9.1 mg/dL (ref 8.9–10.3)
CO2: 24 mmol/L (ref 22–32)
Chloride: 102 mmol/L (ref 101–111)
Creatinine, Ser: 0.65 mg/dL (ref 0.44–1.00)
GFR calc Af Amer: >60 mL/min (ref >60)
GLUCOSE: 126 mg/dL — AB (ref 65–99)
POTASSIUM: 3.5 mmol/L (ref 3.5–5.1)
Sodium: 138 mmol/L (ref 135–145)

## 2017-12-12 LAB — TROPONIN I: Troponin I: <0.03 ng/mL (ref <0.03)

## 2017-12-12 MED ORDER — GI COCKTAIL ~~LOC~~
30.0000 mL | Freq: Once | ORAL | Status: AC
  Administered 2017-12-12: 30 mL via ORAL
  Filled 2017-12-12: qty 30

## 2017-12-12 NOTE — ED Provider Notes (Signed)
[facility] Emergency Department Provider Note   ____________________________________________   First MD Initiated Contact with Patient 12/12/17 1623     (approximate)  I have reviewed the triage vital signs and the nursing notes.   HISTORY  Chief Complaint Chest Pain    HPI Jill Hancock is a 77 y.o. female patient reports she had burning epigastric pain when she arrived.  This is what she came in for.  This pain has now gone she says.  She does not have any other pain up higher or shortness of breath or any other complaints.  She is not sure how long the pain was there for at least a few hours.   Past Medical History:  Diagnosis Date  . Hyperglycemia   . Hypertension   . Hypokalemia   . Stroke [facility]) 02/2012   MRI revealed at least 3 subcentimeter acute infarctions with one area of subacute infarction in widely disparate vascular territories including territory of left cerebellum and around right caudate nucleus along with widespread lacunar infarcts, chronic microvascular ischemic change, and numerous microbleeds suggesting long standing hypertensive cerebrovascular disease.  MRA confirmed intracranial  . Unsteady gait   . Vertigo     Patient Active Problem List   Diagnosis Date Noted  . Dementia, vascular 12/04/2017  . Right shoulder injury 11/12/2015  . Urinary urgency 09/17/2015  . Hyperglycemia 07/29/2015  . Restless leg 06/07/2015  . Benign paroxysmal positional vertigo 06/07/2015  . Hyperlipidemia 04/16/2015  . Vitamin D deficiency 08/13/2014  . T12 compression fracture (HCC) 04/28/2014  . 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 Major depressive disorder, recurrent, unspecified (HCC) 11/24/2012  . GERD (gastroesophageal reflux disease) 11/24/2012  . Osteoporosis 09/05/2012  . Right hip pain 08/23/2012  . Anxiety 05/04/2012  . Hypertension 02/27/2012  . History of cerebrovascular accident (CVA) with residual deficit 02/25/2012  .  Unsteady gait 02/25/2012    Past Surgical History:  Procedure Laterality Date  . BREAST BIOPSY Right    pt not sure when   . CHOLECYSTECTOMY    . VAGINAL HYSTERECTOMY      Prior to Admission medications   Medication Sig Start Date End Date Taking? Authorizing Provider  alendronate (FOSAMAX) 70 MG tablet TAKE 1 TABLET EVERY 7 DAYS.  SEE PACKAGE FOR ADDITIONAL INSTRUCTIONS 06/12/16   Althia Forts, MD  amLODipine (NORVASC) 5 MG tablet Take 1 tablet (5 mg total) by mouth daily. 12/10/17   Karamalegos, Netta Neat, DO  aspirin 81 MG tablet Take 1 tablet (81 mg total) by mouth daily. 03/07/13   Ronal Fear, NP  cephALEXin (KEFLEX) 500 MG capsule Take 1 capsule (500 mg total) by mouth 3 (three) times daily for 7 days. Patient not taking: Reported on 12/05/2017 12/05/17 12/12/17  Willy Eddy, MD  esomeprazole (NEXIUM) 20 MG capsule Take 1 capsule (20 mg total) by mouth daily before breakfast. Patient not taking: Reported on 12/05/2017 06/12/16   Althia Forts, MD  FLUoxetine (PROZAC) 20 MG capsule Take 1 capsule (20 mg total) by mouth daily. 06/12/16   Althia Forts, MD  lisinopril (PRINIVIL,ZESTRIL) 40 MG tablet Take 1 tablet (40 mg total) by mouth daily. 10/17/16   Eulah Pont, MD  meclizine (ANTIVERT) 25 MG tablet Take 1 tablet (25 mg total) by mouth daily as needed for dizziness. 06/12/16   Althia Forts, MD  potassium chloride (K-DUR,KLOR-CON) 10 MEQ tablet Take 1 tablet (10 mEq total) by mouth daily. 10/17/16   Eulah Pont, MD  pravastatin (PRAVACHOL) 20 MG tablet  Take 1 tablet (20 mg total) by mouth at bedtime. 12/05/17   Smitty Cords, DO    Allergies Demerol [meperidine]; Macrobid [nitrofurantoin macrocrystal]; and Penicillins  Family History  Problem Relation Age of Onset  . Heart attack Mother   . Heart attack Brother        death at age 63  . Stroke Brother        death at 86    Social History Social History   Tobacco Use  . Smoking status: Never Smoker  . Smokeless  tobacco: Never Used  Substance Use Topics  . Alcohol use: No    Alcohol/week: 0.0 oz  . Drug use: No    Review of Systems  Constitutional: No fever/chills Eyes: No visual changes. ENT: No sore throat. Cardiovascular: See HPI Respiratory: Denies shortness of breath. Gastrointestinal: No abdominal pain.  No nausea, no vomiting.  No diarrhea.  No constipation. Genitourinary: Negative for dysuria. Musculoskeletal: Negative for back pain. Skin: Negative for rash. Neurological: Negative for headaches, focal weakness  ____________________________________________   PHYSICAL EXAM:  VITAL SIGNS: ED Triage Vitals  Enc Vitals Group     BP 12/12/17 1503 (!) 163/84     Pulse Rate 12/12/17 1503 74     Resp 12/12/17 1503 16     Temp 12/12/17 1503 98.5 F (36.9 C)     Temp Source 12/12/17 1503 Oral     SpO2 12/12/17 1503 100 %     Weight 12/12/17 1506 156 lb (70.8 kg)     Height 12/12/17 1506 5\' 2"  (1.575 m)     Head Circumference --      Peak Flow --      Pain Score 12/12/17 1505 0     Pain Loc --      Pain Edu? --      Excl. in GC? --     Constitutional: Alert and oriented. Well appearing and in no acute distress. Eyes: Conjunctivae are normal.  Head: Atraumatic. Nose: No congestion/rhinnorhea. Mouth/Throat: Mucous membranes are moist.  Oropharynx non-erythematous. Neck: No stridor.  Cardiovascular: Normal rate, regular rhythm. Grossly normal heart sounds.  Good peripheral circulation. Respiratory: Normal respiratory effort.  No retractions. Lungs CTAB. Gastrointestinal: Soft there is some tenderness in the epigastrium.  This reproduces the patient's pain she says.  The pain is not severe.. No distention. No abdominal bruits. No CVA tenderness. Musculoskeletal: No lower extremity tenderness nor edema.  No joint effusions. Neurologic:  Normal speech and language. No gross focal neurologic deficits are appreciated. No gait instability. Skin:  Skin is warm, dry and intact. No  rash noted. Psychiatric: Mood and affect are normal. Speech and behavior are normal.  ____________________________________________   LABS (all labs ordered are listed, but only abnormal results are displayed)  Labs Reviewed  BASIC METABOLIC PANEL - Abnormal; Notable for the following components:      Result Value   Glucose, Bld 126 (*)    All other components within normal limits  CBC - Abnormal; Notable for the following components:   RDW 14.8 (*)    All other components within normal limits  HEPATIC FUNCTION PANEL - Abnormal; Notable for the following components:   Bilirubin, Direct <0.1 (*)    All other components within normal limits  TROPONIN I  TROPONIN I   ____________________________________________  EKG  EKG read and interpreted by me shows normal sinus rhythm rate of 76 left axis no acute ST-T wave changes.  Looks similar to previous EKGs. ____________________________________________  RADIOLOGY  ED MD interpretation: Chest x-ray read by radiology reviewed by me shows no acute disease  Official radiology report(s): Dg Chest 2 View  Result Date: 12/12/2017 CLINICAL DATA:  Chest pain EXAM: CHEST - 2 VIEW COMPARISON:  12/04/2017 FINDINGS: There is no focal parenchymal opacity. There is no pleural effusion or pneumothorax. The heart and mediastinal contours are unremarkable. There is a moderate size hiatal hernia. There is a chronic T12 vertebral body compression fracture with greater than 90% anterior height loss. IMPRESSION: No active cardiopulmonary disease. Electronically Signed   By: Elige KoHetal  Patel   On: 12/12/2017 16:16    ____________________________________________   PROCEDURES  Procedure(s) performed:   Procedures  Critical Care performed:   ____________________________________________   INITIAL IMPRESSION / ASSESSMENT AND PLAN / ED COURSE Patient's history is most consistent with gastritis or other gastric type of pain.  Troponins are negative EKG  really does not look different.  She is already taking Nexium I will have her double the dose.  I will have her follow-up with her primary care and just in case follow-up with cardiology.          ____________________________________________   FINAL CLINICAL IMPRESSION(S) / ED DIAGNOSES  Final diagnoses:  Epigastric pain     ED Discharge Orders    None       Note:  This document was prepared using Dragon voice recognition software and may include unintentional dictation errors.    Arnaldo NatalMalinda, Breonia Kirstein F, MD 12/12/17 [phone]

## 2017-12-12 NOTE — Telephone Encounter (Signed)
Pt's daughter Lawson FiscalLori called changing pt's contact number to other daughter Zella BallRobin.  She removed herself from pt's emergency contact list and said she was removing herself from the situation and health care power of attorney.  Her call back number is [phone].

## 2017-12-12 NOTE — ED Triage Notes (Signed)
Pt arrived with complaints of upper abdomen/chest pain that felt like burning. Pt states the pain is no longer present.

## 2017-12-12 NOTE — ED Notes (Signed)
Pt in NAD

## 2017-12-12 NOTE — ED Notes (Signed)
Nurse provided pt a snack and drink. Pt denying any pain at this time.

## 2017-12-12 NOTE — Discharge Instructions (Addendum)
Please follow-up with your doctor.  Please increase the Nexium to 1 twice a day.  Please return if you are worse at all.  Just in case please call Dr. Lady GaryFath the cardiologist.  Have him schedule an appointment.  If you call him in the morning they should be to see you tomorrow the next day.

## 2017-12-12 NOTE — ED Notes (Signed)
[facility] called for pt transport.

## 2017-12-14 ENCOUNTER — Encounter: Payer: Self-pay | Admitting: Family Medicine

## 2017-12-14 ENCOUNTER — Ambulatory Visit (INDEPENDENT_AMBULATORY_CARE_PROVIDER_SITE_OTHER): Payer: Medicare HMO | Admitting: Family Medicine

## 2017-12-14 ENCOUNTER — Telehealth: Payer: Self-pay | Admitting: Family Medicine

## 2017-12-14 VITALS — BP 142/83 | HR 90 | Temp 98.4°F | Resp 16 | Ht 62.0 in | Wt 156.0 lb

## 2017-12-14 DIAGNOSIS — M1711 Unilateral primary osteoarthritis, right knee: Secondary | ICD-10-CM

## 2017-12-14 DIAGNOSIS — I10 Essential (primary) hypertension: Secondary | ICD-10-CM | POA: Diagnosis not present

## 2017-12-14 DIAGNOSIS — N390 Urinary tract infection, site not specified: Secondary | ICD-10-CM

## 2017-12-14 DIAGNOSIS — K219 Gastro-esophageal reflux disease without esophagitis: Secondary | ICD-10-CM | POA: Diagnosis not present

## 2017-12-14 LAB — POCT URINALYSIS DIPSTICK
Bilirubin, UA: NEGATIVE
Blood, UA: NEGATIVE
GLUCOSE UA: NEGATIVE
Ketones, UA: NEGATIVE
Nitrite, UA: POSITIVE
Protein, UA: NEGATIVE
Spec Grav, UA: 1.015 (ref 1.010–1.025)
Urobilinogen, UA: 0.2 E.U./dL
pH, UA: 7 (ref 5.0–8.0)

## 2017-12-14 MED ORDER — ESOMEPRAZOLE MAGNESIUM 20 MG PO CPDR: 20.0000 mg | DELAYED_RELEASE_CAPSULE | Freq: Every day | ORAL | 3 refills | Status: DC

## 2017-12-14 NOTE — Assessment & Plan Note (Signed)
Suboptimal control HTN BP mild elevated, home readings still elevated Complicated by history of CVAs, Vascular Dementia Failed Carvedilol 12.5mg  due to ineffective. Failed HCTZ (side effect, dizzy)    Plan:  1. Continue Amlodipine 5mg  daily + Lisinopril 40mg  daily for now - no change today, give meds more time to work, just restart amlodipine few days ago, may be elevated due to knee pain as well - DISCONTINUE Carvedilol 2. Encourage improved lifestyle - low sodium diet, regular exercise stay active 3. Keep monitor BP outside office, bring readings to next visit, if persistently >140/90 or new symptoms notify office sooner 4. Follow-up as scheduled - otherwise call in 1 week if BP needs adjusting, we can increase Amlodipine from 5 to 10 if need - caution edema but it has not returned at this time.

## 2017-12-14 NOTE — Assessment & Plan Note (Signed)
Subacute on chronic R generalized Knee pain without swelling without known injury or trauma  Known knee OA/DJD. Suspected likely due to underlying osteoarthritis / DJD with known OA/DJD in other joints. Prior surgical fix in past Followed by [facility], on injection therapy cortisone and now synvisc type 2 out of 3 - Able to bear weight - Inadequate conservative therapy   Plan: 1. Long discussion on diagnosis of arthritis and treatment options - they are interested in medical therapy - Start anti-inflammatory trial with OTC Naproxen/Aleve 220-250 x 2 pills per dose BID wc x 1-2 weeks, then PRN - reviewed risks with NSAID potential gastritis - may take ASA still due to prior risk of CVA, she may stop if bleeding or other side effect, she was offered carafate to use PRN but declined - Start Tylenol 500-1000mg  per dose TID PRN breakthrough - RICE therapy (rest, ice, compression, elevation) for swelling, activity modification - Defer repeat X-ray, already done by Ortho - F/u last injection Follow up if not improving next options consider topical diclofenac

## 2017-12-14 NOTE — Telephone Encounter (Signed)
Patient advised to schedule an appointment for hospital follow up.

## 2017-12-14 NOTE — Patient Instructions (Addendum)
Thank you for coming to the office today.  For Arthritis as discussed  Use RICE therapy: - R - Rest / relative rest with activity modification avoid overuse of joint - I - Ice packs (make sure you use a towel or sock / something to protect skin) - C - Compression with flexible Knee Sleeve wrap to apply pressure and reduce swelling allowing more support - E - Elevation - if significant swelling, lift leg above heart level (toes above your nose) to help reduce swelling, most helpful at night after day of being on your feet  Recommend to start taking Tylenol Extra Strength 500mg  tabs - take 1 to 2 tabs per dose (max 1000mg ) every 6-8 hours for pain (take regularly, don't skip a dose for next 7 days), max 24 hour daily dose is 6 tablets or 3000mg . In the future you can repeat the same everyday Tylenol course for 1-2 weeks at a time.  - This is safe to take with anti-inflammatory medicines   Recommend trial of AS NEEDED - Anti-inflammatory with Naproxen sodium OR Aleve OTC - 220 or 250 - take TWO OTC with food and plenty of water TWICE daily every day (breakfast and dinner), for next 1 to 2 weeks, then you may take only as needed - DO NOT TAKE any ibuprofen advil, motrin while you are taking this medicine  Future can try to rx Diclofenac topical (Voltaren gel) rx  ---- For BP - Remain off Carvedilol - Continue Amlodipine 5mg  daily and Lisinopril 40mg  daily - then after 1 week of close monitoring BP - call office and we can change the Amlodipine to 10mg  in future if needed - let me know  For urine We check urine culture today - we will call you if need repeat antibiotic, otherwise you are good  Please schedule a Follow-up Appointment to: Return if symptoms worsen or fail to improve, for keep apt f/u.  If you have any other questions or concerns, please feel free to call the office or send a message through MyChart. You may also schedule an earlier appointment if necessary.  Additionally, you  may be receiving a survey about your experience at our office within a few days to 1 week by e-mail or mail. We value your feedback.  Saralyn PilarAlexander Ahmira Boisselle, DO [facility]

## 2017-12-14 NOTE — Progress Notes (Signed)
Subjective:    Patient ID: Jill Hancock, female    DOB: Mar 22, 1941, 77 y.o.   MRN: [id number]  Jill Hancock is a 77 y.o. female presenting on 12/14/2017 for Hospitalization Follow-up ( epigastric pain, UTI)  History primarily provided by daughter, [name], also accompanied is granddaughter. Patient only provides limited history with dementia.  HPI   FOLLOW-UP UTI Last visit 12/05/17, after ED visit 6/4 she was treated for UTI in ED but no culture drawn, she had CTX 1g and then Keflex 500 TID 7 days. She has finished this and her UTI symptoms have resolved. - Now she came to office today requesting urinalysis re-evaluation to confirm it is gone, she was asked to schedule office visit - She denies any fevers, chills dysuria, urinary frequency, hematuria, abdominal pain, nausea vomiting, flank or back pain  CHRONIC HTN: Reports since last visit she was increased on Carvedilol from 6.25mg  BID up to 12.5mg  BID and did not respond well, her daughter / patient requested to switch back to Amlodipine previous med that worked before, we agreed to [employer] Carvedilol 12.5mg  and restart new rx Amlodipine 5mg  daily along with Lisinopril 40mg  - Home readings 140-160/90s, occasional 80 on DBP, not well controlled Current Meds - Lisinopril 40mg  daily, Amlodipine 5mg  daily   Reports good compliance, took meds today. Tolerating well, w/o complaints. Denies CP, dyspnea, HA, edema, dizziness / lightheadedness  Chronic R Knee Pain / Osteoarthritis R knee, s/p prior surgery Followed by [facility] for OA/DJD multiple joints, primarily R knee is main problem, has received multiple cortisone injections and also synvisc type injection x 2 out of 3 now, waiting on last dose, was told may take few weeks to take full effect. They asked her to come here to discuss medication options for arthritis - No recent imaging on file. Or chart from ortho in scanned chart. - She has R knee pain every day, worse with excess  activity - She does not take Tylenol regularly. She takes Aleve OTC 1 pill as needed - Tried ice packs, heating pad, knee sleeve - Asking about friend takes Diclofenac sodium with good results - Denies knee swelling redness other joint pain fall injury or trauma  Additionally - Hospital follow-up for ED visit 12/12/17, she went for epigastric pain, testing work up was negative for cardiac etiology, she was diagnosed with gastritis or GERD related symptoms. She needs new rx Nexium 20mg  daily, has been off of this, they referred her back to Cardiology has not scheduled yet.   Depression screen [facility] 2/9 12/05/2017 07/18/2016 07/05/2016  Decreased Interest 3 0 0  Down, Depressed, Hopeless 3 1 1   PHQ - 2 Score 6 1 1   Altered sleeping 3 - -  Tired, decreased energy 2 - -  Change in appetite 2 - -  Feeling bad or failure about yourself  3 - -  Trouble concentrating 3 - -  Moving slowly or fidgety/restless 2 - -  Suicidal thoughts 0 - -  PHQ-9 Score 21 - -  Difficult doing work/chores Very difficult - -  Some recent data might be hidden    Social History   Tobacco Use  . Smoking status: Never Smoker  . Smokeless tobacco: Never Used  Substance Use Topics  . Alcohol use: No    Alcohol/week: 0.0 oz  . Drug use: No    Review of Systems Per HPI unless specifically indicated above     Objective:    BP (!) 142/83  Pulse 90   Temp 98.4 F (36.9 C) (Oral)   Resp 16   Ht 5\' 2"  (1.575 m)   Wt 156 lb (70.8 kg)   BMI 28.53 kg/m   Wt Readings from Last 3 Encounters:  12/14/17 156 lb (70.8 kg)  12/12/17 156 lb (70.8 kg)  12/05/17 156 lb 9.6 oz (71 kg)    Physical Exam  Constitutional: She is oriented to person, place, and time. She appears well-developed and well-nourished. No distress.  Well-appearing 77 year old female, comfortable, cooperative  HENT:  Head: Normocephalic and atraumatic.  Mouth/Throat: Oropharynx is clear and moist.  Eyes: Conjunctivae are normal. Right eye  exhibits no discharge. Left eye exhibits no discharge.  Cardiovascular: Normal rate and intact distal pulses.  Pulmonary/Chest: Effort normal.  Abdominal: Soft. She exhibits no distension. There is no tenderness.  Musculoskeletal: She exhibits no edema (only trace lower extremity edema, non pitting).  Requires assistance to stand and ambulate, does not have cane or walker present today  Right Knee Inspection: Mild bulky appearance. No ecchymosis or effusion. Palpation: Mild tender posterior knee and some medial. Notable crepitus on exam. ROM: Slightly limited active ROM bilaterally due to pain R Strength: 5/5 intact knee flex/ext, ankle dorsi/plantarflex Neurovascular: distally intact sensation light touch and pulses   Neurological: She is alert and oriented to person, place, and time.  Skin: Skin is warm and dry. No rash noted. She is not diaphoretic. No erythema.  Varicose veins bilateral lower extremity  Psychiatric: She has a normal mood and affect. Her behavior is normal.  Well groomed, good eye contact. Some reduced speech amount and frequently looks to her daughter to answer questions, but when provides responses she has appropriate speech and normal thoughts. Occasionally difficult word finding or recall otherwise has intact memory  Nursing note and vitals reviewed.  Results for orders placed or performed in visit on 12/14/17  POCT Urinalysis Dipstick  Result Value Ref Range   Color, UA amber    Clarity, UA clear    Glucose, UA Negative Negative   Bilirubin, UA Negative    Ketones, UA Negative    Spec Grav, UA 1.015 1.010 - 1.025   Blood, UA Negative    pH, UA 7.0 5.0 - 8.0   Protein, UA Negative Negative   Urobilinogen, UA 0.2 0.2 or 1.0 E.U./dL   Nitrite, UA positive    Leukocytes, UA Trace (A) Negative   Appearance clear    Odor none       Assessment & Plan:   Problem List Items Addressed This Visit    GERD (gastroesophageal reflux disease)    Previously  controlled on PPI Recent ED visit with recurrence or flare up Restart PPI Nexium 20mg  daily rx sent, as this worked in past Consider add carafate since starting regular NSAID for first 1-2 weeks for acute knee pain      Relevant Medications   esomeprazole (NEXIUM) 20 MG capsule   Hypertension (Chronic)    Suboptimal control HTN BP mild elevated, home readings still elevated Complicated by history of CVAs, Vascular Dementia Failed Carvedilol 12.5mg  due to ineffective. Failed HCTZ (side effect, dizzy)    Plan:  1. Continue Amlodipine 5mg  daily + Lisinopril 40mg  daily for now - no change today, give meds more time to work, just restart amlodipine few days ago, may be elevated due to knee pain as well - DISCONTINUE Carvedilol 2. Encourage improved lifestyle - low sodium diet, regular exercise stay active 3. Keep monitor BP  outside office, bring readings to next visit, if persistently >140/90 or new symptoms notify office sooner 4. Follow-up as scheduled - otherwise call in 1 week if BP needs adjusting, we can increase Amlodipine from 5 to 10 if need - caution edema but it has not returned at this time.      Relevant Medications   carvedilol (COREG) 6.25 MG tablet   Primary osteoarthritis of right knee - Primary    Subacute on chronic R generalized Knee pain without swelling without known injury or trauma  Known knee OA/DJD. Suspected likely due to underlying osteoarthritis / DJD with known OA/DJD in other joints. Prior surgical fix in past Followed by [facility], on injection therapy cortisone and now synvisc type 2 out of 3 - Able to bear weight - Inadequate conservative therapy   Plan: 1. Long discussion on diagnosis of arthritis and treatment options - they are interested in medical therapy - Start anti-inflammatory trial with OTC Naproxen/Aleve 220-250 x 2 pills per dose BID wc x 1-2 weeks, then PRN - reviewed risks with NSAID potential gastritis - may take ASA still due to prior  risk of CVA, she may stop if bleeding or other side effect, she was offered carafate to use PRN but declined - Start Tylenol 500-1000mg  per dose TID PRN breakthrough - RICE therapy (rest, ice, compression, elevation) for swelling, activity modification - Defer repeat X-ray, already done by Ortho - F/u last injection Follow up if not improving next options consider topical diclofenac      Relevant Medications   naproxen sodium (ALEVE) 220 MG tablet   acetaminophen (TYLENOL) 500 MG tablet    Other Visit Diagnoses    Urinary tract infection without hematuria, site unspecified       Asymptomatic now was resolved, will re-check UA and Culture to confirm resolution by request, no antiobitics now   Relevant Orders   POCT Urinalysis Dipstick (Completed)   Urine Culture      Meds ordered this encounter  Medications  . esomeprazole (NEXIUM) 20 MG capsule    Sig: Take 1 capsule (20 mg total) by mouth daily before breakfast.    Dispense:  90 capsule    Refill:  3    Follow up plan: Return if symptoms worsen or fail to improve, for keep apt f/u.  Saralyn PilarAlexander Alexandar Weisenberger, DO [facility] 12/14/2017, 1:20 PM

## 2017-12-14 NOTE — Assessment & Plan Note (Signed)
Previously controlled on PPI Recent ED visit with recurrence or flare up Restart PPI Nexium 20mg  daily rx sent, as this worked in past Consider add carafate since starting regular NSAID for first 1-2 weeks for acute knee pain

## 2017-12-14 NOTE — Telephone Encounter (Signed)
Pt was recently in the hospital and asked to have a urine sample to check to see if UTI was cleared up after taking antibiotics [phone]

## 2017-12-16 LAB — URINE CULTURE
MICRO NUMBER: 90717897
SPECIMEN QUALITY: ADEQUATE

## 2017-12-17 ENCOUNTER — Telehealth: Payer: Self-pay | Admitting: Family Medicine

## 2017-12-17 DIAGNOSIS — I10 Essential (primary) hypertension: Secondary | ICD-10-CM

## 2017-12-17 DIAGNOSIS — I69393 Ataxia following cerebral infarction: Secondary | ICD-10-CM | POA: Diagnosis not present

## 2017-12-17 DIAGNOSIS — Z9181 History of falling: Secondary | ICD-10-CM | POA: Diagnosis not present

## 2017-12-17 DIAGNOSIS — I69322 Dysarthria following cerebral infarction: Secondary | ICD-10-CM | POA: Diagnosis not present

## 2017-12-17 DIAGNOSIS — R41841 Cognitive communication deficit: Secondary | ICD-10-CM | POA: Diagnosis not present

## 2017-12-17 DIAGNOSIS — Z7982 Long term (current) use of aspirin: Secondary | ICD-10-CM | POA: Diagnosis not present

## 2017-12-17 NOTE — Telephone Encounter (Signed)
Last seen in office on 12/14/17. Now 3 days later she has called back with concerns after we sent her urine culture results.  Here is response from [place] ArtistJamie Holden CMA who spoke with patient's daughter today     Daughter states her BP is running 145-150/85-90 Should she start higher dose of amlodipine. I did advise to watch for fever and chills or any symptoms of worsening UTI and after two rounds of abx daughter agrees to wait this out and see if needs one or not with such small bacteria. Send response to Jill Hancock to call tomorrow pls.    ----------------------------------- I called patient/daughter back, spoke with Jill FiscalLori to clarify her concerns.  1. Urine Culture - showed bacteria pseudomonas, but she was asymptomatic and her UTI symptoms had resolved on prior treatment. I advised that we should not re-treat her unless she develops symptoms again of UTI. She should return to clinic for repeat urine testing and antibiotics if develops worsening urinary symptoms - burning, abdominal pain, nausea, blood in urine, fever chills. - She agrees, defer antibiotics for now unless clinical worsening, already had x 2 antibiotics in past month  2. For BP - it may still be too soon to tell. But given their level of concern with persistent elevated BP I agreed to increase Amlodipine from 5mg  to 10mg  - double current 5mg  tabs nightly and check BP at home, notify office within 1 week with results, if improved control and no significant swelling we can send new rx 10mg  daily, she may get leg swelling will need to elevate legs to control this.  Follow-up as planned  Jill PilarAlexander Karamalegos, DO [facility] 12/17/2017, 6:00 PM

## 2017-12-18 DIAGNOSIS — M1711 Unilateral primary osteoarthritis, right knee: Secondary | ICD-10-CM | POA: Diagnosis not present

## 2017-12-21 ENCOUNTER — Telehealth: Payer: Self-pay | Admitting: Family Medicine

## 2017-12-21 NOTE — Telephone Encounter (Signed)
Boneta [facility]Jenny with [facility] said per daughter they omitted 2nd visit this week and will add it next week.  They will also discharge pt next week.  Her call back number is [phone]

## 2017-12-24 ENCOUNTER — Telehealth: Payer: Self-pay | Admitting: Family Medicine

## 2017-12-24 DIAGNOSIS — Z9181 History of falling: Secondary | ICD-10-CM | POA: Diagnosis not present

## 2017-12-24 DIAGNOSIS — I10 Essential (primary) hypertension: Secondary | ICD-10-CM | POA: Diagnosis not present

## 2017-12-24 DIAGNOSIS — I69322 Dysarthria following cerebral infarction: Secondary | ICD-10-CM | POA: Diagnosis not present

## 2017-12-24 DIAGNOSIS — R41841 Cognitive communication deficit: Secondary | ICD-10-CM | POA: Diagnosis not present

## 2017-12-24 DIAGNOSIS — I69393 Ataxia following cerebral infarction: Secondary | ICD-10-CM | POA: Diagnosis not present

## 2017-12-24 DIAGNOSIS — Z7982 Long term (current) use of aspirin: Secondary | ICD-10-CM | POA: Diagnosis not present

## 2017-12-24 MED ORDER — AMLODIPINE BESYLATE 10 MG PO TABS: 10.0000 mg | ORAL_TABLET | Freq: Every day | ORAL | 1 refills | Status: DC

## 2017-12-24 NOTE — Telephone Encounter (Signed)
Agrees with plan. Needs refill on Amlodipine 10 mg as [facility] ran out today [facility]

## 2017-12-24 NOTE — Telephone Encounter (Signed)
This is what we expected. She has had swelling from Amlodipine before. But as long as it controls the BP the swelling should not cause her harm and I would continue current dosing regimen.  Follow-up as planned.  Saralyn PilarAlexander Karamalegos, DO [facility] 12/24/2017, 1:14 PM

## 2017-12-24 NOTE — Telephone Encounter (Signed)
Pt.  daughter called states that pt ankle was swellen when you up her medication  To 10 mg but it was making  BP go down her Lawson FiscalLori call back # [phone]

## 2017-12-24 NOTE — Telephone Encounter (Signed)
Sable Feilalled [name], sent rx refill Amlodipine 10mg  daily #[facility], [facility] if [facility] well  Saralyn PilarAlexander Karamalegos, DO [facility] 12/24/2017, 5:59 PM

## 2017-12-24 NOTE — Addendum Note (Signed)
Addended by: Smitty CordsKARAMALEGOS, Shion Bluestein J on: 12/24/2017 05:59 PM   Modules accepted: Orders

## 2017-12-26 DIAGNOSIS — I69393 Ataxia following cerebral infarction: Secondary | ICD-10-CM | POA: Diagnosis not present

## 2017-12-26 DIAGNOSIS — I69322 Dysarthria following cerebral infarction: Secondary | ICD-10-CM | POA: Diagnosis not present

## 2017-12-26 DIAGNOSIS — R41841 Cognitive communication deficit: Secondary | ICD-10-CM | POA: Diagnosis not present

## 2017-12-26 DIAGNOSIS — Z7982 Long term (current) use of aspirin: Secondary | ICD-10-CM | POA: Diagnosis not present

## 2017-12-26 DIAGNOSIS — Z9181 History of falling: Secondary | ICD-10-CM | POA: Diagnosis not present

## 2017-12-26 DIAGNOSIS — I10 Essential (primary) hypertension: Secondary | ICD-10-CM | POA: Diagnosis not present

## 2017-12-31 ENCOUNTER — Encounter: Payer: Self-pay | Admitting: Emergency Medicine

## 2017-12-31 ENCOUNTER — Other Ambulatory Visit: Payer: Self-pay

## 2017-12-31 ENCOUNTER — Observation Stay
Admission: EM | Admit: 2017-12-31 | Discharge: 2018-01-02 | Disposition: A | Discharge status: Skilled Nursing Facility | Payer: Medicare HMO | Attending: Internal Medicine | Admitting: Internal Medicine

## 2017-12-31 ENCOUNTER — Emergency Department: Payer: Medicare HMO

## 2017-12-31 DIAGNOSIS — Z79899 Other long term (current) drug therapy: Secondary | ICD-10-CM | POA: Diagnosis not present

## 2017-12-31 DIAGNOSIS — I10 Essential (primary) hypertension: Secondary | ICD-10-CM | POA: Diagnosis not present

## 2017-12-31 DIAGNOSIS — R42 Dizziness and giddiness: Secondary | ICD-10-CM | POA: Diagnosis not present

## 2017-12-31 DIAGNOSIS — X58XXXA Exposure to other specified factors, initial encounter: Secondary | ICD-10-CM | POA: Insufficient documentation

## 2017-12-31 DIAGNOSIS — Z885 Allergy status to narcotic agent status: Secondary | ICD-10-CM | POA: Insufficient documentation

## 2017-12-31 DIAGNOSIS — Z90710 Acquired absence of both cervix and uterus: Secondary | ICD-10-CM | POA: Insufficient documentation

## 2017-12-31 DIAGNOSIS — Z66 Do not resuscitate: Secondary | ICD-10-CM | POA: Insufficient documentation

## 2017-12-31 DIAGNOSIS — Z8673 Personal history of transient ischemic attack (TIA), and cerebral infarction without residual deficits: Secondary | ICD-10-CM | POA: Diagnosis not present

## 2017-12-31 DIAGNOSIS — I700 Atherosclerosis of aorta: Secondary | ICD-10-CM | POA: Insufficient documentation

## 2017-12-31 DIAGNOSIS — Z8249 Family history of ischemic heart disease and other diseases of the circulatory system: Secondary | ICD-10-CM | POA: Insufficient documentation

## 2017-12-31 DIAGNOSIS — R079 Chest pain, unspecified: Secondary | ICD-10-CM | POA: Diagnosis not present

## 2017-12-31 DIAGNOSIS — S2243XA Multiple fractures of ribs, bilateral, initial encounter for closed fracture: Secondary | ICD-10-CM | POA: Insufficient documentation

## 2017-12-31 DIAGNOSIS — Z881 Allergy status to other antibiotic agents status: Secondary | ICD-10-CM | POA: Insufficient documentation

## 2017-12-31 DIAGNOSIS — Z9049 Acquired absence of other specified parts of digestive tract: Secondary | ICD-10-CM | POA: Insufficient documentation

## 2017-12-31 DIAGNOSIS — S2239XA Fracture of one rib, unspecified side, initial encounter for closed fracture: Secondary | ICD-10-CM | POA: Diagnosis not present

## 2017-12-31 DIAGNOSIS — E876 Hypokalemia: Secondary | ICD-10-CM | POA: Insufficient documentation

## 2017-12-31 DIAGNOSIS — Z9889 Other specified postprocedural states: Secondary | ICD-10-CM | POA: Insufficient documentation

## 2017-12-31 DIAGNOSIS — S2249XA Multiple fractures of ribs, unspecified side, initial encounter for closed fracture: Secondary | ICD-10-CM | POA: Diagnosis present

## 2017-12-31 DIAGNOSIS — J9811 Atelectasis: Secondary | ICD-10-CM | POA: Diagnosis not present

## 2017-12-31 DIAGNOSIS — R2681 Unsteadiness on feet: Secondary | ICD-10-CM | POA: Diagnosis not present

## 2017-12-31 DIAGNOSIS — Z823 Family history of stroke: Secondary | ICD-10-CM | POA: Diagnosis not present

## 2017-12-31 DIAGNOSIS — R269 Unspecified abnormalities of gait and mobility: Secondary | ICD-10-CM | POA: Diagnosis not present

## 2017-12-31 DIAGNOSIS — Z880 Allergy status to penicillin: Secondary | ICD-10-CM | POA: Diagnosis not present

## 2017-12-31 DIAGNOSIS — S2220XA Unspecified fracture of sternum, initial encounter for closed fracture: Secondary | ICD-10-CM | POA: Diagnosis not present

## 2017-12-31 DIAGNOSIS — K449 Diaphragmatic hernia without obstruction or gangrene: Secondary | ICD-10-CM | POA: Diagnosis not present

## 2017-12-31 DIAGNOSIS — R911 Solitary pulmonary nodule: Secondary | ICD-10-CM | POA: Insufficient documentation

## 2017-12-31 DIAGNOSIS — S2222XA Fracture of body of sternum, initial encounter for closed fracture: Principal | ICD-10-CM | POA: Insufficient documentation

## 2017-12-31 DIAGNOSIS — Z7982 Long term (current) use of aspirin: Secondary | ICD-10-CM | POA: Diagnosis not present

## 2017-12-31 LAB — BASIC METABOLIC PANEL
ANION GAP: 13 (ref 5–15)
BUN: 14 mg/dL (ref 8–23)
CALCIUM: 8.8 mg/dL — AB (ref 8.9–10.3)
CO2: 23 mmol/L (ref 22–32)
Chloride: 101 mmol/L (ref 98–111)
Creatinine, Ser: 0.86 mg/dL (ref 0.44–1.00)
GFR calc Af Amer: >60 mL/min (ref >60)
GLUCOSE: 136 mg/dL — AB (ref 70–99)
Potassium: 3.3 mmol/L — ABNORMAL LOW (ref 3.5–5.1)
SODIUM: 137 mmol/L (ref 135–145)

## 2017-12-31 LAB — TROPONIN I

## 2017-12-31 LAB — CBC
HCT: 37.7 % (ref 35.0–47.0)
Hemoglobin: 12.8 g/dL (ref 12.0–16.0)
MCH: 29.9 pg (ref 26.0–34.0)
MCHC: 34 g/dL (ref 32.0–36.0)
MCV: 87.9 fL (ref 80.0–100.0)
Platelets: 367 10*3/uL (ref 150–440)
RBC: 4.29 MIL/uL (ref 3.80–5.20)
RDW: 14.4 % (ref 11.5–14.5)
WBC: 10.2 10*3/uL (ref 3.6–11.0)

## 2017-12-31 MED ORDER — ALENDRONATE SODIUM 70 MG PO TABS: 70.0000 mg | ORAL_TABLET | ORAL | Status: DC

## 2017-12-31 MED ORDER — ACETAMINOPHEN 325 MG PO TABS
650.0000 mg | ORAL_TABLET | Freq: Four times a day (QID) | ORAL | Status: DC | PRN
  Administered 2018-01-01 – 2018-01-02 (×3): 650 mg via ORAL
  Filled 2017-12-31 (×3): qty 2

## 2017-12-31 MED ORDER — LISINOPRIL 20 MG PO TABS
40.0000 mg | ORAL_TABLET | Freq: Every day | ORAL | Status: DC
  Administered 2017-12-31 – 2018-01-02 (×3): 40 mg via ORAL
  Filled 2017-12-31 (×3): qty 2

## 2017-12-31 MED ORDER — FLUTICASONE PROPIONATE 50 MCG/ACT NA SUSP
1.0000 | Freq: Every day | NASAL | Status: DC
  Administered 2018-01-01 – 2018-01-02 (×2): 1 via NASAL
  Filled 2017-12-31: qty 16

## 2017-12-31 MED ORDER — ENOXAPARIN SODIUM 40 MG/0.4ML ~~LOC~~ SOLN
40.0000 mg | SUBCUTANEOUS | Status: DC
  Administered 2017-12-31 – 2018-01-01 (×2): 40 mg via SUBCUTANEOUS
  Filled 2017-12-31 (×2): qty 0.4

## 2017-12-31 MED ORDER — TRAMADOL HCL 50 MG PO TABS
50.0000 mg | ORAL_TABLET | Freq: Four times a day (QID) | ORAL | Status: DC | PRN
  Administered 2017-12-31 – 2018-01-02 (×3): 50 mg via ORAL
  Filled 2017-12-31 (×3): qty 1

## 2017-12-31 MED ORDER — SENNOSIDES-DOCUSATE SODIUM 8.6-50 MG PO TABS: 1.0000 | ORAL_TABLET | Freq: Every evening | ORAL | Status: DC | PRN

## 2017-12-31 MED ORDER — ACETAMINOPHEN 650 MG RE SUPP: 650.0000 mg | Freq: Four times a day (QID) | RECTAL | Status: DC | PRN

## 2017-12-31 MED ORDER — SODIUM CHLORIDE 0.9 % IV SOLN: 250.0000 mL | INTRAVENOUS | Status: DC | PRN

## 2017-12-31 MED ORDER — ONDANSETRON HCL 4 MG PO TABS
4.0000 mg | ORAL_TABLET | Freq: Four times a day (QID) | ORAL | Status: DC | PRN
  Administered 2017-12-31: 4 mg via ORAL
  Filled 2017-12-31: qty 1

## 2017-12-31 MED ORDER — POTASSIUM CHLORIDE CRYS ER 10 MEQ PO TBCR
10.0000 meq | EXTENDED_RELEASE_TABLET | Freq: Every day | ORAL | Status: DC
  Administered 2018-01-01 – 2018-01-02 (×2): 10 meq via ORAL
  Filled 2017-12-31 (×2): qty 1

## 2017-12-31 MED ORDER — ONDANSETRON HCL 4 MG/2ML IJ SOLN
4.0000 mg | Freq: Four times a day (QID) | INTRAMUSCULAR | Status: DC | PRN
Start: 2017-12-31 — End: 2018-01-02
  Administered 2018-01-01: 4 mg via INTRAVENOUS
  Filled 2017-12-31: qty 2

## 2017-12-31 MED ORDER — SODIUM CHLORIDE 0.9% FLUSH: 3.0000 mL | INTRAVENOUS | Status: DC | PRN

## 2017-12-31 MED ORDER — AMLODIPINE BESYLATE 10 MG PO TABS
10.0000 mg | ORAL_TABLET | Freq: Every day | ORAL | Status: DC
  Administered 2017-12-31 – 2018-01-02 (×3): 10 mg via ORAL
  Filled 2017-12-31 (×3): qty 1

## 2017-12-31 MED ORDER — MORPHINE SULFATE (PF) 2 MG/ML IV SOLN: 2.0000 mg | INTRAVENOUS | Status: DC | PRN

## 2017-12-31 MED ORDER — MORPHINE SULFATE (PF) 4 MG/ML IV SOLN
4.0000 mg | Freq: Once | INTRAVENOUS | Status: DC
  Filled 2017-12-31: qty 1

## 2017-12-31 MED ORDER — FLUOXETINE HCL 20 MG PO CAPS
20.0000 mg | ORAL_CAPSULE | Freq: Every day | ORAL | Status: DC
  Administered 2018-01-01 – 2018-01-02 (×2): 20 mg via ORAL
  Filled 2017-12-31 (×3): qty 1

## 2017-12-31 MED ORDER — IOHEXOL 300 MG/ML  SOLN
75.0000 mL | Freq: Once | INTRAMUSCULAR | Status: AC | PRN
  Administered 2017-12-31: 75 mL via INTRAVENOUS

## 2017-12-31 MED ORDER — ONDANSETRON HCL 4 MG/2ML IJ SOLN
4.0000 mg | Freq: Once | INTRAMUSCULAR | Status: AC
  Administered 2017-12-31: 4 mg via INTRAVENOUS
  Filled 2017-12-31: qty 2

## 2017-12-31 MED ORDER — ALPRAZOLAM 0.25 MG PO TABS
0.2500 mg | ORAL_TABLET | Freq: Three times a day (TID) | ORAL | Status: DC | PRN
  Administered 2018-01-01: 0.25 mg via ORAL
  Filled 2017-12-31: qty 1

## 2017-12-31 MED ORDER — MECLIZINE HCL 25 MG PO TABS
25.0000 mg | ORAL_TABLET | Freq: Every day | ORAL | Status: DC | PRN
  Filled 2017-12-31: qty 1

## 2017-12-31 MED ORDER — ASPIRIN EC 81 MG PO TBEC
81.0000 mg | DELAYED_RELEASE_TABLET | Freq: Every day | ORAL | Status: DC
  Administered 2018-01-01 – 2018-01-02 (×2): 81 mg via ORAL
  Filled 2017-12-31 (×2): qty 1

## 2017-12-31 MED ORDER — PRAVASTATIN SODIUM 20 MG PO TABS
20.0000 mg | ORAL_TABLET | Freq: Every day | ORAL | Status: DC
  Administered 2017-12-31 – 2018-01-01 (×2): 20 mg via ORAL
  Filled 2017-12-31 (×2): qty 1

## 2017-12-31 MED ORDER — PANTOPRAZOLE SODIUM 40 MG PO TBEC
40.0000 mg | DELAYED_RELEASE_TABLET | Freq: Every day | ORAL | Status: DC
  Administered 2018-01-01 – 2018-01-02 (×2): 40 mg via ORAL
  Filled 2017-12-31 (×2): qty 1

## 2017-12-31 MED ORDER — SODIUM CHLORIDE 0.9% FLUSH
3.0000 mL | Freq: Two times a day (BID) | INTRAVENOUS | Status: DC
  Administered 2017-12-31 – 2018-01-02 (×4): 3 mL via INTRAVENOUS

## 2017-12-31 MED ORDER — MORPHINE SULFATE (PF) 2 MG/ML IV SOLN
2.0000 mg | Freq: Once | INTRAVENOUS | Status: AC
Start: 2017-12-31 — End: 2017-12-31
  Administered 2017-12-31: 2 mg via INTRAVENOUS
  Filled 2017-12-31: qty 1

## 2017-12-31 NOTE — ED Triage Notes (Signed)
[facility]. States she started having chest pain and nausea this morning. Denies vomiting or SOB. Denies history of same. Reports she was moving heavy furniture yesterday and believes this may be related. Pain worse with certain movements and coughing. Alert and oriented x4.

## 2017-12-31 NOTE — ED Provider Notes (Signed)
[facility] Emergency Department Provider Note   ____________________________________________    I have reviewed the triage vital signs and the nursing notes.   HISTORY  Chief Complaint Chest Pain  History of dementia, history limited   HPI Jill Hancock is a 77 y.o. female who apparently lives alone who presents with complaints of chest pain.  Patient states she may have had a fall last week.  She complains of pain in her central chest, left chest primarily.  She reports it is worse with movement.  Has not taken anything for this.  Denies shortness of breath.  No fevers or chills or cough.  No diaphoresis reported  Past Medical History:  Diagnosis Date  . Hyperglycemia   . Hypertension   . Hypokalemia   . Stroke [facility]) 02/2012   MRI revealed at least 3 subcentimeter acute infarctions with one area of subacute infarction in widely disparate vascular territories including territory of left cerebellum and around right caudate nucleus along with widespread lacunar infarcts, chronic microvascular ischemic change, and numerous microbleeds suggesting long standing hypertensive cerebrovascular disease.  MRA confirmed intracranial  . Unsteady gait   . Vertigo     Patient Active Problem List   Diagnosis Date Noted  . Multiple rib fractures 12/31/2017  . Primary osteoarthritis of right knee 12/14/2017  . Dementia, vascular 12/04/2017  . Right shoulder injury 11/12/2015  . Urinary urgency 09/17/2015  . Hyperglycemia 07/29/2015  . Restless leg 06/07/2015  . Benign paroxysmal positional vertigo 06/07/2015  . Hyperlipidemia 04/16/2015  . Vitamin D deficiency 08/13/2014  . T12 compression fracture (HCC) 04/28/2014  . 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 Major depressive disorder, recurrent, unspecified (HCC) 11/24/2012  . GERD (gastroesophageal reflux disease) 11/24/2012  . Osteoporosis 09/05/2012  . Right hip pain 08/23/2012  . Anxiety 05/04/2012  .  Hypertension 02/27/2012  . History of cerebrovascular accident (CVA) with residual deficit 02/25/2012  . Unsteady gait 02/25/2012    Past Surgical History:  Procedure Laterality Date  . BREAST BIOPSY Right    pt not sure when   . CHOLECYSTECTOMY    . VAGINAL HYSTERECTOMY      Prior to Admission medications   Medication Sig Start Date End Date Taking? Authorizing Provider  acetaminophen (TYLENOL) 500 MG tablet Take 500 mg by mouth every 6 (six) hours as needed.    [provider]  alendronate (FOSAMAX) 70 MG tablet TAKE 1 TABLET EVERY 7 DAYS.  SEE PACKAGE FOR ADDITIONAL INSTRUCTIONS 06/12/16   Althia Forts, MD  ALPRAZolam Prudy Feeler) 0.25 MG tablet  10/24/17   [provider]  amLODipine (NORVASC) 10 MG tablet Take 1 tablet (10 mg total) by mouth daily. 12/24/17   Karamalegos, Netta Neat, DO  aspirin 81 MG tablet Take 1 tablet (81 mg total) by mouth daily. 03/07/13   Ronal Fear, NP  esomeprazole (NEXIUM) 20 MG capsule Take 1 capsule (20 mg total) by mouth daily before breakfast. 12/14/17   Althea Charon, Netta Neat, DO  FLUoxetine (PROZAC) 20 MG capsule Take 1 capsule (20 mg total) by mouth daily. 06/12/16   Althia Forts, MD  fluticasone (FLONASE) 50 MCG/ACT nasal spray fluticasone propionate 50 mcg/actuation nasal spray,suspension    [provider]  lisinopril (PRINIVIL,ZESTRIL) 40 MG tablet Take 1 tablet (40 mg total) by mouth daily. 10/17/16   Eulah Pont, MD  meclizine (ANTIVERT) 25 MG tablet Take 1 tablet (25 mg total) by mouth daily as needed for dizziness. 06/12/16   Althia Forts, MD  naproxen sodium (ALEVE) 220 MG tablet Take 440 mg by mouth 2 (two) times daily as needed.    [provider]  potassium chloride (K-DUR,KLOR-CON) 10 MEQ tablet Take 1 tablet (10 mEq total) by mouth daily. 10/17/16   Eulah Pont, MD  pravastatin (PRAVACHOL) 20 MG tablet Take 1 tablet (20 mg total) by mouth at bedtime. 12/05/17   Smitty Cords, DO      Allergies Demerol [meperidine]; Macrobid [nitrofurantoin macrocrystal]; and Penicillins  Family History  Problem Relation Age of Onset  . Heart attack Mother   . Heart attack Brother        death at age 16  . Stroke Brother        death at 2    Social History Social History   Tobacco Use  . Smoking status: Never Smoker  . Smokeless tobacco: Never Used  Substance Use Topics  . Alcohol use: No    Alcohol/week: 0.0 oz  . Drug use: No    Review of Systems  Constitutional: No fever/chills Eyes: No visual changes.  ENT: No neck pain Cardiovascular: As above Respiratory: No cough Gastrointestinal: No abdominal pain.  No nausea, no vomiting.   Genitourinary: Negative for dysuria. Musculoskeletal: Negative for back pain. Skin: Negative for rash. Neurological: Negative for headaches    ____________________________________________   PHYSICAL EXAM:  VITAL SIGNS: ED Triage Vitals  Enc Vitals Group     BP 12/31/17 1159 130/72     Pulse Rate 12/31/17 1159 90     Resp 12/31/17 1159 14     Temp 12/31/17 1159 98.1 F (36.7 C)     Temp Source 12/31/17 1159 Oral     SpO2 12/31/17 1159 96 %     Weight 12/31/17 1200 68.5 kg (151 lb)     Height 12/31/17 1200 1.575 m (5\' 2" )     Head Circumference --      Peak Flow --      Pain Score 12/31/17 1200 8     Pain Loc --      Pain Edu? --      Excl. in GC? --     Constitutional: Alert  . Pleasant and interactive Eyes: Conjunctivae are normal.   Nose: No congestion/rhinnorhea. Mouth/Throat: Mucous membranes are moist.   Neck:  Painless ROM, no vertebral tenderness palpation Cardiovascular: Normal rate, regular rhythm. Grossly normal heart sounds.  Good peripheral circulation.  Tenderness palpation over the central sternum and left anterior chest, no rash noted Respiratory: Normal respiratory effort.  No retractions. Lungs CTAB. Gastrointestinal: Soft and nontender. No distention.    Musculoskeletal: No lower extremity  tenderness nor edema.  Warm and well perfused Neurologic:  Normal speech and language. No gross focal neurologic deficits are appreciated.  Skin:  Skin is warm, dry and intact. No rash noted. Psychiatric: Mood and affect are normal. Speech and behavior are normal.  ____________________________________________   LABS (all labs ordered are listed, but only abnormal results are displayed)  Labs Reviewed  BASIC METABOLIC PANEL - Abnormal; Notable for the following components:      Result Value   Potassium 3.3 (*)    Glucose, Bld 136 (*)    Calcium 8.8 (*)    All other components within normal limits  CBC  TROPONIN I   ____________________________________________  EKG  ED ECG REPORT I, Jene Every, the attending physician, personally viewed and interpreted this ECG.  Date: 12/31/2017  Rhythm: normal sinus rhythm QRS Axis: normal Intervals: normal ST/T Wave abnormalities:  normal Narrative Interpretation: no evidence of acute ischemia  ____________________________________________  RADIOLOGY  Chest x-ray concerning for sternal fracture, mildly displaced ____________________________________________   PROCEDURES  Procedure(s) performed: No  Procedures   Critical Care performed: No ____________________________________________   INITIAL IMPRESSION / ASSESSMENT AND PLAN / ED COURSE  Pertinent labs & imaging results that were available during my care of the patient were reviewed by me and considered in my medical decision making (see chart for details).  Patient presents with anterior chest pain, chest wall tenderness.  Chest x-ray demonstrates mildly displaced sternal fracture, will send for CT to evaluate mediastinum.  Initially patient refused pain medication  CT demonstrates multiple fractures nondisplaced ribs, no mediastinal hematoma.  Discussed with daughter who reports the patient did have a fall.  Daughter is POA.  Patient agrees to 2 mg of IV  morphine.  Will admit to the hospitalist service for pain control    ____________________________________________   FINAL CLINICAL IMPRESSION(S) / ED DIAGNOSES  Final diagnoses:  Closed fracture of multiple ribs of both sides, initial encounter  Closed fracture of body of sternum, initial encounter        Note:  This document was prepared using Dragon voice recognition software and may include unintentional dictation errors.    Jene EveryKinner, Crescentia Boutwell, MD 12/31/17 1450

## 2017-12-31 NOTE — ED Notes (Signed)
Spoke with patient's daughter, Vernie ShanksLori Boone who is patient's healthcare POA. Updated on results and plan of care. Informed patient that MD would call her once we had results of CT scan.

## 2017-12-31 NOTE — H&P (Signed)
[facility] Physicians - [place] at [facility]   PATIENT NAME: Jill Hancock    MR#:  [id number]  DATE OF BIRTH:  [birth year]  DATE OF ADMISSION:  12/31/2017  PRIMARY CARE PHYSICIAN: Smitty Cords, DO   REQUESTING/REFERRING PHYSICIAN:   CHIEF COMPLAINT:   Chief Complaint  Patient presents with  . Chest Pain    HISTORY OF PRESENT ILLNESS: Jill Hancock  is a 77 y.o. female with a known history of hypertension, vertigo, CVA presented to the emergency room for chest discomfort and pain in the rib cage.  Patient had a fall a week ago at the beach in [place] secondary to loss of balance.  Since then she has pain in the sternal area as well as the rib cage.  Patient walks with the help of a walker.  She was evaluated in the emergency room and her CT chest showed a sternal fracture and bilateral rib fractures.  Patient has a lot of pain for which she took ibuprofen but still pain is not controlled.  Her troponin is normal.  EKG no ST segment elevation.  Hospitalist service was consulted for further care.  PAST MEDICAL HISTORY:   Past Medical History:  Diagnosis Date  . Hyperglycemia   . Hypertension   . Hypokalemia   . Stroke [facility]) 02/2012   MRI revealed at least 3 subcentimeter acute infarctions with one area of subacute infarction in widely disparate vascular territories including territory of left cerebellum and around right caudate nucleus along with widespread lacunar infarcts, chronic microvascular ischemic change, and numerous microbleeds suggesting long standing hypertensive cerebrovascular disease.  MRA confirmed intracranial  . Unsteady gait   . Vertigo     PAST SURGICAL HISTORY:  Past Surgical History:  Procedure Laterality Date  . BREAST BIOPSY Right    pt not sure when   . CHOLECYSTECTOMY    . VAGINAL HYSTERECTOMY      SOCIAL HISTORY:  Social History   Tobacco Use  . Smoking status: Never Smoker  . Smokeless tobacco: Never Used  Substance  Use Topics  . Alcohol use: No    Alcohol/week: 0.0 oz    FAMILY HISTORY:  Family History  Problem Relation Age of Onset  . Heart attack Mother   . Heart attack Brother        death at age 72  . Stroke Brother        death at 4    DRUG ALLERGIES:  Allergies  Allergen Reactions  . Demerol [Meperidine] Nausea And Vomiting  . Macrobid [Nitrofurantoin Macrocrystal]   . Penicillins Rash    REVIEW OF SYSTEMS:   CONSTITUTIONAL: No fever, fatigue or weakness.  EYES: No blurred or double vision.  EARS, NOSE, AND THROAT: No tinnitus or ear pain.  RESPIRATORY: No cough, shortness of breath, wheezing or hemoptysis.  CARDIOVASCULAR: Has chest pain, orthopnea, edema.  GASTROINTESTINAL: No nausea, vomiting, diarrhea or abdominal pain.  GENITOURINARY: No dysuria, hematuria.  ENDOCRINE: No polyuria, nocturia,  HEMATOLOGY: No anemia, easy bruising or bleeding SKIN: No rash or lesion. MUSCULOSKELETAL: No joint pain or arthritis.   Has rib cage pain NEUROLOGIC: No tingling, numbness, weakness.  PSYCHIATRY: No anxiety or depression.   MEDICATIONS AT HOME:  Prior to Admission medications   Medication Sig Start Date End Date Taking? Authorizing Provider  acetaminophen (TYLENOL) 500 MG tablet Take 500 mg by mouth every 6 (six) hours as needed for mild pain or headache.    Yes [provider]  alendronate (FOSAMAX)  70 MG tablet TAKE 1 TABLET EVERY 7 DAYS.  SEE PACKAGE FOR ADDITIONAL INSTRUCTIONS 06/12/16  Yes Althia Forts, MD  ALPRAZolam Prudy Feeler) 0.25 MG tablet Take 0.25 mg by mouth 2 (two) times daily as needed for anxiety.  10/24/17  Yes [provider]  amLODipine (NORVASC) 10 MG tablet Take 1 tablet (10 mg total) by mouth daily. 12/24/17  Yes Karamalegos, Netta Neat, DO  aspirin 81 MG tablet Take 1 tablet (81 mg total) by mouth daily. 03/07/13  Yes Ronal Fear, NP  esomeprazole (NEXIUM) 20 MG capsule Take 1 capsule (20 mg total) by mouth daily before breakfast. 12/14/17  Yes  Karamalegos, Netta Neat, DO  FLUoxetine (PROZAC) 20 MG capsule Take 1 capsule (20 mg total) by mouth daily. Patient taking differently: Take 20 mg by mouth at bedtime.  06/12/16  Yes Althia Forts, MD  lisinopril (PRINIVIL,ZESTRIL) 40 MG tablet Take 1 tablet (40 mg total) by mouth daily. 10/17/16  Yes Eulah Pont, MD  meclizine (ANTIVERT) 25 MG tablet Take 1 tablet (25 mg total) by mouth daily as needed for dizziness. 06/12/16  Yes Althia Forts, MD  naproxen sodium (ALEVE) 220 MG tablet Take 440 mg by mouth 2 (two) times daily as needed (pain).    Yes [provider]  potassium chloride (K-DUR,KLOR-CON) 10 MEQ tablet Take 1 tablet (10 mEq total) by mouth daily. 10/17/16  Yes Eulah Pont, MD  pravastatin (PRAVACHOL) 20 MG tablet Take 1 tablet (20 mg total) by mouth at bedtime. 12/05/17  Yes Karamalegos, Netta Neat, DO      PHYSICAL EXAMINATION:   VITAL SIGNS: Blood pressure 133/72, pulse 95, temperature 98.1 F (36.7 C), temperature source Oral, resp. rate 15, height 5\' 2"  (1.575 m), weight 68.5 kg (151 lb), SpO2 (!) 89 %.  GENERAL:  77 y.o.-year-old patient lying in the bed with no acute distress.  EYES: Pupils equal, round, reactive to light and accommodation. No scleral icterus. Extraocular muscles intact.  HEENT: Head atraumatic, normocephalic. Oropharynx and nasopharynx clear.  NECK:  Supple, no jugular venous distention. No thyroid enlargement, no tenderness.  LUNGS: Normal breath sounds bilaterally, no wheezing, rales,rhonchi or crepitation. No use of accessory muscles of respiration.  CARDIOVASCULAR: S1, S2 normal. No murmurs, rubs, or gallops.  Tenderness over the sternal area ABDOMEN: Soft, nontender, nondistended. Bowel sounds present. No organomegaly or mass.  EXTREMITIES: No pedal edema, cyanosis, or clubbing.  NEUROLOGIC: Cranial nerves II through XII are intact. Muscle strength 5/5 in all extremities. Sensation intact. Gait not checked.  PSYCHIATRIC: The patient is alert  and oriented x 3.  SKIN: No obvious rash, lesion, or ulcer.   LABORATORY PANEL:   CBC Recent Labs  Lab 12/31/17 1203  WBC 10.2  HGB 12.8  HCT 37.7  PLT 367  MCV 87.9  MCH 29.9  MCHC 34.0  RDW 14.4   ------------------------------------------------------------------------------------------------------------------  Chemistries  Recent Labs  Lab 12/31/17 1203  NA 137  K 3.3*  CL 101  CO2 23  GLUCOSE 136*  BUN 14  CREATININE 0.86  CALCIUM 8.8*   ------------------------------------------------------------------------------------------------------------------ estimated creatinine clearance is 49.7 mL/min (by C-G formula based on SCr of 0.86 mg/dL). ------------------------------------------------------------------------------------------------------------------ No results for input(s): TSH, T4TOTAL, T3FREE, THYROIDAB in the last 72 hours.  Invalid input(s): FREET3   Coagulation profile No results for input(s): INR, PROTIME in the last 168 hours. ------------------------------------------------------------------------------------------------------------------- No results for input(s): DDIMER in the last 72 hours. -------------------------------------------------------------------------------------------------------------------  Cardiac Enzymes Recent Labs  Lab 12/31/17 1203  TROPONINI <0.03   ------------------------------------------------------------------------------------------------------------------  Invalid input(s): POCBNP  ---------------------------------------------------------------------------------------------------------------  Urinalysis    Component Value Date/Time   COLORURINE YELLOW (A) 12/04/2017 1547   APPEARANCEUR CLOUDY (A) 12/04/2017 1547   APPEARANCEUR Cloudy (A) 04/16/2015 1535   LABSPEC 1.015 12/04/2017 1547   PHURINE 7.0 12/04/2017 1547   GLUCOSEU NEGATIVE 12/04/2017 1547   HGBUR SMALL (A) 12/04/2017 1547   BILIRUBINUR Negative  12/14/2017 1112   BILIRUBINUR Negative 04/16/2015 1535   KETONESUR NEGATIVE 12/04/2017 1547   PROTEINUR Negative 12/14/2017 1112   PROTEINUR NEGATIVE 12/04/2017 1547   UROBILINOGEN 0.2 12/14/2017 1112   UROBILINOGEN 1 09/16/2014 1500   NITRITE positive 12/14/2017 1112   NITRITE NEGATIVE 12/04/2017 1547   LEUKOCYTESUR Trace (A) 12/14/2017 1112   LEUKOCYTESUR 3+ (A) 04/16/2015 1535     RADIOLOGY: Dg Chest 2 View  Result Date: 12/31/2017 CLINICAL DATA:  Chest pain. EXAM: CHEST - 2 VIEW COMPARISON:  Radiographs of December 12, 2017. FINDINGS: Stable cardiomediastinal silhouette. No pneumothorax or pleural effusion is noted. Minimal bibasilar subsegmental atelectasis is noted. Mildly displaced sternal fracture is noted. Small hiatal hernia is noted. IMPRESSION: Probable mildly displaced sternal fracture. Minimal bibasilar subsegmental atelectasis. Small hiatal hernia. Electronically Signed   By: Lupita RaiderJames  Green Jr, M.D.   On: 12/31/2017 12:39   Ct Chest W Contrast  Result Date: 12/31/2017 CLINICAL DATA:  Sternum and anterior chest wall pain after fall 1 week ago. EXAM: CT CHEST WITH CONTRAST TECHNIQUE: Multidetector CT imaging of the chest was performed during intravenous contrast administration. CONTRAST:  75mL OMNIPAQUE IOHEXOL 300 MG/ML  SOLN COMPARISON:  Chest x-ray from same day. Lumbar spine x-rays dated December 13, 2016. FINDINGS: Cardiovascular: Borderline cardiomegaly. No pericardial effusion. Normal caliber thoracic aorta. No aortic dissection. Coronary, aortic arch, and branch vessel atherosclerotic vascular disease. No central pulmonary embolism. Mediastinum/Nodes: No mediastinal hematoma. No enlarged mediastinal, hilar, or axillary lymph nodes. Thyroid gland, trachea, and esophagus demonstrate no significant findings. Moderate hiatal hernia. Lungs/Pleura: No focal consolidation, pleural effusion, or pneumothorax. Minimal basilar predominant subpleural reticulation. 3 mm subpleural nodule in the  lingula (series 3, image 83). Upper Abdomen: No acute abnormality. Musculoskeletal: Mildly displaced fracture of the mid sternal body with 4 mm anterior displacement. Acute nondisplaced fractures of the left anterior third through fifth ribs and right anterior third rib. Chronic severe compression deformity of T12. IMPRESSION: 1. Mildly displaced fracture of the mid sternal body. No anterior mediastinal hematoma. 2. Nondisplaced fractures of the left anterior third through fifth ribs and right anterior third rib. No pneumothorax. 3. 3 mm pulmonary nodule in the lingula. No follow-up needed if patient is low-risk. Non-contrast chest CT can be considered in 12 months if patient is high-risk. This recommendation follows the consensus statement: Guidelines for Management of Incidental Pulmonary Nodules Detected on CT Images: From the Fleischner Society 2017; Radiology 2017; 284:228-243. 4. Moderate hiatal hernia. 5.  Aortic atherosclerosis (ICD10-I70.0). Electronically Signed   By: Obie DredgeWilliam T Derry M.D.   On: 12/31/2017 14:09    EKG: Orders placed or performed during the hospital encounter of 12/31/17  . EKG 12-Lead  . EKG 12-Lead  . ED EKG within 10 minutes  . ED EKG within 10 minutes    IMPRESSION AND PLAN:  77 year old female patient with history of CVA, vertigo, hypertension presented to the emergency room for fall and chest pain.  -Sternal fracture Pain management with IV morphine No hematoma noted  -Musculoskeletal chest pain Secondary to sternal fracture and rib cage fracture  -Rib fractures Pain management with IV morphine and oral tramadol  -  Gait instability Physical therapy evaluation for balance training  -DVT prophylaxis with subcu Lovenox daily  All the records are reviewed and case discussed with ED provider. Management plans discussed with the patient, family and they are in agreement.  CODE STATUS:Full code Advance Directive Documentation     Most Recent Value  Type of  Advance Directive  Healthcare Power of Attorney  Pre-existing out of facility DNR order (yellow form or pink MOST form)  -  "MOST" Form in Place?  -       TOTAL TIME TAKING CARE OF THIS PATIENT: 51 minutes.    Ihor Austin M.D on 12/31/2017 at 3:30 PM  Between 7am to 6pm - Pager - [phone]  After 6pm go to [email or web address] - password [email or web address] [facility]  [address]  Hospitalists  Office  [phone]  CC: Primary care physician; Smitty Cords, DO

## 2017-12-31 NOTE — Progress Notes (Signed)
PHARMACIST - PHYSICIAN COMMUNICATION  CONCERNING: P&T Medication Policy Regarding Oral Bisphosphonates  RECOMMENDATION: Your order for alendronate (Fosamax), ibandronate (Boniva), or risedronate (Actonel) has been discontinued at this time.  If the patient's post-hospital medical condition warrants safe use of this class of drugs, please resume the pre-hospital regimen upon discharge.  DESCRIPTION:  Alendronate (Fosamax), ibandronate (Boniva), and risedronate (Actonel) can cause severe esophageal erosions in patients who are unable to remain upright at least 30 minutes after taking this medication.   Since brief interruptions in therapy are thought to have minimal impact on bone mineral density, the Pharmacy & Therapeutics Committee has established that bisphosphonate orders should be routinely discontinued during hospitalization.   To override this safety policy and permit administration of Boniva, Fosamax, or Actonel in the hospital, prescribers must write "DO NOT HOLD" in the comments section when placing the order for this class of medications.  Gardner CandleSheema M Yoneko Talerico, PharmD, BCPS Clinical Pharmacist 12/31/2017 5:18 PM

## 2017-12-31 NOTE — Progress Notes (Signed)
Advanced care plan. Purpose of the Encounter: CODE STATUS Parties in Attendance:Patient Patient's Decision Capacity:Good Subjective/Patient's story: Return to emergency room for chest pain and rib cage pain from fall Objective/Medical story Has sternal and rib fractures secondary to fall Goals of care determination:  Advance care directives and goals of care discussed Patient wants everything done for now which includes CPR and intubation if the need arises CODE STATUS: Full code Time spent discussing advanced care planning: 16 minutes

## 2018-01-01 DIAGNOSIS — S2239XA Fracture of one rib, unspecified side, initial encounter for closed fracture: Secondary | ICD-10-CM | POA: Diagnosis not present

## 2018-01-01 DIAGNOSIS — S2220XA Unspecified fracture of sternum, initial encounter for closed fracture: Secondary | ICD-10-CM | POA: Diagnosis not present

## 2018-01-01 DIAGNOSIS — R079 Chest pain, unspecified: Secondary | ICD-10-CM | POA: Diagnosis not present

## 2018-01-01 DIAGNOSIS — R2681 Unsteadiness on feet: Secondary | ICD-10-CM | POA: Diagnosis not present

## 2018-01-01 LAB — CBC
HEMATOCRIT: 36.2 % (ref 35.0–47.0)
Hemoglobin: 12.2 g/dL (ref 12.0–16.0)
MCH: 29.7 pg (ref 26.0–34.0)
MCHC: 33.7 g/dL (ref 32.0–36.0)
MCV: 88 fL (ref 80.0–100.0)
PLATELETS: 352 10*3/uL (ref 150–440)
RBC: 4.12 MIL/uL (ref 3.80–5.20)
RDW: 14.3 % (ref 11.5–14.5)
WBC: 7.5 10*3/uL (ref 3.6–11.0)

## 2018-01-01 LAB — BASIC METABOLIC PANEL
ANION GAP: 7 (ref 5–15)
BUN: 9 mg/dL (ref 8–23)
CHLORIDE: 103 mmol/L (ref 98–111)
CO2: 30 mmol/L (ref 22–32)
CREATININE: 0.62 mg/dL (ref 0.44–1.00)
Calcium: 8.4 mg/dL — ABNORMAL LOW (ref 8.9–10.3)
Glucose, Bld: 112 mg/dL — ABNORMAL HIGH (ref 70–99)
POTASSIUM: 3.5 mmol/L (ref 3.5–5.1)
SODIUM: 140 mmol/L (ref 135–145)

## 2018-01-01 MED ORDER — CEPASTAT 14.5 MG MT LOZG
1.0000 | LOZENGE | OROMUCOSAL | Status: DC | PRN
  Administered 2018-01-01: 1 via BUCCAL
  Filled 2018-01-01 (×2): qty 9

## 2018-01-01 MED ORDER — PREDNISONE 10 MG PO TABS
10.0000 mg | ORAL_TABLET | Freq: Two times a day (BID) | ORAL | Status: DC
  Administered 2018-01-01 – 2018-01-02 (×4): 10 mg via ORAL
  Filled 2018-01-01 (×4): qty 1

## 2018-01-01 MED ORDER — GUAIFENESIN ER 600 MG PO TB12
600.0000 mg | ORAL_TABLET | Freq: Two times a day (BID) | ORAL | Status: DC
  Administered 2018-01-01 – 2018-01-02 (×2): 600 mg via ORAL
  Filled 2018-01-01 (×2): qty 1

## 2018-01-01 NOTE — Clinical Social Work Placement (Signed)
   CLINICAL SOCIAL WORK PLACEMENT  NOTE  Date:  01/01/2018  Patient Details  Name: Georgiann CockerLinda S Mckissack MRN: [id number] Date of Birth: Nov 06, 1940  Clinical Social Work is seeking post-discharge placement for this patient at the Skilled  Nursing Facility level of care (*CSW will initial, date and re-position this form in  chart as items are completed):  Yes   Patient/family provided with [place] Clinical Social Work Department's list of facilities offering this level of care within the geographic area requested by the patient (or if unable, by the patient's family).  Yes   Patient/family informed of their freedom to choose among providers that offer the needed level of care, that participate in Medicare, Medicaid or managed care program needed by the patient, have an available bed and are willing to accept the patient.  Yes   Patient/family informed of [place]'s ownership interest [facility] and [facility], as well as of the fact that they are under no obligation to receive care at these facilities.  PASRR submitted to EDS on 01/01/18     PASRR number received on 01/01/18     Existing PASRR number confirmed on       FL2 transmitted to all facilities in geographic area requested by pt/family on 01/01/18     FL2 transmitted to all facilities within larger geographic area on       Patient informed that his/her managed care company has contracts with or will negotiate with certain facilities, including the following:        Yes   Patient/family informed of bed offers received.  Patient chooses bed at ([facility] )     Physician recommends and patient chooses bed at      Patient to be transferred to   on  .  Patient to be transferred to facility by       Patient family notified on   of transfer.  Name of family member notified:        PHYSICIAN       Additional Comment:    _______________________________________________ Renika Shiflet, Darleen CrockerBailey M, LCSW 01/01/2018, 2:35  PM

## 2018-01-01 NOTE — Clinical Social Work Note (Signed)
Clinical Social Work Assessment  Patient Details  Name: Jill Hancock MRN: [id number] Date of Birth: 16-Jul-1940  Date of referral:  01/01/18               Reason for consult:  Facility Placement                Permission sought to share information with:  Chartered certified accountant granted to share information::  Yes, Verbal Permission Granted  Name::      [name]::   [place]   Relationship::     Contact Information:     Housing/Transportation Living arrangements for the past 2 months:  [place] of Information:  Patient, Adult Children Patient Interpreter Needed:  None Criminal Activity/Legal Involvement Pertinent to Current Situation/Hospitalization:  No - Comment as needed Significant Relationships:  Adult Children Lives with:  Self Do you feel safe going back to the place where you live?  Yes Need for family participation in patient care:  Yes (Comment)  Care giving concerns:  Patient lives at [facility] independent living community in [place].    Facilities manager / plan:  Holiday representative ([place]) reviewed chart and noted that PT is recommending SNF. CSW met with patient alone at bedside and she was sitting up in the chair. CSW introduced self and explained role of CSW department. Patient was alert and oriented X4. Patient reported that she lives alone in an independent living community and her daughter Jill Hancock is her HPOA. CSW explained SNF process and that [facility] will have to approve it. Patient is agreeable to SNF search in [facility] and prefers [facility]. FL2 complete and faxed out.   CSW presented bed offers to patient and she chose [facility]. Per Tammy admissions coordinator at [facility] she will start [facility] authorization today. CSW contacted patient's daughter Jill Hancock and made her aware of above. Per Jill Hancock she is [place] this [place] the beach and will be back Sunday 12/07/17. Per Jill Hancock she will  be available via telephone and is agreeable for patient to D/C to [facility]. CSW will continue to follow and assist as needed.   Employment status:  Retired Nurse, adult PT Recommendations:  [place] / Referral to community resources:  [place]  Patient/Family's Response to care:  Patient and her daughter are agreeable to [facility].   Patient/Family's Understanding of and Emotional Response to Diagnosis, Current Treatment, and Prognosis:  Patient and her daughter were very pleasant and thanked CSW for assistance.   Emotional Assessment Appearance:  Appears stated age Attitude/Demeanor/Rapport:    Affect (typically observed):  Accepting, Adaptable, Pleasant Orientation:  Oriented to Self, Oriented to Place, Oriented to Situation, Oriented to  Time Alcohol / Substance use:  Not Applicable Psych involvement (Current and /or in the community):  No (Comment)  Discharge Needs  Concerns to be addressed:  Discharge Planning Concerns Readmission within the last 30 days:  No Current discharge risk:  Dependent with Mobility Barriers to Discharge:  Continued Medical Work up   [employer], Veronia Beets, LCSW 01/01/2018, 2:36 PM

## 2018-01-01 NOTE — [place] FL2 (Addendum)
[place] MEDICAID FL2 LEVEL OF CARE SCREENING TOOL     IDENTIFICATION  Patient Name: Jill Hancock Birthdate: 08/05/1940 Sex: female Admission Date (Current Location): 12/31/2017  [place] and [place] Number:  Chiropodist and Address:  [facility], [address] [place], [place] [address]      Provider Number: [id number]  Attending Physician Name and Address:  Ramonita Lab, MD  Relative Name and Phone Number:       Current Level of Care: Hospital Recommended Level of Care: Skilled Nursing Facility Prior Approval Number:    Date Approved/Denied:   PASRR Number: ([id number])  Discharge Plan: SNF    Current Diagnoses: Patient Active Problem List   Diagnosis Date Noted  . Multiple rib fractures 12/31/2017  . Primary osteoarthritis of right knee 12/14/2017  . Dementia, vascular 12/04/2017  . Right shoulder injury 11/12/2015  . Urinary urgency 09/17/2015  . Hyperglycemia 07/29/2015  . Restless leg 06/07/2015  . Benign paroxysmal positional vertigo 06/07/2015  . Hyperlipidemia 04/16/2015  . Vitamin D deficiency 08/13/2014  . T12 compression fracture (HCC) 04/28/2014  . 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 Major depressive disorder, recurrent, unspecified (HCC) 11/24/2012  . GERD (gastroesophageal reflux disease) 11/24/2012  . Osteoporosis 09/05/2012  . Right hip pain 08/23/2012  . Anxiety 05/04/2012  . Hypertension 02/27/2012  . History of cerebrovascular accident (CVA) with residual deficit 02/25/2012  . Unsteady gait 02/25/2012    Orientation RESPIRATION BLADDER Height & Weight     Self, Time, Situation, Place  Normal Continent Weight: 151 lb (68.5 kg) Height:  5\' 2"  (157.5 cm)  BEHAVIORAL SYMPTOMS/MOOD NEUROLOGICAL BOWEL NUTRITION STATUS      Continent Diet(Diet: Heart Healthy )  AMBULATORY STATUS COMMUNICATION OF NEEDS Skin   Extensive Assist Verbally Normal                       Personal Care Assistance  Level of Assistance  Bathing, Feeding, Dressing Bathing Assistance: Limited assistance Feeding assistance: Independent Dressing Assistance: Limited assistance     Functional Limitations Info  Sight, Hearing, Speech Sight Info: Adequate Hearing Info: Adequate Speech Info: Adequate    SPECIAL CARE FACTORS FREQUENCY  PT (By licensed PT), OT (By licensed OT)     PT Frequency: (5) OT Frequency: (5)            Contractures      Additional Factors Info  Code Status, Allergies Code Status Info: (Full Code) Allergies Info: (Demerol Meperidine, Macrobid Nitrofurantoin Macrocrystal, Penicillins)           Current Medications (01/01/2018):  This is the current hospital active medication list Current Facility-Administered Medications  Medication Dose Route Frequency Provider Last Rate Last Dose  . 0.9 %  sodium chloride infusion  250 mL Intravenous PRN Pyreddy, Vivien Rota, MD      . acetaminophen (TYLENOL) tablet 650 mg  650 mg Oral Q6H PRN Pyreddy, Vivien Rota, MD       Or  . acetaminophen (TYLENOL) suppository 650 mg  650 mg Rectal Q6H PRN Ihor Austin, MD      . ALPRAZolam Prudy Feeler) tablet 0.25 mg  0.25 mg Oral TID PRN Ihor Austin, MD   0.25 mg at 01/01/18 0225  . amLODipine (NORVASC) tablet 10 mg  10 mg Oral Daily Pyreddy, Vivien Rota, MD   10 mg at 01/01/18 0941  . aspirin EC tablet 81 mg  81 mg Oral Daily Pyreddy, Vivien Rota, MD   81 mg at 01/01/18 0940  . enoxaparin (  LOVENOX) injection 40 mg  40 mg Subcutaneous Q24H Ihor AustinPyreddy, Pavan, MD   40 mg at 12/31/17 2101  . FLUoxetine (PROZAC) capsule 20 mg  20 mg Oral Daily Pyreddy, Vivien RotaPavan, MD   20 mg at 01/01/18 0943  . fluticasone (FLONASE) 50 MCG/ACT nasal spray 1 spray  1 spray Each Nare Daily Ihor AustinPyreddy, Pavan, MD   1 spray at 01/01/18 0943  . lisinopril (PRINIVIL,ZESTRIL) tablet 40 mg  40 mg Oral Daily Pyreddy, Vivien RotaPavan, MD   40 mg at 01/01/18 0941  . meclizine (ANTIVERT) tablet 25 mg  25 mg Oral Daily PRN Pyreddy, Vivien RotaPavan, MD      . morphine 2 MG/ML  injection 2 mg  2 mg Intravenous Q4H PRN Pyreddy, Vivien RotaPavan, MD      . ondansetron (ZOFRAN) tablet 4 mg  4 mg Oral Q6H PRN Ihor AustinPyreddy, Pavan, MD   4 mg at 12/31/17 2114   Or  . ondansetron (ZOFRAN) injection 4 mg  4 mg Intravenous Q6H PRN Ihor AustinPyreddy, Pavan, MD   4 mg at 01/01/18 0940  . pantoprazole (PROTONIX) EC tablet 40 mg  40 mg Oral Daily Pyreddy, Vivien RotaPavan, MD   40 mg at 01/01/18 0941  . potassium chloride (K-DUR,KLOR-CON) CR tablet 10 mEq  10 mEq Oral Daily Ihor AustinPyreddy, Pavan, MD   10 mEq at 01/01/18 0941  . pravastatin (PRAVACHOL) tablet 20 mg  20 mg Oral QHS Ihor AustinPyreddy, Pavan, MD   20 mg at 12/31/17 2101  . senna-docusate (Senokot-S) tablet 1 tablet  1 tablet Oral QHS PRN Pyreddy, Vivien RotaPavan, MD      . sodium chloride flush (NS) 0.9 % injection 3 mL  3 mL Intravenous Q12H Pyreddy, Vivien RotaPavan, MD   3 mL at 01/01/18 0959  . sodium chloride flush (NS) 0.9 % injection 3 mL  3 mL Intravenous PRN Pyreddy, Vivien RotaPavan, MD      . traMADol Janean Sark(ULTRAM) tablet 50 mg  50 mg Oral Q6H PRN Ihor AustinPyreddy, Pavan, MD   50 mg at 12/31/17 2114     Discharge Medications: Please see discharge summary for a list of discharge medications.  Relevant Imaging Results:  Relevant Lab Results:   Additional Information (SSN: [id number])  Railey Glad, Darleen CrockerBailey M, LCSW

## 2018-01-01 NOTE — Progress Notes (Addendum)
[facility] [place] [facility]   PATIENT NAME: Jill RalphLinda Hancock    MR#:  [id number]  DATE OF BIRTH:  12-01-1940  SUBJECTIVE:  CHIEF COMPLAINT:  Pt is reporting pain in rib cage with movements , no pain while resting, no sob  REVIEW OF SYSTEMS:  CONSTITUTIONAL: No fever, fatigue or weakness.  EYES: No blurred or double vision.  EARS, NOSE, AND THROAT: No tinnitus or ear pain.  RESPIRATORY: No cough, shortness of breath, wheezing or hemoptysis.  CARDIOVASCULAR: No chest pain, orthopnea, edema.  Reporting pain in the ribs with movement GASTROINTESTINAL: No nausea, vomiting, diarrhea or abdominal pain.  GENITOURINARY: No dysuria, hematuria.  ENDOCRINE: No polyuria, nocturia,  HEMATOLOGY: No anemia, easy bruising or bleeding SKIN: No rash or lesion. MUSCULOSKELETAL: No joint pain or arthritis.   NEUROLOGIC: No tingling, numbness, weakness.  PSYCHIATRY: No anxiety or depression.   DRUG ALLERGIES:   Allergies  Allergen Reactions  . Demerol [Meperidine] Nausea And Vomiting  . Macrobid [Nitrofurantoin Macrocrystal]   . Penicillins Rash    VITALS:  Blood pressure (!) 107/59, pulse 79, temperature 97.9 F (36.6 C), temperature source Oral, resp. rate 18, height 5\' 2"  (1.575 m), weight 68.5 kg (151 lb), SpO2 93 %.  PHYSICAL EXAMINATION:  GENERAL:  77 y.o.-year-old patient lying in the bed with no acute distress.  EYES: Pupils equal, round, reactive to light and accommodation. No scleral icterus. Extraocular muscles intact.  HEENT: Head atraumatic, normocephalic. Oropharynx and nasopharynx clear.  NECK:  Supple, no jugular venous distention. No thyroid enlargement, no tenderness.  LUNGS: Normal breath sounds bilaterally, no wheezing, rales,rhonchi or crepitation. No use of accessory muscles of respiration.  Anterior chest wall tenderness on palpation CARDIOVASCULAR: S1, S2 normal. No murmurs, rubs, or gallops.  ABDOMEN: Soft, nontender, nondistended. Bowel  sounds present. No organomegaly or mass.  EXTREMITIES: No pedal edema, cyanosis, or clubbing.  NEUROLOGIC: Cranial nerves II through XII are intact.Sensation intact. Gait not checked.  PSYCHIATRIC: The patient is alert and oriented x 3.  SKIN: No obvious rash, lesion, or ulcer.    LABORATORY PANEL:   CBC Recent Labs  Lab 01/01/18 0523  WBC 7.5  HGB 12.2  HCT 36.2  PLT 352   ------------------------------------------------------------------------------------------------------------------  Chemistries  Recent Labs  Lab 01/01/18 0523  NA 140  K 3.5  CL 103  CO2 30  GLUCOSE 112*  BUN 9  CREATININE 0.62  CALCIUM 8.4*   ------------------------------------------------------------------------------------------------------------------  Cardiac Enzymes Recent Labs  Lab 12/31/17 1203  TROPONINI <0.03   ------------------------------------------------------------------------------------------------------------------  RADIOLOGY:  Dg Chest 2 View  Result Date: 12/31/2017 CLINICAL DATA:  Chest pain. EXAM: CHEST - 2 VIEW COMPARISON:  Radiographs of December 12, 2017. FINDINGS: Stable cardiomediastinal silhouette. No pneumothorax or pleural effusion is noted. Minimal bibasilar subsegmental atelectasis is noted. Mildly displaced sternal fracture is noted. Small hiatal hernia is noted. IMPRESSION: Probable mildly displaced sternal fracture. Minimal bibasilar subsegmental atelectasis. Small hiatal hernia. Electronically Signed   By: Lupita RaiderJames  Green Jr, M.D.   On: 12/31/2017 12:39   Ct Chest W Contrast  Result Date: 12/31/2017 CLINICAL DATA:  Sternum and anterior chest wall pain after fall 1 week ago. EXAM: CT CHEST WITH CONTRAST TECHNIQUE: Multidetector CT imaging of the chest was performed during intravenous contrast administration. CONTRAST:  75mL OMNIPAQUE IOHEXOL 300 MG/ML  SOLN COMPARISON:  Chest x-ray from same day. Lumbar spine x-rays dated December 13, 2016. FINDINGS: Cardiovascular:  Borderline cardiomegaly. No pericardial effusion. Normal caliber thoracic aorta. No aortic dissection. Coronary,  aortic arch, and branch vessel atherosclerotic vascular disease. No central pulmonary embolism. Mediastinum/Nodes: No mediastinal hematoma. No enlarged mediastinal, hilar, or axillary lymph nodes. Thyroid gland, trachea, and esophagus demonstrate no significant findings. Moderate hiatal hernia. Lungs/Pleura: No focal consolidation, pleural effusion, or pneumothorax. Minimal basilar predominant subpleural reticulation. 3 mm subpleural nodule in the lingula (series 3, image 83). Upper Abdomen: No acute abnormality. Musculoskeletal: Mildly displaced fracture of the mid sternal body with 4 mm anterior displacement. Acute nondisplaced fractures of the left anterior third through fifth ribs and right anterior third rib. Chronic severe compression deformity of T12. IMPRESSION: 1. Mildly displaced fracture of the mid sternal body. No anterior mediastinal hematoma. 2. Nondisplaced fractures of the left anterior third through fifth ribs and right anterior third rib. No pneumothorax. 3. 3 mm pulmonary nodule in the lingula. No follow-up needed if patient is low-risk. Non-contrast chest CT can be considered in 12 months if patient is high-risk. This recommendation follows the consensus statement: Guidelines for Management of Incidental Pulmonary Nodules Detected on CT Images: From the Fleischner Society 2017; Radiology 2017; 284:228-243. 4. Moderate hiatal hernia. 5.  Aortic atherosclerosis (ICD10-I70.0). Electronically Signed   By: Obie Dredge M.D.   On: 12/31/2017 14:09    EKG:   Orders placed or performed during the hospital encounter of 12/31/17  . EKG 12-Lead  . EKG 12-Lead  . ED EKG within 10 minutes  . ED EKG within 10 minutes    ASSESSMENT AND PLAN:   77 year old female patient with history of CVA, vertigo, hypertension presented to the emergency room for fall and chest pain.  -Anterior  chest wall pain secondary to sternal fracture acute  chestwall pain secondary  to sternal fracture and rib cage fracture Pain management with IV morphine and oral tramadol  -History of stroke Continue home medication aspirin and statin  -History of hypertension Continue home medication amlodipine, lisinopril  -Past history of vertigo-currently patient denies any symptoms of vertigo Continue meclizine as needed PT evaluation  -Gait instability Physical therapy evaluation for balance training recommending skilled nursing facility-  -DVT prophylaxis with subcu Lovenox daily  Disposition skilled nursing facility recommended by physical therapy, patient and daughter are agreeable as patient lives alone with history of multiple falls in the past     All the records are reviewed and case discussed with Care Management/Social Workerr. Management plans discussed with the patient, family and they are in agreement.  CODE STATUS:fc   TOTAL TIME TAKING CARE OF THIS PATIENT: 35 minutes.   POSSIBLE D/C IN 1-2  DAYS, DEPENDING ON CLINICAL CONDITION.  Note: This dictation was prepared with Dragon dictation along with smaller phrase technology. Any transcriptional errors that result from this process are unintentional.   Ramonita Lab M.D on 01/01/2018 at 9:03 PM  Between 7am to 6pm - Pager - [phone] After 6pm go to [email or web address] - password [email or web address] [facility]  [facility]  Office  [phone]  CC: Primary care physician; Smitty Cords, DO

## 2018-01-01 NOTE — Plan of Care (Signed)
  Problem: Education: Goal: Knowledge of General Education information will improve Outcome: Progressing   Problem: Health Behavior/Discharge Planning: Goal: Ability to manage health-related needs will improve Outcome: Progressing   Problem: Clinical Measurements: Goal: Ability to maintain clinical measurements within normal limits will improve Outcome: Progressing Goal: Will remain free from infection Outcome: Progressing Goal: Diagnostic test results will improve Outcome: Progressing Goal: Respiratory complications will improve Outcome: Progressing Goal: Cardiovascular complication will be avoided Outcome: Progressing   Problem: Activity: Goal: Risk for activity intolerance will decrease Outcome: Progressing   Problem: Nutrition: Goal: Adequate nutrition will be maintained Outcome: Progressing   

## 2018-01-01 NOTE — Evaluation (Signed)
Physical Therapy Evaluation Patient Details Name: Jill Hancock Xiong MRN: [id number] DOB: Nov 15, 1940 Today'Hancock Date: 01/01/2018   History of Present Illness  Pt presents to hospital 12/31/17 with chest pain and nausea. Pt diagnosed with multiple bilateral rib fractures and a displaced sternal fracture. Pt states that pain began one week ago when [place]. Pt has a past medical history that includes a stroke in 2013 , HTN, moderate hiatial hernia, and aortic atherosclerosis.    Clinical Impression  Pt is a pleasant 77 year old female who was admitted for B rib fractures and a displaced sternal fracture secondary to a mechanical fall one week ago. Pt performs bed mobility modified independently with increased time needed due to minimal UE support secondary to pain. Pt performs transfers and ambulation with CGA to min assist. Pt demonstrates deficits with strength, ROM, and pain. Pt hesitant to bear weight through UE using RW secondary to pain which increases pts unsteadiness during gait. Unsure if balance deficits due to PLOF due to stroke in 2013 or acute changes. Pt amb 10' however further amb held due to unsteadiness and fatigue. Pt demonstrates WFL LE strength equal bilaterally and WNL sensation on B LE.  Pt demonstrates impaired cognition during current session as well as poor safety awareness secondary to impaired cognition. Pt could benefit from continued skilled therapy at this time to improve deficits toward PLOF especially safety awareness. PT will continue to work with pt 7x/week while admitted. D/c recommendations at this time are SNF.      Follow Up Recommendations SNF    Equipment Recommendations  None recommended by PT    Recommendations for Other Services       Precautions / Restrictions Precautions Precautions: None Restrictions Weight Bearing Restrictions: No Other Position/Activity Restrictions: up as tolerated      Mobility  Bed Mobility Overal  bed mobility: Modified Independent             General bed mobility comments: pt requires increased time to perform. Pt able to perform without physical assistance and limited UE use due to pain.  Transfers Overall transfer level: Needs assistance Equipment used: Rolling walker (2 wheeled) Transfers: Sit to/from Stand Sit to Stand: Min assist         General transfer comment: pt requires min assist sit<>stand from an elevated surface due to decreased UE use secondary to pain.  Ambulation/Gait Ambulation/Gait assistance: Min assist;Min guard Gait Distance (Feet): 10 Feet Assistive device: Rolling walker (2 wheeled) Gait Pattern/deviations: Shuffle     General Gait Details: pt amb 10 using RW and CGA to min assist. Pt very unsteady on feet. Pt uses RW safely however does not bear much weight through UE secondary to pain. Pt unable to amb further due to unsteadiness and fatigue  Stairs            Wheelchair Mobility    Modified Rankin (Stroke Patients Only)       Balance Overall balance assessment: Needs assistance   Sitting balance-Leahy Scale: Good Sitting balance - Comments: pt sits unsupported EOB for 2 minutes without gross LOB     Standing balance-Leahy Scale: Fair Standing balance comment: Pt requires B UE support on RW and CGA to maintain upright posture. Pt fearful to bear much weight through UE using RW.                             Pertinent Vitals/Pain Pain  Assessment: 0-10 Pain Score: 5  Pain Location: chest Pain Descriptors / Indicators: Aching;Cramping;Sharp Pain Intervention(Hancock): Limited activity within patient'Hancock tolerance;Monitored during session;Repositioned    Home Living Family/patient expects to be discharged to:: Private residence(independent living facility) Living Arrangements: Alone Available Help at Discharge: Family Type of Home: (4 daughters take turns checking on her once per day) Home Access: Level entry      Home Layout: One level Home Equipment: Environmental consultant - 4 wheels;Bedside commode      Prior Function Level of Independence: Independent with assistive device(Hancock)         Comments: previous history of mechanical falls one occuring one week ago one about a year ago.     Hand Dominance        Extremity/Trunk Assessment   Upper Extremity Assessment Upper Extremity Assessment: Overall WFL for tasks assessed(Increased chest pain with UE movement)    Lower Extremity Assessment Lower Extremity Assessment: RLE deficits/detail;LLE deficits/detail RLE Deficits / Details: Grossly at least 4/5; hip flexion, knee flexion/extension and DF 4+/5 RLE Sensation: WNL LLE Deficits / Details: Grossly at least 4/5; hip flexion, knee flexion/extension and DF 4+/5 LLE Sensation: WNL       Communication   Communication: Expressive difficulties(due to previous stroke increased time to understand)  Cognition Arousal/Alertness: Awake/alert Behavior During Therapy: WFL for tasks assessed/performed Overall Cognitive Status: Impaired/Different from baseline Area of Impairment: Orientation                 Orientation Level: Disoriented to;Place;Time             General Comments: pt A&O x2. Unsure of cognitive PLOF. pt provides variable information throughout stating at different times throughout session that she does not use an AD, uses a 2ww, and uses a 4ww.      General Comments      Exercises Other Exercises Other Exercises: Pt instructed in B LE exercises x10 with verbal cuing for safety and technique including SLR, hip abd, LAQ, seated marching, and standing marching   Assessment/Plan    PT Assessment Patient needs continued PT services  PT Problem List Decreased strength;Decreased range of motion;Decreased balance;Decreased activity tolerance;Decreased mobility;Decreased coordination;Decreased cognition;Decreased knowledge of use of DME;Decreased safety awareness;Pain       PT  Treatment Interventions DME instruction;Gait training;Stair training;Functional mobility training;Therapeutic activities;Therapeutic exercise;Balance training;Neuromuscular re-education    PT Goals (Current goals can be found in the Care Plan section)  Acute Rehab PT Goals Patient Stated Goal: pt states that she wants to get up and move PT Goal Formulation: With patient Time For Goal Achievement: 01/15/18 Potential to Achieve Goals: Good    Frequency 7X/week   Barriers to discharge        Co-evaluation               AM-PAC PT "6 Clicks" Daily Activity  Outcome Measure Difficulty turning over in bed (including adjusting bedclothes, sheets and blankets)?: A Little Difficulty moving from lying on back to sitting on the side of the bed? : A Little Difficulty sitting down on and standing up from a chair with arms (e.g., wheelchair, bedside commode, etc,.)?: Unable Help needed moving to and from a bed to chair (including a wheelchair)?: A Little Help needed walking in hospital room?: A Lot Help needed climbing 3-5 steps with a railing? : A Lot 6 Click Score: 14    End of Session Equipment Utilized During Treatment: Gait belt Activity Tolerance: Patient tolerated treatment well;Patient limited by fatigue;Patient limited by pain Patient  left: in chair;with call bell/phone within reach;with chair alarm set;with SCD'Hancock reapplied Nurse Communication: Mobility status;Other (comment)(requesting nausea pain meds) PT Visit Diagnosis: Unsteadiness on feet (R26.81);Other abnormalities of gait and mobility (R26.89);Repeated falls (R29.6);Muscle weakness (generalized) (M62.81);History of falling (Z91.81);Difficulty in walking, not elsewhere classified (R26.2);Pain Pain - Right/Left: (central chest) Pain - part of body: (chest)    Time: 2130-8657 PT Time Calculation (min) (ACUTE ONLY): 38 min   Charges:         PT G Codes:        Kanan Sobek, SPT   Modelle Vollmer 01/01/2018, 10:30  AM

## 2018-01-02 DIAGNOSIS — S2222XA Fracture of body of sternum, initial encounter for closed fracture: Secondary | ICD-10-CM | POA: Diagnosis not present

## 2018-01-02 DIAGNOSIS — E876 Hypokalemia: Secondary | ICD-10-CM | POA: Diagnosis not present

## 2018-01-02 DIAGNOSIS — S2239XA Fracture of one rib, unspecified side, initial encounter for closed fracture: Secondary | ICD-10-CM | POA: Diagnosis not present

## 2018-01-02 DIAGNOSIS — R2681 Unsteadiness on feet: Secondary | ICD-10-CM | POA: Diagnosis not present

## 2018-01-02 DIAGNOSIS — I2510 Atherosclerotic heart disease of native coronary artery without angina pectoris: Secondary | ICD-10-CM | POA: Diagnosis not present

## 2018-01-02 DIAGNOSIS — S2243XD Multiple fractures of ribs, bilateral, subsequent encounter for fracture with routine healing: Secondary | ICD-10-CM | POA: Diagnosis not present

## 2018-01-02 DIAGNOSIS — M6281 Muscle weakness (generalized): Secondary | ICD-10-CM | POA: Diagnosis not present

## 2018-01-02 DIAGNOSIS — Z7401 Bed confinement status: Secondary | ICD-10-CM | POA: Diagnosis not present

## 2018-01-02 DIAGNOSIS — R531 Weakness: Secondary | ICD-10-CM | POA: Diagnosis not present

## 2018-01-02 DIAGNOSIS — R1312 Dysphagia, oropharyngeal phase: Secondary | ICD-10-CM | POA: Diagnosis not present

## 2018-01-02 DIAGNOSIS — R42 Dizziness and giddiness: Secondary | ICD-10-CM | POA: Diagnosis not present

## 2018-01-02 DIAGNOSIS — S2249XA Multiple fractures of ribs, unspecified side, initial encounter for closed fracture: Secondary | ICD-10-CM | POA: Diagnosis not present

## 2018-01-02 DIAGNOSIS — Z8673 Personal history of transient ischemic attack (TIA), and cerebral infarction without residual deficits: Secondary | ICD-10-CM | POA: Diagnosis not present

## 2018-01-02 DIAGNOSIS — J9811 Atelectasis: Secondary | ICD-10-CM | POA: Diagnosis not present

## 2018-01-02 DIAGNOSIS — K449 Diaphragmatic hernia without obstruction or gangrene: Secondary | ICD-10-CM | POA: Diagnosis not present

## 2018-01-02 DIAGNOSIS — S2243XA Multiple fractures of ribs, bilateral, initial encounter for closed fracture: Secondary | ICD-10-CM | POA: Diagnosis not present

## 2018-01-02 DIAGNOSIS — R41841 Cognitive communication deficit: Secondary | ICD-10-CM | POA: Diagnosis not present

## 2018-01-02 DIAGNOSIS — I10 Essential (primary) hypertension: Secondary | ICD-10-CM | POA: Diagnosis not present

## 2018-01-02 DIAGNOSIS — S2222XD Fracture of body of sternum, subsequent encounter for fracture with routine healing: Secondary | ICD-10-CM | POA: Diagnosis not present

## 2018-01-02 DIAGNOSIS — I700 Atherosclerosis of aorta: Secondary | ICD-10-CM | POA: Diagnosis not present

## 2018-01-02 DIAGNOSIS — F419 Anxiety disorder, unspecified: Secondary | ICD-10-CM | POA: Diagnosis not present

## 2018-01-02 DIAGNOSIS — N390 Urinary tract infection, site not specified: Secondary | ICD-10-CM | POA: Diagnosis not present

## 2018-01-02 DIAGNOSIS — R911 Solitary pulmonary nodule: Secondary | ICD-10-CM | POA: Diagnosis not present

## 2018-01-02 DIAGNOSIS — Z5189 Encounter for other specified aftercare: Secondary | ICD-10-CM | POA: Diagnosis not present

## 2018-01-02 DIAGNOSIS — F411 Generalized anxiety disorder: Secondary | ICD-10-CM | POA: Diagnosis not present

## 2018-01-02 DIAGNOSIS — S2220XA Unspecified fracture of sternum, initial encounter for closed fracture: Secondary | ICD-10-CM | POA: Diagnosis not present

## 2018-01-02 DIAGNOSIS — K219 Gastro-esophageal reflux disease without esophagitis: Secondary | ICD-10-CM | POA: Diagnosis not present

## 2018-01-02 DIAGNOSIS — R079 Chest pain, unspecified: Secondary | ICD-10-CM | POA: Diagnosis not present

## 2018-01-02 DIAGNOSIS — E785 Hyperlipidemia, unspecified: Secondary | ICD-10-CM | POA: Diagnosis not present

## 2018-01-02 MED ORDER — TRAMADOL HCL 50 MG PO TABS: 50.0000 mg | ORAL_TABLET | Freq: Four times a day (QID) | ORAL | 0 refills | Status: DC | PRN

## 2018-01-02 MED ORDER — ALPRAZOLAM 0.25 MG PO TABS: 0.2500 mg | ORAL_TABLET | Freq: Two times a day (BID) | ORAL | 0 refills | Status: DC | PRN

## 2018-01-02 MED ORDER — TRAMADOL HCL 50 MG PO TABS: 50.0000 mg | ORAL_TABLET | Freq: Four times a day (QID) | ORAL | Status: DC | PRN

## 2018-01-02 NOTE — Progress Notes (Signed)
[facility] - [place] at [facility]   PATIENT NAME: Jill RalphLinda Hancock    MR#:  [id number]  DATE OF BIRTH:  02-Jan-1941  SUBJECTIVE:   Pt is reporting pain in rib cage with movements , no pain while resting, no sob  REVIEW OF SYSTEMS:  CONSTITUTIONAL: No fever, fatigue or weakness.  EYES: No blurred or double vision.  EARS, NOSE, AND THROAT: No tinnitus or ear pain.  RESPIRATORY: No cough, shortness of breath, wheezing or hemoptysis.  CARDIOVASCULAR: No chest pain, orthopnea, edema.  Reporting pain in the ribs with movement GASTROINTESTINAL: No nausea, vomiting, diarrhea or abdominal pain.  GENITOURINARY: No dysuria, hematuria.  ENDOCRINE: No polyuria, nocturia,  HEMATOLOGY: No anemia, easy bruising or bleeding SKIN: No rash or lesion. MUSCULOSKELETAL: No joint pain or arthritis.   NEUROLOGIC: No tingling, numbness, weakness.  PSYCHIATRY: No anxiety or depression.   DRUG ALLERGIES:   Allergies  Allergen Reactions  . Demerol [Meperidine] Nausea And Vomiting  . Macrobid [Nitrofurantoin Macrocrystal]   . Penicillins Rash    VITALS:  Blood pressure 124/68, pulse 77, temperature 98 F (36.7 C), temperature source Oral, resp. rate 18, height 5\' 2"  (1.575 m), weight 68.5 kg (151 lb), SpO2 96 %.  PHYSICAL EXAMINATION:  GENERAL:  77 y.o.-year-old patient lying in the bed with no acute distress.  EYES: Pupils equal, round, reactive to light and accommodation. No scleral icterus. Extraocular muscles intact.  HEENT: Head atraumatic, normocephalic. Oropharynx and nasopharynx clear.  NECK:  Supple, no jugular venous distention. No thyroid enlargement, no tenderness.  LUNGS: Normal breath sounds bilaterally, no wheezing, rales,rhonchi or crepitation. No use of accessory muscles of respiration.  Anterior chest wall tenderness on palpation CARDIOVASCULAR: S1, S2 normal. No murmurs, rubs, or gallops.  ABDOMEN: Soft, nontender, nondistended. Bowel sounds present. No  organomegaly or mass.  EXTREMITIES: No pedal edema, cyanosis, or clubbing.  NEUROLOGIC: Cranial nerves II through XII are intact.Sensation intact. Gait not checked.  PSYCHIATRIC: The patient is alert and oriented x 3.  SKIN: No obvious rash, lesion, or ulcer.    LABORATORY PANEL:   CBC Recent Labs  Lab 01/01/18 0523  WBC 7.5  HGB 12.2  HCT 36.2  PLT 352   ------------------------------------------------------------------------------------------------------------------  Chemistries  Recent Labs  Lab 01/01/18 0523  NA 140  K 3.5  CL 103  CO2 30  GLUCOSE 112*  BUN 9  CREATININE 0.62  CALCIUM 8.4*   ------------------------------------------------------------------------------------------------------------------  Cardiac Enzymes Recent Labs  Lab 12/31/17 1203  TROPONINI <0.03   ------------------------------------------------------------------------------------------------------------------  RADIOLOGY:  Dg Chest 2 View  Result Date: 12/31/2017 CLINICAL DATA:  Chest pain. EXAM: CHEST - 2 VIEW COMPARISON:  Radiographs of December 12, 2017. FINDINGS: Stable cardiomediastinal silhouette. No pneumothorax or pleural effusion is noted. Minimal bibasilar subsegmental atelectasis is noted. Mildly displaced sternal fracture is noted. Small hiatal hernia is noted. IMPRESSION: Probable mildly displaced sternal fracture. Minimal bibasilar subsegmental atelectasis. Small hiatal hernia. Electronically Signed   By: Lupita RaiderJames  Green Jr, M.D.   On: 12/31/2017 12:39   Ct Chest W Contrast  Result Date: 12/31/2017 CLINICAL DATA:  Sternum and anterior chest wall pain after fall 1 week ago. EXAM: CT CHEST WITH CONTRAST TECHNIQUE: Multidetector CT imaging of the chest was performed during intravenous contrast administration. CONTRAST:  75mL OMNIPAQUE IOHEXOL 300 MG/ML  SOLN COMPARISON:  Chest x-ray from same day. Lumbar spine x-rays dated December 13, 2016. FINDINGS: Cardiovascular: Borderline cardiomegaly.  No pericardial effusion. Normal caliber thoracic aorta. No aortic dissection. Coronary, aortic arch, and  branch vessel atherosclerotic vascular disease. No central pulmonary embolism. Mediastinum/Nodes: No mediastinal hematoma. No enlarged mediastinal, hilar, or axillary lymph nodes. Thyroid gland, trachea, and esophagus demonstrate no significant findings. Moderate hiatal hernia. Lungs/Pleura: No focal consolidation, pleural effusion, or pneumothorax. Minimal basilar predominant subpleural reticulation. 3 mm subpleural nodule in the lingula (series 3, image 83). Upper Abdomen: No acute abnormality. Musculoskeletal: Mildly displaced fracture of the mid sternal body with 4 mm anterior displacement. Acute nondisplaced fractures of the left anterior third through fifth ribs and right anterior third rib. Chronic severe compression deformity of T12. IMPRESSION: 1. Mildly displaced fracture of the mid sternal body. No anterior mediastinal hematoma. 2. Nondisplaced fractures of the left anterior third through fifth ribs and right anterior third rib. No pneumothorax. 3. 3 mm pulmonary nodule in the lingula. No follow-up needed if patient is low-risk. Non-contrast chest CT can be considered in 12 months if patient is high-risk. This recommendation follows the consensus statement: Guidelines for Management of Incidental Pulmonary Nodules Detected on CT Images: From the Fleischner Society 2017; Radiology 2017; 284:228-243. 4. Moderate hiatal hernia. 5.  Aortic atherosclerosis (ICD10-I70.0). Electronically Signed   By: Obie Dredge M.D.   On: 12/31/2017 14:09    EKG:   Orders placed or performed during the hospital encounter of 12/31/17  . EKG 12-Lead  . EKG 12-Lead  . ED EKG within 10 minutes  . ED EKG within 10 minutes    ASSESSMENT AND PLAN:   77 year old female patient with history of CVA, vertigo, hypertension presented to the emergency room for fall and chest pain.  -Anterior chest wall pain secondary  to mid sternal fracture and rib cage fracture Pain management with IV morphine and oral tramadol Incentive spirometer  -History of stroke Continue home medication aspirin and statin  -History of hypertension Continue  amlodipine, lisinopril  -Past history of vertigo-currently patient denies any symptoms of vertigo Continue meclizine as needed PT evaluation noted  -Gait instability Physical therapy evaluation for balance training recommending skilled nursing facility  -DVT prophylaxis with subcu Lovenox daily  Disposition skilled nursing facility recommended by physical therapy, patient and daughter are agreeable as patient lives alone with history of multiple falls in the past  Awaiting insurance authorization.   All the records are reviewed and case discussed with Care Management/Social Workerr. Management plans discussed with the patient, family and they are in agreement.  CODE STATUS:full code  TOTAL TIME TAKING CARE OF THIS PATIENT: 25 minutes.    Note: This dictation was prepared with Dragon dictation along with smaller phrase technology. Any transcriptional errors that result from this process are unintentional.   Enedina Finner M.D on 01/02/2018 at 9:54 AM  Between 7am to 6pm - Pager - [phone] After 6pm go to [email or web address] - password [email or web address] [facility]  [place] [facility]  Office  [phone]  CC: Primary care physician; Smitty Cords, DO

## 2018-01-02 NOTE — Care Management Obs Status (Signed)
MEDICARE OBSERVATION STATUS NOTIFICATION   Patient Details  Name: Jill CockerLinda S Nicodemus MRN: [id number] Date of Birth: 1940/12/17   Medicare Observation Status Notification Given:  Yes    Marily MemosLisa M Reegan Bouffard, RN 01/02/2018, 11:40 AM

## 2018-01-02 NOTE — Care Management CC44 (Signed)
Condition Code 44 Documentation Completed  Patient Details  Name: Georgiann CockerLinda S Pardue MRN: [id number] Date of Birth: 14-Sep-1940   Condition Code 44 given:  Yes Patient signature on Condition Code 44 notice:  Yes Documentation of 2 MD's agreement:  Yes Code 44 added to claim:  Yes    Marily MemosLisa M Deitrick Ferreri, RN 01/02/2018, 11:40 AM

## 2018-01-02 NOTE — Progress Notes (Signed)
Report called to [facility] at [facility]. All questions answered. Patient going to room 805. Per Social Work note daughter Lawson FiscalLori has been notified. Two scripts sent with patient. Patient to be discharged via EMS.

## 2018-01-02 NOTE — Discharge Summary (Addendum)
[facility] - [place] at [facility]   PATIENT NAME: Jill Hancock    MR#:  [id number]  DATE OF BIRTH:  [birth year]  DATE OF ADMISSION:  12/31/2017 ADMITTING PHYSICIAN: Ihor Austin, MD  DATE OF DISCHARGE: 01/02/2018 PRIMARY CARE PHYSICIAN: Smitty Cords, DO    ADMISSION DIAGNOSIS:  Closed fracture of body of sternum, initial encounter [S22.22XA] Closed fracture of multiple ribs of both sides, initial encounter [S22.43XA]  DISCHARGE DIAGNOSIS:   Closed fracture of anterior mid sternum with multiple rib fractures bilaterally s/p mechanical fall at home  SECONDARY DIAGNOSIS:   Past Medical History:  Diagnosis Date  . Hyperglycemia   . Hypertension   . Hypokalemia   . Stroke [facility]) 02/2012   MRI revealed at least 3 subcentimeter acute infarctions with one area of subacute infarction in widely disparate vascular territories including territory of left cerebellum and around right caudate nucleus along with widespread lacunar infarcts, chronic microvascular ischemic change, and numerous microbleeds suggesting long standing hypertensive cerebrovascular disease.  MRA confirmed intracranial  . Unsteady gait   . Vertigo     HOSPITAL COURSE:  77 year old female patient with history of CVA, vertigo, hypertension presented to the emergency room for fall and chest pain.  -Anterior chest wall pain secondary to mid sternal fracture and rib cage fracture Pain management with prn pain meds Incentive spirometer  -History of stroke Continue home medication aspirin and statin  -History of hypertension Continue  amlodipine, lisinopril  -Past history of vertigo-currently patient denies any symptoms of vertigo Continue meclizine as needed PT evaluation noted  -Gait instability Physical therapy evaluation for balance training recommending skilled nursing facility  -DVT prophylaxis with subcu Lovenox daily  Disposition skilled nursing facility  recommended by physical therapy, patient and daughter are agreeable as patient lives alone with history of multiple falls in the past. Insurance authorization obtained. D[facility] to [facility].  CONSULTS OBTAINED:    DRUG ALLERGIES:   Allergies  Allergen Reactions  . Demerol [Meperidine] Nausea And Vomiting  . Macrobid [Nitrofurantoin Macrocrystal]   . Penicillins Rash    DISCHARGE MEDICATIONS:   Allergies as of 01/02/2018      Reactions   Demerol [meperidine] Nausea And Vomiting   Macrobid [nitrofurantoin Macrocrystal]    Penicillins Rash      Medication List    STOP taking these medications   naproxen sodium 220 MG tablet Commonly known as:  ALEVE     TAKE these medications   acetaminophen 500 MG tablet Commonly known as:  TYLENOL Take 500 mg by mouth every 6 (six) hours as needed for mild pain or headache.   alendronate 70 MG tablet Commonly known as:  FOSAMAX TAKE 1 TABLET EVERY 7 DAYS.  SEE PACKAGE FOR ADDITIONAL INSTRUCTIONS   ALPRAZolam 0.25 MG tablet Commonly known as:  XANAX Take 1 tablet (0.25 mg total) by mouth 2 (two) times daily as needed for anxiety.   amLODipine 10 MG tablet Commonly known as:  NORVASC Take 1 tablet (10 mg total) by mouth daily.   aspirin 81 MG tablet Take 1 tablet (81 mg total) by mouth daily.   esomeprazole 20 MG capsule Commonly known as:  NEXIUM Take 1 capsule (20 mg total) by mouth daily before breakfast.   FLUoxetine 20 MG capsule Commonly known as:  PROZAC Take 1 capsule (20 mg total) by mouth daily. What changed:  when to take this   lisinopril 40 MG tablet Commonly known as:  PRINIVIL,ZESTRIL Take 1 tablet (40 mg total)  by mouth daily.   meclizine 25 MG tablet Commonly known as:  ANTIVERT Take 1 tablet (25 mg total) by mouth daily as needed for dizziness.   potassium chloride 10 MEQ tablet Commonly known as:  K-DUR,KLOR-CON Take 1 tablet (10 mEq total) by mouth daily.   pravastatin 20 MG tablet Commonly  known as:  PRAVACHOL Take 1 tablet (20 mg total) by mouth at bedtime.   traMADol 50 MG tablet Commonly known as:  ULTRAM Take 1 tablet (50 mg total) by mouth every 6 (six) hours as needed for severe pain.       If you experience worsening of your admission symptoms, develop shortness of breath, life threatening emergency, suicidal or homicidal thoughts you must seek medical attention immediately by calling 911 or calling your MD immediately  if symptoms less severe.  You Must read complete instructions/literature along with all the possible adverse reactions/side effects for all the Medicines you take and that have been prescribed to you. Take any new Medicines after you have completely understood and accept all the possible adverse reactions/side effects.   Please note  You were cared for by a hospitalist during your hospital stay. If you have any questions about your discharge medications or the care you received while you were in the hospital after you are discharged, you can call the unit and asked to speak with the hospitalist on call if the hospitalist that took care of you is not available. Once you are discharged, your primary care physician will handle any further medical issues. Please note that NO REFILLS for any discharge medications will be authorized once you are discharged, as it is imperative that you return to your primary care physician (or establish a relationship with a primary care physician if you do not have one) for your aftercare needs so that they can reassess your need for medications and monitor your lab values.  DATA REVIEW:   CBC  Recent Labs  Lab 01/01/18 0523  WBC 7.5  HGB 12.2  HCT 36.2  PLT 352    Chemistries  Recent Labs  Lab 01/01/18 0523  NA 140  K 3.5  CL 103  CO2 30  GLUCOSE 112*  BUN 9  CREATININE 0.62  CALCIUM 8.4*    Microbiology Results   No results found for this or any previous visit (from the past 240 hour(s)).  RADIOLOGY:   No results found.   Management plans discussed with the patient, family and they are in agreement.  CODE STATUS:     Code Status Orders  (From admission, onward)        Start     Ordered   12/31/17 1642  Full code  Continuous     12/31/17 1641    Code Status History    This patient has a current code status but no historical code status.    Advance Directive Documentation     Most Recent Value  Type of Advance Directive  Healthcare Power of Attorney  Pre-existing out of facility DNR order (yellow form or pink MOST form)  -  "MOST" Form in Place?  -      TOTAL TIME TAKING CARE OF THIS PATIENT: 40 minutes.    Enedina FinnerSona Mi Balla M.D on 01/02/2018 at 5:09 PM  Between 7am to 6pm - Pager - [phone] After 6pm go to [email or web address] - Social research officer, governmentpassword [email or web address]  [place] [facility]  Office  [phone]  CC: Primary care physician; Smitty CordsKaramalegos, Alexander J, DO

## 2018-01-02 NOTE — Progress Notes (Signed)
Patient is medically stable for D/C to [facility] today. [facility] has been received and patient can come today to room 805. RN will call report and arrange EMS for transport. Clinical Child psychotherapistocial Worker (CSW) sent D/C orders to [facility] via [facility]. Patient is aware of above. CSW contacted patient's daughter Lawson FiscalLori and made her aware of above. Please reconsult if future social work needs arise. CSW signing off.   Baker Hughes IncorporatedBailey Vonn Sliger, LCSW [phone]

## 2018-01-02 NOTE — Progress Notes (Signed)
Physical Therapy Treatment Patient Details Name: Jill Hancock MRN: [id number] DOB: 12-Feb-1941 Today's Date: 01/02/2018    History of Present Illness Pt presents to hospital 12/31/17 with chest pain and nausea. Pt diagnosed with multiple bilateral rib fractures and a displaced sternal fracture. Pt states that pain began one week ago [place] Pt has a past medical history that includes a stroke in 2013 , HTN, moderate hiatial hernia, and aortic atherosclerosis.    PT Comments    Pt seen this morning and states that she is doing well. Pt continues to complain of 5/10 pain that eases with repositioning. Pt states that she has been up several times to [facility] with nursing. Pt instructed in LE there-ex and demonstrates good LE strength. Pt able to perform bed mobility mod I with minimal UE support due to pain. Pt min assist sit<>stand and to amb 5'. Pt appears unsteady on feet and states that she feels off balance. Pt states she is bearing less weight through UEs on RW than at baseline. She states she is hesitant to bear weight through UE on RW secondary to pain which is why she feels off balance. At this time pt is drastically limited due to pain and increase in pain when using UEs. Pt could benefit from continued skilled therapy at this time to improve deficits toward PLOF. PT will continue to work with pt 7x/week while admitted. D/c recommendations continue to be SNF.    Follow Up Recommendations  SNF     Equipment Recommendations  None recommended by PT    Recommendations for Other Services       Precautions / Restrictions Precautions Precautions: None Restrictions Weight Bearing Restrictions: No Other Position/Activity Restrictions: up as tolerated    Mobility  Bed Mobility Overal bed mobility: Modified Independent             General bed mobility comments: pt requires increased time to perform. Pt able to perform without physical assistance and  limited UE use due to pain.  Transfers Overall transfer level: Needs assistance Equipment used: Rolling walker (2 wheeled) Transfers: Sit to/from Stand Sit to Stand: Min assist         General transfer comment: pt requires min assist sit<>stand at RW. able to perform with minimal UE support secondary to pain.  Ambulation/Gait Ambulation/Gait assistance: Min assist Gait Distance (Feet): 5 Feet Assistive device: Rolling walker (2 wheeled) Gait Pattern/deviations: Shuffle     General Gait Details: pt amb 5 using RW and min assist. Pt very unsteady on feet. Pt uses RW safely however does not bear much weight through UE secondary to pain. Pt unable to amb further due to unsteadiness and fatigue.   Stairs             Wheelchair Mobility    Modified Rankin (Stroke Patients Only)       Balance                                            Cognition Arousal/Alertness: Awake/alert Behavior During Therapy: WFL for tasks assessed/performed Overall Cognitive Status: Within Functional Limits for tasks assessed                                 General Comments: pt oriented AO x4 at this  session.      Exercises Other Exercises Other Exercises: Pt instructed in B LE exercises x15 with verbal cuing for safety and technique including SLR, resisted hip abd, LAQ, seated marching, resisted knee flexion/extension and standing marching.    General Comments        Pertinent Vitals/Pain Pain Assessment: 0-10 Pain Score: 5  Pain Location: chest Pain Descriptors / Indicators: Aching;Cramping;Sharp Pain Intervention(s): Limited activity within patient's tolerance;Monitored during session;Repositioned    Home Living                      Prior Function            PT Goals (current goals can now be found in the care plan section) Acute Rehab PT Goals Patient Stated Goal: pt states that she wants to get up and move PT Goal Formulation:  With patient Time For Goal Achievement: 01/15/18 Potential to Achieve Goals: Good Progress towards PT goals: Progressing toward goals    Frequency    7X/week      PT Plan Current plan remains appropriate    Co-evaluation              AM-PAC PT "6 Clicks" Daily Activity  Outcome Measure  Difficulty turning over in bed (including adjusting bedclothes, sheets and blankets)?: A Little Difficulty moving from lying on back to sitting on the side of the bed? : A Little Difficulty sitting down on and standing up from a chair with arms (e.g., wheelchair, bedside commode, etc,.)?: Unable Help needed moving to and from a bed to chair (including a wheelchair)?: A Little Help needed walking in hospital room?: A Lot Help needed climbing 3-5 steps with a railing? : A Lot 6 Click Score: 14    End of Session Equipment Utilized During Treatment: Gait belt Activity Tolerance: Patient tolerated treatment well;Patient limited by fatigue;Patient limited by pain Patient left: in chair;with call bell/phone within reach;with chair alarm set;with SCD's reapplied Nurse Communication: Mobility status PT Visit Diagnosis: Unsteadiness on feet (R26.81);Other abnormalities of gait and mobility (R26.89);Repeated falls (R29.6);Muscle weakness (generalized) (M62.81);History of falling (Z91.81);Difficulty in walking, not elsewhere classified (R26.2);Pain Pain - Right/Left: (central chest) Pain - part of body: (chest)     Time: [id number] PT Time Calculation (min) (ACUTE ONLY): 25 min  Charges:                       G Codes:       Jill Hancock, SPT    Tevon Berhane 01/02/2018, 10:56 AM

## 2018-01-02 NOTE — Clinical Social Work Placement (Signed)
   CLINICAL SOCIAL WORK PLACEMENT  NOTE  Date:  01/02/2018  Patient Details  Name: Jill Hancock MRN: [id number] Date of Birth: Jun 16, 1941  Clinical Social Work is seeking post-discharge placement for this patient at the Skilled  Nursing Facility level of care (*CSW will initial, date and re-position this form in  chart as items are completed):  Yes   Patient/family provided with [place] Clinical Social Work Department's list of facilities offering this level of care within the geographic area requested by the patient (or if unable, by the patient's family).  Yes   Patient/family informed of their freedom to choose among providers that offer the needed level of care, that participate in Medicare, Medicaid or managed care program needed by the patient, have an available bed and are willing to accept the patient.  Yes   Patient/family informed of 's ownership interest in [facility] and [facility], as well as of the fact that they are under no obligation to receive care at these facilities.  PASRR submitted to EDS on 01/01/18     PASRR number received on 01/01/18     Existing PASRR number confirmed on       FL2 transmitted to all facilities in geographic area requested by pt/family on 01/01/18     FL2 transmitted to all facilities within larger geographic area on       Patient informed that his/her managed care company has contracts with or will negotiate with certain facilities, including the following:        Yes   Patient/family informed of bed offers received.  Patient chooses bed at ([facility] )     Physician recommends and patient chooses bed at      Patient to be transferred to ([facility] ) on 01/02/18.  Patient to be transferred to facility by [facility] )     Patient family notified on 01/02/18 of transfer.  Name of family member notified:  (Patient's daughter Jill Hancock is aware of D/C today. )     PHYSICIAN       Additional Comment:     _______________________________________________ Joselynne Killam, Darleen CrockerBailey M, LCSW 01/02/2018, 5:10 PM

## 2018-01-06 DIAGNOSIS — S2249XA Multiple fractures of ribs, unspecified side, initial encounter for closed fracture: Secondary | ICD-10-CM | POA: Diagnosis not present

## 2018-01-06 DIAGNOSIS — Z8673 Personal history of transient ischemic attack (TIA), and cerebral infarction without residual deficits: Secondary | ICD-10-CM | POA: Diagnosis not present

## 2018-01-06 DIAGNOSIS — F419 Anxiety disorder, unspecified: Secondary | ICD-10-CM | POA: Diagnosis not present

## 2018-01-06 DIAGNOSIS — E785 Hyperlipidemia, unspecified: Secondary | ICD-10-CM | POA: Diagnosis not present

## 2018-01-06 DIAGNOSIS — I10 Essential (primary) hypertension: Secondary | ICD-10-CM | POA: Diagnosis not present

## 2018-01-06 DIAGNOSIS — S2220XA Unspecified fracture of sternum, initial encounter for closed fracture: Secondary | ICD-10-CM | POA: Diagnosis not present

## 2018-01-06 DIAGNOSIS — K219 Gastro-esophageal reflux disease without esophagitis: Secondary | ICD-10-CM | POA: Diagnosis not present

## 2018-01-16 ENCOUNTER — Telehealth: Payer: Self-pay | Admitting: Family Medicine

## 2018-01-16 ENCOUNTER — Ambulatory Visit: Payer: Self-pay | Admitting: Family Medicine

## 2018-01-16 DIAGNOSIS — Z9181 History of falling: Secondary | ICD-10-CM | POA: Diagnosis not present

## 2018-01-16 DIAGNOSIS — S2243XD Multiple fractures of ribs, bilateral, subsequent encounter for fracture with routine healing: Secondary | ICD-10-CM | POA: Diagnosis not present

## 2018-01-16 DIAGNOSIS — S2222XD Fracture of body of sternum, subsequent encounter for fracture with routine healing: Secondary | ICD-10-CM | POA: Diagnosis not present

## 2018-01-16 DIAGNOSIS — I2510 Atherosclerotic heart disease of native coronary artery without angina pectoris: Secondary | ICD-10-CM | POA: Diagnosis not present

## 2018-01-16 DIAGNOSIS — I10 Essential (primary) hypertension: Secondary | ICD-10-CM | POA: Diagnosis not present

## 2018-01-16 DIAGNOSIS — Z8673 Personal history of transient ischemic attack (TIA), and cerebral infarction without residual deficits: Secondary | ICD-10-CM | POA: Diagnosis not present

## 2018-01-16 DIAGNOSIS — H8149 Vertigo of central origin, unspecified ear: Secondary | ICD-10-CM | POA: Diagnosis not present

## 2018-01-16 DIAGNOSIS — F329 Major depressive disorder, single episode, unspecified: Secondary | ICD-10-CM | POA: Diagnosis not present

## 2018-01-16 DIAGNOSIS — M810 Age-related osteoporosis without current pathological fracture: Secondary | ICD-10-CM | POA: Diagnosis not present

## 2018-01-16 NOTE — Telephone Encounter (Signed)
Misty with [facility] said pt was discharged from [facility] and needs a plan of care.  She needs a verbal for nursing once a week for 4 weeks, then once every other week for 5 weeks and a home health aid twice a week for 4 weeks.  Her call back number is [phone]

## 2018-01-17 ENCOUNTER — Other Ambulatory Visit: Payer: Self-pay

## 2018-01-17 NOTE — Telephone Encounter (Signed)
Misty was notified. No questions or concerns.

## 2018-01-17 NOTE — Patient Outreach (Signed)
[facility]) Care Management  01/17/2018  Jill JarvisLinda S Prieto 06-Feb-1941 [id number]   Referral Date: 01/17/18 Referral Source: [employer] Report Date of Admission:  01/02/18  Diagnosis:  Rib Fractures and fall Date of Discharge: 01/15/18 Facility:  [facility] Insurance: [employer]  Outreach attempt # 1 Telephone call to patient for transition of care call.  No answer.  HIPAA compliant voice message left.    Plan: RN CM will send letter and attempt patient again within 4 business days.     Bary Lericheionne J Mckensie Scotti, RN, MSN [facility] Care Management Care Management Coordinator Direct Line [phone] Toll Free: [phone]  Fax: [phone]

## 2018-01-17 NOTE — Telephone Encounter (Signed)
I would like to provide verbal for authorizing [facility] a week for 4 weeks, then once every other week for 5 weeks and a home health aid twice a week for 4 weeks.  If you can call Misty back to confirm.  Thanks  Saralyn PilarAlexander Karamalegos, DO [facility] 01/17/2018, 9:04 AM

## 2018-01-18 ENCOUNTER — Other Ambulatory Visit: Payer: Self-pay

## 2018-01-18 NOTE — Patient Outreach (Signed)
[facility]) Care Management  01/18/2018  Jill JarvisLinda S Hancock June 09, 1941 [id number]  Referral Date: 01/17/18 Referral Source: [employer] Report Date of Admission:  01/02/18      Diagnosis:  Rib Fractures and fall Date of Discharge: 01/15/18 Facility:  [facility] Insurance: [employer]  Outreach attempt # 2 spoke with daughter Jill Hancock.  She states she is not with patient has asked that I call her sister Jill Hancock at [phone].   Spoke with daughter Jill Hancock. She is able to verify HIPAA.  She states that [facility].  She states that patient has [facility] in to seeing her.  She states that nurse social worker, PT and ST to see patient.  She states that the nurse came the other day and will be coming once a week.    Social: Patient lives alone in independent senior apartment.  Daughter states she does need some assistance with bathing but can dress her self.  Daughter bring her meals but Jill Hancock states that patient is able to fic simple things.    Conditions: Patient hospitalized recently after a fall. Patient went to SNF for rehab and has returned home to her independent apartment.  Patient with rib fractures from her falls.  Patient also has some dementia and HTN.    Medications: Patient takes medications as prescribed. Daughter Jill Hancock states that fixed patient an electronic pill box and patient takes medications on her own.      Appointments: Patient to see PCP on Monday for follow up and one of her daughters will take her.    Consent: RN CM [facility] with [facility].  Daughter declined need for services right now.  Plan: RN CM will send letter and close case.     Bary Lericheionne J Shaunie Boehm, RN, MSN [facility] Care Management Care Management Coordinator Direct Line [phone] Toll Free: [phone]  Fax: [phone]

## 2018-01-21 ENCOUNTER — Ambulatory Visit (INDEPENDENT_AMBULATORY_CARE_PROVIDER_SITE_OTHER): Payer: Medicare HMO | Admitting: Family Medicine

## 2018-01-21 VITALS — BP 102/60 | HR 61 | Resp 15 | Ht 62.0 in | Wt 156.0 lb

## 2018-01-21 DIAGNOSIS — Z9181 History of falling: Secondary | ICD-10-CM | POA: Diagnosis not present

## 2018-01-21 DIAGNOSIS — F339 Major depressive disorder, recurrent, unspecified: Secondary | ICD-10-CM | POA: Diagnosis not present

## 2018-01-21 DIAGNOSIS — M810 Age-related osteoporosis without current pathological fracture: Secondary | ICD-10-CM | POA: Diagnosis not present

## 2018-01-21 DIAGNOSIS — F01518 Vascular dementia, unspecified severity, with other behavioral disturbance: Secondary | ICD-10-CM

## 2018-01-21 DIAGNOSIS — I10 Essential (primary) hypertension: Secondary | ICD-10-CM | POA: Diagnosis not present

## 2018-01-21 DIAGNOSIS — F0151 Vascular dementia with behavioral disturbance: Secondary | ICD-10-CM

## 2018-01-21 DIAGNOSIS — Z8673 Personal history of transient ischemic attack (TIA), and cerebral infarction without residual deficits: Secondary | ICD-10-CM | POA: Diagnosis not present

## 2018-01-21 DIAGNOSIS — S2222XD Fracture of body of sternum, subsequent encounter for fracture with routine healing: Secondary | ICD-10-CM | POA: Diagnosis not present

## 2018-01-21 DIAGNOSIS — F329 Major depressive disorder, single episode, unspecified: Secondary | ICD-10-CM | POA: Diagnosis not present

## 2018-01-21 DIAGNOSIS — I2510 Atherosclerotic heart disease of native coronary artery without angina pectoris: Secondary | ICD-10-CM | POA: Diagnosis not present

## 2018-01-21 DIAGNOSIS — S2243XA Multiple fractures of ribs, bilateral, initial encounter for closed fracture: Secondary | ICD-10-CM

## 2018-01-21 DIAGNOSIS — S2243XD Multiple fractures of ribs, bilateral, subsequent encounter for fracture with routine healing: Secondary | ICD-10-CM | POA: Diagnosis not present

## 2018-01-21 DIAGNOSIS — H8149 Vertigo of central origin, unspecified ear: Secondary | ICD-10-CM | POA: Diagnosis not present

## 2018-01-21 MED ORDER — TRAMADOL HCL 50 MG PO TABS: 50.0000 mg | ORAL_TABLET | Freq: Three times a day (TID) | ORAL | 0 refills | Status: DC | PRN

## 2018-01-21 MED ORDER — FLUOXETINE HCL 40 MG PO CAPS: 40.0000 mg | ORAL_CAPSULE | Freq: Every day | ORAL | 2 refills | Status: DC

## 2018-01-21 NOTE — Progress Notes (Signed)
Subjective:    Patient ID: Jill Hancock, female    DOB: January 28, 1941, 77 y.o.   MRN: [id number]  Jill Hancock is a 77 y.o. female presenting on 01/21/2018 for Depression and Hypertension  History primarily provided by daughter, [name], also accompanied is granddaughter. Patient only provides limited history with dementia.  HPI   HOSPITAL FOLLOW-UP VISIT  Hospital/Location: [facility] Date of Admission: 12/31/17 Date of Discharge: 01/02/18  Admitted to [facility] on 01/02/18 Discharged from SNF: on 01/15/18 to Home  Transitions of care telephone call: completed 01/18/18 by Fleeta Emmer RN [facility]  Reason for Admission: Pain / Fractures after Fall Primary (+Secondary) Diagnosis: Closed fracture anterior mid sternum and multiple rib fractures  FOLLOW-UP  - Hospital H&P and Discharge Summary have been reviewed - Patient presents today about 19 days after recent hospitalization. Brief summary of recent course was reviewed including H&P and SNF evaluation. X-ray confirmed fractures, treated with pain management with scheduled Tylenol and PRN Tramadol and Incentive spirometer. Discharged to SNF and then [facility] PT/RN/SW on discharge.  - Today reports overall has done well after discharge. Symptoms of chest wall pain have improved but still present, sometimes worse at night when trying to get comfortable, was still taking Tramadol until finished, now taking Tylenol Arthritis Strength 690m BID with some relief. She was taking Tramadol q 6 hr PRN with good results.  Additionally has a few remaining vicodain from dentist. No longer taking. Also has some Xanax rx by SNF that she has never started.  - New medications on discharge: Tramadol - Changes to current meds on discharge: None  Follow[facility] by [facility]) RN and Social Work. They have not arranged PT yet this is to be determined, should be arranged this week. She qualified for several meals through insurance including Meals and  Wheels.  Additional background history reviewed with daughter, caregiver today - Note previous caregiver daughter [name] is no longer involved primarily in care, now [place] and Zella Ball are primarily caring for her now.  CHRONIC HTN: Outside BP readings lower than average. Asking about Carvedilol she took this as well recently despite previous DC Current Meds - Lisinopril 40mg  daily, Amlodipine 5mg  daily   Reports good compliance, took meds today. Tolerating well, w/o complaints.  Chronic R Knee Pain / Osteoarthritis R knee, s/p prior surgery Followed by [facility] for OA/DJD multiple joints, primarily R knee is main problem, has received multiple cortisone injections and also synvisc type injection x 2 out of 3 now, waiting on last dose, was told may take few weeks to take full effect. - No recent imaging on file. Or chart from ortho in scanned chart. - She has R knee pain every day, worse with excess activity - She does not take Tylenol regularly - Tried ice packs, heating pad, knee sleeve  PMH: - Vascular Dementia: reviewed history of CVA and secondary affect on cognition. Previously followed by Neurology, she has some behavioral episodes. - Depression - on Prozac 20mg  daily for many years 6-8, question today if she needs to continue or if needs dose change. Seems less effective now.  I have reviewed the discharge medication list, and have reconciled the current and discharge medications today.   Depression screen [facility] 2/9 01/21/2018 12/05/2017 07/18/2016  Decreased Interest 2 3 0  Down, Depressed, Hopeless 0 3 1  PHQ - 2 Score 2 6 1   Altered sleeping 0 3 -  Tired, decreased energy 2 2 -  Change in appetite  1 2 -  Feeling bad or failure about yourself  3 3 -  Trouble concentrating 0 3 -  Moving slowly or fidgety/restless 0 2 -  Suicidal thoughts 0 0 -  PHQ-9 Score 8 21 -  Difficult doing work/chores Not difficult at all Very difficult -  Some recent data might be hidden   No  flowsheet data found.   Social History   Tobacco Use  . Smoking status: Never Smoker  . Smokeless tobacco: Never Used  Substance Use Topics  . Alcohol use: No    Alcohol/week: 0.0 oz  . Drug use: No    Review of Systems Per HPI unless specifically indicated above     Objective:    BP 102/60 (BP Location: Left Arm, Patient Position: Sitting, Cuff Size: Normal)   Pulse 61   Resp 15   Ht 5\' 2"  (1.575 m)   Wt 156 lb (70.8 kg)   BMI 28.53 kg/m   Wt Readings from Last 3 Encounters:  01/21/18 156 lb (70.8 kg)  12/31/17 151 lb (68.5 kg)  12/14/17 156 lb (70.8 kg)    Physical Exam  Constitutional: She is oriented to person, place, and time. She appears well-developed and well-nourished. No distress.  Well-appearing 77 year old female, comfortable, cooperative  HENT:  Head: Normocephalic and atraumatic.  Mouth/Throat: Oropharynx is clear and moist.  Eyes: Conjunctivae are normal. Right eye exhibits no discharge. Left eye exhibits no discharge.  Cardiovascular: Normal rate and intact distal pulses.  Pulmonary/Chest: Effort normal.  Abdominal: Soft. She exhibits no distension. There is no tenderness.  Musculoskeletal: She exhibits no edema (only trace lower extremity edema, non pitting).  Requires assistance to stand and ambulate, does not have cane or walker present today  Neurological: She is alert and oriented to person, place, and time.  Skin: Skin is warm and dry. No rash noted. She is not diaphoretic. No erythema.  Varicose veins bilateral lower extremity  Psychiatric: She has a normal mood and affect. Her behavior is normal.  Well groomed, good eye contact. Some reduced speech amount and frequently looks to her daughter to answer questions, but when provides responses she has appropriate speech and normal thoughts. Occasionally difficult word finding or recall otherwise has intact memory  Nursing note and vitals reviewed.  Results for orders placed or performed during  the hospital encounter of 12/31/17  Basic metabolic panel  Result Value Ref Range   Sodium 137 135 - 145 mmol/L   Potassium 3.3 (L) 3.5 - 5.1 mmol/L   Chloride 101 98 - 111 mmol/L   CO2 23 22 - 32 mmol/L   Glucose, Bld 136 (H) 70 - 99 mg/dL   BUN 14 8 - 23 mg/dL   Creatinine, Ser 5.780.86 0.44 - 1.00 mg/dL   Calcium 8.8 (L) 8.9 - 10.3 mg/dL   GFR calc non Af Amer >60 >60 mL/min   GFR calc Af Amer >60 >60 mL/min   Anion gap 13 5 - 15  CBC  Result Value Ref Range   WBC 10.2 3.6 - 11.0 K/uL   RBC 4.29 3.80 - 5.20 MIL/uL   Hemoglobin 12.8 12.0 - 16.0 g/dL   HCT 46.937.7 62.935.0 - 52.847.0 %   MCV 87.9 80.0 - 100.0 fL   MCH 29.9 26.0 - 34.0 pg   MCHC 34.0 32.0 - 36.0 g/dL   RDW 41.314.4 24.411.5 - 01.014.5 %   Platelets 367 150 - 440 K/uL  Troponin I  Result Value Ref Range  Troponin I <0.03 <0.03 ng/mL  Basic metabolic panel  Result Value Ref Range   Sodium 140 135 - 145 mmol/L   Potassium 3.5 3.5 - 5.1 mmol/L   Chloride 103 98 - 111 mmol/L   CO2 30 22 - 32 mmol/L   Glucose, Bld 112 (H) 70 - 99 mg/dL   BUN 9 8 - 23 mg/dL   Creatinine, Ser 8.11 0.44 - 1.00 mg/dL   Calcium 8.4 (L) 8.9 - 10.3 mg/dL   GFR calc non Af Amer >60 >60 mL/min   GFR calc Af Amer >60 >60 mL/min   Anion gap 7 5 - 15  CBC  Result Value Ref Range   WBC 7.5 3.6 - 11.0 K/uL   RBC 4.12 3.80 - 5.20 MIL/uL   Hemoglobin 12.2 12.0 - 16.0 g/dL   HCT 91.4 78.2 - 95.6 %   MCV 88.0 80.0 - 100.0 fL   MCH 29.7 26.0 - 34.0 pg   MCHC 33.7 32.0 - 36.0 g/dL   RDW 21.3 08.6 - 57.8 %   Platelets 352 150 - 440 K/uL      Assessment & Plan:   Problem List Items Addressed This Visit    Dementia, vascular Stable chronic problem Secondary to CVAs in past Reviewed problem with patient and caregiver Continue HH PT RN SW - future may need placement    Relevant Medications   FLUoxetine (PROZAC) 40 MG capsule   Hypertension (Chronic) Advised should be discontinued off Carvedilol Continue Amlodipine 10 and Lisinporil 40 for now    Major  depression, recurrent, chronic (HCC) Chronic problem, relatively stable with improved PHQ Chronic use of Prozac 20mg  daily - given some behavioral symptoms concern with ineffective Will increase dose to Prozac 40mg  daily now to see trial if improved otherwise consider Zoloft as alternative    Relevant Medications   FLUoxetine (PROZAC) 40 MG capsule   Multiple rib fractures - Primary Gradually improving, secondary to fall Refill short term Tramadol for #5 day supply for now- in future may need more chronic tramadol for chronic knee pain Tylenol dosing as advised    Relevant Medications   traMADol (ULTRAM) 50 MG tablet      Meds ordered this encounter  Medications  . traMADol (ULTRAM) 50 MG tablet    Sig: Take 1 tablet (50 mg total) by mouth every 8 (eight) hours as needed for moderate pain (rib fracture).    Dispense:  15 tablet    Refill:  0  . FLUoxetine (PROZAC) 40 MG capsule    Sig: Take 1 capsule (40 mg total) by mouth daily.    Dispense:  30 capsule    Refill:  2    Dose increase from Prozac 20 up to 40mg  daily      Follow up plan: Return in about 6 weeks (around 03/04/2018) for 6 week follow-up Depression/PHQ med adjust, HTN, [facility] HH status, rib pain.  Saralyn Pilar, DO [facility] 01/21/2018, 9:17 PM

## 2018-01-21 NOTE — Patient Instructions (Addendum)
Thank you for coming to the office today.  Dispose of the Oxycodone rx - from dentist - this should be taken to Police Dept to properly safely dispose of  Start back on Tylenol  Recommend to start taking Tylenol Extra Strength 500mg  tabs - take 1 to 2 tabs per dose (max 1000mg ) every 6-8 hours for pain (take regularly, don't skip a dose for next 7 days), max 24 hour daily dose is 6 tablets or 3000mg . In the future you can repeat the same everyday Tylenol course for 1-2 weeks at a time.   Will also provide temporary tramadol as needed for pain.  Take Tramadol one pill nightly as needed for pain - only 5 day supply given  History of Vascular Dementia - this can be cause of your cognitive decline and memory are affected  ----------------------------------------  Cancel Carvedilol - BP pill no longer needed  Continue Lisinopril 40mg  and Amlodipine 10mg  daily  Increased Fluoxetine (Prozac) from 20mg  up to 40mg  - take two pills and we have sent a new rx of this for future to try.  If significantly improved or nothing else needed by 6 week apt - can call and re-scheduled for 6 more weeks  Please schedule a Follow-up Appointment to: Return in about 6 weeks (around 03/04/2018) for 6 week follow-up Depression/PHQ med adjust, HTN, [facility] HH status, rib pain.  If you have any other questions or concerns, please feel free to call the office or send a message through MyChart. You may also schedule an earlier appointment if necessary.  Additionally, you may be receiving a survey about your experience at our office within a few days to 1 week by e-mail or mail. We value your feedback.  Saralyn PilarAlexander Vernadine Coombs, DO [facility]

## 2018-01-22 ENCOUNTER — Encounter: Payer: Self-pay | Admitting: Family Medicine

## 2018-01-22 DIAGNOSIS — I10 Essential (primary) hypertension: Secondary | ICD-10-CM | POA: Diagnosis not present

## 2018-01-22 DIAGNOSIS — S2243XD Multiple fractures of ribs, bilateral, subsequent encounter for fracture with routine healing: Secondary | ICD-10-CM | POA: Diagnosis not present

## 2018-01-22 DIAGNOSIS — H8149 Vertigo of central origin, unspecified ear: Secondary | ICD-10-CM | POA: Diagnosis not present

## 2018-01-22 DIAGNOSIS — M810 Age-related osteoporosis without current pathological fracture: Secondary | ICD-10-CM | POA: Diagnosis not present

## 2018-01-22 DIAGNOSIS — F329 Major depressive disorder, single episode, unspecified: Secondary | ICD-10-CM | POA: Diagnosis not present

## 2018-01-22 DIAGNOSIS — Z8673 Personal history of transient ischemic attack (TIA), and cerebral infarction without residual deficits: Secondary | ICD-10-CM | POA: Diagnosis not present

## 2018-01-22 DIAGNOSIS — I2510 Atherosclerotic heart disease of native coronary artery without angina pectoris: Secondary | ICD-10-CM | POA: Diagnosis not present

## 2018-01-22 DIAGNOSIS — S2222XD Fracture of body of sternum, subsequent encounter for fracture with routine healing: Secondary | ICD-10-CM | POA: Diagnosis not present

## 2018-01-22 DIAGNOSIS — Z9181 History of falling: Secondary | ICD-10-CM | POA: Diagnosis not present

## 2018-01-24 DIAGNOSIS — Z9181 History of falling: Secondary | ICD-10-CM | POA: Diagnosis not present

## 2018-01-24 DIAGNOSIS — H8149 Vertigo of central origin, unspecified ear: Secondary | ICD-10-CM | POA: Diagnosis not present

## 2018-01-24 DIAGNOSIS — I10 Essential (primary) hypertension: Secondary | ICD-10-CM | POA: Diagnosis not present

## 2018-01-24 DIAGNOSIS — S2243XD Multiple fractures of ribs, bilateral, subsequent encounter for fracture with routine healing: Secondary | ICD-10-CM | POA: Diagnosis not present

## 2018-01-24 DIAGNOSIS — I2510 Atherosclerotic heart disease of native coronary artery without angina pectoris: Secondary | ICD-10-CM | POA: Diagnosis not present

## 2018-01-24 DIAGNOSIS — F329 Major depressive disorder, single episode, unspecified: Secondary | ICD-10-CM | POA: Diagnosis not present

## 2018-01-24 DIAGNOSIS — S2222XD Fracture of body of sternum, subsequent encounter for fracture with routine healing: Secondary | ICD-10-CM | POA: Diagnosis not present

## 2018-01-24 DIAGNOSIS — M810 Age-related osteoporosis without current pathological fracture: Secondary | ICD-10-CM | POA: Diagnosis not present

## 2018-01-24 DIAGNOSIS — Z8673 Personal history of transient ischemic attack (TIA), and cerebral infarction without residual deficits: Secondary | ICD-10-CM | POA: Diagnosis not present

## 2018-01-28 DIAGNOSIS — S2243XD Multiple fractures of ribs, bilateral, subsequent encounter for fracture with routine healing: Secondary | ICD-10-CM | POA: Diagnosis not present

## 2018-01-28 DIAGNOSIS — Z8673 Personal history of transient ischemic attack (TIA), and cerebral infarction without residual deficits: Secondary | ICD-10-CM | POA: Diagnosis not present

## 2018-01-28 DIAGNOSIS — I10 Essential (primary) hypertension: Secondary | ICD-10-CM | POA: Diagnosis not present

## 2018-01-28 DIAGNOSIS — H8149 Vertigo of central origin, unspecified ear: Secondary | ICD-10-CM | POA: Diagnosis not present

## 2018-01-28 DIAGNOSIS — I2510 Atherosclerotic heart disease of native coronary artery without angina pectoris: Secondary | ICD-10-CM | POA: Diagnosis not present

## 2018-01-28 DIAGNOSIS — M810 Age-related osteoporosis without current pathological fracture: Secondary | ICD-10-CM | POA: Diagnosis not present

## 2018-01-28 DIAGNOSIS — F329 Major depressive disorder, single episode, unspecified: Secondary | ICD-10-CM | POA: Diagnosis not present

## 2018-01-28 DIAGNOSIS — S2222XD Fracture of body of sternum, subsequent encounter for fracture with routine healing: Secondary | ICD-10-CM | POA: Diagnosis not present

## 2018-01-28 DIAGNOSIS — Z9181 History of falling: Secondary | ICD-10-CM | POA: Diagnosis not present

## 2018-01-29 ENCOUNTER — Telehealth: Payer: Self-pay

## 2018-01-29 DIAGNOSIS — H8149 Vertigo of central origin, unspecified ear: Secondary | ICD-10-CM | POA: Diagnosis not present

## 2018-01-29 DIAGNOSIS — Z9181 History of falling: Secondary | ICD-10-CM | POA: Diagnosis not present

## 2018-01-29 DIAGNOSIS — I10 Essential (primary) hypertension: Secondary | ICD-10-CM | POA: Diagnosis not present

## 2018-01-29 DIAGNOSIS — S2222XD Fracture of body of sternum, subsequent encounter for fracture with routine healing: Secondary | ICD-10-CM | POA: Diagnosis not present

## 2018-01-29 DIAGNOSIS — F329 Major depressive disorder, single episode, unspecified: Secondary | ICD-10-CM | POA: Diagnosis not present

## 2018-01-29 DIAGNOSIS — M810 Age-related osteoporosis without current pathological fracture: Secondary | ICD-10-CM | POA: Diagnosis not present

## 2018-01-29 DIAGNOSIS — I2510 Atherosclerotic heart disease of native coronary artery without angina pectoris: Secondary | ICD-10-CM | POA: Diagnosis not present

## 2018-01-29 DIAGNOSIS — S2243XD Multiple fractures of ribs, bilateral, subsequent encounter for fracture with routine healing: Secondary | ICD-10-CM | POA: Diagnosis not present

## 2018-01-29 DIAGNOSIS — Z8673 Personal history of transient ischemic attack (TIA), and cerebral infarction without residual deficits: Secondary | ICD-10-CM | POA: Diagnosis not present

## 2018-01-29 NOTE — Telephone Encounter (Signed)
Please call patient's family back and let them know that I do not have an exact answer as to why she is having recurrent UTI.  The most common cause would be poor bladder emptying and some bladder dysfunction, causing build up of bacteria and recurrent infection.  I reviewed her chart, and it shows back in 2015 her previous doctor tried referring her to  Urologist to do some testing for her bladder to determine this cause, but I do not see that it was ever completed.  I can place referral for her to see a local Urologist [facility] if she is interested to have more evaluation of these infections.  Please let me know if she would like to proceed with this referral.  Saralyn PilarAlexander Karamalegos, DO [facility] 01/29/2018, 6:31 PM

## 2018-01-29 NOTE — Telephone Encounter (Signed)
The pt daughter Dois Davenport(Sandra) came in the office requesting to pick up a cup for us to check her mothers urine. She was informed per office policy we don't take drop off urine.The daughter states that the patient was recently treated for a UTI and states her symptoms did not completely resolve. The daughter states she keeps having recurrent UTI and would like to know why. She complaining of dysuria, urinary frequency over the past 2-3 days.   I also received a phone call from Elmer BalesEmily Parker at [facility] requesting a verbal order for the nurse to come out to the house for Urinalysis. Irving Burtonmily states you can leave the order on the voicemail if she doesn't answer.    Irving Burtonmily [phone]

## 2018-01-30 DIAGNOSIS — Z8673 Personal history of transient ischemic attack (TIA), and cerebral infarction without residual deficits: Secondary | ICD-10-CM | POA: Diagnosis not present

## 2018-01-30 DIAGNOSIS — I10 Essential (primary) hypertension: Secondary | ICD-10-CM | POA: Diagnosis not present

## 2018-01-30 DIAGNOSIS — Z9181 History of falling: Secondary | ICD-10-CM | POA: Diagnosis not present

## 2018-01-30 DIAGNOSIS — S2243XD Multiple fractures of ribs, bilateral, subsequent encounter for fracture with routine healing: Secondary | ICD-10-CM | POA: Diagnosis not present

## 2018-01-30 DIAGNOSIS — S2222XD Fracture of body of sternum, subsequent encounter for fracture with routine healing: Secondary | ICD-10-CM | POA: Diagnosis not present

## 2018-01-30 DIAGNOSIS — F329 Major depressive disorder, single episode, unspecified: Secondary | ICD-10-CM | POA: Diagnosis not present

## 2018-01-30 DIAGNOSIS — I2510 Atherosclerotic heart disease of native coronary artery without angina pectoris: Secondary | ICD-10-CM | POA: Diagnosis not present

## 2018-01-30 DIAGNOSIS — H8149 Vertigo of central origin, unspecified ear: Secondary | ICD-10-CM | POA: Diagnosis not present

## 2018-01-30 DIAGNOSIS — M810 Age-related osteoporosis without current pathological fracture: Secondary | ICD-10-CM | POA: Diagnosis not present

## 2018-01-30 NOTE — Telephone Encounter (Signed)
Verbal given to Elmer BalesEmily Parker at [facility] for U/A and urine culture and she will notify patient's daughter about urology referral.

## 2018-01-31 DIAGNOSIS — I10 Essential (primary) hypertension: Secondary | ICD-10-CM | POA: Diagnosis not present

## 2018-01-31 DIAGNOSIS — S2243XD Multiple fractures of ribs, bilateral, subsequent encounter for fracture with routine healing: Secondary | ICD-10-CM | POA: Diagnosis not present

## 2018-01-31 DIAGNOSIS — H8149 Vertigo of central origin, unspecified ear: Secondary | ICD-10-CM | POA: Diagnosis not present

## 2018-01-31 DIAGNOSIS — N399 Disorder of urinary system, unspecified: Secondary | ICD-10-CM | POA: Diagnosis not present

## 2018-01-31 DIAGNOSIS — S2222XD Fracture of body of sternum, subsequent encounter for fracture with routine healing: Secondary | ICD-10-CM | POA: Diagnosis not present

## 2018-01-31 DIAGNOSIS — I2510 Atherosclerotic heart disease of native coronary artery without angina pectoris: Secondary | ICD-10-CM | POA: Diagnosis not present

## 2018-01-31 DIAGNOSIS — Z9181 History of falling: Secondary | ICD-10-CM | POA: Diagnosis not present

## 2018-01-31 DIAGNOSIS — F329 Major depressive disorder, single episode, unspecified: Secondary | ICD-10-CM | POA: Diagnosis not present

## 2018-01-31 DIAGNOSIS — M810 Age-related osteoporosis without current pathological fracture: Secondary | ICD-10-CM | POA: Diagnosis not present

## 2018-01-31 DIAGNOSIS — Z8673 Personal history of transient ischemic attack (TIA), and cerebral infarction without residual deficits: Secondary | ICD-10-CM | POA: Diagnosis not present

## 2018-02-01 DIAGNOSIS — I10 Essential (primary) hypertension: Secondary | ICD-10-CM | POA: Diagnosis not present

## 2018-02-01 DIAGNOSIS — S2222XD Fracture of body of sternum, subsequent encounter for fracture with routine healing: Secondary | ICD-10-CM | POA: Diagnosis not present

## 2018-02-01 DIAGNOSIS — Z8673 Personal history of transient ischemic attack (TIA), and cerebral infarction without residual deficits: Secondary | ICD-10-CM | POA: Diagnosis not present

## 2018-02-01 DIAGNOSIS — H8149 Vertigo of central origin, unspecified ear: Secondary | ICD-10-CM | POA: Diagnosis not present

## 2018-02-01 DIAGNOSIS — Z9181 History of falling: Secondary | ICD-10-CM | POA: Diagnosis not present

## 2018-02-01 DIAGNOSIS — F329 Major depressive disorder, single episode, unspecified: Secondary | ICD-10-CM | POA: Diagnosis not present

## 2018-02-01 DIAGNOSIS — I2510 Atherosclerotic heart disease of native coronary artery without angina pectoris: Secondary | ICD-10-CM | POA: Diagnosis not present

## 2018-02-01 DIAGNOSIS — M810 Age-related osteoporosis without current pathological fracture: Secondary | ICD-10-CM | POA: Diagnosis not present

## 2018-02-01 DIAGNOSIS — S2243XD Multiple fractures of ribs, bilateral, subsequent encounter for fracture with routine healing: Secondary | ICD-10-CM | POA: Diagnosis not present

## 2018-02-04 DIAGNOSIS — S2243XD Multiple fractures of ribs, bilateral, subsequent encounter for fracture with routine healing: Secondary | ICD-10-CM | POA: Diagnosis not present

## 2018-02-04 DIAGNOSIS — F329 Major depressive disorder, single episode, unspecified: Secondary | ICD-10-CM | POA: Diagnosis not present

## 2018-02-04 DIAGNOSIS — M810 Age-related osteoporosis without current pathological fracture: Secondary | ICD-10-CM | POA: Diagnosis not present

## 2018-02-04 DIAGNOSIS — S2222XD Fracture of body of sternum, subsequent encounter for fracture with routine healing: Secondary | ICD-10-CM | POA: Diagnosis not present

## 2018-02-04 DIAGNOSIS — I10 Essential (primary) hypertension: Secondary | ICD-10-CM | POA: Diagnosis not present

## 2018-02-04 DIAGNOSIS — H8149 Vertigo of central origin, unspecified ear: Secondary | ICD-10-CM | POA: Diagnosis not present

## 2018-02-04 DIAGNOSIS — Z9181 History of falling: Secondary | ICD-10-CM | POA: Diagnosis not present

## 2018-02-04 DIAGNOSIS — I2510 Atherosclerotic heart disease of native coronary artery without angina pectoris: Secondary | ICD-10-CM | POA: Diagnosis not present

## 2018-02-04 DIAGNOSIS — Z8673 Personal history of transient ischemic attack (TIA), and cerebral infarction without residual deficits: Secondary | ICD-10-CM | POA: Diagnosis not present

## 2018-02-04 LAB — URINE CULTURE

## 2018-02-04 LAB — URINALYSIS
BLOOD UA: NEGATIVE
Bilirubin, UA: NEGATIVE — AB
GLUCOSE, UA: NEGATIVE — AB
KETONES UA: NEGATIVE
Protein, UA: NEGATIVE — AB
Specific Gravity, UA: 1.019
Urobilinogen, UA: NORMAL
pH, UA: 6.5 (ref 4.5–8.0)

## 2018-02-05 ENCOUNTER — Telehealth: Payer: Self-pay | Admitting: Family Medicine

## 2018-02-05 ENCOUNTER — Encounter: Payer: Self-pay | Admitting: Family Medicine

## 2018-02-05 DIAGNOSIS — S2222XD Fracture of body of sternum, subsequent encounter for fracture with routine healing: Secondary | ICD-10-CM | POA: Diagnosis not present

## 2018-02-05 DIAGNOSIS — I10 Essential (primary) hypertension: Secondary | ICD-10-CM | POA: Diagnosis not present

## 2018-02-05 DIAGNOSIS — I2510 Atherosclerotic heart disease of native coronary artery without angina pectoris: Secondary | ICD-10-CM | POA: Diagnosis not present

## 2018-02-05 DIAGNOSIS — F329 Major depressive disorder, single episode, unspecified: Secondary | ICD-10-CM | POA: Diagnosis not present

## 2018-02-05 DIAGNOSIS — M810 Age-related osteoporosis without current pathological fracture: Secondary | ICD-10-CM | POA: Diagnosis not present

## 2018-02-05 DIAGNOSIS — S2243XD Multiple fractures of ribs, bilateral, subsequent encounter for fracture with routine healing: Secondary | ICD-10-CM | POA: Diagnosis not present

## 2018-02-05 DIAGNOSIS — Z8673 Personal history of transient ischemic attack (TIA), and cerebral infarction without residual deficits: Secondary | ICD-10-CM | POA: Diagnosis not present

## 2018-02-05 DIAGNOSIS — Z9181 History of falling: Secondary | ICD-10-CM | POA: Diagnosis not present

## 2018-02-05 DIAGNOSIS — H8149 Vertigo of central origin, unspecified ear: Secondary | ICD-10-CM | POA: Diagnosis not present

## 2018-02-05 DIAGNOSIS — N390 Urinary tract infection, site not specified: Secondary | ICD-10-CM

## 2018-02-05 MED ORDER — CIPROFLOXACIN HCL 500 MG PO TABS: 500.0000 mg | ORAL_TABLET | Freq: Two times a day (BID) | ORAL | 0 refills | Status: DC

## 2018-02-05 NOTE — Telephone Encounter (Signed)
Reviewd results from UA and Urine culture [facility] due to patients symptoms of UTI. It showed abnormal UA consistent with UTI and confirmed on culture with Pseudomonas Aeruginosa again, similar to last urine culture but she was asymptomatic at that time. Prior treatment with keflex and CTX. Now given resistances and sensitivity will switch to Cipro course 500 BID for 7 days, [facility] also given recurrent UTI - will place referral to local Urology [facility].  Called patient's daughter, [name] and reviewed above information. Note initially only contact # we had was for Lawson FiscalLori who is no longer involved in patient's care, she gave me other daughter contact info. Attempted to reach preference - Zella BallRobin - unable to reach her.  Saralyn PilarAlexander Rhonda Vangieson, DO [facility] 02/05/2018, 9:06 AM

## 2018-02-06 DIAGNOSIS — F329 Major depressive disorder, single episode, unspecified: Secondary | ICD-10-CM | POA: Diagnosis not present

## 2018-02-06 DIAGNOSIS — S2243XD Multiple fractures of ribs, bilateral, subsequent encounter for fracture with routine healing: Secondary | ICD-10-CM | POA: Diagnosis not present

## 2018-02-06 DIAGNOSIS — Z8673 Personal history of transient ischemic attack (TIA), and cerebral infarction without residual deficits: Secondary | ICD-10-CM | POA: Diagnosis not present

## 2018-02-06 DIAGNOSIS — H8149 Vertigo of central origin, unspecified ear: Secondary | ICD-10-CM | POA: Diagnosis not present

## 2018-02-06 DIAGNOSIS — S2222XD Fracture of body of sternum, subsequent encounter for fracture with routine healing: Secondary | ICD-10-CM | POA: Diagnosis not present

## 2018-02-06 DIAGNOSIS — M810 Age-related osteoporosis without current pathological fracture: Secondary | ICD-10-CM | POA: Diagnosis not present

## 2018-02-06 DIAGNOSIS — I10 Essential (primary) hypertension: Secondary | ICD-10-CM | POA: Diagnosis not present

## 2018-02-06 DIAGNOSIS — Z9181 History of falling: Secondary | ICD-10-CM | POA: Diagnosis not present

## 2018-02-06 DIAGNOSIS — I2510 Atherosclerotic heart disease of native coronary artery without angina pectoris: Secondary | ICD-10-CM | POA: Diagnosis not present

## 2018-02-07 DIAGNOSIS — Z8673 Personal history of transient ischemic attack (TIA), and cerebral infarction without residual deficits: Secondary | ICD-10-CM | POA: Diagnosis not present

## 2018-02-07 DIAGNOSIS — S2243XD Multiple fractures of ribs, bilateral, subsequent encounter for fracture with routine healing: Secondary | ICD-10-CM | POA: Diagnosis not present

## 2018-02-07 DIAGNOSIS — I10 Essential (primary) hypertension: Secondary | ICD-10-CM | POA: Diagnosis not present

## 2018-02-07 DIAGNOSIS — H8149 Vertigo of central origin, unspecified ear: Secondary | ICD-10-CM | POA: Diagnosis not present

## 2018-02-07 DIAGNOSIS — F329 Major depressive disorder, single episode, unspecified: Secondary | ICD-10-CM | POA: Diagnosis not present

## 2018-02-07 DIAGNOSIS — I2510 Atherosclerotic heart disease of native coronary artery without angina pectoris: Secondary | ICD-10-CM | POA: Diagnosis not present

## 2018-02-07 DIAGNOSIS — S2222XD Fracture of body of sternum, subsequent encounter for fracture with routine healing: Secondary | ICD-10-CM | POA: Diagnosis not present

## 2018-02-07 DIAGNOSIS — M810 Age-related osteoporosis without current pathological fracture: Secondary | ICD-10-CM | POA: Diagnosis not present

## 2018-02-07 DIAGNOSIS — Z9181 History of falling: Secondary | ICD-10-CM | POA: Diagnosis not present

## 2018-02-08 DIAGNOSIS — F329 Major depressive disorder, single episode, unspecified: Secondary | ICD-10-CM | POA: Diagnosis not present

## 2018-02-08 DIAGNOSIS — M810 Age-related osteoporosis without current pathological fracture: Secondary | ICD-10-CM | POA: Diagnosis not present

## 2018-02-08 DIAGNOSIS — I2510 Atherosclerotic heart disease of native coronary artery without angina pectoris: Secondary | ICD-10-CM | POA: Diagnosis not present

## 2018-02-08 DIAGNOSIS — S2222XD Fracture of body of sternum, subsequent encounter for fracture with routine healing: Secondary | ICD-10-CM | POA: Diagnosis not present

## 2018-02-08 DIAGNOSIS — Z8673 Personal history of transient ischemic attack (TIA), and cerebral infarction without residual deficits: Secondary | ICD-10-CM | POA: Diagnosis not present

## 2018-02-08 DIAGNOSIS — Z9181 History of falling: Secondary | ICD-10-CM | POA: Diagnosis not present

## 2018-02-08 DIAGNOSIS — S2243XD Multiple fractures of ribs, bilateral, subsequent encounter for fracture with routine healing: Secondary | ICD-10-CM | POA: Diagnosis not present

## 2018-02-08 DIAGNOSIS — I10 Essential (primary) hypertension: Secondary | ICD-10-CM | POA: Diagnosis not present

## 2018-02-08 DIAGNOSIS — H8149 Vertigo of central origin, unspecified ear: Secondary | ICD-10-CM | POA: Diagnosis not present

## 2018-02-11 DIAGNOSIS — M810 Age-related osteoporosis without current pathological fracture: Secondary | ICD-10-CM | POA: Diagnosis not present

## 2018-02-11 DIAGNOSIS — I2510 Atherosclerotic heart disease of native coronary artery without angina pectoris: Secondary | ICD-10-CM | POA: Diagnosis not present

## 2018-02-11 DIAGNOSIS — S2243XD Multiple fractures of ribs, bilateral, subsequent encounter for fracture with routine healing: Secondary | ICD-10-CM | POA: Diagnosis not present

## 2018-02-11 DIAGNOSIS — I10 Essential (primary) hypertension: Secondary | ICD-10-CM | POA: Diagnosis not present

## 2018-02-11 DIAGNOSIS — Z8673 Personal history of transient ischemic attack (TIA), and cerebral infarction without residual deficits: Secondary | ICD-10-CM | POA: Diagnosis not present

## 2018-02-11 DIAGNOSIS — Z9181 History of falling: Secondary | ICD-10-CM | POA: Diagnosis not present

## 2018-02-11 DIAGNOSIS — S2222XD Fracture of body of sternum, subsequent encounter for fracture with routine healing: Secondary | ICD-10-CM | POA: Diagnosis not present

## 2018-02-11 DIAGNOSIS — H8149 Vertigo of central origin, unspecified ear: Secondary | ICD-10-CM | POA: Diagnosis not present

## 2018-02-11 DIAGNOSIS — F329 Major depressive disorder, single episode, unspecified: Secondary | ICD-10-CM | POA: Diagnosis not present

## 2018-02-12 DIAGNOSIS — I2510 Atherosclerotic heart disease of native coronary artery without angina pectoris: Secondary | ICD-10-CM | POA: Diagnosis not present

## 2018-02-12 DIAGNOSIS — Z9181 History of falling: Secondary | ICD-10-CM | POA: Diagnosis not present

## 2018-02-12 DIAGNOSIS — F329 Major depressive disorder, single episode, unspecified: Secondary | ICD-10-CM | POA: Diagnosis not present

## 2018-02-12 DIAGNOSIS — S2243XD Multiple fractures of ribs, bilateral, subsequent encounter for fracture with routine healing: Secondary | ICD-10-CM | POA: Diagnosis not present

## 2018-02-12 DIAGNOSIS — S2222XD Fracture of body of sternum, subsequent encounter for fracture with routine healing: Secondary | ICD-10-CM | POA: Diagnosis not present

## 2018-02-12 DIAGNOSIS — H8149 Vertigo of central origin, unspecified ear: Secondary | ICD-10-CM | POA: Diagnosis not present

## 2018-02-12 DIAGNOSIS — M810 Age-related osteoporosis without current pathological fracture: Secondary | ICD-10-CM | POA: Diagnosis not present

## 2018-02-12 DIAGNOSIS — Z8673 Personal history of transient ischemic attack (TIA), and cerebral infarction without residual deficits: Secondary | ICD-10-CM | POA: Diagnosis not present

## 2018-02-12 DIAGNOSIS — I10 Essential (primary) hypertension: Secondary | ICD-10-CM | POA: Diagnosis not present

## 2018-02-13 DIAGNOSIS — Z9181 History of falling: Secondary | ICD-10-CM | POA: Diagnosis not present

## 2018-02-13 DIAGNOSIS — S2243XD Multiple fractures of ribs, bilateral, subsequent encounter for fracture with routine healing: Secondary | ICD-10-CM | POA: Diagnosis not present

## 2018-02-13 DIAGNOSIS — S2222XD Fracture of body of sternum, subsequent encounter for fracture with routine healing: Secondary | ICD-10-CM | POA: Diagnosis not present

## 2018-02-13 DIAGNOSIS — I2510 Atherosclerotic heart disease of native coronary artery without angina pectoris: Secondary | ICD-10-CM | POA: Diagnosis not present

## 2018-02-13 DIAGNOSIS — I10 Essential (primary) hypertension: Secondary | ICD-10-CM | POA: Diagnosis not present

## 2018-02-13 DIAGNOSIS — F329 Major depressive disorder, single episode, unspecified: Secondary | ICD-10-CM | POA: Diagnosis not present

## 2018-02-13 DIAGNOSIS — H8149 Vertigo of central origin, unspecified ear: Secondary | ICD-10-CM | POA: Diagnosis not present

## 2018-02-13 DIAGNOSIS — Z8673 Personal history of transient ischemic attack (TIA), and cerebral infarction without residual deficits: Secondary | ICD-10-CM | POA: Diagnosis not present

## 2018-02-13 DIAGNOSIS — M810 Age-related osteoporosis without current pathological fracture: Secondary | ICD-10-CM | POA: Diagnosis not present

## 2018-02-14 ENCOUNTER — Telehealth: Payer: Self-pay | Admitting: Student

## 2018-02-14 DIAGNOSIS — I10 Essential (primary) hypertension: Secondary | ICD-10-CM | POA: Diagnosis not present

## 2018-02-14 DIAGNOSIS — Z8673 Personal history of transient ischemic attack (TIA), and cerebral infarction without residual deficits: Secondary | ICD-10-CM | POA: Diagnosis not present

## 2018-02-14 DIAGNOSIS — H8149 Vertigo of central origin, unspecified ear: Secondary | ICD-10-CM | POA: Diagnosis not present

## 2018-02-14 DIAGNOSIS — S2243XD Multiple fractures of ribs, bilateral, subsequent encounter for fracture with routine healing: Secondary | ICD-10-CM | POA: Diagnosis not present

## 2018-02-14 DIAGNOSIS — Z9181 History of falling: Secondary | ICD-10-CM | POA: Diagnosis not present

## 2018-02-14 DIAGNOSIS — M810 Age-related osteoporosis without current pathological fracture: Secondary | ICD-10-CM | POA: Diagnosis not present

## 2018-02-14 DIAGNOSIS — S2222XD Fracture of body of sternum, subsequent encounter for fracture with routine healing: Secondary | ICD-10-CM | POA: Diagnosis not present

## 2018-02-14 DIAGNOSIS — F329 Major depressive disorder, single episode, unspecified: Secondary | ICD-10-CM | POA: Diagnosis not present

## 2018-02-14 DIAGNOSIS — I2510 Atherosclerotic heart disease of native coronary artery without angina pectoris: Secondary | ICD-10-CM | POA: Diagnosis not present

## 2018-02-14 NOTE — Telephone Encounter (Signed)
Jill Hancock with [facility] needs verbal to continue home health speech therapy once a week for 2 weeks [phone]

## 2018-02-15 DIAGNOSIS — Z9181 History of falling: Secondary | ICD-10-CM | POA: Diagnosis not present

## 2018-02-15 DIAGNOSIS — H8149 Vertigo of central origin, unspecified ear: Secondary | ICD-10-CM | POA: Diagnosis not present

## 2018-02-15 DIAGNOSIS — S2222XD Fracture of body of sternum, subsequent encounter for fracture with routine healing: Secondary | ICD-10-CM | POA: Diagnosis not present

## 2018-02-15 DIAGNOSIS — S2243XD Multiple fractures of ribs, bilateral, subsequent encounter for fracture with routine healing: Secondary | ICD-10-CM | POA: Diagnosis not present

## 2018-02-15 DIAGNOSIS — I2510 Atherosclerotic heart disease of native coronary artery without angina pectoris: Secondary | ICD-10-CM | POA: Diagnosis not present

## 2018-02-15 DIAGNOSIS — I10 Essential (primary) hypertension: Secondary | ICD-10-CM | POA: Diagnosis not present

## 2018-02-15 DIAGNOSIS — F329 Major depressive disorder, single episode, unspecified: Secondary | ICD-10-CM | POA: Diagnosis not present

## 2018-02-15 DIAGNOSIS — Z8673 Personal history of transient ischemic attack (TIA), and cerebral infarction without residual deficits: Secondary | ICD-10-CM | POA: Diagnosis not present

## 2018-02-15 DIAGNOSIS — M810 Age-related osteoporosis without current pathological fracture: Secondary | ICD-10-CM | POA: Diagnosis not present

## 2018-02-15 NOTE — Telephone Encounter (Signed)
Called Amy back. Gave verbal to authorize this.  Saralyn PilarAlexander Karamalegos, DO [facility] 02/15/2018, 12:39 PM

## 2018-02-21 ENCOUNTER — Other Ambulatory Visit: Payer: Self-pay | Admitting: Nurse Practitioner

## 2018-02-21 DIAGNOSIS — I10 Essential (primary) hypertension: Secondary | ICD-10-CM | POA: Diagnosis not present

## 2018-02-21 DIAGNOSIS — S2222XD Fracture of body of sternum, subsequent encounter for fracture with routine healing: Secondary | ICD-10-CM | POA: Diagnosis not present

## 2018-02-21 DIAGNOSIS — E7849 Other hyperlipidemia: Secondary | ICD-10-CM

## 2018-02-21 DIAGNOSIS — S2243XD Multiple fractures of ribs, bilateral, subsequent encounter for fracture with routine healing: Secondary | ICD-10-CM | POA: Diagnosis not present

## 2018-02-21 DIAGNOSIS — H8149 Vertigo of central origin, unspecified ear: Secondary | ICD-10-CM | POA: Diagnosis not present

## 2018-02-21 DIAGNOSIS — I2510 Atherosclerotic heart disease of native coronary artery without angina pectoris: Secondary | ICD-10-CM | POA: Diagnosis not present

## 2018-02-21 DIAGNOSIS — Z8673 Personal history of transient ischemic attack (TIA), and cerebral infarction without residual deficits: Secondary | ICD-10-CM | POA: Diagnosis not present

## 2018-02-21 DIAGNOSIS — M810 Age-related osteoporosis without current pathological fracture: Secondary | ICD-10-CM

## 2018-02-21 DIAGNOSIS — F329 Major depressive disorder, single episode, unspecified: Secondary | ICD-10-CM | POA: Diagnosis not present

## 2018-02-21 DIAGNOSIS — Z9181 History of falling: Secondary | ICD-10-CM | POA: Diagnosis not present

## 2018-02-21 NOTE — Telephone Encounter (Signed)
Pt needs refills on amlodipine, lisinopril, prevastatin and [facility] [place]  Her call back number is [phone]

## 2018-02-22 MED ORDER — PRAVASTATIN SODIUM 20 MG PO TABS: 20.0000 mg | ORAL_TABLET | Freq: Every day | ORAL | 3 refills | Status: DC

## 2018-02-22 MED ORDER — LISINOPRIL 40 MG PO TABS: 40.0000 mg | ORAL_TABLET | Freq: Every day | ORAL | 3 refills | Status: DC

## 2018-02-22 MED ORDER — ALENDRONATE SODIUM 70 MG PO TABS: ORAL_TABLET | ORAL | 3 refills | Status: DC

## 2018-02-22 MED ORDER — AMLODIPINE BESYLATE 10 MG PO TABS: 10.0000 mg | ORAL_TABLET | Freq: Every day | ORAL | 1 refills | Status: DC

## 2018-02-25 ENCOUNTER — Encounter: Payer: Self-pay | Admitting: Emergency Medicine

## 2018-02-25 ENCOUNTER — Emergency Department: Payer: Medicare HMO

## 2018-02-25 ENCOUNTER — Emergency Department
Admission: EM | Admit: 2018-02-25 | Discharge: 2018-02-26 | Disposition: A | Discharge status: Home / Self Care | Payer: Medicare HMO | Attending: Emergency Medicine | Admitting: Emergency Medicine

## 2018-02-25 ENCOUNTER — Other Ambulatory Visit: Payer: Self-pay

## 2018-02-25 DIAGNOSIS — E785 Hyperlipidemia, unspecified: Secondary | ICD-10-CM | POA: Insufficient documentation

## 2018-02-25 DIAGNOSIS — Z7982 Long term (current) use of aspirin: Secondary | ICD-10-CM | POA: Insufficient documentation

## 2018-02-25 DIAGNOSIS — W01198A Fall on same level from slipping, tripping and stumbling with subsequent striking against other object, initial encounter: Secondary | ICD-10-CM | POA: Insufficient documentation

## 2018-02-25 DIAGNOSIS — Y998 Other external cause status: Secondary | ICD-10-CM | POA: Insufficient documentation

## 2018-02-25 DIAGNOSIS — I10 Essential (primary) hypertension: Secondary | ICD-10-CM | POA: Insufficient documentation

## 2018-02-25 DIAGNOSIS — S8991XA Unspecified injury of right lower leg, initial encounter: Secondary | ICD-10-CM | POA: Diagnosis not present

## 2018-02-25 DIAGNOSIS — S3992XA Unspecified injury of lower back, initial encounter: Secondary | ICD-10-CM | POA: Diagnosis not present

## 2018-02-25 DIAGNOSIS — W19XXXA Unspecified fall, initial encounter: Secondary | ICD-10-CM

## 2018-02-25 DIAGNOSIS — Y929 Unspecified place or not applicable: Secondary | ICD-10-CM | POA: Diagnosis not present

## 2018-02-25 DIAGNOSIS — R531 Weakness: Secondary | ICD-10-CM | POA: Diagnosis not present

## 2018-02-25 DIAGNOSIS — S32000A Wedge compression fracture of unspecified lumbar vertebra, initial encounter for closed fracture: Secondary | ICD-10-CM

## 2018-02-25 DIAGNOSIS — S32030A Wedge compression fracture of third lumbar vertebra, initial encounter for closed fracture: Secondary | ICD-10-CM | POA: Insufficient documentation

## 2018-02-25 DIAGNOSIS — Z79899 Other long term (current) drug therapy: Secondary | ICD-10-CM | POA: Insufficient documentation

## 2018-02-25 DIAGNOSIS — S329XXA Fracture of unspecified parts of lumbosacral spine and pelvis, initial encounter for closed fracture: Secondary | ICD-10-CM | POA: Diagnosis not present

## 2018-02-25 DIAGNOSIS — Y92009 Unspecified place in unspecified non-institutional (private) residence as the place of occurrence of the external cause: Secondary | ICD-10-CM

## 2018-02-25 DIAGNOSIS — Y939 Activity, unspecified: Secondary | ICD-10-CM | POA: Insufficient documentation

## 2018-02-25 DIAGNOSIS — M545 Low back pain: Secondary | ICD-10-CM | POA: Diagnosis not present

## 2018-02-25 DIAGNOSIS — Z8673 Personal history of transient ischemic attack (TIA), and cerebral infarction without residual deficits: Secondary | ICD-10-CM | POA: Insufficient documentation

## 2018-02-25 DIAGNOSIS — M25561 Pain in right knee: Secondary | ICD-10-CM | POA: Diagnosis not present

## 2018-02-25 LAB — BASIC METABOLIC PANEL
Anion gap: 5 (ref 5–15)
BUN: 15 mg/dL (ref 8–23)
CO2: 28 mmol/L (ref 22–32)
CREATININE: 0.8 mg/dL (ref 0.44–1.00)
Calcium: 8.6 mg/dL — ABNORMAL LOW (ref 8.9–10.3)
Chloride: 107 mmol/L (ref 98–111)
GFR calc Af Amer: >60 mL/min (ref >60)
GLUCOSE: 101 mg/dL — AB (ref 70–99)
Potassium: 3.3 mmol/L — ABNORMAL LOW (ref 3.5–5.1)
SODIUM: 140 mmol/L (ref 135–145)

## 2018-02-25 LAB — CBC
HCT: 34.7 % — ABNORMAL LOW (ref 35.0–47.0)
Hemoglobin: 12 g/dL (ref 12.0–16.0)
MCH: 29.6 pg (ref 26.0–34.0)
MCHC: 34.5 g/dL (ref 32.0–36.0)
MCV: 85.7 fL (ref 80.0–100.0)
PLATELETS: 308 10*3/uL (ref 150–440)
RBC: 4.05 MIL/uL (ref 3.80–5.20)
RDW: 14.7 % — ABNORMAL HIGH (ref 11.5–14.5)
WBC: 9.6 10*3/uL (ref 3.6–11.0)

## 2018-02-25 MED ORDER — ASPIRIN 81 MG PO CHEW
81.0000 mg | CHEWABLE_TABLET | Freq: Every day | ORAL | Status: DC
  Administered 2018-02-26: 81 mg via ORAL
  Filled 2018-02-25: qty 1

## 2018-02-25 MED ORDER — LISINOPRIL 10 MG PO TABS
40.0000 mg | ORAL_TABLET | Freq: Every day | ORAL | Status: DC
  Administered 2018-02-26: 40 mg via ORAL

## 2018-02-25 MED ORDER — AMLODIPINE BESYLATE 5 MG PO TABS
10.0000 mg | ORAL_TABLET | Freq: Every day | ORAL | Status: DC
  Administered 2018-02-26: 10 mg via ORAL

## 2018-02-25 MED ORDER — PANTOPRAZOLE SODIUM 40 MG PO TBEC
40.0000 mg | DELAYED_RELEASE_TABLET | Freq: Every day | ORAL | Status: DC
  Administered 2018-02-26: 40 mg via ORAL

## 2018-02-25 MED ORDER — TRAMADOL HCL 50 MG PO TABS
50.0000 mg | ORAL_TABLET | Freq: Four times a day (QID) | ORAL | Status: DC | PRN
  Administered 2018-02-25: 50 mg via ORAL
  Filled 2018-02-25: qty 1

## 2018-02-25 MED ORDER — ACETAMINOPHEN 325 MG PO TABS
650.0000 mg | ORAL_TABLET | Freq: Once | ORAL | Status: AC
  Administered 2018-02-25: 650 mg via ORAL
  Filled 2018-02-25: qty 2

## 2018-02-25 MED ORDER — PRAVASTATIN SODIUM 20 MG PO TABS
20.0000 mg | ORAL_TABLET | Freq: Every day | ORAL | Status: DC
  Filled 2018-02-25: qty 1

## 2018-02-25 MED ORDER — POTASSIUM CHLORIDE CRYS ER 20 MEQ PO TBCR
10.0000 meq | EXTENDED_RELEASE_TABLET | Freq: Every day | ORAL | Status: DC
  Administered 2018-02-26: 10 meq via ORAL

## 2018-02-25 MED ORDER — FLUOXETINE HCL 20 MG PO CAPS
20.0000 mg | ORAL_CAPSULE | Freq: Every day | ORAL | Status: DC
  Administered 2018-02-25: 20 mg via ORAL
  Filled 2018-02-25: qty 1

## 2018-02-25 NOTE — ED Notes (Signed)
Waiting for MRI  Pt aware of wait  Has been up to bathroom with assistance  Ambulates slowly with walker

## 2018-02-25 NOTE — ED Provider Notes (Signed)
[facility] Emergency Department Provider Note ____________________________________________  Time seen: 1351  I have reviewed the triage vital signs and the nursing notes.  HISTORY  Chief Complaint  Fall and Back Pain  HPI Jill Hancock is a 77 y.o. female presents to the ED via EMS, from her home at [facility].  Patient describes tripping over her neighbors dog and landed on her bottom yesterday according to the patient, but the fall actually occurred on Friday, according to the patient's adult daughter who has called to check her status.  Patient denies hitting her head, passing out, nausea, vomiting, or dizziness.  She has had increasing pain to the lower and mid back following the fall.  According to the patient, she landed flat on her back, and notes pain to the midline of her spine.  She has taken her Ultram as previously prescribed, but notes continued discomfort.  She denies any bladder or bowel incontinence, foot drop, or leg weakness.  She does note some pain and swelling to the right knee related to the fall.  Patient has a past medical history of T12 compression fracture as well as hypertension, and previous rib fractures.   Past Medical History:  Diagnosis Date  . Hyperglycemia   . Hypertension   . Hypokalemia   . Stroke [facility](HCC) 02/2012   MRI revealed at least 3 subcentimeter acute infarctions with one area of subacute infarction in widely disparate vascular territories including territory of left cerebellum and around right caudate nucleus along with widespread lacunar infarcts, chronic microvascular ischemic change, and numerous microbleeds suggesting long standing hypertensive cerebrovascular disease.  MRA confirmed intracranial  . Unsteady gait   . Vertigo     Patient Active Problem List   Diagnosis Date Noted  . Multiple rib fractures 12/31/2017  . Primary osteoarthritis of right knee 12/14/2017  . Dementia, vascular 12/04/2017  .  Right shoulder injury 11/12/2015  . Urinary urgency 09/17/2015  . Hyperglycemia 07/29/2015  . Restless leg 06/07/2015  . Benign paroxysmal positional vertigo 06/07/2015  . Hyperlipidemia 04/16/2015  . Vitamin D deficiency 08/13/2014  . T12 compression fracture (HCC) 04/28/2014  . 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 Major depression, recurrent, chronic (HCC) 11/24/2012  . GERD (gastroesophageal reflux disease) 11/24/2012  . Osteoporosis 09/05/2012  . Right hip pain 08/23/2012  . Anxiety 05/04/2012  . Hypertension 02/27/2012  . History of cerebrovascular accident (CVA) with residual deficit 02/25/2012  . Unsteady gait 02/25/2012    Past Surgical History:  Procedure Laterality Date  . BREAST BIOPSY Right    pt not sure when   . CHOLECYSTECTOMY    . VAGINAL HYSTERECTOMY      Prior to Admission medications   Medication Sig Start Date End Date Taking? Authorizing Provider  acetaminophen (TYLENOL) 500 MG tablet Take 500 mg by mouth every 6 (six) hours as needed for mild pain or headache.    Yes [provider]  alendronate (FOSAMAX) 70 MG tablet TAKE 1 TABLET EVERY 7 DAYS.  SEE PACKAGE FOR ADDITIONAL INSTRUCTIONS 02/22/18  Yes Karamalegos, Netta NeatAlexander J, DO  amLODipine (NORVASC) 10 MG tablet Take 1 tablet (10 mg total) by mouth daily. 02/22/18  Yes Karamalegos, Netta NeatAlexander J, DO  aspirin 81 MG tablet Take 1 tablet (81 mg total) by mouth daily. 03/07/13  Yes Ronal FearLam, Lynn E, NP  esomeprazole (NEXIUM) 20 MG capsule Take 1 capsule (20 mg total) by mouth daily before breakfast. 12/14/17  Yes Karamalegos, Netta NeatAlexander J, DO  FLUoxetine (PROZAC) 40 MG capsule  Take 1 capsule (40 mg total) by mouth daily. 01/21/18  Yes Karamalegos, Netta Neat, DO  lisinopril (PRINIVIL,ZESTRIL) 40 MG tablet Take 1 tablet (40 mg total) by mouth daily. 02/22/18  Yes Karamalegos, Netta Neat, DO  meclizine (ANTIVERT) 25 MG tablet Take 1 tablet (25 mg total) by mouth daily as needed for dizziness. 06/12/16  Yes Althia Forts, MD  potassium chloride (K-DUR,KLOR-CON) 10 MEQ tablet Take 1 tablet (10 mEq total) by mouth daily. 10/17/16  Yes Eulah Pont, MD  pravastatin (PRAVACHOL) 20 MG tablet Take 1 tablet (20 mg total) by mouth at bedtime. 02/22/18  Yes Karamalegos, Netta Neat, DO  ciprofloxacin (CIPRO) 500 MG tablet Take 1 tablet (500 mg total) by mouth 2 (two) times daily. For 7 days Patient not taking: Reported on 02/26/2018 02/05/18   Smitty Cords, DO  traMADol (ULTRAM) 50 MG tablet Take 1 tablet (50 mg total) by mouth every 8 (eight) hours as needed for moderate pain (rib fracture). Patient not taking: Reported on 02/26/2018 01/21/18   Smitty Cords, DO    Allergies Demerol [meperidine]; Macrobid [nitrofurantoin macrocrystal]; and Penicillins  Family History  Problem Relation Age of Onset  . Heart attack Mother   . Heart attack Brother        death at age 61  . Stroke Brother        death at 42    Social History Social History   Tobacco Use  . Smoking status: Never Smoker  . Smokeless tobacco: Never Used  Substance Use Topics  . Alcohol use: No    Alcohol/week: 0.0 standard drinks  . Drug use: No    Review of Systems  Constitutional: Negative for fever. Eyes: Negative for visual changes. ENT: Negative for sore throat. Cardiovascular: Negative for chest pain. Respiratory: Negative for shortness of breath. Gastrointestinal: Negative for abdominal pain, vomiting and diarrhea. Genitourinary: Negative for dysuria. Musculoskeletal: Positive for back pain. Right knee pain Skin: Negative for rash. Neurological: Negative for headaches, focal weakness or numbness. ____________________________________________  PHYSICAL EXAM:  VITAL SIGNS: ED Triage Vitals  Enc Vitals Group     BP 02/25/18 1227 121/75     Pulse Rate 02/25/18 1227 88     Resp 02/25/18 1227 16     Temp 02/25/18 1227 98.5 F (36.9 C)     Temp Source 02/25/18 1227 Oral     SpO2 02/25/18 1227 97 %      Weight 02/25/18 1227 135 lb (61.2 kg)     Height 02/25/18 1227 5\' 2"  (1.575 m)     Head Circumference --      Peak Flow --      Pain Score 02/25/18 1228 8     Pain Loc --      Pain Edu? --      Excl. in GC? --     Constitutional: Alert and oriented. Well appearing and in no distress. Head: Normocephalic and atraumatic. Eyes: Conjunctivae are normal. Normal extraocular movements Neck: Supple. Normal ROM without crepitus. No distracting midline tenderness noted.  Cardiovascular: Normal rate, regular rhythm. Normal distal pulses. Respiratory: Normal respiratory effort. No wheezes/rales/rhonchi. Gastrointestinal: Soft and nontender. No distention. Musculoskeletal: Normal spinal alignment with some midline tenderness to palpation over the thoracolumbar junction.  No ecchymosis, bruising, or abrasions noted to the mid or lower back. Slow, assisted transition from sit-to-stand. Nontender with normal range of motion in all extremities.  Right knee without obvious deformity or dislocation.  There is some subtle medial soft tissue swelling  noted.  Normal knee and ankle range of motion.  Right knee without any popliteal space fullness or calf tenderness.  No significant valgus or varus joint stress is appreciated. Neurologic:  Antalgic gait without ataxia. Normal speech and language. No gross focal neurologic deficits are appreciated. Skin:  Skin is warm, dry and intact. No rash noted. Psychiatric: Mood and affect are normal. Patient exhibits appropriate insight and judgment. ____________________________________________   RADIOLOGY  Lumbar Spine  IMPRESSION: Previous fractures of L5 and T12 have progressed. Superior endplate deformities of L1, L3, possibly L2, are new from 2018, age indeterminate.  If no contraindications, consider cross-sectional imaging with MRI as an aid to determination of acuity.  Right Knee  IMPRESSION: No acute or traumatic finding. Tricompartmental osteoarthritis,  most pronounced in the medial compartment.   Lumbar MRI Pending at the time of disposition. ____________________________________________  PROCEDURES  Procedures Tylenol 650 mg PO ____________________________________________  INITIAL IMPRESSION / ASSESSMENT AND PLAN / ED COURSE  Patient with ED evaluation of injury sustained due to a mechanical fall. Her plain films were concerning for new compression fractures. MRI is pending at the time of disposition. Her care will be transferred to my colleague, Jill Neighbor, PA-C.   I have spoken to the patient's daughter, who voices some concern about the patient's single living arrangement. I will place a social work consult for further evaluation.   Disposition pending MRI results & social work consult.  ____________________________________________  FINAL CLINICAL IMPRESSION(S) / ED DIAGNOSES  Final diagnoses:  Fall in home, initial encounter  Weakness  Closed compression fracture of lumbosacral spine, initial encounter (HCC)       Karmen Stabs, Charlesetta Ivory, PA-C 02/26/18 1847    Sharman Cheek, MD 03/03/18 2358

## 2018-02-25 NOTE — ED Notes (Signed)
PT BACK FROM MRI

## 2018-02-25 NOTE — ED Triage Notes (Signed)
Patient to ED via [facility]. States she was tripped up by neighbors dog and fell landing on her bottom yesterday. Complaining worsening lower back pain today. Patient denies hitting head or LOC.

## 2018-02-25 NOTE — ED Notes (Signed)
See triage note  States she fell on Friday  Presents with pain to lower back and right knee  No deformity noted to knee  Ambulates slowly with assistance

## 2018-02-25 NOTE — ED Provider Notes (Addendum)
77 year old female with acute on chronic lower back pain from a recent fall.  MRI confirms acute L3 compression fracture with 20% loss of vertebral body height.  Home pain medications for her chronic pain consist of tramadol 50 mg every 6 hours, patient states she normally takes 50 mg only in the morning.  Midline low back pain at this time is 7 out of 10.  Patient denies any other pain throughout her body.  Patient states her current low back pain is increased from her baseline pain.  She is currently able to ambulate with her walker which is close to her baseline.  She is uncomfortable with lying in the bed as well as with sitting.  She denies any radicular symptoms or weakness in the lower extremities.  Consult for social work placed today as patient lives at home alone and no family members are able to stay with her tonight. Patient will need to stay overnight and be evaluated by social worker in the morning.  Home medications were reviewed with the patient and ordered, including tramadol 50 mg every 6 hours to help with her current pain.  PT consult placed for in the morning.  Labs are pending.  Care transferred to Carmin Muskratarrie Beth, NP.   Evon SlackGaines, Erikson Danzy C, PA-C 02/25/18 2347    Evon SlackGaines, Gelsey Amyx C, PA-C 02/25/18 2351    Jeanmarie PlantMcShane, James A, MD 02/26/18 [phone]

## 2018-02-26 ENCOUNTER — Ambulatory Visit: Payer: Self-pay | Admitting: Urology

## 2018-02-26 MED ORDER — PANTOPRAZOLE SODIUM 40 MG PO TBEC
DELAYED_RELEASE_TABLET | ORAL | Status: AC
  Administered 2018-02-26: 40 mg via ORAL
  Filled 2018-02-26: qty 1

## 2018-02-26 MED ORDER — POTASSIUM CHLORIDE CRYS ER 20 MEQ PO TBCR
EXTENDED_RELEASE_TABLET | ORAL | Status: AC
  Administered 2018-02-26: 10 meq via ORAL
  Filled 2018-02-26: qty 1

## 2018-02-26 MED ORDER — LISINOPRIL 10 MG PO TABS
ORAL_TABLET | ORAL | Status: AC
  Administered 2018-02-26: 40 mg via ORAL
  Filled 2018-02-26: qty 4

## 2018-02-26 MED ORDER — AMLODIPINE BESYLATE 5 MG PO TABS
ORAL_TABLET | ORAL | Status: AC
  Administered 2018-02-26: 10 mg via ORAL
  Filled 2018-02-26: qty 2

## 2018-02-26 NOTE — ED Notes (Signed)
Provided pt with lunch tray and cola to drink. Denies further needs.
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
## 2018-02-26 NOTE — ED Notes (Signed)
Pt ambulatory to commode in room with walker with steady gait. She was returned to bed with all rails up and call bell in reach. Denies further needs at this time.

## 2018-02-26 NOTE — Clinical Social Work Note (Addendum)
CSW received a consult for "Lovey Newcomer (pt daughter) has some concerns about solo living arrangement. Thinks patient needs assisted living arrangement." CSW staffed with EDP Dr. Jimmye Norman and EDRN Joni. Physical Therapy recommending home health.  CSW met with patient at bedside. Patient alert and oriented x4. Patient from [place] and lives alone. Patient adamantly refusing Assisted Living placement. Patient stated she is able to "take care of herself." CSW stated that CSW made a referral for patient to [facility] day program when patient was here in June of 2019 and asked if patient followed up with them. Patient stated she did not, but she went to [facility] for rehab. Patient states already has home health services in place for speech and physical therapy and she believes it is through [facility]. Patient states she has told her daughters she "is not going to a facility." Patient gave permission for patient to contact patient's daughter. CSW called patient's daughter [name] ([phone]), who stated she would call CSW back after her meeting. CSW notified RN Care Manager Josh [phone]) of home health consult. RNCM Josh to follow up with patient.   UPDATE: CSW received call back from patient's daughter [name] and spoke with her at length. CSW provided update of discussion with patient. Ms. Cyndi Bender wanted CSW to place patient in a facility. CSW reiterated that CSW cannot place patient in any facility unless she is agreeable, as she has the right to self-determination. CSW emphasized that patient is her own guardian and she has the capacity to make her own decisions, even if family does not agree. Ms. Cyndi Bender aware that home health services were resumed and additional services were added, including a Education officer, museum. Ms. Cyndi Bender also informed that patient provided with [place] [facility] [place] list, placement process list, and PACE information sheet. Ms.  Cyndi Bender stated she would be picking up patient in 30 minutes. CSW updated EDP Dr. Jimmye Norman and Marca Ancona. Patient provided with CSW signing off as no further Social Work needs identified.   Oretha Ellis, Latanya Presser, [place] Worker-Emergency Department [phone]

## 2018-02-26 NOTE — ED Notes (Signed)
Helped pt to bathroom. 

## 2018-02-26 NOTE — Care Management Note (Signed)
Case Management Note  Patient Details  Name: Georgiann CockerLinda S Bourdon MRN: [id number] Date of Birth: 01-16-1941  Subjective/Objective:      Patient to be discharged per MD order. Orders in place for home health services. Patient open to [facility]. New orders are for PT,OT, RN, aide and social work. Spoke with Brad from [facility] regarding new resumption orders. Patient agreeable to continue services. No DME needs. Transport to be arranged by ED.                                 Action/Plan:   Expected Discharge Date:                  Expected Discharge Plan:  Home w Home Health Services  In-House Referral:     Discharge planning Services  CM Consult  Post Acute Care Choice:  Home Health, Resumption of Svcs/PTA Provider Choice offered to:  Patient  DME Arranged:    DME Agency:     HH Arranged:  RN, PT, OT, Nurse's Aide, Social Work [employer] Agency:  [facility]  Status of Service:  Completed, signed off  If discussed at [employer] Length of [employer] Meetings, dates discussed:    Additional Comments:  Virgel ManifoldJosh A Kalayla Shadden, RN 02/26/2018, 1:55 PM

## 2018-02-26 NOTE — ED Notes (Addendum)
Assumed care of pt at this time. She is resting comfortably on bed in no acute distress. Pt alert and calm. She answers orientation questions correctly and does not appear to be confused at this time. Informed her that her breakfast tray will be delivered in the next hour. She voiced understanding. She stated that she would like to get washed after while. Informed her to call when she is ready and I will help her. She denies further needs at this time. She denies pain and does not want prn med right now. Will continue to monitor.

## 2018-02-26 NOTE — ED Notes (Signed)
Pt resting comfortably watching tv

## 2018-02-26 NOTE — ED Notes (Signed)
PT at bedside evaluating pt.

## 2018-02-26 NOTE — ED Notes (Signed)
Pt sleeping. Respirations even and unlabored. NAD. Stretcher in low and locked position. Call bell in reach. Will continue to monitor. 

## 2018-02-26 NOTE — ED Notes (Signed)
Spoke with SW concerning pt and they are aware of her. SW will be coming to see pt and will communicate with her daughter for plan determination. Pt's daughter continues to call ED but refuses to come to ED. Phone provided to pt so daughter could speak to her and she told pt to call out for this RN so I would come to the room and speak to her again. Will continue to follow situation.

## 2018-02-26 NOTE — ED Provider Notes (Signed)
-----------------------------------------   12:24 AM on 02/26/2018 -----------------------------------------  My name was entered in error on this pt. I did not see or treat this patient.    Jeanmarie PlantMcShane, James A, MD 02/26/18 Moses [id number]

## 2018-02-26 NOTE — ED Notes (Signed)
Pt wheeled to daughter's car in wheelchair. Pt voiced understanding of discharge paperwork. Daughter educated about resources provided by SW. Pt oriented x4 on discharge.

## 2018-02-26 NOTE — Care Management Note (Deleted)
Case Management Note  Patient Details  Name: Georgiann CockerLinda S Perlstein MRN: [id number] Date of Birth: Nov 18, 1940  Subjective/Objective:   Patient to be discharged per MD order. Orders in place for home health services. Patient open to [facility]. New orders are for PT,OT, RN, aide and social work. Spoke with Brad from [facility] regarding new resumption orders. Patient agreeable to continue services. No DME needs. Transport to be arranged by ED.                   Action/Plan:   Expected Discharge Date:                  Expected Discharge Plan:     In-House Referral:     Discharge planning Services     Post Acute Care Choice:    Choice offered to:     DME Arranged:    DME Agency:     HH Arranged:    HH Agency:     Status of Service:     If discussed at [employer] Length of Stay Meetings, dates discussed:    Additional Comments:  Virgel ManifoldJosh A Jamien Casanova, RN 02/26/2018, 1:53 PM

## 2018-02-26 NOTE — Evaluation (Addendum)
Physical Therapy Evaluation Patient Details Name: Jill Hancock Pita MRN: [id number] DOB: 06-17-1941 Today'Hancock Date: 02/26/2018   History of Present Illness  Pt is a 77 y.o. female addmitted to the ED for acute on chronic LBP 2/2 to trip and fall at home on a friends dog. During stay pt had MRI which found acute L3 compression fracture. Pt PMH includes T12 compression fracture, rib fracture, stroke (2013), hyperglycemia, and vertigo.  Clinical Impression  Pt is a pleasant 77 year old female who was admitted for acute on chronic LBP with PMH listied above. Pt was Mod I with most tasks prior to ED stay and fell 2/2 to trip on dog. Pt is functioning at roughly baseline, but is currently limited by increased pain. Pt performs bed mobility with CGA, transfers sit<>stand with CGA, and ambulates 50' with supervision/RW. Pt feels better in standing and walking, see below for mobility specifics. Pt demonstrates deficits with functional mobility 2/2 increased pain, and should improve as pain is managed. Pt Would benefit from skilled PT to address above deficits and promote optimal return to PLOF. PT recommends d/c to home with home health PT and intermittent supervision until pain decreases. This entire session was guided, instructed, and directly supervised by Lynnette Caffeyavid Tanner, DPT.     Follow Up Recommendations Home health PT;Supervision - Intermittent    Equipment Recommendations  None recommended by PT    Recommendations for Other Services       Precautions / Restrictions Precautions Precautions: Fall Restrictions Weight Bearing Restrictions: No      Mobility  Bed Mobility Overal bed mobility: Needs Assistance Bed Mobility: Supine to Sit;Rolling;Sit to Supine Rolling: Supervision   Supine to sit: Min guard Sit to supine: Min guard   General bed mobility comments: Pt required CGA for log roll to EOB for tactile cueing. Pt also benefited from verbal cueing. Pt was famillar with log roll  technique. Pt was limited by increased pain.  Transfers Overall transfer level: Needs assistance Equipment used: Rolling walker (2 wheeled) Transfers: Sit to/from Stand Sit to Stand: Min guard         General transfer comment: Pt required CGA 2/2 unsteadiness and very slow progression with sit<>stand. Pt benefited from multimodal cueing for appropriate use of RW, and to improve sequencing.  Ambulation/Gait Ambulation/Gait assistance: Supervision Gait Distance (Feet): 50 Feet Assistive device: Rolling walker (2 wheeled) Gait Pattern/deviations: Step-through pattern     General Gait Details: Pt benefited form supervision with ambulation for safety, but had decreased pain with standing and ambulation.   Stairs            Wheelchair Mobility    Modified Rankin (Stroke Patients Only)       Balance Overall balance assessment: Needs assistance Sitting-balance support: No upper extremity supported;Feet supported Sitting balance-Leahy Scale: Good     Standing balance support: Bilateral upper extremity supported Standing balance-Leahy Scale: Fair                               Pertinent Vitals/Pain Pain Assessment: 0-10 Pain Score: 7  Pain Location: back Pain Descriptors / Indicators: Grimacing;Discomfort;Moaning Pain Intervention(Hancock): Limited activity within patient'Hancock tolerance;Monitored during session;Repositioned;Other (comment)(log roll technique)    Home Living Family/patient expects to be discharged to:: Private residence Living Arrangements: Alone Available Help at Discharge: (none per pt and nurse) Type of Home: Apartment Home Access: Level entry     Home Layout: One level Home Equipment: Dan HumphreysWalker -  4 wheels Additional Comments: per pt and nurse pt will not have reliable assistance at home.    Prior Function Level of Independence: Needs assistance   Gait / Transfers Assistance Needed: Mod I with 7QI  ADL'Hancock / Homemaking Assistance Needed: Pt  was Mod I with most task but required transportation, and assistance with shopping (groceries). Pt states no need for physical assist with toileting, clothing, eating, cooking, and household chores.        Hand Dominance        Extremity/Trunk Assessment   Upper Extremity Assessment Upper Extremity Assessment: Overall WFL for tasks assessed    Lower Extremity Assessment Lower Extremity Assessment: Overall WFL for tasks assessed    Cervical / Trunk Assessment Cervical / Trunk Assessment: Kyphotic  Communication   Communication: Other (comment)(PT noted slurred and delayed speech which pt states is baseline)  Cognition Arousal/Alertness: Awake/alert Behavior During Therapy: WFL for tasks assessed/performed Overall Cognitive Status: Within Functional Limits for tasks assessed                                        General Comments      Exercises     Assessment/Plan    PT Assessment Patient needs continued PT services  PT Problem List Decreased strength;Decreased range of motion;Decreased balance;Decreased activity tolerance;Decreased mobility;Decreased coordination;Decreased cognition;Pain       PT Treatment Interventions DME instruction;Gait training;Functional mobility training;Therapeutic activities;Therapeutic exercise;Balance training;Neuromuscular re-education;Patient/family education    PT Goals (Current goals can be found in the Care Plan section)  Acute Rehab PT Goals Patient Stated Goal: return to PLOF PT Goal Formulation: With patient Time For Goal Achievement: 03/12/18 Potential to Achieve Goals: Good    Frequency Min 2X/week   Barriers to discharge Decreased caregiver support per pt and nurse no help at home. family unwilling to help.    Co-evaluation               AM-PAC PT "6 Clicks" Daily Activity  Outcome Measure Difficulty turning over in bed (including adjusting bedclothes, sheets and blankets)?: Unable Difficulty  moving from lying on back to sitting on the side of the bed? : Unable Difficulty sitting down on and standing up from a chair with arms (e.g., wheelchair, bedside commode, etc,.)?: Unable Help needed moving to and from a bed to chair (including a wheelchair)?: A Little Help needed walking in hospital room?: A Little Help needed climbing 3-5 steps with a railing? : A Little 6 Click Score: 12    End of Session Equipment Utilized During Treatment: Gait belt Activity Tolerance: Patient tolerated treatment well;Patient limited by pain Patient left: in bed;with call bell/phone within reach;with bed alarm set Nurse Communication: Mobility status;Other (comment)(recommendation for supervison at home) PT Visit Diagnosis: Unsteadiness on feet (R26.81);Other abnormalities of gait and mobility (R26.89);History of falling (Z91.81);Difficulty in walking, not elsewhere classified (R26.2);Pain Pain - Right/Left: (back) Pain - part of body: (back)    Time: 6962-9528 PT Time Calculation (min) (ACUTE ONLY): 23 min   Charges:              Renaldo Harrison, SPT   Renaldo Harrison 02/26/2018, 10:24 AM

## 2018-02-26 NOTE — ED Notes (Signed)
Provided pt with breakfast tray

## 2018-02-26 NOTE — ED Provider Notes (Signed)
Patient is going to go home after extensive work-up done here by social work.  Family is coming to take the patient home.  Home health, physical therapy has been arranged for the patient.   Emily FilbertWilliams, Jonathan E, MD 02/26/18 1346

## 2018-02-26 NOTE — ED Notes (Addendum)
Wyatt PortelaLorrie Boone is pt's daughter and her phone number is [phone].

## 2018-02-26 NOTE — ED Notes (Signed)
SW at bedside at this time

## 2018-02-27 DIAGNOSIS — M810 Age-related osteoporosis without current pathological fracture: Secondary | ICD-10-CM | POA: Diagnosis not present

## 2018-02-27 DIAGNOSIS — Z8673 Personal history of transient ischemic attack (TIA), and cerebral infarction without residual deficits: Secondary | ICD-10-CM | POA: Diagnosis not present

## 2018-02-27 DIAGNOSIS — I10 Essential (primary) hypertension: Secondary | ICD-10-CM | POA: Diagnosis not present

## 2018-02-27 DIAGNOSIS — I2510 Atherosclerotic heart disease of native coronary artery without angina pectoris: Secondary | ICD-10-CM | POA: Diagnosis not present

## 2018-02-27 DIAGNOSIS — H8149 Vertigo of central origin, unspecified ear: Secondary | ICD-10-CM | POA: Diagnosis not present

## 2018-02-27 DIAGNOSIS — Z9181 History of falling: Secondary | ICD-10-CM | POA: Diagnosis not present

## 2018-02-27 DIAGNOSIS — S2243XD Multiple fractures of ribs, bilateral, subsequent encounter for fracture with routine healing: Secondary | ICD-10-CM | POA: Diagnosis not present

## 2018-02-27 DIAGNOSIS — S2222XD Fracture of body of sternum, subsequent encounter for fracture with routine healing: Secondary | ICD-10-CM | POA: Diagnosis not present

## 2018-02-27 DIAGNOSIS — F329 Major depressive disorder, single episode, unspecified: Secondary | ICD-10-CM | POA: Diagnosis not present

## 2018-02-28 DIAGNOSIS — M810 Age-related osteoporosis without current pathological fracture: Secondary | ICD-10-CM | POA: Diagnosis not present

## 2018-02-28 DIAGNOSIS — I2510 Atherosclerotic heart disease of native coronary artery without angina pectoris: Secondary | ICD-10-CM | POA: Diagnosis not present

## 2018-02-28 DIAGNOSIS — I10 Essential (primary) hypertension: Secondary | ICD-10-CM | POA: Diagnosis not present

## 2018-02-28 DIAGNOSIS — F329 Major depressive disorder, single episode, unspecified: Secondary | ICD-10-CM | POA: Diagnosis not present

## 2018-02-28 DIAGNOSIS — H8149 Vertigo of central origin, unspecified ear: Secondary | ICD-10-CM | POA: Diagnosis not present

## 2018-02-28 DIAGNOSIS — Z8673 Personal history of transient ischemic attack (TIA), and cerebral infarction without residual deficits: Secondary | ICD-10-CM | POA: Diagnosis not present

## 2018-02-28 DIAGNOSIS — Z9181 History of falling: Secondary | ICD-10-CM | POA: Diagnosis not present

## 2018-02-28 DIAGNOSIS — S2243XD Multiple fractures of ribs, bilateral, subsequent encounter for fracture with routine healing: Secondary | ICD-10-CM | POA: Diagnosis not present

## 2018-02-28 DIAGNOSIS — S2222XD Fracture of body of sternum, subsequent encounter for fracture with routine healing: Secondary | ICD-10-CM | POA: Diagnosis not present

## 2018-03-01 ENCOUNTER — Telehealth: Payer: Self-pay | Admitting: Family Medicine

## 2018-03-01 DIAGNOSIS — S2243XD Multiple fractures of ribs, bilateral, subsequent encounter for fracture with routine healing: Secondary | ICD-10-CM | POA: Diagnosis not present

## 2018-03-01 DIAGNOSIS — F329 Major depressive disorder, single episode, unspecified: Secondary | ICD-10-CM | POA: Diagnosis not present

## 2018-03-01 DIAGNOSIS — Z9181 History of falling: Secondary | ICD-10-CM | POA: Diagnosis not present

## 2018-03-01 DIAGNOSIS — Z8673 Personal history of transient ischemic attack (TIA), and cerebral infarction without residual deficits: Secondary | ICD-10-CM | POA: Diagnosis not present

## 2018-03-01 DIAGNOSIS — I10 Essential (primary) hypertension: Secondary | ICD-10-CM | POA: Diagnosis not present

## 2018-03-01 DIAGNOSIS — I2510 Atherosclerotic heart disease of native coronary artery without angina pectoris: Secondary | ICD-10-CM | POA: Diagnosis not present

## 2018-03-01 DIAGNOSIS — M810 Age-related osteoporosis without current pathological fracture: Secondary | ICD-10-CM | POA: Diagnosis not present

## 2018-03-01 DIAGNOSIS — H8149 Vertigo of central origin, unspecified ear: Secondary | ICD-10-CM | POA: Diagnosis not present

## 2018-03-01 DIAGNOSIS — S2222XD Fracture of body of sternum, subsequent encounter for fracture with routine healing: Secondary | ICD-10-CM | POA: Diagnosis not present

## 2018-03-01 NOTE — Telephone Encounter (Signed)
Verbal given 

## 2018-03-01 NOTE — Telephone Encounter (Signed)
Irving Burtonmily with [facility] needs verbal for PT once a week for 1 week , then twice a week for 2 weeks [phone]

## 2018-03-04 DIAGNOSIS — Z8673 Personal history of transient ischemic attack (TIA), and cerebral infarction without residual deficits: Secondary | ICD-10-CM | POA: Diagnosis not present

## 2018-03-04 DIAGNOSIS — Z9181 History of falling: Secondary | ICD-10-CM | POA: Diagnosis not present

## 2018-03-04 DIAGNOSIS — F329 Major depressive disorder, single episode, unspecified: Secondary | ICD-10-CM | POA: Diagnosis not present

## 2018-03-04 DIAGNOSIS — S2243XD Multiple fractures of ribs, bilateral, subsequent encounter for fracture with routine healing: Secondary | ICD-10-CM | POA: Diagnosis not present

## 2018-03-04 DIAGNOSIS — M810 Age-related osteoporosis without current pathological fracture: Secondary | ICD-10-CM | POA: Diagnosis not present

## 2018-03-04 DIAGNOSIS — H8149 Vertigo of central origin, unspecified ear: Secondary | ICD-10-CM | POA: Diagnosis not present

## 2018-03-04 DIAGNOSIS — I2510 Atherosclerotic heart disease of native coronary artery without angina pectoris: Secondary | ICD-10-CM | POA: Diagnosis not present

## 2018-03-04 DIAGNOSIS — I10 Essential (primary) hypertension: Secondary | ICD-10-CM | POA: Diagnosis not present

## 2018-03-04 DIAGNOSIS — S2222XD Fracture of body of sternum, subsequent encounter for fracture with routine healing: Secondary | ICD-10-CM | POA: Diagnosis not present

## 2018-03-05 ENCOUNTER — Other Ambulatory Visit: Payer: Self-pay

## 2018-03-05 DIAGNOSIS — S2222XD Fracture of body of sternum, subsequent encounter for fracture with routine healing: Secondary | ICD-10-CM | POA: Diagnosis not present

## 2018-03-05 DIAGNOSIS — F329 Major depressive disorder, single episode, unspecified: Secondary | ICD-10-CM | POA: Diagnosis not present

## 2018-03-05 DIAGNOSIS — Z9181 History of falling: Secondary | ICD-10-CM | POA: Diagnosis not present

## 2018-03-05 DIAGNOSIS — S2243XD Multiple fractures of ribs, bilateral, subsequent encounter for fracture with routine healing: Secondary | ICD-10-CM | POA: Diagnosis not present

## 2018-03-05 DIAGNOSIS — Z8673 Personal history of transient ischemic attack (TIA), and cerebral infarction without residual deficits: Secondary | ICD-10-CM | POA: Diagnosis not present

## 2018-03-05 DIAGNOSIS — I10 Essential (primary) hypertension: Secondary | ICD-10-CM | POA: Diagnosis not present

## 2018-03-05 DIAGNOSIS — M810 Age-related osteoporosis without current pathological fracture: Secondary | ICD-10-CM | POA: Diagnosis not present

## 2018-03-05 DIAGNOSIS — I2510 Atherosclerotic heart disease of native coronary artery without angina pectoris: Secondary | ICD-10-CM | POA: Diagnosis not present

## 2018-03-05 DIAGNOSIS — H8149 Vertigo of central origin, unspecified ear: Secondary | ICD-10-CM | POA: Diagnosis not present

## 2018-03-05 NOTE — Patient Outreach (Signed)
[facility]) Care Management  03/05/2018  Jill Hancock 1940/11/20 [id number]   Referral Date: 03/05/18 Referral Source: [facility] Referral Reason: HTN   Outreach Attempt: spoke with daughter Zella Ball who referred to her sisters [name] and [place].  Telephone call to daughter [name].  She is able to verify HIPAA.  She is really not sure of referral.  Discussed [facility] services and how we could help patient/family.  Daughter [name] is agreeable to nurse and social work.  Nursing for continued care management needs: patient high risk for re-admit and frequent falls.  Social work for resources for home health aide and help with meals on wheels.  She referred that CM speak with sister [name].  Telephone call to daughter [name].  She is able to verify HIPAA  Discussed patient care and that [facility] is only short term and having a nurse in place before they discharge her from services and how [facility] can benefit patient.  She wants to talk with her sisters and call CM back.     Patient has HTN, multiple falls, some dementia per daughter [name].  Patient lives in independent apartment and her 3 daughters provide care and assistance to patient.  Patient has had fractures of her spine and ribs.  Patient needs assistance with bathing and dressing. Daughters express the need for resources for home health aide as [facility] will be discontinuing.     Call to Debra at [facility] to clarify referral reason.  She states that [facility] would be discharging patient soon and felt that patient would benefit from [facility] involvement.  She states she spoke with daughter [name] who was agreeable with referral to [facility].      Plan: RN CM will wait for daughter to return call with decision.   [name], RN, MSN [facility] Care Management Care Management Coordinator Direct Line [phone] Toll Free: [phone]  Fax: [phone]

## 2018-03-06 ENCOUNTER — Ambulatory Visit: Payer: Self-pay | Admitting: Family Medicine

## 2018-03-06 DIAGNOSIS — I2510 Atherosclerotic heart disease of native coronary artery without angina pectoris: Secondary | ICD-10-CM | POA: Diagnosis not present

## 2018-03-06 DIAGNOSIS — Z9181 History of falling: Secondary | ICD-10-CM | POA: Diagnosis not present

## 2018-03-06 DIAGNOSIS — I10 Essential (primary) hypertension: Secondary | ICD-10-CM | POA: Diagnosis not present

## 2018-03-06 DIAGNOSIS — F329 Major depressive disorder, single episode, unspecified: Secondary | ICD-10-CM | POA: Diagnosis not present

## 2018-03-06 DIAGNOSIS — H8149 Vertigo of central origin, unspecified ear: Secondary | ICD-10-CM | POA: Diagnosis not present

## 2018-03-06 DIAGNOSIS — M810 Age-related osteoporosis without current pathological fracture: Secondary | ICD-10-CM | POA: Diagnosis not present

## 2018-03-06 DIAGNOSIS — Z8673 Personal history of transient ischemic attack (TIA), and cerebral infarction without residual deficits: Secondary | ICD-10-CM | POA: Diagnosis not present

## 2018-03-06 DIAGNOSIS — S2222XD Fracture of body of sternum, subsequent encounter for fracture with routine healing: Secondary | ICD-10-CM | POA: Diagnosis not present

## 2018-03-06 DIAGNOSIS — S2243XD Multiple fractures of ribs, bilateral, subsequent encounter for fracture with routine healing: Secondary | ICD-10-CM | POA: Diagnosis not present

## 2018-03-07 DIAGNOSIS — H8149 Vertigo of central origin, unspecified ear: Secondary | ICD-10-CM | POA: Diagnosis not present

## 2018-03-07 DIAGNOSIS — Z8673 Personal history of transient ischemic attack (TIA), and cerebral infarction without residual deficits: Secondary | ICD-10-CM | POA: Diagnosis not present

## 2018-03-07 DIAGNOSIS — F329 Major depressive disorder, single episode, unspecified: Secondary | ICD-10-CM | POA: Diagnosis not present

## 2018-03-07 DIAGNOSIS — I2510 Atherosclerotic heart disease of native coronary artery without angina pectoris: Secondary | ICD-10-CM | POA: Diagnosis not present

## 2018-03-07 DIAGNOSIS — S2222XD Fracture of body of sternum, subsequent encounter for fracture with routine healing: Secondary | ICD-10-CM | POA: Diagnosis not present

## 2018-03-07 DIAGNOSIS — Z9181 History of falling: Secondary | ICD-10-CM | POA: Diagnosis not present

## 2018-03-07 DIAGNOSIS — S2243XD Multiple fractures of ribs, bilateral, subsequent encounter for fracture with routine healing: Secondary | ICD-10-CM | POA: Diagnosis not present

## 2018-03-07 DIAGNOSIS — M810 Age-related osteoporosis without current pathological fracture: Secondary | ICD-10-CM | POA: Diagnosis not present

## 2018-03-07 DIAGNOSIS — I10 Essential (primary) hypertension: Secondary | ICD-10-CM | POA: Diagnosis not present

## 2018-03-11 ENCOUNTER — Other Ambulatory Visit: Payer: Self-pay

## 2018-03-11 DIAGNOSIS — Z9181 History of falling: Secondary | ICD-10-CM | POA: Diagnosis not present

## 2018-03-11 DIAGNOSIS — Z8673 Personal history of transient ischemic attack (TIA), and cerebral infarction without residual deficits: Secondary | ICD-10-CM | POA: Diagnosis not present

## 2018-03-11 DIAGNOSIS — M810 Age-related osteoporosis without current pathological fracture: Secondary | ICD-10-CM | POA: Diagnosis not present

## 2018-03-11 DIAGNOSIS — I2510 Atherosclerotic heart disease of native coronary artery without angina pectoris: Secondary | ICD-10-CM | POA: Diagnosis not present

## 2018-03-11 DIAGNOSIS — S2243XD Multiple fractures of ribs, bilateral, subsequent encounter for fracture with routine healing: Secondary | ICD-10-CM | POA: Diagnosis not present

## 2018-03-11 DIAGNOSIS — H8149 Vertigo of central origin, unspecified ear: Secondary | ICD-10-CM | POA: Diagnosis not present

## 2018-03-11 DIAGNOSIS — F329 Major depressive disorder, single episode, unspecified: Secondary | ICD-10-CM | POA: Diagnosis not present

## 2018-03-11 DIAGNOSIS — I10 Essential (primary) hypertension: Secondary | ICD-10-CM | POA: Diagnosis not present

## 2018-03-11 DIAGNOSIS — S2222XD Fracture of body of sternum, subsequent encounter for fracture with routine healing: Secondary | ICD-10-CM | POA: Diagnosis not present

## 2018-03-11 NOTE — Patient Outreach (Addendum)
[facility]) Care Management  03/11/2018  Jill Hancock Jun 24, 1941 [id number]   Follow up telephone call to daughter Jill Hancock to find out what family decided on for [facility].  She states that they are agreeable to community nurse and social work.  She is aware that [facility] will be exiting and they want the support of [facility] to assist in keeping patient in current environment.  Patient has had frequent falls, frequent UTI's and has some early dementia per daughters.  Patient high risk for readmit 4 ED/hospitalization within 6 months.  Daughter Jill Hancock is main contact.  Plan:  RN CM will refer to community for care management and care coordination. RN CM will refer to social work for assistance with getting meals on wheels.    [name], RN, MSN [facility] Care Management Care Management Coordinator Direct Line [phone] Cell [phone] Toll Free: [phone]  Fax: [phone]

## 2018-03-13 ENCOUNTER — Other Ambulatory Visit: Payer: Self-pay

## 2018-03-13 DIAGNOSIS — I2510 Atherosclerotic heart disease of native coronary artery without angina pectoris: Secondary | ICD-10-CM | POA: Diagnosis not present

## 2018-03-13 DIAGNOSIS — S2243XD Multiple fractures of ribs, bilateral, subsequent encounter for fracture with routine healing: Secondary | ICD-10-CM | POA: Diagnosis not present

## 2018-03-13 DIAGNOSIS — Z8673 Personal history of transient ischemic attack (TIA), and cerebral infarction without residual deficits: Secondary | ICD-10-CM | POA: Diagnosis not present

## 2018-03-13 DIAGNOSIS — H8149 Vertigo of central origin, unspecified ear: Secondary | ICD-10-CM | POA: Diagnosis not present

## 2018-03-13 DIAGNOSIS — Z9181 History of falling: Secondary | ICD-10-CM | POA: Diagnosis not present

## 2018-03-13 DIAGNOSIS — M810 Age-related osteoporosis without current pathological fracture: Secondary | ICD-10-CM | POA: Diagnosis not present

## 2018-03-13 DIAGNOSIS — F329 Major depressive disorder, single episode, unspecified: Secondary | ICD-10-CM | POA: Diagnosis not present

## 2018-03-13 DIAGNOSIS — I10 Essential (primary) hypertension: Secondary | ICD-10-CM | POA: Diagnosis not present

## 2018-03-13 DIAGNOSIS — S2222XD Fracture of body of sternum, subsequent encounter for fracture with routine healing: Secondary | ICD-10-CM | POA: Diagnosis not present

## 2018-03-13 NOTE — Patient Outreach (Signed)
[facility]) Care Management  03/13/2018  Jill Hancock [birth year] [id number]  BSW received e-mail notification from Casilda Carls stating the patient is successfully placed on the [facility] [employer] and is the next in line to receive meals.  Bevelyn Ngo, BSW, CDP [facility] [phone]

## 2018-03-13 NOTE — Patient Outreach (Signed)
[facility]) Care Management  03/13/2018  Roselee Pontarelli [place] July 15, 1940 [id number]  Care Coordination: Unsuccessful telephone encounter to the above patient, however VM message on patients given home number is daughters name, Reggie Pile (emergiency contact). RN CM attempting to establish patient with [facility] Community Care Management Services related to [facility] will soon discharge patient and patient is high risk for readmission related to frequent falls. Referral received 03/11/18 from [facility] Telephonic CM who confirmed needed services from patient's daughter Reggie Pile. Daughter gave verbal consent for [facility] Community CM. RN CM left HIPAA compliant VM requesting call back.  Plan: Will send unsuccessful outreach letter and attempt 2nd phone contact in 3-4 days if no response.  Suzzette Gasparro E. Suzie Portela RN, BSN [facility] Care Management Coordinator [phone]

## 2018-03-13 NOTE — Patient Outreach (Signed)
[facility]) Care Management  03/13/2018  Cloda Zunino [place] [birth year] [id number]  Successful call to the patients daughter, Reggie Pile as specified in referral. Mrs. Allyne Gee was able to provide the patients date of birth as well as address to verify HIPAA. BSW introduced self to Mrs. Sanders and the reason for today's call, indicating the patient had recently been referred for assistance with mobile meals and an in home caregiver.  Mrs. Allyne Gee indicates the patient will be discharged from home health services soon and they would like the patient to receive "whatever her insurance covers". BSW explained that unfortunately, most insurance plans only cover home health services and do not cover ongoing caregivers. Mrs. Allyne Gee indicates the patient does not qualify for Medicaid. BSW began discussing other options such as private pay caregivers, adult day centers, or senior centers for engagement. BSW attempted to narrow down what the patient may benefit from, however Mrs. Allyne Gee indicated she was at work and could not talk long.  Mrs. Allyne Gee is agreeable to BSW referring the patient to [employer] on wheels. Mrs. Allyne Gee is aware there is a waiting list and states the patient has food as she and her sisters are providing support to the patient. Mrs. Allyne Gee is also agreeable to [employer] resources this BSW "feels is appropriate" to the patients home.   BSW has mailed brochures for the following programs: [facility], [employer], [employer]. BSW has also mailed a comprehensible list of home care providers serving [facility]. BSW has also sent the patients referral via e-mail to Casilda Carls, [facility] of [employer] copywriter.  Plan: BSW will contact Mrs. Sanders in the next three weeks to confirm receipt of mailings. BSW will plan to link the patient to desired resources.  Bevelyn Ngo, BSW, CDP [facility] [phone]

## 2018-03-15 DIAGNOSIS — M810 Age-related osteoporosis without current pathological fracture: Secondary | ICD-10-CM | POA: Diagnosis not present

## 2018-03-15 DIAGNOSIS — I2510 Atherosclerotic heart disease of native coronary artery without angina pectoris: Secondary | ICD-10-CM | POA: Diagnosis not present

## 2018-03-15 DIAGNOSIS — I10 Essential (primary) hypertension: Secondary | ICD-10-CM | POA: Diagnosis not present

## 2018-03-15 DIAGNOSIS — S2222XD Fracture of body of sternum, subsequent encounter for fracture with routine healing: Secondary | ICD-10-CM | POA: Diagnosis not present

## 2018-03-15 DIAGNOSIS — Z8673 Personal history of transient ischemic attack (TIA), and cerebral infarction without residual deficits: Secondary | ICD-10-CM | POA: Diagnosis not present

## 2018-03-15 DIAGNOSIS — S2243XD Multiple fractures of ribs, bilateral, subsequent encounter for fracture with routine healing: Secondary | ICD-10-CM | POA: Diagnosis not present

## 2018-03-15 DIAGNOSIS — H8149 Vertigo of central origin, unspecified ear: Secondary | ICD-10-CM | POA: Diagnosis not present

## 2018-03-15 DIAGNOSIS — Z9181 History of falling: Secondary | ICD-10-CM | POA: Diagnosis not present

## 2018-03-15 DIAGNOSIS — F329 Major depressive disorder, single episode, unspecified: Secondary | ICD-10-CM | POA: Diagnosis not present

## 2018-03-19 ENCOUNTER — Other Ambulatory Visit: Payer: Self-pay

## 2018-03-19 NOTE — Patient Outreach (Signed)
[facility]) Care Management  03/19/2018  Pilar JarvisLinda S Murin 04-Jan-1941 [id number]   Care Coordination: Successful telephone encounter to [name], the above patient's daughter who's contact number is listed as patients home number. [facility] RN CM introduced role of [facility] Community Case Management. Daughter questioned referral and RN CM discussed with daughter that [facility]. Daughter states [facility] has already closed patients case and is no longer coming to patients home. Verbal consent obtained. Initial home visit scheduled for Thursday 9/19 at 1000.  Tahja Liao E. Suzie PortelaPayne, RN, BSN [facility] Care Management Coordinator [phone]

## 2018-03-21 ENCOUNTER — Other Ambulatory Visit: Payer: Self-pay

## 2018-03-21 ENCOUNTER — Telehealth: Payer: Self-pay | Admitting: Family Medicine

## 2018-03-21 NOTE — Telephone Encounter (Signed)
Left detail message for verbal. °

## 2018-03-21 NOTE — Telephone Encounter (Signed)
Jill Hancock with home health said they were missing an order for PT starting 8/30 and one for ST start date 9/1.  Please fax to [phone].  Her call back number is [phone]

## 2018-03-21 NOTE — Patient Outreach (Signed)
Jill Hancock) Care Management  Initial Home Visit Assessment 03/21/2018  Jill Hancock 02/11/1941 [id number]  Jill Hancock is an 77 y.o. female  Subjective: Patient reports back pain, chronic in nature, 6/10. "My back always hurts since I fell". Patient states she has had many falls since her stroke 4 years ago.   Patient states each daughter coordinates a piece of her care. "You will have to call Jill Hancock to talk about my medications because she is the one who manages them and fills my box". Daughter has an timed medication dispenser filled by daughter. Daughter keeps all pill bottles.   Jill Hancock, patients daughter, states patient has been diagnosed with mild dementia. States patient  has not yet been declared incompetent and still able to sign consent for [place]. States she and her sisters have full-time jobs, families, and try to care for their mother. They have divided their mothers care into "jobs". Jill Hancock is responsible for medication administration and prescription refills, and Jill Hancock is the coordinator of services in the home. They each take turns for cleaning the patients home, providing meals on weekly rotations. The other sister is responsible for transportation to MD appointments.  Jill Hancock states the biggest stressor is finding assistance with bath aid and meals. States patient had a bath aid through [facility] however she would refuse to let the aids in as she lives in a locked apartment complex and the visitors have to be "buzzed in". Jill Hancock request Jill Hancock be notified of all [facility] appointments so she can inform patient and reinforce time and day to prevent patient "turning away" caregivers.  Objective:   BP 116/68 (BP Location: Left Arm, Patient Position: Sitting, Cuff Size: Normal)   Pulse 72   SpO2 96%   Physical Exam  Constitutional: She is oriented to person, place, and time. She appears well-developed and well-nourished.  Cardiovascular: Normal rate,  regular rhythm, normal heart sounds and intact distal pulses.  Respiratory: Effort normal and breath sounds normal. No respiratory distress.  GI: Soft. Bowel sounds are normal.  Genitourinary:  Genitourinary Comments: Patient states urine is clear yellow with no foul odor. Patient acknowledges urinary incontinence at times and use of depends.  Musculoskeletal: She exhibits edema.  Decreased ROB in back related to previous fall with injury Moderate left lower leg and foot edema that is described as chronic in nature by patient and daughter and told by MD it is related to BP medication.  Neurological: She is alert and oriented to person, place, and time.  Patient has mild memory deficit. She is unable to give complete address, daughters phone numbers, her own phone number, has trouble with daughters name and needs time to process information asked. Patient also has some expressive aphasia related to past stroke.  Skin: Skin is warm and dry.  Psychiatric: She has a normal mood and affect. Her behavior is normal. Judgment and thought content normal.    Encounter Medications:   Outpatient Encounter Medications as of 03/21/2018  Medication Sig Note  . alendronate (FOSAMAX) 70 MG tablet TAKE 1 TABLET EVERY 7 DAYS.  SEE PACKAGE FOR ADDITIONAL INSTRUCTIONS 03/21/2018: Patient now takes on Monday  . amLODipine (NORVASC) 10 MG tablet Take 1 tablet (10 mg total) by mouth daily.   Marland Kitchen aspirin 81 MG tablet Take 1 tablet (81 mg total) by mouth daily.   Marland Kitchen esomeprazole (NEXIUM) 20 MG capsule Take 1 capsule (20 mg total) by mouth daily before breakfast.   . FLUoxetine (PROZAC) 40  MG capsule Take 1 capsule (40 mg total) by mouth daily.   Marland Kitchen lisinopril (PRINIVIL,ZESTRIL) 40 MG tablet Take 1 tablet (40 mg total) by mouth daily.   . meclizine (ANTIVERT) 25 MG tablet Take 1 tablet (25 mg total) by mouth daily as needed for dizziness.   . potassium chloride (K-DUR,KLOR-CON) 10 MEQ tablet Take 1 tablet (10 mEq total) by  mouth daily.   . pravastatin (PRAVACHOL) 20 MG tablet Take 1 tablet (20 mg total) by mouth at bedtime.   Marland Kitchen acetaminophen (TYLENOL) 500 MG tablet Take 500 mg by mouth every 6 (six) hours as needed for mild pain or headache.    . ciprofloxacin (CIPRO) 500 MG tablet Take 1 tablet (500 mg total) by mouth 2 (two) times daily. For 7 days (Patient not taking: Reported on 02/26/2018)   . traMADol (ULTRAM) 50 MG tablet Take 1 tablet (50 mg total) by mouth every 8 (eight) hours as needed for moderate pain (rib fracture). (Patient not taking: Reported on 02/26/2018)    No facility-administered encounter medications on file as of 03/21/2018.     Functional Status:   In your present state of health, do you have any difficulty performing the following activities: 03/21/2018 12/31/2017  Hearing? N N  Vision? N N  Difficulty concentrating or making decisions? Y N  Walking or climbing stairs? Y Y  Dressing or bathing? Y Y  Doing errands, shopping? Jill Hancock  Preparing Food and eating ? Y -  Using the Toilet? N -  In the past six months, have you accidently leaked urine? Y -  Do you have problems with loss of bowel control? N -  Managing your Medications? Y -  Managing your Finances? Y -  Housekeeping or managing your Housekeeping? Y -  Some recent data might be hidden    Fall/Depression Screening:    Fall Risk  03/21/2018 12/05/2017 07/18/2016  Falls in the past year? Yes Yes Yes  Number falls in past yr: 2 or more 2 or more 2 or more  Injury with Fall? Yes No Yes  Comment - - LOWER BACK PAIN  Risk Factor Category  High Fall Risk High Fall Risk High Fall Risk  Comment - - -  Risk for fall due to : History of fall(s);Impaired balance/gait History of fall(s) History of fall(s)  Risk for fall due to: Comment - - -  Follow up Falls evaluation completed;Falls prevention discussed;Education provided - -   PHQ 2/9 Scores 03/21/2018 01/21/2018 12/05/2017 07/18/2016 07/05/2016 06/12/2016 04/10/2016  PHQ - 2 Score 0 '2 6 1 1 '$ 0  0  PHQ- 9 Score - 8 21 - - - -    Assessment: Upon arrival to complex, RN CM buzzed into patients apartment. RN CM gave patient her name and informed patient that Jill Hancock has scheduled an appointment for her. RN CM was buzzed into patients building and patient was pleasantly waiting at her apartment door. Patient is pleasant, well groomed. Apartment is clean, clutter, free with clear walkways to all rooms and living areas. Carpet in living areas and linoleum in kitchen and bath. There are no rugs on floor or pets in home.  Patient observed ambulating with Rolator. Of note, patient has 911 "call button" necklace however she has it attached to her Rolator instead of on her person. She is ambulating barefooted. Steady gait noted. Patient appears to "hunch over". "It hurts to stand up straight". Back brace in use under clothes. When sitting, patient elevates edematous leg/foot on  Rolator.  Discussed with patient and daughter Jill Hancock care plan established at today's visit. Also discussed with patient and daughter Epic documentation stating patient is successfully placed on [employer] on Wheels waiting list and is next in line for services.  Patient has a history of chronic UTIs. Jill Hancock states patient has an appointment on 9/25 with [facility] urology. RN CM reviewed EMMI education with daughter and patient on UTI prevention.  Plan: Follow up phone call in 2 weeks to assess progress towards short term goals. Will discuss needs voiced by daughter Jill Hancock with Daneen Schick, BSW.  THN CM Care Plan Problem One     Most Recent Value  Care Plan Problem One  High Risk for falls  Role Documenting the Problem One  Care Management [place] for Problem One  Active  THN Long Term Goal   over the next 60 days patient will remain free from falls as evidenced by patient reporting and review of EMR  [facility]N Long Term Goal Start Date  03/21/18  [facility] Long Term Goal Met Date  03/21/18  Interventions for Problem  One Long Term Goal  reviewed emmi education on falls with patient, completed home fall risk assessment/safty assessment, discussed changing positions slowly using rolator with all ambulation, tested 911 alert necklace, encourged patien to wear necklace instead of leaving hanging on rolater  THN CM Short Term Goal #1   patient will independenly review EMMI education with daughter within the next 30 days  THN CM Short Term Goal #1 Start Date  03/21/18  Interventions for Short Term Goal #1  reviewed highs of EMMI education with patient, discussed importance of reviewing in depth with daughters, discussed with daughter Jill Hancock that EMMI education will be left in home and encouraged daughter to review with patient  THN CM Short Term Goal #2   Patient will verbalize battery change and proper working function of 911 help now button within the next 2 weeks  THN CM Short Term Goal #2 Start Date  03/21/18  Interventions for Short Term Goal #2  attempted to activate 911 for test, discussed with patient and daughter that button did not work, discussd with patient and daughter need for new batteries ASAP     Lekeisha Arenas E. Rollene Rotunda RN, BSN [facility] Care Management Coordinator [phone]

## 2018-03-25 ENCOUNTER — Other Ambulatory Visit: Payer: Self-pay

## 2018-03-25 NOTE — Patient Outreach (Signed)
[facility]) Care Management  03/25/2018  Pilar JarvisLinda S Sergent 04-30-1941 [id number]  Successful outreach to the patients daughter, [name] on today's date. BSW educated [place] on process of meals on wheels program. [name] understands the patient is the next in line. [name] understands she will receive a call from Casilda CarlsBarbara Clayton when the patient is set to receive meals. BSW discussed anticipated barriers surrounding the patients history of not allowing home health aides into the apartment. BSW explained meals will be delivered daily and the patient will need to "buzz" the volunteers into the apartment building. [name] stated understanding. BSW suggested they leave notes for the patient or provide the meals on wheels volunteer with the patients contact number to call and alert why they are at the door. [name] stated she would make it clear to the patient to allow the volunteers to deliver meals.  [name] is unable to think if other resource needs are present as she is at work and experiencing some caregiver burnout. BSW provided [place] direct contact number and suggested she share this BSW's contact with her siblings. BSW to keep the patient as an active patient for the coming weeks. BSW asked [name] and/or her siblings to contact this BSW if other needs are identified. [name] stated understanding and agreed with this plan.  Bevelyn NgoKendra Lurlean Kernen, BSW, CDP [facility] Care Management Social Worker [phone]

## 2018-03-27 ENCOUNTER — Encounter: Payer: Self-pay | Admitting: Urology

## 2018-03-27 ENCOUNTER — Ambulatory Visit (INDEPENDENT_AMBULATORY_CARE_PROVIDER_SITE_OTHER): Payer: Medicare HMO | Admitting: Urology

## 2018-03-27 VITALS — BP 120/79 | HR 78 | Ht 62.0 in

## 2018-03-27 DIAGNOSIS — R8271 Bacteriuria: Secondary | ICD-10-CM

## 2018-03-27 DIAGNOSIS — R3129 Other microscopic hematuria: Secondary | ICD-10-CM

## 2018-03-27 LAB — URINALYSIS, COMPLETE
BILIRUBIN UA: NEGATIVE
GLUCOSE, UA: NEGATIVE
KETONES UA: NEGATIVE
Nitrite, UA: NEGATIVE
PROTEIN UA: NEGATIVE
Specific Gravity, UA: 1.015 (ref 1.005–1.030)
Urobilinogen, Ur: 0.2 mg/dL (ref 0.2–1.0)
pH, UA: 8 — ABNORMAL HIGH (ref 5.0–7.5)

## 2018-03-27 LAB — MICROSCOPIC EXAMINATION: RBC, UA: NONE SEEN /hpf (ref 0–2)

## 2018-03-27 LAB — BLADDER SCAN AMB NON-IMAGING: Scan Result: 247

## 2018-03-27 NOTE — Progress Notes (Signed)
03/27/2018 10:52 AM   Jill Hancock May 07, 1941 [id number]  Referring provider: Smitty Cords, DO [address] [place], [place] [address]  CC: Recurrent UTIs  HPI: I had the pleasure of seeing Jill Hancock in urology clinic today in consultation for possible recurrent UTIs from Dr. Althea Charon.  She is a 77 year old female with a history of stroke who is here with her daughter today.  On chart review she has only one prior positive culture over the last year, >100K Pseudomonas on June 14 of 2019.  She denies any symptoms of burning, urgency, frequency, however her daughter feels that her personality changes when she has a urinary infection.  She does have baseline urgency and stress incontinence, requiring depends or pads.  There is not a clear association of urinary symptoms and her prior positive culture.  There are no aggravating or alleviating factors.  Duration is over the last 5 months.  Severity is mild.  She denies history of kidney stones.  She denies flank pain or gross hematuria.  PVR 247 cc, however patient had the urge to void but was unable.  She feels this was secondary to being in clinic today.   PMH: Past Medical History:  Diagnosis Date  . Anxiety   . Depression   . Hyperglycemia   . Hypertension   . Hypokalemia   . Osteoporosis   . Stroke [facility]) 02/2012   MRI revealed at least 3 subcentimeter acute infarctions with one area of subacute infarction in widely disparate vascular territories including territory of left cerebellum and around right caudate nucleus along with widespread lacunar infarcts, chronic microvascular ischemic change, and numerous microbleeds suggesting long standing hypertensive cerebrovascular disease.  MRA confirmed intracranial  . Unsteady gait   . Vertigo     Surgical History: Past Surgical History:  Procedure Laterality Date  . BREAST BIOPSY Right    pt not sure when   . CHOLECYSTECTOMY    . VAGINAL HYSTERECTOMY       Allergies:  Allergies  Allergen Reactions  . Demerol [Meperidine] Nausea And Vomiting  . Macrobid [Nitrofurantoin Macrocrystal] Rash  . Penicillins Rash    Has patient had a PCN reaction causing immediate rash, facial/tongue/throat swelling, SOB or lightheadedness with hypotension: Unknown Has patient had a PCN reaction causing severe rash involving mucus membranes or skin necrosis: Unknown Has patient had a PCN reaction that required hospitalization: Unknown Has patient had a PCN reaction occurring within the last 10 years: No If all of the above answers are "NO", then may proceed with Cephalosporin use.     Family History: Family History  Problem Relation Age of Onset  . Heart attack Mother   . Heart disease Mother   . Heart disease Father   . Heart attack Brother        death at age 70  . Stroke Brother        death at 39  . Bladder Cancer Neg Hx   . Kidney cancer Neg Hx     Social History:  reports that she has never smoked. She has never used smokeless tobacco. She reports that she does not drink alcohol or use drugs.  ROS: Please see flowsheet from today's date for complete review of systems.  Physical Exam: BP 120/79 (BP Location: Left Arm, Patient Position: Sitting, Cuff Size: Normal)   Pulse 78   Ht 5\' 2"  (1.575 m)   BMI 24.69 kg/m    Constitutional: Elderly, oriented, No acute distress Cardiovascular: No clubbing,  cyanosis, or edema. Respiratory: Normal respiratory effort, no increased work of breathing. GI: Abdomen is soft, nontender, nondistended, no abdominal masses GU: no CVA tenderness Lymph: No cervical or inguinal lymphadenopathy. Skin: No rashes, bruises or suspicious lesions. Neurologic: Grossly intact, no focal deficits, moving all 4 extremities. Psychiatric: Normal mood and affect.  Laboratory Data: Urinalysis today 6-10 WBCs, 0 RBCs, 0-10 epithelial cells, many bacteria, nitrite negative  Pertinent Imaging: None to review  Assessment  & Plan:   In summary, Jill Hancock is a comorbid 77 year old female here to discuss possible recurrent UTIs.  On review of her chart and discussion of her symptoms, she really has not had true symptomatic UTIs.  It is challenging to tell if this is asymptomatic bacteriuria with normal aging, or if she truly is having confusion or personality changes associated with her positive culture.  We discussed at length that it is normal for women to have bacteria in the urine, and we do not recommend treating this with antibiotics if she is not having lower urinary symptoms including dysuria, frequency, pelvic pain, or fever.  We also discussed basic prevention strategies including adequate hydration, timed voiding every 2 hours during the day, cranberry supplementation, and perineal hygiene.  I am hesitant to start any medications for overactive bladder with her elevated PVR today, and baseline confusion and frailty.  RTC 3 months for PVR   Return in about 3 months (around 06/26/2018) for PVR.  Sondra Come, MD  [facility] [address], [address] [place], [place] [address] [phone]

## 2018-03-27 NOTE — Patient Instructions (Signed)
Asymptomatic Bacteriuria Asymptomatic bacteriuria is the presence of a large number of bacteria in the urine without the usual symptoms of burning or frequent urination. What are the causes? This condition is caused by an increase in bacteria in the urine. This increase can be caused by:  Bacteria entering the urinary tract, such as during sex.  A blockage in the urinary tract, such as from kidney stones or a tumor.  Bladder problems that prevent the bladder from emptying.  What increases the risk? You are more likely to develop this condition if:  You have diabetes mellitus.  You are an elderly adult, especially if you are also in a long-term care facility.  You are pregnant and in the first trimester.  You have kidney stones.  You are female.  You have had a kidney transplant.  You have a leaky kidney tube valve (reflux).  You had a urinary catheter for a long period of time.  What are the signs or symptoms? There are no symptoms of this condition. How is this diagnosed? This condition is diagnosed with a urine test. Because this condition does not cause symptoms, it is usually diagnosed when a urine sample is taken to treat or diagnose another condition, such as pregnancy or kidney problems. Most women who are in their first trimester of pregnancy are screened for asymptomatic bacteriuria. How is this treated? Usually, treatment is not needed for this condition. Treating the condition can lead to other problems, such as a yeast infection or the growth of bacteria that do not respond to treatment (antibiotic-resistant bacteria). Some people, such as pregnant women and people with kidney transplants, do need treatment with antibiotic medicines to prevent kidney infection (pyelonephritis). In pregnant women, kidney infection can lead to premature labor, fetal growth restriction, or newborn death. Follow these instructions at home: Medicines  Take over-the-counter and  prescription medicines only as told by your health care provider.  If you were prescribed an antibiotic medicine, take it as told by your health care provider. Do not stop taking the antibiotic even if you start to feel better. General instructions  Monitor your condition for any changes.  Drink enough fluid to keep your urine clear or pale yellow.  Go to the bathroom more often to keep your bladder empty.  If you are female, keep the area around your vagina and rectum clean. Wipe yourself from front to back after urinating.  Keep all follow-up visits as told by your health care provider. This is important. Contact a health care provider if:  You notice any new symptoms, such as back pain or burning while urinating. Get help right away if:  You develop signs of an infection such as: ? A burning sensation when you urinate. ? Have pain when you urinate. ? Develop an intense need to urinate. ? Urinating more frequently. ? Back pain or pelvic pain. ? Fever or chills.  You have blood in your urine.  Your urine becomes discolored or cloudy.  Your urine smells bad.  You have severe pain that cannot be controlled with medicine. Summary  Asymptomatic bacteriuria is the presence of a large number of bacteria in the urine without the usual symptoms of burning or frequent urination.  Usually, treatment is not needed for this condition. Treating the condition can lead to other problems, such as too much yeast and the growth of antibiotic-resistant bacteria.  Some people, such as pregnant women and people with kidney transplants, do need treatment with antibiotic medicines to prevent   kidney infection (pyelonephritis).  If you were prescribed an antibiotic medicine, take it as told by your health care provider. Do not stop taking the antibiotic even if you start to feel better. This information is not intended to replace advice given to you by your health care provider. Make sure you  discuss any questions you have with your health care provider. Document Released: 06/19/2005 Document Revised: 06/13/2016 Document Reviewed: 06/13/2016 Elsevier Interactive Patient Education  2017 Elsevier Inc.  

## 2018-04-02 ENCOUNTER — Ambulatory Visit: Payer: Self-pay

## 2018-04-04 ENCOUNTER — Other Ambulatory Visit: Payer: Self-pay

## 2018-04-04 NOTE — Patient Outreach (Addendum)
[facility]) Care Management  04/04/2018  Jill Hancock 25-Apr-1941 [id number]  Telephone Assessment/Care Coordination: Unsuccessful telephone encounter to Jill Hancock, daughter to the above patient. It was previously established that Jill Hancock would be contact for patient as daughters handle all of patients "affairs". Jill Hancock would not previously give RN CM patients phone number. Patient did not know her own phone number to give RN CM during initial home visit. Daughters, including Jill Hancock, listed on [facility] written consent. RN CM attempting to follow up on patients progress towards short term goals and to establish time and date of 30 day follow-up home visit. Left HIPAA compliant VM message requesting call back.   Plan: Will try to contact daughter tomorrow.  Addendum 04/05/2018@1544 . After speaking with Jill Hancock regarding her discussion today with above patients daughter Jill Ball, RN CM will await phone call from [name]. If no return call, will attempt to contact Jill Hancock in 3-4 business days since Jill Hancock indicates Jill Hancock is the main contact for scheduling patients appointments.  Jill Hancock E. Suzie Portela RN, BSN [facility] Care Management Coordinator [phone]

## 2018-04-05 ENCOUNTER — Other Ambulatory Visit: Payer: Self-pay

## 2018-04-05 NOTE — Patient Outreach (Signed)
[facility]) Care Management  04/05/2018  Jill Hancock June 01, 1941 [id number]  Incoming call from the patients daughter, Jill Hancock on today's date. Jill Hancock is able to provide patients HIPAA identifiers. Jill Hancock states she is calling to follow up on the status of the patients meals on wheels referral stating her sister was told weeks ago the patient was first on the waiting list but services have yet to start.  BSW explained the process of the waiting list for meals on wheels. BSW further explained that unfortunately there is no way to determine how long a patient may be on the waiting list prior to receiving meals. Jill Hancock stated understanding. BSW provided [name] with [employer]. BSW encouraged Jill Hancock to contact [place] with any questions as well as to discuss the possibility of privately paying for meals in order to receive services sooner. Jill Hancock stated understanding.  During today's call, Jill Hancock indicated the patients other daughter [name] is responsible for handling all calls and coordinating the patients care. Jill Hancock also stated "but she is only available 7:30 - 8:00 am or after 5:00 pm". Jill Hancock also inquired when [facility] RNCM, Yvone Neu would visit the patient again. BSW explained to [name] that [facility] Care Management hours are 8:30 - 5:00 pm which is when calls are normally placed unless otherwise arranged with the care manager. BSW also informed Jill Hancock [name] had placed a call to [place] on 10/3 and left a voice message requesting a return call. BSW provided [name] with [name]'s contact number. Jill Hancock stated she would return [name]'s call "today".  BSW to perform a discipline closure as no other social work needs have been identified. BSW spoke with [facility] RNCM to update on today's call as well as the discipline closure.  Bevelyn Ngo, BSW, CDP [facility] [phone]

## 2018-04-08 ENCOUNTER — Other Ambulatory Visit: Payer: Self-pay

## 2018-04-08 NOTE — Patient Outreach (Signed)
[facility]) Care Management  04/08/2018  Denia Mcvicar [place] September 01, 1940 [id number]  Care Coordination: Successful telephone encounter to Reggie Pile, daughter of the above patient. RN CM received VM message over weekend requesting call back from Ms. Sanders. RN CM had been unsuccessful establishing contact last week to discuss patients progress toward short term goal and to set up appointment for next in home visit.  Ms. Allyne Gee was unaware that batteries needed changing in patients emergency 911 button. Her sister Zella Ball had not made her aware. She was also unaware that RN CM had left EMMI education in the home for patient to review with daughters. Ms. Allyne Gee was informed that RN CM had left instructions with her sister Zella Ball to review education again with patient and to let other sisters know information was left in home. Ms. Allyne Gee will discuss this information with [name] and let RN CM know if education was review and if batteries have been changed.  Ms. Allyne Gee cut the conversation short stating I am very busy at work and if you want to talk it will have to be after 5 or before 8. RN CM again reviewed with daughter [facility] CM business  hours of 830 to 5 but would be happy to schedule appointment if necessary. Daughter reluctantly gave RN CM patients home number for additional contact but still prefers to be initial contact for appointment coordination.  Prior to end of conversation RN CM scheduled 30 day home visit for Friday, October 18 at 10:00. Will assess progression towards goals at that time as assess further needs for Community Cases Management.  Laurina Fischl E. Suzie Portela RN, BSN [facility] Care Management Coordinator [phone]

## 2018-04-10 ENCOUNTER — Encounter: Payer: Self-pay | Admitting: Family Medicine

## 2018-04-10 ENCOUNTER — Ambulatory Visit (INDEPENDENT_AMBULATORY_CARE_PROVIDER_SITE_OTHER): Payer: Medicare HMO | Admitting: Family Medicine

## 2018-04-10 VITALS — BP 101/52 | HR 79 | Temp 98.6°F | Resp 16 | Ht 62.0 in | Wt 158.0 lb

## 2018-04-10 DIAGNOSIS — E876 Hypokalemia: Secondary | ICD-10-CM

## 2018-04-10 DIAGNOSIS — F339 Major depressive disorder, recurrent, unspecified: Secondary | ICD-10-CM | POA: Diagnosis not present

## 2018-04-10 DIAGNOSIS — J019 Acute sinusitis, unspecified: Secondary | ICD-10-CM | POA: Diagnosis not present

## 2018-04-10 DIAGNOSIS — M1711 Unilateral primary osteoarthritis, right knee: Secondary | ICD-10-CM

## 2018-04-10 MED ORDER — FLUOXETINE HCL 40 MG PO CAPS: 40.0000 mg | ORAL_CAPSULE | Freq: Every day | ORAL | 3 refills | Status: DC

## 2018-04-10 MED ORDER — IPRATROPIUM BROMIDE 0.06 % NA SOLN: 2.0000 | Freq: Four times a day (QID) | NASAL | 0 refills | Status: DC

## 2018-04-10 MED ORDER — MELOXICAM 15 MG PO TABS: 15.0000 mg | ORAL_TABLET | Freq: Every day | ORAL | 1 refills | Status: DC

## 2018-04-10 MED ORDER — AZITHROMYCIN 250 MG PO TABS: ORAL_TABLET | ORAL | 0 refills | Status: DC

## 2018-04-10 MED ORDER — BENZONATATE 100 MG PO CAPS: 100.0000 mg | ORAL_CAPSULE | Freq: Three times a day (TID) | ORAL | 0 refills | Status: DC | PRN

## 2018-04-10 MED ORDER — POTASSIUM CHLORIDE CRYS ER 10 MEQ PO TBCR: 10.0000 meq | EXTENDED_RELEASE_TABLET | Freq: Every day | ORAL | 3 refills | Status: DC

## 2018-04-10 NOTE — Progress Notes (Signed)
Subjective:    Patient ID: Jill Hancock, female    DOB: 13-Nov-1940, 77 y.o.   MRN: [id number]  Jill Hancock is a 77 y.o. female presenting on 04/10/2018 for Anxiety and Cough (onset 2 days chest congestion)  History provided from [place], daughter, today, patient has history of dementia.  HPI   URI Bronchitis Reports recent cough for past few days, no sick contacts. Seems to worsen without improvement. Productive cough and some sinus drainage thicker. - She had Flu shot 2 weeks ago today, at [facility] Denies fever chills sweats  CHRONIC HTN: Outside BP readings stable, some lower readings. Current Meds -Lisinopril 40mg  daily, Amlodipine 5mg  daily - Needs potassium refilled, daily Reports good compliance, took meds today. Tolerating well, w/o complaints.  Chronic R Knee Pain / Osteoarthritis R knee, s/p prior surgery Reports concern with persistent ache and pain in R Knee, asking about trial of meloxicam, prior Aleve OTC ineffective, lower dose though. Has tried injections from [facility] already limited benefit Aleve not helping  Vascular Dementia / Depression Reviewed history of CVA and secondary affect on cognition. Previously followed by Neurology, she has some behavioral episodes. Currently stable has previously had home health follow-up in past through [facility] had issues with fall. Now declines recurrent HH intervention. Family caregivers primarily managing at this time - Regarding mood, previously dose inc from 20 to 40mg , and she has done well.  Health Maintenance: UTD Flu Vaccine 03/27/18  Depression screen [facility] 2/9 04/10/2018 03/21/2018 03/05/2018  Decreased Interest 1 0 (No Data)  Down, Depressed, Hopeless 0 0 -  PHQ - 2 Score 1 0 -  Altered sleeping 0 - -  Tired, decreased energy 2 - -  Change in appetite 0 - -  Feeling bad or failure about yourself  2 - -  Trouble concentrating 0 - -  Moving slowly or fidgety/restless 0 - -  Suicidal thoughts 0 - -    PHQ-9 Score 5 - -  Difficult doing work/chores Not difficult at all - -  Some recent data might be hidden   GAD 7 : Generalized Anxiety Score 04/10/2018  Nervous, Anxious, on Edge 3  Control/stop worrying 3  Worry too much - different things 0  Trouble relaxing 2  Restless 0  Easily annoyed or irritable 3  Afraid - awful might happen 0  Total GAD 7 Score 11  Anxiety Difficulty Not difficult at all     Social History   Tobacco Use  . Smoking status: Never Smoker  . Smokeless tobacco: Never Used  Substance Use Topics  . Alcohol use: No    Alcohol/week: 0.0 standard drinks  . Drug use: No    Review of Systems Per HPI unless specifically indicated above     Objective:    BP (!) 101/52   Pulse 79   Temp 98.6 F (37 C)   Resp 16   Ht 5\' 2"  (1.575 m)   Wt 158 lb (71.7 kg)   SpO2 99%   BMI 28.90 kg/m   Wt Readings from Last 3 Encounters:  04/10/18 158 lb (71.7 kg)  02/25/18 135 lb (61.2 kg)  01/21/18 156 lb (70.8 kg)    Physical Exam  Constitutional: She is oriented to person, place, and time. She appears well-developed and well-nourished. No distress.  Well-appearing 77 year old female, comfortable, cooperative  HENT:  Head: Normocephalic and atraumatic.  Mouth/Throat: Oropharynx is clear and moist.  Eyes: Conjunctivae are normal. Right eye exhibits no  discharge. Left eye exhibits no discharge.  Cardiovascular: Normal rate and intact distal pulses.  Pulmonary/Chest: Effort normal.  Abdominal: Soft. She exhibits no distension. There is no tenderness.  Musculoskeletal: She exhibits no edema (L>R trace to +1 edema, non pitting, non tender, no erythema).  Requires assistance to stand and ambulate, has walker  Neurological: She is alert and oriented to person, place, and time.  Skin: Skin is warm and dry. No rash noted. She is not diaphoretic. No erythema.  Varicose veins bilateral lower extremity  Psychiatric: She has a normal mood and affect. Her behavior is  normal.  Well groomed, good eye contact. Some reduced speech amount and frequently looks to her daughter to answer questions, but when provides responses she has appropriate speech and normal thoughts. Occasionally difficult word finding or recall otherwise has intact memory  Nursing note and vitals reviewed.  Results for orders placed or performed in visit on 03/27/18  Microscopic Examination  Result Value Ref Range   WBC, UA 6-10 (A) 0 - 5 /hpf   RBC, UA None seen 0 - 2 /hpf   Epithelial Cells (non renal) 0-10 0 - 10 /hpf   Bacteria, UA Many (A) None seen/Few  Urinalysis, Complete  Result Value Ref Range   Specific Gravity, UA 1.015 1.005 - 1.030   pH, UA 8.0 (H) 5.0 - 7.5   Color, UA Yellow Yellow   Appearance Ur Clear Clear   Leukocytes, UA 2+ (A) Negative   Protein, UA Negative Negative/Trace   Glucose, UA Negative Negative   Ketones, UA Negative Negative   RBC, UA Trace (A) Negative   Bilirubin, UA Negative Negative   Urobilinogen, Ur 0.2 0.2 - 1.0 mg/dL   Nitrite, UA Negative Negative   Microscopic Examination See below:   Bladder Scan (Post Void Residual) in office  Result Value Ref Range   Scan Result 247       Assessment & Plan:   Problem List Items Addressed This Visit    Major depression, recurrent, chronic (HCC) - Primary    Improved PHQ Seems better control on higher dose Fluoxetine 40mg  daily      Relevant Medications   FLUoxetine (PROZAC) 40 MG capsule   Primary osteoarthritis of right knee    Chronic R knee pain, OA/DJD See prior note  Trial Meloxicam 15mg  daily 2-4 week then PRN, Stop Aleve Improve Tylenol dosing, RICE therapy May return to ortho if limited results Also consider topical diclofenac      Relevant Medications   meloxicam (MOBIC) 15 MG tablet    Other Visit Diagnoses    Hypokalemia       Relevant Medications   potassium chloride (K-DUR,KLOR-CON) 10 MEQ tablet   Acute rhinosinusitis       Relevant Medications   azithromycin  (ZITHROMAX Z-PAK) 250 MG tablet   benzonatate (TESSALON) 100 MG capsule   ipratropium (ATROVENT) 0.06 % nasal spray      Consistent with recently worsening bronchitis in setting of likely viral URI vs allergic rhinitis component.  - Afebrile, no focal signs of infection (not consistent with pneumonia by history or exam), no evidence sinusitis.   Plan: 1. Reassurance, continue supportive measures  - Start Atrovent nasal spray decongestant 2 sprays in each nostril up to 4 times daily for 7 days - Start Tessalon Perls take 1 capsule up to 3 times a day as needed for cough - Given limited follow-up access and upcoming weekend - If not improved within 48 hours - consider  filling printed backup plan antibiotic Azithromycin Z-pak dosing 500mg  then 250mg  daily x 4 days   Meds ordered this encounter  Medications  . potassium chloride (K-DUR,KLOR-CON) 10 MEQ tablet    Sig: Take 1 tablet (10 mEq total) by mouth daily.    Dispense:  90 tablet    Refill:  3  . FLUoxetine (PROZAC) 40 MG capsule    Sig: Take 1 capsule (40 mg total) by mouth daily.    Dispense:  90 capsule    Refill:  3  . meloxicam (MOBIC) 15 MG tablet    Sig: Take 1 tablet (15 mg total) by mouth daily.    Dispense:  90 tablet    Refill:  1  . azithromycin (ZITHROMAX Z-PAK) 250 MG tablet    Sig: Take 2 tabs (500mg  total) on Day 1. Take 1 tab (250mg ) daily for next 4 days.    Dispense:  6 tablet    Refill:  0  . benzonatate (TESSALON) 100 MG capsule    Sig: Take 1 capsule (100 mg total) by mouth 3 (three) times daily as needed for cough.    Dispense:  30 capsule    Refill:  0  . ipratropium (ATROVENT) 0.06 % nasal spray    Sig: Place 2 sprays into both nostrils 4 (four) times daily. For up to 5-7 days then stop.    Dispense:  15 mL    Refill:  0     Follow up plan: Return in about 3 months (around 07/11/2018) for Annual Physical.  Future labs ordered for 07/23/18  Saralyn Pilar, DO [facility] 04/10/2018, 11:05 AM

## 2018-04-10 NOTE — Patient Instructions (Addendum)
Thank you for coming to the office today.  Please call your Medicare Insurance Plan to speak with benefits and ask what the cost and coverage is for a "Annual Physical" (performed by your doctor) - Due to changes with Medicare and the Medicare advantage plans, we need to confirm this BEFORE we perform a physical and send out the charge. - All Medicare plans should cover an "Annual Medicare Wellness" (performed by a nurse health advisor, not doctor) - this is FREE and can be done once a year  ------------------------------------------  For cough / sinus symptoms  Start Tessalon Perls take 1 capsule up to 3 times a day as needed for cough  Start Atrovent nasal spray decongestant 2 sprays in each nostril up to 4 times daily for 7 days  Use OTC remedy and plenty of hydration as needed  If not improved by Friday / Monday can go fill backup plan antibiotic Z-pak  -----  For arthritis  Start Meloxicam 15mg  daily for 2-4 weeks for anti inflammatory. Do not take ibuprofen, aleve, naproxen while on this.  Recommend to start taking Tylenol Extra Strength 500mg  tabs - take 1 to 2 tabs per dose (max 1000mg ) every 6-8 hours for pain (take regularly, don't skip a dose for next 7 days), max 24 hour daily dose is 6 tablets or 3000mg . In the future you can repeat the same everyday Tylenol course for 1-2 weeks at a time.   ------  Continue Fluoxetine 40mg  daily for now - seems to help  ------  In future for knee we can try topical anti inflammatory  Monitor BP if you can - for right now keep Amlodipine 10mg  daily, in future if BP too low - can cut in half. Swelling from this med is fine.  DUE for FASTING BLOOD WORK (no food or drink after midnight before the lab appointment, only water or coffee without cream/sugar on the morning of)  SCHEDULE "Lab Only" visit in the morning at the clinic for lab draw in 3 MONTHS   - Make sure Lab Only appointment is at about 1 week before your next appointment,  so that results will be available  For Lab Results, once available within 2-3 days of blood draw, you can can log in to MyChart online to view your results and a brief explanation. Also, we can discuss results at next follow-up visit.   Please schedule a Follow-up Appointment to: Return in about 3 months (around 07/11/2018) for Annual Physical.  If you have any other questions or concerns, please feel free to call the office or send a message through MyChart. You may also schedule an earlier appointment if necessary.  Additionally, you may be receiving a survey about your experience at our office within a few days to 1 week by e-mail or mail. We value your feedback.  Saralyn Pilar, DO [facility], [place]

## 2018-04-11 ENCOUNTER — Other Ambulatory Visit: Payer: Self-pay | Admitting: Family Medicine

## 2018-04-11 DIAGNOSIS — I6930 Unspecified sequelae of cerebral infarction: Secondary | ICD-10-CM

## 2018-04-11 DIAGNOSIS — R739 Hyperglycemia, unspecified: Secondary | ICD-10-CM

## 2018-04-11 DIAGNOSIS — M1711 Unilateral primary osteoarthritis, right knee: Secondary | ICD-10-CM

## 2018-04-11 DIAGNOSIS — E559 Vitamin D deficiency, unspecified: Secondary | ICD-10-CM

## 2018-04-11 DIAGNOSIS — F339 Major depressive disorder, recurrent, unspecified: Secondary | ICD-10-CM

## 2018-04-11 DIAGNOSIS — F01518 Vascular dementia, unspecified severity, with other behavioral disturbance: Secondary | ICD-10-CM

## 2018-04-11 DIAGNOSIS — Z0000 Encounter for general adult medical examination without abnormal findings: Secondary | ICD-10-CM

## 2018-04-11 DIAGNOSIS — K219 Gastro-esophageal reflux disease without esophagitis: Secondary | ICD-10-CM

## 2018-04-11 DIAGNOSIS — I10 Essential (primary) hypertension: Secondary | ICD-10-CM

## 2018-04-11 DIAGNOSIS — E7849 Other hyperlipidemia: Secondary | ICD-10-CM

## 2018-04-11 DIAGNOSIS — F0151 Vascular dementia with behavioral disturbance: Secondary | ICD-10-CM

## 2018-04-11 NOTE — Assessment & Plan Note (Signed)
Chronic R knee pain, OA/DJD See prior note  Trial Meloxicam 15mg  daily 2-4 week then PRN, Stop Aleve Improve Tylenol dosing, RICE therapy May return to ortho if limited results Also consider topical diclofenac

## 2018-04-11 NOTE — Assessment & Plan Note (Signed)
Improved PHQ Seems better control on higher dose Fluoxetine 40mg  daily

## 2018-04-15 ENCOUNTER — Ambulatory Visit: Payer: Self-pay

## 2018-04-19 ENCOUNTER — Other Ambulatory Visit: Payer: Self-pay

## 2018-04-19 NOTE — Patient Outreach (Addendum)
[facility]) Care Management  04/19/2018  Jill Hancock 12-Aug-1940 [id number]  Care Coordination/Telephone Assessment/Case Closure:  At 0909 RN CM called to remind patient of 10:00 in home appointment. No answer. Appointment was previously scheduled with daughter Jill Hancock on 04/08/18. RN CM called Jill Hancock as patient did not answer. Jill Hancock requested to call RN CM back after she located patient. Jill Hancock called back to say that her sister Jill Hancock had taken patient to a hair and pedicure appointment. RN CM discussed with Jill Hancock that today's visit was to be the discharge visit if no other needs were identified. RN CM ask Jill Hancock if patient and sister did not remember that RN CM was coming out today". Jill Hancock stated "apparently not". Jill Hancock stated "All I need to know is if you are coming out because I need to get off the phone, I am at work". When RN CM stated "no" call was disconnected by daughter.  At 0941, RN CM received an incoming call from Jill Hancock, patients daughter stating "I am just trying to figure out what is going on because mom should not be scheduling her own appointments". Jill Hancock states that [place] called her and said "RN CM called mom and mom was not home". Jill Hancock states appointment for patient with RN CM was never put into the "calendar". Jill Hancock expressed significant frustration with sisters and caregiver situation. Patients care is divided up between the three siblings with [facility] scheduling all [facility] appointments, Jill Hancock providing transportation to all MD and hair appointments, and Jill Hancock takes care of medications. Jill Hancock admits to very poor communication between [place] and sisters. There has also been lack of communication between Jill Hancock (with whom RN CM spent a lot of time discussing patients goals with) and the other sisters. Jill Hancock is unsure if patients battery on her "life alert" has been changed and states she has not seen emmi education left in home. RN CM discussed that this communication has  unfortunately made it very difficult to connect with patient. Also that all calls have to go through [employer] who unfortunately is not able to take calls during business hours and does not return calls or VM after hours.  Per Jill Hancock, patient has not sustained a fall in the last 30 days. Patient is now taking Meloxicam which is helping with patients knee pain and allowing her to be more active at home. She is able to take the trash out some days. Discussed with Jill Hancock role of RN CM. Jill Hancock feels patient has no additional medical needs that need addressing. Jill Hancock verbalizes patients behaviors as a stressor. States patient called 911 three weeks ago because she saw "suspicious people" sitting in the gazebo. Jill Hancock states with the police came, patient became irate and her behavior was such that the police asked if they could call APS. APS called daughter Jill Hancock and stated they had closed the case because there was not enough evidence that the patient was a danger to herself or others. Jill Hancock states that this is patients "normal behavior/jpersonality and PCP is aware" Jill Hancock, BSW has addressed social needs including meals on wheels. Jill Hancock and sisters will continue to work with PCP to address patients medical and social needs as they arise.   Jill Hancock will make sure battery in life alert is changed although states patient IS NOT wearing it.   Plan: Case Closure related to difficulty maintaining contact with patient and difficulty communicating with caregivers. Also, per daughter no other needs identified for Community Case Management RN.  Jill Milson E. Suzie Portela,  RN, BSN [facility] Care Management Coordinator [phone]

## 2018-04-24 ENCOUNTER — Ambulatory Visit: Payer: Self-pay | Admitting: Family Medicine

## 2018-05-28 ENCOUNTER — Other Ambulatory Visit: Payer: Self-pay

## 2018-05-28 ENCOUNTER — Other Ambulatory Visit: Payer: Self-pay | Admitting: Family Medicine

## 2018-05-28 DIAGNOSIS — E876 Hypokalemia: Secondary | ICD-10-CM

## 2018-05-28 DIAGNOSIS — F339 Major depressive disorder, recurrent, unspecified: Secondary | ICD-10-CM

## 2018-05-28 MED ORDER — POTASSIUM CHLORIDE CRYS ER 10 MEQ PO TBCR: 10.0000 meq | EXTENDED_RELEASE_TABLET | Freq: Every day | ORAL | 3 refills | Status: DC

## 2018-05-28 MED ORDER — FLUOXETINE HCL 40 MG PO CAPS: 40.0000 mg | ORAL_CAPSULE | Freq: Every day | ORAL | 3 refills | Status: DC

## 2018-05-28 NOTE — Telephone Encounter (Signed)
Pt called requesting refill on prozac, potassium 10 meg [facility] Pt call back # is  [phone]

## 2018-06-19 ENCOUNTER — Ambulatory Visit: Payer: Self-pay | Admitting: Urology

## 2018-07-22 ENCOUNTER — Other Ambulatory Visit: Payer: Self-pay

## 2018-07-22 ENCOUNTER — Other Ambulatory Visit: Payer: Medicare HMO

## 2018-07-22 DIAGNOSIS — E559 Vitamin D deficiency, unspecified: Secondary | ICD-10-CM

## 2018-07-22 DIAGNOSIS — Z0000 Encounter for general adult medical examination without abnormal findings: Secondary | ICD-10-CM

## 2018-07-22 DIAGNOSIS — E7849 Other hyperlipidemia: Secondary | ICD-10-CM

## 2018-07-22 DIAGNOSIS — F339 Major depressive disorder, recurrent, unspecified: Secondary | ICD-10-CM | POA: Diagnosis not present

## 2018-07-22 DIAGNOSIS — F0151 Vascular dementia with behavioral disturbance: Secondary | ICD-10-CM

## 2018-07-22 DIAGNOSIS — I10 Essential (primary) hypertension: Secondary | ICD-10-CM

## 2018-07-22 DIAGNOSIS — R739 Hyperglycemia, unspecified: Secondary | ICD-10-CM

## 2018-07-22 DIAGNOSIS — F01518 Vascular dementia, unspecified severity, with other behavioral disturbance: Secondary | ICD-10-CM

## 2018-07-22 DIAGNOSIS — D519 Vitamin B12 deficiency anemia, unspecified: Secondary | ICD-10-CM | POA: Diagnosis not present

## 2018-07-23 ENCOUNTER — Other Ambulatory Visit: Payer: Self-pay

## 2018-07-23 LAB — CBC WITH DIFFERENTIAL/PLATELET
Absolute Monocytes: 613 cells/uL (ref 200–950)
BASOS ABS: 50 {cells}/uL (ref 0–200)
Basophils Relative: 0.6 %
EOS PCT: 3.7 %
Eosinophils Absolute: 311 cells/uL (ref 15–500)
HCT: 36.3 % (ref 35.0–45.0)
Hemoglobin: 12.2 g/dL (ref 11.7–15.5)
Lymphs Abs: 2520 cells/uL (ref 850–3900)
MCH: 29.3 pg (ref 27.0–33.0)
MCHC: 33.6 g/dL (ref 32.0–36.0)
MCV: 87.1 fL (ref 80.0–100.0)
MONOS PCT: 7.3 %
MPV: 9.7 fL (ref 7.5–12.5)
NEUTROS PCT: 58.4 %
Neutro Abs: 4906 cells/uL (ref 1500–7800)
PLATELETS: 368 10*3/uL (ref 140–400)
RBC: 4.17 10*6/uL (ref 3.80–5.10)
RDW: 13.5 % (ref 11.0–15.0)
TOTAL LYMPHOCYTE: 30 %
WBC: 8.4 10*3/uL (ref 3.8–10.8)

## 2018-07-23 LAB — HEMOGLOBIN A1C
EAG (MMOL/L): 6.6 (calc)
Hgb A1c MFr Bld: 5.8 % of total Hgb — ABNORMAL HIGH (ref <5.7)
MEAN PLASMA GLUCOSE: 120 (calc)

## 2018-07-23 LAB — COMPLETE METABOLIC PANEL WITH GFR
AG RATIO: 1.3 (calc) (ref 1.0–2.5)
ALBUMIN MSPROF: 4 g/dL (ref 3.6–5.1)
ALKALINE PHOSPHATASE (APISO): 49 U/L (ref 33–130)
ALT: 24 U/L (ref 6–29)
AST: 28 U/L (ref 10–35)
BILIRUBIN TOTAL: 0.4 mg/dL (ref 0.2–1.2)
BUN: 14 mg/dL (ref 7–25)
CHLORIDE: 102 mmol/L (ref 98–110)
CO2: 28 mmol/L (ref 20–32)
Calcium: 9.3 mg/dL (ref 8.6–10.4)
Creat: 0.92 mg/dL (ref 0.60–0.93)
GFR, EST AFRICAN AMERICAN: 69 mL/min/{1.73_m2} (ref >=60)
GFR, Est Non African American: 60 mL/min/{1.73_m2} (ref >=60)
Globulin: 3.1 g/dL (calc) (ref 1.9–3.7)
Glucose, Bld: 98 mg/dL (ref 65–99)
POTASSIUM: 4.4 mmol/L (ref 3.5–5.3)
Sodium: 142 mmol/L (ref 135–146)
TOTAL PROTEIN: 7.1 g/dL (ref 6.1–8.1)

## 2018-07-23 LAB — LIPID PANEL
CHOLESTEROL: 171 mg/dL (ref <200)
HDL: 78 mg/dL (ref >50)
LDL Cholesterol (Calc): 73 mg/dL (calc)
NON-HDL CHOLESTEROL (CALC): 93 mg/dL (ref <130)
Total CHOL/HDL Ratio: 2.2 (calc) (ref <5.0)
Triglycerides: 110 mg/dL (ref <150)

## 2018-07-23 LAB — VITAMIN B12: Vitamin B-12: 193 pg/mL — ABNORMAL LOW (ref 200–1100)

## 2018-07-23 LAB — VITAMIN D 25 HYDROXY (VIT D DEFICIENCY, FRACTURES): VIT D 25 HYDROXY: 17 ng/mL — AB (ref 30–100)

## 2018-07-25 ENCOUNTER — Encounter: Payer: Self-pay | Admitting: Family Medicine

## 2018-07-25 DIAGNOSIS — R7303 Prediabetes: Secondary | ICD-10-CM | POA: Insufficient documentation

## 2018-07-29 ENCOUNTER — Telehealth: Payer: Self-pay | Admitting: Family Medicine

## 2018-07-29 DIAGNOSIS — E559 Vitamin D deficiency, unspecified: Secondary | ICD-10-CM

## 2018-07-29 MED ORDER — VITAMIN D3 125 MCG (5000 UT) PO CAPS: 5000.0000 [IU] | ORAL_CAPSULE | Freq: Every day | ORAL | 2 refills | Status: DC

## 2018-07-29 NOTE — Telephone Encounter (Signed)
[name], pt's daughter called for lab results [phone]

## 2018-07-29 NOTE — Telephone Encounter (Signed)
Patient had an Annual Physical apt scheduled for tomorrow 07/30/18, but this was cancelled on 07/29/18 and it seems it was re-scheduled for 10/01/18.  I would like to review her lab results and recommendations in person.  I prefer if she could have been seen at her apt 1/28 or maybe can be re-scheduled for NEXT week instead of 3/31?  Otherwise, if she cannot come in until March, then I would send rx Vitamin D3 5,000 daily for 3 months, and also I would recommend Vitamin B12 supplement can start with oral dose 1,000 mcg daily.  We can consider Vitamin B12 injection series in the future - discuss at next visit.  Saralyn PilarAlexander Karamalegos, DO [facility] 07/29/2018, 3:28 PM

## 2018-07-29 NOTE — Telephone Encounter (Signed)
Called patients daughter (she is on [place]) and she is asking if you wanted to do anything about her low results.  3. Vitamin D 17, low.  4. Vitamin B12 - 193, low.  Please advise.  Daughter said if she did not pick up phone to leave a detail message.

## 2018-07-29 NOTE — Telephone Encounter (Signed)
Left message for daughter to call back and make appointment sooner.

## 2018-07-30 ENCOUNTER — Encounter: Payer: Self-pay | Admitting: Family Medicine

## 2018-07-30 NOTE — Telephone Encounter (Signed)
Left message

## 2018-08-06 ENCOUNTER — Telehealth: Payer: Self-pay

## 2018-08-06 NOTE — Telephone Encounter (Signed)
As per daughter patient had fall this morning denies HA, nausea has some throwing up but daughter doesn't know if it is before fall or after fall thinks may be it is her acid reflux. She doesn't have no bruce or she does not feel  knot advise to observe for 24 hours if she become symptomatic then schedule an office visit, ER or UC. Dr. Kirtland BouchardK is aware regarding her fall.

## 2018-08-16 ENCOUNTER — Other Ambulatory Visit: Payer: Self-pay | Admitting: Family Medicine

## 2018-08-16 DIAGNOSIS — I10 Essential (primary) hypertension: Secondary | ICD-10-CM

## 2018-08-25 ENCOUNTER — Other Ambulatory Visit: Payer: Self-pay | Admitting: Family Medicine

## 2018-08-25 DIAGNOSIS — I10 Essential (primary) hypertension: Secondary | ICD-10-CM

## 2018-09-23 ENCOUNTER — Other Ambulatory Visit: Payer: Self-pay | Admitting: Family Medicine

## 2018-09-23 DIAGNOSIS — M1711 Unilateral primary osteoarthritis, right knee: Secondary | ICD-10-CM

## 2018-10-01 ENCOUNTER — Encounter: Payer: Self-pay | Admitting: Family Medicine

## 2018-11-16 ENCOUNTER — Other Ambulatory Visit: Payer: Self-pay | Admitting: Family Medicine

## 2018-11-16 DIAGNOSIS — E559 Vitamin D deficiency, unspecified: Secondary | ICD-10-CM

## 2018-11-21 ENCOUNTER — Encounter: Payer: Self-pay | Admitting: Emergency Medicine

## 2018-11-21 ENCOUNTER — Inpatient Hospital Stay: Payer: Medicare HMO

## 2018-11-21 ENCOUNTER — Emergency Department: Payer: Medicare HMO

## 2018-11-21 ENCOUNTER — Other Ambulatory Visit: Payer: Self-pay

## 2018-11-21 ENCOUNTER — Inpatient Hospital Stay
Admission: EM | Admit: 2018-11-21 | Discharge: 2018-11-26 | DRG: 482 | Disposition: A | Discharge status: Skilled Nursing Facility | Payer: Medicare HMO | Attending: Internal Medicine | Admitting: Internal Medicine

## 2018-11-21 ENCOUNTER — Encounter
Admission: EM | Disposition: A | Discharge status: Skilled Nursing Facility | Payer: Self-pay | Source: Home / Self Care | Attending: Internal Medicine

## 2018-11-21 ENCOUNTER — Inpatient Hospital Stay: Payer: Medicare HMO | Admitting: Anesthesiology

## 2018-11-21 DIAGNOSIS — R52 Pain, unspecified: Secondary | ICD-10-CM | POA: Diagnosis not present

## 2018-11-21 DIAGNOSIS — I10 Essential (primary) hypertension: Secondary | ICD-10-CM | POA: Diagnosis present

## 2018-11-21 DIAGNOSIS — Z79899 Other long term (current) drug therapy: Secondary | ICD-10-CM | POA: Diagnosis not present

## 2018-11-21 DIAGNOSIS — Z8249 Family history of ischemic heart disease and other diseases of the circulatory system: Secondary | ICD-10-CM

## 2018-11-21 DIAGNOSIS — M545 Low back pain: Secondary | ICD-10-CM | POA: Diagnosis present

## 2018-11-21 DIAGNOSIS — S299XXA Unspecified injury of thorax, initial encounter: Secondary | ICD-10-CM | POA: Diagnosis not present

## 2018-11-21 DIAGNOSIS — F329 Major depressive disorder, single episode, unspecified: Secondary | ICD-10-CM | POA: Diagnosis not present

## 2018-11-21 DIAGNOSIS — R2681 Unsteadiness on feet: Secondary | ICD-10-CM | POA: Diagnosis not present

## 2018-11-21 DIAGNOSIS — T148XXA Other injury of unspecified body region, initial encounter: Secondary | ICD-10-CM

## 2018-11-21 DIAGNOSIS — F064 Anxiety disorder due to known physiological condition: Secondary | ICD-10-CM | POA: Diagnosis present

## 2018-11-21 DIAGNOSIS — S72002A Fracture of unspecified part of neck of left femur, initial encounter for closed fracture: Secondary | ICD-10-CM | POA: Diagnosis not present

## 2018-11-21 DIAGNOSIS — M2550 Pain in unspecified joint: Secondary | ICD-10-CM | POA: Diagnosis not present

## 2018-11-21 DIAGNOSIS — Z823 Family history of stroke: Secondary | ICD-10-CM | POA: Diagnosis not present

## 2018-11-21 DIAGNOSIS — E876 Hypokalemia: Secondary | ICD-10-CM | POA: Diagnosis not present

## 2018-11-21 DIAGNOSIS — Z1159 Encounter for screening for other viral diseases: Secondary | ICD-10-CM | POA: Diagnosis not present

## 2018-11-21 DIAGNOSIS — M549 Dorsalgia, unspecified: Secondary | ICD-10-CM | POA: Diagnosis not present

## 2018-11-21 DIAGNOSIS — W1830XA Fall on same level, unspecified, initial encounter: Secondary | ICD-10-CM | POA: Diagnosis present

## 2018-11-21 DIAGNOSIS — R0902 Hypoxemia: Secondary | ICD-10-CM | POA: Diagnosis not present

## 2018-11-21 DIAGNOSIS — E785 Hyperlipidemia, unspecified: Secondary | ICD-10-CM | POA: Diagnosis present

## 2018-11-21 DIAGNOSIS — Z7401 Bed confinement status: Secondary | ICD-10-CM | POA: Diagnosis not present

## 2018-11-21 DIAGNOSIS — Z90710 Acquired absence of both cervix and uterus: Secondary | ICD-10-CM

## 2018-11-21 DIAGNOSIS — M6281 Muscle weakness (generalized): Secondary | ICD-10-CM | POA: Diagnosis not present

## 2018-11-21 DIAGNOSIS — S0990XA Unspecified injury of head, initial encounter: Secondary | ICD-10-CM | POA: Diagnosis not present

## 2018-11-21 DIAGNOSIS — R262 Difficulty in walking, not elsewhere classified: Secondary | ICD-10-CM | POA: Diagnosis not present

## 2018-11-21 DIAGNOSIS — F418 Other specified anxiety disorders: Secondary | ICD-10-CM | POA: Diagnosis present

## 2018-11-21 DIAGNOSIS — Z880 Allergy status to penicillin: Secondary | ICD-10-CM | POA: Diagnosis not present

## 2018-11-21 DIAGNOSIS — S72142A Displaced intertrochanteric fracture of left femur, initial encounter for closed fracture: Secondary | ICD-10-CM | POA: Diagnosis not present

## 2018-11-21 DIAGNOSIS — Z7983 Long term (current) use of bisphosphonates: Secondary | ICD-10-CM | POA: Diagnosis not present

## 2018-11-21 DIAGNOSIS — W19XXXA Unspecified fall, initial encounter: Secondary | ICD-10-CM | POA: Diagnosis not present

## 2018-11-21 DIAGNOSIS — Z7982 Long term (current) use of aspirin: Secondary | ICD-10-CM | POA: Diagnosis not present

## 2018-11-21 DIAGNOSIS — M810 Age-related osteoporosis without current pathological fracture: Secondary | ICD-10-CM | POA: Diagnosis not present

## 2018-11-21 DIAGNOSIS — Z03818 Encounter for observation for suspected exposure to other biological agents ruled out: Secondary | ICD-10-CM | POA: Diagnosis not present

## 2018-11-21 DIAGNOSIS — K219 Gastro-esophageal reflux disease without esophagitis: Secondary | ICD-10-CM | POA: Diagnosis present

## 2018-11-21 DIAGNOSIS — D649 Anemia, unspecified: Secondary | ICD-10-CM | POA: Diagnosis present

## 2018-11-21 DIAGNOSIS — S72009A Fracture of unspecified part of neck of unspecified femur, initial encounter for closed fracture: Secondary | ICD-10-CM

## 2018-11-21 DIAGNOSIS — S72142D Displaced intertrochanteric fracture of left femur, subsequent encounter for closed fracture with routine healing: Secondary | ICD-10-CM | POA: Diagnosis not present

## 2018-11-21 DIAGNOSIS — I69321 Dysphasia following cerebral infarction: Secondary | ICD-10-CM

## 2018-11-21 DIAGNOSIS — Z4789 Encounter for other orthopedic aftercare: Secondary | ICD-10-CM | POA: Diagnosis not present

## 2018-11-21 HISTORY — PX: INTRAMEDULLARY (IM) NAIL INTERTROCHANTERIC: SHX5875

## 2018-11-21 LAB — BASIC METABOLIC PANEL
Anion gap: 11 (ref 5–15)
BUN: 18 mg/dL (ref 8–23)
CO2: 24 mmol/L (ref 22–32)
Calcium: 8.7 mg/dL — ABNORMAL LOW (ref 8.9–10.3)
Chloride: 106 mmol/L (ref 98–111)
Creatinine, Ser: 0.8 mg/dL (ref 0.44–1.00)
GFR calc Af Amer: >60 mL/min (ref >60)
GFR calc non Af Amer: >60 mL/min (ref >60)
Glucose, Bld: 155 mg/dL — ABNORMAL HIGH (ref 70–99)
Potassium: 3.6 mmol/L (ref 3.5–5.1)
Sodium: 141 mmol/L (ref 135–145)

## 2018-11-21 LAB — CBC
HCT: 34.3 % — ABNORMAL LOW (ref 36.0–46.0)
HCT: 35.9 % — ABNORMAL LOW (ref 36.0–46.0)
Hemoglobin: 11.3 g/dL — ABNORMAL LOW (ref 12.0–15.0)
Hemoglobin: 11.7 g/dL — ABNORMAL LOW (ref 12.0–15.0)
MCH: 28.2 pg (ref 26.0–34.0)
MCH: 28.8 pg (ref 26.0–34.0)
MCHC: 32.6 g/dL (ref 30.0–36.0)
MCHC: 32.9 g/dL (ref 30.0–36.0)
MCV: 86.5 fL (ref 80.0–100.0)
MCV: 87.5 fL (ref 80.0–100.0)
Platelets: 335 10*3/uL (ref 150–400)
Platelets: 360 10*3/uL (ref 150–400)
RBC: 3.92 MIL/uL (ref 3.87–5.11)
RBC: 4.15 MIL/uL (ref 3.87–5.11)
RDW: 14.5 % (ref 11.5–15.5)
RDW: 14.5 % (ref 11.5–15.5)
WBC: 13 10*3/uL — ABNORMAL HIGH (ref 4.0–10.5)
WBC: 18.1 10*3/uL — ABNORMAL HIGH (ref 4.0–10.5)
nRBC: 0 % (ref 0.0–0.2)
nRBC: 0 % (ref 0.0–0.2)

## 2018-11-21 LAB — COMPREHENSIVE METABOLIC PANEL
ALT: 19 U/L (ref 0–44)
AST: 22 U/L (ref 15–41)
Albumin: 3.6 g/dL (ref 3.5–5.0)
Alkaline Phosphatase: 45 U/L (ref 38–126)
Anion gap: 11 (ref 5–15)
BUN: 16 mg/dL (ref 8–23)
CO2: 23 mmol/L (ref 22–32)
Calcium: 8.9 mg/dL (ref 8.9–10.3)
Chloride: 106 mmol/L (ref 98–111)
Creatinine, Ser: 0.82 mg/dL (ref 0.44–1.00)
GFR calc Af Amer: >60 mL/min (ref >60)
GFR calc non Af Amer: >60 mL/min (ref >60)
Glucose, Bld: 174 mg/dL — ABNORMAL HIGH (ref 70–99)
Potassium: 3.5 mmol/L (ref 3.5–5.1)
Sodium: 140 mmol/L (ref 135–145)
Total Bilirubin: 0.4 mg/dL (ref 0.3–1.2)
Total Protein: 7.2 g/dL (ref 6.5–8.1)

## 2018-11-21 LAB — APTT: aPTT: 26 seconds (ref 24–36)

## 2018-11-21 LAB — HEMOGLOBIN A1C
Hgb A1c MFr Bld: 5.7 % — ABNORMAL HIGH (ref 4.8–5.6)
Mean Plasma Glucose: 116.89 mg/dL

## 2018-11-21 LAB — SAMPLE TO BLOOD BANK

## 2018-11-21 LAB — SURGICAL PCR SCREEN
MRSA, PCR: NEGATIVE
Staphylococcus aureus: NEGATIVE

## 2018-11-21 LAB — PROTIME-INR
INR: 1 (ref 0.8–1.2)
Prothrombin Time: 12.6 seconds (ref 11.4–15.2)

## 2018-11-21 LAB — SARS CORONAVIRUS 2 BY RT PCR (HOSPITAL ORDER, PERFORMED IN ~~LOC~~ HOSPITAL LAB): SARS Coronavirus 2: NEGATIVE

## 2018-11-21 LAB — TROPONIN I: Troponin I: <0.03 ng/mL (ref <0.03)

## 2018-11-21 SURGERY — FIXATION, FRACTURE, INTERTROCHANTERIC, WITH INTRAMEDULLARY ROD
Anesthesia: Spinal | Site: Hip | Laterality: Left

## 2018-11-21 MED ORDER — SODIUM CHLORIDE 0.9 % IV SOLN
INTRAVENOUS | Status: DC | PRN
  Administered 2018-11-21: 10:00:00 30 ug/min via INTRAVENOUS

## 2018-11-21 MED ORDER — VANCOMYCIN HCL IN DEXTROSE 1-5 GM/200ML-% IV SOLN
INTRAVENOUS | Status: AC
  Administered 2018-11-21: 09:00:00 1000 mg via INTRAVENOUS
  Filled 2018-11-21: qty 200

## 2018-11-21 MED ORDER — PROPOFOL 10 MG/ML IV BOLUS
INTRAVENOUS | Status: DC | PRN
  Administered 2018-11-21: 20 mg via INTRAVENOUS

## 2018-11-21 MED ORDER — METHOCARBAMOL 1000 MG/10ML IJ SOLN
500.0000 mg | Freq: Four times a day (QID) | INTRAVENOUS | Status: DC | PRN
  Filled 2018-11-21: qty 5

## 2018-11-21 MED ORDER — MAGNESIUM HYDROXIDE 400 MG/5ML PO SUSP
30.0000 mL | Freq: Every day | ORAL | Status: DC | PRN
  Filled 2018-11-21: qty 30

## 2018-11-21 MED ORDER — AMLODIPINE BESYLATE 10 MG PO TABS
10.0000 mg | ORAL_TABLET | Freq: Every day | ORAL | Status: DC
  Administered 2018-11-23 – 2018-11-26 (×4): 10 mg via ORAL
  Filled 2018-11-21 (×5): qty 1

## 2018-11-21 MED ORDER — PANTOPRAZOLE SODIUM 40 MG PO TBEC
40.0000 mg | DELAYED_RELEASE_TABLET | Freq: Every day | ORAL | Status: DC
  Administered 2018-11-22 – 2018-11-26 (×5): 40 mg via ORAL
  Filled 2018-11-21 (×5): qty 1

## 2018-11-21 MED ORDER — SODIUM CHLORIDE 0.9 % IV SOLN
INTRAVENOUS | Status: DC
  Administered 2018-11-21: 04:00:00 via INTRAVENOUS

## 2018-11-21 MED ORDER — FENTANYL CITRATE (PF) 100 MCG/2ML IJ SOLN
INTRAMUSCULAR | Status: DC | PRN
  Administered 2018-11-21 (×2): 50 ug via INTRAVENOUS

## 2018-11-21 MED ORDER — MORPHINE SULFATE (PF) 2 MG/ML IV SOLN
2.0000 mg | Freq: Once | INTRAVENOUS | Status: AC
  Administered 2018-11-21: 01:00:00 2 mg via INTRAVENOUS
  Filled 2018-11-21: qty 1

## 2018-11-21 MED ORDER — MAGNESIUM CITRATE PO SOLN: 1.0000 | Freq: Once | ORAL | Status: DC | PRN

## 2018-11-21 MED ORDER — ENOXAPARIN SODIUM 40 MG/0.4ML ~~LOC~~ SOLN
40.0000 mg | SUBCUTANEOUS | Status: DC
  Administered 2018-11-22 – 2018-11-26 (×5): 40 mg via SUBCUTANEOUS
  Filled 2018-11-21 (×5): qty 0.4

## 2018-11-21 MED ORDER — TRAZODONE HCL 50 MG PO TABS: 25.0000 mg | ORAL_TABLET | Freq: Every evening | ORAL | Status: DC | PRN

## 2018-11-21 MED ORDER — OXYCODONE HCL 5 MG PO TABS: 5.0000 mg | ORAL_TABLET | ORAL | Status: DC | PRN

## 2018-11-21 MED ORDER — PROPOFOL 500 MG/50ML IV EMUL
INTRAVENOUS | Status: AC
  Filled 2018-11-21: qty 50

## 2018-11-21 MED ORDER — HYDROCODONE-ACETAMINOPHEN 7.5-325 MG PO TABS
1.0000 | ORAL_TABLET | ORAL | Status: DC | PRN
  Administered 2018-11-24 – 2018-11-26 (×4): 1 via ORAL
  Filled 2018-11-21 (×5): qty 1

## 2018-11-21 MED ORDER — MORPHINE SULFATE (PF) 2 MG/ML IV SOLN
2.0000 mg | Freq: Once | INTRAVENOUS | Status: AC
  Administered 2018-11-21: 02:00:00 2 mg via INTRAVENOUS
  Filled 2018-11-21: qty 1

## 2018-11-21 MED ORDER — ONDANSETRON HCL 4 MG/2ML IJ SOLN: 4.0000 mg | Freq: Once | INTRAMUSCULAR | Status: DC | PRN

## 2018-11-21 MED ORDER — FENTANYL CITRATE (PF) 100 MCG/2ML IJ SOLN: 25.0000 ug | INTRAMUSCULAR | Status: DC | PRN

## 2018-11-21 MED ORDER — KETOROLAC TROMETHAMINE 15 MG/ML IJ SOLN
7.5000 mg | Freq: Four times a day (QID) | INTRAMUSCULAR | Status: AC
  Administered 2018-11-21 – 2018-11-22 (×4): 7.5 mg via INTRAVENOUS
  Filled 2018-11-21 (×4): qty 1

## 2018-11-21 MED ORDER — DOCUSATE SODIUM 100 MG PO CAPS
100.0000 mg | ORAL_CAPSULE | Freq: Two times a day (BID) | ORAL | Status: DC
  Administered 2018-11-21 – 2018-11-26 (×8): 100 mg via ORAL
  Filled 2018-11-21 (×10): qty 1

## 2018-11-21 MED ORDER — CRANBERRY-VITAMIN C 250-60 MG PO CAPS: ORAL_CAPSULE | Freq: Every day | ORAL | Status: DC

## 2018-11-21 MED ORDER — FENTANYL CITRATE (PF) 100 MCG/2ML IJ SOLN
INTRAMUSCULAR | Status: AC
  Filled 2018-11-21: qty 2

## 2018-11-21 MED ORDER — POLYETHYLENE GLYCOL 3350 17 G PO PACK
17.0000 g | PACK | Freq: Every day | ORAL | Status: DC | PRN
  Administered 2018-11-26: 09:00:00 17 g via ORAL
  Filled 2018-11-21: qty 1

## 2018-11-21 MED ORDER — METHOCARBAMOL 500 MG PO TABS
500.0000 mg | ORAL_TABLET | Freq: Four times a day (QID) | ORAL | Status: DC | PRN
  Filled 2018-11-21: qty 1

## 2018-11-21 MED ORDER — POTASSIUM CHLORIDE 20 MEQ PO PACK: 40.0000 meq | PACK | Freq: Once | ORAL | Status: DC

## 2018-11-21 MED ORDER — MORPHINE SULFATE (PF) 2 MG/ML IV SOLN
2.0000 mg | INTRAVENOUS | Status: DC | PRN
  Administered 2018-11-21: 2 mg via INTRAVENOUS
  Filled 2018-11-21: qty 1

## 2018-11-21 MED ORDER — ONDANSETRON HCL 4 MG PO TABS: 4.0000 mg | ORAL_TABLET | Freq: Four times a day (QID) | ORAL | Status: DC | PRN

## 2018-11-21 MED ORDER — SODIUM CHLORIDE 0.9 % IV SOLN
INTRAVENOUS | Status: DC
  Administered 2018-11-21: 09:00:00 via INTRAVENOUS

## 2018-11-21 MED ORDER — VANCOMYCIN HCL IN DEXTROSE 1-5 GM/200ML-% IV SOLN
1000.0000 mg | Freq: Two times a day (BID) | INTRAVENOUS | Status: AC
  Administered 2018-11-21: 22:00:00 1000 mg via INTRAVENOUS
  Filled 2018-11-21: qty 200

## 2018-11-21 MED ORDER — MORPHINE SULFATE (PF) 2 MG/ML IV SOLN: 0.5000 mg | INTRAVENOUS | Status: DC | PRN

## 2018-11-21 MED ORDER — ONDANSETRON HCL 4 MG/2ML IJ SOLN
4.0000 mg | Freq: Once | INTRAMUSCULAR | Status: AC
  Administered 2018-11-21: 01:00:00 4 mg via INTRAVENOUS
  Filled 2018-11-21: qty 2

## 2018-11-21 MED ORDER — KETAMINE HCL 50 MG/ML IJ SOLN
INTRAMUSCULAR | Status: DC | PRN
  Administered 2018-11-21 (×2): 25 mg via INTRAVENOUS

## 2018-11-21 MED ORDER — VITAMIN D 25 MCG (1000 UNIT) PO TABS
5000.0000 [IU] | ORAL_TABLET | Freq: Every day | ORAL | Status: DC
  Administered 2018-11-22 – 2018-11-26 (×5): 5000 [IU] via ORAL
  Filled 2018-11-21 (×5): qty 5

## 2018-11-21 MED ORDER — VANCOMYCIN HCL IN DEXTROSE 1-5 GM/200ML-% IV SOLN
1000.0000 mg | Freq: Once | INTRAVENOUS | Status: AC
  Administered 2018-11-21: 09:00:00 1000 mg via INTRAVENOUS
  Filled 2018-11-21: qty 200

## 2018-11-21 MED ORDER — ALENDRONATE SODIUM 70 MG PO TABS: 70.0000 mg | ORAL_TABLET | ORAL | Status: DC

## 2018-11-21 MED ORDER — ACETAMINOPHEN 325 MG PO TABS
325.0000 mg | ORAL_TABLET | Freq: Four times a day (QID) | ORAL | Status: DC | PRN
  Administered 2018-11-22 – 2018-11-25 (×4): 650 mg via ORAL
  Filled 2018-11-21 (×4): qty 2

## 2018-11-21 MED ORDER — PROPOFOL 500 MG/50ML IV EMUL
INTRAVENOUS | Status: DC | PRN
  Administered 2018-11-21: 25 ug/kg/min via INTRAVENOUS

## 2018-11-21 MED ORDER — MIDAZOLAM HCL 2 MG/2ML IJ SOLN
INTRAMUSCULAR | Status: AC
  Filled 2018-11-21: qty 2

## 2018-11-21 MED ORDER — BUPIVACAINE HCL (PF) 0.5 % IJ SOLN
INTRAMUSCULAR | Status: AC
  Filled 2018-11-21: qty 10

## 2018-11-21 MED ORDER — POTASSIUM CHLORIDE CRYS ER 10 MEQ PO TBCR
10.0000 meq | EXTENDED_RELEASE_TABLET | Freq: Every day | ORAL | Status: DC
  Administered 2018-11-22 – 2018-11-23 (×2): 10 meq via ORAL
  Filled 2018-11-21 (×2): qty 1

## 2018-11-21 MED ORDER — KETAMINE HCL 50 MG/ML IJ SOLN
INTRAMUSCULAR | Status: AC
  Filled 2018-11-21: qty 10

## 2018-11-21 MED ORDER — BUPIVACAINE HCL (PF) 0.5 % IJ SOLN
INTRAMUSCULAR | Status: DC | PRN
  Administered 2018-11-21: 3 mL via INTRATHECAL

## 2018-11-21 MED ORDER — LISINOPRIL 20 MG PO TABS
40.0000 mg | ORAL_TABLET | Freq: Every day | ORAL | Status: DC
  Administered 2018-11-24 – 2018-11-26 (×3): 40 mg via ORAL
  Filled 2018-11-21 (×5): qty 2

## 2018-11-21 MED ORDER — ONDANSETRON HCL 4 MG/2ML IJ SOLN: 4.0000 mg | Freq: Four times a day (QID) | INTRAMUSCULAR | Status: DC | PRN

## 2018-11-21 MED ORDER — FLUOXETINE HCL 20 MG PO CAPS
40.0000 mg | ORAL_CAPSULE | Freq: Every day | ORAL | Status: DC
  Administered 2018-11-22 – 2018-11-26 (×5): 40 mg via ORAL
  Filled 2018-11-21 (×6): qty 2

## 2018-11-21 MED ORDER — EPHEDRINE SULFATE 50 MG/ML IJ SOLN
INTRAMUSCULAR | Status: DC | PRN
  Administered 2018-11-21 (×3): 10 mg via INTRAVENOUS

## 2018-11-21 MED ORDER — GENTAMICIN SULFATE 40 MG/ML IJ SOLN
INTRAMUSCULAR | Status: AC
  Filled 2018-11-21: qty 2

## 2018-11-21 MED ORDER — SODIUM CHLORIDE 0.9 % IR SOLN
Status: DC | PRN
  Administered 2018-11-21: 10:00:00 200 mL

## 2018-11-21 MED ORDER — ACETAMINOPHEN 500 MG PO TABS
500.0000 mg | ORAL_TABLET | Freq: Four times a day (QID) | ORAL | Status: AC
  Administered 2018-11-21 – 2018-11-22 (×4): 500 mg via ORAL
  Filled 2018-11-21 (×4): qty 1

## 2018-11-21 MED ORDER — EPHEDRINE SULFATE 50 MG/ML IJ SOLN
INTRAMUSCULAR | Status: AC
  Filled 2018-11-21: qty 1

## 2018-11-21 MED ORDER — SODIUM CHLORIDE 0.9 % IV SOLN
75.0000 mL/h | INTRAVENOUS | Status: DC
  Administered 2018-11-21 (×2): 75 mL/h via INTRAVENOUS

## 2018-11-21 MED ORDER — ACETAMINOPHEN 650 MG RE SUPP: 650.0000 mg | Freq: Four times a day (QID) | RECTAL | Status: DC | PRN

## 2018-11-21 MED ORDER — ACETAMINOPHEN 325 MG PO TABS: 650.0000 mg | ORAL_TABLET | Freq: Four times a day (QID) | ORAL | Status: DC | PRN

## 2018-11-21 MED ORDER — PRAVASTATIN SODIUM 20 MG PO TABS
20.0000 mg | ORAL_TABLET | Freq: Every day | ORAL | Status: DC
  Administered 2018-11-21 – 2018-11-25 (×5): 20 mg via ORAL
  Filled 2018-11-21 (×5): qty 1

## 2018-11-21 MED ORDER — BISACODYL 10 MG RE SUPP
10.0000 mg | Freq: Every day | RECTAL | Status: DC | PRN
  Administered 2018-11-24: 10 mg via RECTAL
  Filled 2018-11-21: qty 1

## 2018-11-21 MED ORDER — ASPIRIN EC 81 MG PO TBEC
81.0000 mg | DELAYED_RELEASE_TABLET | Freq: Every day | ORAL | Status: DC
  Administered 2018-11-22 – 2018-11-26 (×5): 81 mg via ORAL
  Filled 2018-11-21 (×5): qty 1

## 2018-11-21 MED ORDER — IPRATROPIUM BROMIDE 0.06 % NA SOLN: 2.0000 | Freq: Four times a day (QID) | NASAL | Status: DC

## 2018-11-21 MED ORDER — HYDROCODONE-ACETAMINOPHEN 5-325 MG PO TABS
1.0000 | ORAL_TABLET | ORAL | Status: DC | PRN
  Administered 2018-11-21 – 2018-11-26 (×7): 1 via ORAL
  Filled 2018-11-21 (×7): qty 1

## 2018-11-21 SURGICAL SUPPLY — 41 items
BIT DRILL CANN LG 4.3MM (BIT) ×1 IMPLANT
BNDG COHESIVE 6X5 TAN STRL LF (GAUZE/BANDAGES/DRESSINGS) ×6 IMPLANT
CANISTER SUCT 1200ML W/VALVE (MISCELLANEOUS) ×3 IMPLANT
COVER WAND RF STERILE (DRAPES) ×3 IMPLANT
CRADLE LAMINECT ARM (MISCELLANEOUS) ×6 IMPLANT
DRAPE SHEET LG 3/4 BI-LAMINATE (DRAPES) ×6 IMPLANT
DRAPE SURG 17X11 SM STRL (DRAPES) ×6 IMPLANT
DRAPE U-SHAPE 47X51 STRL (DRAPES) ×3 IMPLANT
DRILL BIT CANN LG 4.3MM (BIT) ×3
DRSG OPSITE POSTOP 3X4 (GAUZE/BANDAGES/DRESSINGS) ×6 IMPLANT
DRSG OPSITE POSTOP 4X14 (GAUZE/BANDAGES/DRESSINGS) IMPLANT
DRSG OPSITE POSTOP 4X6 (GAUZE/BANDAGES/DRESSINGS) ×3 IMPLANT
DURAPREP 26ML APPLICATOR (WOUND CARE) ×6 IMPLANT
ELECT REM PT RETURN 9FT ADLT (ELECTROSURGICAL) ×3
ELECTRODE REM PT RTRN 9FT ADLT (ELECTROSURGICAL) ×1 IMPLANT
GLOVE BIOGEL PI IND STRL 9 (GLOVE) ×1 IMPLANT
GLOVE BIOGEL PI INDICATOR 9 (GLOVE) ×2
GLOVE SURG 9.0 ORTHO LTXF (GLOVE) ×6 IMPLANT
GOWN STRL REUS TWL 2XL XL LVL4 (GOWN DISPOSABLE) ×3 IMPLANT
GOWN STRL REUS W/ TWL LRG LVL3 (GOWN DISPOSABLE) ×1 IMPLANT
GOWN STRL REUS W/TWL LRG LVL3 (GOWN DISPOSABLE) ×2
GUIDEPIN VERSANAIL DSP 3.2X444 (ORTHOPEDIC DISPOSABLE SUPPLIES) ×3 IMPLANT
HEMOVAC 400CC 10FR (MISCELLANEOUS) IMPLANT
KIT TURNOVER CYSTO (KITS) ×3 IMPLANT
MAT ABSORB  FLUID 56X50 GRAY (MISCELLANEOUS) ×2
MAT ABSORB FLUID 56X50 GRAY (MISCELLANEOUS) ×1 IMPLANT
NAIL HIP FRACT 130D 11X180 (Screw) ×3 IMPLANT
NS IRRIG 1000ML POUR BTL (IV SOLUTION) ×3 IMPLANT
PACK HIP COMPR (MISCELLANEOUS) ×3 IMPLANT
PENCIL ELECTRO HAND CTR (MISCELLANEOUS) ×3 IMPLANT
SCREW BONE CORTICAL 5.0X36 (Screw) ×3 IMPLANT
SCREW LAG HIP NAIL 10.5X95 (Screw) ×3 IMPLANT
SCREWDRIVER HEX TIP 3.5MM (MISCELLANEOUS) ×3 IMPLANT
STAPLER SKIN PROX 35W (STAPLE) ×3 IMPLANT
SUCTION FRAZIER HANDLE 10FR (MISCELLANEOUS)
SUCTION TUBE FRAZIER 10FR DISP (MISCELLANEOUS) IMPLANT
SUT VIC AB 0 CT1 36 (SUTURE) ×6 IMPLANT
SUT VIC AB 2-0 CT1 27 (SUTURE) ×2
SUT VIC AB 2-0 CT1 TAPERPNT 27 (SUTURE) ×1 IMPLANT
SUT VICRYL 0 AB UR-6 (SUTURE) ×3 IMPLANT
SYR 30ML LL (SYRINGE) ×3 IMPLANT

## 2018-11-21 NOTE — ED Notes (Signed)
Patient daughter [name] [facility]) updated, notified of room assignment, given patient's phone number

## 2018-11-21 NOTE — Anesthesia Post-op Follow-up Note (Signed)
Anesthesia QCDR form completed.        

## 2018-11-21 NOTE — TOC Benefit Eligibility Note (Signed)
Transition of Care [facility]) Benefit Eligibility Note    Patient Details  Name: Jill Hancock MRN: [id number] Date of Birth: [birth year]   Medication/Dose: Enoxaparin 40 mg. daily x14 days  OR  Lovenox 40 mg., daily x14 days   Covered?: Yes  Tier: (Tier 4)  Prescription Coverage Preferred Pharmacy: Retail pharmacy price for Generic is $238.00 - [facility], [facility], [facility], [facility] - [address]  Spoke with Person/Company/Phone Number:: [place], [employer], [phone]  Co-Pay: Enoxaparin 40 mg, daily x14 days  $214.88 OR  Lovenox 40 mg., daily, x14 days $982.86 - [facility] [facility]  Prior Approval: No  Deductible: (Doesn't apply to drug benefit-use cost listed)       Verita Schneiders [place]ain Fettes Phone Number: 11/21/2018, 12:15 PM

## 2018-11-21 NOTE — Progress Notes (Signed)
Swelling to left thigh dr Martha Clan in to check pt

## 2018-11-21 NOTE — ED Notes (Signed)
Called and updated daughter [name]

## 2018-11-21 NOTE — ED Notes (Signed)
ED TO INPATIENT HANDOFF REPORT  ED Nurse Name and Phone #: Jae Dire [phone]  S Name/Age/Gender Jill Hancock 78 y.o. female Room/Bed: ED10A/ED10A  Code Status   Code Status: Full Code  Home/SNF/Other Home Patient oriented to: self, place, time and situation Is this baseline? Yes   Triage Complete: Triage complete  Chief Complaint Fall   Triage Note Patient arrives via EMS post fall. Patient lives alone and reports losing her balance earlier this evening, falling and injuring her L hip. EMS reports patient was down about an hour. L hip is externally rotated and shortened. Patient has some dysarthria, had stroke approx 2 years ago   Allergies Allergies  Allergen Reactions  . Demerol [Meperidine] Nausea And Vomiting  . Macrobid [Nitrofurantoin Macrocrystal] Rash  . Penicillins Rash    Has patient had a PCN reaction causing immediate rash, facial/tongue/throat swelling, SOB or lightheadedness with hypotension: Unknown Has patient had a PCN reaction causing severe rash involving mucus membranes or skin necrosis: Unknown Has patient had a PCN reaction that required hospitalization: Unknown Has patient had a PCN reaction occurring within the last 10 years: No If all of the above answers are "NO", then may proceed with Cephalosporin use.     Level of Care/Admitting Diagnosis ED Disposition    ED Disposition Condition Comment   Admit  Hospital Area: [facility] [[id number]]  Level of Care: Med-Surg [16]  Covid Evaluation: N/A  Diagnosis: Closed left hip fracture [facility]) [[id number]]  Admitting Physician: Hannah Beat [[id number]]  Attending Physician: Hannah Beat [[id number]]  Estimated length of stay: past midnight tomorrow  Certification:: I certify this patient will need inpatient services for at least 2 midnights  PT Class (Do Not Modify): Inpatient [101]  PT Acc Code (Do Not Modify): Private [1]       B Medical/Surgery History Past Medical History:   Diagnosis Date  . Anxiety   . Depression   . Hyperglycemia   . Hypertension   . Hypokalemia   . Osteoporosis   . Stroke [facility]) 02/2012   MRI revealed at least 3 subcentimeter acute infarctions with one area of subacute infarction in widely disparate vascular territories including territory of left cerebellum and around right caudate nucleus along with widespread lacunar infarcts, chronic microvascular ischemic change, and numerous microbleeds suggesting long standing hypertensive cerebrovascular disease.  MRA confirmed intracranial  . Unsteady gait   . Vertigo    Past Surgical History:  Procedure Laterality Date  . BREAST BIOPSY Right    pt not sure when   . CHOLECYSTECTOMY    . VAGINAL HYSTERECTOMY       A IV Location/Drains/Wounds Patient Lines/Drains/Airways Status   Active Line/Drains/Airways    Name:   Placement date:   Placement time:   Site:   Days:   Peripheral IV 11/21/18 Left Antecubital   11/21/18    -    Antecubital   less than 1          Intake/Output Last 24 hours No intake or output data in the 24 hours ending 11/21/18 0206  Labs/Imaging Results for orders placed or performed during the hospital encounter of 11/21/18 (from the past 48 hour(s))  CBC     Status: Abnormal   Collection Time: 11/21/18 12:30 AM  Result Value Ref Range   WBC 13.0 (H) 4.0 - 10.5 K/uL   RBC 4.15 3.87 - 5.11 MIL/uL   Hemoglobin 11.7 (L) 12.0 - 15.0 g/dL   HCT 82.5 (L) 00.3 -  46.0 %   MCV 86.5 80.0 - 100.0 fL   MCH 28.2 26.0 - 34.0 pg   MCHC 32.6 30.0 - 36.0 g/dL   RDW 16.114.5 09.611.5 - 04.515.5 %   Platelets 360 150 - 400 K/uL   nRBC 0.0 0.0 - 0.2 %    Comment: Performed at [facility], [address] [place], [place] [address]  Comprehensive metabolic panel     Status: Abnormal   Collection Time: 11/21/18 12:30 AM  Result Value Ref Range   Sodium 140 135 - 145 mmol/L   Potassium 3.5 3.5 - 5.1 mmol/L   Chloride 106 98 - 111 mmol/L   CO2 23 22 - 32 mmol/L   Glucose, Bld  174 (H) 70 - 99 mg/dL   BUN 16 8 - 23 mg/dL   Creatinine, Ser 1.910.82 0.44 - 1.00 mg/dL   Calcium 8.9 8.9 - 47.810.3 mg/dL   Total Protein 7.2 6.5 - 8.1 g/dL   Albumin 3.6 3.5 - 5.0 g/dL   AST 22 15 - 41 U/L   ALT 19 0 - 44 U/L   Alkaline Phosphatase 45 38 - 126 U/L   Total Bilirubin 0.4 0.3 - 1.2 mg/dL   GFR calc non Af Amer >60 >60 mL/min   GFR calc Af Amer >60 >60 mL/min   Anion gap 11 5 - 15    Comment: Performed at [facility], [address] [place], [place] [address]  Troponin I - ONCE - STAT     Status: None   Collection Time: 11/21/18 12:30 AM  Result Value Ref Range   Troponin I <0.03 <0.03 ng/mL    Comment: Performed at [facility], [address] [place], [place] [address]   Dg Chest 1 View  Result Date: 11/21/2018 CLINICAL DATA:  [age] year old female status post fall with left hip fracture. EXAM: CHEST  1 VIEW COMPARISON:  Chest CT 12/31/2017 and earlier. FINDINGS: Supine AP view at 0058 hours. Stable lung volumes and mediastinal contours. Chronic pulmonary interstitial opacity which might correspond to a degree of developing fibrosis. No pneumothorax, pleural effusion or acute pulmonary opacity. Visualized tracheal air column is within normal limits. Stable visualized osseous structures. IMPRESSION: No acute cardiopulmonary abnormality. 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 Electronically Signed   By: Odessa FlemingH  Hall M.D.   On: 11/21/2018 01:27   Dg Hip Unilat W Or Wo Pelvis 2-3 Views Left  Result Date: 11/21/2018 CLINICAL DATA:  78 year old female status post fall with left hip pain. EXAM: DG HIP (WITH OR WITHOUT PELVIS) 2-3V LEFT COMPARISON:  05/29/2016. FINDINGS: Comminuted left femur intertrochanteric fracture with varus impaction. Left femoral head remains normally located. Underlying osteopenia. No acute fracture of the pelvis identified. Proximal right femur appears stable and intact. Negative visible bowel gas pattern. IMPRESSION: Comminuted left  femur intertrochanteric fracture with varus impaction. Electronically Signed   By: Odessa FlemingH  Hall M.D.   On: 11/21/2018 01:26    Pending Labs Unresulted Labs (From admission, onward)    Start     Ordered   11/21/18 0500  Basic metabolic panel  Tomorrow morning,   STAT     11/21/18 0159   11/21/18 0500  CBC  Tomorrow morning,   STAT     11/21/18 0159          Vitals/Pain Today's Vitals   11/21/18 0030 11/21/18 0130 11/21/18 0200 11/21/18 0202  BP: 128/71 126/69 129/69   Pulse: 81 80 86   Resp: (!) 24 (!) 23 11  Temp:      TempSrc:      SpO2: 96% 93% 95%   Weight:      Height:      PainSc:    6     Isolation Precautions No active isolations  Medications Medications  amLODipine (NORVASC) tablet 10 mg (has no administration in time range)  lisinopril (ZESTRIL) tablet 40 mg (has no administration in time range)  pravastatin (PRAVACHOL) tablet 20 mg (has no administration in time range)  FLUoxetine (PROZAC) capsule 40 mg (has no administration in time range)  pantoprazole (PROTONIX) EC tablet 40 mg (has no administration in time range)  cholecalciferol (VITAMIN D3) tablet 5,000 Units (has no administration in time range)  potassium chloride SA (K-DUR) CR tablet 10 mEq (has no administration in time range)  ipratropium (ATROVENT) 0.06 % nasal spray 2 spray (has no administration in time range)  0.9 %  sodium chloride infusion (has no administration in time range)  acetaminophen (TYLENOL) tablet 650 mg (has no administration in time range)    Or  acetaminophen (TYLENOL) suppository 650 mg (has no administration in time range)  oxyCODONE (Oxy IR/ROXICODONE) immediate release tablet 5 mg (has no administration in time range)  traZODone (DESYREL) tablet 25 mg (has no administration in time range)  magnesium hydroxide (MILK OF MAGNESIA) suspension 30 mL (has no administration in time range)  ondansetron (ZOFRAN) tablet 4 mg (has no administration in time range)    Or  ondansetron  (ZOFRAN) injection 4 mg (has no administration in time range)  morphine 2 MG/ML injection 2 mg (has no administration in time range)  morphine 2 MG/ML injection 2 mg (2 mg Intravenous Given 11/21/18 0038)  ondansetron (ZOFRAN) injection 4 mg (4 mg Intravenous Given 11/21/18 0038)  morphine 2 MG/ML injection 2 mg (2 mg Intravenous Given 11/21/18 0203)    Mobility non-ambulatory High fall risk   Focused Assessments    R Recommendations: See Admitting Provider Note  Report given to:   Additional Notes:

## 2018-11-21 NOTE — H&P (Signed)
[employer] [facility] [place] [facility]   PATIENT NAME: Jill Hancock    MR#:  [id number]  DATE OF BIRTH:  [birth year]  DATE OF ADMISSION:  11/21/2018  PRIMARY CARE PHYSICIAN: Smitty Cords, DO   REQUESTING/REFERRING PHYSICIAN: Bayard Males, MD CHIEF COMPLAINT:   Chief Complaint  Patient presents with  . Fall  . Leg Pain    HISTORY OF PRESENT ILLNESS:  Jill Hancock  is a 78 y.o. Caucasian female with a known history of multiple medical problems that will be mentioned below, who presented to the emergency room with acute onset of left hip pain status post mechanical fall.  Patient was going to the kitchen and lost balance falling on her left side.  She denied any presyncope or syncope.  No headache or dizziness or blurred vision or any head injury.  She denied any chest pain or palpitations before or after her fall.  No nausea vomiting or abdominal pain.  No dysuria, oliguria or hematuria or flank pain.  No other bleeding diathesis.  Upon presentation to the emergency room, respiratory rate was 24 and otherwise vital signs were within normal.  Labs revealed borderline potassium of 3.5 and a blood glucose of 174 and troponin I less than 0.03 with mild leukocytosis of 13.  COVID 19 test is currently pending.  Portable chest ray showed no acute cardiopulmonary disease and x-ray revealed comminuted left femoral intertrochanteric fracture with varus impaction.  EKG showed normal sinus rhythm with rate of 83 with left axis deviation and low voltage QRS with poor R wave progression.  The patient was given 2 mg IV morphine sulfate twice and 4 mg of IV Zofran.  She will be admitted to a surgical bed for further evaluation and management. PAST MEDICAL HISTORY:   Past Medical History:  Diagnosis Date  . Anxiety   . Depression   . Hyperglycemia   . Hypertension   . Hypokalemia   . Osteoporosis   . Stroke [facility]) 02/2012   MRI revealed at least 3 subcentimeter  acute infarctions with one area of subacute infarction in widely disparate vascular territories including territory of left cerebellum and around right caudate nucleus along with widespread lacunar infarcts, chronic microvascular ischemic change, and numerous microbleeds suggesting long standing hypertensive cerebrovascular disease.  MRA confirmed intracranial  . Unsteady gait   . Vertigo     PAST SURGICAL HISTORY:   Past Surgical History:  Procedure Laterality Date  . BREAST BIOPSY Right    pt not sure when   . CHOLECYSTECTOMY    . VAGINAL HYSTERECTOMY      SOCIAL HISTORY:   Social History   Tobacco Use  . Smoking status: Never Smoker  . Smokeless tobacco: Never Used  Substance Use Topics  . Alcohol use: No    Alcohol/week: 0.0 standard drinks    FAMILY HISTORY:   Family History  Problem Relation Age of Onset  . Heart attack Mother   . Heart disease Mother   . Heart disease Father   . Heart attack Brother        death at age 79  . Stroke Brother        death at 64  . Bladder Cancer Neg Hx   . Kidney cancer Neg Hx     DRUG ALLERGIES:   Allergies  Allergen Reactions  . Demerol [Meperidine] Nausea And Vomiting  . Macrobid [Nitrofurantoin Macrocrystal] Rash  . Penicillins Rash    Has patient had a PCN reaction  causing immediate rash, facial/tongue/throat swelling, SOB or lightheadedness with hypotension: Unknown Has patient had a PCN reaction causing severe rash involving mucus membranes or skin necrosis: Unknown Has patient had a PCN reaction that required hospitalization: Unknown Has patient had a PCN reaction occurring within the last 10 years: No If all of the above answers are "NO", then may proceed with Cephalosporin use.     REVIEW OF SYSTEMS:   ROS As per history of present illness. All pertinent systems were reviewed above. Constitutional,  HEENT, cardiovascular, respiratory, GI, GU, musculoskeletal, neuro, psychiatric, endocrine,  integumentary and  hematologic systems were reviewed and are otherwise  negative/unremarkable except for positive findings mentioned above in the HPI.   MEDICATIONS AT HOME:   Prior to Admission medications   Medication Sig Start Date End Date Taking? Authorizing Provider  acetaminophen (TYLENOL) 500 MG tablet Take 500 mg by mouth every 6 (six) hours as needed for mild pain or headache.     [provider]  alendronate (FOSAMAX) 70 MG tablet TAKE 1 TABLET EVERY 7 DAYS.  SEE PACKAGE FOR ADDITIONAL INSTRUCTIONS 02/22/18   Smitty Cords, DO  amLODipine (NORVASC) 10 MG tablet TAKE 1 TABLET BY MOUTH EVERY DAY 08/26/18   Althea Charon, Netta Neat, DO  aspirin 81 MG tablet Take 1 tablet (81 mg total) by mouth daily. 03/07/13   Ronal Fear, NP  Cholecalciferol (VITAMIN D3) 125 MCG (5000 UT) CAPS Take 1 capsule (5,000 Units total) by mouth daily. For 12 weeks, then start Vitamin D3 2,000 units daily (OTC) 07/29/18   Karamalegos, Netta Neat, DO  Cranberry-Vitamin C (AZO CRANBERRY URINARY TRACT PO) Take 2 tablets by mouth daily. 03/20/18   [provider]  dimenhyDRINATE (DRAMAMINE) 50 MG tablet Take 50 mg by mouth every 8 (eight) hours as needed.    [provider]  esomeprazole (NEXIUM) 20 MG capsule Take 1 capsule (20 mg total) by mouth daily before breakfast. 12/14/17   Althea Charon, Netta Neat, DO  FLUoxetine (PROZAC) 40 MG capsule Take 1 capsule (40 mg total) by mouth daily. 05/28/18   Karamalegos, Netta Neat, DO  ipratropium (ATROVENT) 0.06 % nasal spray Place 2 sprays into both nostrils 4 (four) times daily. For up to 5-7 days then stop. 04/10/18   Karamalegos, Netta Neat, DO  lisinopril (PRINIVIL,ZESTRIL) 40 MG tablet Take 1 tablet (40 mg total) by mouth daily. 02/22/18   Karamalegos, Netta Neat, DO  meloxicam (MOBIC) 15 MG tablet TAKE 1 TABLET BY MOUTH EVERY DAY 09/23/18   Karamalegos, Netta Neat, DO  potassium chloride (K-DUR,KLOR-CON) 10 MEQ tablet Take 1 tablet (10 mEq total) by  mouth daily. 05/28/18   Karamalegos, Netta Neat, DO  pravastatin (PRAVACHOL) 20 MG tablet Take 1 tablet (20 mg total) by mouth at bedtime. 02/22/18   Karamalegos, Netta Neat, DO      VITAL SIGNS:  Blood pressure 130/63, pulse 83, temperature 98.3 F (36.8 C), temperature source Oral, resp. rate 18, height  (1.6 m), weight 68 kg, SpO2 95 %.  PHYSICAL EXAMINATION:  Physical Exam  GENERAL:  78 y.o.-year-old Caucasian female patient lying in the bed with no acute distress.  EYES: Pupils equal, round, reactive to light and accommodation. No scleral icterus. Extraocular muscles intact.  HEENT: Head atraumatic, normocephalic. Oropharynx and nasopharynx clear.  NECK:  Supple, no jugular venous distention. No thyroid enlargement, no tenderness.  LUNGS: Normal breath sounds bilaterally, no wheezing, rales,rhonchi or crepitation. No use of accessory muscles of respiration.  CARDIOVASCULAR: Regular rate and rhythm,  S1, S2 normal. No murmurs, rubs, or gallops.  ABDOMEN: Soft, nondistended, nontender. Bowel sounds present. No organomegaly or mass.  EXTREMITIES: No pedal edema, cyanosis, or clubbing.  NEUROLOGIC: Cranial nerves II through XII are intact. Muscle strength 5/5 in all extremities. Sensation intact. Gait not checked. Musculoskeletal: She had left hip tenderness. PSYCHIATRIC: The patient is alert and oriented x 3.  Normal affect and good eye contact. SKIN: No obvious rash, lesion, or ulcer.   LABORATORY PANEL:   CBC Recent Labs  Lab 11/21/18 0030  WBC 13.0*  HGB 11.7*  HCT 35.9*  PLT 360   ------------------------------------------------------------------------------------------------------------------  Chemistries  Recent Labs  Lab 11/21/18 0030  NA 140  K 3.5  CL 106  CO2 23  GLUCOSE 174*  BUN 16  CREATININE 0.82  CALCIUM 8.9  AST 22  ALT 19  ALKPHOS 45  BILITOT 0.4    ------------------------------------------------------------------------------------------------------------------  Cardiac Enzymes Recent Labs  Lab 11/21/18 0030  TROPONINI <0.03   ------------------------------------------------------------------------------------------------------------------  RADIOLOGY:  Dg Chest 1 View  Result Date: 11/21/2018 CLINICAL DATA:  78 year old female status post fall with left hip fracture. EXAM: CHEST  1 VIEW COMPARISON:  Chest CT 12/31/2017 and earlier. FINDINGS: Supine AP view at 0058 hours. Stable lung volumes and mediastinal contours. Chronic pulmonary interstitial opacity which might correspond to a degree of developing fibrosis. No pneumothorax, pleural effusion or acute pulmonary opacity. Visualized tracheal air column is within normal limits. Stable visualized osseous structures. IMPRESSION: No acute cardiopulmonary abnormality. 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 Electronically Signed   By: Odessa Fleming M.D.   On: 11/21/2018 01:27   Dg Hip Unilat W Or Wo Pelvis 2-3 Views Left  Result Date: 11/21/2018 CLINICAL DATA:  78 year old female status post fall with left hip pain. EXAM: DG HIP (WITH OR WITHOUT PELVIS) 2-3V LEFT COMPARISON:  05/29/2016. FINDINGS: Comminuted left femur intertrochanteric fracture with varus impaction. Left femoral head remains normally located. Underlying osteopenia. No acute fracture of the pelvis identified. Proximal right femur appears stable and intact. Negative visible bowel gas pattern. IMPRESSION: Comminuted left femur intertrochanteric fracture with varus impaction. Electronically Signed   By: Odessa Fleming M.D.   On: 11/21/2018 01:26      IMPRESSION AND PLAN:   1.  Left hip fracture status post mechanical fall.  The patient will be admitted to a surgical bed.  Pain management will be provided.  Orthopedic consultation will be obtained by Dr. Martha Clan who was notified about the patient.  The patient will be kept  n.p.o.  We will hold her aspirin.  We will continue beta-blocker therapy.  She has leukocytosis that is likely related to stress demargination.  Will obtain a urinalysis.  The patient has a history of CVA but no history of CHF, coronary artery disease, diabetes mellitus on insulin or renal failure.  She is considered above average risk for perioperative cardiovascular events per the revised cardiac risk index.  She has no current pulmonary issues.  2.  Hypertension.  Her antihypertensives will be continued.  3.  GERD.  PPI therapy will be resumed.  4.  Dyslipidemia.  Statin therapy will be resumed.  5.  Depression.  Continue her antidepressants.  6.  DVT prophylaxis.  This will be provided with SCDs.  Medical prophylaxis currently held off preoperatively.  All the records are reviewed and case discussed with ED provider. The plan of care was discussed in details with the patient (and family). I answered all questions. The patient agreed to proceed with the above  mentioned plan. Further management will depend upon hospital course.   CODE STATUS: Full code  TOTAL TIME TAKING CARE OF THIS PATIENT: 55 minutes.    Hannah BeatJan A Chondra Boyde M.D on 11/21/2018 at 4:09 AM  Pager - [phone]  After 6pm go to [email or web address] - Social research [email or web address], [email or web address] [email or web address]  [facility]  Office  [phone]  CC: Primary care physician; Smitty CordsKaramalegos, Alexander J, DO   Note: This dictation was prepared with Dragon dictation along with smaller phrase technology. Any transcriptional errors that result from this process are unintentional.

## 2018-11-21 NOTE — ED Triage Notes (Signed)
Patient arrives via EMS post fall. Patient lives alone and reports losing her balance earlier this evening, falling and injuring her L hip. EMS reports patient was down about an hour. L hip is externally rotated and shortened. Patient has some dysarthria, had stroke approx 2 years ago

## 2018-11-21 NOTE — ED Provider Notes (Signed)
[facility] Emergency Department Provider Note    None    (approximate)  I have reviewed the triage vital signs and the nursing notes.   HISTORY  Chief Complaint Fall and Leg Pain    HPI Jill Hancock is a 78 y.o. female with below list of previous medical conditions presents to the emergency department following accidental fall at home with resultant 9 out of 10 left hip pain worse with any movement.  Patient states that she "lost her balance".  Patient denies any head injury.  Patient denies any loss of consciousness.        Past Medical History:  Diagnosis Date  . Anxiety   . Depression   . Hyperglycemia   . Hypertension   . Hypokalemia   . Osteoporosis   . Stroke [facility](HCC) 02/2012   MRI revealed at least 3 subcentimeter acute infarctions with one area of subacute infarction in widely disparate vascular territories including territory of left cerebellum and around right caudate nucleus along with widespread lacunar infarcts, chronic microvascular ischemic change, and numerous microbleeds suggesting long standing hypertensive cerebrovascular disease.  MRA confirmed intracranial  . Unsteady gait   . Vertigo     Patient Active Problem List   Diagnosis Date Noted  . Closed left hip fracture (HCC) 11/21/2018  . Pre-diabetes 07/25/2018  . Multiple rib fractures 12/31/2017  . Primary osteoarthritis of right knee 12/14/2017  . Dementia, vascular (HCC) 12/04/2017  . Right shoulder injury 11/12/2015  . Urinary urgency 09/17/2015  . Hyperglycemia 07/29/2015  . Restless leg 06/07/2015  . Benign paroxysmal positional vertigo 06/07/2015  . Hyperlipidemia 04/16/2015  . Vitamin D deficiency 08/13/2014  . T12 compression fracture (HCC) 04/28/2014  . 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 Major depression, recurrent, chronic (HCC) 11/24/2012  . GERD (gastroesophageal reflux disease) 11/24/2012  . Osteoporosis 09/05/2012  . Right hip pain 08/23/2012  .  Anxiety 05/04/2012  . Hypertension 02/27/2012  . History of cerebrovascular accident (CVA) with residual deficit 02/25/2012  . Unsteady gait 02/25/2012    Past Surgical History:  Procedure Laterality Date  . BREAST BIOPSY Right    pt not sure when   . CHOLECYSTECTOMY    . VAGINAL HYSTERECTOMY      Prior to Admission medications   Medication Sig Start Date End Date Taking? Authorizing Provider  acetaminophen (TYLENOL) 500 MG tablet Take 500 mg by mouth every 6 (six) hours as needed for mild pain or headache.     [provider]  alendronate (FOSAMAX) 70 MG tablet TAKE 1 TABLET EVERY 7 DAYS.  SEE PACKAGE FOR ADDITIONAL INSTRUCTIONS 02/22/18   Smitty CordsKaramalegos, Alexander J, DO  amLODipine (NORVASC) 10 MG tablet TAKE 1 TABLET BY MOUTH EVERY DAY 08/26/18   Althea CharonKaramalegos, Netta NeatAlexander J, DO  aspirin 81 MG tablet Take 1 tablet (81 mg total) by mouth daily. 03/07/13   Ronal FearLam, Lynn E, NP  Cholecalciferol (VITAMIN D3) 125 MCG (5000 UT) CAPS Take 1 capsule (5,000 Units total) by mouth daily. For 12 weeks, then start Vitamin D3 2,000 units daily (OTC) 07/29/18   Karamalegos, Netta NeatAlexander J, DO  Cranberry-Vitamin C (AZO CRANBERRY URINARY TRACT PO) Take 2 tablets by mouth daily. 03/20/18   [provider]  dimenhyDRINATE (DRAMAMINE) 50 MG tablet Take 50 mg by mouth every 8 (eight) hours as needed.    [provider]  esomeprazole (NEXIUM) 20 MG capsule Take 1 capsule (20 mg total) by mouth daily before breakfast. 12/14/17   Smitty CordsKaramalegos, Alexander J, DO  FLUoxetine (PROZAC) 40 MG capsule Take 1 capsule (40 mg total) by mouth daily. 05/28/18   Karamalegos, Netta Neat, DO  ipratropium (ATROVENT) 0.06 % nasal spray Place 2 sprays into both nostrils 4 (four) times daily. For up to 5-7 days then stop. 04/10/18   Karamalegos, Netta Neat, DO  lisinopril (PRINIVIL,ZESTRIL) 40 MG tablet Take 1 tablet (40 mg total) by mouth daily. 02/22/18   Karamalegos, Netta Neat, DO  meloxicam (MOBIC) 15 MG tablet TAKE 1  TABLET BY MOUTH EVERY DAY 09/23/18   Karamalegos, Netta Neat, DO  potassium chloride (K-DUR,KLOR-CON) 10 MEQ tablet Take 1 tablet (10 mEq total) by mouth daily. 05/28/18   Karamalegos, Netta Neat, DO  pravastatin (PRAVACHOL) 20 MG tablet Take 1 tablet (20 mg total) by mouth at bedtime. 02/22/18   Smitty Cords, DO    Allergies Demerol [meperidine]; Macrobid [nitrofurantoin macrocrystal]; and Penicillins  Family History  Problem Relation Age of Onset  . Heart attack Mother   . Heart disease Mother   . Heart disease Father   . Heart attack Brother        death at age 54  . Stroke Brother        death at 38  . Bladder Cancer Neg Hx   . Kidney cancer Neg Hx     Social History Social History   Tobacco Use  . Smoking status: Never Smoker  . Smokeless tobacco: Never Used  Substance Use Topics  . Alcohol use: No    Alcohol/week: 0.0 standard drinks  . Drug use: No    Review of Systems Constitutional: No fever/chills Eyes: No visual changes. ENT: No sore throat. Cardiovascular: Denies chest pain. Respiratory: Denies shortness of breath. Gastrointestinal: No abdominal pain.  No nausea, no vomiting.  No diarrhea.  No constipation. Genitourinary: Negative for dysuria. Musculoskeletal: Negative for neck pain.  Negative for back pain.  Positive for left hip pain integumentary: Negative for rash. Neurological: Negative for headaches, focal weakness or numbness.   ____________________________________________   PHYSICAL EXAM:  VITAL SIGNS: ED Triage Vitals [11/21/18 0023]  Enc Vitals Group     BP      Pulse      Resp      Temp      Temp src      SpO2 97 %     Weight      Height      Head Circumference      Peak Flow      Pain Score      Pain Loc      Pain Edu?      Excl. in GC?     Constitutional: Alert and oriented.  Apparent discomfort  eyes: Conjunctivae are normal. PERRL. EOMI. Head: Atraumatic. Mouth/Throat: Mucous membranes are moist. Oropharynx  non-erythematous. Neck: No stridor.  No meningeal signs.  No cervical spine tenderness to palpation. Cardiovascular: Normal rate, regular rhythm. Good peripheral circulation. Grossly normal heart sounds. Respiratory: Normal respiratory effort.  No retractions. No audible wheezing. Gastrointestinal: Soft and nontender. No distention.  Musculoskeletal: Left leg warning with external rotation.  Pain with gentle palpation left hip. Neurologic:  Normal speech and language. No gross focal neurologic deficits are appreciated.  Skin:  Skin is warm, dry and intact. No rash noted. Psychiatric: Mood and affect are normal. Speech and behavior are normal.  ____________________________________________   LABS (all labs ordered are listed, but only abnormal results are displayed)  Labs Reviewed  CBC - Abnormal; Notable for the following components:  Result Value   WBC 13.0 (*)    Hemoglobin 11.7 (*)    HCT 35.9 (*)    All other components within normal limits  COMPREHENSIVE METABOLIC PANEL - Abnormal; Notable for the following components:   Glucose, Bld 174 (*)    All other components within normal limits  TROPONIN I  BASIC METABOLIC PANEL  CBC   ____________________________________________  EKG  ED ECG REPORT I, [place] N Nico Rogness, the attending physician, personally viewed and interpreted this ECG.   Date: 11/21/2018  EKG Time: 12:27 AM  Rate: 83  Rhythm: Normal sinus rhythm  Axis: Left axis deviation  Intervals: Normal  ST&T Change: None _____________________________________  RADIOLOGY I, [place] N Kynzee Devinney, personally viewed and evaluated these images (plain radiographs) as part of my medical decision making, as well as reviewing the written report by the radiologist.  ED MD interpretation: Comminuted left femur intertrochanteric fracture with impaction.  Chest x-ray revealed no acute findings.  Official radiology report(s): Dg Chest 1 View  Result Date: 11/21/2018 CLINICAL  DATA:  78 year old female status post fall with left hip fracture. EXAM: CHEST  1 VIEW COMPARISON:  Chest CT 12/31/2017 and earlier. FINDINGS: Supine AP view at 0058 hours. Stable lung volumes and mediastinal contours. Chronic pulmonary interstitial opacity which might correspond to a degree of developing fibrosis. No pneumothorax, pleural effusion or acute pulmonary opacity. Visualized tracheal air column is within normal limits. Stable visualized osseous structures. IMPRESSION: No acute cardiopulmonary abnormality. 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 Electronically Signed   By: Odessa Fleming M.D.   On: 11/21/2018 01:27   Dg Hip Unilat W Or Wo Pelvis 2-3 Views Left  Result Date: 11/21/2018 CLINICAL DATA:  78 year old female status post fall with left hip pain. EXAM: DG HIP (WITH OR WITHOUT PELVIS) 2-3V LEFT COMPARISON:  05/29/2016. FINDINGS: Comminuted left femur intertrochanteric fracture with varus impaction. Left femoral head remains normally located. Underlying osteopenia. No acute fracture of the pelvis identified. Proximal right femur appears stable and intact. Negative visible bowel gas pattern. IMPRESSION: Comminuted left femur intertrochanteric fracture with varus impaction. Electronically Signed   By: Odessa Fleming M.D.   On: 11/21/2018 01:26     Procedures   ____________________________________________   INITIAL IMPRESSION / MDM / ASSESSMENT AND PLAN / ED COURSE  As part of my medical decision making, I reviewed the following data within the electronic MEDICAL RECORD NUMBER   78 year old female presenting with above-stated history and physical exam concerning for left hip fracture which was confirmed on x-ray.  Patient received IV morphine shortly after my evaluation and additional doses since then with improvement of pain however the pain is now resolved.  Patient discussed with Dr. Martha Clan for hospital admission for surgical repair.  Patient also discussed with Dr. Arville Care hospitalist  who will admit the patient.  *Jill Hancock was evaluated in Emergency Department on 11/21/2018 for the symptoms described in the history of present illness. She was evaluated in the context of the global COVID-19 pandemic, which necessitated consideration that the patient might be at risk for infection with the SARS-CoV-2 virus that causes COVID-19. Institutional protocols and algorithms that pertain to the evaluation of patients at risk for COVID-19 are in a state of rapid change based on information released by regulatory bodies including the CDC and federal and state organizations. These policies and algorithms were followed during the patient's care in the ED.  Some ED evaluations and interventions may be delayed as a result of limited staffing during the pandemic.*  ____________________________________________  FINAL CLINICAL IMPRESSION(S) / ED DIAGNOSES  Final diagnoses:  Closed comminuted intertrochanteric fracture of left femur, initial encounter (HCC)     MEDICATIONS GIVEN DURING THIS VISIT:  Medications  morphine 2 MG/ML injection 2 mg (has no administration in time range)  amLODipine (NORVASC) tablet 10 mg (has no administration in time range)  lisinopril (ZESTRIL) tablet 40 mg (has no administration in time range)  pravastatin (PRAVACHOL) tablet 20 mg (has no administration in time range)  FLUoxetine (PROZAC) capsule 40 mg (has no administration in time range)  alendronate (FOSAMAX) tablet 70 mg (has no administration in time range)  pantoprazole (PROTONIX) EC tablet 40 mg (has no administration in time range)  Cranberry-Vitamin C 250-60 MG CAPS (has no administration in time range)  Vitamin D3 CAPS 5,000 Units (has no administration in time range)  potassium chloride SA (K-DUR) CR tablet 10 mEq (has no administration in time range)  ipratropium (ATROVENT) 0.06 % nasal spray 2 spray (has no administration in time range)  0.9 %  sodium chloride infusion (has no  administration in time range)  acetaminophen (TYLENOL) tablet 650 mg (has no administration in time range)    Or  acetaminophen (TYLENOL) suppository 650 mg (has no administration in time range)  oxyCODONE (Oxy IR/ROXICODONE) immediate release tablet 5 mg (has no administration in time range)  traZODone (DESYREL) tablet 25 mg (has no administration in time range)  magnesium hydroxide (MILK OF MAGNESIA) suspension 30 mL (has no administration in time range)  ondansetron (ZOFRAN) tablet 4 mg (has no administration in time range)    Or  ondansetron (ZOFRAN) injection 4 mg (has no administration in time range)  morphine 2 MG/ML injection 2 mg (has no administration in time range)  morphine 2 MG/ML injection 2 mg (2 mg Intravenous Given 11/21/18 0038)  ondansetron (ZOFRAN) injection 4 mg (4 mg Intravenous Given 11/21/18 0038)     ED Discharge Orders    None       Note:  This document was prepared using Dragon voice recognition software and may include unintentional dictation errors.   Darci Current, MD 11/21/18 [phone]

## 2018-11-21 NOTE — Anesthesia Preprocedure Evaluation (Signed)
Anesthesia Evaluation  Patient identified by MRN, date of birth, ID band Patient awake    Reviewed: Allergy & Precautions, NPO status , Patient's Chart, lab work & pertinent test results  History of Anesthesia Complications Negative for: history of anesthetic complications  Airway Mallampati: II  TM Distance: >3 FB Neck ROM: Full    Dental no notable dental hx.    Pulmonary neg pulmonary ROS, neg sleep apnea, neg COPD,    breath sounds clear to auscultation- rhonchi (-) wheezing      Cardiovascular hypertension, Pt. on medications (-) CAD, (-) Past MI, (-) Cardiac Stents and (-) CABG  Rhythm:Regular Rate:Normal - Systolic murmurs and - Diastolic murmurs    Neuro/Psych neg Seizures PSYCHIATRIC DISORDERS Anxiety Depression Dementia CVA, Residual Symptoms    GI/Hepatic Neg liver ROS, GERD  ,  Endo/Other  negative endocrine ROSneg diabetes  Renal/GU negative Renal ROS     Musculoskeletal  (+) Arthritis ,   Abdominal (+) - obese,   Peds  Hematology negative hematology ROS (+)   Anesthesia Other Findings Past Medical History: No date: Anxiety No date: Depression No date: Hyperglycemia No date: Hypertension No date: Hypokalemia No date: Osteoporosis 02/2012: Stroke [facility])     Comment:  MRI revealed at least 3 subcentimeter acute infarctions               with one area of subacute infarction in widely disparate               vascular territories including territory of left               cerebellum and around right caudate nucleus along with               widespread lacunar infarcts, chronic microvascular               ischemic change, and numerous microbleeds suggesting long              standing hypertensive cerebrovascular disease.  MRA               confirmed intracranial No date: Unsteady gait No date: Vertigo   Reproductive/Obstetrics                             Lab Results  Component  Value Date   WBC 18.1 (H) 11/21/2018   HGB 11.3 (L) 11/21/2018   HCT 34.3 (L) 11/21/2018   MCV 87.5 11/21/2018   PLT 335 11/21/2018    Anesthesia Physical Anesthesia Plan  ASA: III  Anesthesia Plan: Spinal   Post-op Pain Management:    Induction:   PONV Risk Score and Plan: 2 and Propofol infusion  Airway Management Planned: Natural Airway  Additional Equipment:   Intra-op Plan:   Post-operative Plan:   Informed Consent: I have reviewed the patients History and Physical, chart, labs and discussed the procedure including the risks, benefits and alternatives for the proposed anesthesia with the patient or authorized representative who has indicated his/her understanding and acceptance.     Dental advisory given  Plan Discussed with: CRNA and Anesthesiologist  Anesthesia Plan Comments:         Anesthesia Quick Evaluation

## 2018-11-21 NOTE — Progress Notes (Signed)
[employer] - [place] [facility]   PATIENT NAME: Jill Hancock    MR#:  [id number]  DATE OF BIRTH:  Sep 05, 1940  SUBJECTIVE:  CHIEF COMPLAINT:   Chief Complaint  Patient presents with  . Fall  . Leg Pain   -Patient status post mechanical fall and left hip fracture.  Status post surgery today.  Denies any complaints other than pain in the hip joint  REVIEW OF SYSTEMS:  Review of Systems  Constitutional: Positive for malaise/fatigue. Negative for chills and fever.  HENT: Negative for congestion, ear discharge, hearing loss and nosebleeds.   Eyes: Negative for blurred vision and double vision.  Respiratory: Negative for cough, shortness of breath and wheezing.   Cardiovascular: Negative for chest pain and palpitations.  Gastrointestinal: Negative for abdominal pain, constipation, diarrhea, nausea and vomiting.  Genitourinary: Negative for dysuria.  Musculoskeletal: Positive for joint pain and myalgias.  Neurological: Negative for dizziness, seizures and headaches.  Psychiatric/Behavioral: Negative for depression.    DRUG ALLERGIES:   Allergies  Allergen Reactions  . Demerol [Meperidine] Nausea And Vomiting  . Macrobid [Nitrofurantoin Macrocrystal] Rash  . Penicillins Rash    Has patient had a PCN reaction causing immediate rash, facial/tongue/throat swelling, SOB or lightheadedness with hypotension: Unknown Has patient had a PCN reaction causing severe rash involving mucus membranes or skin necrosis: Unknown Has patient had a PCN reaction that required hospitalization: Unknown Has patient had a PCN reaction occurring within the last 10 years: No If all of the above answers are "NO", then may proceed with Cephalosporin use.     VITALS:  Blood pressure (!) 105/56, pulse 90, temperature (!) 97.5 F (36.4 C), resp. rate 20, height 5\' 3"  (1.6 m), weight 68 kg, SpO2 93 %.  PHYSICAL EXAMINATION:  Physical Exam   GENERAL:  78 y.o.-year-old patient  lying in the bed with no acute distress.  EYES: Pupils equal, round, reactive to light and accommodation. No scleral icterus. Extraocular muscles intact.  HEENT: Head atraumatic, normocephalic. Oropharynx and nasopharynx clear.  NECK:  Supple, no jugular venous distention. No thyroid enlargement, no tenderness.  LUNGS: Normal breath sounds bilaterally, no wheezing, rales,rhonchi or crepitation. No use of accessory muscles of respiration.  Decreased bibasilar breath sounds. CARDIOVASCULAR: S1, S2 normal. No  rubs, or gallops.  2/6 systolic murmur is present ABDOMEN: Soft, nontender, nondistended. Bowel sounds present. No organomegaly or mass.  EXTREMITIES: No pedal edema, cyanosis, or clubbing.  NEUROLOGIC: Cranial nerves II through XII are intact. Muscle strength 5/5 in all extremities except left leg due to pain in the left hip. Sensation intact. Gait not checked.  PSYCHIATRIC: The patient is still groggy from anesthesia, easily arousable SKIN: No obvious rash, lesion, or ulcer.    LABORATORY PANEL:   CBC Recent Labs  Lab 11/21/18 0351  WBC 18.1*  HGB 11.3*  HCT 34.3*  PLT 335   ------------------------------------------------------------------------------------------------------------------  Chemistries  Recent Labs  Lab 11/21/18 0030 11/21/18 0351  NA 140 141  K 3.5 3.6  CL 106 106  CO2 23 24  GLUCOSE 174* 155*  BUN 16 18  CREATININE 0.82 0.80  CALCIUM 8.9 8.7*  AST 22  --   ALT 19  --   ALKPHOS 45  --   BILITOT 0.4  --    ------------------------------------------------------------------------------------------------------------------  Cardiac Enzymes Recent Labs  Lab 11/21/18 0030  TROPONINI <0.03   ------------------------------------------------------------------------------------------------------------------  RADIOLOGY:  Dg Chest 1 View  Result Date: 11/21/2018 CLINICAL DATA:  78 year old female status post fall  with left hip fracture. EXAM: CHEST  1  VIEW COMPARISON:  Chest CT 12/31/2017 and earlier. FINDINGS: Supine AP view at 0058 hours. Stable lung volumes and mediastinal contours. Chronic pulmonary interstitial opacity which might correspond to a degree of developing fibrosis. No pneumothorax, pleural effusion or acute pulmonary opacity. Visualized tracheal air column is within normal limits. Stable visualized osseous structures. IMPRESSION: No acute cardiopulmonary abnormality. 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 Electronically Signed   By: Odessa FlemingH  Hall M.D.   On: 11/21/2018 01:27   Dg Hip Operative Unilat W Or W/o Pelvis Left  Result Date: 11/21/2018 CLINICAL DATA:  ORIF of left femur fracture. EXAM: OPERATIVE LEFT HIP (WITH PELVIS IF PERFORMED) 5 VIEWS TECHNIQUE: Fluoroscopic spot image(s) were submitted for interpretation post-operatively. COMPARISON:  Left hip radiographs 11/21/2018 at 0056 hours FINDINGS: Five intraoperative fluoroscopic images are provided. A left femoral intramedullary nail has been placed with proximal and distal screws for fixation of the previously described intertrochanteric fracture. The main proximal and distal fragments are in grossly anatomic alignment on these limited images. There is mild displacement of the greater trochanter fragment. IMPRESSION: Intraoperative images during ORIF of left femur fracture. Electronically Signed   By: Sebastian AcheAllen  Grady M.D.   On: 11/21/2018 11:36   Dg Hip Unilat W Or Wo Pelvis 2-3 Views Left  Result Date: 11/21/2018 CLINICAL DATA:  78 year old female status post fall with left hip pain. EXAM: DG HIP (WITH OR WITHOUT PELVIS) 2-3V LEFT COMPARISON:  05/29/2016. FINDINGS: Comminuted left femur intertrochanteric fracture with varus impaction. Left femoral head remains normally located. Underlying osteopenia. No acute fracture of the pelvis identified. Proximal right femur appears stable and intact. Negative visible bowel gas pattern. IMPRESSION: Comminuted left femur  intertrochanteric fracture with varus impaction. Electronically Signed   By: Odessa FlemingH  Hall M.D.   On: 11/21/2018 01:26    EKG:   Orders placed or performed during the hospital encounter of 11/21/18  . ED EKG  . ED EKG  . EKG 12-Lead  . EKG 12-Lead    ASSESSMENT AND PLAN:   78 year old female with past medical history significant for hypertension, depression anxiety from home comes after   mechanical fall and left hip pain.  1.  Fall and community left femoral intertrochanteric fracture-appreciate orthopedics consult -Status post surgery today.  Pain control, Foley will be discontinued tomorrow -Physical therapy tomorrow -Might need rehab at discharge.  2.  Hypertension-continue home medications.  Patient on Norvasc, lisinopril  3.  GERD-Protonix  4.  Leukocytosis-could be stress margination.  However check urine analysis  5.  DVT prophylaxis-Lovenox started today   All the records are reviewed and case discussed with Care Management/Social Workerr. Management plans discussed with the patient, family and they are in agreement.  CODE STATUS: Full code  TOTAL TIME TAKING CARE OF THIS PATIENT: 39 minutes.   POSSIBLE D/C IN 2 DAYS, DEPENDING ON CLINICAL CONDITION.   Enid Baasadhika Sheila Ocasio M.D on 11/21/2018 at 1:51 PM  Between 7am to 6pm - Pager - [phone]  After 6pm go to [email or web address] - Social research officer, governmentpassword [email or web address]  [facility]  Office  [phone]  CC: Primary care physician; Smitty CordsKaramalegos, Alexander J, DO

## 2018-11-21 NOTE — ED Notes (Signed)
Patient transported to X-ray 

## 2018-11-21 NOTE — Consult Note (Signed)
ORTHOPAEDIC CONSULTATION  REQUESTING PHYSICIAN: Enid BaasKalisetti, Radhika, MD  Chief Complaint: Left hip pain status post fall  HPI: Jill Hancock is a 78 y.o. female who complains of left hip pain status post fall overnight at home.  Patient sustained a mechanical fall landing on her left side.  Patient at the bedside this morning is complaining of left hip and low back pain but denies other injuries.  X-ray films in the emergency department revealed a displaced left intertrochanteric hip fracture.  Patient was admitted to the medical service for preoperative clearance.  Orthopedics is consulted to manage the patient's fracture.  Past Medical History:  Diagnosis Date  . Anxiety   . Depression   . Hyperglycemia   . Hypertension   . Hypokalemia   . Osteoporosis   . Stroke [facility](HCC) 02/2012   MRI revealed at least 3 subcentimeter acute infarctions with one area of subacute infarction in widely disparate vascular territories including territory of left cerebellum and around right caudate nucleus along with widespread lacunar infarcts, chronic microvascular ischemic change, and numerous microbleeds suggesting long standing hypertensive cerebrovascular disease.  MRA confirmed intracranial  . Unsteady gait   . Vertigo    Past Surgical History:  Procedure Laterality Date  . BREAST BIOPSY Right    pt not sure when   . CHOLECYSTECTOMY    . VAGINAL HYSTERECTOMY     Social History   Socioeconomic History  . Marital status: Divorced    Spouse name: Not on file  . Number of children: 5  . Years of education: Graduate - [employer]  . Highest education level: Associate degree: occupational, Scientist, product/process developmenttechnical, or vocational program  Occupational History  . Occupation: Retired Production assistant, radioN  Social Needs  . Financial resource strain: Not hard at all  . Food insecurity:    Worry: Never true    Inability: Never true  . Transportation needs:    Medical: No    Non-medical: No  Tobacco Use  . Smoking status: Never Smoker   . Smokeless tobacco: Never Used  Substance and Sexual Activity  . Alcohol use: No    Alcohol/week: 0.0 standard drinks  . Drug use: No  . Sexual activity: Yes  Lifestyle  . Physical activity:    Days per week: 0 days    Minutes per session: 0 min  . Stress: Only a little  Relationships  . Social connections:    Talks on phone: Once a week    Gets together: Twice a week    Attends religious service: Never    Active member of club or organization: No    Attends meetings of clubs or organizations: Never    Relationship status: Divorced  Other Topics Concern  . Not on file  Social History Narrative   Lives at home alone, has 4 daughters that come and help daily   Family History  Problem Relation Age of Onset  . Heart attack Mother   . Heart disease Mother   . Heart disease Father   . Heart attack Brother        death at age 79  . Stroke Brother        death at 661  . Bladder Cancer Neg Hx   . Kidney cancer Neg Hx    Allergies  Allergen Reactions  . Demerol [Meperidine] Nausea And Vomiting  . Macrobid [Nitrofurantoin Macrocrystal] Rash  . Penicillins Rash    Has patient had a PCN reaction causing immediate rash, facial/tongue/throat swelling, SOB or lightheadedness with hypotension:  Unknown Has patient had a PCN reaction causing severe rash involving mucus membranes or skin necrosis: Unknown Has patient had a PCN reaction that required hospitalization: Unknown Has patient had a PCN reaction occurring within the last 10 years: No If all of the above answers are "NO", then may proceed with Cephalosporin use.    Prior to Admission medications   Medication Sig Start Date End Date Taking? Authorizing Provider  acetaminophen (TYLENOL) 500 MG tablet Take 500 mg by mouth every 6 (six) hours as needed for mild pain or headache.     [provider]  alendronate (FOSAMAX) 70 MG tablet TAKE 1 TABLET EVERY 7 DAYS.  SEE PACKAGE FOR ADDITIONAL INSTRUCTIONS 02/22/18    Smitty Cords, DO  amLODipine (NORVASC) 10 MG tablet TAKE 1 TABLET BY MOUTH EVERY DAY 08/26/18   Althea Charon, Netta Neat, DO  aspirin 81 MG tablet Take 1 tablet (81 mg total) by mouth daily. 03/07/13   Ronal Fear, NP  Cholecalciferol (VITAMIN D3) 125 MCG (5000 UT) CAPS Take 1 capsule (5,000 Units total) by mouth daily. For 12 weeks, then start Vitamin D3 2,000 units daily (OTC) 07/29/18   Karamalegos, Netta Neat, DO  Cranberry-Vitamin C (AZO CRANBERRY URINARY TRACT PO) Take 2 tablets by mouth daily. 03/20/18   [provider]  dimenhyDRINATE (DRAMAMINE) 50 MG tablet Take 50 mg by mouth every 8 (eight) hours as needed.    [provider]  esomeprazole (NEXIUM) 20 MG capsule Take 1 capsule (20 mg total) by mouth daily before breakfast. 12/14/17   Althea Charon, Netta Neat, DO  FLUoxetine (PROZAC) 40 MG capsule Take 1 capsule (40 mg total) by mouth daily. 05/28/18   Karamalegos, Netta Neat, DO  ipratropium (ATROVENT) 0.06 % nasal spray Place 2 sprays into both nostrils 4 (four) times daily. For up to 5-7 days then stop. 04/10/18   Karamalegos, Netta Neat, DO  lisinopril (PRINIVIL,ZESTRIL) 40 MG tablet Take 1 tablet (40 mg total) by mouth daily. 02/22/18   Karamalegos, Netta Neat, DO  meloxicam (MOBIC) 15 MG tablet TAKE 1 TABLET BY MOUTH EVERY DAY 09/23/18   Karamalegos, Netta Neat, DO  potassium chloride (K-DUR,KLOR-CON) 10 MEQ tablet Take 1 tablet (10 mEq total) by mouth daily. 05/28/18   Karamalegos, Netta Neat, DO  pravastatin (PRAVACHOL) 20 MG tablet Take 1 tablet (20 mg total) by mouth at bedtime. 02/22/18   Smitty Cords, DO   Dg Chest 1 View  Result Date: 11/21/2018 CLINICAL DATA:  78 year old female status post fall with left hip fracture. EXAM: CHEST  1 VIEW COMPARISON:  Chest CT 12/31/2017 and earlier. FINDINGS: Supine AP view at 0058 hours. Stable lung volumes and mediastinal contours. Chronic pulmonary interstitial opacity which might correspond to a  degree of developing fibrosis. No pneumothorax, pleural effusion or acute pulmonary opacity. Visualized tracheal air column is within normal limits. Stable visualized osseous structures. IMPRESSION: No acute cardiopulmonary abnormality. 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 Electronically Signed   By: Odessa Fleming M.D.   On: 11/21/2018 01:27   Dg Hip Unilat W Or Wo Pelvis 2-3 Views Left  Result Date: 11/21/2018 CLINICAL DATA:  78 year old female status post fall with left hip pain. EXAM: DG HIP (WITH OR WITHOUT PELVIS) 2-3V LEFT COMPARISON:  05/29/2016. FINDINGS: Comminuted left femur intertrochanteric fracture with varus impaction. Left femoral head remains normally located. Underlying osteopenia. No acute fracture of the pelvis identified. Proximal right femur appears stable and intact. Negative visible bowel gas pattern. IMPRESSION: Comminuted left femur intertrochanteric fracture with  varus impaction. Electronically Signed   By: Odessa Fleming M.D.   On: 11/21/2018 01:26    Positive ROS: All other systems have been reviewed and were otherwise negative with the exception of those mentioned in the HPI and as above.  Physical Exam: General: Alert, no acute distress  MUSCULOSKELETAL: Left lower extremity: Patient skin is intact.  There is no erythema ecchymosis or significant swelling.  Patient's left lower extremity is shortened and externally rotated.  Patient has palpable pedal pulses, intact sensation to light touch throughout the left lower extremity and intact motor function distally.  She is tender over the left hip to palpation.  Assessment: Displaced left intertrochanteric hip fracture  Plan: I have explained the fracture to the patient.  I discussed the plan for surgical fixation this morning.  Patient asked me to also call her daughter.  I called her daughter, Jill Hancock, and explained the details of the operation as well as the postoperative course.  I discussed the risks and  benefits of surgery with Ms. Boone.  She understands the risks include but are not limited to infection, bleeding requiring blood transfusion, nerve or blood vessel injury, joint stiffness or loss of motion, persistent pain, weakness or instability, malunion, nonunion and hardware failure and the need for further surgery. Medical risks include but are not limited to DVT and pulmonary embolism, myocardial infarction, stroke, pneumonia, respiratory failure and death. The patient and her daughter understood the risks of surgery and wished to proceed.   Patient has been cleared for surgery by the hospitalist service.  Patient's INR and PTT are within normal limits.  Patient has been n.p.o. since admission.  Plan for surgical fixation of left intertrochanteric hip fracture this morning.    Juanell Fairly, MD    11/21/2018 8:58 AM

## 2018-11-21 NOTE — Op Note (Signed)
DATE OF SURGERY:  11/21/2018  TIME: 11:16 AM  PATIENT NAME:  Jill Hancock  AGE: 78 y.o.  PRE-OPERATIVE DIAGNOSIS:  Displaced Left Intertrochanteric Hip Fracture  POST-OPERATIVE DIAGNOSIS:  SAME  PROCEDURE:  INTRAMEDULLARY (IM) FIXATION OF LEFT INTERTROCHANTERIC HIP FRACTURE  SURGEON:  Juanell Fairly  OPERATIVE IMPLANTS: Biomet short Affixus nail 11 x 180, 95 mm lag screw with a 36 mm distal interlocking screw  PREOPERATIVE INDICATIONS:  Jill Hancock is a 78 y.o. year old who fell and suffered a hip fracture. She was brought into the ER and then admitted and medically cleared for surgical intervention.    The risks, benefits and alternatives were discussed with the patient and their family.  The risks include but are not limited to infection, bleeding, nerve or blood vessel injury, malunion, nonunion, hardware prominence, hardware failure, change in leg lengths or lower extremity rotation need for further surgery including hardware removal with conversion to a total hip arthroplasty. Medical risks include but are not limited to DVT and pulmonary embolism, myocardial infarction, stroke, pneumonia, respiratory failure and death. The patient and her daughter understood these risks and wished to proceed with surgery.  OPERATIVE PROCEDURE:  The patient was brought to the operating room and placed in the supine position on the fracture table. Spinal anesthesia was administered.  A time out was performed to verify the patient's name, date of birth, medical record number, correct site of surgery correct procedure to be performed. The timeout was also used to verify the patient received antibiotics and all appropriate instruments, implants and radiographic studies were available in the room. Once all in attendance were in agreement, the case began. A closed reduction was performed under C-arm guidance.  The fracture reduction was confirmed on both AP and lateral views.  The patient was prepped  and draped in a sterile fashion. She received preoperative antibiotics with 1 gram of IV Vancomycin due to her PCN allergy.  An incision was made proximal to the greater trochanter in line with the femur. A guidewire was placed over the tip of the greater trochanter and advanced into the proximal femur to the level of the lesser trochanter.  Confirmation of the drill pin position was made on AP and lateral C-arm images.  The threaded guidepin was then overdrilled with the proximal femoral drill.  The nail was then inserted into the proximal femur, across the fracture site and into the femoral shaft. Its position was confirmed on AP and lateral C-arm images.   Once the nail was completely seated, the drill guide for the lag screw was placed through the guide arm for the Affixus nail. A guidepin was then placed through this drill guide and advanced through the lateral cortex of the femur, across the fracture site and into the femoral head achieving a tip apex distance of less than 25 mm. The depth of the drill pin was measured to 95 mm, and then the drill for the lag screw was advanced over the guidepin and through the lateral cortex, across the fracture site and up into the femoral head to the depth of 95 mm.  The lag screw was then advanced by hand into position across the fracture site into the femoral head. Its final position was confirmed on AP and lateral C-arm images. Compression was applied as traction was carefully released. The set screw in the top of the intramedullary rod was tightened by hand using a screwdriver. It was backed off a quarter turn to allow for  compression at the fracture site.  The drill sleeve for the distal interlocking screw was then placed through the Affixus guide arm. A small stab incision was made to allow the drill guide to approximate the lateral cortex of the femur. The drill for the distal interlocking screw was then advanced bicortically. The length of this drill was  measured. A distal interlocking screw with the length of 36 was then inserted by hand through the guide arm. Final C-arm images of the entire intramedullary construct were taken in both the AP and lateral planes.   The wounds were irrigated copiously and closed with 0 Vicryl for closure of the deep fascia and 2-0 Vicryl for subcutaneous closure. The skin was approximated with staples. A dry sterile dressing was applied. I was scrubbed and present the entire case and all sharp and instrument counts were correct at the conclusion of the case. Patient was transferred to hospital bed and brought to PACU in stable condition. I spoke with the patient's daughter by phone to let her know the case had been performed without complication and the patient was stable in the recovery room.     Kathreen DevoidKevin L Dodge Ator, MD

## 2018-11-21 NOTE — ED Notes (Signed)
Daughter Tyron Russell [phone]

## 2018-11-21 NOTE — Op Note (Signed)
Subjective:  POST-OP CHECK:  Patient comfortable in PACU.  No complaints.  Objective:   VITALS:   Vitals:   11/21/18 1149 11/21/18 1200 11/21/18 1215 11/21/18 1218  BP: 98/61 104/65  100/61  Pulse: 86 89 89 90  Resp: 17 19 20  (!) 24  Temp:   97.7 F (36.5 C)   TempSrc:      SpO2: 100% 99% 96% 98%  Weight:      Height:        PHYSICAL EXAM: Left lower extremity: Neurovascular intact Sensation intact distally Intact pulses distally Dorsiflexion/Plantar flexion intact Incision: dressing C/D/I No cellulitis present Compartment soft  LABS  Results for orders placed or performed during the hospital encounter of 11/21/18 (from the past 24 hour(s))  CBC     Status: Abnormal   Collection Time: 11/21/18 12:30 AM  Result Value Ref Range   WBC 13.0 (H) 4.0 - 10.5 K/uL   RBC 4.15 3.87 - 5.11 MIL/uL   Hemoglobin 11.7 (L) 12.0 - 15.0 g/dL   HCT 99.3 (L) 57.0 - 17.7 %   MCV 86.5 80.0 - 100.0 fL   MCH 28.2 26.0 - 34.0 pg   MCHC 32.6 30.0 - 36.0 g/dL   RDW 93.9 03.0 - 09.2 %   Platelets 360 150 - 400 K/uL   nRBC 0.0 0.0 - 0.2 %  Comprehensive metabolic panel     Status: Abnormal   Collection Time: 11/21/18 12:30 AM  Result Value Ref Range   Sodium 140 135 - 145 mmol/L   Potassium 3.5 3.5 - 5.1 mmol/L   Chloride 106 98 - 111 mmol/L   CO2 23 22 - 32 mmol/L   Glucose, Bld 174 (H) 70 - 99 mg/dL   BUN 16 8 - 23 mg/dL   Creatinine, Ser 3.30 0.44 - 1.00 mg/dL   Calcium 8.9 8.9 - 07.6 mg/dL   Total Protein 7.2 6.5 - 8.1 g/dL   Albumin 3.6 3.5 - 5.0 g/dL   AST 22 15 - 41 U/L   ALT 19 0 - 44 U/L   Alkaline Phosphatase 45 38 - 126 U/L   Total Bilirubin 0.4 0.3 - 1.2 mg/dL   GFR calc non Af Amer >60 >60 mL/min   GFR calc Af Amer >60 >60 mL/min   Anion gap 11 5 - 15  Troponin I - ONCE - STAT     Status: None   Collection Time: 11/21/18 12:30 AM  Result Value Ref Range   Troponin I <0.03 <0.03 ng/mL  SARS Coronavirus 2 (CEPHEID - Performed in [facility] hospital lab), Hosp  Order     Status: None   Collection Time: 11/21/18  2:39 AM  Result Value Ref Range   SARS Coronavirus 2 NEGATIVE NEGATIVE  Surgical PCR screen     Status: None   Collection Time: 11/21/18  3:35 AM  Result Value Ref Range   MRSA, PCR NEGATIVE NEGATIVE   Staphylococcus aureus NEGATIVE NEGATIVE  Basic metabolic panel     Status: Abnormal   Collection Time: 11/21/18  3:51 AM  Result Value Ref Range   Sodium 141 135 - 145 mmol/L   Potassium 3.6 3.5 - 5.1 mmol/L   Chloride 106 98 - 111 mmol/L   CO2 24 22 - 32 mmol/L   Glucose, Bld 155 (H) 70 - 99 mg/dL   BUN 18 8 - 23 mg/dL   Creatinine, Ser 2.26 0.44 - 1.00 mg/dL   Calcium 8.7 (L) 8.9 - 10.3 mg/dL  GFR calc non Af Amer >60 >60 mL/min   GFR calc Af Amer >60 >60 mL/min   Anion gap 11 5 - 15  CBC     Status: Abnormal   Collection Time: 11/21/18  3:51 AM  Result Value Ref Range   WBC 18.1 (H) 4.0 - 10.5 K/uL   RBC 3.92 3.87 - 5.11 MIL/uL   Hemoglobin 11.3 (L) 12.0 - 15.0 g/dL   HCT 16.1 (L) 09.6 - 04.5 %   MCV 87.5 80.0 - 100.0 fL   MCH 28.8 26.0 - 34.0 pg   MCHC 32.9 30.0 - 36.0 g/dL   RDW 40.9 81.1 - 91.4 %   Platelets 335 150 - 400 K/uL   nRBC 0.0 0.0 - 0.2 %  Hemoglobin A1c     Status: Abnormal   Collection Time: 11/21/18  3:51 AM  Result Value Ref Range   Hgb A1c MFr Bld 5.7 (H) 4.8 - 5.6 %   Mean Plasma Glucose 116.89 mg/dL  Protime-INR     Status: None   Collection Time: 11/21/18  6:00 AM  Result Value Ref Range   Prothrombin Time 12.6 11.4 - 15.2 seconds   INR 1.0 0.8 - 1.2  APTT     Status: None   Collection Time: 11/21/18  6:00 AM  Result Value Ref Range   aPTT 26 24 - 36 seconds    Dg Chest 1 View  Result Date: 11/21/2018 CLINICAL DATA:  78 year old female status post fall with left hip fracture. EXAM: CHEST  1 VIEW COMPARISON:  Chest CT 12/31/2017 and earlier. FINDINGS: Supine AP view at 0058 hours. Stable lung volumes and mediastinal contours. Chronic pulmonary interstitial opacity which might correspond to  a degree of developing fibrosis. No pneumothorax, pleural effusion or acute pulmonary opacity. Visualized tracheal air column is within normal limits. Stable visualized osseous structures. IMPRESSION: No acute cardiopulmonary abnormality. 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 Electronically Signed   By: Odessa Fleming M.D.   On: 11/21/2018 01:27   Dg Hip Operative Unilat W Or W/o Pelvis Left  Result Date: 11/21/2018 CLINICAL DATA:  ORIF of left femur fracture. EXAM: OPERATIVE LEFT HIP (WITH PELVIS IF PERFORMED) 5 VIEWS TECHNIQUE: Fluoroscopic spot image(s) were submitted for interpretation post-operatively. COMPARISON:  Left hip radiographs 11/21/2018 at 0056 hours FINDINGS: Five intraoperative fluoroscopic images are provided. A left femoral intramedullary nail has been placed with proximal and distal screws for fixation of the previously described intertrochanteric fracture. The main proximal and distal fragments are in grossly anatomic alignment on these limited images. There is mild displacement of the greater trochanter fragment. IMPRESSION: Intraoperative images during ORIF of left femur fracture. Electronically Signed   By: Sebastian Ache M.D.   On: 11/21/2018 11:36   Dg Hip Unilat W Or Wo Pelvis 2-3 Views Left  Result Date: 11/21/2018 CLINICAL DATA:  78 year old female status post fall with left hip pain. EXAM: DG HIP (WITH OR WITHOUT PELVIS) 2-3V LEFT COMPARISON:  05/29/2016. FINDINGS: Comminuted left femur intertrochanteric fracture with varus impaction. Left femoral head remains normally located. Underlying osteopenia. No acute fracture of the pelvis identified. Proximal right femur appears stable and intact. Negative visible bowel gas pattern. IMPRESSION: Comminuted left femur intertrochanteric fracture with varus impaction. Electronically Signed   By: Odessa Fleming M.D.   On: 11/21/2018 01:26    Assessment/Plan: Day of Surgery   Active Problems:   Closed left hip fracture [facility])  Patient  doing well postop.  She will complete 24 hours of postop antibiotics.  Her Foley catheter will be removed in the morning.  Patient is doing well with current pain management.  Her spinal block is wearing off and she is neurovascular intact.  I have reviewed the postop x-rays which show the fracture is well reduced and the intramedullary hardware is well-positioned.    Juanell FairlyKevin Mohannad Olivero , MD 11/21/2018, 12:22 PM

## 2018-11-21 NOTE — Transfer of Care (Signed)
Immediate Anesthesia Transfer of Care Note  Patient: Jill Hancock  Procedure(s) Performed: INTRAMEDULLARY (IM) NAIL INTERTROCHANTRIC (Left Hip)  Patient Location: PACU  Anesthesia Type:Spinal  Level of Consciousness: awake and alert   Airway & Oxygen Therapy: Patient Spontanous Breathing and Patient connected to face mask oxygen  Post-op Assessment: Report given to RN and Post -op Vital signs reviewed and stable  Post vital signs: Reviewed  Last Vitals:  Vitals Value Taken Time  BP 88/54 11/21/2018 11:18 AM  Temp    Pulse 85 11/21/2018 11:18 AM  Resp 17 11/21/2018 11:18 AM  SpO2 99 % 11/21/2018 11:18 AM  Vitals shown include unvalidated device data.  Last Pain:  Vitals:   11/21/18 0832  TempSrc: Tympanic  PainSc: 0-No pain         Complications: No apparent anesthesia complications

## 2018-11-21 NOTE — TOC Progression Note (Signed)
Transition of Care [facility]) - Progression Note    Patient Details  Name: Jill Hancock MRN: [id number] Date of Birth: [birth year]  [facility]) CM/SW Contact  Barrie Dunker, RN Phone Number: 11/21/2018, 2:26 PM  Clinical Narrative:    Lovenox price if needed is 214.88 co pay for generic or with [facility] it is 90.52 at [facility], if provider orders it I will provide the patient with a Good [facility] coupon card.         Expected Discharge Plan and Services           Expected Discharge Date: 11/25/18                                     Social Determinants of Health (SDOH) Interventions    Readmission Risk Interventions No flowsheet data found.

## 2018-11-21 NOTE — Anesthesia Procedure Notes (Addendum)
Spinal  Patient location during procedure: OR Start time: 11/21/2018 9:38 AM End time: 11/21/2018 9:49 AM Staffing Anesthesiologist: Emmie Niemann, MD Resident/CRNA: Rolla Plate, CRNA Performed: resident/CRNA and anesthesiologist  Preanesthetic Checklist Completed: patient identified, site marked, surgical consent, pre-op evaluation, timeout performed, IV checked, risks and benefits discussed and monitors and equipment checked Spinal Block Patient position: right lateral decubitus Prep: ChloraPrep Patient monitoring: heart rate, continuous pulse ox and blood pressure Approach: midline Location: L4-5 Injection technique: single-shot Needle Needle type: Introducer and Pencil-Tip  Needle gauge: 24 G Needle length: 9 cm Additional Notes Negative paresthesia. Negative blood return. Positive free-flowing CSF. Expiration date of kit checked and confirmed. Patient tolerated procedure well, without complications.

## 2018-11-22 ENCOUNTER — Inpatient Hospital Stay: Payer: Medicare HMO

## 2018-11-22 LAB — CBC
HCT: 26.2 % — ABNORMAL LOW (ref 36.0–46.0)
Hemoglobin: 8.5 g/dL — ABNORMAL LOW (ref 12.0–15.0)
MCH: 28.8 pg (ref 26.0–34.0)
MCHC: 32.4 g/dL (ref 30.0–36.0)
MCV: 88.8 fL (ref 80.0–100.0)
Platelets: 226 10*3/uL (ref 150–400)
RBC: 2.95 MIL/uL — ABNORMAL LOW (ref 3.87–5.11)
RDW: 14.2 % (ref 11.5–15.5)
WBC: 12.1 10*3/uL — ABNORMAL HIGH (ref 4.0–10.5)
nRBC: 0 % (ref 0.0–0.2)

## 2018-11-22 LAB — BASIC METABOLIC PANEL
Anion gap: 8 (ref 5–15)
BUN: 14 mg/dL (ref 8–23)
CO2: 23 mmol/L (ref 22–32)
Calcium: 7.2 mg/dL — ABNORMAL LOW (ref 8.9–10.3)
Chloride: 106 mmol/L (ref 98–111)
Creatinine, Ser: 0.67 mg/dL (ref 0.44–1.00)
GFR calc Af Amer: >60 mL/min (ref >60)
GFR calc non Af Amer: >60 mL/min (ref >60)
Glucose, Bld: 139 mg/dL — ABNORMAL HIGH (ref 70–99)
Potassium: 3.3 mmol/L — ABNORMAL LOW (ref 3.5–5.1)
Sodium: 137 mmol/L (ref 135–145)

## 2018-11-22 LAB — URINALYSIS, COMPLETE (UACMP) WITH MICROSCOPIC
Bilirubin Urine: NEGATIVE
Glucose, UA: NEGATIVE mg/dL
Hgb urine dipstick: NEGATIVE
Ketones, ur: 20 mg/dL — AB
Nitrite: NEGATIVE
Protein, ur: NEGATIVE mg/dL
Specific Gravity, Urine: 1.015 (ref 1.005–1.030)
pH: 7 (ref 5.0–8.0)

## 2018-11-22 MED ORDER — POTASSIUM CHLORIDE CRYS ER 20 MEQ PO TBCR
40.0000 meq | EXTENDED_RELEASE_TABLET | Freq: Once | ORAL | Status: AC
  Administered 2018-11-22: 40 meq via ORAL
  Filled 2018-11-22: qty 2

## 2018-11-22 NOTE — TOC Initial Note (Addendum)
Transition of Care [facility]) - Initial/Assessment Note    Patient Details  Name: JOHNANNA Hancock MRN: [id number] Date of Birth: 26-Sep-1940  Transition of Care [facility]) CM/SW Contact:    Su Hilt, RN Phone Number: 11/22/2018, 11:51 AM  Clinical Narrative:                 Met with the patient to discuss DC plan and needs, she is alert to self and place but a poor historian she gave me permission to speak with the daughter, I called Cecille Rubin and gathered information, Cecille Rubin said the patient lives alone in an apartment for the elderly.  She walks with a rollator that she has at home.  I explained that PT is recommending that the patient go to rehab for therapy, Cecille Rubin stated that the patient has been to rehab several times for falls and breaks and that she had already called [facility] and spoke to Hurley and that they do have a bed, She is aware of the covid restrictions and that they have had 2 cases in employees,  she gives permission to request a Bed for the patient.  I called Tina at [facility] and confirmed that they do have a bed. Otila Kluver said yes they do and they would be looking for the patient via the Hub to make the offer. I completed the FL2 and the PASSR and sent thru the Hub will call [facility] to determine if they manage the care for [facility] or not   Expected Discharge Plan: [place] Barriers to Discharge: Continued Medical Work up   Patient Goals and CMS Choice Patient states their goals for this hospitalization and ongoing recovery are:: get better      Expected Discharge Plan and Services Expected Discharge Plan: [place]   Discharge Planning Services: CM Consult   Living arrangements for the past 2 months: Apartment Expected Discharge Date: 11/25/18                                    Prior Living Arrangements/Services Living arrangements for the past 2 months: Apartment Lives with:: Self Patient language and need for interpreter reviewed::  No Do you feel safe going back to the place where you live?: Yes      Need for Family Participation in Patient Care: Yes (Comment) Care giver support system in place?: Yes (comment) Current home services: DME(rollator) Criminal Activity/Legal Involvement Pertinent to Current Situation/Hospitalization: No - Comment as needed  Activities of Daily Living Home Assistive Devices/Equipment: Gilford Rile (specify type) ADL Screening (condition at time of admission) Patient's cognitive ability adequate to safely complete daily activities?: Yes Is the patient deaf or have difficulty hearing?: No Does the patient have difficulty seeing, even when wearing glasses/contacts?: No Does the patient have difficulty concentrating, remembering, or making decisions?: No Patient able to express need for assistance with ADLs?: Yes Does the patient have difficulty dressing or bathing?: Yes Independently performs ADLs?: Yes (appropriate for developmental age) Does the patient have difficulty walking or climbing stairs?: Yes Weakness of Legs: None Weakness of Arms/Hands: None  Permission Sought/Granted Permission sought to share information with : Case Manager Permission granted to share information with : Yes, Verbal Permission Granted     Permission granted to share info w AGENCY: [facility]        Emotional Assessment Appearance:: Appears stated age Attitude/Demeanor/Rapport: Engaged Affect (typically observed): Accepting Orientation: : Oriented to  Self, Oriented to Place Alcohol / Substance Use: Never Used Psych Involvement: No (comment)  Admission diagnosis:  Fall [W19.XXXA] Closed comminuted intertrochanteric fracture of left femur, initial encounter [facility]) [S72.142A] Patient Active Problem List   Diagnosis Date Noted  . Closed left hip fracture ([place]) 11/21/2018  . Pre-diabetes 07/25/2018  . Multiple rib fractures 12/31/2017  . Primary osteoarthritis of right knee 12/14/2017  . 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 Right shoulder injury 11/12/2015  . Urinary urgency 09/17/2015  . Hyperglycemia 07/29/2015  . Restless leg 06/07/2015  . Benign paroxysmal positional vertigo 06/07/2015  . Hyperlipidemia 04/16/2015  . Vitamin D deficiency 08/13/2014  . T12 compression fracture ([place]) 04/28/2014  . 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 Major depression, recurrent, chronic ([place]) 11/24/2012  . GERD (gastroesophageal reflux disease) 11/24/2012  . Osteoporosis 09/05/2012  . Right hip pain 08/23/2012  . Anxiety 05/04/2012  . Hypertension 02/27/2012  . History of cerebrovascular accident (CVA) with residual deficit 02/25/2012  . Unsteady gait 02/25/2012   PCP:  Olin Hauser, DO Pharmacy:   [id number] [id number]: [phone]: [phone]    Social Determinants of Health (SDOH) Interventions    Readmission Risk Interventions No flowsheet data found.

## 2018-11-22 NOTE — NC FL2 (Addendum)
[place] MEDICAID FL2 LEVEL OF CARE SCREENING TOOL     IDENTIFICATION  Patient Name: Jill Hancock Birthdate: 06-20-1941 Sex: female Admission Date (Current Location): 11/21/2018  [place] and [place] Number:  Chiropodist and Address:  [facility], [address], [place], [place] [address]      Provider Number: [id number]  Attending Physician Name and Address:  Enid Baas, MD  Relative Name and Phone Number:  [name] Daughter [phone]    Current Level of Care: Hospital Recommended Level of Care: Skilled Nursing Facility Prior Approval Number:    Date Approved/Denied: 11/22/18 PASRR Number: [id number]  Discharge Plan: SNF    Current Diagnoses: Patient Active Problem List   Diagnosis Date Noted  . Closed left hip fracture (H[facility]) 11/21/2018  . Pre-diabetes 07/25/2018  . Multiple rib fractures 12/31/2017  . Primary osteoarthritis of right knee 12/14/2017  . Dementia, vascular (HCC) 12/04/2017  . Right shoulder injury 11/12/2015  . Urinary urgency 09/17/2015  . Hyperglycemia 07/29/2015  . Restless leg 06/07/2015  . Benign paroxysmal positional vertigo 06/07/2015  . Hyperlipidemia 04/16/2015  . Vitamin D deficiency 08/13/2014  . T12 compression fracture (HCC) 04/28/2014  . 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 Major depression, recurrent, chronic (HCC) 11/24/2012  . GERD (gastroesophageal reflux disease) 11/24/2012  . Osteoporosis 09/05/2012  . Right hip pain 08/23/2012  . Anxiety 05/04/2012  . Hypertension 02/27/2012  . History of cerebrovascular accident (CVA) with residual deficit 02/25/2012  . Unsteady gait 02/25/2012    Orientation RESPIRATION BLADDER Height & Weight     Self, Place  Normal Continent Weight: 68 kg Height:  5\' 3"  (160 cm)  BEHAVIORAL SYMPTOMS/MOOD NEUROLOGICAL BOWEL NUTRITION STATUS      Continent Diet  AMBULATORY STATUS COMMUNICATION OF NEEDS Skin   Extensive Assist Verbally Normal,  Surgical wounds                       Personal Care Assistance Level of Assistance  Bathing, Dressing Bathing Assistance: Limited assistance   Dressing Assistance: Limited assistance     Functional Limitations Info  Sight, Hearing, Speech Sight Info: Adequate Hearing Info: Adequate Speech Info: Adequate    SPECIAL CARE FACTORS FREQUENCY  PT (By licensed PT), OT (By licensed OT)     PT Frequency: 5 times per week OT Frequency: 5 times per week            Contractures Contractures Info: Not present    Additional Factors Info  Code Status, Allergies Code Status Info: full Allergies Info: Demerol [place], Macrobid           Current Medications (11/22/2018):  This is the current hospital active medication list Current Facility-Administered Medications  Medication Dose Route Frequency Provider Last Rate Last Dose  . 0.9 %  sodium chloride infusion   Intravenous Continuous Juanell Fairly, MD 100 mL/hr at 11/21/18 0331    . 0.9 %  sodium chloride infusion   Intravenous Continuous Juanell Fairly, MD 50 mL/hr at 11/21/18 [phone]    . 0.9 %  sodium chloride infusion  75 mL/hr Intravenous Continuous Juanell Fairly, MD 75 mL/hr at 11/21/18 1844 75 mL/hr at 11/21/18 1844  . acetaminophen (TYLENOL) tablet 325-650 mg  325-650 mg Oral Q6H PRN Juanell Fairly, MD      . amLODipine (NORVASC) tablet 10 mg  10 mg Oral Daily Juanell Fairly, MD      . aspirin EC tablet 81 mg  81 mg Oral Daily Juanell Fairly, MD  81 mg at 11/22/18 1030  . bisacodyl (DULCOLAX) suppository 10 mg  10 mg Rectal Daily PRN Juanell FairlyKrasinski, Kevin, MD      . cholecalciferol (VITAMIN D3) tablet 5,000 Units  5,000 Units Oral Daily Juanell FairlyKrasinski, Kevin, MD   5,000 Units at 11/22/18 1030  . docusate sodium (COLACE) capsule 100 mg  100 mg Oral BID Juanell FairlyKrasinski, Kevin, MD   100 mg at 11/22/18 1030  . enoxaparin (LOVENOX) injection 40 mg  40 mg Subcutaneous Q24H Juanell FairlyKrasinski, Kevin, MD   40 mg at 11/22/18 0813  .  FLUoxetine (PROZAC) capsule 40 mg  40 mg Oral Daily Juanell FairlyKrasinski, Kevin, MD   40 mg at 11/22/18 1030  . HYDROcodone-acetaminophen (NORCO) 7.5-325 MG per tablet 1-2 tablet  1-2 tablet Oral Q4H PRN Juanell FairlyKrasinski, Kevin, MD      . HYDROcodone-acetaminophen (NORCO/VICODIN) 5-325 MG per tablet 1-2 tablet  1-2 tablet Oral Q4H PRN Juanell FairlyKrasinski, Kevin, MD   1 tablet at 11/21/18 2215  . lisinopril (ZESTRIL) tablet 40 mg  40 mg Oral Daily Juanell FairlyKrasinski, Kevin, MD      . magnesium citrate solution 1 Bottle  1 Bottle Oral Once PRN Juanell FairlyKrasinski, Kevin, MD      . methocarbamol (ROBAXIN) tablet 500 mg  500 mg Oral Q6H PRN Juanell FairlyKrasinski, Kevin, MD       Or  . methocarbamol (ROBAXIN) 500 mg in dextrose 5 % 50 mL IVPB  500 mg Intravenous Q6H PRN Juanell FairlyKrasinski, Kevin, MD      . morphine 2 MG/ML injection 0.5-1 mg  0.5-1 mg Intravenous Q2H PRN Juanell FairlyKrasinski, Kevin, MD      . ondansetron [facility](ZOFRAN) tablet 4 mg  4 mg Oral Q6H PRN Juanell FairlyKrasinski, Kevin, MD       Or  . ondansetron [facility](ZOFRAN) injection 4 mg  4 mg Intravenous Q6H PRN Juanell FairlyKrasinski, Kevin, MD      . pantoprazole (PROTONIX) EC tablet 40 mg  40 mg Oral Daily Juanell FairlyKrasinski, Kevin, MD   40 mg at 11/22/18 1030  . polyethylene glycol (MIRALAX / GLYCOLAX) packet 17 g  17 g Oral Daily PRN Juanell FairlyKrasinski, Kevin, MD      . potassium chloride SA (K-DUR) CR tablet 10 mEq  10 mEq Oral Daily Juanell FairlyKrasinski, Kevin, MD   10 mEq at 11/22/18 1030  . pravastatin (PRAVACHOL) tablet 20 mg  20 mg Oral QHS Juanell FairlyKrasinski, Kevin, MD   20 mg at 11/21/18 2210  . traZODone (DESYREL) tablet 25 mg  25 mg Oral QHS PRN Juanell FairlyKrasinski, Kevin, MD         Discharge Medications: Please see discharge summary for a list of discharge medications.  Relevant Imaging Results:  Relevant Lab Results:   Additional Information [id number]  Barrie Dunkereliliah J Cianna Kasparian, RN

## 2018-11-22 NOTE — Anesthesia Postprocedure Evaluation (Signed)
Anesthesia Post Note  Patient: Cameryn Deleeuw Revard  Procedure(s) Performed: INTRAMEDULLARY (IM) NAIL INTERTROCHANTRIC (Left Hip)  Patient location during evaluation: Nursing Unit Anesthesia Type: Spinal Level of consciousness: oriented and awake and alert Pain management: pain level controlled Vital Signs Assessment: post-procedure vital signs reviewed and stable Respiratory status: spontaneous breathing and respiratory function stable Cardiovascular status: blood pressure returned to baseline and stable Postop Assessment: no headache, no backache, no apparent nausea or vomiting and patient able to bend at knees Anesthetic complications: no     Last Vitals:  Vitals:   11/22/18 0050 11/22/18 0526  BP: 123/66 130/67  Pulse: 92 95  Resp: 16 17  Temp: 36.8 C 37.5 C  SpO2: 99% 96%    Last Pain:  Vitals:   11/22/18 0526  TempSrc: Oral  PainSc:                  Starling Manns

## 2018-11-22 NOTE — Progress Notes (Signed)
Discussed with daughter [name] and also [name] who is healthcare power of attorney of the patient.  They had concerns about mom's mental status lately.  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  She has severe white matter changes indicating severe chronic microvascular infarctions.  Patient has likely dementia and lately personality/behavior changes according to daughter. -CT of the head is been ordered this admission

## 2018-11-22 NOTE — Progress Notes (Addendum)
[facility] [place] at [facility]   PATIENT NAME: Jill Hancock    MR#:  [id number]  DATE OF BIRTH:  [birth year]  SUBJECTIVE:  CHIEF COMPLAINT:   Chief Complaint  Patient presents with  . Fall  . Leg Pain   -Postop day 1 after left hip surgery.  Speech is  Slurred.  Patient had history of stroke and has baseline slurred speech - needs rehab at discharge  REVIEW OF SYSTEMS:  Review of Systems  Constitutional: Positive for malaise/fatigue. Negative for chills and fever.  HENT: Negative for congestion, ear discharge, hearing loss and nosebleeds.   Eyes: Negative for blurred vision and double vision.  Respiratory: Negative for cough, shortness of breath and wheezing.   Cardiovascular: Negative for chest pain and palpitations.  Gastrointestinal: Negative for abdominal pain, constipation, diarrhea, nausea and vomiting.  Genitourinary: Negative for dysuria.  Musculoskeletal: Positive for joint pain and myalgias.  Neurological: Negative for dizziness, seizures and headaches.  Psychiatric/Behavioral: Negative for depression.    DRUG ALLERGIES:   Allergies  Allergen Reactions  . Demerol [Meperidine] Nausea And Vomiting  . Macrobid [Nitrofurantoin Macrocrystal] Rash  . Penicillins Rash    Has patient had a PCN reaction causing immediate rash, facial/tongue/throat swelling, SOB or lightheadedness with hypotension: Unknown Has patient had a PCN reaction causing severe rash involving mucus membranes or skin necrosis: Unknown Has patient had a PCN reaction that required hospitalization: Unknown Has patient had a PCN reaction occurring within the last 10 years: No If all of the above answers are "NO", then may proceed with Cephalosporin use.     VITALS:  Blood pressure 110/64, pulse 86, temperature 98.5 F (36.9 C), temperature source Oral, resp. rate 16, height 5\' 3"  (1.6 m), weight 68 kg, SpO2 93 %.  PHYSICAL EXAMINATION:  Physical Exam   GENERAL:  78  y.o.-year-old patient lying in the bed with no acute distress.  EYES: Pupils equal, round, reactive to light and accommodation. No scleral icterus. Extraocular muscles intact.  HEENT: Head atraumatic, normocephalic. Oropharynx and nasopharynx clear.  NECK:  Supple, no jugular venous distention. No thyroid enlargement, no tenderness.  LUNGS: Normal breath sounds bilaterally, no wheezing, rales,rhonchi or crepitation. No use of accessory muscles of respiration.  Decreased bibasilar breath sounds. CARDIOVASCULAR: S1, S2 normal. No  rubs, or gallops.  2/6 systolic murmur is present ABDOMEN: Soft, nontender, nondistended. Bowel sounds present. No organomegaly or mass.  EXTREMITIES: No pedal edema, cyanosis, or clubbing.  NEUROLOGIC: Cranial nerves II through XII are intact. Muscle strength 5/5 in all extremities except left leg due to pain in the left hip. Sensation intact. Gait not checked.  PSYCHIATRIC: The patient is  Alert, speech is slurred. oriented SKIN: No obvious rash, lesion, or ulcer.    LABORATORY PANEL:   CBC Recent Labs  Lab 11/22/18 0405  WBC 12.1*  HGB 8.5*  HCT 26.2*  PLT 226   ------------------------------------------------------------------------------------------------------------------  Chemistries  Recent Labs  Lab 11/21/18 0030  11/22/18 0405  NA 140   < > 137  K 3.5   < > 3.3*  CL 106   < > 106  CO2 23   < > 23  GLUCOSE 174*   < > 139*  BUN 16   < > 14  CREATININE 0.82   < > 0.67  CALCIUM 8.9   < > 7.2*  AST 22  --   --   ALT 19  --   --   ALKPHOS 45  --   --  BILITOT 0.4  --   --    < > = values in this interval not displayed.   ------------------------------------------------------------------------------------------------------------------  Cardiac Enzymes Recent Labs  Lab 11/21/18 0030  TROPONINI <0.03   ------------------------------------------------------------------------------------------------------------------  RADIOLOGY:  Dg  Chest 1 View  Result Date: 11/21/2018 CLINICAL DATA:  78 year old female status post fall with left hip fracture. EXAM: CHEST  1 VIEW COMPARISON:  Chest CT 12/31/2017 and earlier. FINDINGS: Supine AP view at 0058 hours. Stable lung volumes and mediastinal contours. Chronic pulmonary interstitial opacity which might correspond to a degree of developing fibrosis. No pneumothorax, pleural effusion or acute pulmonary opacity. Visualized tracheal air column is within normal limits. Stable visualized osseous structures. IMPRESSION: No acute cardiopulmonary abnormality. 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 Electronically Signed   By: Odessa Fleming M.D.   On: 11/21/2018 01:27   Dg Hip Port Unilat With Pelvis 1v Left  Result Date: 11/21/2018 CLINICAL DATA:  Left hip ORIF. EXAM: DG HIP (WITH OR WITHOUT PELVIS) 1V PORT LEFT COMPARISON:  Left hip x-rays from same day. FINDINGS: Interval gamma nail fixation of the left intertrochanteric femur fracture, in anatomic alignment. Hip joint spaces are preserved. Osteopenia. Expected postsurgical changes in the soft tissues surrounding the left hip. IMPRESSION: 1. Interval left intertrochanteric femur fracture ORIF without acute postoperative complication. Electronically Signed   By: Obie Dredge M.D.   On: 11/21/2018 12:27   Dg Hip Operative Unilat W Or W/o Pelvis Left  Result Date: 11/21/2018 CLINICAL DATA:  ORIF of left femur fracture. EXAM: OPERATIVE LEFT HIP (WITH PELVIS IF PERFORMED) 5 VIEWS TECHNIQUE: Fluoroscopic spot image(s) were submitted for interpretation post-operatively. COMPARISON:  Left hip radiographs 11/21/2018 at 0056 hours FINDINGS: Five intraoperative fluoroscopic images are provided. A left femoral intramedullary nail has been placed with proximal and distal screws for fixation of the previously described intertrochanteric fracture. The main proximal and distal fragments are in grossly anatomic alignment on these limited images. There is  mild displacement of the greater trochanter fragment. IMPRESSION: Intraoperative images during ORIF of left femur fracture. Electronically Signed   By: Sebastian Ache M.D.   On: 11/21/2018 11:36   Dg Hip Unilat W Or Wo Pelvis 2-3 Views Left  Result Date: 11/21/2018 CLINICAL DATA:  78 year old female status post fall with left hip pain. EXAM: DG HIP (WITH OR WITHOUT PELVIS) 2-3V LEFT COMPARISON:  05/29/2016. FINDINGS: Comminuted left femur intertrochanteric fracture with varus impaction. Left femoral head remains normally located. Underlying osteopenia. No acute fracture of the pelvis identified. Proximal right femur appears stable and intact. Negative visible bowel gas pattern. IMPRESSION: Comminuted left femur intertrochanteric fracture with varus impaction. Electronically Signed   By: Odessa Fleming M.D.   On: 11/21/2018 01:26    EKG:   Orders placed or performed during the hospital encounter of 11/21/18  . ED EKG  . ED EKG  . EKG 12-Lead  . EKG 12-Lead    ASSESSMENT AND PLAN:   78 year old female with past medical history significant for hypertension, h/o stroke with dysphasia, depression, anxiety from home comes after   mechanical fall and left hip pain.  1.  Fall and community left femoral intertrochanteric fracture-appreciate orthopedics consult - POD # 1 today.  Pain control.   -Physical therapy consulted -will need rehab at discharge.  2.  Hypertension-continue home medications.  Patient on Norvasc, lisinopril  3.  Acute on chronic anemia-postop blood loss, hemoglobin dropped from 11-8.5.  Continue to monitor closely.  No acute indication for transfusion at this time.  4.  Leukocytosis-could be stress margination.  However check urine analysis  5.  DVT prophylaxis-Lovenox started    6.  Hypokalemia-being replaced  Social worker consulted.  Likely will need rehab at discharge   All the records are reviewed and case discussed with Care Management/Social Workerr. Management plans  discussed with the patient, family and they are in agreement.  CODE STATUS: Full code  TOTAL TIME TAKING CARE OF THIS PATIENT: 39 minutes.   POSSIBLE D/C IN 2 DAYS, DEPENDING ON CLINICAL CONDITION.   Enid Baasadhika Ryland Smoots M.D on 11/22/2018 at 12:46 PM  Between 7am to 6pm - Pager - [phone]  After 6pm go to [email or web address] - Social research officer, governmentpassword EPAS [facility]  [facility]  Office  [phone]  CC: Primary care physician; Smitty CordsKaramalegos, Alexander J, DO

## 2018-11-22 NOTE — Progress Notes (Signed)
Physical Therapy Treatment Patient Details Name: Jill Hancock MRN: [id number] DOB: January 26, 1941 Today's Date: 11/22/2018    History of Present Illness 78yo female presented to ER status post mechanical fall in home environment with immediate onset L hip pain; admitted with comminuted L femoral intertrochanteric hip fracture, s/p surgical repair (IM nailing) 5/21.  WBAT    PT Comments    Extensive work with PT/OT to promote improved sitting balance, dynamic weight shifting and postural control/alignment to midline in A/P and M/L planes.  Mod/max assist initially, improving to close sup with repetition.  Attempted to integrate alternate UE reaching, but requires hand-over-hand to facilitate despite demonstrate and education of task. Standing/OOB attempts deferred this PM due to pain; RN informed/aware and to administer meds as available.    Follow Up Recommendations  SNF     Equipment Recommendations       Recommendations for Other Services       Precautions / Restrictions Precautions Precautions: Fall Restrictions Weight Bearing Restrictions: Yes LLE Weight Bearing: Weight bearing as tolerated    Mobility  Bed Mobility Overal bed mobility: Needs Assistance Bed Mobility: Supine to Sit;Sit to Supine     Supine to sit: Max assist;+2 for physical assistance Sit to supine: Max assist;+2 for physical assistance   General bed mobility comments: poor task initiation and sequencing; generally guarded with limited dissociation of trunk/extremities  Transfers                 General transfer comment: deferred attempts this PM due to L hip pain  Ambulation/Gait                 Stairs             Wheelchair Mobility    Modified Rankin (Stroke Patients Only)       Balance Overall balance assessment: Needs assistance Sitting-balance support: Feet supported;No upper extremity supported Sitting balance-Leahy Scale: Poor Sitting balance - Comments:  mod/max assist with initial sitting due to R posterior/lateral lean; improving to close sup for static sitting towards end of session                                    Cognition Arousal/Alertness: Awake/alert [facility] tasks assessed/performed Overall Cognitive Status: No family/caregiver present to determine baseline cognitive functioning                                 General Comments: oriented to self, general situation; follows simple commands with verbal/tactile/visual cues ~75% time      Exercises Other Exercises Other Exercises: Extensive work with PT/OT to promote improved sitting balance, dynamic weight shifting and postural control/alignment to midline in A/P and M/L planes.  Mod/max assist initially, improving to close sup with repetition.  Attempted to integrate alternate UE reaching, but requires hand-over-hand to facilitate despite demonstrate and education of task. Other Exercises: sitting balance ex reaching in/outside BOS max cues, sitting balance improving as a result    General Comments        Pertinent Vitals/Pain Pain Assessment: Faces Faces Pain Scale: Hurts whole lot Pain Location: L hip, upper back Pain Descriptors / Indicators: Aching;Grimacing;Guarding Pain Intervention(s): Limited activity within patient's tolerance;Monitored during session;Repositioned;Patient requesting pain meds-RN notified    Home Living Family/patient expects to be discharged to:: Private residence Living Arrangements: Alone Available Help  at Discharge: (intermittent check in and assist from daughters)   Home Access: Level entry   Home Layout: One level Home Equipment: Walker - 2 wheels      Prior Function        Comments: Per patient report, mod indep with RW in home environment, denies additional falls; however, noted difficulty with word-finding and recall/communication of information.  Will verify with family as  appropriate.   PT Goals (current goals can now be found in the care plan section) Acute Rehab PT Goals Patient Stated Goal: patient unable to verbalize due to aphasia PT Goal Formulation: Patient unable to participate in goal setting Time For Goal Achievement: 12/06/18 Potential to Achieve Goals: Fair Progress towards PT goals: Progressing toward goals    Frequency    BID      PT Plan Current plan remains appropriate    Co-evaluation PT/OT/SLP Co-Evaluation/Treatment: Yes Reason for Co-Treatment: For patient/therapist safety;To address functional/ADL transfers;Complexity of the patient's impairments (multi-system involvement) PT goals addressed during session: Mobility/safety with mobility;Balance;Strengthening/ROM OT goals addressed during session: ADL's and self-care;Strengthening/ROM      AM-PAC PT "6 Clicks" Mobility   Outcome Measure  Help needed turning from your back to your side while in a flat bed without using bedrails?: A Lot Help needed moving from lying on your back to sitting on the side of a flat bed without using bedrails?: A Lot Help needed moving to and from a bed to a chair (including a wheelchair)?: A Lot Help needed standing up from a chair using your arms (e.g., wheelchair or bedside chair)?: A Lot Help needed to walk in hospital room?: Total Help needed climbing 3-5 steps with a railing? : Total 6 Click Score: 10    End of Session Equipment Utilized During Treatment: Gait belt Activity Tolerance: Patient limited by pain Patient left: in bed;with call bell/phone within reach;with bed alarm set Nurse Communication: Mobility status PT Visit Diagnosis: Muscle weakness (generalized) (M62.81);Difficulty in walking, not elsewhere classified (R26.2);Pain Pain - Right/Left: Left Pain - part of body: Hip     Time: 0272-5366 PT Time Calculation (min) (ACUTE ONLY): 27 min  Charges:  $Therapeutic Activity: 8-22 mins                     Chamika Cunanan H.  Manson Passey, PT, DPT, NCS 11/22/18, 4:49 PM [phone]

## 2018-11-22 NOTE — Evaluation (Signed)
Physical Therapy Evaluation Patient Details Name: Jill CockerLinda S Hancock MRN: [id number] DOB: 1941-01-20 Today's Date: 11/22/2018   History of Present Illness  presented to ER status post mechanical fall in home environment with immediate onset L hip pain; admitted with comminuted L femoral intertrochanteric hip fracture, s/p surgical repair (IM nailing) 5/21.  WBAT  Clinical Impression  Upon evaluation, patient alert and oriented to self, general location; follows simple verbal commands approx 60% time (improved with task demonstration from therapist).  Endorses significant pain to L hip, resulting in significant guarding and difficulty participating/toleratin active movement of L LE with isolated or functional movement patterns.  Suspect some degree of motor planning difficulty as well.  Currently requiring max +2 for bed mobility; mod assist for unsupported sitting (R posterior/lateral lean, lists backwards with fatigue/divided attention); mod/max assist +2 for sit/stand and static standing balance with bilat HHA.  Unable/unsafe to attempt or tolerate any stepping due to fear/inability to accept weight additional weight to L LE.  Will continue to assess/progress mobility as appropriate. Would benefit from skilled PT to address above deficits and promote optimal return to PLOF; recommend transition to STR upon discharge from acute hospitalization.     Follow Up Recommendations SNF    Equipment Recommendations       Recommendations for Other Services       Precautions / Restrictions Precautions Precautions: Fall Restrictions Weight Bearing Restrictions: Yes LLE Weight Bearing: Weight bearing as tolerated      Mobility  Bed Mobility Overal bed mobility: Needs Assistance Bed Mobility: Supine to Sit     Supine to sit: Max assist;+2 for physical assistance Sit to supine: +2 for physical assistance;Max assist   General bed mobility comments: hand-over-hand to initiate, sequence movement  transition; limited attempts at spontaneous movement and self-adjustment  Transfers Overall transfer level: Needs assistance Equipment used: 2 person hand held assist Transfers: Sit to/from Stand Sit to Stand: Mod assist;Max assist         General transfer comment: assist from bed surface, significant assist from bilat UEs to pull self up; mod/max assist +2 for anterior weight translation and overall standing balance  Ambulation/Gait             General Gait Details: unsafe/unable  Stairs            Wheelchair Mobility    Modified Rankin (Stroke Patients Only)       Balance Overall balance assessment: Needs assistance Sitting-balance support: No upper extremity supported;Feet supported Sitting balance-Leahy Scale: Poor Sitting balance - Comments: R posterior/lateral lean to offset L hip due to pain; lists posteriorally with fatigue or divided attention     Standing balance-Leahy Scale: Zero                               Pertinent Vitals/Pain Pain Assessment: Faces Faces Pain Scale: Hurts whole lot Pain Location: L hip Pain Descriptors / Indicators: Aching;Grimacing;Guarding Pain Intervention(s): Limited activity within patient's tolerance;Monitored during session;Repositioned    Home Living Family/patient expects to be discharged to:: Private residence Living Arrangements: Alone Available Help at Discharge: (intermittent check in and assist from daughters)   Home Access: Level entry     Home Layout: One level Home Equipment: Walker - 2 wheels      Prior Function           Comments: Per patient report, mod indep with RW in home environment, denies additional falls; however, noted difficulty with  word-finding and recall/communication of information.  Will verify with family as appropriate.     Hand Dominance        Extremity/Trunk Assessment   Upper Extremity Assessment Upper Extremity Assessment: Generalized weakness     Lower Extremity Assessment Lower Extremity Assessment: (L LE grossly at least 3-/5; significant guarding and motor planning difficulties. Limited by pain)       Communication   Communication: (significant expressive aphasia (patient/RN reporting baseline))  Cognition Arousal/Alertness: Awake/alert Behavior During Therapy: WFL for tasks assessed/performed Overall Cognitive Status: No family/caregiver present to determine baseline cognitive functioning                                 General Comments: oriented to self, general situation; follows simple commands approx 60% time (improved with demonstration from therapist)      General Comments      Exercises Other Exercises Other Exercises: Attempted supine LE therex, 1x3-5, act assist ROM.  Significant guarding and motor planning difficulties limit ability to actively participate with supine therex Other Exercises: Sit/stand x2 with bilat HHA, mod/max assist +2   Assessment/Plan    PT Assessment Patient needs continued PT services  PT Problem List Decreased strength;Decreased range of motion;Decreased activity tolerance;Decreased balance;Decreased mobility;Decreased knowledge of use of DME;Decreased safety awareness;Decreased knowledge of precautions;Decreased skin integrity;Pain;Decreased cognition;Decreased coordination       PT Treatment Interventions DME instruction;Gait training;Stair training;Functional mobility training;Therapeutic activities;Therapeutic exercise;Balance training;Cognitive remediation;Neuromuscular re-education;Patient/family education    PT Goals (Current goals can be found in the Care Plan section)  Acute Rehab PT Goals Patient Stated Goal: patient unable to verbalize due to aphasia PT Goal Formulation: Patient unable to participate in goal setting Time For Goal Achievement: 12/06/18 Potential to Achieve Goals: Fair    Frequency BID   Barriers to discharge        Co-evaluation                AM-PAC PT "6 Clicks" Mobility  Outcome Measure Help needed turning from your back to your side while in a flat bed without using bedrails?: A Lot Help needed moving from lying on your back to sitting on the side of a flat bed without using bedrails?: A Lot Help needed moving to and from a bed to a chair (including a wheelchair)?: A Lot Help needed standing up from a chair using your arms (e.g., wheelchair or bedside chair)?: A Lot Help needed to walk in hospital room?: Total Help needed climbing 3-5 steps with a railing? : Total 6 Click Score: 10    End of Session   Activity Tolerance: Patient tolerated treatment well;Patient limited by pain Patient left: in bed;with call bell/phone within reach;with bed alarm set Nurse Communication: Mobility status PT Visit Diagnosis: Muscle weakness (generalized) (M62.81);Difficulty in walking, not elsewhere classified (R26.2);Pain Pain - Right/Left: Left Pain - part of body: Hip    Time: 6301-6010 PT Time Calculation (min) (ACUTE ONLY): 29 min   Charges:   PT Evaluation $PT Eval Moderate Complexity: 1 Mod PT Treatments $Therapeutic Activity: 8-22 mins        Treyvone Chelf H. Manson Passey, PT, DPT, NCS 11/22/18, 10:27 AM [phone]

## 2018-11-22 NOTE — Evaluation (Signed)
Occupational Therapy Evaluation Patient Details Name: Jill Hancock MRN: [id number] DOB: April 19, 1941 Today's Date: 11/22/2018    History of Present Illness 78yo female presented to ER status post mechanical fall in home environment with immediate onset L hip pain; admitted with comminuted L femoral intertrochanteric hip fracture, s/p surgical repair (IM nailing) 5/21.  WBAT   Clinical Impression   Pt seen for OT evaluation and co-tx with PT this date, POD#1 from above surgery. PTA, pt was living in an elderly apartment by herself with check in's from her daughters. Pt reports daughters assisted with dressing, meals, and medication mgt. Pt appears to be unreliable historian, will verify with family as able. Pt agreeable to therapy, reporting a "medium amount" of L hip and upper back pain. RN notified. Per chart, last pain medication administered before 7am.  Pt currently requires Max x2 for supine to sit EOB, initial Max A for sitting balance with significant posterior lean in order to increase hip extension in LLE. With instruction during reaching there-act in/outside BOS, pt's sitting balance improved to SBA-CGA with no LOB and requiring 0-2 UE support on EOB. Pt would benefit from skilled OT services including instruction in self care skills, falls prevention strategies, home/routines modifications, DME/AE for LB bathing and dressing tasks, and compression stocking mgt strategies as appropriate. Pt would benefit from additional instruction in self care skills and techniques to help maximize return to PLOF and minimize future falls risk. Recommend STR.     Follow Up Recommendations  SNF    Equipment Recommendations  3 in 1 bedside commode    Recommendations for Other Services       Precautions / Restrictions Precautions Precautions: Fall Restrictions Weight Bearing Restrictions: Yes LLE Weight Bearing: Weight bearing as tolerated      Mobility Bed Mobility Overal bed mobility:  Needs Assistance Bed Mobility: Supine to Sit;Sit to Supine     Supine to sit: Max assist;+2 for physical assistance Sit to supine: +2 for physical assistance;Max assist   General bed mobility comments: max cues for sequencing/safety, pt fearful  Transfers                      Balance Overall balance assessment: Needs assistance Sitting-balance support: Bilateral upper extremity supported;Feet supported;No upper extremity supported Sitting balance-Leahy Scale: Poor Sitting balance - Comments: initial posterior lean, requiring BUE support, with reaching in and outside BOS, improved to SBA/CGA for sitting balance                                   ADL either performed or assessed with clinical judgement   ADL Overall ADL's : Needs assistance/impaired Eating/Feeding: Sitting;Independent   Grooming: Sitting;Min guard   Upper Body Bathing: Sitting;Minimal assistance;Moderate assistance   Lower Body Bathing: Sit to/from stand;Maximal assistance;+2 for physical assistance   Upper Body Dressing : Sitting;Minimal assistance;Moderate assistance   Lower Body Dressing: +2 for physical assistance;Maximal assistance;Sit to/from stand                       Vision Patient Visual Report: No change from baseline Vision Assessment?: No apparent visual deficits     Perception     Praxis      Pertinent Vitals/Pain Pain Assessment: Faces Faces Pain Scale: Hurts whole lot Pain Location: L hip, upper back Pain Descriptors / Indicators: Aching;Grimacing;Guarding Pain Intervention(s): Limited activity within patient's tolerance;Monitored during session;Repositioned;Patient requesting  pain meds-RN notified     Hand Dominance Right   Extremity/Trunk Assessment Upper Extremity Assessment Upper Extremity Assessment: Generalized weakness(5/5 grip strength bilat)   Lower Extremity Assessment Lower Extremity Assessment: Defer to PT evaluation(L LE grossly at  least 3-/5; significant guarding and motor planning difficulties. Limited by pain)       Communication Communication Communication: Expressive difficulties(significant expressive aphasia (patient/RN reporting baseline))   Cognition Arousal/Alertness: Awake/alert Behavior During Therapy: WFL for tasks assessed/performed Overall Cognitive Status: No family/caregiver present to determine baseline cognitive functioning                                 General Comments: oriented to self, general situation; follows simple commands with verbal/tactile/visual cues ~75% time   General Comments       Exercises Other Exercises Other Exercises: sitting balance ex reaching in/outside BOS max cues, sitting balance improving as a result   Shoulder Instructions      Home Living Family/patient expects to be discharged to:: Private residence Living Arrangements: Alone Available Help at Discharge: (intermittent check in and assist from daughters)   Home Access: Level entry     Home Layout: One level               Home Equipment: Walker - 2 wheels          Prior Functioning/Environment          Comments: Per patient report, mod indep with RW in home environment, denies additional falls; however, noted difficulty with word-finding and recall/communication of information.  Will verify with family as appropriate.        OT Problem List: Decreased strength;Decreased range of motion;Decreased knowledge of use of DME or AE;Decreased cognition;Decreased activity tolerance;Impaired balance (sitting and/or standing);Decreased safety awareness;Pain      OT Treatment/Interventions: Self-care/ADL training;Balance training;Therapeutic exercise;Neuromuscular education;Therapeutic activities;Cognitive remediation/compensation;DME and/or AE instruction;Patient/family education    OT Goals(Current goals can be found in the care plan section) Acute Rehab OT Goals Patient Stated  Goal: patient unable to verbalize due to aphasia OT Goal Formulation: With patient Time For Goal Achievement: 12/06/18 Potential to Achieve Goals: Good ADL Goals Pt Will Perform Grooming: sitting;with supervision(UE support and PRN VC) Pt Will Perform Upper Body Dressing: with min guard assist;sitting Pt Will Transfer to Toilet: with mod assist;with max assist;with +2 assist;bedside commode;stand pivot transfer  OT Frequency: Min 1X/week   Barriers to D/C:            Co-evaluation PT/OT/SLP Co-Evaluation/Treatment: Yes Reason for Co-Treatment: For patient/therapist safety;To address functional/ADL transfers PT goals addressed during session: Mobility/safety with mobility;Balance;Strengthening/ROM OT goals addressed during session: Strengthening/ROM;ADL's and self-care      AM-PAC OT "6 Clicks" Daily Activity     Outcome Measure Help from another person eating meals?: None Help from another person taking care of personal grooming?: A Little Help from another person toileting, which includes using toliet, bedpan, or urinal?: Total Help from another person bathing (including washing, rinsing, drying)?: Total Help from another person to put on and taking off regular upper body clothing?: A Lot Help from another person to put on and taking off regular lower body clothing?: Total 6 Click Score: 12   End of Session Nurse Communication: Patient requests pain meds  Activity Tolerance: Patient tolerated treatment well Patient left: in bed;with call bell/phone within reach;with bed alarm set;with SCD's reapplied  OT Visit Diagnosis: Other abnormalities of gait and mobility (R26.89);Muscle weakness (  generalized) (M62.81);Pain;Cognitive communication deficit (R41.841) Symptoms and signs involving cognitive functions: Other cerebrovascular disease Pain - Right/Left: Left Pain - part of body: Hip                Time: 3614-4315 OT Time Calculation (min): 30 min Charges:  OT General  Charges $OT Visit: 1 Visit OT Evaluation $OT Eval Moderate Complexity: 1 Mod OT Treatments $Therapeutic Activity: 8-22 mins  Richrd Prime, MPH, MS, OTR/L ascom [phone] 11/22/18, 3:08 PM

## 2018-11-22 NOTE — Progress Notes (Signed)
Subjective: 1 Day Post-Op Procedure(s) (LRB): INTRAMEDULLARY (IM) NAIL INTERTROCHANTRIC (Left)    Patient reports pain as moderate.  There is some low back pain.  She has a history of multiple compression fractures.  New x-rays were done and we await the reading but I do not believe there is anything new or serious.  Objective:   VITALS:   Vitals:   11/22/18 0741 11/22/18 1028  BP: (!) 99/57 110/64  Pulse: 86 86  Resp: 16 16  Temp: 98.5 F (36.9 C)   SpO2: 95% 93%    Neurologically intact ABD soft Neurovascular intact Sensation intact distally Intact pulses distally Dorsiflexion/Plantar flexion intact Incision: scant drainage  LABS Recent Labs    11/21/18 0030 11/21/18 0351 11/22/18 0405  HGB 11.7* 11.3* 8.5*  HCT 35.9* 34.3* 26.2*  WBC 13.0* 18.1* 12.1*  PLT 360 335 226    Recent Labs    11/21/18 0030 11/21/18 0351 11/22/18 0405  NA 140 141 137  K 3.5 3.6 3.3*  BUN 16 18 14   CREATININE 0.82 0.80 0.67  GLUCOSE 174* 155* 139*    Recent Labs    11/21/18 0600  INR 1.0     Assessment/Plan: 1 Day Post-Op Procedure(s) (LRB): INTRAMEDULLARY (IM) NAIL INTERTROCHANTRIC (Left)   Advance diet Up with therapy D/C IV fluids Discharge to SNF

## 2018-11-23 LAB — BASIC METABOLIC PANEL
Anion gap: 8 (ref 5–15)
BUN: 11 mg/dL (ref 8–23)
CO2: 23 mmol/L (ref 22–32)
Calcium: 7.7 mg/dL — ABNORMAL LOW (ref 8.9–10.3)
Chloride: 108 mmol/L (ref 98–111)
Creatinine, Ser: 0.61 mg/dL (ref 0.44–1.00)
GFR calc Af Amer: >60 mL/min (ref >60)
GFR calc non Af Amer: >60 mL/min (ref >60)
Glucose, Bld: 107 mg/dL — ABNORMAL HIGH (ref 70–99)
Potassium: 3.2 mmol/L — ABNORMAL LOW (ref 3.5–5.1)
Sodium: 139 mmol/L (ref 135–145)

## 2018-11-23 LAB — CBC
HCT: 26.1 % — ABNORMAL LOW (ref 36.0–46.0)
Hemoglobin: 8.5 g/dL — ABNORMAL LOW (ref 12.0–15.0)
MCH: 28.6 pg (ref 26.0–34.0)
MCHC: 32.6 g/dL (ref 30.0–36.0)
MCV: 87.9 fL (ref 80.0–100.0)
Platelets: 237 10*3/uL (ref 150–400)
RBC: 2.97 MIL/uL — ABNORMAL LOW (ref 3.87–5.11)
RDW: 14.5 % (ref 11.5–15.5)
WBC: 11.8 10*3/uL — ABNORMAL HIGH (ref 4.0–10.5)
nRBC: 0 % (ref 0.0–0.2)

## 2018-11-23 MED ORDER — POTASSIUM CHLORIDE CRYS ER 20 MEQ PO TBCR
20.0000 meq | EXTENDED_RELEASE_TABLET | Freq: Every day | ORAL | Status: DC
  Administered 2018-11-24: 09:00:00 20 meq via ORAL
  Filled 2018-11-23: qty 1

## 2018-11-23 NOTE — Progress Notes (Signed)
Physical Therapy Treatment Patient Details Name: Georgiann CockerLinda S Penner MRN: [id number] DOB: [birth year] Today's Date: 11/23/2018    History of Present Illness 78yo female presented to ER status post mechanical fall in home environment with immediate onset L hip pain; admitted with comminuted L femoral intertrochanteric hip fracture, s/p surgical repair (IM nailing) 5/21.  WBAT    PT Comments    Pt ready to return to bed.  Max a x 2 transfer back to bed.  Positioned for comfort.  Follow Up Recommendations  SNF     Equipment Recommendations       Recommendations for Other Services       Precautions / Restrictions Precautions Precautions: Fall Restrictions Weight Bearing Restrictions: Yes LLE Weight Bearing: Weight bearing as tolerated    Mobility  Bed Mobility Overal bed mobility: Needs Assistance Bed Mobility: Sit to Supine     Supine to sit: Max assist Sit to supine: Max assist;+2 for physical assistance      Transfers Overall transfer level: Needs assistance Equipment used: None Transfers: Sit to/from [employer]Stand;Stand Pivot Transfers Sit to Stand: Max assist;+2 physical assistance Stand pivot transfers: Max assist;+2 safety/equipment          Ambulation/Gait             General Gait Details: unsafe/unable   Stairs             Wheelchair Mobility    Modified Rankin (Stroke Patients Only)       Balance Overall balance assessment: Needs assistance Sitting-balance support: Feet supported;No upper extremity supported Sitting balance-Leahy Scale: Poor       Standing balance-Leahy Scale: Zero                              Cognition Arousal/Alertness: Awake/alert Behavior During Therapy: WFL for tasks assessed/performed Overall Cognitive Status: No family/caregiver present to determine baseline cognitive functioning                                        Exercises Other Exercises Other Exercises: Supine AAROM for  ankle pumps, heel slides, ab/add x 10    General Comments        Pertinent Vitals/Pain Pain Assessment: Faces Faces Pain Scale: Hurts even more Pain Location: L hip, upper back Pain Descriptors / Indicators: Aching;Grimacing;Guarding    Home Living                      Prior Function            PT Goals (current goals can now be found in the care plan section) Progress towards PT goals: Progressing toward goals    Frequency    BID      PT Plan Current plan remains appropriate    Co-evaluation              AM-PAC PT "6 Clicks" Mobility   Outcome Measure  Help needed turning from your back to your side while in a flat bed without using bedrails?: A Lot Help needed moving from lying on your back to sitting on the side of a flat bed without using bedrails?: A Lot Help needed moving to and from a bed to a chair (including a wheelchair)?: A Lot Help needed standing up from a chair using your arms (e.g., wheelchair or bedside chair)?: A Lot Help  needed to walk in hospital room?: Total Help needed climbing 3-5 steps with a railing? : Total 6 Click Score: 10    End of Session Equipment Utilized During Treatment: Gait belt Activity Tolerance: Patient tolerated treatment well;Patient limited by pain Patient left: in bed;with bed alarm set;with call bell/phone within reach Nurse Communication: Mobility status Pain - Right/Left: Left Pain - part of body: Hip     Time: 7829-5621 PT Time Calculation (min) (ACUTE ONLY): 12 min  Charges:  $Therapeutic Exercise: 8-22 mins $Therapeutic Activity: 8-22 mins                     Danielle Dess, PTA 11/23/18, 1:48 PM

## 2018-11-23 NOTE — Progress Notes (Signed)
Physical Therapy Treatment Patient Details Name: BREKKEN FANG MRN: [id number] DOB: [birth year] Today's Date: 11/23/2018    History of Present Illness 77yo female presented to ER status post mechanical fall in home environment with immediate onset L hip pain; admitted with comminuted L femoral intertrochanteric hip fracture, s/p surgical repair (IM nailing) 5/21.  WBAT    PT Comments    Awake and ready to get out of bed.  Participated in exercises as described below.  To edge of bed with increased time and max a x 1.  Good effort by pt.  Once sitting, she was able to sit with min guard for safety.  Stood with walker x 1 with +2 assist and unable to stand fully upright despite increased time.  Sat and stand pivot to recliner with +2 assist.  Tolerated transfer well and was pleased to be out of bed today.   Follow Up Recommendations  SNF     Equipment Recommendations       Recommendations for Other Services       Precautions / Restrictions Precautions Precautions: Fall Restrictions Weight Bearing Restrictions: Yes LLE Weight Bearing: Weight bearing as tolerated    Mobility  Bed Mobility Overal bed mobility: Needs Assistance       Supine to sit: Max assist        Transfers Overall transfer level: Needs assistance Equipment used: Rolling walker (2 wheeled);2 person hand held assist Transfers: Sit to/from [employer] Transfers Sit to Stand: Max assist            Ambulation/Gait             General Gait Details: unsafe/unable   Stairs             Wheelchair Mobility    Modified Rankin (Stroke Patients Only)       Balance Overall balance assessment: Needs assistance Sitting-balance support: Feet supported;No upper extremity supported Sitting balance-Leahy Scale: Poor       Standing balance-Leahy Scale: Zero                              Cognition Arousal/Alertness: Awake/alert Behavior During Therapy: WFL for tasks  assessed/performed Overall Cognitive Status: No family/caregiver present to determine baseline cognitive functioning                                        Exercises Other Exercises Other Exercises: Supine AAROM for ankle pumps, heel slides, ab/add x 10    General Comments        Pertinent Vitals/Pain Pain Assessment: Faces Faces Pain Scale: Hurts little more Pain Location: L hip, upper back Pain Descriptors / Indicators: Aching;Grimacing;Guarding    Home Living                      Prior Function            PT Goals (current goals can now be found in the care plan section) Progress towards PT goals: Progressing toward goals    Frequency    BID      PT Plan Current plan remains appropriate    Co-evaluation              AM-PAC PT "6 Clicks" Mobility   Outcome Measure  Help needed turning from your back to your side while in a flat bed  without using bedrails?: A Lot Help needed moving from lying on your back to sitting on the side of a flat bed without using bedrails?: A Lot   Help needed standing up from a chair using your arms (e.g., wheelchair or bedside chair)?: A Lot Help needed to walk in hospital room?: Total Help needed climbing 3-5 steps with a railing? : Total 6 Click Score: 8    End of Session Equipment Utilized During Treatment: Gait belt Activity Tolerance: Patient tolerated treatment well;Patient limited by pain Patient left: in chair;with call bell/phone within reach;with chair alarm set Nurse Communication: Mobility status Pain - Right/Left: Left Pain - part of body: Hip     Time: 9147-82951059-1123 PT Time Calculation (min) (ACUTE ONLY): 24 min  Charges:  $Therapeutic Exercise: 8-22 mins $Therapeutic Activity: 8-22 mins                     Danielle DessSarah Keta [place], PTA 11/23/18, 12:47 PM

## 2018-11-23 NOTE — Progress Notes (Signed)
Subjective: 2 Days Post-Op Procedure(s) (LRB): INTRAMEDULLARY (IM) NAIL INTERTROCHANTRIC (Left)    Patient reports pain as moderate.  Out of bed in chair.  Remains mildly confused.  Potassium increased to 20 mg daily as her potassium level remains 3.2.  Objective:   VITALS:   Vitals:   11/23/18 0554 11/23/18 0939  BP: (!) 143/76 123/75  Pulse: 95 98  Resp: 19 16  Temp: 98.6 F (37 C) 98.1 F (36.7 C)  SpO2: 94% 93%    Neurologically intact ABD soft Neurovascular intact Sensation intact distally Intact pulses distally Dorsiflexion/Plantar flexion intact Incision: no drainage  LABS Recent Labs    11/21/18 0351 11/22/18 0405 11/23/18 0444  HGB 11.3* 8.5* 8.5*  HCT 34.3* 26.2* 26.1*  WBC 18.1* 12.1* 11.8*  PLT 335 226 237    Recent Labs    11/21/18 0351 11/22/18 0405 11/23/18 0444  NA 141 137 139  K 3.6 3.3* 3.2*  BUN 18 14 11   CREATININE 0.80 0.67 0.61  GLUCOSE 155* 139* 107*    Recent Labs    11/21/18 0600  INR 1.0     Assessment/Plan: 2 Days Post-Op Procedure(s) (LRB): INTRAMEDULLARY (IM) NAIL INTERTROCHANTRIC (Left)   Advance diet Up with therapy D/C IV fluids Discharge to SNF

## 2018-11-23 NOTE — Progress Notes (Signed)
[facility] [place] [facility]   PATIENT NAME: Jill Hancock    MR#:  [id number]  DATE OF BIRTH:  October 08, 1940  SUBJECTIVE:  CHIEF COMPLAINT:   Chief Complaint  Patient presents with   Fall   Leg Pain   -Postop day 1 after left hip surgery.  Speech is  Slurred.  Patient had history of stroke and has baseline slurred speech - needs rehab at discharge. No new complaints.  REVIEW OF SYSTEMS:  Review of Systems  Constitutional: Positive for malaise/fatigue. Negative for chills and fever.  HENT: Negative for congestion, ear discharge, hearing loss and nosebleeds.   Eyes: Negative for blurred vision and double vision.  Respiratory: Negative for cough, shortness of breath and wheezing.   Cardiovascular: Negative for chest pain and palpitations.  Gastrointestinal: Negative for abdominal pain, constipation, diarrhea, nausea and vomiting.  Genitourinary: Negative for dysuria.  Musculoskeletal: Positive for joint pain and myalgias.  Neurological: Negative for dizziness, seizures and headaches.  Psychiatric/Behavioral: Negative for depression.    DRUG ALLERGIES:   Allergies  Allergen Reactions   Demerol [Meperidine] Nausea And Vomiting   Macrobid [Nitrofurantoin Macrocrystal] Rash   Penicillins Rash    Has patient had a PCN reaction causing immediate rash, facial/tongue/throat swelling, SOB or lightheadedness with hypotension: Unknown Has patient had a PCN reaction causing severe rash involving mucus membranes or skin necrosis: Unknown Has patient had a PCN reaction that required hospitalization: Unknown Has patient had a PCN reaction occurring within the last 10 years: No If all of the above answers are "NO", then may proceed with Cephalosporin use.     VITALS:  Blood pressure 123/75, pulse 98, temperature 98.1 F (36.7 C), resp. rate 16, height 5\' 3"  (1.6 m), weight 68 kg, SpO2 93 %.  PHYSICAL EXAMINATION:  Physical Exam   GENERAL:  78  y.o.-year-old patient lying in the bed with no acute distress.  EYES: Pupils equal, round, reactive to light and accommodation. No scleral icterus. Extraocular muscles intact.  HEENT: Head atraumatic, normocephalic. Oropharynx and nasopharynx clear.  NECK:  Supple, no jugular venous distention. No thyroid enlargement, no tenderness.  LUNGS: Normal breath sounds bilaterally, no wheezing, rales,rhonchi or crepitation. No use of accessory muscles of respiration.  Decreased bibasilar breath sounds. CARDIOVASCULAR: S1, S2 normal. No  rubs, or gallops.  2/6 systolic murmur is present ABDOMEN: Soft, nontender, nondistended. Bowel sounds present. No organomegaly or mass.  EXTREMITIES: No pedal edema, cyanosis, or clubbing.  NEUROLOGIC: Cranial nerves II through XII are intact. Muscle strength 5/5 in all extremities except left leg due to pain in the left hip. Sensation intact. Gait not checked.  PSYCHIATRIC: The patient is  Alert, speech is slurred. oriented SKIN: No obvious rash, lesion, or ulcer.    LABORATORY PANEL:   CBC Recent Labs  Lab 11/23/18 0444  WBC 11.8*  HGB 8.5*  HCT 26.1*  PLT 237   ------------------------------------------------------------------------------------------------------------------  Chemistries  Recent Labs  Lab 11/21/18 0030  11/23/18 0444  NA 140   < > 139  K 3.5   < > 3.2*  CL 106   < > 108  CO2 23   < > 23  GLUCOSE 174*   < > 107*  BUN 16   < > 11  CREATININE 0.82   < > 0.61  CALCIUM 8.9   < > 7.7*  AST 22  --   --   ALT 19  --   --   ALKPHOS 45  --   --  BILITOT 0.4  --   --    < > = values in this interval not displayed.   ------------------------------------------------------------------------------------------------------------------  Cardiac Enzymes Recent Labs  Lab 11/21/18 0030  TROPONINI <0.03   ------------------------------------------------------------------------------------------------------------------  RADIOLOGY:  Dg  Lumbar Spine 2-3 Views  Result Date: 11/22/2018 CLINICAL DATA:  Initial evaluation for back pain. EXAM: LUMBAR SPINE - 2-3 VIEW COMPARISON:  Prior MRI from 02/25/2018 FINDINGS: Five non rib-bearing lumbar type vertebral bodies. Mild dextroscoliosis. Trace anterolisthesis of L5 on S1. Alignment otherwise normal with preservation of the normal lumbar lordosis. Vertebral body height loss involving the T12, L3, and L5 vertebral bodies is stable from previous. No interval or acute vertebral body fracture. Visualized sacrum and pelvis intact. Osteopenia. Moderate multilevel degenerative spondylolysis, most notable at L1-2, stable from previous. No acute soft tissue abnormality. Extensive aortic atherosclerosis noted. Sequelae of prior ORIF at the left hip with overlying skin staples, partially visualized. IMPRESSION: 1. No radiographic evidence for acute abnormality within the lumbar spine. 2. Chronic compression deformities involving the T12, L3, and L5 vertebral bodies, stable. 3. Moderate multilevel degenerative spondylolysis, most notable at L1-2. 4. Extensive aortic atherosclerosis. Electronically Signed   By: Rise Mu M.D.   On: 11/22/2018 14:35   Ct Head Wo Contrast  Result Date: 11/22/2018 CLINICAL DATA:  Fall yesterday. EXAM: CT HEAD WITHOUT CONTRAST TECHNIQUE: Contiguous axial images were obtained from the base of the skull through the vertex without intravenous contrast. COMPARISON:  CT head dated December 04, 2017. FINDINGS: Brain: No evidence of acute infarction, hemorrhage, hydrocephalus, extra-axial collection or mass lesion/mass effect. Stable mild-to-moderate atrophy and severe chronic microvascular ischemic changes. Unchanged chronic small lacunar infarcts involving the bilateral cerebral white matter, bilateral basal ganglia and thalami, and right cerebellum. Vascular: Atherosclerotic vascular calcification of the carotid siphons. No hyperdense vessel. Skull: Negative for fracture or focal  lesion. Sinuses/Orbits: No acute finding. Other: None. IMPRESSION: 1.  No acute intracranial abnormality. 2. Stable atrophy, chronic microvascular ischemic changes, and chronic lacunar infarcts. Electronically Signed   By: Obie Dredge M.D.   On: 11/22/2018 16:22    EKG:   Orders placed or performed during the hospital encounter of 11/21/18   ED EKG   ED EKG   EKG 12-Lead   EKG 12-Lead    ASSESSMENT AND PLAN:   78 year old female with past medical history significant for hypertension, h/o stroke with dysphasia, depression, anxiety from home comes after   mechanical fall and left hip pain.  1.  Fall and community left femoral intertrochanteric fracture-appreciate orthopedics consult - POD # 2 today.  Pain control.   -Physical therapy consulted- need SNF placement.  2.  Hypertension-continue home medications.  Patient on Norvasc, lisinopril  3.  Acute on chronic anemia-postop blood loss, hemoglobin dropped from 11-8.5.  Continue to monitor closely.  No acute indication for transfusion at this time.  4.  Leukocytosis - could be stress margination.  However check urine analysis  5.  DVT prophylaxis-Lovenox started    6.  Hypokalemia-being replaced  Social worker consulted.   need rehab at discharge  All the records are reviewed and case discussed with Care Management/Social Workerr. Management plans discussed with the patient, family and they are in agreement.  CODE STATUS: Full code  TOTAL TIME TAKING CARE OF THIS PATIENT: 39 minutes.   POSSIBLE D/C IN 2 DAYS, DEPENDING ON CLINICAL CONDITION.   Altamese Dilling M.D on 11/23/2018 at 4:04 PM  Between 7am to 6pm - Pager - [phone]  After 6pm  go to [email or web address] - Social research officer, government  [facility]  Office  [phone]  CC: Primary care physician; Smitty Cords, DO

## 2018-11-24 LAB — CBC
HCT: 26.6 % — ABNORMAL LOW (ref 36.0–46.0)
Hemoglobin: 8.8 g/dL — ABNORMAL LOW (ref 12.0–15.0)
MCH: 28.9 pg (ref 26.0–34.0)
MCHC: 33.1 g/dL (ref 30.0–36.0)
MCV: 87.2 fL (ref 80.0–100.0)
Platelets: 263 10*3/uL (ref 150–400)
RBC: 3.05 MIL/uL — ABNORMAL LOW (ref 3.87–5.11)
RDW: 14.3 % (ref 11.5–15.5)
WBC: 10.9 10*3/uL — ABNORMAL HIGH (ref 4.0–10.5)
nRBC: 0 % (ref 0.0–0.2)

## 2018-11-24 NOTE — Progress Notes (Signed)
Physical Therapy Treatment Patient Details Name: Jill CockerLinda S Hancock MRN: [id number] DOB: [birth year] Today's Date: 11/24/2018    History of Present Illness 77yo female presented to ER status post mechanical fall in home environment with immediate onset L hip pain; admitted with comminuted L femoral intertrochanteric hip fracture, s/p surgical repair (IM nailing) 5/21.  WBAT    PT Comments    Pt requesting to use bedside commode.  Max a x 2 transfer to/from commode.  Stood with max a x 1 for cleaning.  Large soft BM noted.   Follow Up Recommendations  SNF     Equipment Recommendations       Recommendations for Other Services       Precautions / Restrictions Precautions Precautions: Fall Restrictions Weight Bearing Restrictions: Yes LLE Weight Bearing: Weight bearing as tolerated    Mobility  Bed Mobility Overal bed mobility: Needs Assistance Bed Mobility: Sit to Supine     Supine to sit: Max assist     General bed mobility comments: attempts to assist today but remains limited due to pain and fear of movement  Transfers Overall transfer level: Needs assistance Equipment used: None Transfers: Stand Pivot Transfers Sit to Stand: Max assist;+2 physical assistance Stand pivot transfers: Max assist;Total assist;+2 physical assistance       General transfer comment: transfer to/from commode for large soft BM - RN in to help  Ambulation/Gait             General Gait Details: unsafe/unable   Stairs             Wheelchair Mobility    Modified Rankin (Stroke Patients Only)       Balance Overall balance assessment: Needs assistance Sitting-balance support: Feet supported;No upper extremity supported Sitting balance-Leahy Scale: Fair Sitting balance - Comments: able to sit wihout support but requies supervision for safety   Standing balance support: Bilateral upper extremity supported Standing balance-Leahy Scale: Zero Standing balance comment:  uanble to stand fully and requries max a x 2 to remain upright.                            Cognition Arousal/Alertness: Awake/alert Behavior During Therapy: WFL for tasks assessed/performed Overall Cognitive Status: Within Functional Limits for tasks assessed                                        Exercises Other Exercises Other Exercises: Supine AAROM for ankle pumps, heel slides, ab/add x 10    General Comments        Pertinent Vitals/Pain Pain Assessment: Faces Faces Pain Scale: Hurts whole lot Pain Location: L hip with movement Pain Descriptors / Indicators: Aching;Grimacing;Guarding Pain Intervention(s): Limited activity within patient's tolerance    Home Living                      Prior Function            PT Goals (current goals can now be found in the care plan section) Progress towards PT goals: Not progressing toward goals - comment    Frequency    BID      PT Plan Current plan remains appropriate    Co-evaluation              AM-PAC PT "6 Clicks" Mobility   Outcome Measure  Help needed turning  from your back to your side while in a flat bed without using bedrails?: A Lot Help needed moving from lying on your back to sitting on the side of a flat bed without using bedrails?: A Lot Help needed moving to and from a bed to a chair (including a wheelchair)?: Total Help needed standing up from a chair using your arms (e.g., wheelchair or bedside chair)?: Total Help needed to walk in hospital room?: Total   6 Click Score: 7    End of Session Equipment Utilized During Treatment: Gait belt Activity Tolerance: Patient tolerated treatment well;Patient limited by pain Patient left: in chair;with call bell/phone within reach;with chair alarm set Nurse Communication: Mobility status Pain - Right/Left: Left Pain - part of body: Hip     Time: 6712-4580 PT Time Calculation (min) (ACUTE ONLY): 13 min  Charges:   $Therapeutic Exercise: 8-22 mins $Therapeutic Activity: 8-22 mins                    Danielle Dess, PTA 11/24/18, 12:11 PM

## 2018-11-24 NOTE — Progress Notes (Signed)
[employer] [facility] - [place] [facility]   PATIENT NAME: Jill Hancock    MR#:  [id number]  DATE OF BIRTH:  06-17-1941  SUBJECTIVE:  CHIEF COMPLAINT:   Chief Complaint  Patient presents with  . Fall  . Leg Pain   -Postop day 1 after left hip surgery.  Speech is  Slurred.  Patient had history of stroke and has baseline slurred speech - needs rehab at discharge. No new complaints.  REVIEW OF SYSTEMS:  Review of Systems  Constitutional: Positive for malaise/fatigue. Negative for chills and fever.  HENT: Negative for congestion, ear discharge, hearing loss and nosebleeds.   Eyes: Negative for blurred vision and double vision.  Respiratory: Negative for cough, shortness of breath and wheezing.   Cardiovascular: Negative for chest pain and palpitations.  Gastrointestinal: Negative for abdominal pain, constipation, diarrhea, nausea and vomiting.  Genitourinary: Negative for dysuria.  Musculoskeletal: Positive for joint pain and myalgias.  Neurological: Negative for dizziness, seizures and headaches.  Psychiatric/Behavioral: Negative for depression.    DRUG ALLERGIES:   Allergies  Allergen Reactions  . Demerol [Meperidine] Nausea And Vomiting  . Macrobid [Nitrofurantoin Macrocrystal] Rash  . Penicillins Rash    Has patient had a PCN reaction causing immediate rash, facial/tongue/throat swelling, SOB or lightheadedness with hypotension: Unknown Has patient had a PCN reaction causing severe rash involving mucus membranes or skin necrosis: Unknown Has patient had a PCN reaction that required hospitalization: Unknown Has patient had a PCN reaction occurring within the last 10 years: No If all of the above answers are "NO", then may proceed with Cephalosporin use.     VITALS:  Blood pressure 124/68, pulse 100, temperature 98.2 F (36.8 C), temperature source Oral, resp. rate 18, height 5\' 3"  (1.6 m), weight 68 kg, SpO2 96 %.  PHYSICAL EXAMINATION:  Physical  Exam   GENERAL:  78 y.o.-year-old patient lying in the bed with no acute distress.  EYES: Pupils equal, round, reactive to light and accommodation. No scleral icterus. Extraocular muscles intact.  HEENT: Head atraumatic, normocephalic. Oropharynx and nasopharynx clear.  NECK:  Supple, no jugular venous distention. No thyroid enlargement, no tenderness.  LUNGS: Normal breath sounds bilaterally, no wheezing, rales,rhonchi or crepitation. No use of accessory muscles of respiration.  Decreased bibasilar breath sounds. CARDIOVASCULAR: S1, S2 normal. No  rubs, or gallops.  2/6 systolic murmur is present ABDOMEN: Soft, nontender, nondistended. Bowel sounds present. No organomegaly or mass.  EXTREMITIES: No pedal edema, cyanosis, or clubbing.  NEUROLOGIC: Cranial nerves II through XII are intact. Muscle strength 5/5 in all extremities except left leg due to pain in the left hip. Sensation intact. Gait not checked.  PSYCHIATRIC: The patient is  Alert, speech is slurred. oriented SKIN: No obvious rash, lesion, or ulcer.    LABORATORY PANEL:   CBC Recent Labs  Lab 11/24/18 0435  WBC 10.9*  HGB 8.8*  HCT 26.6*  PLT 263   ------------------------------------------------------------------------------------------------------------------  Chemistries  Recent Labs  Lab 11/21/18 0030  11/23/18 0444  NA 140   < > 139  K 3.5   < > 3.2*  CL 106   < > 108  CO2 23   < > 23  GLUCOSE 174*   < > 107*  BUN 16   < > 11  CREATININE 0.82   < > 0.61  CALCIUM 8.9   < > 7.7*  AST 22  --   --   ALT 19  --   --   ALKPHOS 45  --   --  BILITOT 0.4  --   --    < > = values in this interval not displayed.   ------------------------------------------------------------------------------------------------------------------  Cardiac Enzymes Recent Labs  Lab 11/21/18 0030  TROPONINI <0.03   ------------------------------------------------------------------------------------------------------------------   RADIOLOGY:  Ct Head Wo Contrast  Result Date: 11/22/2018 CLINICAL DATA:  Fall yesterday. EXAM: CT HEAD WITHOUT CONTRAST TECHNIQUE: Contiguous axial images were obtained from the base of the skull through the vertex without intravenous contrast. COMPARISON:  CT head dated December 04, 2017. FINDINGS: Brain: No evidence of acute infarction, hemorrhage, hydrocephalus, extra-axial collection or mass lesion/mass effect. Stable mild-to-moderate atrophy and severe chronic microvascular ischemic changes. Unchanged chronic small lacunar infarcts involving the bilateral cerebral white matter, bilateral basal ganglia and thalami, and right cerebellum. Vascular: Atherosclerotic vascular calcification of the carotid siphons. No hyperdense vessel. Skull: Negative for fracture or focal lesion. Sinuses/Orbits: No acute finding. Other: None. IMPRESSION: 1.  No acute intracranial abnormality. 2. Stable atrophy, chronic microvascular ischemic changes, and chronic lacunar infarcts. Electronically Signed   By: Obie Dredge M.D.   On: 11/22/2018 16:22    EKG:   Orders placed or performed during the hospital encounter of 11/21/18  . ED EKG  . ED EKG  . EKG 12-Lead  . EKG 12-Lead    ASSESSMENT AND PLAN:   78 year old female with past medical history significant for hypertension, h/o stroke with dysphasia, depression, anxiety from home comes after   mechanical fall and left hip pain.  1.  Fall and community left femoral intertrochanteric fracture-appreciate orthopedics consult - POD # 3 today.  Pain control.   -Physical therapy consulted- need SNF placement.  2.  Hypertension-continue home medications.  Patient on Norvasc, lisinopril  3.  Acute on chronic anemia-postop blood loss, hemoglobin dropped from 11-8.5.  Continue to monitor closely.  No acute indication for transfusion at this time.  4.  Leukocytosis - could be stress margination.  However check urine analysis  5.  DVT prophylaxis-Lovenox started     6.  Hypokalemia-being replaced  Social worker consulted.   need rehab at discharge  All the records are reviewed and case discussed with Care Management/Social Workerr. Management plans discussed with the patient, family and they are in agreement.  CODE STATUS: Full code  TOTAL TIME TAKING CARE OF THIS PATIENT: 32 minutes.   POSSIBLE D/C IN 2 DAYS, DEPENDING ON CLINICAL CONDITION.   Altamese Dilling M.D on 11/24/2018 at 2:49 PM  Between 7am to 6pm - Pager - [phone]  After 6pm go to [email or web address] - Social research officer, government  [facility]  Office  [phone]  CC: Primary care physician; Smitty Cords, DO

## 2018-11-24 NOTE — Progress Notes (Signed)
Subjective: 3 Days Post-Op Procedure(s) (LRB): INTRAMEDULLARY (IM) NAIL INTERTROCHANTRIC (Left)    Patient reports pain as mild.  Remains pleasantly confused.  Her bed in chair.  Objective:   VITALS:   Vitals:   11/24/18 0915 11/24/18 0927  BP: 120/73 124/68  Pulse:    Resp: 18   Temp: 98.2 F (36.8 C)   SpO2: 96%     Neurologically intact ABD soft Neurovascular intact Sensation intact distally Intact pulses distally Dorsiflexion/Plantar flexion intact Incision: no drainage  LABS Recent Labs    11/22/18 0405 11/23/18 0444 11/24/18 0435  HGB 8.5* 8.5* 8.8*  HCT 26.2* 26.1* 26.6*  WBC 12.1* 11.8* 10.9*  PLT 226 237 263    Recent Labs    11/22/18 0405 11/23/18 0444  NA 137 139  K 3.3* 3.2*  BUN 14 11  CREATININE 0.67 0.61  GLUCOSE 139* 107*    No results for input(s): LABPT, INR in the last 72 hours.   Assessment/Plan: 3 Days Post-Op Procedure(s) (LRB): INTRAMEDULLARY (IM) NAIL INTERTROCHANTRIC (Left)   Advance diet Up with therapy D/C IV fluids Discharge to SNF

## 2018-11-24 NOTE — Progress Notes (Signed)
Physical Therapy Treatment Patient Details Name: Jill Hancock MRN: [id number] DOB: 01-28-1941 Today's Date: 11/24/2018    History of Present Illness 77yo female presented to ER status post mechanical fall in home environment with immediate onset L hip pain; admitted with comminuted L femoral intertrochanteric hip fracture, s/p surgical repair (IM nailing) 5/21.  WBAT    PT Comments    Pt in bed, and ready for session after receiving pain medications prior.  Participated in exercises as described below.  Pt remains hesitant with AA/PROM due to pain.  To edge of bed with max a x 1 with some improvement in her ability to assist today but remains fearful of movement.  Once sitting, she is able to sit without support but requires min guard for safety.  Stood with walker x 1 with max a x 2 and unable to stand fully upright despite assist, cues and increased time.  She returned to sitting and stand pivot dependant transfer to recliner was made.     Follow Up Recommendations  SNF     Equipment Recommendations       Recommendations for Other Services       Precautions / Restrictions Precautions Precautions: Fall Restrictions Weight Bearing Restrictions: Yes LLE Weight Bearing: Weight bearing as tolerated    Mobility  Bed Mobility Overal bed mobility: Needs Assistance Bed Mobility: Sit to Supine     Supine to sit: Max assist     General bed mobility comments: attempts to assist today but remains limited due to pain and fear of movement  Transfers Overall transfer level: Needs assistance Equipment used: Rolling walker (2 wheeled);None Transfers: Sit to/from [employer] Sit to Stand: Max assist;+2 physical assistance Stand pivot transfers: Max assist;Total assist;+2 physical assistance       General transfer comment: unable to stand fully with walker, returned to sitting for dependant stand pivot to recliner.  Ambulation/Gait             General  Gait Details: unsafe/unable   Stairs             Wheelchair Mobility    Modified Rankin (Stroke Patients Only)       Balance Overall balance assessment: Needs assistance Sitting-balance support: Feet supported;No upper extremity supported Sitting balance-Leahy Scale: Fair Sitting balance - Comments: able to sit wihout support but requies supervision for safety   Standing balance support: Bilateral upper extremity supported Standing balance-Leahy Scale: Zero Standing balance comment: uanble to stand fully and requries max a x 2 to remain upright.                            Cognition Arousal/Alertness: Awake/alert Behavior During Therapy: WFL for tasks assessed/performed Overall Cognitive Status: Within Functional Limits for tasks assessed                                        Exercises Other Exercises Other Exercises: Supine AAROM for ankle pumps, heel slides, ab/add x 10    General Comments        Pertinent Vitals/Pain Pain Assessment: Faces Faces Pain Scale: Hurts whole lot Pain Location: L hip with movement Pain Descriptors / Indicators: Aching;Grimacing;Guarding Pain Intervention(s): Limited activity within patient's tolerance;Premedicated before session    Home Living  Prior Function            PT Goals (current goals can now be found in the care plan section) Progress towards PT goals: Not progressing toward goals - comment    Frequency    BID      PT Plan Current plan remains appropriate    Co-evaluation              AM-PAC PT "6 Clicks" Mobility   Outcome Measure  Help needed turning from your back to your side while in a flat bed without using bedrails?: A Lot Help needed moving from lying on your back to sitting on the side of a flat bed without using bedrails?: A Lot Help needed moving to and from a bed to a chair (including a wheelchair)?: Total Help needed standing  up from a chair using your arms (e.g., wheelchair or bedside chair)?: Total Help needed to walk in hospital room?: Total   6 Click Score: 7    End of Session Equipment Utilized During Treatment: Gait belt Activity Tolerance: Patient tolerated treatment well;Patient limited by pain Patient left: in chair;with call bell/phone within reach;with chair alarm set Nurse Communication: Mobility status Pain - Right/Left: Left Pain - part of body: Hip     Time: [id number] PT Time Calculation (min) (ACUTE ONLY): 15 min  Charges:  $Therapeutic Exercise: 8-22 mins                    Danielle DessSarah Daleyza Gadomski, PTA 11/24/18, 11:28 AM

## 2018-11-24 NOTE — Plan of Care (Signed)
Patient reported pain, PRN medication given to good effect. Patient appeared to sleep well.   Problem: Activity: Goal: Ability to avoid complications of mobility impairment will improve Outcome: Progressing Goal: Ability to tolerate increased activity will improve Outcome: Progressing   Problem: Education: Goal: Verbalization of understanding the information provided will improve Outcome: Progressing   Problem: Coping: Goal: Level of anxiety will decrease Outcome: Progressing   Problem: Physical Regulation: Goal: Postoperative complications will be avoided or minimized Outcome: Progressing   Problem: Respiratory: Goal: Ability to maintain a clear airway will improve Outcome: Progressing   Problem: Pain Management: Goal: Pain level will decrease Outcome: Progressing   Problem: Skin Integrity: Goal: Signs of wound healing will improve Outcome: Progressing   Problem: Tissue Perfusion: Goal: Ability to maintain adequate tissue perfusion will improve Outcome: Progressing   Problem: Education: Goal: Knowledge of General Education information will improve Description Including pain rating scale, medication(s)/side effects and non-pharmacologic comfort measures Outcome: Progressing   Problem: Health Behavior/Discharge Planning: Goal: Ability to manage health-related needs will improve Outcome: Progressing   Problem: Clinical Measurements: Goal: Ability to maintain clinical measurements within normal limits will improve Outcome: Progressing Goal: Will remain free from infection Outcome: Progressing Goal: Diagnostic test results will improve Outcome: Progressing Goal: Respiratory complications will improve Outcome: Progressing Goal: Cardiovascular complication will be avoided Outcome: Progressing   Problem: Activity: Goal: Risk for activity intolerance will decrease Outcome: Progressing   Problem: Nutrition: Goal: Adequate nutrition will be maintained Outcome:  Progressing   Problem: Coping: Goal: Level of anxiety will decrease Outcome: Progressing   Problem: Elimination: Goal: Will not experience complications related to bowel motility Outcome: Progressing Goal: Will not experience complications related to urinary retention Outcome: Progressing   Problem: Pain Managment: Goal: General experience of comfort will improve Outcome: Progressing   Problem: Safety: Goal: Ability to remain free from injury will improve Outcome: Progressing   Problem: Skin Integrity: Goal: Risk for impaired skin integrity will decrease Outcome: Progressing

## 2018-11-25 LAB — BASIC METABOLIC PANEL
Anion gap: 10 (ref 5–15)
BUN: 12 mg/dL (ref 8–23)
CO2: 26 mmol/L (ref 22–32)
Calcium: 8.2 mg/dL — ABNORMAL LOW (ref 8.9–10.3)
Chloride: 103 mmol/L (ref 98–111)
Creatinine, Ser: 0.65 mg/dL (ref 0.44–1.00)
GFR calc Af Amer: >60 mL/min (ref >60)
GFR calc non Af Amer: >60 mL/min (ref >60)
Glucose, Bld: 150 mg/dL — ABNORMAL HIGH (ref 70–99)
Potassium: 3 mmol/L — ABNORMAL LOW (ref 3.5–5.1)
Sodium: 139 mmol/L (ref 135–145)

## 2018-11-25 MED ORDER — POTASSIUM CHLORIDE CRYS ER 20 MEQ PO TBCR
20.0000 meq | EXTENDED_RELEASE_TABLET | Freq: Two times a day (BID) | ORAL | Status: DC
  Administered 2018-11-25 (×2): 20 meq via ORAL
  Filled 2018-11-25 (×2): qty 1

## 2018-11-25 NOTE — Plan of Care (Signed)
  Problem: Activity: Goal: Ability to avoid complications of mobility impairment will improve Outcome: Progressing Goal: Ability to tolerate increased activity will improve Outcome: Progressing   Problem: Education: Goal: Verbalization of understanding the information provided will improve Outcome: Progressing   Problem: Coping: Goal: Level of anxiety will decrease Outcome: Progressing   Problem: Physical Regulation: Goal: Postoperative complications will be avoided or minimized Outcome: Progressing   Problem: Respiratory: Goal: Ability to maintain a clear airway will improve Outcome: Progressing   Problem: Pain Management: Goal: Pain level will decrease Outcome: Progressing   Problem: Skin Integrity: Goal: Signs of wound healing will improve Outcome: Progressing   Problem: Tissue Perfusion: Goal: Ability to maintain adequate tissue perfusion will improve Outcome: Progressing   Problem: Education: Goal: Knowledge of General Education information will improve Description: Including pain rating scale, medication(s)/side effects and non-pharmacologic comfort measures Outcome: Progressing   Problem: Health Behavior/Discharge Planning: Goal: Ability to manage health-related needs will improve Outcome: Progressing   Problem: Clinical Measurements: Goal: Ability to maintain clinical measurements within normal limits will improve Outcome: Progressing Goal: Will remain free from infection Outcome: Progressing Goal: Diagnostic test results will improve Outcome: Progressing Goal: Respiratory complications will improve Outcome: Progressing Goal: Cardiovascular complication will be avoided Outcome: Progressing   Problem: Activity: Goal: Risk for activity intolerance will decrease Outcome: Progressing   Problem: Nutrition: Goal: Adequate nutrition will be maintained Outcome: Progressing   Problem: Coping: Goal: Level of anxiety will decrease Outcome: Progressing    Problem: Elimination: Goal: Will not experience complications related to bowel motility Outcome: Progressing Goal: Will not experience complications related to urinary retention Outcome: Progressing   Problem: Pain Managment: Goal: General experience of comfort will improve Outcome: Progressing   Problem: Safety: Goal: Ability to remain free from injury will improve Outcome: Progressing   Problem: Skin Integrity: Goal: Risk for impaired skin integrity will decrease Outcome: Progressing   

## 2018-11-25 NOTE — TOC Progression Note (Signed)
Transition of Care [facility]) - Progression Note    Patient Details  Name: Jill Hancock MRN: [id number] Date of Birth: 01-29-1941  [facility]) CM/SW Contact  Barrie Dunker, RN Phone Number: 11/25/2018, 8:50 AM  Clinical Narrative:    Theodora Blow at [facility] to get status of insurance authorization, she stated that they have not received it yet and that she has reached out to them this monring to try and get it. She will contact me once she gets it   Expected Discharge Plan: Skilled Nursing Facility Barriers to Discharge: Continued Medical Work up  Expected Discharge Plan and Services Expected Discharge Plan: Skilled Nursing Facility   Discharge Planning Services: CM Consult   Living arrangements for the past 2 months: Apartment Expected Discharge Date: 11/25/18                                     Social Determinants of Health (SDOH) Interventions    Readmission Risk Interventions No flowsheet data found.

## 2018-11-25 NOTE — Care Management Important Message (Signed)
Important Message  Patient Details  Name: Jill Hancock MRN: [id number] Date of Birth: 05/23/1941   Medicare Important Message Given:  Yes    Johnell Comings 11/25/2018, 10:27 AM

## 2018-11-25 NOTE — Progress Notes (Signed)
Subjective: 4 Days Post-Op Procedure(s) (LRB): INTRAMEDULLARY (IM) NAIL INTERTROCHANTRIC (Left)    Patient reports pain as mild.  Remains mildly confused.  No pain with movement of hip.  Dressings remain dry.  Objective:   VITALS:   Vitals:   11/25/18 0340 11/25/18 0749  BP: 117/67 127/76  Pulse: 92 93  Resp: 19 14  Temp: 98.2 F (36.8 C) 98.1 F (36.7 C)  SpO2: 92% 95%    Neurologically intact ABD soft Neurovascular intact Sensation intact distally Intact pulses distally Dorsiflexion/Plantar flexion intact Incision: no drainage  LABS Recent Labs    11/23/18 0444 11/24/18 0435  HGB 8.5* 8.8*  HCT 26.1* 26.6*  WBC 11.8* 10.9*  PLT 237 263    Recent Labs    11/23/18 0444 11/25/18 0457  NA 139 139  K 3.2* 3.0*  BUN 11 12  CREATININE 0.61 0.65  GLUCOSE 107* 150*    No results for input(s): LABPT, INR in the last 72 hours.   Assessment/Plan: 4 Days Post-Op Procedure(s) (LRB): INTRAMEDULLARY (IM) NAIL INTERTROCHANTRIC (Left)   Up with therapy Discharge to SNF   Follow-up with Dr. Martha Clan in 2 weeks.

## 2018-11-25 NOTE — Progress Notes (Signed)
Physical Therapy Treatment Patient Details Name: HAILLIE PREMO MRN: [id number] DOB: 11-Oct-1940 Today's Date: 11/25/2018    History of Present Illness 78yo female presented to ER status post mechanical fall in home environment with immediate onset L hip pain; admitted with comminuted L femoral intertrochanteric hip fracture, s/p surgical repair (IM nailing) 5/21.  WBAT    PT Comments    Patient in bed upon PT arrival. PT called CNA due to noticing brown spots on floor and laptop area, CNA reports patient has thrown coffee twice today. Patient's session limited due to increasing agitation with LE movement and interventions. Patient able to calm down intermittently however upon attempt to EOB became agitated and attempt terminated. Patient rolled to R side with Mod I however and additional time/cueing for hand placement. Current POC remains appropriate at this time.     Follow Up Recommendations  SNF     Equipment Recommendations       Recommendations for Other Services       Precautions / Restrictions Precautions Precautions: Fall Restrictions Weight Bearing Restrictions: Yes LLE Weight Bearing: Weight bearing as tolerated    Mobility  Bed Mobility               General bed mobility comments: performed rolling L and R, unable to sit EOB due to agitation (threw coffee twice today)  Transfers                    Ambulation/Gait                 Stairs             Wheelchair Mobility    Modified Rankin (Stroke Patients Only)       Balance                                            Cognition Arousal/Alertness: Awake/alert Behavior During Therapy: WFL for tasks assessed/performed Overall Cognitive Status: Within Functional Limits for tasks assessed                                 General Comments: per CNA has been agitated, throwing coffee      Exercises Other Exercises Other Exercises: Supine AAROM  for ankle pumps, heel slides, quad sets for 3 second holds ab/add x 10 Other Exercises: rolling to R for washing of back, able to hold self.     General Comments        Pertinent Vitals/Pain Pain Assessment: Faces Faces Pain Scale: Hurts whole lot Pain Location: L hip with movement Pain Descriptors / Indicators: Aching;Grimacing;Guarding Pain Intervention(s): Limited activity within patient's tolerance;Repositioned    Home Living                      Prior Function            PT Goals (current goals can now be found in the care plan section) Progress towards PT goals: Not progressing toward goals - comment(limited due to fear of pain and agitation )    Frequency    BID      PT Plan Current plan remains appropriate    Co-evaluation              AM-PAC PT "6 Clicks" Mobility   Outcome Measure  Help needed turning from your back to your side while in a flat bed without using bedrails?: A Lot Help needed moving from lying on your back to sitting on the side of a flat bed without using bedrails?: A Lot Help needed moving to and from a bed to a chair (including a wheelchair)?: Total Help needed standing up from a chair using your arms (e.g., wheelchair or bedside chair)?: Total Help needed to walk in hospital room?: Total   6 Click Score: 7    End of Session   Activity Tolerance: Patient limited by pain;Treatment limited secondary to agitation(per CNA patient has been throwing coffee today, called due to noticing coffee on floor/laptop. ) Patient left: with call bell/phone within reach;in bed;with bed alarm set Nurse Communication: Mobility status PT Visit Diagnosis: Muscle weakness (generalized) (M62.81);Difficulty in walking, not elsewhere classified (R26.2);Pain Pain - Right/Left: Left Pain - part of body: Hip     Time: [id number] PT Time Calculation (min) (ACUTE ONLY): 14 min  Charges:  $Therapeutic Exercise: 8-22 mins                      Precious BardMarina Makana Rostad, PT, DPT     Precious BardMarina Veretta Sabourin 11/25/2018, 5:06 PM

## 2018-11-25 NOTE — Progress Notes (Signed)
[facility]   PATIENT NAME: Jill Hancock    MR#:  [id number]  DATE OF BIRTH:  08-13-1940  SUBJECTIVE:  CHIEF COMPLAINT:   Chief Complaint  Patient presents with  . Fall  . Leg Pain   -Postop day 1 after left hip surgery.  Speech is  Slurred.  Patient had history of stroke and has baseline slurred speech - needs rehab at discharge. No new complaints.  REVIEW OF SYSTEMS:  Review of Systems  Constitutional: Positive for malaise/fatigue. Negative for chills and fever.  HENT: Negative for congestion, ear discharge, hearing loss and nosebleeds.   Eyes: Negative for blurred vision and double vision.  Respiratory: Negative for cough, shortness of breath and wheezing.   Cardiovascular: Negative for chest pain and palpitations.  Gastrointestinal: Negative for abdominal pain, constipation, diarrhea, nausea and vomiting.  Genitourinary: Negative for dysuria.  Musculoskeletal: Positive for joint pain and myalgias.  Neurological: Negative for dizziness, seizures and headaches.  Psychiatric/Behavioral: Negative for depression.    DRUG ALLERGIES:   Allergies  Allergen Reactions  . Demerol [Meperidine] Nausea And Vomiting  . Macrobid [Nitrofurantoin Macrocrystal] Rash  . Penicillins Rash    Has patient had a PCN reaction causing immediate rash, facial/tongue/throat swelling, SOB or lightheadedness with hypotension: Unknown Has patient had a PCN reaction causing severe rash involving mucus membranes or skin necrosis: Unknown Has patient had a PCN reaction that required hospitalization: Unknown Has patient had a PCN reaction occurring within the last 10 years: No If all of the above answers are "NO", then may proceed with Cephalosporin use.     VITALS:  Blood pressure 127/76, pulse 93, temperature 98.1 F (36.7 C), resp. rate 14, height 5\' 3"  (1.6 m), weight 68 kg, SpO2 95 %.  PHYSICAL EXAMINATION:  Physical Exam   GENERAL:  78  y.o.-year-old patient lying in the bed with no acute distress.  EYES: Pupils equal, round, reactive to light and accommodation. No scleral icterus. Extraocular muscles intact.  HEENT: Head atraumatic, normocephalic. Oropharynx and nasopharynx clear.  NECK:  Supple, no jugular venous distention. No thyroid enlargement, no tenderness.  LUNGS: Normal breath sounds bilaterally, no wheezing, rales,rhonchi or crepitation. No use of accessory muscles of respiration.  Decreased bibasilar breath sounds. CARDIOVASCULAR: S1, S2 normal. No  rubs, or gallops.  2/6 systolic murmur is present ABDOMEN: Soft, nontender, nondistended. Bowel sounds present. No organomegaly or mass.  EXTREMITIES: No pedal edema, cyanosis, or clubbing.  NEUROLOGIC: Cranial nerves II through XII are intact. Muscle strength 5/5 in all extremities except left leg due to pain in the left hip. Sensation intact. Gait not checked.  PSYCHIATRIC: The patient is  Alert, speech is slurred. oriented SKIN: No obvious rash, lesion, or ulcer.    LABORATORY PANEL:   CBC Recent Labs  Lab 11/24/18 0435  WBC 10.9*  HGB 8.8*  HCT 26.6*  PLT 263   ------------------------------------------------------------------------------------------------------------------  Chemistries  Recent Labs  Lab 11/21/18 0030  11/25/18 0457  NA 140   < > 139  K 3.5   < > 3.0*  CL 106   < > 103  CO2 23   < > 26  GLUCOSE 174*   < > 150*  BUN 16   < > 12  CREATININE 0.82   < > 0.65  CALCIUM 8.9   < > 8.2*  AST 22  --   --   ALT 19  --   --   ALKPHOS 45  --   --  BILITOT 0.4  --   --    < > = values in this interval not displayed.   ------------------------------------------------------------------------------------------------------------------  Cardiac Enzymes Recent Labs  Lab 11/21/18 0030  TROPONINI <0.03   ------------------------------------------------------------------------------------------------------------------  RADIOLOGY:  No  results found.  EKG:   Orders placed or performed during the hospital encounter of 11/21/18  . ED EKG  . ED EKG  . EKG 12-Lead  . EKG 12-Lead    ASSESSMENT AND PLAN:   [age] year old female with past medical history significant for hypertension, h/o stroke with dysphasia, depression, anxiety from home comes after   mechanical fall and left hip pain.  1.  Fall and community left femoral intertrochanteric fracture-appreciate orthopedics consult - POD # 4 today.  Pain control.   -Physical therapy consulted- need SNF placement.  Waiting for insurance approval.  2.  Hypertension-continue home medications.  Patient on Norvasc, lisinopril  3.  Acute on chronic anemia-postop blood loss, hemoglobin dropped from 11-8.5.  Continue to monitor closely.  No acute indication for transfusion at this time.  4.  Leukocytosis - could be stress margination.  However check urine analysis  5.  DVT prophylaxis-Lovenox started    6.  Hypokalemia-being replaced  Social worker consulted.   need rehab at discharge  All the records are reviewed and case discussed with Care Management/Social Workerr. Management plans discussed with the patient, family and they are in agreement.  CODE STATUS: Full code  TOTAL TIME TAKING CARE OF THIS PATIENT: 32 minutes.   POSSIBLE D/C IN 2 DAYS, DEPENDING ON CLINICAL CONDITION.   Altamese DillingVaibhavkumar Kameka Whan M.D on 11/25/2018 at 4:40 PM  Between 7am to 6pm - Pager - [phone]  After 6pm go to [email or web address] - Social research officer, governmentpassword EPAS [facility]  [facility]  Office  [phone]  CC: Primary care physician; Smitty CordsKaramalegos, Alexander J, DO

## 2018-11-26 DIAGNOSIS — M6281 Muscle weakness (generalized): Secondary | ICD-10-CM | POA: Diagnosis not present

## 2018-11-26 DIAGNOSIS — M2550 Pain in unspecified joint: Secondary | ICD-10-CM | POA: Diagnosis not present

## 2018-11-26 DIAGNOSIS — E785 Hyperlipidemia, unspecified: Secondary | ICD-10-CM | POA: Diagnosis not present

## 2018-11-26 DIAGNOSIS — Z4789 Encounter for other orthopedic aftercare: Secondary | ICD-10-CM | POA: Diagnosis not present

## 2018-11-26 DIAGNOSIS — K219 Gastro-esophageal reflux disease without esophagitis: Secondary | ICD-10-CM | POA: Diagnosis not present

## 2018-11-26 DIAGNOSIS — M1711 Unilateral primary osteoarthritis, right knee: Secondary | ICD-10-CM | POA: Diagnosis not present

## 2018-11-26 DIAGNOSIS — F329 Major depressive disorder, single episode, unspecified: Secondary | ICD-10-CM | POA: Diagnosis not present

## 2018-11-26 DIAGNOSIS — R0902 Hypoxemia: Secondary | ICD-10-CM | POA: Diagnosis not present

## 2018-11-26 DIAGNOSIS — Z8673 Personal history of transient ischemic attack (TIA), and cerebral infarction without residual deficits: Secondary | ICD-10-CM | POA: Diagnosis not present

## 2018-11-26 DIAGNOSIS — R2681 Unsteadiness on feet: Secondary | ICD-10-CM | POA: Diagnosis not present

## 2018-11-26 DIAGNOSIS — S72142D Displaced intertrochanteric fracture of left femur, subsequent encounter for closed fracture with routine healing: Secondary | ICD-10-CM | POA: Diagnosis not present

## 2018-11-26 DIAGNOSIS — S72002D Fracture of unspecified part of neck of left femur, subsequent encounter for closed fracture with routine healing: Secondary | ICD-10-CM | POA: Diagnosis not present

## 2018-11-26 DIAGNOSIS — E876 Hypokalemia: Secondary | ICD-10-CM | POA: Diagnosis not present

## 2018-11-26 DIAGNOSIS — R262 Difficulty in walking, not elsewhere classified: Secondary | ICD-10-CM | POA: Diagnosis not present

## 2018-11-26 DIAGNOSIS — I10 Essential (primary) hypertension: Secondary | ICD-10-CM | POA: Diagnosis not present

## 2018-11-26 DIAGNOSIS — S72002A Fracture of unspecified part of neck of left femur, initial encounter for closed fracture: Secondary | ICD-10-CM | POA: Diagnosis not present

## 2018-11-26 DIAGNOSIS — W19XXXA Unspecified fall, initial encounter: Secondary | ICD-10-CM | POA: Diagnosis not present

## 2018-11-26 DIAGNOSIS — F419 Anxiety disorder, unspecified: Secondary | ICD-10-CM | POA: Diagnosis not present

## 2018-11-26 DIAGNOSIS — Z7401 Bed confinement status: Secondary | ICD-10-CM | POA: Diagnosis not present

## 2018-11-26 LAB — BASIC METABOLIC PANEL
Anion gap: 10 (ref 5–15)
BUN: 10 mg/dL (ref 8–23)
CO2: 26 mmol/L (ref 22–32)
Calcium: 8.4 mg/dL — ABNORMAL LOW (ref 8.9–10.3)
Chloride: 102 mmol/L (ref 98–111)
Creatinine, Ser: 0.61 mg/dL (ref 0.44–1.00)
GFR calc Af Amer: >60 mL/min (ref >60)
GFR calc non Af Amer: >60 mL/min (ref >60)
Glucose, Bld: 119 mg/dL — ABNORMAL HIGH (ref 70–99)
Potassium: 3.1 mmol/L — ABNORMAL LOW (ref 3.5–5.1)
Sodium: 138 mmol/L (ref 135–145)

## 2018-11-26 MED ORDER — POTASSIUM CHLORIDE CRYS ER 20 MEQ PO TBCR
40.0000 meq | EXTENDED_RELEASE_TABLET | Freq: Two times a day (BID) | ORAL | Status: DC
  Administered 2018-11-26: 09:00:00 40 meq via ORAL
  Filled 2018-11-26: qty 2

## 2018-11-26 MED ORDER — ENOXAPARIN SODIUM 40 MG/0.4ML ~~LOC~~ SOLN: 40.0000 mg | SUBCUTANEOUS | 0 refills | Status: DC

## 2018-11-26 MED ORDER — POLYETHYLENE GLYCOL 3350 17 G PO PACK: 17.0000 g | PACK | Freq: Every day | ORAL | 0 refills | Status: DC | PRN

## 2018-11-26 MED ORDER — HYDROCODONE-ACETAMINOPHEN 5-325 MG PO TABS: 1.0000 | ORAL_TABLET | Freq: Four times a day (QID) | ORAL | 0 refills | Status: DC | PRN

## 2018-11-26 MED ORDER — DOCUSATE SODIUM 100 MG PO CAPS: 100.0000 mg | ORAL_CAPSULE | Freq: Two times a day (BID) | ORAL | 0 refills | Status: DC

## 2018-11-26 NOTE — Progress Notes (Signed)
Physical Therapy Treatment Patient Details Name: Jill Hancock MRN: [id number] DOB: 13-Feb-1941 Today's Date: 11/26/2018    History of Present Illness 77yo female presented to ER status post mechanical fall in home environment with immediate onset L hip pain; admitted with comminuted L femoral intertrochanteric hip fracture, s/p surgical repair (IM nailing) 5/21.  WBAT    PT Comments    Improved orientation and active participation with session this date.  Actively participating with act assist movement to L LE (approx 50% normal ROM) and completing bed/BSC transfer with bilat HHA, max assist +2.  Very heavy posterior trunk lean/weight shift; dep for midline orientation, weight shift and limb advancement. Very hesitant/fearful of falling.    Follow Up Recommendations  SNF     Equipment Recommendations       Recommendations for Other Services       Precautions / Restrictions Precautions Precautions: Fall Restrictions Weight Bearing Restrictions: Yes LLE Weight Bearing: Weight bearing as tolerated    Mobility  Bed Mobility Overal bed mobility: Needs Assistance Bed Mobility: Supine to Sit;Sit to Supine     Supine to sit: Max assist;+2 for safety/equipment Sit to supine: Max assist;+2 for safety/equipment   General bed mobility comments: improved ability to actively assist with movement transition  Transfers Overall transfer level: Needs assistance Equipment used: Rolling walker (2 wheeled) Transfers: Sit to/from Stand Sit to Stand: Mod assist;Max assist;+2 physical assistance Stand pivot transfers: Mod assist;Max assist;+2 physical assistance       General transfer comment: heavy posterior trunk lean/weight shift; difficulty with R LE placement under BOS (heavy ankle PF, retropulsion).  Poor balance, unsafe to attempt without +2 assist  Ambulation/Gait             General Gait Details: unsafe/unable   Stairs             Wheelchair Mobility     Modified Rankin (Stroke Patients Only)       Balance Overall balance assessment: Needs assistance Sitting-balance support: No upper extremity supported;Feet supported Sitting balance-Leahy Scale: Fair Sitting balance - Comments: mod assist to acheive, but maintains with close sup once positioned in midline     Standing balance-Leahy Scale: Zero Standing balance comment: max assist +2 for standing, midline orientatino in A/P, M/L planes                            Cognition Arousal/Alertness: Awake/alert Behavior During Therapy: WFL for tasks assessed/performed Overall Cognitive Status: Within Functional Limits for tasks assessed                                 General Comments: pleasant and cooperative, following commands this date      Exercises Other Exercises Other Exercises: Toilet transfer, SPT with bilat HHA, max assist +2 for balance, weight shift and overall limb advancement; unable to empty bladder.  Returned to bed per RN request for pending cath/UA  Supine L LE therex, 1x10, act assist ROM: ankle pumps, heel slides, hip abduct/adduct.  Improved dissociation and active assist of L LE movement patterns this date, tolerating movement through approximately 50% normal ROM.    General Comments        Pertinent Vitals/Pain Pain Assessment: Faces Faces Pain Scale: Hurts little more Pain Location: L hip with movement Pain Descriptors / Indicators: Aching;Grimacing;Guarding Pain Intervention(s): Limited activity within patient's tolerance;Monitored during session;Repositioned;Premedicated before session  Home Living                      Prior Function            PT Goals (current goals can now be found in the care plan section) Acute Rehab PT Goals Patient Stated Goal: patient unable to verbalize due to aphasia PT Goal Formulation: Patient unable to participate in goal setting Time For Goal Achievement: 12/06/18 Potential to  Achieve Goals: Fair Progress towards PT goals: Progressing toward goals    Frequency    BID      PT Plan Current plan remains appropriate    Co-evaluation              AM-PAC PT "6 Clicks" Mobility   Outcome Measure  Help needed turning from your back to your side while in a flat bed without using bedrails?: A Lot Help needed moving from lying on your back to sitting on the side of a flat bed without using bedrails?: A Lot Help needed moving to and from a bed to a chair (including a wheelchair)?: A Lot Help needed standing up from a chair using your arms (e.g., wheelchair or bedside chair)?: A Lot Help needed to walk in hospital room?: A Lot Help needed climbing 3-5 steps with a railing? : Total 6 Click Score: 11    End of Session Equipment Utilized During Treatment: Gait belt Activity Tolerance: Patient tolerated treatment well Patient left: in bed;with call bell/phone within reach;with bed alarm set Nurse Communication: Mobility status PT Visit Diagnosis: Muscle weakness (generalized) (M62.81);Difficulty in walking, not elsewhere classified (R26.2);Pain Pain - Right/Left: Left Pain - part of body: Hip     Time: [id number] PT Time Calculation (min) (ACUTE ONLY): 26 min  Charges:  $Therapeutic Exercise: 8-22 mins $Therapeutic Activity: 8-22 mins                     Sandra Tellefsen H. Manson PasseyBrown, PT, DPT, NCS 11/26/18, 9:52 AM [phone]

## 2018-11-26 NOTE — Progress Notes (Signed)
[place] called family for pick up of jewelry left. [name] patient's daughter asked if Shanda Bumps, RN would bring to her.

## 2018-11-26 NOTE — TOC Transition Note (Signed)
Transition of Care [facility]) - CM/SW Discharge Note   Patient Details  Name: Jill Hancock MRN: [id number] Date of Birth: 05-16-1941  [facility] CM/SW Contact:  Ruthe Mannan, LCSWA Phone Number: 11/26/2018, 3:31 PM   Clinical Narrative:   Patient is medically ready for discharge today to [facility]. CSW notified patient's daughter [name] of discharge today. CSW also notified [name] at [facility] of discharge today. [name] states that they have received insurance approval for patient. Patient will be transported by EMS. RN to call report and call for transport.     Final next level of care: Skilled Nursing Facility Barriers to Discharge: No Barriers Identified   Patient Goals and CMS Choice Patient states their goals for this hospitalization and ongoing recovery are:: get better CMS Medicare.gov Compare Post Acute Care list provided to:: Patient Represenative (must comment)(Daughter- [name]) Choice offered to / list presented to : Adult Children  Discharge Placement   Existing PASRR number confirmed : 11/22/18          Patient chooses bed at: [facility] Patient to be transferred to facility by: EMS Name of family member notified: Daughter- [name]  Patient and family notified of of transfer: 11/26/18  Discharge Plan and Services   Discharge Planning Services: CM Consult                                 Social Determinants of Health (SDOH) Interventions     Readmission Risk Interventions No flowsheet data found.

## 2018-11-26 NOTE — Discharge Summary (Signed)
[facility] [place] at [facility]   PATIENT NAME: Jill Hancock    MR#:  [id number]  DATE OF BIRTH:  Dec 17, 1940  DATE OF ADMISSION:  11/21/2018 ADMITTING PHYSICIAN: Hannah Beat, MD  DATE OF DISCHARGE: 11/26/2018   PRIMARY CARE PHYSICIAN: Smitty Cords, DO    ADMISSION DIAGNOSIS:  Fall [W19.XXXA] Closed comminuted intertrochanteric fracture of left femur, initial encounter (HCC) [S72.142A]  DISCHARGE DIAGNOSIS:  Active Problems:   Closed left hip fracture (HCC)   SECONDARY DIAGNOSIS:   Past Medical History:  Diagnosis Date  . Anxiety   . Depression   . Hyperglycemia   . Hypertension   . Hypokalemia   . Osteoporosis   . Stroke [facility]) 02/2012   MRI revealed at least 3 subcentimeter acute infarctions with one area of subacute infarction in widely disparate vascular territories including territory of left cerebellum and around right caudate nucleus along with widespread lacunar infarcts, chronic microvascular ischemic change, and numerous microbleeds suggesting long standing hypertensive cerebrovascular disease.  MRA confirmed intracranial  . Unsteady gait   . Vertigo     HOSPITAL COURSE:   78 year old female with past medical history significant for hypertension, h/o stroke with dysphasia, depression, anxiety from home comes after   mechanical fall and left hip pain.  1.  Fall and community left femoral intertrochanteric fracture-appreciate orthopedics consult - POD # 5 today.  Pain control.   -Physical therapy consulted- need SNF placement.  Waiting for insurance approval. Received approval, send today  2.  Hypertension-continue home medications.  Patient on Norvasc, lisinopril  3.  Acute on chronic anemia-postop blood loss, hemoglobin dropped from 11-8.5.  Continue to monitor closely.  No acute indication for transfusion at this time.  4.  Leukocytosis - could be stress margination.  However check urine analysis  5.  DVT  prophylaxis-Lovenox started    6.  Hypokalemia-being replaced   DISCHARGE CONDITIONS:   Stable.  CONSULTS OBTAINED:    DRUG ALLERGIES:   Allergies  Allergen Reactions  . Demerol [Meperidine] Nausea And Vomiting  . Macrobid [Nitrofurantoin Macrocrystal] Rash  . Penicillins Rash    Has patient had a PCN reaction causing immediate rash, facial/tongue/throat swelling, SOB or lightheadedness with hypotension: Unknown Has patient had a PCN reaction causing severe rash involving mucus membranes or skin necrosis: Unknown Has patient had a PCN reaction that required hospitalization: Unknown Has patient had a PCN reaction occurring within the last 10 years: No If all of the above answers are "NO", then may proceed with Cephalosporin use.     DISCHARGE MEDICATIONS:   Allergies as of 11/26/2018      Reactions   Demerol [meperidine] Nausea And Vomiting   Macrobid [nitrofurantoin Macrocrystal] Rash   Penicillins Rash   Has patient had a PCN reaction causing immediate rash, facial/tongue/throat swelling, SOB or lightheadedness with hypotension: Unknown Has patient had a PCN reaction causing severe rash involving mucus membranes or skin necrosis: Unknown Has patient had a PCN reaction that required hospitalization: Unknown Has patient had a PCN reaction occurring within the last 10 years: No If all of the above answers are "NO", then may proceed with Cephalosporin use.      Medication List    STOP taking these medications   meloxicam 15 MG tablet Commonly known as:  MOBIC     TAKE these medications   acetaminophen 500 MG tablet Commonly known as:  TYLENOL Take 500 mg by mouth every 6 (six) hours as needed for mild pain  or headache.   alendronate 70 MG tablet Commonly known as:  FOSAMAX TAKE 1 TABLET EVERY 7 DAYS.  SEE PACKAGE FOR ADDITIONAL INSTRUCTIONS   amLODipine 10 MG tablet Commonly known as:  NORVASC TAKE 1 TABLET BY MOUTH EVERY DAY   aspirin 81 MG tablet Take 1  tablet (81 mg total) by mouth daily.   AZO CRANBERRY URINARY TRACT PO Take 2 tablets by mouth daily.   docusate sodium 100 MG capsule Commonly known as:  COLACE Take 1 capsule (100 mg total) by mouth 2 (two) times daily.   enoxaparin 40 MG/0.4ML injection Commonly known as:  LOVENOX Inject 0.4 mLs (40 mg total) into the skin daily. Start taking on:  Nov 27, 2018   esomeprazole 20 MG capsule Commonly known as:  NEXIUM Take 1 capsule (20 mg total) by mouth daily before breakfast.   FLUoxetine 40 MG capsule Commonly known as:  PROZAC Take 1 capsule (40 mg total) by mouth daily.   HYDROcodone-acetaminophen 5-325 MG tablet Commonly known as:  NORCO/VICODIN Take 1-2 tablets by mouth every 6 (six) hours as needed for moderate pain or severe pain (pain score 4-6).   ipratropium 0.06 % nasal spray Commonly known as:  ATROVENT Place 2 sprays into both nostrils 4 (four) times daily. For up to 5-7 days then stop.   lisinopril 40 MG tablet Commonly known as:  ZESTRIL Take 1 tablet (40 mg total) by mouth daily.   polyethylene glycol 17 g packet Commonly known as:  MIRALAX / GLYCOLAX Take 17 g by mouth daily as needed for mild constipation.   potassium chloride 10 MEQ tablet Commonly known as:  K-DUR Take 1 tablet (10 mEq total) by mouth daily.   pravastatin 20 MG tablet Commonly known as:  PRAVACHOL Take 1 tablet (20 mg total) by mouth at bedtime.   Vitamin D3 125 MCG (5000 UT) Caps Take 1 capsule (5,000 Units total) by mouth daily. For 12 weeks, then start Vitamin D3 2,000 units daily (OTC)        DISCHARGE INSTRUCTIONS:   Follow with PMD in 1-2 weeks, follow with ortho in 1-2 weeks/  If you experience worsening of your admission symptoms, develop shortness of breath, life threatening emergency, suicidal or homicidal thoughts you must seek medical attention immediately by calling 911 or calling your MD immediately  if symptoms less severe.  You Must read complete  instructions/literature along with all the possible adverse reactions/side effects for all the Medicines you take and that have been prescribed to you. Take any new Medicines after you have completely understood and accept all the possible adverse reactions/side effects.   Please note  You were cared for by a hospitalist during your hospital stay. If you have any questions about your discharge medications or the care you received while you were in the hospital after you are discharged, you can call the unit and asked to speak with the hospitalist on call if the hospitalist that took care of you is not available. Once you are discharged, your primary care physician will handle any further medical issues. Please note that NO REFILLS for any discharge medications will be authorized once you are discharged, as it is imperative that you return to your primary care physician (or establish a relationship with a primary care physician if you do not have one) for your aftercare needs so that they can reassess your need for medications and monitor your lab values.    Today   CHIEF COMPLAINT:   Chief  Complaint  Patient presents with  . Fall  . Leg Pain    HISTORY OF PRESENT ILLNESS:  Jill Hancock  is a 78 y.o. female with a known history of multiple medical problems that will be mentioned below, who presented to the emergency room with acute onset of left hip pain status post mechanical fall.  Patient was going to the kitchen and lost balance falling on her left side.  She denied any presyncope or syncope.  No headache or dizziness or blurred vision or any head injury.  She denied any chest pain or palpitations before or after her fall.  No nausea vomiting or abdominal pain.  No dysuria, oliguria or hematuria or flank pain.  No other bleeding diathesis.  Upon presentation to the emergency room, respiratory rate was 24 and otherwise vital signs were within normal.  Labs revealed borderline potassium of  3.5 and a blood glucose of 174 and troponin I less than 0.03 with mild leukocytosis of 13.  COVID 19 test is currently pending.  Portable chest ray showed no acute cardiopulmonary disease and x-ray revealed comminuted left femoral intertrochanteric fracture with varus impaction.  EKG showed normal sinus rhythm with rate of 83 with left axis deviation and low voltage QRS with poor R wave progression.  The patient was given 2 mg IV morphine sulfate twice and 4 mg of IV Zofran.  She will be admitted to a surgical bed for further evaluation and management.   VITAL SIGNS:  Blood pressure 118/75, pulse (!) 101, temperature 98.2 F (36.8 C), resp. rate 19, height  (1.6 m), weight 68 kg, SpO2 95 %.  I/O:    Intake/Output Summary (Last 24 hours) at 11/26/2018 1520 Last data filed at 11/26/2018 1347 Gross per 24 hour  Intake 240 ml  Output 2150 ml  Net -1910 ml    PHYSICAL EXAMINATION:   GENERAL:  78 y.o.-year-old patient lying in the bed with no acute distress.  EYES: Pupils equal, round, reactive to light and accommodation. No scleral icterus. Extraocular muscles intact.  HEENT: Head atraumatic, normocephalic. Oropharynx and nasopharynx clear.  NECK:  Supple, no jugular venous distention. No thyroid enlargement, no tenderness.  LUNGS: Normal breath sounds bilaterally, no wheezing, rales,rhonchi or crepitation. No use of accessory muscles of respiration.  Decreased bibasilar breath sounds. CARDIOVASCULAR: S1, S2 normal. No  rubs, or gallops.  2/6 systolic murmur is present ABDOMEN: Soft, nontender, nondistended. Bowel sounds present. No organomegaly or mass.  EXTREMITIES: No pedal edema, cyanosis, or clubbing.  NEUROLOGIC: Cranial nerves II through XII are intact. Muscle strength 5/5 in all extremities except left leg due to pain in the left hip. Sensation intact. Gait not checked.  PSYCHIATRIC: The patient is  Alert, speech is slurred. oriented SKIN: No obvious rash, lesion, or ulcer.    DATA REVIEW:   CBC Recent Labs  Lab 11/24/18 0435  WBC 10.9*  HGB 8.8*  HCT 26.6*  PLT 263    Chemistries  Recent Labs  Lab 11/21/18 0030  11/26/18 0547  NA 140   < > 138  K 3.5   < > 3.1*  CL 106   < > 102  CO2 23   < > 26  GLUCOSE 174*   < > 119*  BUN 16   < > 10  CREATININE 0.82   < > 0.61  CALCIUM 8.9   < > 8.4*  AST 22  --   --   ALT 19  --   --  ALKPHOS 45  --   --   BILITOT 0.4  --   --    < > = values in this interval not displayed.    Cardiac Enzymes Recent Labs  Lab 11/21/18 0030  TROPONINI <0.03    Microbiology Results  Results for orders placed or performed during the hospital encounter of 11/21/18  SARS Coronavirus 2 (CEPHEID - Performed in [facility] hospital lab), Hosp Order     Status: None   Collection Time: 11/21/18  2:39 AM  Result Value Ref Range Status   SARS Coronavirus 2 NEGATIVE NEGATIVE Final    Comment: (NOTE) If result is NEGATIVE SARS-CoV-2 target nucleic acids are NOT DETECTED. The SARS-CoV-2 RNA is generally detectable in upper and lower  respiratory specimens during the acute phase of infection. The lowest  concentration of SARS-CoV-2 viral copies this assay can detect is 250  copies / mL. A negative result does not preclude SARS-CoV-2 infection  and should not be used as the sole basis for treatment or other  patient management decisions.  A negative result may occur with  improper specimen collection / handling, submission of specimen other  than nasopharyngeal swab, presence of viral mutation(s) within the  areas targeted by this assay, and inadequate number of viral copies  (<250 copies / mL). A negative result must be combined with clinical  observations, patient history, and epidemiological information. If result is POSITIVE SARS-CoV-2 target nucleic acids are DETECTED. The SARS-CoV-2 RNA is generally detectable in upper and lower  respiratory specimens dur ing the acute phase of infection.  Positive  results  are indicative of active infection with SARS-CoV-2.  Clinical  correlation with patient history and other diagnostic information is  necessary to determine patient infection status.  Positive results do  not rule out bacterial infection or co-infection with other viruses. If result is PRESUMPTIVE POSTIVE SARS-CoV-2 nucleic acids MAY BE PRESENT.   A presumptive positive result was obtained on the submitted specimen  and confirmed on repeat testing.  While 2019 novel coronavirus  (SARS-CoV-2) nucleic acids may be present in the submitted sample  additional confirmatory testing may be necessary for epidemiological  and / or clinical management purposes  to differentiate between  SARS-CoV-2 and other Sarbecovirus currently known to infect humans.  If clinically indicated additional testing with an alternate test  methodology [phone]) is advised. The SARS-CoV-2 RNA is generally  detectable in upper and lower respiratory sp ecimens during the acute  phase of infection. The expected result is Negative. Fact Sheet for Patients:  BoilerBrush.com.cy Fact Sheet for Healthcare Providers: https://pope.com/ This test is not yet approved or cleared by the [place] FDA and has been authorized for detection and/or diagnosis of SARS-CoV-2 by FDA under an Emergency Use Authorization (EUA).  This EUA will remain in effect (meaning this test can be used) for the duration of the COVID-19 declaration under Section 564(b)(1) of the Act, 21 U.S.C. section 360bbb-3(b)(1), unless the authorization is terminated or revoked sooner. Performed at [facility], [address] [place], [place] [address]   Surgical PCR screen     Status: None   Collection Time: 11/21/18  3:35 AM  Result Value Ref Range Status   MRSA, PCR NEGATIVE NEGATIVE Final   Staphylococcus aureus NEGATIVE NEGATIVE Final    Comment: (NOTE) The Xpert SA Assay (FDA approved for  NASAL specimens in patients 30 years of age and older), is one component of a comprehensive surveillance program. It is not intended to  diagnose infection nor to guide or monitor treatment. Performed at [facility], [address] [place], [place] [address]     RADIOLOGY:  No results found.  EKG:   Orders placed or performed during the hospital encounter of 11/21/18  . ED EKG  . ED EKG  . EKG 12-Lead  . EKG 12-Lead      Management plans discussed with the patient, family and they are in agreement.  CODE STATUS:     Code Status Orders  (From admission, onward)         Start     Ordered   11/21/18 0155  Full code  Continuous     11/21/18 0159        Code Status History    Date Active Date Inactive Code Status Order ID Comments User Context   12/31/2017 1641 01/02/2018 2218 Full Code [id number]  Ihor AustinPyreddy, Pavan, MD Inpatient    Advance Directive Documentation     Most Recent Value  Type of Advance Directive  Healthcare Power of Attorney, Living will  Pre-existing out of facility DNR order (yellow form or pink MOST form)  -  "MOST" Form in Place?  -      TOTAL TIME TAKING CARE OF THIS PATIENT: 35 minutes.    Altamese DillingVaibhavkumar Kalai Baca M.D on 11/26/2018 at 3:20 PM  Between 7am to 6pm - Pager - [phone]  After 6pm go to [email or web address] - password [email or web address]  [facility]  Office  [phone]  CC: Primary care physician; Smitty CordsKaramalegos, Alexander J, DO   Note: This dictation was prepared with Dragon dictation along with smaller phrase technology. Any transcriptional errors that result from this process are unintentional.

## 2018-11-26 NOTE — Progress Notes (Signed)
Physical Therapy Treatment Patient Details Name: Jill CockerLinda S Hancock MRN: [id number] DOB: May 26, 1941 Today's Date: 11/26/2018    History of Present Illness 77yo female presented to ER status post mechanical fall in home environment with immediate onset L hip pain; admitted with comminuted L femoral intertrochanteric hip fracture, s/p surgical repair (IM nailing) 5/21.  WBAT    PT Comments    Pt presents with deficits in strength, transfers, mobility, gait, balance, and activity tolerance.  Pt pleasant and willing to participate throughout session.  Pt's L hip pain at rest reported at 3/10 with max pain reported as 4-5/10 with amb.  Pt required mod-max A with bed mobility tasks and Mod A with transfers from an elevated EOB.  Pt limited in transfers by decreased trunk flexion that improved somewhat after anterior weight shifting activities in sitting.  Upon standing pt presented with min-mod posterior lean that improved significantly after standing anterior weight shifting activities with pt supporting herself with BUEs on the sink counter.  Pt then transitioned to a RW and was able to maintain standing balance with mostly CGA and occasional min A to prevent posterior LOB.  Pt was able to advance BLEs with significant effort 2-3 inches x 2 with a seated rest break between walks.  Pt will benefit from PT services in a SNF setting upon discharge to safely address above deficits for decreased caregiver assistance and eventual return to PLOF.     Follow Up Recommendations  SNF     Equipment Recommendations  Other (comment)(TBD at next venue of care)    Recommendations for Other Services       Precautions / Restrictions Precautions Precautions: Fall Restrictions Weight Bearing Restrictions: Yes LLE Weight Bearing: Weight bearing as tolerated    Mobility  Bed Mobility Overal bed mobility: Needs Assistance Bed Mobility: Supine to Sit;Sit to Supine     Supine to sit: Max assist;Mod  assist Sit to supine: Max assist;Mod assist   General bed mobility comments: Min-mod verbal cues for sequencing  Transfers Overall transfer level: Needs assistance Equipment used: Rolling walker (2 wheeled) Transfers: Sit to/from Stand Sit to Stand: Mod assist;From elevated surface         General transfer comment: Min-mod verbal and tactile cues for anterior weight shifting and general sequencing  Ambulation/Gait Ambulation/Gait assistance: Min guard;Min assist Gait Distance (Feet): 0.5 Feet Assistive device: Rolling walker (2 wheeled) Gait Pattern/deviations: Step-to pattern Gait velocity: decreased   General Gait Details: Pt able to advance BLEs 2-3 inches x 2 with a RW and CGA/Min A for stability along with min-,mod verbal cues for sequencing   Stairs             Wheelchair Mobility    Modified Rankin (Stroke Patients Only)       Balance Overall balance assessment: Needs assistance Sitting-balance support: No upper extremity supported;Feet supported Sitting balance-Leahy Scale: Fair     Standing balance support: Bilateral upper extremity supported Standing balance-Leahy Scale: Poor Standing balance comment: Occasional min A to prevent posterior LOB in standing but pt able to stand with CGA for the majority of the session                            Cognition Arousal/Alertness: Awake/alert Behavior During Therapy: [facility] for tasks assessed/performed Overall Cognitive Status: Within Functional Limits for tasks assessed  General Comments: pleasant and cooperative, following commands with min-mod verbal cues      Exercises Total Joint Exercises Ankle Circles/Pumps: AROM;Both;5 reps;10 reps Quad Sets: Strengthening;Both;5 reps;10 reps Gluteal Sets: Strengthening;Both;5 reps;10 reps Heel Slides: AAROM;Both;5 reps Hip ABduction/ADduction: AAROM;Both;5 reps Straight Leg Raises: AAROM;Both;5 reps Long  Arc Quad: AROM;Both;10 reps Knee Flexion: AROM;Both;10 reps Other Exercises Other Exercises: Anterior weight shifting activities in sitting at EOB and in standing with BUE support on sink counter    General Comments        Pertinent Vitals/Pain Pain Assessment: 0-10 Pain Score: 3  Pain Location: L hip with movement Pain Descriptors / Indicators: Aching;Sore Pain Intervention(s): Premedicated before session;Monitored during session    Home Living                      Prior Function            PT Goals (current goals can now be found in the care plan section) Progress towards PT goals: Progressing toward goals    Frequency    BID      PT Plan Current plan remains appropriate    Co-evaluation              AM-PAC PT "6 Clicks" Mobility   Outcome Measure  Help needed turning from your back to your side while in a flat bed without using bedrails?: A Lot Help needed moving from lying on your back to sitting on the side of a flat bed without using bedrails?: A Lot Help needed moving to and from a bed to a chair (including a wheelchair)?: A Lot Help needed standing up from a chair using your arms (e.g., wheelchair or bedside chair)?: A Lot Help needed to walk in hospital room?: Total Help needed climbing 3-5 steps with a railing? : Total 6 Click Score: 10    End of Session Equipment Utilized During Treatment: Gait belt Activity Tolerance: Patient tolerated treatment well Patient left: in bed;with call bell/phone within reach;with bed alarm set Nurse Communication: Mobility status PT Visit Diagnosis: Muscle weakness (generalized) (M62.81);Difficulty in walking, not elsewhere classified (R26.2);Pain Pain - Right/Left: Left Pain - part of body: Hip     Time: 2671-2458 PT Time Calculation (min) (ACUTE ONLY): 41 min  Charges:  $Gait Training: 8-22 mins $Therapeutic Exercise: 8-22 mins $Therapeutic Activity: 8-22 mins                     D. Scott  Willena Jeancharles PT, DPT 11/26/18, 2:49 PM

## 2018-11-26 NOTE — Progress Notes (Signed)
Called report to [facility]. EMS called for transport.

## 2018-11-26 NOTE — Progress Notes (Signed)
  Subjective:  POD #5 s/p IM fixation for left IT hip fracture.   Patient reports left pain as mild to moderate.  Tolerating PO diet.    Objective:   VITALS:   Vitals:   11/25/18 1700 11/25/18 1945 11/26/18 0559 11/26/18 0900  BP: 125/69 130/74 135/76 118/75  Pulse: 94 98 86 (!) 101  Resp: 16 19    Temp:  98.3 F (36.8 C) 98.2 F (36.8 C)   TempSrc:      SpO2: 92% 92% 95%   Weight:      Height:        PHYSICAL EXAM: Left lower extremity: Neurovascular intact Sensation intact distally Intact pulses distally Dorsiflexion/Plantar flexion intact Incision: dressing C/D/I No cellulitis present Compartment soft  LABS  Results for orders placed or performed during the hospital encounter of 11/21/18 (from the past 24 hour(s))  Basic metabolic panel     Status: Abnormal   Collection Time: 11/26/18  5:47 AM  Result Value Ref Range   Sodium 138 135 - 145 mmol/L   Potassium 3.1 (L) 3.5 - 5.1 mmol/L   Chloride 102 98 - 111 mmol/L   CO2 26 22 - 32 mmol/L   Glucose, Bld 119 (H) 70 - 99 mg/dL   BUN 10 8 - 23 mg/dL   Creatinine, Ser 0.01 0.44 - 1.00 mg/dL   Calcium 8.4 (L) 8.9 - 10.3 mg/dL   GFR calc non Af Amer >60 >60 mL/min   GFR calc Af Amer >60 >60 mL/min   Anion gap 10 5 - 15    No results found.  Assessment/Plan: 5 Days Post-Op   Active Problems:   Closed left hip fracture (HCC)  Patient stable post-op.  Making progress with PT.  Continue lovenox.  Continue PT.   Will need SNF upon discharge.       Juanell Fairly , MD 11/26/2018, 1:07 PM

## 2018-11-27 ENCOUNTER — Ambulatory Visit: Payer: Self-pay | Admitting: Family Medicine

## 2018-11-27 NOTE — Chronic Care Management (AMB) (Signed)
Chronic Care Management   Note  11/27/2018 Name: SONDRA BLIXT MRN: [id number] DOB: Jan 19, 1941  Christean Grief Heaton is a 78 y.o. year old female who is a primary care patient of Olin Hauser, DO. I reached out to [employer] by phone today in response to a referral sent by Ms. Christean Grief Gradilla's health plan.    Ms. Degraaf was given information about Chronic Care Management services today including:  1. CCM service includes personalized support from designated clinical staff supervised by her physician, including individualized plan of care and coordination with other care providers 2. 24/7 contact phone numbers for assistance for urgent and routine care needs. 3. Service will only be billed when office clinical staff spend 20 minutes or more in a month to coordinate care. 4. Only one practitioner may furnish and bill the service in a calendar month. 5. The patient may stop CCM services at any time (effective at the end of the month) by phone call to the office staff. 6. The patient will be responsible for cost sharing (co-pay) of up to 20% of the service fee (after annual deductible is met).  Daughter [name] did not agree to services and does not wish to consider at this time. Because her mom fell and is in rehab.    Follow up plan: The [facility] has been provided with contact information for the chronic care management team and has been advised to call with any health related questions or concerns.   [place]  [email or web address]   ??[phone]

## 2018-11-28 DIAGNOSIS — S72002D Fracture of unspecified part of neck of left femur, subsequent encounter for closed fracture with routine healing: Secondary | ICD-10-CM | POA: Diagnosis not present

## 2018-11-28 DIAGNOSIS — Z8673 Personal history of transient ischemic attack (TIA), and cerebral infarction without residual deficits: Secondary | ICD-10-CM | POA: Diagnosis not present

## 2018-11-28 DIAGNOSIS — E785 Hyperlipidemia, unspecified: Secondary | ICD-10-CM | POA: Diagnosis not present

## 2018-11-28 DIAGNOSIS — F419 Anxiety disorder, unspecified: Secondary | ICD-10-CM | POA: Diagnosis not present

## 2018-11-28 DIAGNOSIS — K219 Gastro-esophageal reflux disease without esophagitis: Secondary | ICD-10-CM | POA: Diagnosis not present

## 2018-12-04 DIAGNOSIS — M1711 Unilateral primary osteoarthritis, right knee: Secondary | ICD-10-CM | POA: Diagnosis not present

## 2018-12-05 DIAGNOSIS — Z8673 Personal history of transient ischemic attack (TIA), and cerebral infarction without residual deficits: Secondary | ICD-10-CM | POA: Diagnosis not present

## 2018-12-05 DIAGNOSIS — I10 Essential (primary) hypertension: Secondary | ICD-10-CM | POA: Diagnosis not present

## 2018-12-05 DIAGNOSIS — F419 Anxiety disorder, unspecified: Secondary | ICD-10-CM | POA: Diagnosis not present

## 2018-12-05 DIAGNOSIS — E785 Hyperlipidemia, unspecified: Secondary | ICD-10-CM | POA: Diagnosis not present

## 2018-12-05 DIAGNOSIS — S72002D Fracture of unspecified part of neck of left femur, subsequent encounter for closed fracture with routine healing: Secondary | ICD-10-CM | POA: Diagnosis not present

## 2018-12-05 DIAGNOSIS — K219 Gastro-esophageal reflux disease without esophagitis: Secondary | ICD-10-CM | POA: Diagnosis not present

## 2018-12-14 DIAGNOSIS — K219 Gastro-esophageal reflux disease without esophagitis: Secondary | ICD-10-CM | POA: Diagnosis not present

## 2018-12-14 DIAGNOSIS — F419 Anxiety disorder, unspecified: Secondary | ICD-10-CM | POA: Diagnosis not present

## 2018-12-14 DIAGNOSIS — S72142D Displaced intertrochanteric fracture of left femur, subsequent encounter for closed fracture with routine healing: Secondary | ICD-10-CM | POA: Diagnosis not present

## 2018-12-14 DIAGNOSIS — Z8673 Personal history of transient ischemic attack (TIA), and cerebral infarction without residual deficits: Secondary | ICD-10-CM | POA: Diagnosis not present

## 2018-12-14 DIAGNOSIS — E785 Hyperlipidemia, unspecified: Secondary | ICD-10-CM | POA: Diagnosis not present

## 2018-12-14 DIAGNOSIS — I10 Essential (primary) hypertension: Secondary | ICD-10-CM | POA: Diagnosis not present

## 2018-12-17 ENCOUNTER — Inpatient Hospital Stay
Admission: EM | Admit: 2018-12-17 | Discharge: 2018-12-23 | DRG: 064 | Disposition: A | Discharge status: Home Health | Payer: Medicare HMO | Attending: Internal Medicine | Admitting: Internal Medicine

## 2018-12-17 ENCOUNTER — Emergency Department: Payer: Medicare HMO

## 2018-12-17 ENCOUNTER — Other Ambulatory Visit: Payer: Self-pay

## 2018-12-17 ENCOUNTER — Encounter: Payer: Self-pay | Admitting: Emergency Medicine

## 2018-12-17 DIAGNOSIS — S7222XA Displaced subtrochanteric fracture of left femur, initial encounter for closed fracture: Secondary | ICD-10-CM | POA: Diagnosis present

## 2018-12-17 DIAGNOSIS — M979XXA Periprosthetic fracture around unspecified internal prosthetic joint, initial encounter: Secondary | ICD-10-CM | POA: Diagnosis not present

## 2018-12-17 DIAGNOSIS — Z888 Allergy status to other drugs, medicaments and biological substances status: Secondary | ICD-10-CM

## 2018-12-17 DIAGNOSIS — Z9181 History of falling: Secondary | ICD-10-CM | POA: Diagnosis not present

## 2018-12-17 DIAGNOSIS — I639 Cerebral infarction, unspecified: Secondary | ICD-10-CM | POA: Diagnosis not present

## 2018-12-17 DIAGNOSIS — M810 Age-related osteoporosis without current pathological fracture: Secondary | ICD-10-CM | POA: Diagnosis present

## 2018-12-17 DIAGNOSIS — Z8249 Family history of ischemic heart disease and other diseases of the circulatory system: Secondary | ICD-10-CM | POA: Diagnosis not present

## 2018-12-17 DIAGNOSIS — M25552 Pain in left hip: Secondary | ICD-10-CM

## 2018-12-17 DIAGNOSIS — Z79891 Long term (current) use of opiate analgesic: Secondary | ICD-10-CM

## 2018-12-17 DIAGNOSIS — Z8781 Personal history of (healed) traumatic fracture: Secondary | ICD-10-CM | POA: Diagnosis not present

## 2018-12-17 DIAGNOSIS — Z8673 Personal history of transient ischemic attack (TIA), and cerebral infarction without residual deficits: Secondary | ICD-10-CM | POA: Diagnosis not present

## 2018-12-17 DIAGNOSIS — I10 Essential (primary) hypertension: Secondary | ICD-10-CM | POA: Diagnosis not present

## 2018-12-17 DIAGNOSIS — Z1159 Encounter for screening for other viral diseases: Secondary | ICD-10-CM | POA: Diagnosis not present

## 2018-12-17 DIAGNOSIS — Z9049 Acquired absence of other specified parts of digestive tract: Secondary | ICD-10-CM | POA: Diagnosis not present

## 2018-12-17 DIAGNOSIS — Z90710 Acquired absence of both cervix and uterus: Secondary | ICD-10-CM

## 2018-12-17 DIAGNOSIS — Z7982 Long term (current) use of aspirin: Secondary | ICD-10-CM | POA: Diagnosis not present

## 2018-12-17 DIAGNOSIS — Z79899 Other long term (current) drug therapy: Secondary | ICD-10-CM

## 2018-12-17 DIAGNOSIS — M9702XA Periprosthetic fracture around internal prosthetic left hip joint, initial encounter: Secondary | ICD-10-CM | POA: Diagnosis present

## 2018-12-17 DIAGNOSIS — I6782 Cerebral ischemia: Secondary | ICD-10-CM | POA: Diagnosis not present

## 2018-12-17 DIAGNOSIS — F0150 Vascular dementia without behavioral disturbance: Secondary | ICD-10-CM | POA: Diagnosis not present

## 2018-12-17 DIAGNOSIS — F329 Major depressive disorder, single episode, unspecified: Secondary | ICD-10-CM | POA: Diagnosis present

## 2018-12-17 DIAGNOSIS — R52 Pain, unspecified: Secondary | ICD-10-CM | POA: Diagnosis not present

## 2018-12-17 DIAGNOSIS — Z880 Allergy status to penicillin: Secondary | ICD-10-CM

## 2018-12-17 DIAGNOSIS — S72002A Fracture of unspecified part of neck of left femur, initial encounter for closed fracture: Secondary | ICD-10-CM | POA: Diagnosis not present

## 2018-12-17 DIAGNOSIS — R778 Other specified abnormalities of plasma proteins: Secondary | ICD-10-CM

## 2018-12-17 DIAGNOSIS — Z66 Do not resuscitate: Secondary | ICD-10-CM | POA: Diagnosis not present

## 2018-12-17 DIAGNOSIS — R7989 Other specified abnormal findings of blood chemistry: Secondary | ICD-10-CM | POA: Diagnosis not present

## 2018-12-17 DIAGNOSIS — I6523 Occlusion and stenosis of bilateral carotid arteries: Secondary | ICD-10-CM | POA: Diagnosis not present

## 2018-12-17 DIAGNOSIS — Z823 Family history of stroke: Secondary | ICD-10-CM

## 2018-12-17 DIAGNOSIS — Z885 Allergy status to narcotic agent status: Secondary | ICD-10-CM

## 2018-12-17 DIAGNOSIS — I7389 Other specified peripheral vascular diseases: Secondary | ICD-10-CM | POA: Diagnosis present

## 2018-12-17 DIAGNOSIS — R531 Weakness: Secondary | ICD-10-CM | POA: Diagnosis not present

## 2018-12-17 DIAGNOSIS — S79929A Unspecified injury of unspecified thigh, initial encounter: Secondary | ICD-10-CM | POA: Diagnosis not present

## 2018-12-17 DIAGNOSIS — I672 Cerebral atherosclerosis: Secondary | ICD-10-CM | POA: Diagnosis present

## 2018-12-17 DIAGNOSIS — Z7401 Bed confinement status: Secondary | ICD-10-CM | POA: Diagnosis not present

## 2018-12-17 DIAGNOSIS — F0390 Unspecified dementia without behavioral disturbance: Secondary | ICD-10-CM | POA: Diagnosis present

## 2018-12-17 DIAGNOSIS — F419 Anxiety disorder, unspecified: Secondary | ICD-10-CM | POA: Diagnosis present

## 2018-12-17 DIAGNOSIS — R079 Chest pain, unspecified: Secondary | ICD-10-CM | POA: Diagnosis not present

## 2018-12-17 DIAGNOSIS — G459 Transient cerebral ischemic attack, unspecified: Secondary | ICD-10-CM | POA: Diagnosis not present

## 2018-12-17 DIAGNOSIS — E785 Hyperlipidemia, unspecified: Secondary | ICD-10-CM | POA: Diagnosis not present

## 2018-12-17 DIAGNOSIS — R5381 Other malaise: Secondary | ICD-10-CM | POA: Diagnosis not present

## 2018-12-17 DIAGNOSIS — Z03818 Encounter for observation for suspected exposure to other biological agents ruled out: Secondary | ICD-10-CM | POA: Diagnosis not present

## 2018-12-17 DIAGNOSIS — S72142D Displaced intertrochanteric fracture of left femur, subsequent encounter for closed fracture with routine healing: Secondary | ICD-10-CM | POA: Diagnosis not present

## 2018-12-17 DIAGNOSIS — M2550 Pain in unspecified joint: Secondary | ICD-10-CM | POA: Diagnosis not present

## 2018-12-17 LAB — CBC WITH DIFFERENTIAL/PLATELET
Abs Immature Granulocytes: 0.05 10*3/uL (ref 0.00–0.07)
Basophils Absolute: 0 10*3/uL (ref 0.0–0.1)
Basophils Relative: 0 %
Eosinophils Absolute: 0.2 10*3/uL (ref 0.0–0.5)
Eosinophils Relative: 2 %
HCT: 35.1 % — ABNORMAL LOW (ref 36.0–46.0)
Hemoglobin: 11.2 g/dL — ABNORMAL LOW (ref 12.0–15.0)
Immature Granulocytes: 1 %
Lymphocytes Relative: 19 %
Lymphs Abs: 1.6 10*3/uL (ref 0.7–4.0)
MCH: 28.5 pg (ref 26.0–34.0)
MCHC: 31.9 g/dL (ref 30.0–36.0)
MCV: 89.3 fL (ref 80.0–100.0)
Monocytes Absolute: 0.7 10*3/uL (ref 0.1–1.0)
Monocytes Relative: 8 %
Neutro Abs: 6.1 10*3/uL (ref 1.7–7.7)
Neutrophils Relative %: 70 %
Platelets: 456 10*3/uL — ABNORMAL HIGH (ref 150–400)
RBC: 3.93 MIL/uL (ref 3.87–5.11)
RDW: 15.1 % (ref 11.5–15.5)
WBC: 8.6 10*3/uL (ref 4.0–10.5)
nRBC: 0 % (ref 0.0–0.2)

## 2018-12-17 LAB — TROPONIN I
Troponin I: 0.07 ng/mL (ref <0.03)
Troponin I: 0.06 ng/mL (ref <0.03)

## 2018-12-17 LAB — BASIC METABOLIC PANEL
Anion gap: 8 (ref 5–15)
BUN: 12 mg/dL (ref 8–23)
CO2: 25 mmol/L (ref 22–32)
Calcium: 9 mg/dL (ref 8.9–10.3)
Chloride: 101 mmol/L (ref 98–111)
Creatinine, Ser: 0.73 mg/dL (ref 0.44–1.00)
GFR calc Af Amer: >60 mL/min (ref >60)
GFR calc non Af Amer: >60 mL/min (ref >60)
Glucose, Bld: 107 mg/dL — ABNORMAL HIGH (ref 70–99)
Potassium: 3.6 mmol/L (ref 3.5–5.1)
Sodium: 134 mmol/L — ABNORMAL LOW (ref 135–145)

## 2018-12-17 LAB — SARS CORONAVIRUS 2 BY RT PCR (HOSPITAL ORDER, PERFORMED IN ~~LOC~~ HOSPITAL LAB): SARS Coronavirus 2: NEGATIVE

## 2018-12-17 LAB — LIPID PANEL
Cholesterol: 134 mg/dL (ref 0–200)
HDL: 60 mg/dL (ref >40)
LDL Cholesterol: 53 mg/dL (ref 0–99)
Total CHOL/HDL Ratio: 2.2 RATIO
Triglycerides: 107 mg/dL (ref <150)
VLDL: 21 mg/dL (ref 0–40)

## 2018-12-17 LAB — LACTIC ACID, PLASMA: Lactic Acid, Venous: 1.3 mmol/L (ref 0.5–1.9)

## 2018-12-17 MED ORDER — STROKE: EARLY STAGES OF RECOVERY BOOK
Freq: Once | Status: AC
  Administered 2018-12-17: 23:00:00

## 2018-12-17 MED ORDER — ASPIRIN EC 81 MG PO TBEC
81.0000 mg | DELAYED_RELEASE_TABLET | Freq: Every day | ORAL | Status: DC
  Administered 2018-12-18 – 2018-12-23 (×6): 81 mg via ORAL
  Filled 2018-12-17 (×6): qty 1

## 2018-12-17 MED ORDER — ADULT MULTIVITAMIN W/MINERALS CH
1.0000 | ORAL_TABLET | Freq: Every day | ORAL | Status: DC
  Administered 2018-12-18 – 2018-12-23 (×6): 1 via ORAL
  Filled 2018-12-17 (×6): qty 1

## 2018-12-17 MED ORDER — LISINOPRIL 20 MG PO TABS
40.0000 mg | ORAL_TABLET | Freq: Every day | ORAL | Status: DC
  Administered 2018-12-18 – 2018-12-23 (×6): 40 mg via ORAL
  Filled 2018-12-17 (×6): qty 2

## 2018-12-17 MED ORDER — ACETAMINOPHEN 160 MG/5ML PO SOLN
650.0000 mg | ORAL | Status: DC | PRN
  Filled 2018-12-17: qty 20.3

## 2018-12-17 MED ORDER — PANTOPRAZOLE SODIUM 40 MG PO TBEC
40.0000 mg | DELAYED_RELEASE_TABLET | Freq: Every day | ORAL | Status: DC
  Administered 2018-12-18 – 2018-12-23 (×6): 40 mg via ORAL
  Filled 2018-12-17 (×6): qty 1

## 2018-12-17 MED ORDER — ONDANSETRON HCL 4 MG/2ML IJ SOLN
4.0000 mg | Freq: Once | INTRAMUSCULAR | Status: AC
  Administered 2018-12-17: 14:00:00 4 mg via INTRAVENOUS
  Filled 2018-12-17: qty 2

## 2018-12-17 MED ORDER — ACETAMINOPHEN 325 MG PO TABS
650.0000 mg | ORAL_TABLET | ORAL | Status: DC | PRN
  Administered 2018-12-20 – 2018-12-22 (×2): 650 mg via ORAL
  Filled 2018-12-17 (×2): qty 2

## 2018-12-17 MED ORDER — POLYETHYLENE GLYCOL 3350 17 G PO PACK: 17.0000 g | PACK | Freq: Every day | ORAL | Status: DC | PRN

## 2018-12-17 MED ORDER — VITAMIN D3 25 MCG (1000 UNIT) PO TABS
5000.0000 [IU] | ORAL_TABLET | Freq: Every day | ORAL | Status: DC
  Administered 2018-12-18 – 2018-12-23 (×6): 5000 [IU] via ORAL
  Filled 2018-12-17 (×13): qty 5

## 2018-12-17 MED ORDER — MORPHINE SULFATE (PF) 4 MG/ML IV SOLN
4.0000 mg | Freq: Once | INTRAVENOUS | Status: AC
  Administered 2018-12-17: 14:00:00 4 mg via INTRAVENOUS
  Filled 2018-12-17: qty 1

## 2018-12-17 MED ORDER — DOCUSATE SODIUM 100 MG PO CAPS
100.0000 mg | ORAL_CAPSULE | Freq: Two times a day (BID) | ORAL | Status: DC
  Administered 2018-12-18 – 2018-12-23 (×10): 100 mg via ORAL
  Filled 2018-12-17 (×11): qty 1

## 2018-12-17 MED ORDER — AMLODIPINE BESYLATE 10 MG PO TABS
10.0000 mg | ORAL_TABLET | Freq: Every day | ORAL | Status: DC
  Administered 2018-12-18 – 2018-12-23 (×6): 10 mg via ORAL
  Filled 2018-12-17 (×6): qty 1

## 2018-12-17 MED ORDER — PRAVASTATIN SODIUM 20 MG PO TABS: 20.0000 mg | ORAL_TABLET | Freq: Every day | ORAL | Status: DC

## 2018-12-17 MED ORDER — LORAZEPAM 0.5 MG PO TABS
0.5000 mg | ORAL_TABLET | Freq: Every evening | ORAL | Status: DC | PRN
  Administered 2018-12-17 – 2018-12-22 (×6): 0.5 mg via ORAL
  Filled 2018-12-17 (×6): qty 1

## 2018-12-17 MED ORDER — FLUOXETINE HCL 20 MG PO CAPS
40.0000 mg | ORAL_CAPSULE | Freq: Every day | ORAL | Status: DC
  Administered 2018-12-18 – 2018-12-23 (×6): 40 mg via ORAL
  Filled 2018-12-17 (×7): qty 2

## 2018-12-17 MED ORDER — TRAMADOL HCL 50 MG PO TABS
50.0000 mg | ORAL_TABLET | Freq: Four times a day (QID) | ORAL | Status: DC | PRN
  Administered 2018-12-17 – 2018-12-18 (×3): 50 mg via ORAL
  Filled 2018-12-17 (×3): qty 1

## 2018-12-17 MED ORDER — ACETAMINOPHEN 500 MG PO TABS
500.0000 mg | ORAL_TABLET | Freq: Three times a day (TID) | ORAL | Status: DC
  Administered 2018-12-17 – 2018-12-23 (×17): 500 mg via ORAL
  Filled 2018-12-17 (×17): qty 1

## 2018-12-17 MED ORDER — POTASSIUM CHLORIDE CRYS ER 10 MEQ PO TBCR
10.0000 meq | EXTENDED_RELEASE_TABLET | Freq: Every day | ORAL | Status: DC
  Administered 2018-12-18 – 2018-12-23 (×6): 10 meq via ORAL
  Filled 2018-12-17 (×6): qty 1

## 2018-12-17 MED ORDER — ACETAMINOPHEN 650 MG RE SUPP: 650.0000 mg | RECTAL | Status: DC | PRN

## 2018-12-17 MED ORDER — ENOXAPARIN SODIUM 40 MG/0.4ML ~~LOC~~ SOLN
40.0000 mg | SUBCUTANEOUS | Status: DC
  Administered 2018-12-17 – 2018-12-22 (×6): 40 mg via SUBCUTANEOUS
  Filled 2018-12-17 (×6): qty 0.4

## 2018-12-17 NOTE — Consult Note (Signed)
ORTHOPAEDIC CONSULTATION  REQUESTING PHYSICIAN: Enedina FinnerPatel, Sona, MD  Chief Complaint:   Left hip pain.  History of Present Illness: Jill Hancock is a 78 y.o. female with a history of anxiety/depression, osteoporosis, hyperglycemia, hypertension, and is status post a stroke with resultant unsteady gait.  She apparently fell 5 weeks ago and injured her left hip.  X-rays at that time demonstrated an intertrochanteric fracture of the left hip.  She underwent placement of a short intramedullary nail by Dr. Martha ClanKrasinski.  Postoperatively, the patient was transferred to a rehab facility.  Yesterday, she was discharged home to live with her daughter.  Her daughter noticed increased pain and weakness in the left lower extremity, making it difficult for the patient ambulate and recommended that she be brought back to the emergency room.  The patient denies any falls within the past week or so.  Past Medical History:  Diagnosis Date  . Anxiety   . Depression   . Hyperglycemia   . Hypertension   . Hypokalemia   . Osteoporosis   . Stroke [facility]) 02/2012   MRI revealed at least 3 subcentimeter acute infarctions with one area of subacute infarction in widely disparate vascular territories including territory of left cerebellum and around right caudate nucleus along with widespread lacunar infarcts, chronic microvascular ischemic change, and numerous microbleeds suggesting long standing hypertensive cerebrovascular disease.  MRA confirmed intracranial  . Unsteady gait   . Vertigo    Past Surgical History:  Procedure Laterality Date  . BREAST BIOPSY Right    pt not sure when   . CHOLECYSTECTOMY    . INTRAMEDULLARY (IM) NAIL INTERTROCHANTERIC Left 11/21/2018   Procedure: INTRAMEDULLARY (IM) NAIL INTERTROCHANTRIC;  Surgeon: Juanell FairlyKrasinski, Kevin, MD;  Location: [facility] ORS;  Service: Orthopedics;  Laterality: Left;  Marland Kitchen. VAGINAL HYSTERECTOMY     Social  History   Socioeconomic History  . Marital status: Divorced    Spouse name: Not on file  . Number of children: 5  . Years of education: Graduate - [employer]  . Highest education level: Associate degree: occupational, Scientist, product/process developmenttechnical, or vocational program  Occupational History  . Occupation: Retired Production assistant, radioN  Social Needs  . Financial resource strain: Not hard at all  . Food insecurity    Worry: Never true    Inability: Never true  . Transportation needs    Medical: No    Non-medical: No  Tobacco Use  . Smoking status: Never Smoker  . Smokeless tobacco: Never Used  Substance and Sexual Activity  . Alcohol use: No    Alcohol/week: 0.0 standard drinks  . Drug use: No  . Sexual activity: Yes  Lifestyle  . Physical activity    Days per week: 0 days    Minutes per session: 0 min  . Stress: Only a little  Relationships  . Social Musicianconnections    Talks on phone: Once a week    Gets together: Twice a week    Attends religious service: Never    Active member of club or organization: No    Attends meetings of clubs or organizations: Never    Relationship status: Divorced  Other Topics Concern  . Not on file  Social History Narrative   Lives at home alone, has 4 daughters that come and help daily   Family History  Problem Relation Age of Onset  . Heart attack Mother   . Heart disease Mother   . Heart disease Father   . Heart attack Brother  death at age 51  . Stroke Brother        death at 24  . Bladder Cancer Neg Hx   . Kidney cancer Neg Hx    Allergies  Allergen Reactions  . Demerol [Meperidine] Nausea And Vomiting  . Macrobid [Nitrofurantoin Macrocrystal] Rash  . Penicillins Rash    Has patient had a PCN reaction causing immediate rash, facial/tongue/throat swelling, SOB or lightheadedness with hypotension: Unknown Has patient had a PCN reaction causing severe rash involving mucus membranes or skin necrosis: Unknown Has patient had a PCN reaction that required hospitalization:  Unknown Has patient had a PCN reaction occurring within the last 10 years: No If all of the above answers are "NO", then may proceed with Cephalosporin use.    Prior to Admission medications   Medication Sig Start Date End Date Taking? Authorizing Provider  acetaminophen (TYLENOL) 500 MG tablet Take 500 mg by mouth 3 (three) times daily.    Yes [provider]  alendronate (FOSAMAX) 70 MG tablet TAKE 1 TABLET EVERY 7 DAYS.  SEE PACKAGE FOR ADDITIONAL INSTRUCTIONS Patient taking differently: Take 70 mg by mouth every Monday.  02/22/18  Yes Karamalegos, Alexander J, DO  amLODipine (NORVASC) 10 MG tablet TAKE 1 TABLET BY MOUTH EVERY DAY Patient taking differently: Take 10 mg by mouth daily.  08/26/18  Yes Karamalegos, Devonne Doughty, DO  aspirin 81 MG tablet Take 1 tablet (81 mg total) by mouth daily. 03/07/13  Yes Philmore Pali, NP  Cholecalciferol (VITAMIN D3) 125 MCG (5000 UT) CAPS Take 1 capsule (5,000 Units total) by mouth daily. For 12 weeks, then start Vitamin D3 2,000 units daily (OTC) 07/29/18  Yes Karamalegos, Devonne Doughty, DO  Cranberry-Vitamin C (AZO CRANBERRY URINARY TRACT PO) Take 2 tablets by mouth daily. 03/20/18  Yes [provider]  docusate sodium (COLACE) 100 MG capsule Take 1 capsule (100 mg total) by mouth 2 (two) times daily. 11/26/18  Yes [name], MD  esomeprazole (NEXIUM) 20 MG capsule Take 1 capsule (20 mg total) by mouth daily before breakfast. 12/14/17  Yes Karamalegos, Devonne Doughty, DO  FLUoxetine (PROZAC) 40 MG capsule Take 1 capsule (40 mg total) by mouth daily. 05/28/18  Yes Karamalegos, Alexander J, DO  ipratropium (ATROVENT) 0.06 % nasal spray Place 2 sprays into both nostrils 4 (four) times daily. For up to 5-7 days then stop. 04/10/18  Yes Karamalegos, Devonne Doughty, DO  lisinopril (PRINIVIL,ZESTRIL) 40 MG tablet Take 1 tablet (40 mg total) by mouth daily. 02/22/18  Yes Karamalegos, Devonne Doughty, DO  Multiple Vitamins-Minerals (MULTIVITAMIN WITH  MINERALS) tablet Take 1 tablet by mouth daily.   Yes [provider]  polyethylene glycol (MIRALAX / GLYCOLAX) 17 g packet Take 17 g by mouth daily as needed for mild constipation. 11/26/18  Yes [name], MD  potassium chloride (K-DUR,KLOR-CON) 10 MEQ tablet Take 1 tablet (10 mEq total) by mouth daily. 05/28/18  Yes Karamalegos, Devonne Doughty, DO  pravastatin (PRAVACHOL) 20 MG tablet Take 1 tablet (20 mg total) by mouth at bedtime. 02/22/18  Yes Karamalegos, Devonne Doughty, DO   Ct Head Wo Contrast  Result Date: 12/17/2018 CLINICAL DATA:  History of stroke EXAM: CT HEAD WITHOUT CONTRAST TECHNIQUE: Contiguous axial images were obtained from the base of the skull through the vertex without intravenous contrast. COMPARISON:  11/22/2018 FINDINGS: Brain: No evidence of acute infarction, hemorrhage, hydrocephalus, extra-axial collection or mass lesion/mass effect. There is extensive, confluent periventricular and deep white matter hypodensity of the bilateral cerebral hemispheres  Vascular: No hyperdense vessel or unexpected calcification. Skull: Normal. Negative for fracture or focal lesion. Sinuses/Orbits: No acute finding. Other: None. IMPRESSION: No acute intracranial pathology. There is extensive, confluent periventricular and deep white matter hypodensity of the bilateral cerebral hemispheres, consistent with advanced small-vessel white matter disease and prior infarctions. Consider MRI to more sensitively evaluate for acutely superimposed diffusion restricting infarction in this background. Electronically Signed   By: Lauralyn PrimesAlex  Bibbey M.D.   On: 12/17/2018 14:48   Mr Brain Wo Contrast  Result Date: 12/17/2018 CLINICAL DATA:  Right lower extremity weakness EXAM: MRI HEAD WITHOUT CONTRAST TECHNIQUE: Multiplanar, multiecho pulse sequences of the brain and surrounding structures were obtained without intravenous contrast. COMPARISON:  Head CT 12/17/2018 FINDINGS: BRAIN: Punctate focus of abnormal  diffusion restriction within the left pons. There is also blooming in this area on the T2-weighted gradient echo sequence. No other diffusion abnormality. The midline structures are normal. There are old infarcts of the right cerebellum, right pons and both deep gray nuclei. Diffuse confluent hyperintense T2-weighted signal within the periventricular, deep and juxtacortical white matter, most commonly due to chronic ischemic microangiopathy. The cerebral and cerebellar volume are age-appropriate. No hydrocephalus. Multiple chronic microhemmorhages in a predominantly central distribution. No mass lesion. VASCULAR: The major intracranial arterial and venous sinus flow voids are normal. SKULL AND UPPER CERVICAL SPINE: Calvarial bone marrow signal is normal. There is no skull base mass. Visualized upper cervical spine and soft tissues are normal. SINUSES/ORBITS: No fluid levels or advanced mucosal thickening. No mastoid or middle ear effusion. The orbits are normal. IMPRESSION: 1. Punctate focus of abnormal diffusion restriction within the left pons with associated magnetic susceptibility effect on T2*-weighted imaging. This could represent a small acute infarct, as this location is in keeping with the reported right lower extremity weakness. However this could also be a susceptibility artifact due to chronic blood products. 2. 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 3. Numerous chronic microhemorrhages, predominantly central, most consistent with chronic hypertensive angiopathy. Electronically Signed   By: Deatra RobinsonKevin  Herman M.D.   On: 12/17/2018 17:16   Dg Chest Portable 1 View  Result Date: 12/17/2018 CLINICAL DATA:  Pain EXAM: PORTABLE CHEST 1 VIEW COMPARISON:  11/21/2018 FINDINGS: The heart size is stable. No pneumothorax. No large pleural effusion. There is likely some atelectasis versus scarring at the lung bases. No acute osseous abnormality. IMPRESSION: No active disease.  Electronically Signed   By: Katherine Mantlehristopher  Green M.D.   On: 12/17/2018 15:08   Dg Hip Unilat W Or Wo Pelvis 2-3 Views Left  Result Date: 12/17/2018 CLINICAL DATA:  The patient suffered a left intertrochanteric fracture in a fall 11/21/2018 and underwent fixation that same day. Left hip pain today. No new injury. Subsequent encounter. EXAM: DG HIP (WITH OR WITHOUT PELVIS) 2-3V LEFT COMPARISON:  Plain films left hip 11/21/2018. FINDINGS: Hip screw and short intramedullary nail are in place for fixation of a left intertrochanteric fracture. Hardware is intact. Position and alignment are unchanged. A cortical lucency is seen in the anterior and lateral cortex of the proximal diaphysis of the femur consistent with a periprosthetic fracture along the midportion of the intramedullary nail. This is not visible on the prior exam. IMPRESSION: Incomplete, nondisplaced diaphyseal fracture along the midportion of the patient's intramedullary nail is not visible on the immediate postoperative films. Left intertrochanteric fracture without change in position or alignment. Electronically Signed   By: Drusilla Kannerhomas  Dalessio M.D.   On: 12/17/2018 15:10    Positive  ROS: All other systems have been reviewed and were otherwise negative with the exception of those mentioned in the HPI and as above.  Physical Exam: General:  Alert, no acute distress Psychiatric:  Patient is competent for consent with normal mood and affect   Cardiovascular:  No pedal edema Respiratory:  No wheezing, non-labored breathing GI:  Abdomen is soft and non-tender Skin:  No lesions in the area of chief complaint Neurologic:  Sensation intact distally Lymphatic:  No axillary or cervical lymphadenopathy  Orthopedic Exam:  Orthopedic examination is limited to the left hip and lower extremity.  The surgical incisions are well-healed and without evidence for infection.  There is no swelling, erythema, ecchymosis, abrasions, or other skin abnormalities  identified.  She has mild tenderness palpation over the lateral aspect of the hip.  She also has some discomfort with passive range of motion.  She is able to flex her left hip while performing a heel slide on the bed, but is unable to do a straight leg raise.  She is neurovascularly intact to the left foot and lower extremity.  X-rays:  X-rays of the pelvis and left hip are available for review and have been reviewed by myself.  These films demonstrate a well-positioned intertrochanteric fracture of the left hip with excellent position of the short trochanteric femoral nail.  The hardware appears to be in good position without evidence of loosening.  There is a question of a nondisplaced cortical irregularity along the lateral femoral cortex several centimeters proximal to the locking screw which appears to be new as compared to her initial postoperative films.  No other acute bony abnormalities are identified.  Assessment: Possible new incomplete subtrochanteric hip fracture status post ORIF of left intertrochanteric hip fracture.  Plan: Treatment options were discussed with the patient.  The location of the presumed new subtrochanteric fracture is proximal to the interlocking screw and therefore is bridged by the intramedullary rod.  Therefore, I feel that it is reasonable to try to manage this nonsurgically.  As this is Dr. Samuel GermanyKrasinski's patient, I have contacted him and he agrees to resume management of this patient as of tomorrow morning.  He will coordinate physical therapy recommendations and further treatment at this time.  Thank you for asked me to participate in the care of this most pleasant and unfortunate woman.   Maryagnes AmosJ. Jeffrey Poggi, MD  Beeper #:  [phone]  12/17/2018 7:20 PM

## 2018-12-17 NOTE — H&P (Signed)
Jill Hancock - [name] - [place] at [facility]   PATIENT NAME: Jill Hancock    MR#:  [id number]  DATE OF BIRTH:  Aug 03, 1940  DATE OF ADMISSION:  12/17/2018  PRIMARY CARE PHYSICIAN: Smitty CordsKaramalegos, Alexander J, DO   REQUESTING/REFERRING PHYSICIAN: dr Derrill Kaygoodman  CHIEF COMPLAINT:   Right LE weakness and right LE pain HISTORY OF PRESENT ILLNESS:  Jill RalphLinda Shepherd  is a 78 y.o. female with a known history of depression, anxiety, HTN previous stroke, vertigo and unsteady gait comes to the emergency room from home after she started having right lower extremity weakness. Patient altered right lower extremity pain. She denies any fall or trauma.  It is not the best historian. According to the daughter Jill Hancock patient was discharged from [facility] yesterday. She started having some weakness in her right lower extremity and was brought to the emergency room\  X-ray of the left hip showed Incomplete, nondisplaced diaphyseal fracture along the midportion of the patient's intramedullary nail   Patient underwent MRI of the brain which showed questionable left pons lacunar infarct.  PAST MEDICAL HISTORY:   Past Medical History:  Diagnosis Date  . Anxiety   . Depression   . Hyperglycemia   . Hypertension   . Hypokalemia   . Osteoporosis   . Stroke Jill Hancock(HCC) 02/2012   MRI revealed at least 3 subcentimeter acute infarctions with one area of subacute infarction in widely disparate vascular territories including territory of left cerebellum and around right caudate nucleus along with widespread lacunar infarcts, chronic microvascular ischemic change, and numerous microbleeds suggesting long standing hypertensive cerebrovascular disease.  MRA confirmed intracranial  . Unsteady gait   . Vertigo     PAST SURGICAL HISTOIRY:   Past Surgical History:  Procedure Laterality Date  . BREAST BIOPSY Right    pt not sure when   . CHOLECYSTECTOMY    . INTRAMEDULLARY (IM) NAIL  INTERTROCHANTERIC Left 11/21/2018   Procedure: INTRAMEDULLARY (IM) NAIL INTERTROCHANTRIC;  Surgeon: Jill FairlyKrasinski, Kevin, MD;  Location: [facility] ORS;  Service: Orthopedics;  Laterality: Left;  Marland Kitchen. VAGINAL HYSTERECTOMY      SOCIAL HISTORY:   Social History   Tobacco Use  . Smoking status: Never Smoker  . Smokeless tobacco: Never Used  Substance Use Topics  . Alcohol use: No    Alcohol/week: 0.0 standard drinks    FAMILY HISTORY:   Family History  Problem Relation Age of Onset  . Heart attack Mother   . Heart disease Mother   . Heart disease Father   . Heart attack Brother        death at age 79  . Stroke Brother        death at 1261  . Bladder Cancer Neg Hx   . Kidney cancer Neg Hx     DRUG ALLERGIES:   Allergies  Allergen Reactions  . Demerol [Meperidine] Nausea And Vomiting  . Macrobid [Nitrofurantoin Macrocrystal] Rash  . Penicillins Rash    Has patient had a PCN reaction causing immediate rash, facial/tongue/throat swelling, SOB or lightheadedness with hypotension: Unknown Has patient had a PCN reaction causing severe rash involving mucus membranes or skin necrosis: Unknown Has patient had a PCN reaction that required hospitalization: Unknown Has patient had a PCN reaction occurring within the last 10 years: No If all of the above answers are "NO", then may proceed with Cephalosporin use.     REVIEW OF SYSTEMS:  Review of Systems  Constitutional: Negative for chills, fever and weight loss.  HENT: Negative for  ear discharge, ear pain and nosebleeds.   Eyes: Negative for blurred vision, pain and discharge.  Respiratory: Negative for sputum production, shortness of breath, wheezing and stridor.   Cardiovascular: Negative for chest pain, palpitations, orthopnea and PND.  Gastrointestinal: Negative for abdominal pain, diarrhea, nausea and vomiting.  Genitourinary: Negative for frequency and urgency.  Musculoskeletal: Positive for joint pain. Negative for back pain.   Neurological: Positive for focal weakness and weakness. Negative for sensory change and speech change.  Psychiatric/Behavioral: Negative for depression and hallucinations. The patient is not nervous/anxious.      MEDICATIONS AT HOME:   Prior to Admission medications   Medication Sig Start Date End Date Taking? Authorizing Provider  acetaminophen (TYLENOL) 500 MG tablet Take 500 mg by mouth 3 (three) times daily.    Yes [provider]  alendronate (FOSAMAX) 70 MG tablet TAKE 1 TABLET EVERY 7 DAYS.  SEE PACKAGE FOR ADDITIONAL INSTRUCTIONS Patient taking differently: Take 70 mg by mouth every Monday.  02/22/18  Yes Karamalegos, Alexander J, DO  amLODipine (NORVASC) 10 MG tablet TAKE 1 TABLET BY MOUTH EVERY DAY Patient taking differently: Take 10 mg by mouth daily.  08/26/18  Yes Karamalegos, Netta NeatAlexander J, DO  aspirin 81 MG tablet Take 1 tablet (81 mg total) by mouth daily. 03/07/13  Yes Ronal FearLam, Lynn E, NP  Cholecalciferol (VITAMIN D3) 125 MCG (5000 UT) CAPS Take 1 capsule (5,000 Units total) by mouth daily. For 12 weeks, then start Vitamin D3 2,000 units daily (OTC) 07/29/18  Yes Karamalegos, Netta NeatAlexander J, DO  Cranberry-Vitamin C (AZO CRANBERRY URINARY TRACT PO) Take 2 tablets by mouth daily. 03/20/18  Yes [provider]  docusate sodium (COLACE) 100 MG capsule Take 1 capsule (100 mg total) by mouth 2 (two) times daily. 11/26/18  Yes Altamese DillingVachhani, Vaibhavkumar, MD  esomeprazole (NEXIUM) 20 MG capsule Take 1 capsule (20 mg total) by mouth daily before breakfast. 12/14/17  Yes Karamalegos, Netta NeatAlexander J, DO  FLUoxetine (PROZAC) 40 MG capsule Take 1 capsule (40 mg total) by mouth daily. 05/28/18  Yes Karamalegos, Netta NeatAlexander J, DO  HYDROcodone-acetaminophen (NORCO/VICODIN) 5-325 MG tablet Take 1-2 tablets by mouth every 6 (six) hours as needed for moderate pain or severe pain (pain score 4-6). 11/26/18  Yes Altamese DillingVachhani, Vaibhavkumar, MD  ipratropium (ATROVENT) 0.06 % nasal spray Place 2 sprays into both  nostrils 4 (four) times daily. For up to 5-7 days then stop. 04/10/18  Yes Karamalegos, Netta NeatAlexander J, DO  lisinopril (PRINIVIL,ZESTRIL) 40 MG tablet Take 1 tablet (40 mg total) by mouth daily. 02/22/18  Yes Karamalegos, Netta NeatAlexander J, DO  Multiple Vitamins-Minerals (MULTIVITAMIN WITH MINERALS) tablet Take 1 tablet by mouth daily.   Yes [provider]  polyethylene glycol (MIRALAX / GLYCOLAX) 17 g packet Take 17 g by mouth daily as needed for mild constipation. 11/26/18  Yes Altamese DillingVachhani, Vaibhavkumar, MD  potassium chloride (K-DUR,KLOR-CON) 10 MEQ tablet Take 1 tablet (10 mEq total) by mouth daily. 05/28/18  Yes Karamalegos, Netta NeatAlexander J, DO  pravastatin (PRAVACHOL) 20 MG tablet Take 1 tablet (20 mg total) by mouth at bedtime. 02/22/18  Yes Karamalegos, Alexander J, DO  enoxaparin (LOVENOX) 40 MG/0.4ML injection Inject 0.4 mLs (40 mg total) into the skin daily. Patient not taking: Reported on 12/17/2018 11/27/18   Altamese DillingVachhani, Vaibhavkumar, MD      VITAL SIGNS:  Blood pressure (!) 145/83, pulse 84, temperature 98.5 F (36.9 C), temperature source Oral, resp. rate 19, height 5\' 2"  (1.575 m), weight 63.5 kg, SpO2 96 %.  PHYSICAL EXAMINATION:  GENERAL:  78 y.o.-year-old patient lying in the bed with no acute distress.  EYES: Pupils equal, round, reactive to light and accommodation. No scleral icterus. Extraocular muscles intact.  HEENT: Head atraumatic, normocephalic. Oropharynx and nasopharynx clear.  NECK:  Supple, no jugular venous distention. No thyroid enlargement, no tenderness.  LUNGS: Normal breath sounds bilaterally, no wheezing, rales,rhonchi or crepitation. No use of accessory muscles of respiration.  CARDIOVASCULAR: S1, S2 normal. No murmurs, rubs, or gallops.  ABDOMEN: Soft, nontender, nondistended. Bowel sounds present. No organomegaly or mass.  EXTREMITIES: No pedal edema, cyanosis, or clubbing. Decreased ROM left LE NEUROLOGIC: Cranial nerves II through XII are intact. Muscle strength  5/5 in all extremities. Sensation intact. Gait not checked. Decreased ROM left LE PSYCHIATRIC: The patient is alert and oriented x 3.  SKIN: No obvious rash, lesion, or ulcer.   LABORATORY PANEL:   CBC Recent Labs  Lab 12/17/18 1352  WBC 8.6  HGB 11.2*  HCT 35.1*  PLT 456*   ------------------------------------------------------------------------------------------------------------------  Chemistries  Recent Labs  Lab 12/17/18 1352  NA 134*  K 3.6  CL 101  CO2 25  GLUCOSE 107*  BUN 12  CREATININE 0.73  CALCIUM 9.0   ------------------------------------------------------------------------------------------------------------------  Cardiac Enzymes Recent Labs  Lab 12/17/18 1718  TROPONINI 0.06*   ------------------------------------------------------------------------------------------------------------------  RADIOLOGY:  Ct Head Wo Contrast  Result Date: 12/17/2018 CLINICAL DATA:  History of stroke EXAM: CT HEAD WITHOUT CONTRAST TECHNIQUE: Contiguous axial images were obtained from the base of the skull through the vertex without intravenous contrast. COMPARISON:  11/22/2018 FINDINGS: Brain: No evidence of acute infarction, hemorrhage, hydrocephalus, extra-axial collection or mass lesion/mass effect. There is extensive, confluent periventricular and deep white matter hypodensity of the bilateral cerebral hemispheres Vascular: No hyperdense vessel or unexpected calcification. Skull: Normal. Negative for fracture or focal lesion. Sinuses/Orbits: No acute finding. Other: None. IMPRESSION: No acute intracranial pathology. There is extensive, confluent periventricular and deep white matter hypodensity of the bilateral cerebral hemispheres, consistent with advanced small-vessel white matter disease and prior infarctions. Consider MRI to more sensitively evaluate for acutely superimposed diffusion restricting infarction in this background. Electronically Signed   By: Lauralyn PrimesAlex  Bibbey  M.D.   On: 12/17/2018 14:48   Mr Brain Wo Contrast  Result Date: 12/17/2018 CLINICAL DATA:  Right lower extremity weakness EXAM: MRI HEAD WITHOUT CONTRAST TECHNIQUE: Multiplanar, multiecho pulse sequences of the brain and surrounding structures were obtained without intravenous contrast. COMPARISON:  Head CT 12/17/2018 FINDINGS: BRAIN: Punctate focus of abnormal diffusion restriction within the left pons. There is also blooming in this area on the T2-weighted gradient echo sequence. No other diffusion abnormality. The midline structures are normal. There are old infarcts of the right cerebellum, right pons and both deep gray nuclei. Diffuse confluent hyperintense T2-weighted signal within the periventricular, deep and juxtacortical white matter, most commonly due to chronic ischemic microangiopathy. The cerebral and cerebellar volume are age-appropriate. No hydrocephalus. Multiple chronic microhemmorhages in a predominantly central distribution. No mass lesion. VASCULAR: The major intracranial arterial and venous sinus flow voids are normal. SKULL AND UPPER CERVICAL SPINE: Calvarial bone marrow signal is normal. There is no skull base mass. Visualized upper cervical spine and soft tissues are normal. SINUSES/ORBITS: No fluid levels or advanced mucosal thickening. No mastoid or middle ear effusion. The orbits are normal. IMPRESSION: 1. Punctate focus of abnormal diffusion restriction within the left pons with associated magnetic susceptibility effect on T2*-weighted imaging. This could represent a small acute infarct, as this location is in keeping with the  reported right lower extremity weakness. However this could also be a susceptibility artifact due to chronic blood products. 2. 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 3. Numerous chronic microhemorrhages, predominantly central, most consistent with chronic hypertensive angiopathy. Electronically Signed   By: Ulyses Jarred M.D.   On: 12/17/2018 17:16   Dg Chest Portable 1 View  Result Date: 12/17/2018 CLINICAL DATA:  Pain EXAM: PORTABLE CHEST 1 VIEW COMPARISON:  11/21/2018 FINDINGS: The heart size is stable. No pneumothorax. No large pleural effusion. There is likely some atelectasis versus scarring at the lung bases. No acute osseous abnormality. IMPRESSION: No active disease. Electronically Signed   By: Constance Holster M.D.   On: 12/17/2018 15:08   Dg Hip Unilat W Or Wo Pelvis 2-3 Views Left  Result Date: 12/17/2018 CLINICAL DATA:  The patient suffered a left intertrochanteric fracture in a fall 11/21/2018 and underwent fixation that same day. Left hip pain today. No new injury. Subsequent encounter. EXAM: DG HIP (WITH OR WITHOUT PELVIS) 2-3V LEFT COMPARISON:  Plain films left hip 11/21/2018. FINDINGS: Hip screw and short intramedullary nail are in place for fixation of a left intertrochanteric fracture. Hardware is intact. Position and alignment are unchanged. A cortical lucency is seen in the anterior and lateral cortex of the proximal diaphysis of the femur consistent with a periprosthetic fracture along the midportion of the intramedullary nail. This is not visible on the prior exam. IMPRESSION: Incomplete, nondisplaced diaphyseal fracture along the midportion of the patient's intramedullary nail is not visible on the immediate postoperative films. Left intertrochanteric fracture without change in position or alignment. Electronically Signed   By: Inge Rise M.D.   On: 12/17/2018 15:10    EKG:    IMPRESSION AND PLAN:  Jill Hancock  is a 78 y.o. female with a known history of depression, anxiety, HTN previous stroke, vertigo and unsteady gait comes to the emergency room from home after she started having right lower extremity weakness. Patient altered right lower extremity pain. She denies any fall or trauma.  1. Right lower extremity with MRI suggestive of left pons like lacunar infarct -admit  to medical floor -neurology consultation placed for M guersch _haiku message sent -continue aspirin, statins -PT OT -ultrasound carotid and echo of the heart  2. Right peri-prosthetic hip fracture -patient recently had surgery 21st may with intramedullary pending of the left hip -Dr. Roland Rack consulted. Aware of patient being admitted -start PT after seen by orthopedic and recommendations given -PRN pain meds--- daughter wants to minimize as much narcotics as possible  3. Chronic anxiety/depression -according to the daughter she was started on 0.5 mg BID PRN Ativan at [facility] recently and she did well with her -will resume it  4. Hypertension continue home meds  5. DVT prophylaxis subcu Lovenox  Hope with daughter Theophilus Kinds updated   All the records are reviewed and case discussed with ED provider.   CODE STATUS: FULL  TOTAL TIME TAKING CARE OF THIS PATIENT: *50** minutes.    Fritzi Mandes M.D on 12/17/2018 at 6:35 PM  Between 7am to 6pm - Pager - [phone]  After 6pm go to [email or web address] - password [email or web address] [facility]  Office  [phone]  CC: Primary care physician; Olin Hauser, DO

## 2018-12-17 NOTE — Progress Notes (Signed)
Family Meeting Note  Advance Directive:yes Today a meeting took place with the daughter Theophilus Kinds on the phone  It has history of chronic anxiety depression recent hip fracture status post intramedullary pending went to [facility] was discharged to home yesterday comes in with right lower extremity weakness and pain found to have possible left acute stroke and peri-prosthetic fracture. Patient is being admitted for further evaluation. Discuss code status with daughter Theophilus Kinds she request full code  I'm spent 16 minutes Fritzi Mandes, MD

## 2018-12-17 NOTE — ED Notes (Signed)
ED TO INPATIENT HANDOFF REPORT  ED Nurse Name and Phone #:  Reuel BoomDaniel [phone]  S Name/Age/Gender Jill JarvisLinda S Hancock 78 y.o. female Room/Bed: ED06A/ED06A  Code Status   Code Status: Prior  Home/SNF/Other Home Patient oriented to: self and place Is this baseline? Yes   Triage Complete: Triage complete  Chief Complaint left hip pain  Triage Note Pt ems from home for hip pain. Per ems pt with hx L hip fx 5/21, fixed [facility] and is having hip pain today. Per ems pt able to stand and pivot to stretcher. Here she was able to lift hips off bed to be changed.   Allergies Allergies  Allergen Reactions  . Demerol [Meperidine] Nausea And Vomiting  . Macrobid [Nitrofurantoin Macrocrystal] Rash  . Penicillins Rash    Has patient had a PCN reaction causing immediate rash, facial/tongue/throat swelling, SOB or lightheadedness with hypotension: Unknown Has patient had a PCN reaction causing severe rash involving mucus membranes or skin necrosis: Unknown Has patient had a PCN reaction that required hospitalization: Unknown Has patient had a PCN reaction occurring within the last 10 years: No If all of the above answers are "NO", then may proceed with Cephalosporin use.     Level of Care/Admitting Diagnosis ED Disposition    ED Disposition Condition Comment   Admit  [facility] [[id number]]  Level of Care: Med-Surg [16]  Covid Evaluation: N/A  Diagnosis: CVA (cerebral vascular accident) [facility](HCC) [[id number]]) [[id number]]  Admitting Physician: Joselyn GlassmanPATEL, SONA [[id number]]  Attending Physician: Joselyn GlassmanPATEL, SONA [[id number]]  Estimated length of stay: past midnight tomorrow  Certification:: I certify this patient will need inpatient services for at least 2 midnights  PT Class (Do Not Modify): Inpatient [101]  PT Acc Code (Do Not Modify): Private [1]       B Medical/Surgery History Past Medical History:  Diagnosis Date  . Anxiety   .  Depression   . Hyperglycemia   . Hypertension   . Hypokalemia   . Osteoporosis   . Stroke [facility](HCC) 02/2012   MRI revealed at least 3 subcentimeter acute infarctions with one area of subacute infarction in widely disparate vascular territories including territory of left cerebellum and around right caudate nucleus along with widespread lacunar infarcts, chronic microvascular ischemic change, and numerous microbleeds suggesting long standing hypertensive cerebrovascular disease.  MRA confirmed intracranial  . Unsteady gait   . Vertigo    Past Surgical History:  Procedure Laterality Date  . BREAST BIOPSY Right    pt not sure when   . CHOLECYSTECTOMY    . INTRAMEDULLARY (IM) NAIL INTERTROCHANTERIC Left 11/21/2018   Procedure: INTRAMEDULLARY (IM) NAIL INTERTROCHANTRIC;  Surgeon: Juanell FairlyKrasinski, Kevin, MD;  Location: [facility] ORS;  Service: Orthopedics;  Laterality: Left;  Marland Kitchen. VAGINAL HYSTERECTOMY       A IV Location/Drains/Wounds Patient Lines/Drains/Airways Status   Active Line/Drains/Airways    Name:   Placement date:   Placement time:   Site:   Days:   Peripheral IV 12/17/18 Right Antecubital   12/17/18    1356    Antecubital   less than 1   External Urinary Catheter   12/17/18    1433    -   less than 1   Incision (Closed) 11/21/18 Hip Left   11/21/18    1021     26          Intake/Output Last 24 hours No intake or output data in the 24  hours ending 12/17/18 1849  Labs/Imaging Results for orders placed or performed during the hospital encounter of 12/17/18 (from the past 48 hour(s))  Basic metabolic panel     Status: Abnormal   Collection Time: 12/17/18  1:52 PM  Result Value Ref Range   Sodium 134 (L) 135 - 145 mmol/L   Potassium 3.6 3.5 - 5.1 mmol/L   Chloride 101 98 - 111 mmol/L   CO2 25 22 - 32 mmol/L   Glucose, Bld 107 (H) 70 - 99 mg/dL   BUN 12 8 - 23 mg/dL   Creatinine, Ser 4.540.73 0.44 - 1.00 mg/dL   Calcium 9.0 8.9 - 09.810.3 mg/dL   GFR calc non Af Amer >60 >60 mL/min   GFR calc  Af Amer >60 >60 mL/min   Anion gap 8 5 - 15    Comment: Performed at [facility], [address] [place], [place] [address]  CBC with Differential     Status: Abnormal   Collection Time: 12/17/18  1:52 PM  Result Value Ref Range   WBC 8.6 4.0 - 10.5 K/uL   RBC 3.93 3.87 - 5.11 MIL/uL   Hemoglobin 11.2 (L) 12.0 - 15.0 g/dL   HCT 78.235.1 (L) 95.636.0 - 21.346.0 %   MCV 89.3 80.0 - 100.0 fL   MCH 28.5 26.0 - 34.0 pg   MCHC 31.9 30.0 - 36.0 g/dL   RDW 08.615.1 57.811.5 - 46.915.5 %   Platelets 456 (H) 150 - 400 K/uL   nRBC 0.0 0.0 - 0.2 %   Neutrophils Relative % 70 %   Neutro Abs 6.1 1.7 - 7.7 K/uL   Lymphocytes Relative 19 %   Lymphs Abs 1.6 0.7 - 4.0 K/uL   Monocytes Relative 8 %   Monocytes Absolute 0.7 0.1 - 1.0 K/uL   Eosinophils Relative 2 %   Eosinophils Absolute 0.2 0.0 - 0.5 K/uL   Basophils Relative 0 %   Basophils Absolute 0.0 0.0 - 0.1 K/uL   Immature Granulocytes 1 %   Abs Immature Granulocytes 0.05 0.00 - 0.07 K/uL    Comment: Performed at [facility], [address] [place], [place] [address]  Troponin I - Once     Status: Abnormal   Collection Time: 12/17/18  1:52 PM  Result Value Ref Range   Troponin I 0.07 (HH) <0.03 ng/mL    Comment: CRITICAL RESULT CALLED TO, READ BACK BY AND VERIFIED WITH Tory EmeraldSHANNON MARTIN RN AT 1427 ON 12/17/18 [facility] Performed at [facility], [address] [place], [place] [address]   Lactic acid, plasma     Status: None   Collection Time: 12/17/18  1:52 PM  Result Value Ref Range   Lactic Acid, Venous 1.3 0.5 - 1.9 mmol/L    Comment: Performed at [facility], [address] [place], [place] [address]  Troponin I - Once-Timed     Status: Abnormal   Collection Time: 12/17/18  5:18 PM  Result Value Ref Range   Troponin I 0.06 (HH) <0.03 ng/mL    Comment: CRITICAL VALUE NOTED. VALUE IS CONSISTENT WITH PREVIOUSLY REPORTED/CALLED VALUE/TFK Performed at [facility], [address] [place], [place]  [address]    Ct Head Wo Contrast  Result Date: 12/17/2018 CLINICAL DATA:  History of stroke EXAM: CT HEAD WITHOUT CONTRAST TECHNIQUE: Contiguous axial images were obtained from the base of the skull through the vertex without intravenous contrast. COMPARISON:  11/22/2018 FINDINGS: Brain: No evidence of acute infarction, hemorrhage, hydrocephalus, extra-axial  collection or mass lesion/mass effect. There is extensive, confluent periventricular and deep white matter hypodensity of the bilateral cerebral hemispheres Vascular: No hyperdense vessel or unexpected calcification. Skull: Normal. Negative for fracture or focal lesion. Sinuses/Orbits: No acute finding. Other: None. IMPRESSION: No acute intracranial pathology. There is extensive, confluent periventricular and deep white matter hypodensity of the bilateral cerebral hemispheres, consistent with advanced small-vessel white matter disease and prior infarctions. Consider MRI to more sensitively evaluate for acutely superimposed diffusion restricting infarction in this background. Electronically Signed   By: Lauralyn PrimesAlex  Bibbey M.D.   On: 12/17/2018 14:48   Mr Brain Wo Contrast  Result Date: 12/17/2018 CLINICAL DATA:  Right lower extremity weakness EXAM: MRI HEAD WITHOUT CONTRAST TECHNIQUE: Multiplanar, multiecho pulse sequences of the brain and surrounding structures were obtained without intravenous contrast. COMPARISON:  Head CT 12/17/2018 FINDINGS: BRAIN: Punctate focus of abnormal diffusion restriction within the left pons. There is also blooming in this area on the T2-weighted gradient echo sequence. No other diffusion abnormality. The midline structures are normal. There are old infarcts of the right cerebellum, right pons and both deep gray nuclei. Diffuse confluent hyperintense T2-weighted signal within the periventricular, deep and juxtacortical white matter, most commonly due to chronic ischemic microangiopathy. The cerebral and cerebellar volume are  age-appropriate. No hydrocephalus. Multiple chronic microhemmorhages in a predominantly central distribution. No mass lesion. VASCULAR: The major intracranial arterial and venous sinus flow voids are normal. SKULL AND UPPER CERVICAL SPINE: Calvarial bone marrow signal is normal. There is no skull base mass. Visualized upper cervical spine and soft tissues are normal. SINUSES/ORBITS: No fluid levels or advanced mucosal thickening. No mastoid or middle ear effusion. The orbits are normal. IMPRESSION: 1. Punctate focus of abnormal diffusion restriction within the left pons with associated magnetic susceptibility effect on T2*-weighted imaging. This could represent a small acute infarct, as this location is in keeping with the reported right lower extremity weakness. However this could also be a susceptibility artifact due to chronic blood products. 2. 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 3. Numerous chronic microhemorrhages, predominantly central, most consistent with chronic hypertensive angiopathy. Electronically Signed   By: Deatra RobinsonKevin  Herman M.D.   On: 12/17/2018 17:16   Dg Chest Portable 1 View  Result Date: 12/17/2018 CLINICAL DATA:  Pain EXAM: PORTABLE CHEST 1 VIEW COMPARISON:  11/21/2018 FINDINGS: The heart size is stable. No pneumothorax. No large pleural effusion. There is likely some atelectasis versus scarring at the lung bases. No acute osseous abnormality. IMPRESSION: No active disease. Electronically Signed   By: Katherine Mantlehristopher  Green M.D.   On: 12/17/2018 15:08   Dg Hip Unilat W Or Wo Pelvis 2-3 Views Left  Result Date: 12/17/2018 CLINICAL DATA:  The patient suffered a left intertrochanteric fracture in a fall 11/21/2018 and underwent fixation that same day. Left hip pain today. No new injury. Subsequent encounter. EXAM: DG HIP (WITH OR WITHOUT PELVIS) 2-3V LEFT COMPARISON:  Plain films left hip 11/21/2018. FINDINGS: Hip screw and short intramedullary nail are  in place for fixation of a left intertrochanteric fracture. Hardware is intact. Position and alignment are unchanged. A cortical lucency is seen in the anterior and lateral cortex of the proximal diaphysis of the femur consistent with a periprosthetic fracture along the midportion of the intramedullary nail. This is not visible on the prior exam. IMPRESSION: Incomplete, nondisplaced diaphyseal fracture along the midportion of the patient's intramedullary nail is not visible on the immediate postoperative films. Left intertrochanteric fracture without change in position  or alignment. Electronically Signed   By: Inge Rise M.D.   On: 12/17/2018 15:10    Pending Labs Unresulted Labs (From admission, onward)    Start     Ordered   12/17/18 1725  Novel Coronavirus,NAA,(SEND-OUT TO REF LAB - TAT 24-48 hrs); Hosp Order  (Symptomatic Patients Labs with Precautions )  Once,   STAT    Question:  Current symptoms  Answer:  Other (testing not indicated)   12/17/18 1724   12/17/18 1341  Urinalysis, Complete w Microscopic  ONCE - STAT,   STAT     12/17/18 1340   Signed and Held  Lipid panel  Add-on,   R    Comments: Fasting    Signed and Held   Signed and Held  CBC  (enoxaparin (LOVENOX)    CrCl >/= 30 ml/min)  Once,   R    Comments: Baseline for enoxaparin therapy IF NOT ALREADY DRAWN.  Notify MD if PLT < 100 K.    Signed and Held   Signed and Held  Creatinine, serum  (enoxaparin (LOVENOX)    CrCl >/= 30 ml/min)  Once,   R    Comments: Baseline for enoxaparin therapy IF NOT ALREADY DRAWN.    Signed and Held   Signed and Held  Creatinine, serum  (enoxaparin (LOVENOX)    CrCl >/= 30 ml/min)  Weekly,   R    Comments: while on enoxaparin therapy    Signed and Held          Vitals/Pain Today's Vitals   12/17/18 1319 12/17/18 1433 12/17/18 1433 12/17/18 1800  BP:  (!) 146/80  (!) 145/83  Pulse:  76  84  Resp:  18  19  Temp:      TempSrc:      SpO2:  97%  96%  Weight: 63.5 kg     Height:  5\' 2"  (7.628 m)     PainSc:   7  0-No pain    Isolation Precautions Droplet and Contact precautions  Medications Medications  ondansetron (ZOFRAN) injection 4 mg (4 mg Intravenous Given 12/17/18 1356)  morphine 4 MG/ML injection 4 mg (4 mg Intravenous Given 12/17/18 1356)    Mobility walks with device High fall risk   Focused Assessments Cardiac Assessment Handoff:    Lab Results  Component Value Date   CKTOTAL 192 (H) 10/07/2013   CKMB 6.0 (H) 02/25/2012   TROPONINI 0.06 ([place]) 12/17/2018   No results found for: DDIMER Does the Patient currently have chest pain? No  , Neuro Assessment Handoff:  Swallow screen pass? Yes          Neuro Assessment: Exceptions to WDL Neuro Checks:      Last Documented NIHSS Modified Score:   Has TPA been given? No If patient is a Neuro Trauma and patient is going to OR before floor call report to [place] nurse: [phone] or [phone]     R Recommendations: See Admitting Provider Note  Report given to:   Additional Notes:

## 2018-12-17 NOTE — ED Notes (Signed)
Pt given drink. Waiting on admit bed; covid results needed prior to transfer to floor.

## 2018-12-17 NOTE — ED Notes (Signed)
RN spoke to pts daughter about reasoning for sending pt to the ED. Family clarified they were concerned about multiple symptoms. PT has been losing ability in her right leg while trying to ambulate which family states is new since being admitted to [facility]. Pt continues to request pain medication for left hip pain. Family verbalized frustration because MD told them pt should be pain free after 3 weeks and it has been 5 weeks. Family also concerned because pt has been more lethargic than normal and pale in her complexion and family concerned for anemia.

## 2018-12-17 NOTE — ED Provider Notes (Signed)
X-rays do show a new left femur fracture that was not present in postop films.  Discussed with Dr. Roland Rack who stated patient should remain nonweightbearing.  Nonsurgical at this time.  MRI of the brain is concerning for acute stroke.  This could also explain the elevated troponin.  Will plan on admission.  Discussed findings and plan with patient.   Nance Pear, MD 12/17/18 1736

## 2018-12-17 NOTE — ED Provider Notes (Signed)
[facility] Emergency Department Provider Note ____________________________________________   First MD Initiated Contact with Patient 12/17/18 1306     (approximate)  I have reviewed the triage vital signs and the nursing notes.   HISTORY  Chief Complaint Hip Pain  Level 5 caveat: History of present illness limited due to dementia  HPI Jill CockerLinda S Dubose is a 78 y.o. female who presents with persistent left hip pain after a recent surgery.  She has had no new trauma or injury.  In addition the family reports that the patient has had some increased weakness in the right leg when trying to ambulate.  They also report the patient appears pale and has been somewhat more lethargic than normal.   Past Medical History:  Diagnosis Date  . Anxiety   . Depression   . Hyperglycemia   . Hypertension   . Hypokalemia   . Osteoporosis   . Stroke [facility](HCC) 02/2012   MRI revealed at least 3 subcentimeter acute infarctions with one area of subacute infarction in widely disparate vascular territories including territory of left cerebellum and around right caudate nucleus along with widespread lacunar infarcts, chronic microvascular ischemic change, and numerous microbleeds suggesting long standing hypertensive cerebrovascular disease.  MRA confirmed intracranial  . Unsteady gait   . Vertigo     Patient Active Problem List   Diagnosis Date Noted  . Closed left hip fracture (HCC) 11/21/2018  . Pre-diabetes 07/25/2018  . Multiple rib fractures 12/31/2017  . Primary osteoarthritis of right knee 12/14/2017  . Dementia, vascular (HCC) 12/04/2017  . Right shoulder injury 11/12/2015  . Urinary urgency 09/17/2015  . Hyperglycemia 07/29/2015  . Restless leg 06/07/2015  . Benign paroxysmal positional vertigo 06/07/2015  . Hyperlipidemia 04/16/2015  . Vitamin D deficiency 08/13/2014  . T12 compression fracture (HCC) 04/28/2014  . 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 Major  depression, recurrent, chronic (HCC) 11/24/2012  . GERD (gastroesophageal reflux disease) 11/24/2012  . Osteoporosis 09/05/2012  . Right hip pain 08/23/2012  . Anxiety 05/04/2012  . Hypertension 02/27/2012  . History of cerebrovascular accident (CVA) with residual deficit 02/25/2012  . Unsteady gait 02/25/2012    Past Surgical History:  Procedure Laterality Date  . BREAST BIOPSY Right    pt not sure when   . CHOLECYSTECTOMY    . INTRAMEDULLARY (IM) NAIL INTERTROCHANTERIC Left 11/21/2018   Procedure: INTRAMEDULLARY (IM) NAIL INTERTROCHANTRIC;  Surgeon: Juanell FairlyKrasinski, Kevin, MD;  Location: [facility] ORS;  Service: Orthopedics;  Laterality: Left;  Marland Kitchen. VAGINAL HYSTERECTOMY      Prior to Admission medications   Medication Sig Start Date End Date Taking? Authorizing Provider  acetaminophen (TYLENOL) 500 MG tablet Take 500 mg by mouth every 6 (six) hours as needed for mild pain or headache.     [provider]  alendronate (FOSAMAX) 70 MG tablet TAKE 1 TABLET EVERY 7 DAYS.  SEE PACKAGE FOR ADDITIONAL INSTRUCTIONS 02/22/18   Smitty CordsKaramalegos, Alexander J, DO  amLODipine (NORVASC) 10 MG tablet TAKE 1 TABLET BY MOUTH EVERY DAY 08/26/18   Althea CharonKaramalegos, Netta NeatAlexander J, DO  aspirin 81 MG tablet Take 1 tablet (81 mg total) by mouth daily. 03/07/13   Ronal FearLam, Lynn E, NP  Cholecalciferol (VITAMIN D3) 125 MCG (5000 UT) CAPS Take 1 capsule (5,000 Units total) by mouth daily. For 12 weeks, then start Vitamin D3 2,000 units daily (OTC) 07/29/18   Karamalegos, Netta NeatAlexander J, DO  Cranberry-Vitamin C (AZO CRANBERRY URINARY TRACT PO) Take 2 tablets by mouth daily. 03/20/18   [provider]  docusate sodium (COLACE) 100 MG capsule Take 1 capsule (100 mg total) by mouth 2 (two) times daily. 11/26/18   Altamese DillingVachhani, Vaibhavkumar, MD  enoxaparin (LOVENOX) 40 MG/0.4ML injection Inject 0.4 mLs (40 mg total) into the skin daily. 11/27/18   Altamese DillingVachhani, Vaibhavkumar, MD  esomeprazole (NEXIUM) 20 MG capsule Take 1 capsule (20 mg total) by  mouth daily before breakfast. 12/14/17   Althea CharonKaramalegos, Netta NeatAlexander J, DO  FLUoxetine (PROZAC) 40 MG capsule Take 1 capsule (40 mg total) by mouth daily. 05/28/18   Karamalegos, Netta NeatAlexander J, DO  HYDROcodone-acetaminophen (NORCO/VICODIN) 5-325 MG tablet Take 1-2 tablets by mouth every 6 (six) hours as needed for moderate pain or severe pain (pain score 4-6). 11/26/18   Altamese DillingVachhani, Vaibhavkumar, MD  ipratropium (ATROVENT) 0.06 % nasal spray Place 2 sprays into both nostrils 4 (four) times daily. For up to 5-7 days then stop. 04/10/18   Karamalegos, Netta NeatAlexander J, DO  lisinopril (PRINIVIL,ZESTRIL) 40 MG tablet Take 1 tablet (40 mg total) by mouth daily. 02/22/18   Karamalegos, Netta NeatAlexander J, DO  polyethylene glycol (MIRALAX / GLYCOLAX) 17 g packet Take 17 g by mouth daily as needed for mild constipation. 11/26/18   Altamese DillingVachhani, Vaibhavkumar, MD  potassium chloride (K-DUR,KLOR-CON) 10 MEQ tablet Take 1 tablet (10 mEq total) by mouth daily. 05/28/18   Karamalegos, Netta NeatAlexander J, DO  pravastatin (PRAVACHOL) 20 MG tablet Take 1 tablet (20 mg total) by mouth at bedtime. 02/22/18   Smitty CordsKaramalegos, Alexander J, DO    Allergies Demerol [meperidine], Macrobid [nitrofurantoin macrocrystal], and Penicillins  Family History  Problem Relation Age of Onset  . Heart attack Mother   . Heart disease Mother   . Heart disease Father   . Heart attack Brother        death at age 79  . Stroke Brother        death at 6161  . Bladder Cancer Neg Hx   . Kidney cancer Neg Hx     Social History Social History   Tobacco Use  . Smoking status: Never Smoker  . Smokeless tobacco: Never Used  Substance Use Topics  . Alcohol use: No    Alcohol/week: 0.0 standard drinks  . Drug use: No    Review of Systems Level 5 caveat: Review of systems limited due to dementia Constitutional: No fever. Cardiovascular: Denies chest pain. Respiratory: Denies shortness of breath. Gastrointestinal: No vomiting. Musculoskeletal: Positive for left hip  pain. Skin: Negative for rash. Neurological: Negative for headache.   ____________________________________________   PHYSICAL EXAM:  VITAL SIGNS: ED Triage Vitals  Enc Vitals Group     BP 12/17/18 1318 134/90     Pulse Rate 12/17/18 1318 90     Resp --      Temp 12/17/18 1318 98.5 F (36.9 C)     Temp Source 12/17/18 1318 Oral     SpO2 12/17/18 1318 98 %     Weight 12/17/18 1319 140 lb (63.5 kg)     Height 12/17/18 1319 5\' 2"  (1.575 m)     Head Circumference --      [facility] Flow --      Pain Score 12/17/18 1318 9     Pain Loc --      Pain Edu? --      Excl. in GC? --     Constitutional: Alert.  Frail and weak appearing but in no acute distress. Eyes: Conjunctivae are normal.  EOMI.  PERRLA. Head: Atraumatic.  No facial droop. Nose: No congestion/rhinnorhea. Mouth/Throat: Mucous membranes are  slightly dry.   Neck: Normal range of motion.  Cardiovascular: Normal rate, regular rhythm. Grossly normal heart sounds.  Good peripheral circulation. Respiratory: Normal respiratory effort.  No retractions. Lungs CTAB. Gastrointestinal: Soft and nontender. No distention.  Genitourinary: No flank tenderness. Musculoskeletal: No lower extremity edema.  Extremities warm and well perfused.  Good active range of motion to the left hip. Neurologic:  Normal speech and language.  5/5 motor intact in all extremities. Skin:  Skin is warm and dry. No rash noted.  Left hip surgical wound healing well and clean/dry/intact with no surrounding erythema, abnormal warmth, or induration.  No drainage. Psychiatric: Mood and affect are normal. Speech and behavior are normal.  ____________________________________________   LABS (all labs ordered are listed, but only abnormal results are displayed)  Labs Reviewed  BASIC METABOLIC PANEL - Abnormal; Notable for the following components:      Result Value   Sodium 134 (*)    Glucose, Bld 107 (*)    All other components within normal limits  CBC WITH  DIFFERENTIAL/PLATELET - Abnormal; Notable for the following components:   Hemoglobin 11.2 (*)    HCT 35.1 (*)    Platelets 456 (*)    All other components within normal limits  TROPONIN I - Abnormal; Notable for the following components:   Troponin I 0.07 (*)    All other components within normal limits  LACTIC ACID, PLASMA  LACTIC ACID, PLASMA  URINALYSIS, COMPLETE (UACMP) WITH MICROSCOPIC   ____________________________________________  EKG  ED ECG REPORT I, Arta Silence, the attending physician, personally viewed and interpreted this ECG.  Date: 12/17/2018 EKG Time: 1343 Rate: 77 Rhythm: normal sinus rhythm QRS Axis: normal Intervals: normal ST/T Wave abnormalities: normal Narrative Interpretation: no evidence of acute ischemia  ____________________________________________  RADIOLOGY  CT head: Multiple chronic findings, unable to rule out acute stroke CXR: Pending XR L hip: Pending  ____________________________________________   PROCEDURES  Procedure(s) performed: No  Procedures  Critical Care performed: No ____________________________________________   INITIAL IMPRESSION / ASSESSMENT AND PLAN / ED COURSE  Pertinent labs & imaging results that were available during my care of the patient were reviewed by me and considered in my medical decision making (see chart for details).  78 year old female status post recent ORIF of the left hip with an intramedullary nail in May presents with persistent left hip pain which has worsened over the last few days, but is not associated with any new injury.  The family also reports some increased weakness on the right side and generalized fatigue and pallor.  The patient was discharged from [facility] yesterday.  I reviewed the past medical records in Epic and confirmed the recent surgical history.  The patient was discharged from the hospital on 11/26/2018.  She had no complications related to the surgery so far.   On exam the patient is uncomfortable but not acutely ill-appearing.  Her vital signs are normal.  She is afebrile.  She has good range of motion of the left hip and the surgical site appears to be healing well.  I cannot elicit any focal neurologic deficits.  Overall at this time there is no evidence to specifically suggest postoperative complication.  However differential would also include new fracture or hardware movement.  Since the patient is moving relatively well and has no fever or infectious symptoms, there is no evidence for septic arthritis.  The differential for the more generalized symptoms is broad.  I do not see any focal neurologic deficits that  would suggest acute stroke, but the differential for increased generalized weakness would include cardiac etiology, dehydration or other metabolic cause, anemia, or infection.  We will obtain CT head, chest x-ray, left hip x-ray, lab work-up, and reassess.  ----------------------------------------- 3:08 PM on 12/17/2018 -----------------------------------------  The patient's troponin is minimally elevated.  This is of unclear significance.  She has no chest pain or any EKG changes.  We will plan for a repeat in a few hours.  CT shows multiple chronic findings and cannot rule out superimposed acute infarct so I have ordered an MRI.  I am signing the patient out to the oncoming physician Dr. Derrill KayGoodman.  If her work-up is otherwise negative for acute findings it is possible that she may be discharged home.  _______________________________  Jill Hancock was evaluated in Emergency Department on 12/17/2018 for the symptoms described in the history of present illness. She was evaluated in the context of the global COVID-19 pandemic, which necessitated consideration that the patient might be at risk for infection with the SARS-CoV-2 virus that causes COVID-19. Institutional protocols and algorithms that pertain to the evaluation of patients at risk  for COVID-19 are in a state of rapid change based on information released by regulatory bodies including the CDC and federal and state organizations. These policies and algorithms were followed during the patient's care in the ED. ____________________________________________   FINAL CLINICAL IMPRESSION(S) / ED DIAGNOSES  Final diagnoses:  Left hip pain      NEW MEDICATIONS STARTED DURING THIS VISIT:  New Prescriptions   No medications on file     Note:  This document was prepared using Dragon voice recognition software and may include unintentional dictation errors.    Dionne BucySiadecki, Laray Corbit, MD 12/17/18 [phone]

## 2018-12-17 NOTE — ED Notes (Signed)
Patient transported to CT 

## 2018-12-17 NOTE — ED Notes (Signed)
MD at bedside. 

## 2018-12-17 NOTE — ED Triage Notes (Signed)
Pt ems from home for hip pain. Per ems pt with hx L hip fx 5/21, fixed and [facility]. [facility] and is having hip pain today. Per ems pt able to stand and pivot to stretcher. Here she was able to lift hips off bed to be changed.

## 2018-12-18 ENCOUNTER — Inpatient Hospital Stay (HOSPITAL_COMMUNITY)
Admit: 2018-12-18 | Discharge: 2018-12-18 | Disposition: A | Discharge status: Home / Self Care | Payer: Medicare HMO | Attending: Internal Medicine | Admitting: Internal Medicine

## 2018-12-18 ENCOUNTER — Other Ambulatory Visit: Payer: Self-pay | Admitting: Family Medicine

## 2018-12-18 ENCOUNTER — Inpatient Hospital Stay: Payer: Medicare HMO

## 2018-12-18 DIAGNOSIS — K219 Gastro-esophageal reflux disease without esophagitis: Secondary | ICD-10-CM

## 2018-12-18 DIAGNOSIS — I639 Cerebral infarction, unspecified: Secondary | ICD-10-CM

## 2018-12-18 LAB — URINALYSIS, COMPLETE (UACMP) WITH MICROSCOPIC
Bilirubin Urine: NEGATIVE
Glucose, UA: NEGATIVE mg/dL
Hgb urine dipstick: NEGATIVE
Ketones, ur: NEGATIVE mg/dL
Leukocytes,Ua: NEGATIVE
Nitrite: NEGATIVE
Protein, ur: NEGATIVE mg/dL
Specific Gravity, Urine: 1.008 (ref 1.005–1.030)
pH: 6 (ref 5.0–8.0)

## 2018-12-18 LAB — ECHOCARDIOGRAM COMPLETE
Height: 62 in
Weight: 2464 oz

## 2018-12-18 MED ORDER — ENSURE ENLIVE PO LIQD
237.0000 mL | Freq: Two times a day (BID) | ORAL | Status: DC
  Administered 2018-12-19 – 2018-12-23 (×8): 237 mL via ORAL

## 2018-12-18 MED ORDER — MORPHINE SULFATE (PF) 4 MG/ML IV SOLN
4.0000 mg | INTRAVENOUS | Status: DC | PRN
  Administered 2018-12-18 (×2): 4 mg via INTRAVENOUS
  Filled 2018-12-18 (×2): qty 1

## 2018-12-18 MED ORDER — TRAMADOL HCL 50 MG PO TABS
50.0000 mg | ORAL_TABLET | Freq: Four times a day (QID) | ORAL | Status: DC | PRN
  Administered 2018-12-18 – 2018-12-22 (×11): 50 mg via ORAL
  Filled 2018-12-18 (×12): qty 1

## 2018-12-18 MED ORDER — MORPHINE SULFATE (PF) 2 MG/ML IV SOLN
2.0000 mg | INTRAVENOUS | Status: DC | PRN
  Administered 2018-12-18 – 2018-12-21 (×8): 2 mg via INTRAVENOUS
  Filled 2018-12-18 (×8): qty 1

## 2018-12-18 MED ORDER — ATORVASTATIN CALCIUM 20 MG PO TABS
40.0000 mg | ORAL_TABLET | Freq: Every day | ORAL | Status: DC
  Administered 2018-12-18 – 2018-12-22 (×5): 40 mg via ORAL
  Filled 2018-12-18 (×5): qty 2

## 2018-12-18 NOTE — Progress Notes (Signed)
PT Cancellation Note  Patient Details Name: Jill Hancock MRN: [id number] DOB: 05/01/1941   Cancelled Treatment:    Reason Eval/Treat Not Completed: Patient at procedure or test/unavailable(Consult received and chart reviewed.  Patient remains off unit for diagnostic imaging.  Will re-attempt at later time/date as medically appropriate and available.)   Raghav Verrilli H. Owens Shark, PT, DPT, NCS 12/18/18, 11:00 AM [phone]

## 2018-12-18 NOTE — TOC Initial Note (Signed)
Transition of Care [facility]) - Initial/Assessment Note    Patient Details  Name: Jill Hancock MRN: [id number] Date of Birth: 1941-06-09  Transition of Care [facility]) CM/SW Contact:    Shelbie Hutching, RN Phone Number: 12/18/2018, 4:50 PM  Clinical Narrative:                 Patient admitted with hip pain.  Patient recently discharged from [facility] after 21 days.  Daughters report that patient is in her copay days and they cannot afford for her to stay at rehab.  Patient was set up with [facility] before leaving [facility] but the daughters do not think that home health will be enough.  Patient does not qualify for Medicaid she brings in too much money.  RNCM discussed private pay companies for personal care services and the family reports they cannot afford that either.  RNCM will cont to follow patient and refer to PT recommendations.  RNCM also discussed ALF as an option as well but family cannot afford that either.    Expected Discharge Plan: Skilled Nursing Facility Barriers to Discharge: Continued Medical Work up   Patient Goals and CMS Choice Patient states their goals for this hospitalization and ongoing recovery are:: Daughter's verbalize need for help, they report that they cannot care for their mother at home      Expected Discharge Plan and Services Expected Discharge Plan: [place]   Discharge Planning Services: CM Consult Post Acute Care Choice: Home Health   Expected Discharge Date: 12/20/18                         HH Arranged: RN, PT, OT HH Agency: [facility] ([facility]) Date HH Agency Contacted: 12/17/18 Time HH Agency Contacted: 1621 Representative spoke with at [place]: Floydene Flock  Prior Living Arrangements/Services   Lives with:: Self Patient language and need for interpreter reviewed:: No        Need for Family Participation in Patient Care: Yes (Comment) Care giver support system in place?: Yes  (comment)(daughters) Current home services: Home OT, Home PT, Home RN Criminal Activity/Legal Involvement Pertinent to Current Situation/Hospitalization: No - Comment as needed  Activities of Daily Living Home Assistive Devices/Equipment: Environmental consultant (specify type) ADL Screening (condition at time of admission) Patient's cognitive ability adequate to safely complete daily activities?: Yes Is the patient deaf or have difficulty hearing?: No Does the patient have difficulty seeing, even when wearing glasses/contacts?: No Does the patient have difficulty concentrating, remembering, or making decisions?: Yes Patient able to express need for assistance with ADLs?: Yes Does the patient have difficulty dressing or bathing?: Yes Independently performs ADLs?: No Communication: Needs assistance Is this a change from baseline?: Pre-admission baseline Dressing (OT): Needs assistance Is this a change from baseline?: Pre-admission baseline Grooming: Needs assistance Is this a change from baseline?: Pre-admission baseline Feeding: Independent Bathing: Needs assistance Is this a change from baseline?: Pre-admission baseline Toileting: Independent with device (comment) In/Out Bed: Needs assistance Is this a change from baseline?: Pre-admission baseline Walks in Home: Independent with device (comment) Does the patient have difficulty walking or climbing stairs?: Yes Weakness of Legs: Left Weakness of Arms/Hands: None  Permission Sought/Granted                  Emotional Assessment Appearance:: Appears stated age       Alcohol / Substance Use: Not Applicable Psych Involvement: No (comment)  Admission diagnosis:  Elevated troponin [  R79.89] Left hip pain [M25.552] Cerebrovascular accident (CVA), unspecified mechanism (HCC) [I63.9] Patient Active Problem List   Diagnosis Date Noted  . CVA (cerebral vascular accident) (HCC) 12/17/2018  . Closed left hip fracture (HCC) 11/21/2018  .  Pre-diabetes 07/25/2018  . Multiple rib fractures 12/31/2017  . Primary osteoarthritis of right knee 12/14/2017  . Dementia, vascular (HCC) 12/04/2017  . Right shoulder injury 11/12/2015  . Urinary urgency 09/17/2015  . Hyperglycemia 07/29/2015  . Restless leg 06/07/2015  . Benign paroxysmal positional vertigo 06/07/2015  . Hyperlipidemia 04/16/2015  . Vitamin D deficiency 08/13/2014  . T12 compression fracture (HCC) 04/28/2014  . 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 Major depression, recurrent, chronic (HCC) 11/24/2012  . GERD (gastroesophageal reflux disease) 11/24/2012  . Osteoporosis 09/05/2012  . Right hip pain 08/23/2012  . Anxiety 05/04/2012  . Hypertension 02/27/2012  . History of cerebrovascular accident (CVA) with residual deficit 02/25/2012  . Unsteady gait 02/25/2012   PCP:  Smitty CordsKaramalegos, Alexander J, DO Pharmacy:   [id number] [place] [place] - [address] [place] [id number] Phone: [phone] Fax: [phone]     Social Determinants of Health (SDOH) Interventions    Readmission Risk Interventions No flowsheet data found.

## 2018-12-18 NOTE — Evaluation (Signed)
Occupational Therapy Evaluation Patient Details Name: Jill Hancock MRN: [id number] DOB: 1940-11-17 Today's Date: 12/18/2018    History of Present Illness Pt. is a 78 y.o. female who was admitted to [facility] with a CVA, vertigo, and unstaedy gait. Imaging revelaed Acute Lacunar Infarcts in the left cerebellum, Acute Focus of Abnormal diffusion, restriction in the left pons. Pt. also has a new periprosthetic incomplete Hip Fx s/p ORIF IM nailing of the left intertrochanteric Hip Fx. Pt. had been in SNF at [facility] for therapy following her initial hip fracture, and was admitted to the hospital 1 day after discharging home from the SNF.   Clinical Impression   Pt. Presents with weakness, pain, confusion, limited activity tolerance, and limited functional mobility which hinder her ability to complete basic ADL and IADL functioning. Pt. Resides at home alone in an apartment. Pt. Has a neighbor friend who checks in on the pt. Pt.'s daughter assists with bathing, and dressing. Shower transfers using a transfer tub bench. Pt. received meal on wheels. Pt.'s daughter assist with setting up medications using a medication lockbox with an alarm. SPoke with daughter over the phone who verified PLOF. Pt. Could benefit from OT services for ADL training, A/E training, and pt. Education about home modification, and DME. Pt. Will benefit from SNF level of care upon discharge with follow-up OT services to improve function, promote an increased level of independence with daily ADLs, and decrease overall caregiver burden.    Follow Up Recommendations  SNF    Equipment Recommendations       Recommendations for Other Services       Precautions / Restrictions Precautions Precautions: Fall Restrictions Weight Bearing Restrictions: Yes LLE Weight Bearing: Partial weight bearing      Mobility Bed Mobility                  Transfers                 General transfer comment: Deferred     Balance                                           ADL either performed or assessed with clinical judgement   ADL Overall ADL's : Needs assistance/impaired Eating/Feeding: Set up;Supervision/ safety   Grooming: Set up;Minimal assistance   Upper Body Bathing: Set up;Minimal assistance   Lower Body Bathing: Set up;Maximal assistance   Upper Body Dressing : Set up;Minimal assistance   Lower Body Dressing: Set up;Maximal assistance                       Vision Patient Visual Report: No change from baseline       Perception     Praxis      Pertinent Vitals/Pain Pain Assessment: No/denies pain     Hand Dominance Right   Extremity/Trunk Assessment Upper Extremity Assessment Upper Extremity Assessment: Generalized weakness           Communication Communication Communication: Expressive difficulties   Cognition Arousal/Alertness: Awake/alert Behavior During Therapy: Flat affect Overall Cognitive Status: Impaired/Different from baseline                                     General Comments       Exercises     Shoulder  Instructions      Home Living Family/patient expects to be discharged to:: Skilled nursing facility Living Arrangements: Alone Available Help at Discharge: Friend(s);Family Type of Home: Apartment Home Access: Level entry     Home Layout: One level     Bathroom Shower/Tub: Engineer, productionTub/shower unit     Bathroom Accessibility: Yes   Home Equipment: Environmental consultantWalker - 2 wheels;Shower seat          Prior Functioning/Environment Level of Independence: Needs assistance    ADL's / Homemaking Assistance Needed: Per daughter report: daughter assists with bathing, dressing, medication management, meals on wheels. A neighbor friend checked on the pt. often.            OT Problem List: Decreased strength;Decreased range of motion;Decreased knowledge of use of DME or AE;Decreased cognition;Decreased activity  tolerance;Impaired balance (sitting and/or standing);Decreased safety awareness;Pain      OT Treatment/Interventions: Self-care/ADL training;Neuromuscular education;Therapeutic exercise;Therapeutic activities;Patient/family education;DME and/or AE instruction    OT Goals(Current goals can be found in the care plan section)    OT Frequency: Min 2X/week   Barriers to D/C:            Co-evaluation              AM-PAC OT "6 Clicks" Daily Activity     Outcome Measure Help from another person eating meals?: A Little Help from another person taking care of personal grooming?: A Little Help from another person toileting, which includes using toliet, bedpan, or urinal?: A Lot Help from another person bathing (including washing, rinsing, drying)?: A Lot Help from another person to put on and taking off regular upper body clothing?: A Little Help from another person to put on and taking off regular lower body clothing?: A Lot 6 Click Score: 15   End of Session    Activity Tolerance: Patient tolerated treatment well Patient left: in bed;with bed alarm set;with call bell/phone within reach  OT Visit Diagnosis: Muscle weakness (generalized) (M62.81);History of falling (Z91.81)                Time: [id number] OT Time Calculation (min): 24 min Charges:  OT General Charges $OT Visit: 1 Visit OT Evaluation $OT Eval Low Complexity: 1 Low  Olegario MessierElaine Dalynn Jhaveri, MS, OTR/L   Olegario Messierlaine Meloney Feld 12/18/2018, 3:22 PM

## 2018-12-18 NOTE — Progress Notes (Signed)
[facility] [place] [facility]   PATIENT NAME: Jill Hancock    MR#:  [id number]  DATE OF BIRTH:  [birth year]  SUBJECTIVE:  CHIEF COMPLAINT:   Chief Complaint  Patient presents with   Hip Pain   -Alert and oriented x1-2, complaints of left hip pain.  Some swelling of the upper thigh noted  REVIEW OF SYSTEMS:  Review of Systems  Constitutional: Positive for malaise/fatigue. Negative for chills and fever.  HENT: Negative for ear discharge, hearing loss and nosebleeds.   Eyes: Negative for blurred vision and double vision.  Respiratory: Negative for cough, shortness of breath and wheezing.   Cardiovascular: Positive for leg swelling. Negative for chest pain and palpitations.  Gastrointestinal: Negative for abdominal pain, constipation, diarrhea, nausea and vomiting.  Genitourinary: Negative for dysuria.  Musculoskeletal: Positive for joint pain and myalgias.  Neurological: Negative for dizziness, focal weakness, seizures, weakness and headaches.  Psychiatric/Behavioral: Negative for depression.    DRUG ALLERGIES:   Allergies  Allergen Reactions   Demerol [Meperidine] Nausea And Vomiting   Macrobid [Nitrofurantoin Macrocrystal] Rash   Penicillins Rash    Has patient had a PCN reaction causing immediate rash, facial/tongue/throat swelling, SOB or lightheadedness with hypotension: Unknown Has patient had a PCN reaction causing severe rash involving mucus membranes or skin necrosis: Unknown Has patient had a PCN reaction that required hospitalization: Unknown Has patient had a PCN reaction occurring within the last 10 years: No If all of the above answers are "NO", then may proceed with Cephalosporin use.     VITALS:  Blood pressure 134/71, pulse 73, temperature 97.6 F (36.4 C), temperature source Oral, resp. rate 18, height 5\' 2"  (1.575 m), weight 69.9 kg, SpO2 93 %.  PHYSICAL EXAMINATION:  Physical Exam   GENERAL:  78 y.o.-year-old patient  lying in the bed with no acute distress.  EYES: Pupils equal, round, reactive to light and accommodation. No scleral icterus. Extraocular muscles intact.  HEENT: Head atraumatic, normocephalic. Oropharynx and nasopharynx clear.  NECK:  Supple, no jugular venous distention. No thyroid enlargement, no tenderness.  LUNGS: Normal breath sounds bilaterally, no wheezing, rales,rhonchi or crepitation. No use of accessory muscles of respiration. Decreased bibasilar breath sounds CARDIOVASCULAR: S1, S2 normal. No  rubs, or gallops. 2/6 systolic murmur present ABDOMEN: Soft, nontender, nondistended. Bowel sounds present. No organomegaly or mass.  EXTREMITIES: No pedal edema, cyanosis, or clubbing.  NEUROLOGIC: Cranial nerves II through XII are intact. Muscle strength equal in both upper extremities.  Much weaker in both lower extremities especially left leg due to pain.  Sensation intact. Gait not checked.  PSYCHIATRIC: The patient is alert and oriented x 1-2 SKIN: No obvious rash, lesion, or ulcer.    LABORATORY PANEL:   CBC Recent Labs  Lab 12/17/18 1352  WBC 8.6  HGB 11.2*  HCT 35.1*  PLT 456*   ------------------------------------------------------------------------------------------------------------------  Chemistries  Recent Labs  Lab 12/17/18 1352  NA 134*  K 3.6  CL 101  CO2 25  GLUCOSE 107*  BUN 12  CREATININE 0.73  CALCIUM 9.0   ------------------------------------------------------------------------------------------------------------------  Cardiac Enzymes Recent Labs  Lab 12/17/18 1718  TROPONINI 0.06*   ------------------------------------------------------------------------------------------------------------------  RADIOLOGY:  Ct Head Wo Contrast  Result Date: 12/17/2018 CLINICAL DATA:  History of stroke EXAM: CT HEAD WITHOUT CONTRAST TECHNIQUE: Contiguous axial images were obtained from the base of the skull through the vertex without intravenous contrast.  COMPARISON:  11/22/2018 FINDINGS: Brain: No evidence of acute infarction, hemorrhage, hydrocephalus, extra-axial collection or  mass lesion/mass effect. There is extensive, confluent periventricular and deep white matter hypodensity of the bilateral cerebral hemispheres Vascular: No hyperdense vessel or unexpected calcification. Skull: Normal. Negative for fracture or focal lesion. Sinuses/Orbits: No acute finding. Other: None. IMPRESSION: No acute intracranial pathology. There is extensive, confluent periventricular and deep white matter hypodensity of the bilateral cerebral hemispheres, consistent with advanced small-vessel white matter disease and prior infarctions. Consider MRI to more sensitively evaluate for acutely superimposed diffusion restricting infarction in this background. Electronically Signed   By: Eddie Candle M.D.   On: 12/17/2018 14:48   Mr Brain Wo Contrast  Result Date: 12/17/2018 CLINICAL DATA:  Right lower extremity weakness EXAM: MRI HEAD WITHOUT CONTRAST TECHNIQUE: Multiplanar, multiecho pulse sequences of the brain and surrounding structures were obtained without intravenous contrast. COMPARISON:  Head CT 12/17/2018 FINDINGS: BRAIN: Punctate focus of abnormal diffusion restriction within the left pons. There is also blooming in this area on the T2-weighted gradient echo sequence. No other diffusion abnormality. The midline structures are normal. There are old infarcts of the right cerebellum, right pons and both deep gray nuclei. Diffuse confluent hyperintense T2-weighted signal within the periventricular, deep and juxtacortical white matter, most commonly due to chronic ischemic microangiopathy. The cerebral and cerebellar volume are age-appropriate. No hydrocephalus. Multiple chronic microhemmorhages in a predominantly central distribution. No mass lesion. VASCULAR: The major intracranial arterial and venous sinus flow voids are normal. SKULL AND UPPER CERVICAL SPINE: Calvarial bone  marrow signal is normal. There is no skull base mass. Visualized upper cervical spine and soft tissues are normal. SINUSES/ORBITS: No fluid levels or advanced mucosal thickening. No mastoid or middle ear effusion. The orbits are normal. IMPRESSION: 1. Punctate focus of abnormal diffusion restriction within the left pons with associated magnetic susceptibility effect on T2*-weighted imaging. This could represent a small acute infarct, as this location is in keeping with the reported right lower extremity weakness. However this could also be a susceptibility artifact due to chronic blood products. 2. 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 3. Numerous chronic microhemorrhages, predominantly central, most consistent with chronic hypertensive angiopathy. Electronically Signed   By: Ulyses Jarred M.D.   On: 12/17/2018 17:16   US Carotid Bilateral (at [facility] Only)  Result Date: 12/18/2018 CLINICAL DATA:  Hypertension, syncope, hyperlipidemia EXAM: BILATERAL CAROTID DUPLEX ULTRASOUND TECHNIQUE: Pearline Cables scale imaging, color Doppler and duplex ultrasound were performed of bilateral carotid and vertebral arteries in the neck. COMPARISON:  None. FINDINGS: Criteria: Quantification of carotid stenosis is based on velocity parameters that correlate the residual internal carotid diameter with NASCET-based stenosis levels, using the diameter of the distal internal carotid lumen as the denominator for stenosis measurement. The following velocity measurements were obtained: RIGHT ICA: 47/14 cm/sec CCA: 69/48 cm/sec SYSTOLIC ICA/CCA RATIO:  1.0 ECA: 109 cm/sec LEFT ICA: 57/19 cm/sec CCA: 54/62 cm/sec SYSTOLIC ICA/CCA RATIO:  0.8 ECA: 71 cm/sec RIGHT CAROTID ARTERY: Minor echogenic shadowing plaque formation. No hemodynamically significant right ICA stenosis, velocity elevation, or turbulent flow. Degree of narrowing less than 50%. RIGHT VERTEBRAL ARTERY:  Antegrade LEFT CAROTID ARTERY:  Similar scattered minor echogenic plaque formation. No hemodynamically significant left ICA stenosis, velocity elevation, or turbulent flow. LEFT VERTEBRAL ARTERY:  Antegrade IMPRESSION: Minor carotid atherosclerosis. No hemodynamically significant ICA stenosis. Degree of narrowing less than 50% bilaterally by ultrasound criteria. Patent antegrade vertebral flow bilaterally Electronically Signed   By: Jerilynn Mages.  Shick M.D.   On: 12/18/2018 11:07   Dg Chest Portable 1 View  Result Date: 12/17/2018 CLINICAL DATA:  Pain EXAM: PORTABLE CHEST 1 VIEW COMPARISON:  11/21/2018 FINDINGS: The heart size is stable. No pneumothorax. No large pleural effusion. There is likely some atelectasis versus scarring at the lung bases. No acute osseous abnormality. IMPRESSION: No active disease. Electronically Signed   By: Katherine Mantlehristopher  Green M.D.   On: 12/17/2018 15:08   Dg Hip Unilat W Or Wo Pelvis 2-3 Views Left  Result Date: 12/17/2018 CLINICAL DATA:  The patient suffered a left intertrochanteric fracture in a fall 11/21/2018 and underwent fixation that same day. Left hip pain today. No new injury. Subsequent encounter. EXAM: DG HIP (WITH OR WITHOUT PELVIS) 2-3V LEFT COMPARISON:  Plain films left hip 11/21/2018. FINDINGS: Hip screw and short intramedullary nail are in place for fixation of a left intertrochanteric fracture. Hardware is intact. Position and alignment are unchanged. A cortical lucency is seen in the anterior and lateral cortex of the proximal diaphysis of the femur consistent with a periprosthetic fracture along the midportion of the intramedullary nail. This is not visible on the prior exam. IMPRESSION: Incomplete, nondisplaced diaphyseal fracture along the midportion of the patient's intramedullary nail is not visible on the immediate postoperative films. Left intertrochanteric fracture without change in position or alignment. Electronically Signed   By: Drusilla Kannerhomas  Dalessio M.D.   On: 12/17/2018 15:10    EKG:    Orders placed or performed during the hospital encounter of 12/17/18   ED EKG   ED EKG   EKG 12-Lead   EKG 12-Lead    ASSESSMENT AND PLAN:   78 year old female with depression and anxiety, cognitive deficit, prior stroke, vertigo recent fall and left hip fracture was brought in from home secondary to weakness and confusion.  1.  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  Patient admitted with right leg weakness.  However due to her confusion we will do confirmed that now. -Prior infarction noted, and significant microvascular ischemic disease changes.  Concern for underlying vascular dementia as well. -Continue aspirin -Appreciate neuro consult.  Carotid Dopplers and echo are pending -PT and OT consults recommended  2.  Left periprosthetic hip fracture-patient was here 5 weeks ago for left hip intertrochanteric fracture and had intramedullary nail placement by Ortho.  Was discharged to rehab facility.  Family denies any further falls. -Patient now complaining of left leg pain -Repeat x-rays now showing a new incomplete subtrochanteric hip fracture of the left hip. -Ortho reconsulted.  Nonsurgical management at this time.  Continue pain control and physical therapy.  3.  Hypertension-on Norvasc, lisinopril  4.  DVT prophylaxis-Lovenox   All the records are reviewed and case discussed with Care Management/Social Workerr. Management plans discussed with the patient, family and they are in agreement.  CODE STATUS: Full Code  TOTAL TIME TAKING CARE OF THIS PATIENT: 38 minutes.   POSSIBLE D/C IN 2 DAYS, DEPENDING ON CLINICAL CONDITION.   Enid Baasadhika Delana Manganello M.D on 12/18/2018 at 12:43 PM  Between 7am to 6pm - Pager - [phone]  After 6pm go to [email or web address] - Social research officer, governmentpassword [email or web address] [email or web address]  [facility]  Office  [phone]  CC: Primary care physician; Smitty CordsKaramalegos, Alexander J, DO

## 2018-12-18 NOTE — Progress Notes (Signed)
*  PRELIMINARY RESULTS* Echocardiogram 2D Echocardiogram has been performed.  Sherrie Sport 12/18/2018, 9:50 AM

## 2018-12-18 NOTE — Progress Notes (Signed)
OT Cancellation Note  Patient Details Name: CAMILLA SKEEN MRN: [id number] DOB: 18-Jul-1940   Cancelled Treatment:    Reason Eval/Treat Not Completed: Patient at procedure or test/ unavailable(Will continue to monitor at a later time, or date.)  Harrel Carina, MS, OTR/L 12/18/2018, 10:42 AM

## 2018-12-18 NOTE — Progress Notes (Signed)
Initial Nutrition Assessment  RD working remotely.  DOCUMENTATION CODES:   Not applicable  INTERVENTION:  Provide Ensure Enlive po BID, each supplement provides 350 kcal and 20 grams of protein. Patient prefers chocolate.  Continue daily MVI.  NUTRITION DIAGNOSIS:   Inadequate oral intake related to decreased appetite as evidenced by per patient/family report.  GOAL:   Patient will meet greater than or equal to 90% of their needs  MONITOR:   PO intake, Supplement acceptance, Labs, Weight trends, I & O's  REASON FOR ASSESSMENT:   Malnutrition Screening Tool    ASSESSMENT:   78 year old female with PMHx of HTN, vertigo, hx CVA 2013, depression, anxiety, OP, recent fall and left hip fracture admitted with weakness and confusion, acute left pontine infarct.   Spoke with patient over the phone. She had some dysarthria and was somewhat confused. She initially reported her appetite was fine, but then later reported it was decreased and she has not been eating very well. She is unsure how long this has been going on. She is not able to provide any details on intake. Per chart she ate 50% of breakfast this morning. She is amenable to drinking chocolate Ensure. Weights in chart appear stable. She fluctuates between 68-71 kg.  Medications reviewed and include: vitamin D 5000 units daily, Colace 100 mg BID, lisinopril 40 mg daily, MVI daily, pantoprazole, potassium chloride 10 mEq daily.  Labs reviewed: Sodium 134.  NUTRITION - FOCUSED PHYSICAL EXAM:  Unable to complete at this time.  Diet Order:   Diet Order            Diet heart healthy/carb modified Room service appropriate? Yes; Fluid consistency: Thin  Diet effective now             EDUCATION NEEDS:   No education needs have been identified at this time  Skin:  Skin Assessment: Skin Integrity Issues:(closed incision left hip)  Last BM:  12/16/2018 per chart  Height:   Ht Readings from Last 1 Encounters:   12/17/18 5\' 2"  (1.575 m)   Weight:   Wt Readings from Last 1 Encounters:  12/17/18 69.9 kg   Ideal Body Weight:  50 kg  BMI:  Body mass index is 28.17 kg/m.  Estimated Nutritional Needs:   Kcal:  1500-1700  Protein:  75-85 grams  Fluid:  1.5-1.7 L/day  Willey Blade, MS, RD, LDN Office: [phone] Pager: [phone] After Hours/Weekend Pager: [phone]

## 2018-12-18 NOTE — Consult Note (Signed)
Reason for Consult:left leg weakness/mri wit questionable right pontine stroke Referring Physician: Dr. Allena Katzpatel  CC: left leg weakness.  Patient is a poor historian. HP mostly per chart  HPI:" Jill Hancock is an 78 y.o. female with  known history of depression, anxiety, HTN previous stroke, vertigo and unsteady gait comes to the emergency room from home after she started having right lower extremity weakness."  Interviewing the patient, patient is complaining of left leg weakness associated with tight pain. She states that left leg weakness started a week ago. Per chart, patient fell 5 weeks ago and injured her left hip. She is s/p surgery with intra medullary nail.Per orthopedist note, "patient's daughter noticed increased pain and weakness in the lower extremity that prompted the visit to ER". Denies LOC, nausea/vting, sz like activity, visual disturbances, c/o left leg weakness/pain  Patient had mri done that showed punctate focus of abnormal diffusion restriction within the left pons with associated magnetic susceptibility effect on T2*-weightedimaging. This could represent a small acute infarct, as this location is in keeping with the reported right lower extremityweakness. 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 Numerous chronic microhemorrhages, predominantly central, most consistent with chronic hypertensive angiopathy.    Past Medical History:  Diagnosis Date  . Anxiety   . Depression   . Hyperglycemia   . Hypertension   . Hypokalemia   . Osteoporosis   . Stroke [facility](HCC) 02/2012   MRI revealed at least 3 subcentimeter acute infarctions with one area of subacute infarction in widely disparate vascular territories including territory of left cerebellum and around right caudate nucleus along with widespread lacunar infarcts, chronic microvascular ischemic change,  and numerous microbleeds suggesting long standing hypertensive cerebrovascular disease.  MRA confirmed intracranial  . Unsteady gait   . Vertigo     Past Surgical History:  Procedure Laterality Date  . BREAST BIOPSY Right    pt not sure when   . CHOLECYSTECTOMY    . INTRAMEDULLARY (IM) NAIL INTERTROCHANTERIC Left 11/21/2018   Procedure: INTRAMEDULLARY (IM) NAIL INTERTROCHANTRIC;  Surgeon: Juanell FairlyKrasinski, Kevin, MD;  Location: [facility] ORS;  Service: Orthopedics;  Laterality: Left;  Marland Kitchen. VAGINAL HYSTERECTOMY      Family History  Problem Relation Age of Onset  . Heart attack Mother   . Heart disease Mother   . Heart disease Father   . Heart attack Brother        death at age 79  . Stroke Brother        death at 6661  . Bladder Cancer Neg Hx   . Kidney cancer Neg Hx     Social History:  reports that she has never smoked. She has never used smokeless tobacco. She reports that she does not drink alcohol or use drugs.  Allergies  Allergen Reactions  . Demerol [Meperidine] Nausea And Vomiting  . Macrobid [Nitrofurantoin Macrocrystal] Rash  . Penicillins Rash    Has patient had a PCN reaction causing immediate rash, facial/tongue/throat swelling, SOB or lightheadedness with hypotension: Unknown Has patient had a PCN reaction causing severe rash involving mucus membranes or skin necrosis: Unknown Has patient had a PCN reaction that required hospitalization: Unknown Has patient had a PCN reaction occurring within the last 10 years: No If all of the above answers are "NO", then may proceed with Cephalosporin use.     Medications: I have reviewed the patient's current medications.  ROS: C/o lef leg weakness/pain,  no visual disturbamces , no HA, no nausea/vting. Physical Examination: Blood pressure 134/71, pulse 73, temperature 97.6 F (36.4 C), temperature source Oral, resp. rate 18, height 5\' 2"  (1.575 m), weight 69.9 kg, SpO2 93 %.  Neurologic Examination Patient is alert and awake,  oriented x2, speech is nle and follows commands. CN: PERLA, EOMI, VFF, face symmetrical, face sensation seems nle, palate and tongue midline Motor: RUEx: 4/5 on biceps- tricpes flexion/extension, wrist flesion/extension RLEx: 4/5 on hip flesion/extension, knee flexion/extension and plantar flexion/extension LUEX: 4/5 on biceps flexion extension, wrist flexion/extension. LLEX : exam limited by pain but she does have a 4/5 plantar flexion/extension, knee flexion/extension. Did not chek hip. No sensory deficit appreciated No coordination deficit appreciated dtr and gait  not checked at this time   Results for orders placed or performed during the hospital encounter of 12/17/18 (from the past 48 hour(s))  Basic metabolic panel     Status: Abnormal   Collection Time: 12/17/18  1:52 PM  Result Value Ref Range   Sodium 134 (L) 135 - 145 mmol/L   Potassium 3.6 3.5 - 5.1 mmol/L   Chloride 101 98 - 111 mmol/L   CO2 25 22 - 32 mmol/L   Glucose, Bld 107 (H) 70 - 99 mg/dL   BUN 12 8 - 23 mg/dL   Creatinine, Ser 0.980.73 0.44 - 1.00 mg/dL   Calcium 9.0 8.9 - 11.910.3 mg/dL   GFR calc non Af Amer >60 >60 mL/min   GFR calc Af Amer >60 >60 mL/min   Anion gap 8 5 - 15    Comment: Performed at [facility], [address] [place], [place] [address]  CBC with Differential     Status: Abnormal   Collection Time: 12/17/18  1:52 PM  Result Value Ref Range   WBC 8.6 4.0 - 10.5 K/uL   RBC 3.93 3.87 - 5.11 MIL/uL   Hemoglobin 11.2 (L) 12.0 - 15.0 g/dL   HCT 95.635.1 (L) 21.336.0 - 08.646.0 %   MCV 89.3 80.0 - 100.0 fL   MCH 28.5 26.0 - 34.0 pg   MCHC 31.9 30.0 - 36.0 g/dL   RDW 57.815.1 46.911.5 - 62.915.5 %   Platelets 456 (H) 150 - 400 K/uL   nRBC 0.0 0.0 - 0.2 %   Neutrophils Relative % 70 %   Neutro Abs 6.1 1.7 - 7.7 K/uL   Lymphocytes Relative 19 %   Lymphs Abs 1.6 0.7 - 4.0 K/uL   Monocytes Relative 8 %   Monocytes Absolute 0.7 0.1 - 1.0 K/uL   Eosinophils Relative 2 %   Eosinophils Absolute 0.2 0.0 - 0.5  K/uL   Basophils Relative 0 %   Basophils Absolute 0.0 0.0 - 0.1 K/uL   Immature Granulocytes 1 %   Abs Immature Granulocytes 0.05 0.00 - 0.07 K/uL    Comment: Performed at [facility], [address] [place], [place] [address]  Troponin I - Once     Status: Abnormal   Collection Time: 12/17/18  1:52 PM  Result Value Ref Range   Troponin I 0.07 (HH) <0.03 ng/mL    Comment: CRITICAL RESULT CALLED TO, READ BACK BY AND VERIFIED WITH Tory EmeraldSHANNON MARTIN RN AT 1427 ON 12/17/18 [facility] Performed at [facility], [address] [place], [place] [address]   Lactic acid, plasma     Status: None   Collection Time: 12/17/18  1:52 PM  Result Value Ref Range   Lactic Acid, Venous 1.3 0.5 - 1.9 mmol/L  Comment: Performed at [facility], [address] [place], [place] [address]  Lipid panel     Status: None   Collection Time: 12/17/18  1:52 PM  Result Value Ref Range   Cholesterol 134 0 - 200 mg/dL   Triglycerides 604 <540 mg/dL   HDL 60 >98 mg/dL   Total CHOL/HDL Ratio 2.2 RATIO   VLDL 21 0 - 40 mg/dL   LDL Cholesterol 53 0 - 99 mg/dL    Comment:        Total Cholesterol/HDL:CHD Risk Coronary Heart Disease Risk Table                     Men   Women  1/2 Average Risk   3.4   3.3  Average Risk       5.0   4.4  2 X Average Risk   9.6   7.1  3 X Average Risk  23.4   11.0        Use the calculated Patient Ratio above and the CHD Risk Table to determine the patient's CHD Risk.        ATP III CLASSIFICATION (LDL):  <100     mg/dL   Optimal  119-147  mg/dL   Near or Above                    Optimal  130-159  mg/dL   Borderline  829-562  mg/dL   High  >130     mg/dL   Very High Performed at [facility], [address] [place], [place] [address]   Troponin I - Once-Timed     Status: Abnormal   Collection Time: 12/17/18  5:18 PM  Result Value Ref Range   Troponin I 0.06 (HH) <0.03 ng/mL    Comment: CRITICAL VALUE NOTED. VALUE IS CONSISTENT WITH  PREVIOUSLY REPORTED/CALLED VALUE/TFK Performed at [facility], [address] [place], [place] [address]   SARS Coronavirus 2 (CEPHEID- Performed in [facility] hospital lab), Hosp Order     Status: None   Collection Time: 12/17/18  7:38 PM   Specimen: Nasopharyngeal Swab  Result Value Ref Range   SARS Coronavirus 2 NEGATIVE NEGATIVE    Comment: (NOTE) If result is NEGATIVE SARS-CoV-2 target nucleic acids are NOT DETECTED. The SARS-CoV-2 RNA is generally detectable in upper and lower  respiratory specimens during the acute phase of infection. The lowest  concentration of SARS-CoV-2 viral copies this assay can detect is 250  copies / mL. A negative result does not preclude SARS-CoV-2 infection  and should not be used as the sole basis for treatment or other  patient management decisions.  A negative result may occur with  improper specimen collection / handling, submission of specimen other  than nasopharyngeal swab, presence of viral mutation(s) within the  areas targeted by this assay, and inadequate number of viral copies  (<250 copies / mL). A negative result must be combined with clinical  observations, patient history, and epidemiological information. If result is POSITIVE SARS-CoV-2 target nucleic acids are DETECTED. The SARS-CoV-2 RNA is generally detectable in upper and lower  respiratory specimens dur ing the acute phase of infection.  Positive  results are indicative of active infection with SARS-CoV-2.  Clinical  correlation with patient history and other diagnostic information is  necessary to determine patient infection status.  Positive results do  not rule out bacterial infection or co-infection with other viruses. If result is PRESUMPTIVE POSTIVE  SARS-CoV-2 nucleic acids MAY BE PRESENT.   A presumptive positive result was obtained on the submitted specimen  and confirmed on repeat testing.  While 2019 novel coronavirus  (SARS-CoV-2) nucleic acids may be  present in the submitted sample  additional confirmatory testing may be necessary for epidemiological  and / or clinical management purposes  to differentiate between  SARS-CoV-2 and other Sarbecovirus currently known to infect humans.  If clinically indicated additional testing with an alternate test  methodology [phone]([id number]) is advised. The SARS-CoV-2 RNA is generally  detectable in upper and lower respiratory sp ecimens during the acute  phase of infection. The expected result is Negative. Fact Sheet for Patients:  BoilerBrush.com.cyhttps://www.fda.gov/media/136312/download Fact Sheet for Healthcare Providers: https://pope.com/https://www.fda.gov/media/136313/download This test is not yet approved or cleared by the [place] FDA and has been authorized for detection and/or diagnosis of SARS-CoV-2 by FDA under an Emergency Use Authorization (EUA).  This EUA will remain in effect (meaning this test can be used) for the duration of the COVID-19 declaration under Section 564(b)(1) of the Act, 21 U.S.C. section 360bbb-3(b)(1), unless the authorization is terminated or revoked sooner. Performed at [facility], [address] [place], [place] [address]     Recent Results (from the past 240 hour(s))  SARS Coronavirus 2 (CEPHEID- Performed in [facility] hospital lab), Hosp Order     Status: None   Collection Time: 12/17/18  7:38 PM   Specimen: Nasopharyngeal Swab  Result Value Ref Range Status   SARS Coronavirus 2 NEGATIVE NEGATIVE Final    Comment: (NOTE) If result is NEGATIVE SARS-CoV-2 target nucleic acids are NOT DETECTED. The SARS-CoV-2 RNA is generally detectable in upper and lower  respiratory specimens during the acute phase of infection. The lowest  concentration of SARS-CoV-2 viral copies this assay can detect is 250  copies / mL. A negative result does not preclude SARS-CoV-2 infection  and should not be used as the sole basis for treatment or other  patient management decisions.  A negative  result may occur with  improper specimen collection / handling, submission of specimen other  than nasopharyngeal swab, presence of viral mutation(s) within the  areas targeted by this assay, and inadequate number of viral copies  (<250 copies / mL). A negative result must be combined with clinical  observations, patient history, and epidemiological information. If result is POSITIVE SARS-CoV-2 target nucleic acids are DETECTED. The SARS-CoV-2 RNA is generally detectable in upper and lower  respiratory specimens dur ing the acute phase of infection.  Positive  results are indicative of active infection with SARS-CoV-2.  Clinical  correlation with patient history and other diagnostic information is  necessary to determine patient infection status.  Positive results do  not rule out bacterial infection or co-infection with other viruses. If result is PRESUMPTIVE POSTIVE SARS-CoV-2 nucleic acids MAY BE PRESENT.   A presumptive positive result was obtained on the submitted specimen  and confirmed on repeat testing.  While 2019 novel coronavirus  (SARS-CoV-2) nucleic acids may be present in the submitted sample  additional confirmatory testing may be necessary for epidemiological  and / or clinical management purposes  to differentiate between  SARS-CoV-2 and other Sarbecovirus currently known to infect humans.  If clinically indicated additional testing with an alternate test  methodology [phone]([id number]) is advised. The SARS-CoV-2 RNA is generally  detectable in upper and lower respiratory sp ecimens during the acute  phase of infection. The expected result is Negative. Fact Sheet for Patients:  BoilerBrush.com.cyhttps://www.fda.gov/media/136312/download Fact Sheet  for Healthcare Providers: BankingDealers.co.za This test is not yet approved or cleared by the [place] and has been authorized for detection and/or diagnosis of SARS-CoV-2 by FDA under an Emergency Use Authorization  (EUA).  This EUA will remain in effect (meaning this test can be used) for the duration of the COVID-19 declaration under Section 564(b)(1) of the Act, 21 U.S.C. section 360bbb-3(b)(1), unless the authorization is terminated or revoked sooner. Performed at [facility], [place], [place] [address]     Ct Head Wo Contrast  Result Date: 12/17/2018 CLINICAL DATA:  History of stroke EXAM: CT HEAD WITHOUT CONTRAST TECHNIQUE: Contiguous axial images were obtained from the base of the skull through the vertex without intravenous contrast. COMPARISON:  11/22/2018 FINDINGS: Brain: No evidence of acute infarction, hemorrhage, hydrocephalus, extra-axial collection or mass lesion/mass effect. There is extensive, confluent periventricular and deep white matter hypodensity of the bilateral cerebral hemispheres Vascular: No hyperdense vessel or unexpected calcification. Skull: Normal. Negative for fracture or focal lesion. Sinuses/Orbits: No acute finding. Other: None. IMPRESSION: No acute intracranial pathology. There is extensive, confluent periventricular and deep white matter hypodensity of the bilateral cerebral hemispheres, consistent with advanced small-vessel white matter disease and prior infarctions. Consider MRI to more sensitively evaluate for acutely superimposed diffusion restricting infarction in this background. Electronically Signed   By: Eddie Candle M.D.   On: 12/17/2018 14:48   Mr Brain Wo Contrast  Result Date: 12/17/2018 CLINICAL DATA:  Right lower extremity weakness EXAM: MRI HEAD WITHOUT CONTRAST TECHNIQUE: Multiplanar, multiecho pulse sequences of the brain and surrounding structures were obtained without intravenous contrast. COMPARISON:  Head CT 12/17/2018 FINDINGS: BRAIN: Punctate focus of abnormal diffusion restriction within the left pons. There is also blooming in this area on the T2-weighted gradient echo sequence. No other diffusion abnormality. The midline  structures are normal. There are old infarcts of the right cerebellum, right pons and both deep gray nuclei. Diffuse confluent hyperintense T2-weighted signal within the periventricular, deep and juxtacortical white matter, most commonly due to chronic ischemic microangiopathy. The cerebral and cerebellar volume are age-appropriate. No hydrocephalus. Multiple chronic microhemmorhages in a predominantly central distribution. No mass lesion. VASCULAR: The major intracranial arterial and venous sinus flow voids are normal. SKULL AND UPPER CERVICAL SPINE: Calvarial bone marrow signal is normal. There is no skull base mass. Visualized upper cervical spine and soft tissues are normal. SINUSES/ORBITS: No fluid levels or advanced mucosal thickening. No mastoid or middle ear effusion. The orbits are normal. IMPRESSION: 1. Punctate focus of abnormal diffusion restriction within the left pons with associated magnetic susceptibility effect on T2*-weighted imaging. This could represent a small acute infarct, as this location is in keeping with the reported right lower extremity weakness. However this could also be a susceptibility artifact due to chronic blood products. 2. 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 3. Numerous chronic microhemorrhages, predominantly central, most consistent with chronic hypertensive angiopathy. Electronically Signed   By: Ulyses Jarred M.D.   On: 12/17/2018 17:16   Dg Chest Portable 1 View  Result Date: 12/17/2018 CLINICAL DATA:  Pain EXAM: PORTABLE CHEST 1 VIEW COMPARISON:  11/21/2018 FINDINGS: The heart size is stable. No pneumothorax. No large pleural effusion. There is likely some atelectasis versus scarring at the lung bases. No acute osseous abnormality. IMPRESSION: No active disease. Electronically Signed   By: Constance Holster M.D.   On: 12/17/2018 15:08   Dg Hip Unilat W Or Wo Pelvis 2-3 Views Left  Result Date: 12/17/2018 CLINICAL DATA:   The patient suffered a left intertrochanteric fracture in a fall 11/21/2018 and underwent fixation that same day. Left hip pain today. No new injury. Subsequent encounter. EXAM: DG HIP (WITH OR WITHOUT PELVIS) 2-3V LEFT COMPARISON:  Plain films left hip 11/21/2018. FINDINGS: Hip screw and short intramedullary nail are in place for fixation of a left intertrochanteric fracture. Hardware is intact. Position and alignment are unchanged. A cortical lucency is seen in the anterior and lateral cortex of the proximal diaphysis of the femur consistent with a periprosthetic fracture along the midportion of the intramedullary nail. This is not visible on the prior exam. IMPRESSION: Incomplete, nondisplaced diaphyseal fracture along the midportion of the patient's intramedullary nail is not visible on the immediate postoperative films. Left intertrochanteric fracture without change in position or alignment. Electronically Signed   By: Drusilla Kanner M.D.   On: 12/17/2018 15:10     Assessment/Plan: 78 y.o. female with  known history of depression, anxiety, HTN previous stroke, vertigo and unsteady gait comes to the emergency room from home after she started having right lower extremity weakness. Chart review, and my interviewing, patient is complaining of left leg weakness and pain. Exam reveals a strong both LEx. Mri with questionable left pons which does not correlate with left leg weakness if indeed patient has left leg weakness.  Recs: - neuroprotective measures inclduing normothermia, normoglycemia, correct electrolytes/metabolic abnormalities, treat infection - patient has risk factors for stroke: Echo, carotid dopplers, continue ASA - PT/OT - appreciate ortho recs - remaining of treatment by primary team - thank you for the consult.    12/18/2018, 9:18 AM

## 2018-12-18 NOTE — Evaluation (Signed)
Physical Therapy Evaluation Patient Details Name: Jill CockerLinda S Hancock MRN: [id number] DOB: [birth year] Today's Date: 12/18/2018   History of Present Illness  Pt. is a 78 y.o. female who was [facility] with worsening L LE pain, progressive weakness. 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 Pt. also has a new periprosthetic incomplete Hip Fx s/p ORIF IM nailing of the left intertrochanteric Hip Fx. Pt. had been in SNF at [facility] for therapy following her initial hip fracture, and was admitted to the hospital 1 day after discharging home from the SNF.  Clinical Impression  Patient known to therapist from previous admission. Marked improvement in cognition and expressive abilities noted this date.  Rates L LE pain 6-8/10, but agreeable to attempt mobility with therapist as tolerated.  Generally guarded throughout all L LE movement and WBing.  Able to complete bed mobility with min assist (transition towards L), sit/stand, basic transfers and gait (10') with RW, min/mod assist +2 (second person for chair follow).  Consistent manual cuing for balance, anterior weight translation and overall weight shifting throughout gait cycle. Limited ability to tolerate WBing/stance time L LE due to pain.  Will continue to assess/progress gait as tolerated.  Unsafe to attempt without consistent +1 and RW at this time. Would benefit from skilled PT to address above deficits and promote optimal return to PLOF; recommend transition to STR upon discharge from acute hospitalization.     Follow Up Recommendations SNF    Equipment Recommendations       Recommendations for Other Services       Precautions / Restrictions Precautions Precautions: Fall Restrictions Weight Bearing Restrictions: Yes LLE Weight Bearing: Partial weight bearing(per discussion with Dr. Martha ClanKrasinski)      Mobility  Bed Mobility Overal  bed mobility: Needs Assistance Bed Mobility: Supine to Sit     Supine to sit: Min assist        Transfers Overall transfer level: Needs assistance Equipment used: Rolling walker (2 wheeled) Transfers: Sit to/from Stand Sit to Stand: Min assist;Mod assist         General transfer comment: assist for lift off, anterior weight translation; utilizes posterior surface of bilat LEs against edge of bed to assist with lift off/steadying  Ambulation/Gait Ambulation/Gait assistance: Min assist;Mod assist;+2 safety/equipment Gait Distance (Feet): 10 Feet Assistive device: Rolling walker (2 wheeled)       General Gait Details: 3-point, step to gait pattern; decreased stance time/WBing L LE; maintains L LE anterior to BOS, difficulty with R LE advancement due to L LE pain  Stairs            Wheelchair Mobility    Modified Rankin (Stroke Patients Only)       Balance Overall balance assessment: Needs assistance Sitting-balance support: No upper extremity supported;Feet supported Sitting balance-Leahy Scale: Fair     Standing balance support: Bilateral upper extremity supported Standing balance-Leahy Scale: Poor                               Pertinent Vitals/Pain Pain Assessment: Faces Faces Pain Scale: Hurts little more Pain Location: L hip with movement Pain Descriptors / Indicators: Aching;Grimacing;Guarding Pain Intervention(s): Limited activity within patient's tolerance;Monitored during session;Premedicated before session;Repositioned    Home Living Family/patient expects to be discharged to:: Private residence Living Arrangements: Alone Available Help at Discharge: Family;Available PRN/intermittently Type of Home: Apartment Home  Access: Level entry     Home Layout: One level Home Equipment: [place] - 2 wheels;Shower seat      Prior Function Level of Independence: Needs assistance      ADL's / Homemaking Assistance Needed: Per daughter  report: daughter assists with bathing, dressing, medication management, meals on wheels. A neighbor friend checked on the pt. often.  Comments: Per patient report, mod indep with RW in home environment, denies additional falls; however, noted difficulty with word-finding and recall/communication of information.  Will verify with family as appropriate.  Reports daughter has been providing assist with ADLs, meds, meals since discharge from STR, but has not/cannot provide 24/7     Hand Dominance   Dominant Hand: Right    Extremity/Trunk Assessment   Upper Extremity Assessment Upper Extremity Assessment: Overall WFL for tasks assessed    Lower Extremity Assessment Lower Extremity Assessment: Generalized weakness(L LE grossly 3-/5, limited by pain)       Communication   Communication: Expressive difficulties  Cognition Arousal/Alertness: Awake/alert Behavior During Therapy: WFL for tasks assessed/performed Overall Cognitive Status: Within Functional Limits for tasks assessed                                 General Comments: pleasant and cooperative, following commands; improved expressive speech compared to previous admission      General Comments      Exercises Other Exercises Other Exercises: Bed/chair with RW, min/mod assist; hand-over-hand asist for walker mangement, weight shift   Assessment/Plan    PT Assessment Patient needs continued PT services  PT Problem List Decreased strength;Decreased range of motion;Decreased activity tolerance;Decreased balance;Decreased mobility;Decreased knowledge of use of DME;Decreased safety awareness;Decreased knowledge of precautions;Decreased skin integrity;Pain;Decreased cognition;Decreased coordination       PT Treatment Interventions DME instruction;Gait training;Stair training;Functional mobility training;Therapeutic activities;Therapeutic exercise;Balance training;Cognitive remediation;Neuromuscular  re-education;Patient/family education    PT Goals (Current goals can be found in the Care Plan section)  Acute Rehab PT Goals Patient Stated Goal: agreeable to session PT Goal Formulation: With patient Time For Goal Achievement: 01/01/19 Potential to Achieve Goals: Fair    Frequency 7X/week   Barriers to discharge        Co-evaluation               AM-PAC PT "6 Clicks" Mobility  Outcome Measure Help needed turning from your back to your side while in a flat bed without using bedrails?: A Little Help needed moving from lying on your back to sitting on the side of a flat bed without using bedrails?: A Little Help needed moving to and from a bed to a chair (including a wheelchair)?: A Little Help needed standing up from a chair using your arms (e.g., wheelchair or bedside chair)?: A Lot Help needed to walk in hospital room?: A Lot Help needed climbing 3-5 steps with a railing? : A Lot 6 Click Score: 15    End of Session Equipment Utilized During Treatment: Gait belt Activity Tolerance: Patient tolerated treatment well Patient left: in chair;with call bell/phone within reach;with chair alarm set Nurse Communication: Mobility status PT Visit Diagnosis: Muscle weakness (generalized) (M62.81);Difficulty in walking, not elsewhere classified (R26.2) Pain - Right/Left: Left Pain - part of body: Hip    Time: 1459-1520 PT Time Calculation (min) (ACUTE ONLY): 21 min   Charges:   PT Evaluation $PT Eval Moderate Complexity: 1 Mod PT Treatments $Therapeutic Activity: 8-22 mins  Jacquelina Hewins H. Manson PasseyBrown, PT, DPT, NCS 12/18/18, 4:07 PM [phone]

## 2018-12-18 NOTE — Consult Note (Addendum)
ORTHOPAEDIC CONSULTATION  REQUESTING PHYSICIAN: Enid BaasKalisetti, Radhika, MD  Chief Complaint: Left hip pain  HPI: Patient seen at 2 pm today.  Jill Hancock is a 78 y.o. female admitted yesterday after her daughter noted increased lower extremity pain and weakness making it difficult for her to ambulate.  Patient had CT and MRI of her brain which showed small acute infarct in the left pons.  Patient denies any recent falls.   Patient is complaining of pain in left hip.  She had IM nail fixation of left IT hip fracture by me on 11/21/18.  Patient has new lucency in the lateral cortex of the femur above the distal interlocking screw which was not seen on initial post-op films but was seen on post-op office films on 12/04/18.  The fracture has not displaced compared to her office films.    Past Medical History:  Diagnosis Date  . Anxiety   . Depression   . Hyperglycemia   . Hypertension   . Hypokalemia   . Osteoporosis   . Stroke [facility](HCC) 02/2012   MRI revealed at least 3 subcentimeter acute infarctions with one area of subacute infarction in widely disparate vascular territories including territory of left cerebellum and around right caudate nucleus along with widespread lacunar infarcts, chronic microvascular ischemic change, and numerous microbleeds suggesting long standing hypertensive cerebrovascular disease.  MRA confirmed intracranial  . Unsteady gait   . Vertigo    Past Surgical History:  Procedure Laterality Date  . BREAST BIOPSY Right    pt not sure when   . CHOLECYSTECTOMY    . INTRAMEDULLARY (IM) NAIL INTERTROCHANTERIC Left 11/21/2018   Procedure: INTRAMEDULLARY (IM) NAIL INTERTROCHANTRIC;  Surgeon: Juanell FairlyKrasinski, Derya Dettmann, MD;  Location: [facility] ORS;  Service: Orthopedics;  Laterality: Left;  Marland Kitchen. VAGINAL HYSTERECTOMY     Social History   Socioeconomic History  . Marital status: Divorced    Spouse name: Not on file  . Number of children: 5  . Years of education: Graduate - [employer]  . Highest  education level: Associate degree: occupational, Scientist, product/process developmenttechnical, or vocational program  Occupational History  . Occupation: Retired Production assistant, radioN  Social Needs  . Financial resource strain: Not hard at all  . Food insecurity    Worry: Never true    Inability: Never true  . Transportation needs    Medical: No    Non-medical: No  Tobacco Use  . Smoking status: Never Smoker  . Smokeless tobacco: Never Used  Substance and Sexual Activity  . Alcohol use: No    Alcohol/week: 0.0 standard drinks  . Drug use: No  . Sexual activity: Yes  Lifestyle  . Physical activity    Days per week: 0 days    Minutes per session: 0 min  . Stress: Only a little  Relationships  . Social Musicianconnections    Talks on phone: Once a week    Gets together: Twice a week    Attends religious service: Never    Active member of club or organization: No    Attends meetings of clubs or organizations: Never    Relationship status: Divorced  Other Topics Concern  . Not on file  Social History Narrative   Lives at home alone, has 4 daughters that come and help daily   Family History  Problem Relation Age of Onset  . Heart attack Mother   . Heart disease Mother   . Heart disease Father   . Heart attack Brother        death at  age 79  . Stroke Brother        death at 1361  . Bladder Cancer Neg Hx   . Kidney cancer Neg Hx    Allergies  Allergen Reactions  . Demerol [Meperidine] Nausea And Vomiting  . Macrobid [Nitrofurantoin Macrocrystal] Rash  . Penicillins Rash    Has patient had a PCN reaction causing immediate rash, facial/tongue/throat swelling, SOB or lightheadedness with hypotension: Unknown Has patient had a PCN reaction causing severe rash involving mucus membranes or skin necrosis: Unknown Has patient had a PCN reaction that required hospitalization: Unknown Has patient had a PCN reaction occurring within the last 10 years: No If all of the above answers are "NO", then may proceed with Cephalosporin use.     Prior to Admission medications   Medication Sig Start Date End Date Taking? Authorizing Provider  acetaminophen (TYLENOL) 500 MG tablet Take 500 mg by mouth 3 (three) times daily.    Yes [provider]  alendronate (FOSAMAX) 70 MG tablet TAKE 1 TABLET EVERY 7 DAYS.  SEE PACKAGE FOR ADDITIONAL INSTRUCTIONS Patient taking differently: Take 70 mg by mouth every Monday.  02/22/18  Yes Karamalegos, Alexander J, DO  amLODipine (NORVASC) 10 MG tablet TAKE 1 TABLET BY MOUTH EVERY DAY Patient taking differently: Take 10 mg by mouth daily.  08/26/18  Yes Karamalegos, Netta NeatAlexander J, DO  aspirin 81 MG tablet Take 1 tablet (81 mg total) by mouth daily. 03/07/13  Yes Ronal FearLam, Lynn E, NP  Cholecalciferol (VITAMIN D3) 125 MCG (5000 UT) CAPS Take 1 capsule (5,000 Units total) by mouth daily. For 12 weeks, then start Vitamin D3 2,000 units daily (OTC) 07/29/18  Yes Karamalegos, Netta NeatAlexander J, DO  Cranberry-Vitamin C (AZO CRANBERRY URINARY TRACT PO) Take 2 tablets by mouth daily. 03/20/18  Yes [provider]  docusate sodium (COLACE) 100 MG capsule Take 1 capsule (100 mg total) by mouth 2 (two) times daily. 11/26/18  Yes Altamese DillingVachhani, Vaibhavkumar, MD  FLUoxetine (PROZAC) 40 MG capsule Take 1 capsule (40 mg total) by mouth daily. 05/28/18  Yes Karamalegos, Alexander J, DO  ipratropium (ATROVENT) 0.06 % nasal spray Place 2 sprays into both nostrils 4 (four) times daily. For up to 5-7 days then stop. 04/10/18  Yes Karamalegos, Netta NeatAlexander J, DO  lisinopril (PRINIVIL,ZESTRIL) 40 MG tablet Take 1 tablet (40 mg total) by mouth daily. 02/22/18  Yes Karamalegos, Netta NeatAlexander J, DO  Multiple Vitamins-Minerals (MULTIVITAMIN WITH MINERALS) tablet Take 1 tablet by mouth daily.   Yes [provider]  polyethylene glycol (MIRALAX / GLYCOLAX) 17 g packet Take 17 g by mouth daily as needed for mild constipation. 11/26/18  Yes Altamese DillingVachhani, Vaibhavkumar, MD  potassium chloride (K-DUR,KLOR-CON) 10 MEQ tablet Take 1 tablet (10 mEq  total) by mouth daily. 05/28/18  Yes Karamalegos, Netta NeatAlexander J, DO  pravastatin (PRAVACHOL) 20 MG tablet Take 1 tablet (20 mg total) by mouth at bedtime. 02/22/18  Yes Karamalegos, Netta NeatAlexander J, DO  esomeprazole (NEXIUM) 20 MG capsule TAKE 1 CAPSULE (20 MG TOTAL) BY MOUTH DAILY BEFORE BREAKFAST. 12/18/18   Smitty CordsKaramalegos, Alexander J, DO   Ct Head Wo Contrast  Result Date: 12/17/2018 CLINICAL DATA:  History of stroke EXAM: CT HEAD WITHOUT CONTRAST TECHNIQUE: Contiguous axial images were obtained from the base of the skull through the vertex without intravenous contrast. COMPARISON:  11/22/2018 FINDINGS: Brain: No evidence of acute infarction, hemorrhage, hydrocephalus, extra-axial collection or mass lesion/mass effect. There is extensive, confluent periventricular and deep white matter hypodensity of the bilateral cerebral hemispheres Vascular: No  hyperdense vessel or unexpected calcification. Skull: Normal. Negative for fracture or focal lesion. Sinuses/Orbits: No acute finding. Other: None. IMPRESSION: No acute intracranial pathology. There is extensive, confluent periventricular and deep white matter hypodensity of the bilateral cerebral hemispheres, consistent with advanced small-vessel white matter disease and prior infarctions. Consider MRI to more sensitively evaluate for acutely superimposed diffusion restricting infarction in this background. Electronically Signed   By: Lauralyn PrimesAlex  Bibbey M.D.   On: 12/17/2018 14:48   Mr Brain Wo Contrast  Result Date: 12/17/2018 CLINICAL DATA:  Right lower extremity weakness EXAM: MRI HEAD WITHOUT CONTRAST TECHNIQUE: Multiplanar, multiecho pulse sequences of the brain and surrounding structures were obtained without intravenous contrast. COMPARISON:  Head CT 12/17/2018 FINDINGS: BRAIN: Punctate focus of abnormal diffusion restriction within the left pons. There is also blooming in this area on the T2-weighted gradient echo sequence. No other diffusion abnormality. The midline  structures are normal. There are old infarcts of the right cerebellum, right pons and both deep gray nuclei. Diffuse confluent hyperintense T2-weighted signal within the periventricular, deep and juxtacortical white matter, most commonly due to chronic ischemic microangiopathy. The cerebral and cerebellar volume are age-appropriate. No hydrocephalus. Multiple chronic microhemmorhages in a predominantly central distribution. No mass lesion. VASCULAR: The major intracranial arterial and venous sinus flow voids are normal. SKULL AND UPPER CERVICAL SPINE: Calvarial bone marrow signal is normal. There is no skull base mass. Visualized upper cervical spine and soft tissues are normal. SINUSES/ORBITS: No fluid levels or advanced mucosal thickening. No mastoid or middle ear effusion. The orbits are normal. IMPRESSION: 1. Punctate focus of abnormal diffusion restriction within the left pons with associated magnetic susceptibility effect on T2*-weighted imaging. This could represent a small acute infarct, as this location is in keeping with the reported right lower extremity weakness. However this could also be a susceptibility artifact due to chronic blood products. 2. 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 3. Numerous chronic microhemorrhages, predominantly central, most consistent with chronic hypertensive angiopathy. Electronically Signed   By: Deatra RobinsonKevin  Herman M.D.   On: 12/17/2018 17:16   [place] Carotid Bilateral (at [facility] And [facility] Only)  Result Date: 12/18/2018 CLINICAL DATA:  Hypertension, syncope, hyperlipidemia EXAM: BILATERAL CAROTID DUPLEX ULTRASOUND TECHNIQUE: Wallace CullensGray scale imaging, color Doppler and duplex ultrasound were performed of bilateral carotid and vertebral arteries in the neck. COMPARISON:  None. FINDINGS: Criteria: Quantification of carotid stenosis is based on velocity parameters that correlate the residual internal carotid diameter with NASCET-based stenosis levels,  using the diameter of the distal internal carotid lumen as the denominator for stenosis measurement. The following velocity measurements were obtained: RIGHT ICA: 47/14 cm/sec CCA: 45/10 cm/sec SYSTOLIC ICA/CCA RATIO:  1.0 ECA: 109 cm/sec LEFT ICA: 57/19 cm/sec CCA: 68/13 cm/sec SYSTOLIC ICA/CCA RATIO:  0.8 ECA: 71 cm/sec RIGHT CAROTID ARTERY: Minor echogenic shadowing plaque formation. No hemodynamically significant right ICA stenosis, velocity elevation, or turbulent flow. Degree of narrowing less than 50%. RIGHT VERTEBRAL ARTERY:  Antegrade LEFT CAROTID ARTERY: Similar scattered minor echogenic plaque formation. No hemodynamically significant left ICA stenosis, velocity elevation, or turbulent flow. LEFT VERTEBRAL ARTERY:  Antegrade IMPRESSION: Minor carotid atherosclerosis. No hemodynamically significant ICA stenosis. Degree of narrowing less than 50% bilaterally by ultrasound criteria. Patent antegrade vertebral flow bilaterally Electronically Signed   By: Judie PetitM.  Shick M.D.   On: 12/18/2018 11:07   Dg Chest Portable 1 View  Result Date: 12/17/2018 CLINICAL DATA:  Pain EXAM: PORTABLE CHEST 1 VIEW COMPARISON:  11/21/2018 FINDINGS: The heart size is  stable. No pneumothorax. No large pleural effusion. There is likely some atelectasis versus scarring at the lung bases. No acute osseous abnormality. IMPRESSION: No active disease. Electronically Signed   By: Constance Holster M.D.   On: 12/17/2018 15:08   Dg Hip Unilat W Or Wo Pelvis 2-3 Views Left  Result Date: 12/17/2018 CLINICAL DATA:  The patient suffered a left intertrochanteric fracture in a fall 11/21/2018 and underwent fixation that same day. Left hip pain today. No new injury. Subsequent encounter. EXAM: DG HIP (WITH OR WITHOUT PELVIS) 2-3V LEFT COMPARISON:  Plain films left hip 11/21/2018. FINDINGS: Hip screw and short intramedullary nail are in place for fixation of a left intertrochanteric fracture. Hardware is intact. Position and alignment are  unchanged. A cortical lucency is seen in the anterior and lateral cortex of the proximal diaphysis of the femur consistent with a periprosthetic fracture along the midportion of the intramedullary nail. This is not visible on the prior exam. IMPRESSION: Incomplete, nondisplaced diaphyseal fracture along the midportion of the patient's intramedullary nail is not visible on the immediate postoperative films. Left intertrochanteric fracture without change in position or alignment. Electronically Signed   By: Inge Rise M.D.   On: 12/17/2018 15:10    Positive ROS: All other systems have been reviewed and were otherwise negative with the exception of those mentioned in the HPI and as above.  Physical Exam: General: Alert, no acute distress  MUSCULOSKELETAL: Left hip:  Incisions C/D/I.  NVI.  Mild pain with logrolling of the left hip.  Assessment: Fracture of lateral cortex of proximal femur s/p IM fixation of left IT hip fracture  Plan: The patient's lateral cortical fracture seen on her ER films are consistent with her office films on 12/04/18.  The fracture is stabilized by her intramedullary rod.  I am recommending PWB 50% on the left lower extremity.   PT consult for gait training, hip ROM and lower extremity strengthening.   Follow up in my office in 2 weeks.  Patient is likely at increased fall risk given her acute infarct of the left pons and reported right lower extremity weakness.    Thornton Park, MD    12/18/2018 5:31 PM

## 2018-12-18 NOTE — Progress Notes (Signed)
SLP Cancellation Note  Patient Details Name: Jill Hancock MRN: [id number] DOB: Jun 30, 1941   Cancelled treatment:       Reason Eval/Treat Not Completed: Medical issues which prohibited therapy(chart reviewed; consulted NSG who was in w/ pt) Pt is c/o pain and requesting "more pain medicine". She appears to have min dysarthria(unsure how much is impacted by pain meds vs stroke vs both). She is answering general questions w/ NSG. Pt denies any swallowing problems; NSG has not noted any. ST services will f/u tomorrow w/ Dysarthria/Language eval tomorrow when pt feels calmer and pain is managed. NSG agreed.    Orinda Kenner, [place], CCC-SLP Watson,Katherine 12/18/2018, 2:03 PM

## 2018-12-18 NOTE — Progress Notes (Signed)
   [place] [facility] Day: 1 day Jill Hancock is a 78 y.o. female presenting with Hip Pain .   Advance care planning discussed with patient's daughter Ms. [name] over the phone. -Patient is alert but confused and unable to participate in goals of care conversation. Ms. Cecille Rubin has mentioned that she is patient's medical guardian.   -Explained about the current diagnosis of acute infarct in the left pons, periprosthetic left femoral fracture. -All questions in regards to overall condition and expected prognosis answered.   -Daughter understand that if patient's condition was to get worse, with repeat strokes or if cardiac arrest were to have been-she will have a poor quality of life and poor prognosis. -The decision was made to change her current code status to DNR  CODE STATUS: DNR Time spent: 18 minutes

## 2018-12-19 LAB — BASIC METABOLIC PANEL
Anion gap: 12 (ref 5–15)
BUN: 8 mg/dL (ref 8–23)
CO2: 25 mmol/L (ref 22–32)
Calcium: 8.9 mg/dL (ref 8.9–10.3)
Chloride: 101 mmol/L (ref 98–111)
Creatinine, Ser: 0.71 mg/dL (ref 0.44–1.00)
GFR calc Af Amer: >60 mL/min (ref >60)
GFR calc non Af Amer: >60 mL/min (ref >60)
Glucose, Bld: 102 mg/dL — ABNORMAL HIGH (ref 70–99)
Potassium: 3.6 mmol/L (ref 3.5–5.1)
Sodium: 138 mmol/L (ref 135–145)

## 2018-12-19 NOTE — Progress Notes (Signed)
Occupational Therapy Treatment Patient Details Name: Jill CockerLinda S Hancock MRN: [id number] DOB: 1940/08/04 Today's Date: 12/19/2018    History of present illness Pt. is a 78 y.o. female who was admitted to [facility] with worsening L LE pain, progressive weakness. 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 Pt. also has a new periprosthetic incomplete Hip Fx s/p ORIF IM nailing of the left intertrochanteric Hip Fx. Pt. had been in [facility] for therapy following her initial hip fracture, and was admitted to the hospital 1 day after discharging home from the SNF.   OT comments  Pt. education was provided about general reacher use. Pt. requires verbal cues, tactile cues, and visual demonstration to use the reacher to retrieve items, and linens. Pt. needs additional training, or may benefit from a dressing stick as pt. had difficulty managing the trigger on the reacher to open, and close it. Pt. continues to benefit from OT services for continued ADL training, A/E training, and pt. education about cognitive compensatory strategies during ADLs, and IADLs. Pt. would continue to benefit from SNF level of care upon discharge with follow-up OT services at discharge.   Follow Up Recommendations  SNF    Equipment Recommendations       Recommendations for Other Services      Precautions / Restrictions Precautions Precautions: Fall Restrictions LLE Weight Bearing: Partial weight bearing       Mobility                                         Balance                                           ADL either performed or assessed with clinical judgement   ADL Overall ADL's : Needs assistance/impaired     Grooming: Set up;Minimal assistance   Upper Body Bathing: Set up;Minimal assistance   Lower Body Bathing: Set up;Maximal assistance   Upper Body Dressing : Set  up;Minimal assistance   Lower Body Dressing: Set up;Maximal assistance                 General ADL Comments: Pt. education was provided about general reacher use. Pt. is inconsistent with proper reacher use.     Vision       Perception     Praxis      Cognition Arousal/Alertness: Awake/alert Behavior During Therapy: WFL for tasks assessed/performed Overall Cognitive Status: Within Functional Limits for tasks assessed Area of Impairment: Memory;Problem solving                             Problem Solving: Slow processing;Requires tactile cues;Requires verbal cues          Exercises     Shoulder Instructions       General Comments      Pertinent Vitals/ Pain       Pain Assessment: 0-10 Pain Descriptors / Indicators: Aching Pain Intervention(s): Limited activity within patient's tolerance;Monitored during session  Home Living  Prior Functioning/Environment              Frequency  Min 2X/week        Progress Toward Goals  OT Goals(current goals can now be found in the care plan section)  Progress towards OT goals: Progressing toward goals  Acute Rehab OT Goals Patient Stated Goal: To get better OT Goal Formulation: With patient Potential to Achieve Goals: Good  Plan      Co-evaluation                 AM-PAC OT "6 Clicks" Daily Activity     Outcome Measure   Help from another person eating meals?: A Little Help from another person taking care of personal grooming?: A Little Help from another person toileting, which includes using toliet, bedpan, or urinal?: A Lot Help from another person bathing (including washing, rinsing, drying)?: A Lot Help from another person to put on and taking off regular upper body clothing?: A Little Help from another person to put on and taking off regular lower body clothing?: A Lot 6 Click Score: 15    End of Session    OT  Visit Diagnosis: Unsteadiness on feet (R26.81);Muscle weakness (generalized) (M62.81);History of falling (Z91.81) Pain - Right/Left: Left Pain - part of body: Hip   Activity Tolerance Patient tolerated treatment well   Patient Left in bed   Nurse Communication          Time: 1445-1500 OT Time Calculation (min): 15 min  Charges: OT General Charges $OT Visit: 1 Visit OT Treatments $Self Care/Home Management : 8-22 mins  Harrel Carina, MS, OTR/L   Harrel Carina 12/19/2018, 3:13 PM

## 2018-12-19 NOTE — Progress Notes (Signed)
Patient continued to call out in pain most of the night but would go to sleep easily once PRN pain meds admin. Pt states pain is 10/10 when asked but is laying in bed, calmly resting, with knees bent. Patient was advised and educated on keeping her leg straight but refuses to comply. VS stable, A&Ox2, will continue to monitor.

## 2018-12-19 NOTE — Progress Notes (Signed)
SLP Cancellation Note  Patient Details Name: Jill Hancock MRN: [id number] DOB: 03/04/1941   Cancelled treatment:       Reason Eval/Treat Not Completed: SLP screened, no needs identified, will sign off(Pt is at her baseline) Chart reviewed: patient has baseline vascular dementia/depression per her PCP. Patient was at [facility] until 12/16/2018, there was no indication that she was receiving speech therapy.  Per TOC note the patient is at her communication baseline.  Patient interview: the patient's speech is low volume but intelligible.  She is able to increase volume on request.  She is able to answer basic biographic questions.    Leroy Sea, MS/CCC- SLP  Lou Miner 12/19/2018, 11:51 AM

## 2018-12-19 NOTE — Progress Notes (Addendum)
Physical Therapy Treatment Patient Details Name: Jill Hancock MRN: [id number] DOB: 1940-10-06 Today's Date: 12/19/2018    History of Present Illness Pt. is a 78 y.o. female who was admitted to [facility] with worsening L LE pain, progressive weakness. 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 Pt. also has a new periprosthetic incomplete Hip Fx s/p ORIF IM nailing of the left intertrochanteric Hip Fx. Pt. had been in SNF at [facility] for therapy following her initial hip fracture, and was admitted to the hospital 1 day after discharging home from the SNF.    PT Comments    Pt in bed, agrees to get up.  To edge of bed with min a x 1.  Stood with mod a x 1 and was able to transfer to chair at bedside but limited by pain.  Stood a second attempt but was unable to ambulate due to pain and post LOB requiring mod a to remain upright. Verbal and tactile cues for hand placements for transitions.  Seated exercises x 10 as below.     Follow Up Recommendations  SNF     Equipment Recommendations  Rolling walker with 5" wheels;3in1 (PT)    Recommendations for Other Services       Precautions / Restrictions Precautions Precautions: Fall Restrictions Weight Bearing Restrictions: No LLE Weight Bearing: Partial weight bearing    Mobility  Bed Mobility Overal bed mobility: Needs Assistance Bed Mobility: Supine to Sit     Supine to sit: Min assist        Transfers   Equipment used: Rolling walker (2 wheeled) Transfers: Sit to/from Stand Sit to Stand: Mod assist            Ambulation/Gait Ambulation/Gait assistance: Min assist;Mod assist;+2 safety/equipment Gait Distance (Feet): 3 Feet Assistive device: Rolling walker (2 wheeled) Gait Pattern/deviations: Step-to pattern;Narrow base of support Gait velocity: decreased   General Gait Details: limited gait today due to pain and post  lean/LOB   Stairs             Wheelchair Mobility    Modified Rankin (Stroke Patients Only)       Balance Overall balance assessment: Needs assistance Sitting-balance support: No upper extremity supported;Feet supported Sitting balance-Leahy Scale: Fair     Standing balance support: Bilateral upper extremity supported Standing balance-Leahy Scale: Poor Standing balance comment: Post LOB, unable to remain upright without mod assist.                            Cognition Arousal/Alertness: Awake/alert Behavior During Therapy: WFL for tasks assessed/performed Overall Cognitive Status: Within Functional Limits for tasks assessed                                 General Comments: pleasant and cooperative, following commands; improved expressive speech compared to previous admission      Exercises Other Exercises Other Exercises: seated AROM for LAQ and marches x 10    General Comments        Pertinent Vitals/Pain Pain Assessment: Faces Faces Pain Scale: Hurts even more Pain Location: L hip with movement Pain Descriptors / Indicators: Aching;Grimacing;Guarding Pain Intervention(s): Limited activity within patient's tolerance;Monitored during session;Repositioned    Home Living  Prior Function            PT Goals (current goals can now be found in the care plan section) Progress towards PT goals: Progressing toward goals    Frequency    7X/week      PT Plan Current plan remains appropriate    Co-evaluation              AM-PAC PT "6 Clicks" Mobility   Outcome Measure  Help needed turning from your back to your side while in a flat bed without using bedrails?: A Little Help needed moving from lying on your back to sitting on the side of a flat bed without using bedrails?: A Little Help needed moving to and from a bed to a chair (including a wheelchair)?: A Little Help needed standing up  from a chair using your arms (e.g., wheelchair or bedside chair)?: A Lot Help needed to walk in hospital room?: A Lot Help needed climbing 3-5 steps with a railing? : Total 6 Click Score: 14    End of Session Equipment Utilized During Treatment: Gait belt Activity Tolerance: Patient tolerated treatment well Patient left: in chair;with call bell/phone within reach;with chair alarm set Nurse Communication: Mobility status Pain - Right/Left: Left Pain - part of body: Hip     Time: [id number] PT Time Calculation (min) (ACUTE ONLY): 11 min  Charges:  $Therapeutic Activity: 8-22 mins                     Danielle DessSarah Stanislaus Kaltenbach, PTA 12/19/18, 10:53 AM

## 2018-12-19 NOTE — Progress Notes (Addendum)
[facility] [place] [facility]   PATIENT NAME: Jill Hancock    MR#:  [id number]  DATE OF BIRTH:  [birth year]  SUBJECTIVE:  CHIEF COMPLAINT:   Chief Complaint  Patient presents with  . Hip Pain   -Still has left hip pain.  No right-sided weakness noted on exam today. -Confused.  Sitting in a chair today  REVIEW OF SYSTEMS:  Review of Systems  Constitutional: Positive for malaise/fatigue. Negative for chills and fever.  HENT: Negative for ear discharge, hearing loss and nosebleeds.   Eyes: Negative for blurred vision and double vision.  Respiratory: Negative for cough, shortness of breath and wheezing.   Cardiovascular: Positive for leg swelling. Negative for chest pain and palpitations.  Gastrointestinal: Negative for abdominal pain, constipation, diarrhea, nausea and vomiting.  Genitourinary: Negative for dysuria.  Musculoskeletal: Positive for joint pain and myalgias.  Neurological: Negative for dizziness, focal weakness, seizures, weakness and headaches.  Psychiatric/Behavioral: Negative for depression.    DRUG ALLERGIES:   Allergies  Allergen Reactions  . Demerol [Meperidine] Nausea And Vomiting  . Macrobid [Nitrofurantoin Macrocrystal] Rash  . Penicillins Rash    Has patient had a PCN reaction causing immediate rash, facial/tongue/throat swelling, SOB or lightheadedness with hypotension: Unknown Has patient had a PCN reaction causing severe rash involving mucus membranes or skin necrosis: Unknown Has patient had a PCN reaction that required hospitalization: Unknown Has patient had a PCN reaction occurring within the last 10 years: No If all of the above answers are "NO", then may proceed with Cephalosporin use.     VITALS:  Blood pressure 137/74, pulse 72, temperature 98 F (36.7 C), temperature source Oral, resp. rate 16, height 5\' 2"  (1.575 m), weight 69.9 kg, SpO2 90 %.  PHYSICAL EXAMINATION:  Physical Exam   GENERAL:  78  y.o.-year-old patient sitting in the bed with no acute distress.  EYES: Pupils equal, round, reactive to light and accommodation. No scleral icterus. Extraocular muscles intact.  HEENT: Head atraumatic, normocephalic. Oropharynx and nasopharynx clear.  NECK:  Supple, no jugular venous distention. No thyroid enlargement, no tenderness.  LUNGS: Normal breath sounds bilaterally, no wheezing, rales,rhonchi or crepitation. No use of accessory muscles of respiration. Decreased bibasilar breath sounds CARDIOVASCULAR: S1, S2 normal. No  rubs, or gallops. 2/6 systolic murmur present ABDOMEN: Soft, nontender, nondistended. Bowel sounds present. No organomegaly or mass.  EXTREMITIES: No pedal edema, cyanosis, or clubbing.  NEUROLOGIC: Cranial nerves II through XII are intact. Muscle strength equal in both upper extremities.  Much weaker in both lower extremities especially left leg due to pain.  Sensation intact. Gait not checked.  PSYCHIATRIC: The patient is alert and oriented x 1-2 SKIN: No obvious rash, lesion, or ulcer.    LABORATORY PANEL:   CBC Recent Labs  Lab 12/17/18 1352  WBC 8.6  HGB 11.2*  HCT 35.1*  PLT 456*   ------------------------------------------------------------------------------------------------------------------  Chemistries  Recent Labs  Lab 12/19/18 0645  NA 138  K 3.6  CL 101  CO2 25  GLUCOSE 102*  BUN 8  CREATININE 0.71  CALCIUM 8.9   ------------------------------------------------------------------------------------------------------------------  Cardiac Enzymes Recent Labs  Lab 12/17/18 1718  TROPONINI 0.06*   ------------------------------------------------------------------------------------------------------------------  RADIOLOGY:  Ct Head Wo Contrast  Result Date: 12/17/2018 CLINICAL DATA:  History of stroke EXAM: CT HEAD WITHOUT CONTRAST TECHNIQUE: Contiguous axial images were obtained from the base of the skull through the vertex without  intravenous contrast. COMPARISON:  11/22/2018 FINDINGS: Brain: No evidence of acute infarction, hemorrhage, hydrocephalus,  extra-axial collection or mass lesion/mass effect. There is extensive, confluent periventricular and deep white matter hypodensity of the bilateral cerebral hemispheres Vascular: No hyperdense vessel or unexpected calcification. Skull: Normal. Negative for fracture or focal lesion. Sinuses/Orbits: No acute finding. Other: None. IMPRESSION: No acute intracranial pathology. There is extensive, confluent periventricular and deep white matter hypodensity of the bilateral cerebral hemispheres, consistent with advanced small-vessel white matter disease and prior infarctions. Consider MRI to more sensitively evaluate for acutely superimposed diffusion restricting infarction in this background. Electronically Signed   By: Lauralyn PrimesAlex  Bibbey M.D.   On: 12/17/2018 14:48   Mr Brain Wo Contrast  Result Date: 12/17/2018 CLINICAL DATA:  Right lower extremity weakness EXAM: MRI HEAD WITHOUT CONTRAST TECHNIQUE: Multiplanar, multiecho pulse sequences of the brain and surrounding structures were obtained without intravenous contrast. COMPARISON:  Head CT 12/17/2018 FINDINGS: BRAIN: Punctate focus of abnormal diffusion restriction within the left pons. There is also blooming in this area on the T2-weighted gradient echo sequence. No other diffusion abnormality. The midline structures are normal. There are old infarcts of the right cerebellum, right pons and both deep gray nuclei. Diffuse confluent hyperintense T2-weighted signal within the periventricular, deep and juxtacortical white matter, most commonly due to chronic ischemic microangiopathy. The cerebral and cerebellar volume are age-appropriate. No hydrocephalus. Multiple chronic microhemmorhages in a predominantly central distribution. No mass lesion. VASCULAR: The major intracranial arterial and venous sinus flow voids are normal. SKULL AND UPPER CERVICAL  SPINE: Calvarial bone marrow signal is normal. There is no skull base mass. Visualized upper cervical spine and soft tissues are normal. SINUSES/ORBITS: No fluid levels or advanced mucosal thickening. No mastoid or middle ear effusion. The orbits are normal. IMPRESSION: 1. Punctate focus of abnormal diffusion restriction within the left pons with associated magnetic susceptibility effect on T2*-weighted imaging. This could represent a small acute infarct, as this location is in keeping with the reported right lower extremity weakness. However this could also be a susceptibility artifact due to chronic blood products. 2. 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 3. Numerous chronic microhemorrhages, predominantly central, most consistent with chronic hypertensive angiopathy. Electronically Signed   By: Deatra RobinsonKevin  Herman M.D.   On: 12/17/2018 17:16   [place] Carotid Bilateral (at [facility] Only)  Result Date: 12/18/2018 CLINICAL DATA:  Hypertension, syncope, hyperlipidemia EXAM: BILATERAL CAROTID DUPLEX ULTRASOUND TECHNIQUE: Wallace CullensGray scale imaging, color Doppler and duplex ultrasound were performed of bilateral carotid and vertebral arteries in the neck. COMPARISON:  None. FINDINGS: Criteria: Quantification of carotid stenosis is based on velocity parameters that correlate the residual internal carotid diameter with NASCET-based stenosis levels, using the diameter of the distal internal carotid lumen as the denominator for stenosis measurement. The following velocity measurements were obtained: RIGHT ICA: 47/14 cm/sec CCA: 45/10 cm/sec SYSTOLIC ICA/CCA RATIO:  1.0 ECA: 109 cm/sec LEFT ICA: 57/19 cm/sec CCA: 68/13 cm/sec SYSTOLIC ICA/CCA RATIO:  0.8 ECA: 71 cm/sec RIGHT CAROTID ARTERY: Minor echogenic shadowing plaque formation. No hemodynamically significant right ICA stenosis, velocity elevation, or turbulent flow. Degree of narrowing less than 50%. RIGHT VERTEBRAL ARTERY:  Antegrade  LEFT CAROTID ARTERY: Similar scattered minor echogenic plaque formation. No hemodynamically significant left ICA stenosis, velocity elevation, or turbulent flow. LEFT VERTEBRAL ARTERY:  Antegrade IMPRESSION: Minor carotid atherosclerosis. No hemodynamically significant ICA stenosis. Degree of narrowing less than 50% bilaterally by ultrasound criteria. Patent antegrade vertebral flow bilaterally Electronically Signed   By: Judie PetitM.  Shick M.D.   On: 12/18/2018 11:07   Dg Chest Portable  1 View  Result Date: 12/17/2018 CLINICAL DATA:  Pain EXAM: PORTABLE CHEST 1 VIEW COMPARISON:  11/21/2018 FINDINGS: The heart size is stable. No pneumothorax. No large pleural effusion. There is likely some atelectasis versus scarring at the lung bases. No acute osseous abnormality. IMPRESSION: No active disease. Electronically Signed   By: Constance Holster M.D.   On: 12/17/2018 15:08   Dg Hip Unilat W Or Wo Pelvis 2-3 Views Left  Result Date: 12/17/2018 CLINICAL DATA:  The patient suffered a left intertrochanteric fracture in a fall 11/21/2018 and underwent fixation that same day. Left hip pain today. No new injury. Subsequent encounter. EXAM: DG HIP (WITH OR WITHOUT PELVIS) 2-3V LEFT COMPARISON:  Plain films left hip 11/21/2018. FINDINGS: Hip screw and short intramedullary nail are in place for fixation of a left intertrochanteric fracture. Hardware is intact. Position and alignment are unchanged. A cortical lucency is seen in the anterior and lateral cortex of the proximal diaphysis of the femur consistent with a periprosthetic fracture along the midportion of the intramedullary nail. This is not visible on the prior exam. IMPRESSION: Incomplete, nondisplaced diaphyseal fracture along the midportion of the patient's intramedullary nail is not visible on the immediate postoperative films. Left intertrochanteric fracture without change in position or alignment. Electronically Signed   By: Inge Rise M.D.   On: 12/17/2018  15:10    EKG:   Orders placed or performed during the hospital encounter of 12/17/18  . ED EKG  . ED EKG  . EKG 12-Lead  . EKG 12-Lead    ASSESSMENT AND PLAN:   78 year old female with depression and anxiety, cognitive deficit, prior stroke, vertigo recent fall and left hip fracture was brought in from home secondary to weakness and confusion.  1.  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  Patient admitted with right leg weakness.   -No right leg weakness currently.  Symptoms have much resolved.  Exam is limited as patient cannot completely bear weight while standing due to left periprosthetic fracture. -Prior infarction noted, and significant microvascular ischemic disease changes.  Concern for underlying vascular dementia as well. -Continue aspirin -Appreciate neuro consult.  Carotid Dopplers with minor atherosclerosis, no hemodynamically significant stenosis noted.  And echo are with no interatrial shunt.  EF is 60 to 65% -PT and OT consults recommended rehab  2.  Left periprosthetic hip fracture-patient was here 5 weeks ago for left hip intertrochanteric fracture and had intramedullary nail placement by Ortho.  Was discharged to rehab facility.  Family denies any further falls. -Patient now complaining of left leg pain -Repeat x-rays now showing a new incomplete subtrochanteric hip fracture of the left hip. -Ortho reconsulted.  Nonsurgical management at this time.  Continue pain control and physical therapy. -Partial weightbearing as tolerated on the left side  3.  Hypertension-on Norvasc, lisinopril  4.  DVT prophylaxis-Lovenox  Possible discharge to rehab tomorrow Updated daughter again over the phone today   All the records are reviewed and case discussed with Care Management/Social Workerr. Management plans discussed with the patient, family and they are in agreement.  CODE STATUS: DNR  TOTAL TIME TAKING CARE OF THIS PATIENT: 39  minutes.   POSSIBLE D/C IN 2 DAYS, DEPENDING ON CLINICAL CONDITION.   Gladstone Lighter M.D on 12/19/2018 at 1:01 PM  Between 7am to 6pm - Pager - [phone]  After 6pm go to [email or web address] - password [email or web address] [place] [facility]  Office  [phone]  CC: Primary care physician; Nobie Putnam  J, DO

## 2018-12-19 NOTE — TOC Progression Note (Signed)
Transition of Care [facility]) - Progression Note    Patient Details  Name: Jill Hancock MRN: [id number] Date of Birth: 08-20-1940  Transition of [facility]) CM/SW Contact  Shelbie Hutching, RN Phone Number: 12/19/2018, 11:59 AM  Clinical Narrative:    RNCM spoke with patient's daughter Aura Dials voices that she would like for her mother to go back to rehab for another 21 days.  Cecille Rubin is still concerned about the cost of SNF and long term care.  RNCM instructed daughter to contact DSS for more information on long term Medicaid.  Informed daughter that the patient would need to turn over any money that comes in monthly to qualify for the long term Medicaid.  Cecille Rubin does not want patient to return to [facility] but is requesting [facility].  Request for bed sent through the hub to [facility] with PT notes.  RNCM will cont to follow and update daughter as needed.   Expected Discharge Plan: Skilled Nursing Facility Barriers to Discharge: Continued Medical Work up  Expected Discharge Plan and Services Expected Discharge Plan: [place]   Discharge Planning Services: CM Consult Post Acute Care Choice: Home Health   Expected Discharge Date: 12/20/18                         HH Arranged: RN, PT, OT HH Agency: [place] ([place]) Date HH Agency Contacted: 12/17/18 Time [place]: [place] Representative spoke with at [place]: [place] Determinants of Health ([place]) Interventions    Readmission Risk Interventions No flowsheet data found.

## 2018-12-19 NOTE — TOC Progression Note (Signed)
Transition of Care [facility]) - Progression Note    Patient Details  Name: Jill Hancock MRN: [id number] Date of Birth: July 07, 1940  Transition of Care [facility]) CM/SW Contact  Shelbie Hutching, RN Phone Number: 12/19/2018, 4:30 PM  Clinical Narrative:    RNCM spoke with patient's daughter today about plan of care.  Daughter is requesting for patient to go to rehab.  Daughter prefers [facility] and does not have a second choice.  [facility] has declined to offer a bed.  RNCM will update daughter tomorrow.  Bed search expanded.    Expected Discharge Plan: Skilled Nursing Facility Barriers to Discharge: Continued Medical Work up  Expected Discharge Plan and Services Expected Discharge Plan: [place]   Discharge Planning Services: CM Consult Post Acute Care Choice: Home Health   Expected Discharge Date: 12/20/18                         HH Arranged: RN, PT, OT HH Agency: [place] ([place]) Date HH Agency Contacted: 12/17/18 Time [place]: [place] Representative spoke with at [place]: [place] Determinants of Health ([place]) Interventions    Readmission Risk Interventions No flowsheet data found.

## 2018-12-19 NOTE — NC FL2 (Signed)
[place] LEVEL OF CARE SCREENING TOOL     IDENTIFICATION  Patient Name: Jill Hancock Birthdate: [birth year] Sex: female Admission Date (Current Location): 12/17/2018  [place] and [place] Number:  Engineering geologist and Address:  [facility], [address], [place], [place] [address]      Provider Number: [id number]  Attending Physician Name and Address:  Gladstone Lighter, MD  Relative Name and Phone Number:       Current Level of Care: Hospital Recommended Level of Care: [place] Prior Approval Number:    Date Approved/Denied:   PASRR Number: [id number]  Discharge Plan: SNF    Current Diagnoses: Patient Active Problem List   Diagnosis Date Noted  . CVA (cerebral vascular accident) ([place]) 12/17/2018  . Closed left hip fracture ([place]) 11/21/2018  . Pre-diabetes 07/25/2018  . Multiple rib fractures 12/31/2017  . Primary osteoarthritis of right knee 12/14/2017  . Dementia, vascular ([place]) 12/04/2017  . Right shoulder injury 11/12/2015  . Urinary urgency 09/17/2015  . Hyperglycemia 07/29/2015  . Restless leg 06/07/2015  . Benign paroxysmal positional vertigo 06/07/2015  . Hyperlipidemia 04/16/2015  . Vitamin D deficiency 08/13/2014  . T12 compression fracture ([place]) 04/28/2014  . 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 Major depression, recurrent, chronic ([place]) 11/24/2012  . GERD (gastroesophageal reflux disease) 11/24/2012  . Osteoporosis 09/05/2012  . Right hip pain 08/23/2012  . Anxiety 05/04/2012  . Hypertension 02/27/2012  . History of cerebrovascular accident (CVA) with residual deficit 02/25/2012  . Unsteady gait 02/25/2012    Orientation RESPIRATION BLADDER Height & Weight     Self, Time  Normal Incontinent Weight: 69.9 kg Height:  5\' 2"  (157.5 cm)  BEHAVIORAL SYMPTOMS/MOOD NEUROLOGICAL BOWEL NUTRITION STATUS      Continent Diet(Heart Healthy carb modified)  AMBULATORY STATUS COMMUNICATION  OF NEEDS Skin   Extensive Assist Verbally Normal, Bruising                       Personal Care Assistance Level of Assistance  Bathing, Dressing, Feeding Bathing Assistance: Limited assistance Feeding assistance: Independent Dressing Assistance: Limited assistance     Functional Limitations Info    Sight Info: Adequate Hearing Info: Adequate Speech Info: Adequate    SPECIAL CARE FACTORS FREQUENCY  PT (By licensed PT), OT (By licensed OT)     PT Frequency: 5 times per week OT Frequency: 5 times per week            Contractures Contractures Info: Not present    Additional Factors Info  Code Status, Allergies Code Status Info: DNR Allergies Info: Demerol, Macrobid, PCN           Current Medications (12/19/2018):  This is the current hospital active medication list Current Facility-Administered Medications  Medication Dose Route Frequency Provider Last Rate Last Dose  . acetaminophen (TYLENOL) tablet 650 mg  650 mg Oral Q4H PRN Fritzi Mandes, MD       Or  . acetaminophen (TYLENOL) solution 650 mg  650 mg Per Tube Q4H PRN Fritzi Mandes, MD       Or  . acetaminophen (TYLENOL) suppository 650 mg  650 mg Rectal Q4H PRN Fritzi Mandes, MD      . acetaminophen (TYLENOL) tablet 500 mg  500 mg Oral TID Fritzi Mandes, MD   500 mg at 12/19/18 0845  . amLODipine (NORVASC) tablet 10 mg  10 mg Oral Daily Fritzi Mandes, MD   10 mg at 12/19/18 0844  .  aspirin EC tablet 81 mg  81 mg Oral Daily Enedina FinnerPatel, Sona, MD   81 mg at 12/19/18 0845  . atorvastatin (LIPITOR) tablet 40 mg  40 mg Oral q1800 Enid BaasKalisetti, Radhika, MD   40 mg at 12/18/18 1615  . cholecalciferol (VITAMIN D) tablet 5,000 Units  5,000 Units Oral Daily Enedina FinnerPatel, Sona, MD   5,000 Units at 12/19/18 0844  . docusate sodium (COLACE) capsule 100 mg  100 mg Oral BID Enedina FinnerPatel, Sona, MD   100 mg at 12/19/18 0845  . enoxaparin (LOVENOX) injection 40 mg  40 mg Subcutaneous Q24H Enedina FinnerPatel, Sona, MD   40 mg at 12/18/18 2041  . feeding supplement (ENSURE  ENLIVE) (ENSURE ENLIVE) liquid 237 mL  237 mL Oral BID BM Enid BaasKalisetti, Radhika, MD   237 mL at 12/19/18 1144  . FLUoxetine (PROZAC) capsule 40 mg  40 mg Oral Daily Enedina FinnerPatel, Sona, MD   40 mg at 12/19/18 0845  . lisinopril (ZESTRIL) tablet 40 mg  40 mg Oral Daily Enedina FinnerPatel, Sona, MD   40 mg at 12/19/18 0845  . LORazepam (ATIVAN) tablet 0.5 mg  0.5 mg Oral QHS PRN Enedina FinnerPatel, Sona, MD   0.5 mg at 12/18/18 2258  . morphine 2 MG/ML injection 2 mg  2 mg Intravenous Q4H PRN Enid BaasKalisetti, Radhika, MD   2 mg at 12/19/18 0855  . multivitamin with minerals tablet 1 tablet  1 tablet Oral Daily Enedina FinnerPatel, Sona, MD   1 tablet at 12/19/18 0845  . pantoprazole (PROTONIX) EC tablet 40 mg  40 mg Oral Daily Enedina FinnerPatel, Sona, MD   40 mg at 12/19/18 0845  . polyethylene glycol (MIRALAX / GLYCOLAX) packet 17 g  17 g Oral Daily PRN Enedina FinnerPatel, Sona, MD      . potassium chloride (K-DUR) CR tablet 10 mEq  10 mEq Oral Daily Enedina FinnerPatel, Sona, MD   10 mEq at 12/19/18 0845  . traMADol (ULTRAM) tablet 50 mg  50 mg Oral Q6H PRN Enid BaasKalisetti, Radhika, MD   50 mg at 12/19/18 1144     Discharge Medications: Please see discharge summary for a list of discharge medications.  Relevant Imaging Results:  Relevant Lab Results:   Additional Information SS # [id number]  Allayne ButcherJeanna M Dominyck Reser, RN

## 2018-12-20 MED ORDER — LORAZEPAM 0.5 MG PO TABS: 0.5000 mg | ORAL_TABLET | Freq: Every evening | ORAL | 0 refills | Status: DC | PRN

## 2018-12-20 MED ORDER — TRAMADOL HCL 50 MG PO TABS: 50.0000 mg | ORAL_TABLET | Freq: Four times a day (QID) | ORAL | 0 refills | Status: DC | PRN

## 2018-12-20 MED ORDER — ENSURE ENLIVE PO LIQD: 237.0000 mL | Freq: Two times a day (BID) | ORAL | 12 refills | Status: DC

## 2018-12-20 MED ORDER — ATORVASTATIN CALCIUM 40 MG PO TABS: 40.0000 mg | ORAL_TABLET | Freq: Every day | ORAL | 2 refills | Status: DC

## 2018-12-20 NOTE — Patient Outreach (Signed)
Sent inbasket message to [employer] LPN of [facility] notifying her of Ms. Elgersma's discharge from [facility] for transition of care services.

## 2018-12-20 NOTE — Progress Notes (Addendum)
Patient's daughters continue to call for updates, Dr. Tressia Miners called family and gave updates. Per MD, Pt will need to stay here tonight, Family unable to take mother home at this time ([place]'re at the beach) DC cancelled. After MD spoke with family, daughter called back to make sure PT is working with her mother.  39  Daughter Jill Hancock back Requesting for a Tramadol RX to [facility] [place] on [address]. MD Tressia Miners made aware.

## 2018-12-20 NOTE — TOC Progression Note (Signed)
Transition of Care [facility]) - Progression Note    Patient Details  Name: Jill Hancock MRN: [id number] Date of Birth: [birth year]  Transition [facility] CM/SW Contact  Shelbie Hutching, RN Phone Number: 12/20/2018, 2:49 PM  Clinical Narrative:     Patient medically stable for discharge today.  Daughter Jill Hancock is currently at the beach, she reports if she had known her mother was going to discharge this weekend she would not have gone.  Patient's other daughter Jill Hancock is also unavailable to take patient home this weekend.  RNCM explained to patient's daughter that if the patient stays until Monday Saturday and Sunday will not be covered by insurance and it will be up to the patient and patient's family to cover the hospital stay.  Jill Hancock reports that she understands this.  [facility] has arranged with Jill Hancock to deliver the bed and lift on Monday sometime after 10 am, once the bed arrives the patient will be transported via EMS.  Patient will not be discharging to her apartment but will go to her daughter Jill Hancock's home at [address] [place].    Expected Discharge Plan: [place] Barriers to Discharge: Family Issues  Expected Discharge Plan and Services Expected Discharge Plan: [place]   Discharge Planning Services: CM Consult Post Acute Care Choice: Home Health   Expected Discharge Date: 12/20/18                         HH Arranged: RN, PT, OT HH Agency: [place] ([facility]) Date HH Agency Contacted: 12/17/18 Time [place]: [place] Representative spoke with at [place]: [place] Determinants of Health ([place]) Interventions    Readmission Risk Interventions No flowsheet data found.

## 2018-12-20 NOTE — TOC Progression Note (Signed)
Transition of Care [facility]) - Progression Note    Patient Details  Name: Jill Hancock MRN: [id number] Date of Birth: 12-03-1940  Transition of Care [facility]) CM/SW Contact  Shelbie Hutching, RN Phone Number: 12/20/2018, 10:04 AM  Clinical Narrative:    Patient is ready for discharge.  RNCM confirmed with daughter Jill Hancock this morning that they cannot afford for patient to go to SNF while she is in her copay days.  Plan is for patient to return to her home with home health.  Patient will need a hospital bed and lift for home use.  Daughter Jill Hancock and Jill Hancock will be the primary care givers.  Home Health will be set up with [place] for RN, PT, OT, Aide, and SW.  Jill Hancock would like to know what the copay will be for the equipment.  [facility] will provide equipment and run through insurance for price.  Patient already has a bedside commode, wheelchair and rolling walker.    Expected Discharge Plan: Skilled Nursing Facility Barriers to Discharge: Continued Medical Work up  Expected Discharge Plan and Services Expected Discharge Plan: [place]   Discharge Planning Services: CM Consult Post Acute Care Choice: Home Health   Expected Discharge Date: 12/20/18                         HH Arranged: RN, PT, OT HH Agency: [place] ([place]) Date HH Agency Contacted: 12/17/18 Time [place]: [place] Representative spoke with at [place]: [place] Determinants of Health ([place]) Interventions    Readmission Risk Interventions No flowsheet data found.

## 2018-12-20 NOTE — Progress Notes (Addendum)
Patient suffers from CVA which requires repositioning in ways not feasible in a standard bed.  Patient requires head of bed to be elevated 30 degrees to avoid severe pain.   Agree with above note.- Dr. Gladstone Lighter, M.D

## 2018-12-20 NOTE — Progress Notes (Signed)
Physical Therapy Treatment Patient Details Name: Jill Hancock MRN: [id number] DOB: 08/24/1940 Today's Date: 12/20/2018    History of Present Illness Pt. is a 78 y.o. female who was admitted to [facility] with worsening L LE pain, progressive weakness. 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 Pt. also has a new periprosthetic incomplete Hip Fx s/p ORIF IM nailing of the left intertrochanteric Hip Fx. Pt. had been in [facility]F at [facility] for therapy following her initial hip fracture, and was admitted to the hospital 1 day after discharging home from the SNF.    PT Comments    Pt in bed, asking for pain medication upon arrival.  Nurse called and requested.  Participated in exercises as described below.  To edge of bed with min a x 1.  Stood to walker with mod a x 1 and heavy post lean.  Pt unable to step today possibly attributed to pain.  Walker removed and stand pivot to recliner at bedside with mod a x 1.  Remained in chair after session.     Follow Up Recommendations  SNF     Equipment Recommendations  Rolling walker with 5" wheels;3in1 (PT)    Recommendations for Other Services       Precautions / Restrictions Precautions Precautions: Fall Restrictions Weight Bearing Restrictions: Yes LLE Weight Bearing: Partial weight bearing    Mobility  Bed Mobility Overal bed mobility: Needs Assistance Bed Mobility: Supine to Sit     Supine to sit: Min assist        Transfers Overall transfer level: Needs assistance Equipment used: Rolling walker (2 wheeled);None Transfers: Risk manager;Sit to/from Stand Sit to Stand: Mod assist         General transfer comment: difficulty moving feet this session, walker removed and stand pivot to chair used,  Ambulation/Gait             General Gait Details: unable to day   Stairs             Wheelchair Mobility     Modified Rankin (Stroke Patients Only)       Balance Overall balance assessment: Needs assistance   Sitting balance-Leahy Scale: Fair     Standing balance support: Bilateral upper extremity supported Standing balance-Leahy Scale: Poor Standing balance comment: Post LOB, unable to remain upright without mod assist.                            Cognition Arousal/Alertness: Awake/alert Behavior During Therapy: WFL for tasks assessed/performed Overall Cognitive Status: Within Functional Limits for tasks assessed                                        Exercises Other Exercises Other Exercises: supine AAROM for ankle pumps, heel slides, ab/add and SLR x 10 BLE    General Comments        Pertinent Vitals/Pain Pain Assessment: Faces Faces Pain Scale: Hurts even more Pain Location: L hip - requesting pain meds Pain Descriptors / Indicators: Aching Pain Intervention(s): Monitored during session;Limited activity within patient's tolerance;Patient requesting pain meds-RN notified    Home Living                      Prior Function  PT Goals (current goals can now be found in the care plan section) Progress towards PT goals: Progressing toward goals    Frequency    7X/week      PT Plan Current plan remains appropriate    Co-evaluation              AM-PAC PT "6 Clicks" Mobility   Outcome Measure  Help needed turning from your back to your side while in a flat bed without using bedrails?: A Little Help needed moving from lying on your back to sitting on the side of a flat bed without using bedrails?: A Little Help needed moving to and from a bed to a chair (including a wheelchair)?: A Little Help needed standing up from a chair using your arms (e.g., wheelchair or bedside chair)?: A Lot Help needed to walk in hospital room?: Total Help needed climbing 3-5 steps with a railing? : Total 6 Click Score: 13    End  of Session Equipment Utilized During Treatment: Gait belt Activity Tolerance: Patient limited by pain Patient left: in chair;with call bell/phone within reach;with chair alarm set Nurse Communication: Other (comment) Pain - Right/Left: Left Pain - part of body: Hip     Time: [id number] PT Time Calculation (min) (ACUTE ONLY): 19 min  Charges:  $Therapeutic Exercise: 8-22 mins                     Danielle DessSarah Iyannah Blake, PTA 12/20/18, 3:45 PM

## 2018-12-20 NOTE — Care Management Important Message (Signed)
Important Message  Patient Details  Name: Jill Hancock MRN: [id number] Date of Birth: [birth year]   Medicare Important Message Given:  Yes  Reviewed verbally with [name], daughter, at [phone].  Aware of right.  Copy of Medicare IM sent securely to daughter's email: [email or web address].   [name] 12/20/2018, 11:32 AM

## 2018-12-20 NOTE — Progress Notes (Signed)
[place] at [place] NAME: Jill Hancock    MR#:  [id number]  DATE OF BIRTH:  Aug 22, 1940  SUBJECTIVE:  CHIEF COMPLAINT:   Chief Complaint  Patient presents with   Hip Pain   -Still has left hip pain. No complaints otherwise - plan for discharge today  REVIEW OF SYSTEMS:  Review of Systems  Constitutional: Positive for malaise/fatigue. Negative for chills and fever.  HENT: Negative for ear discharge, hearing loss and nosebleeds.   Eyes: Negative for blurred vision and double vision.  Respiratory: Negative for cough, shortness of breath and wheezing.   Cardiovascular: Positive for leg swelling. Negative for chest pain and palpitations.  Gastrointestinal: Negative for abdominal pain, constipation, diarrhea, nausea and vomiting.  Genitourinary: Negative for dysuria.  Musculoskeletal: Positive for joint pain and myalgias.  Neurological: Negative for dizziness, focal weakness, seizures, weakness and headaches.  Psychiatric/Behavioral: Negative for depression.    DRUG ALLERGIES:   Allergies  Allergen Reactions   Demerol [Meperidine] Nausea And Vomiting   Macrobid [Nitrofurantoin Macrocrystal] Rash   Penicillins Rash    Has patient had a PCN reaction causing immediate rash, facial/tongue/throat swelling, SOB or lightheadedness with hypotension: Unknown Has patient had a PCN reaction causing severe rash involving mucus membranes or skin necrosis: Unknown Has patient had a PCN reaction that required hospitalization: Unknown Has patient had a PCN reaction occurring within the last 10 years: No If all of the above answers are "NO", then may proceed with Cephalosporin use.     VITALS:  Blood pressure 122/76, pulse 85, temperature (!) 97.5 F (36.4 C), temperature source Oral, resp. rate 17, height 5\' 2"  (1.575 m), weight 69.9 kg, SpO2 94 %.  PHYSICAL EXAMINATION:  Physical Exam   GENERAL:  78 y.o.-year-old patient sitting in  the bed with no acute distress.  EYES: Pupils equal, round, reactive to light and accommodation. No scleral icterus. Extraocular muscles intact.  HEENT: Head atraumatic, normocephalic. Oropharynx and nasopharynx clear.  NECK:  Supple, no jugular venous distention. No thyroid enlargement, no tenderness.  LUNGS: Normal breath sounds bilaterally, no wheezing, rales,rhonchi or crepitation. No use of accessory muscles of respiration. Decreased bibasilar breath sounds CARDIOVASCULAR: S1, S2 normal. No  rubs, or gallops. 2/6 systolic murmur present ABDOMEN: Soft, nontender, nondistended. Bowel sounds present. No organomegaly or mass.  EXTREMITIES: No pedal edema, cyanosis, or clubbing.  NEUROLOGIC: Cranial nerves II through XII are intact. Muscle strength equal in both upper extremities.  Much weaker in both lower extremities especially left leg due to pain.  Sensation intact. Gait not checked.  PSYCHIATRIC: The patient is alert and oriented x 1-2 SKIN: No obvious rash, lesion, or ulcer.    LABORATORY PANEL:   CBC Recent Labs  Lab 12/17/18 1352  WBC 8.6  HGB 11.2*  HCT 35.1*  PLT 456*   ------------------------------------------------------------------------------------------------------------------  Chemistries  Recent Labs  Lab 12/19/18 0645  NA 138  K 3.6  CL 101  CO2 25  GLUCOSE 102*  BUN 8  CREATININE 0.71  CALCIUM 8.9   ------------------------------------------------------------------------------------------------------------------  Cardiac Enzymes Recent Labs  Lab 12/17/18 1718  TROPONINI 0.06*   ------------------------------------------------------------------------------------------------------------------  RADIOLOGY:  US Carotid Bilateral (at [facility] Only)  Result Date: 12/18/2018 CLINICAL DATA:  Hypertension, syncope, hyperlipidemia EXAM: BILATERAL CAROTID DUPLEX ULTRASOUND TECHNIQUE: Pearline Cables scale imaging, color Doppler and duplex ultrasound were performed  of bilateral carotid and vertebral arteries in the neck. COMPARISON:  None. FINDINGS: Criteria: Quantification of carotid stenosis is based on  velocity parameters that correlate the residual internal carotid diameter with NASCET-based stenosis levels, using the diameter of the distal internal carotid lumen as the denominator for stenosis measurement. The following velocity measurements were obtained: RIGHT ICA: 47/14 cm/sec CCA: 45/10 cm/sec SYSTOLIC ICA/CCA RATIO:  1.0 ECA: 109 cm/sec LEFT ICA: 57/19 cm/sec CCA: 68/13 cm/sec SYSTOLIC ICA/CCA RATIO:  0.8 ECA: 71 cm/sec RIGHT CAROTID ARTERY: Minor echogenic shadowing plaque formation. No hemodynamically significant right ICA stenosis, velocity elevation, or turbulent flow. Degree of narrowing less than 50%. RIGHT VERTEBRAL ARTERY:  Antegrade LEFT CAROTID ARTERY: Similar scattered minor echogenic plaque formation. No hemodynamically significant left ICA stenosis, velocity elevation, or turbulent flow. LEFT VERTEBRAL ARTERY:  Antegrade IMPRESSION: Minor carotid atherosclerosis. No hemodynamically significant ICA stenosis. Degree of narrowing less than 50% bilaterally by ultrasound criteria. Patent antegrade vertebral flow bilaterally Electronically Signed   By: Judie PetitM.  Shick M.D.   On: 12/18/2018 11:07    EKG:   Orders placed or performed during the hospital encounter of 12/17/18   ED EKG   ED EKG   EKG 12-Lead   EKG 12-Lead    ASSESSMENT AND PLAN:   78 year old female with depression and anxiety, cognitive deficit, prior stroke, vertigo recent fall and left hip fracture was brought in from home secondary to weakness and confusion.  1.  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  Patient admitted with right leg weakness.   -No right leg weakness currently.  Symptoms have much resolved.  Exam is limited as patient cannot completely bear weight while standing due to left periprosthetic fracture. -Prior infarction noted, and  significant microvascular ischemic disease changes.  Concern for underlying vascular dementia as well. -Continue aspirin -Appreciate neuro consult.  Carotid Dopplers with minor atherosclerosis, no hemodynamically significant stenosis noted.  And echo are with no interatrial shunt.  EF is 60 to 65% -PT and OT consults recommended rehab-however patient has used her rehab days and does not have any bed offers at this time.  Will be discharged home.  2.  Left periprosthetic hip fracture-patient was here 5 weeks ago for left hip intertrochanteric fracture and had intramedullary nail placement by Ortho.  Was discharged to rehab facility.  Family denies any further falls. -Patient now complaining of left leg pain -Repeat x-rays now showing a new incomplete subtrochanteric hip fracture of the left hip. -Ortho reconsulted.  Nonsurgical management at this time.  Continue pain control and physical therapy. -Partial weightbearing as tolerated on the left side  3.  Hypertension-on Norvasc, lisinopril  4.  DVT prophylaxis-Lovenox  Due to inability to go to rehab, use defer rehab days and no bed offers-family will be taking her home with home health services.  All equipment will be ordered -Palliative care at discharge.  Will be high risk for readmission.   All the records are reviewed and case discussed with Care Management/Social Workerr. Management plans discussed with the patient, family and they are in agreement.  CODE STATUS: DNR  TOTAL TIME TAKING CARE OF THIS PATIENT: 38 minutes.   POSSIBLE D/C IN 2 DAYS, DEPENDING ON CLINICAL CONDITION.   Enid Baasadhika Zipporah Finamore M.D on 12/20/2018 at 10:30 AM  Between 7am to 6pm - Pager - [phone]  After 6pm go to [email or web address] - Social research officer, governmentpassword EPAS [facility]  [facility]  Office  [phone]  CC: Primary care physician; Smitty CordsKaramalegos, Alexander J, DO

## 2018-12-21 MED ORDER — CLOPIDOGREL BISULFATE 75 MG PO TABS: 75.0000 mg | ORAL_TABLET | Freq: Every day | ORAL | 2 refills | Status: DC

## 2018-12-21 MED ORDER — CLOPIDOGREL BISULFATE 75 MG PO TABS
75.0000 mg | ORAL_TABLET | Freq: Every day | ORAL | Status: DC
  Administered 2018-12-21 – 2018-12-23 (×3): 75 mg via ORAL
  Filled 2018-12-21 (×3): qty 1

## 2018-12-21 MED ORDER — HALOPERIDOL LACTATE 5 MG/ML IJ SOLN
0.5000 mg | Freq: Once | INTRAMUSCULAR | Status: AC
  Administered 2018-12-21: 16:00:00 0.5 mg via INTRAMUSCULAR
  Filled 2018-12-21: qty 1

## 2018-12-21 NOTE — Progress Notes (Signed)
Physical Therapy Treatment Patient Details Name: Jill CockerLinda S Hancock MRN: [id number] DOB: 09/15/1940 Today's Date: 12/21/2018    History of Present Illness Pt. is a 78 y.o. female who was [facility] with worsening L LE pain, progressive weakness. 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 Pt. also has a new periprosthetic incomplete Hip Fx s/p ORIF IM nailing of the left intertrochanteric Hip Fx. Pt. had been in SNF at [facility] for therapy following her initial hip fracture, and was admitted to the hospital 1 day after discharging home from the SNF.    PT Comments    Pt in bed, moaning in pain.  Pt agreed to getting up to recliner for breakfast.  RN notified by tech regarding request for pain medication.  To edge of bed with min a x 1 and good initiation of movement.  She is able to sit unsupported but is unsafe to be left unattended in sitting.  Stood with walker and mod a x 1 for 2 attempts but she is unable to transfer safely to chair with +1 assist and walker.  Pt returned to sitting and stand pivot transfer to chair at bedside with mod a x 1.  Sitting AROM as below but limited by discomfort.  Daughter Lawson FiscalLori called room during session.  Lawson FiscalLori will be primary caregiver upon discharge to her home Monday.  Discussed at length pt's mobility status and assist levels.  Per Lawson FiscalLori, pt with discharge with hospital bed, 3-in 1 commode, lift, wheelchair and walker.  HHPT will also be involved.  Answered questions as appropriate. While pt would continue to benefit from SNF, family is able to care for her at this time.  Pt given phone at end of conversation to talk with her daughter.  Tech updated on mobility skills.   Follow Up Recommendations  SNF - see above.     Equipment Recommendations  Rolling walker with 5" wheels;3in1 (PT)    Recommendations for Other Services       Precautions /  Restrictions Precautions Precautions: Fall Restrictions Weight Bearing Restrictions: Yes LLE Weight Bearing: Partial weight bearing    Mobility  Bed Mobility Overal bed mobility: Needs Assistance Bed Mobility: Supine to Sit     Supine to sit: Min assist        Transfers Overall transfer level: Needs assistance Equipment used: Rolling walker (2 wheeled);None Transfers: Sit to/from [employer]Stand;Stand Pivot Transfers Sit to Stand: Mod assist Stand pivot transfers: Mod assist;Max assist;+2 physical assistance       General transfer comment: difficulty moving feet this session, walker removed and stand pivot to chair used,  Ambulation/Gait     Assistive device: Rolling walker (2 wheeled) Gait Pattern/deviations: Step-to pattern;Narrow base of support Gait velocity: decreased   General Gait Details: unable to day   Stairs             Wheelchair Mobility    Modified Rankin (Stroke Patients Only)       Balance Overall balance assessment: Needs assistance Sitting-balance support: No upper extremity supported;Feet supported Sitting balance-Leahy Scale: Fair Sitting balance - Comments: Able to hold sitting without support and supervision.  unsafe to be left sitting for extended periods of time   Standing balance support: Bilateral upper extremity supported Standing balance-Leahy Scale: Poor Standing balance comment: Post LOB, unable to remain upright without mod assist.  Cognition Arousal/Alertness: Awake/alert Behavior During Therapy: WFL for tasks assessed/performed Overall Cognitive Status: Within Functional Limits for tasks assessed                                        Exercises Other Exercises Other Exercises: seated AROM BLE for LAQ and ankle pumps.  Limited by pain L hip    General Comments        Pertinent Vitals/Pain Pain Assessment: Faces Faces Pain Scale: Hurts even more Pain Location: L  hip - moaning in bed requesting pain medication Pain Descriptors / Indicators: Aching Pain Intervention(s): Patient requesting pain meds-RN notified;Monitored during session;Limited activity within patient's tolerance    Home Living                      Prior Function            PT Goals (current goals can now be found in the care plan section) Progress towards PT goals: Not progressing toward goals - comment    Frequency    7X/week      PT Plan Current plan remains appropriate    Co-evaluation              AM-PAC PT "6 Clicks" Mobility   Outcome Measure  Help needed turning from your back to your side while in a flat bed without using bedrails?: A Little Help needed moving from lying on your back to sitting on the side of a flat bed without using bedrails?: A Little Help needed moving to and from a bed to a chair (including a wheelchair)?: A Little Help needed standing up from a chair using your arms (e.g., wheelchair or bedside chair)?: A Lot Help needed to walk in hospital room?: Total Help needed climbing 3-5 steps with a railing? : Total 6 Click Score: 13    End of Session Equipment Utilized During Treatment: Gait belt Activity Tolerance: Patient limited by pain Patient left: in chair;with call bell/phone within reach;with chair alarm set Nurse Communication: Mobility status Pain - Right/Left: Left Pain - part of body: Hip     Time: 8413-2440 PT Time Calculation (min) (ACUTE ONLY): 23 min  Charges:  $Therapeutic Exercise: 8-22 mins $Therapeutic Activity: 8-22 mins                     Chesley Noon, PTA 12/21/18, 10:26 AM

## 2018-12-21 NOTE — Plan of Care (Signed)
  Problem: Education: Goal: Knowledge of General Education information will improve Description: Including pain rating scale, medication(s)/side effects and non-pharmacologic comfort measures Outcome: Not Progressing   Problem: Nutrition: Goal: Adequate nutrition will be maintained Outcome: Not Progressing   Problem: Safety: Goal: Ability to remain free from injury will improve Outcome: Not Progressing   Problem: Education: Goal: Knowledge of disease or condition will improve Outcome: Not Progressing Goal: Knowledge of secondary prevention will improve Outcome: Not Progressing Goal: Knowledge of patient specific risk factors addressed and post discharge goals established will improve Outcome: Not Progressing

## 2018-12-21 NOTE — Progress Notes (Addendum)
[facility] [place] NAME: Jill Hancock    MR#:  [id number]  DATE OF BIRTH:  [birth year]  SUBJECTIVE:  CHIEF COMPLAINT:   Chief Complaint  Patient presents with  . Hip Pain    -Patient ready for discharge home today medically.  Family not available to take her home until Monday morning.  REVIEW OF SYSTEMS:  Review of Systems  Constitutional: Positive for malaise/fatigue. Negative for chills and fever.  HENT: Negative for ear discharge, hearing loss and nosebleeds.   Eyes: Negative for blurred vision and double vision.  Respiratory: Negative for cough, shortness of breath and wheezing.   Cardiovascular: Positive for leg swelling. Negative for chest pain and palpitations.  Gastrointestinal: Negative for abdominal pain, constipation, diarrhea, nausea and vomiting.  Genitourinary: Negative for dysuria.  Musculoskeletal: Positive for joint pain and myalgias.  Neurological: Negative for dizziness, focal weakness, seizures, weakness and headaches.  Psychiatric/Behavioral: Negative for depression.    DRUG ALLERGIES:   Allergies  Allergen Reactions  . Demerol [Meperidine] Nausea And Vomiting  . Macrobid [Nitrofurantoin Macrocrystal] Rash  . Penicillins Rash    Has patient had a PCN reaction causing immediate rash, facial/tongue/throat swelling, SOB or lightheadedness with hypotension: Unknown Has patient had a PCN reaction causing severe rash involving mucus membranes or skin necrosis: Unknown Has patient had a PCN reaction that required hospitalization: Unknown Has patient had a PCN reaction occurring within the last 10 years: No If all of the above answers are "NO", then may proceed with Cephalosporin use.     VITALS:  Blood pressure (!) 145/77, pulse 80, temperature 98 F (36.7 C), temperature source Oral, resp. rate 20, height 5\' 2"  (1.575 m), weight 69.9 kg, SpO2 92 %.  PHYSICAL EXAMINATION:  Physical Exam   GENERAL:  78  y.o.-year-old patient sitting in the bed with no acute distress.  EYES: Pupils equal, round, reactive to light and accommodation. No scleral icterus. Extraocular muscles intact.  HEENT: Head atraumatic, normocephalic. Oropharynx and nasopharynx clear.  NECK:  Supple, no jugular venous distention. No thyroid enlargement, no tenderness.  LUNGS: Normal breath sounds bilaterally, no wheezing, rales,rhonchi or crepitation. No use of accessory muscles of respiration. Decreased bibasilar breath sounds CARDIOVASCULAR: S1, S2 normal. No  rubs, or gallops. 2/6 systolic murmur present ABDOMEN: Soft, nontender, nondistended. Bowel sounds present. No organomegaly or mass.  EXTREMITIES: No pedal edema, cyanosis, or clubbing.  NEUROLOGIC: Cranial nerves II through XII are intact. Muscle strength equal in both upper extremities.  Much weaker in both lower extremities especially left leg due to pain.  Sensation intact. Gait not checked.  PSYCHIATRIC: The patient is alert and oriented x 1-2.  Flat expression SKIN: No obvious rash, lesion, or ulcer.    LABORATORY PANEL:   CBC Recent Labs  Lab 12/17/18 1352  WBC 8.6  HGB 11.2*  HCT 35.1*  PLT 456*   ------------------------------------------------------------------------------------------------------------------  Chemistries  Recent Labs  Lab 12/19/18 0645  NA 138  K 3.6  CL 101  CO2 25  GLUCOSE 102*  BUN 8  CREATININE 0.71  CALCIUM 8.9   ------------------------------------------------------------------------------------------------------------------  Cardiac Enzymes Recent Labs  Lab 12/17/18 1718  TROPONINI 0.06*   ------------------------------------------------------------------------------------------------------------------  RADIOLOGY:  No results found.  EKG:   Orders placed or performed during the hospital encounter of 12/17/18  . ED EKG  . ED EKG  . EKG 12-Lead  . EKG 12-Lead    ASSESSMENT AND PLAN:   78 year old  female with depression and  anxiety, cognitive deficit, prior stroke, vertigo recent fall and left hip fracture was brought in from home secondary to weakness and confusion.  1.  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  Patient admitted with right leg weakness.   -No right leg weakness currently.  Symptoms have much resolved.  Exam is limited as patient cannot completely bear weight while standing due to left periprosthetic fracture. -Prior infarction noted, and significant microvascular ischemic disease changes.  Concern for underlying vascular dementia as well. -Continue aspirin.  Plavix has been added for recurrent acute strokes -Appreciate neuro consult.  Carotid Dopplers with minor atherosclerosis, no hemodynamically significant stenosis noted.  And echo are with no interatrial shunt.  EF is 60 to 65% -PT and OT consults recommended rehab-however patient has used her rehab days and does not have any bed offers at this time.  Will be discharged home with home health services.  2.  Left periprosthetic hip fracture-patient was here 5 weeks ago for left hip intertrochanteric fracture and had intramedullary nail placement by Ortho.  Was discharged to rehab facility.  Family denies any further falls. -Patient now complaining of left leg pain -Repeat x-rays now showing a new incomplete subtrochanteric hip fracture of the left hip. -Ortho reconsulted.  Nonsurgical management at this time.  Continue pain control and physical therapy. -Partial weightbearing as tolerated on the left side  3.  Hypertension-on Norvasc, lisinopril  4.  DVT prophylaxis-Lovenox  Due to inability to go to rehab, use defer rehab days and no bed offers-family will be taking her home with home health services.  All equipment  ordered -Palliative care at discharge.  Will be high risk for readmission.  Discharge home on Monday   All the records are reviewed and case discussed with Care  Management/Social Workerr. Management plans discussed with the patient, family and they are in agreement.  CODE STATUS: DNR  TOTAL TIME TAKING CARE OF THIS PATIENT: 38 minutes.   POSSIBLE D/C IN 2 DAYS, DEPENDING ON CLINICAL CONDITION.   Enid Baasadhika Brewster Wolters M.D on 12/21/2018 at 11:21 AM  Between 7am to 6pm - Pager - [phone]  After 6pm go to [email or web address] - Social research officer, governmentpassword EPAS [facility]  [facility]  Office  [phone]  CC: Primary care physician; Smitty CordsKaramalegos, Alexander J, DO

## 2018-12-22 LAB — CBC
HCT: 35.8 % — ABNORMAL LOW (ref 36.0–46.0)
Hemoglobin: 11.7 g/dL — ABNORMAL LOW (ref 12.0–15.0)
MCH: 28.3 pg (ref 26.0–34.0)
MCHC: 32.7 g/dL (ref 30.0–36.0)
MCV: 86.5 fL (ref 80.0–100.0)
Platelets: 441 10*3/uL — ABNORMAL HIGH (ref 150–400)
RBC: 4.14 MIL/uL (ref 3.87–5.11)
RDW: 14.5 % (ref 11.5–15.5)
WBC: 11.1 10*3/uL — ABNORMAL HIGH (ref 4.0–10.5)
nRBC: 0 % (ref 0.0–0.2)

## 2018-12-22 LAB — BASIC METABOLIC PANEL
Anion gap: 11 (ref 5–15)
BUN: 17 mg/dL (ref 8–23)
CO2: 25 mmol/L (ref 22–32)
Calcium: 9 mg/dL (ref 8.9–10.3)
Chloride: 96 mmol/L — ABNORMAL LOW (ref 98–111)
Creatinine, Ser: 0.83 mg/dL (ref 0.44–1.00)
GFR calc Af Amer: >60 mL/min (ref >60)
GFR calc non Af Amer: >60 mL/min (ref >60)
Glucose, Bld: 102 mg/dL — ABNORMAL HIGH (ref 70–99)
Potassium: 3.4 mmol/L — ABNORMAL LOW (ref 3.5–5.1)
Sodium: 132 mmol/L — ABNORMAL LOW (ref 135–145)

## 2018-12-22 MED ORDER — RISPERIDONE 0.5 MG PO TABS
0.5000 mg | ORAL_TABLET | Freq: Two times a day (BID) | ORAL | Status: DC | PRN
  Administered 2018-12-22: 0.5 mg via ORAL
  Filled 2018-12-22 (×2): qty 1

## 2018-12-22 MED ORDER — METHOCARBAMOL 500 MG PO TABS: 500.0000 mg | ORAL_TABLET | Freq: Three times a day (TID) | ORAL | 0 refills | Status: AC

## 2018-12-22 MED ORDER — METHOCARBAMOL 500 MG PO TABS
500.0000 mg | ORAL_TABLET | Freq: Three times a day (TID) | ORAL | Status: DC
  Administered 2018-12-22 – 2018-12-23 (×4): 500 mg via ORAL
  Filled 2018-12-22 (×6): qty 1

## 2018-12-22 NOTE — Plan of Care (Signed)
  Problem: Education: Goal: Knowledge of General Education information will improve Description: Including pain rating scale, medication(s)/side effects and non-pharmacologic comfort measures Outcome: Not Progressing   Problem: Health Behavior/Discharge Planning: Goal: Ability to manage health-related needs will improve Outcome: Not Progressing   Problem: Activity: Goal: Risk for activity intolerance will decrease Outcome: Not Progressing   Problem: Pain Managment: Goal: General experience of comfort will improve Outcome: Not Progressing   Problem: Education: Goal: Knowledge of disease or condition will improve Outcome: Not Progressing Goal: Knowledge of secondary prevention will improve Outcome: Not Progressing Goal: Knowledge of patient specific risk factors addressed and post discharge goals established will improve Outcome: Not Progressing

## 2018-12-22 NOTE — Progress Notes (Signed)
Physical Therapy Treatment Patient Details Name: Jill Hancock MRN: [id number] DOB: September 15, 1940 Today's Date: 12/22/2018    History of Present Illness Pt. is a 78 y.o. female who was [facility] with worsening L LE pain, progressive weakness. 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 Pt. also has a new periprosthetic incomplete Hip Fx s/p ORIF IM nailing of the left intertrochanteric Hip Fx. Pt. had been in SNF at [facility] for therapy following her initial hip fracture, and was admitted to the hospital 1 day after discharging home from the SNF.    PT Comments    Pt awake, ready to get up for breakfast.  Participated in exercises as described below. To edge of bed with mi a x 1.  Once sitting, stable with supervision.  Stood x with walker for up to 20 seconds.  Initially min a x 1 but increased to mod a x 1 with fatigue.  She was unable to transfer with walker so stand pivot was used to get pt pt chair.  Positioned for comfort.     Follow Up Recommendations  SNF;Home health PT     Equipment Recommendations  Rolling walker with 5" wheels;3in1 (PT)    Recommendations for Other Services       Precautions / Restrictions Precautions Precautions: Fall Restrictions Weight Bearing Restrictions: Yes LLE Weight Bearing: Partial weight bearing    Mobility  Bed Mobility Overal bed mobility: Needs Assistance Bed Mobility: Supine to Sit     Supine to sit: Min assist        Transfers Overall transfer level: Independent Equipment used: Rolling walker (2 wheeled);None Transfers: Sit to/from [employer]Stand;Stand Pivot Transfers Sit to Stand: Mod assist Stand pivot transfers: Mod assist       General transfer comment: difficulty moving feet this session, walker removed and stand pivot to chair used,  Ambulation/Gait             General Gait Details: unable today   Stairs             Wheelchair Mobility    Modified Rankin (Stroke Patients Only)       Balance Overall balance assessment: Needs assistance Sitting-balance support: No upper extremity supported;Feet supported Sitting balance-Leahy Scale: Fair Sitting balance - Comments: Able to hold sitting without support and supervision.  unsafe to be left sitting for extended periods of time   Standing balance support: Bilateral upper extremity supported Standing balance-Leahy Scale: Poor Standing balance comment: Post LOB, unable to remain upright without mod assist.                            Cognition Arousal/Alertness: Awake/alert   Overall Cognitive Status: Within Functional Limits for tasks assessed                                        Exercises Other Exercises Other Exercises: supine AAROM for ankle pumps, heel slides, ab/add and SLR x 10 BLE    General Comments        Pertinent Vitals/Pain Pain Assessment: Faces Faces Pain Scale: Hurts little more Pain Location: L hip - moaning at times with gentle ex Pain Descriptors / Indicators: Aching Pain Intervention(s): Limited activity within patient's tolerance;Monitored during session    Home Living  Prior Function            PT Goals (current goals can now be found in the care plan section) Progress towards PT goals: Progressing toward goals    Frequency    7X/week      PT Plan Current plan remains appropriate    Co-evaluation              AM-PAC PT "6 Clicks" Mobility   Outcome Measure  Help needed turning from your back to your side while in a flat bed without using bedrails?: A Little Help needed moving from lying on your back to sitting on the side of a flat bed without using bedrails?: A Little Help needed moving to and from a bed to a chair (including a wheelchair)?: A Little Help needed standing up from a chair using your arms (e.g., wheelchair  or bedside chair)?: A Lot Help needed to walk in hospital room?: Total Help needed climbing 3-5 steps with a railing? : Total 6 Click Score: 13    End of Session Equipment Utilized During Treatment: Gait belt Activity Tolerance: Patient limited by pain Patient left: in chair;with call bell/phone within reach;with chair alarm set   Pain - Right/Left: Left Pain - part of body: Hip     Time: 2694-8546 PT Time Calculation (min) (ACUTE ONLY): 13 min  Charges:  $Therapeutic Activity: 8-22 mins                     Chesley Noon, PTA 12/22/18, 9:09 AM

## 2018-12-22 NOTE — Plan of Care (Signed)
PT VSS through the night. Patient continues to complain of pain. See MAR for medications given. NIH remains stable with a score of 2. Will continue to monitor.    Problem: Education: Goal: Knowledge of General Education information will improve Description: Including pain rating scale, medication(s)/side effects and non-pharmacologic comfort measures Outcome: Progressing   Problem: Health Behavior/Discharge Planning: Goal: Ability to manage health-related needs will improve Outcome: Progressing   Problem: Clinical Measurements: Goal: Ability to maintain clinical measurements within normal limits will improve Outcome: Progressing Goal: Will remain free from infection Outcome: Progressing Goal: Diagnostic test results will improve Outcome: Progressing Goal: Respiratory complications will improve Outcome: Progressing Goal: Cardiovascular complication will be avoided Outcome: Progressing   Problem: Activity: Goal: Risk for activity intolerance will decrease Outcome: Progressing   Problem: Nutrition: Goal: Adequate nutrition will be maintained Outcome: Progressing   Problem: Coping: Goal: Level of anxiety will decrease Outcome: Progressing   Problem: Elimination: Goal: Will not experience complications related to bowel motility Outcome: Progressing Goal: Will not experience complications related to urinary retention Outcome: Progressing   Problem: Pain Managment: Goal: General experience of comfort will improve Outcome: Progressing   Problem: Safety: Goal: Ability to remain free from injury will improve Outcome: Progressing   Problem: Skin Integrity: Goal: Risk for impaired skin integrity will decrease Outcome: Progressing   Problem: Education: Goal: Knowledge of disease or condition will improve Outcome: Progressing Goal: Knowledge of secondary prevention will improve Outcome: Progressing Goal: Knowledge of patient specific risk factors addressed and post  discharge goals established will improve Outcome: Progressing

## 2018-12-22 NOTE — Progress Notes (Signed)
[facility] - [place] at [facility]   [facility]    MR#:  [id number]  DATE OF BIRTH:  June 09, 1941  SUBJECTIVE:  CHIEF COMPLAINT:   Chief Complaint  Patient presents with  . Hip Pain    -Complains of left hip pain on moving.  Some agitation yesterday morning.  REVIEW OF SYSTEMS:  Review of Systems  Constitutional: Positive for malaise/fatigue. Negative for chills and fever.  HENT: Negative for ear discharge, hearing loss and nosebleeds.   Eyes: Negative for blurred vision and double vision.  Respiratory: Negative for cough, shortness of breath and wheezing.   Cardiovascular: Positive for leg swelling. Negative for chest pain and palpitations.  Gastrointestinal: Negative for abdominal pain, constipation, diarrhea, nausea and vomiting.  Genitourinary: Negative for dysuria.  Musculoskeletal: Positive for joint pain and myalgias.  Neurological: Negative for dizziness, focal weakness, seizures, weakness and headaches.  Psychiatric/Behavioral: Negative for depression.    DRUG ALLERGIES:   Allergies  Allergen Reactions  . Demerol [Meperidine] Nausea And Vomiting  . Macrobid [Nitrofurantoin Macrocrystal] Rash  . Penicillins Rash    Has patient had a PCN reaction causing immediate rash, facial/tongue/throat swelling, SOB or lightheadedness with hypotension: Unknown Has patient had a PCN reaction causing severe rash involving mucus membranes or skin necrosis: Unknown Has patient had a PCN reaction that required hospitalization: Unknown Has patient had a PCN reaction occurring within the last 10 years: No If all of the above answers are "NO", then may proceed with Cephalosporin use.     VITALS:  Blood pressure 114/71, pulse 85, temperature 97.7 F (36.5 C), temperature source Oral, resp. rate 20, height 5\' 2"  (1.575 m), weight 69.9 kg, SpO2 96 %.  PHYSICAL EXAMINATION:  Physical Exam   GENERAL:  78 y.o.-year-old patient sitting in the bed  with no acute distress.  EYES: Pupils equal, round, reactive to light and accommodation. No scleral icterus. Extraocular muscles intact.  HEENT: Head atraumatic, normocephalic. Oropharynx and nasopharynx clear.  NECK:  Supple, no jugular venous distention. No thyroid enlargement, no tenderness.  LUNGS: Normal breath sounds bilaterally, no wheezing, rales,rhonchi or crepitation. No use of accessory muscles of respiration. Decreased bibasilar breath sounds CARDIOVASCULAR: S1, S2 normal. No  rubs, or gallops. 2/6 systolic murmur present ABDOMEN: Soft, nontender, nondistended. Bowel sounds present. No organomegaly or mass.  EXTREMITIES: No pedal edema, cyanosis, or clubbing.  NEUROLOGIC: Cranial nerves II through XII are intact. Muscle strength equal in both upper extremities.  Much weaker in both lower extremities especially left leg due to pain.  Sensation intact. Gait not checked.  PSYCHIATRIC: The patient is alert and oriented x 1-2.  Flat expression SKIN: No obvious rash, lesion, or ulcer.    LABORATORY PANEL:   CBC Recent Labs  Lab 12/22/18 0545  WBC 11.1*  HGB 11.7*  HCT 35.8*  PLT 441*   ------------------------------------------------------------------------------------------------------------------  Chemistries  Recent Labs  Lab 12/22/18 0545  NA 132*  K 3.4*  CL 96*  CO2 25  GLUCOSE 102*  BUN 17  CREATININE 0.83  CALCIUM 9.0   ------------------------------------------------------------------------------------------------------------------  Cardiac Enzymes Recent Labs  Lab 12/17/18 1718  TROPONINI 0.06*   ------------------------------------------------------------------------------------------------------------------  RADIOLOGY:  No results found.  EKG:   Orders placed or performed during the hospital encounter of 12/17/18  . ED EKG  . ED EKG  . EKG 12-Lead  . EKG 12-Lead    ASSESSMENT AND PLAN:   78 year old female with depression and anxiety,  cognitive deficit, prior stroke, vertigo recent  fall and left hip fracture was brought in from home secondary to weakness and confusion.  1.  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  Patient admitted with right leg weakness.   -No right leg weakness currently.  Symptoms have much resolved.  Exam is limited as patient cannot completely bear weight while standing due to left periprosthetic fracture. -Prior infarction noted, and significant microvascular ischemic disease changes.  Concern for underlying vascular dementia as well. -Continue aspirin.  Plavix has been added for recurrent acute strokes -Appreciate neuro consult.  Carotid Dopplers with minor atherosclerosis, no hemodynamically significant stenosis noted.  And echo are with no interatrial shunt.  EF is 60 to 65% -PT and OT consults recommended rehab-however patient has used her rehab days and does not have any bed offers at this time.  Will be discharged home with home health services.  2.  Left periprosthetic hip fracture-patient was here 5 weeks ago for left hip intertrochanteric fracture and had intramedullary nail placement by Ortho.  Was discharged to rehab facility.  Family denies any further falls. -Patient now complaining of left leg pain.  Continue tramadol for pain.  Add muscle relaxant Robaxin -Repeat x-rays now showing a new incomplete subtrochanteric hip fracture of the left hip. -Ortho reconsulted.  Nonsurgical management at this time.  Continue pain control and physical therapy. -Partial weightbearing as tolerated on the left side  3.  Hypertension-on Norvasc, lisinopril  4.  DVT prophylaxis-Lovenox  Due to inability to go to rehab, use defer rehab days and no bed offers-family will be taking her home with home health services.  All equipment  ordered -Palliative care at discharge.  Will be high risk for readmission.  Discharge home on Monday   All the records are reviewed and case  discussed with Care Management/Social Workerr. Management plans discussed with the patient, family and they are in agreement.  CODE STATUS: DNR  TOTAL TIME TAKING CARE OF THIS PATIENT: 36 minutes.   POSSIBLE D/C IN 1-2 DAYS, DEPENDING ON CLINICAL CONDITION.   Gladstone Lighter M.D on 12/22/2018 at 9:00 AM  Between 7am to 6pm - Pager - [phone]  After 6pm go to [email or web address] - password [email or web address] [facility]  Office  [phone]  CC: Primary care physician; Olin Hauser, DO

## 2018-12-23 ENCOUNTER — Other Ambulatory Visit: Payer: Self-pay | Admitting: Family Medicine

## 2018-12-23 DIAGNOSIS — K219 Gastro-esophageal reflux disease without esophagitis: Secondary | ICD-10-CM

## 2018-12-23 NOTE — Progress Notes (Signed)
Pt for discharge home via ems. They have been called. Spoke with robin pts dtr on phone and discussed  Meds/ activity and f/u. Packet  To be sent  Home with pt.

## 2018-12-23 NOTE — Discharge Summary (Signed)
[facility] - [place] at [facility]   PATIENT NAME: Jill Hancock    MR#:  [id number]  DATE OF BIRTH:  Aug 25, 1940  DATE OF ADMISSION:  12/17/2018   ADMITTING PHYSICIAN: Enedina FinnerSona Patel, MD  DATE OF DISCHARGE: 12/23/18  PRIMARY CARE PHYSICIAN: Smitty CordsKaramalegos, Alexander J, DO   ADMISSION DIAGNOSIS:   Elevated troponin [R79.89] Left hip pain [M25.552] Cerebrovascular accident (CVA), unspecified mechanism (HCC) [I63.9]  DISCHARGE DIAGNOSIS:   Active Problems:   CVA (cerebral vascular accident) (HCC)   SECONDARY DIAGNOSIS:   Past Medical History:  Diagnosis Date   Anxiety    Depression    Hyperglycemia    Hypertension    Hypokalemia    Osteoporosis    Stroke (HCC) 02/2012   MRI revealed at least 3 subcentimeter acute infarctions with one area of subacute infarction in widely disparate vascular territories including territory of left cerebellum and around right caudate nucleus along with widespread lacunar infarcts, chronic microvascular ischemic change, and numerous microbleeds suggesting long standing hypertensive cerebrovascular disease.  MRA confirmed intracranial   Unsteady gait    Vertigo     HOSPITAL COURSE:   78 year old female with depression and anxiety, cognitive deficit, prior stroke, vertigo recent fall and left hip fracture was brought in from home secondary to weakness and confusion.  1.  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  Patient admitted with right leg weakness.   -No right leg weakness currently.  Symptoms have much resolved.  Exam is limited as patient cannot completely bear weight while standing due to left periprosthetic fracture. -Prior infarction noted, and significant microvascular ischemic disease changes.  Concern for underlying vascular dementia as well. -Continue aspirin.  Plavix has been added for recurrent acute strokes -Appreciate neuro consult.  Carotid Dopplers with minor  atherosclerosis, no hemodynamically significant stenosis noted.  And echo are with no interatrial shunt.  EF is 60 to 65% -PT and OT consults recommended rehab-however patient has used her rehab days and does not have any bed offers at this time.  Will be discharged home with home health services.  2.  Left periprosthetic hip fracture-patient was here 5 weeks ago for left hip intertrochanteric fracture and had intramedullary nail placement by Ortho.  Was discharged to rehab facility.  Family denies any further falls. -Patient now complaining of left leg pain.  Continue tramadol for pain.  Add muscle relaxant Robaxin -Repeat x-rays now showing a new incomplete subtrochanteric hip fracture of the left hip. -Ortho reconsulted.  Nonsurgical management at this time.  Continue pain control and physical therapy. -Partial weightbearing as tolerated on the left side  3.  Hypertension-on Norvasc, lisinopril  Due to inability to go to rehab, use defer rehab days and no bed offers-family will be taking her home with home health services.  All equipment  ordered -Palliative care at discharge.  Will be high risk for readmission.  Discharge home on Monday   DISCHARGE CONDITIONS:   Guarded  CONSULTS OBTAINED:   Treatment Team:  Marita SnellenGuerch, Meziane, MD Juanell FairlyKrasinski, Kevin, MD  DRUG ALLERGIES:   Allergies  Allergen Reactions   Demerol [Meperidine] Nausea And Vomiting   Macrobid [Nitrofurantoin Macrocrystal] Rash   Penicillins Rash    Has patient had a PCN reaction causing immediate rash, facial/tongue/throat swelling, SOB or lightheadedness with hypotension: Unknown Has patient had a PCN reaction causing severe rash involving mucus membranes or skin necrosis: Unknown Has patient had a PCN reaction that required hospitalization: Unknown Has patient had a PCN  reaction occurring within the last 10 years: No If all of the above answers are "NO", then may proceed with Cephalosporin use.     DISCHARGE MEDICATIONS:   Allergies as of 12/23/2018      Reactions   Demerol [meperidine] Nausea And Vomiting   Macrobid [nitrofurantoin Macrocrystal] Rash   Penicillins Rash   Has patient had a PCN reaction causing immediate rash, facial/tongue/throat swelling, SOB or lightheadedness with hypotension: Unknown Has patient had a PCN reaction causing severe rash involving mucus membranes or skin necrosis: Unknown Has patient had a PCN reaction that required hospitalization: Unknown Has patient had a PCN reaction occurring within the last 10 years: No If all of the above answers are "NO", then may proceed with Cephalosporin use.      Medication List    STOP taking these medications   pravastatin 20 MG tablet Commonly known as: PRAVACHOL     TAKE these medications   acetaminophen 500 MG tablet Commonly known as: TYLENOL Take 500 mg by mouth 3 (three) times daily.   alendronate 70 MG tablet Commonly known as: FOSAMAX TAKE 1 TABLET EVERY 7 DAYS.  SEE PACKAGE FOR ADDITIONAL INSTRUCTIONS What changed:   how much to take  how to take this  when to take this  additional instructions   amLODipine 10 MG tablet Commonly known as: NORVASC TAKE 1 TABLET BY MOUTH EVERY DAY   aspirin 81 MG tablet Take 1 tablet (81 mg total) by mouth daily.   atorvastatin 40 MG tablet Commonly known as: LIPITOR Take 1 tablet (40 mg total) by mouth daily at 6 PM.   AZO CRANBERRY URINARY TRACT PO Take 2 tablets by mouth daily.   clopidogrel 75 MG tablet Commonly known as: PLAVIX Take 1 tablet (75 mg total) by mouth daily.   docusate sodium 100 MG capsule Commonly known as: COLACE Take 1 capsule (100 mg total) by mouth 2 (two) times daily.   esomeprazole 20 MG capsule Commonly known as: NEXIUM TAKE 1 CAPSULE (20 MG TOTAL) BY MOUTH DAILY BEFORE BREAKFAST.   feeding supplement (ENSURE ENLIVE) Liqd Take 237 mLs by mouth 2 (two) times daily between meals.   FLUoxetine 40 MG  capsule Commonly known as: PROZAC Take 1 capsule (40 mg total) by mouth daily.   ipratropium 0.06 % nasal spray Commonly known as: ATROVENT Place 2 sprays into both nostrils 4 (four) times daily. For up to 5-7 days then stop.   lisinopril 40 MG tablet Commonly known as: ZESTRIL Take 1 tablet (40 mg total) by mouth daily.   LORazepam 0.5 MG tablet Commonly known as: ATIVAN Take 1 tablet (0.5 mg total) by mouth at bedtime as needed for anxiety or sleep.   methocarbamol 500 MG tablet Commonly known as: ROBAXIN Take 1 tablet (500 mg total) by mouth 3 (three) times daily for 5 days.   multivitamin with minerals tablet Take 1 tablet by mouth daily.   polyethylene glycol 17 g packet Commonly known as: MIRALAX / GLYCOLAX Take 17 g by mouth daily as needed for mild constipation.   potassium chloride 10 MEQ tablet Commonly known as: K-DUR Take 1 tablet (10 mEq total) by mouth daily.   traMADol 50 MG tablet Commonly known as: ULTRAM Take 1 tablet (50 mg total) by mouth every 6 (six) hours as needed for moderate pain or severe pain.   Vitamin D3 125 MCG (5000 UT) Caps Take 1 capsule (5,000 Units total) by mouth daily. For 12 weeks, then start Vitamin D3 2,000  units daily (OTC)            Durable Medical Equipment  (From admission, onward)         Start     Ordered   12/20/18 1012  For home use only DME Hospital bed  Once    Question Answer Comment  Length of Need Lifetime   The above medical condition requires: Patient requires the ability to reposition frequently   Head must be elevated greater than: 30 degrees   Bed type Semi-electric   Hoyer Lift Yes   Support Surface: Gel Overlay      12/20/18 1012           DISCHARGE INSTRUCTIONS:   1. PCP f/u in 1-2 weeks 2. Palliative care at discharge  DIET:   Cardiac diet  ACTIVITY:   Activity as tolerated  OXYGEN:   Home Oxygen: No.  Oxygen Delivery: room air  DISCHARGE LOCATION:   home   If you  experience worsening of your admission symptoms, develop shortness of breath, life threatening emergency, suicidal or homicidal thoughts you must seek medical attention immediately by calling 911 or calling your MD immediately  if symptoms less severe.  You Must read complete instructions/literature along with all the possible adverse reactions/side effects for all the Medicines you take and that have been prescribed to you. Take any new Medicines after you have completely understood and accpet all the possible adverse reactions/side effects.   Please note  You were cared for by a hospitalist during your hospital stay. If you have any questions about your discharge medications or the care you received while you were in the hospital after you are discharged, you can call the unit and asked to speak with the hospitalist on call if the hospitalist that took care of you is not available. Once you are discharged, your primary care physician will handle any further medical issues. Please note that NO REFILLS for any discharge medications will be authorized once you are discharged, as it is imperative that you return to your primary care physician (or establish a relationship with a primary care physician if you do not have one) for your aftercare needs so that they can reassess your need for medications and monitor your lab values.    On the day of Discharge:  VITAL SIGNS:   Blood pressure 124/70, pulse 76, temperature 97.9 F (36.6 C), temperature source Oral, resp. rate 18, height 5\' 2"  (1.575 m), weight 69.9 kg, SpO2 98 %.  PHYSICAL EXAMINATION:   GENERAL:  78 y.o.-year-old patient sitting in the bed with no acute distress.  EYES: Pupils equal, round, reactive to light and accommodation. No scleral icterus. Extraocular muscles intact.  HEENT: Head atraumatic, normocephalic. Oropharynx and nasopharynx clear.  NECK:  Supple, no jugular venous distention. No thyroid enlargement, no tenderness.   LUNGS: Normal breath sounds bilaterally, no wheezing, rales,rhonchi or crepitation. No use of accessory muscles of respiration. Decreased bibasilar breath sounds CARDIOVASCULAR: S1, S2 normal. No  rubs, or gallops. 2/6 systolic murmur present ABDOMEN: Soft, nontender, nondistended. Bowel sounds present. No organomegaly or mass.  EXTREMITIES: No pedal edema, cyanosis, or clubbing.  NEUROLOGIC: Cranial nerves II through XII are intact. Muscle strength equal in both upper extremities.  Much weaker in both lower extremities especially left leg due to pain.  Sensation intact. Gait not checked.  PSYCHIATRIC: The patient is alert and oriented x 1-2.  Flat expression SKIN: No obvious rash, lesion, or ulcer.   DATA REVIEW:  CBC Recent Labs  Lab 12/22/18 0545  WBC 11.1*  HGB 11.7*  HCT 35.8*  PLT 441*    Chemistries  Recent Labs  Lab 12/22/18 0545  NA 132*  K 3.4*  CL 96*  CO2 25  GLUCOSE 102*  BUN 17  CREATININE 0.83  CALCIUM 9.0     Microbiology Results  Results for orders placed or performed during the hospital encounter of 12/17/18  SARS Coronavirus 2 (CEPHEID- Performed in [facility] hospital lab), Hosp Order     Status: None   Collection Time: 12/17/18  7:38 PM   Specimen: Nasopharyngeal Swab  Result Value Ref Range Status   SARS Coronavirus 2 NEGATIVE NEGATIVE Final    Comment: (NOTE) If result is NEGATIVE SARS-CoV-2 target nucleic acids are NOT DETECTED. The SARS-CoV-2 RNA is generally detectable in upper and lower  respiratory specimens during the acute phase of infection. The lowest  concentration of SARS-CoV-2 viral copies this assay can detect is 250  copies / mL. A negative result does not preclude SARS-CoV-2 infection  and should not be used as the sole basis for treatment or other  patient management decisions.  A negative result may occur with  improper specimen collection / handling, submission of specimen other  than nasopharyngeal swab, presence of  viral mutation(s) within the  areas targeted by this assay, and inadequate number of viral copies  (<250 copies / mL). A negative result must be combined with clinical  observations, patient history, and epidemiological information. If result is POSITIVE SARS-CoV-2 target nucleic acids are DETECTED. The SARS-CoV-2 RNA is generally detectable in upper and lower  respiratory specimens dur ing the acute phase of infection.  Positive  results are indicative of active infection with SARS-CoV-2.  Clinical  correlation with patient history and other diagnostic information is  necessary to determine patient infection status.  Positive results do  not rule out bacterial infection or co-infection with other viruses. If result is PRESUMPTIVE POSTIVE SARS-CoV-2 nucleic acids MAY BE PRESENT.   A presumptive positive result was obtained on the submitted specimen  and confirmed on repeat testing.  While 2019 novel coronavirus  (SARS-CoV-2) nucleic acids may be present in the submitted sample  additional confirmatory testing may be necessary for epidemiological  and / or clinical management purposes  to differentiate between  SARS-CoV-2 and other Sarbecovirus currently known to infect humans.  If clinically indicated additional testing with an alternate test  methodology [phone]) is advised. The SARS-CoV-2 RNA is generally  detectable in upper and lower respiratory sp ecimens during the acute  phase of infection. The expected result is Negative. Fact Sheet for Patients:  BoilerBrush.com.cy Fact Sheet for Healthcare Providers: https://pope.com/ This test is not yet approved or cleared by the [place] FDA and has been authorized for detection and/or diagnosis of SARS-CoV-2 by FDA under an Emergency Use Authorization (EUA).  This EUA will remain in effect (meaning this test can be used) for the duration of the COVID-19 declaration under Section  564(b)(1) of the Act, 21 U.S.C. section 360bbb-3(b)(1), unless the authorization is terminated or revoked sooner. Performed at [facility], [address] [place], [place] [address]     RADIOLOGY:  No results found.   Management plans discussed with the patient, family and they are in agreement.  CODE STATUS:     Code Status Orders  (From admission, onward)         Start     Ordered   12/18/18 1406  Do  not attempt resuscitation (DNR)  Continuous    Question Answer Comment  In the event of cardiac or respiratory ARREST Do not call a code blue   In the event of cardiac or respiratory ARREST Do not perform Intubation, CPR, defibrillation or ACLS   In the event of cardiac or respiratory ARREST Use medication by any route, position, wound care, and other measures to relive pain and suffering. May use oxygen, suction and manual treatment of airway obstruction as needed for comfort.      12/18/18 1405        Code Status History    Date Active Date Inactive Code Status Order ID Comments User Context   12/17/2018 2118 12/18/2018 1405 Full Code [id number]  Enedina FinnerPatel, Sona, MD Inpatient   11/21/2018 0159 11/26/2018 2035 Full Code [id number]  Hannah BeatMansy, Jan A, MD ED   12/31/2017 1641 01/02/2018 2218 Full Code [id number]  Ihor AustinPyreddy, Pavan, MD Inpatient   Advance Care Planning Activity      TOTAL TIME TAKING CARE OF THIS PATIENT: 38 minutes.    Enid Baasadhika Journii Nierman M.D on 12/23/2018 at 8:30 AM  Between 7am to 6pm - Pager - [phone]  After 6pm go to [email or web address] - Social research officer, governmentpassword [email or web address]  [facility] [place] [facility]  Office  [phone]  CC: Primary care physician; Smitty CordsKaramalegos, Alexander J, DO   Note: This dictation was prepared with Dragon dictation along with smaller phrase technology. Any transcriptional errors that result from this process are unintentional.

## 2018-12-23 NOTE — Care Management Important Message (Signed)
Important Message  Patient Details  Name: Jill Hancock MRN: [id number] Date of Birth: 1940-08-14   Medicare Important Message Given:  Yes     [name] 12/23/2018, 11:10 AM

## 2018-12-23 NOTE — TOC Transition Note (Signed)
Transition of Care [facility]) - CM/SW Discharge Note   Patient Details  Name: ARIADNA SETTER MRN: [id number] Date of Birth: [birth year]  Transition of Care [facility] CM/SW Contact:  Shelbie Hutching, RN Phone Number: 12/23/2018, 1:59 PM   Clinical Narrative:     Patient discharging home today.  Patient will transport via [employer] cabin crew.  Daughter will let bedside RN know once bed has been delivered and then transport can be arranged.  Home health arranged with [facility].  Equipment supplied by [employer].   Final next level of care: [place] Barriers to Discharge: Barriers Resolved   Patient Goals and CMS Choice Patient states their goals for this hospitalization and ongoing recovery are:: Daughter's verbalize need for help, they report that they cannot care for their mother at home      Discharge Placement                       Discharge Plan and Services   Discharge Planning Services: CM Consult Post Acute Care Choice: Home Health                    HH Arranged: RN, PT, OT, Nurse's Aide, Social Work [employer] Agency: [place] ([place]) Date [place]: 12/23/18 Time [place]: [id number] Representative spoke with at [place]: [place] ([facility]) Interventions     Readmission Risk Interventions No flowsheet data found.

## 2018-12-23 NOTE — Care Management Important Message (Signed)
Important Message  Patient Details  Name: Jill Hancock MRN: [id number] Date of Birth: 24-Nov-1940   Medicare Important Message Given:  Yes     [name] A Linard Daft 12/23/2018, 9:43 AM

## 2018-12-24 DIAGNOSIS — I69398 Other sequelae of cerebral infarction: Secondary | ICD-10-CM | POA: Diagnosis not present

## 2018-12-24 DIAGNOSIS — M9702XD Periprosthetic fracture around internal prosthetic left hip joint, subsequent encounter: Secondary | ICD-10-CM | POA: Diagnosis not present

## 2018-12-24 DIAGNOSIS — M810 Age-related osteoporosis without current pathological fracture: Secondary | ICD-10-CM | POA: Diagnosis not present

## 2018-12-24 DIAGNOSIS — F0150 Vascular dementia without behavioral disturbance: Secondary | ICD-10-CM | POA: Diagnosis not present

## 2018-12-24 DIAGNOSIS — S72392D Other fracture of shaft of left femur, subsequent encounter for closed fracture with routine healing: Secondary | ICD-10-CM | POA: Diagnosis not present

## 2018-12-24 DIAGNOSIS — R531 Weakness: Secondary | ICD-10-CM | POA: Diagnosis not present

## 2018-12-24 DIAGNOSIS — I10 Essential (primary) hypertension: Secondary | ICD-10-CM | POA: Diagnosis not present

## 2018-12-24 DIAGNOSIS — S72142D Displaced intertrochanteric fracture of left femur, subsequent encounter for closed fracture with routine healing: Secondary | ICD-10-CM | POA: Diagnosis not present

## 2018-12-24 DIAGNOSIS — F419 Anxiety disorder, unspecified: Secondary | ICD-10-CM | POA: Diagnosis not present

## 2018-12-25 ENCOUNTER — Telehealth: Payer: Self-pay

## 2018-12-25 ENCOUNTER — Ambulatory Visit (INDEPENDENT_AMBULATORY_CARE_PROVIDER_SITE_OTHER): Payer: Medicare HMO | Admitting: Family Medicine

## 2018-12-25 ENCOUNTER — Ambulatory Visit: Payer: Self-pay | Admitting: *Deleted

## 2018-12-25 ENCOUNTER — Other Ambulatory Visit: Payer: Self-pay

## 2018-12-25 ENCOUNTER — Encounter: Payer: Self-pay | Admitting: Family Medicine

## 2018-12-25 DIAGNOSIS — F0151 Vascular dementia with behavioral disturbance: Secondary | ICD-10-CM

## 2018-12-25 DIAGNOSIS — F339 Major depressive disorder, recurrent, unspecified: Secondary | ICD-10-CM

## 2018-12-25 DIAGNOSIS — F01518 Vascular dementia, unspecified severity, with other behavioral disturbance: Secondary | ICD-10-CM

## 2018-12-25 DIAGNOSIS — F5101 Primary insomnia: Secondary | ICD-10-CM

## 2018-12-25 DIAGNOSIS — I10 Essential (primary) hypertension: Secondary | ICD-10-CM

## 2018-12-25 DIAGNOSIS — M978XXA Periprosthetic fracture around other internal prosthetic joint, initial encounter: Secondary | ICD-10-CM

## 2018-12-25 DIAGNOSIS — M25552 Pain in left hip: Secondary | ICD-10-CM

## 2018-12-25 DIAGNOSIS — I6930 Unspecified sequelae of cerebral infarction: Secondary | ICD-10-CM

## 2018-12-25 DIAGNOSIS — Z96649 Presence of unspecified artificial hip joint: Secondary | ICD-10-CM | POA: Diagnosis not present

## 2018-12-25 DIAGNOSIS — R531 Weakness: Secondary | ICD-10-CM | POA: Diagnosis not present

## 2018-12-25 MED ORDER — TRAZODONE HCL 50 MG PO TABS: 50.0000 mg | ORAL_TABLET | Freq: Every day | ORAL | 1 refills | Status: DC

## 2018-12-25 NOTE — Patient Instructions (Addendum)
Start Trazodone for sleep. If not effective 7-10 days increase to 2 , contact if needed  Merlene Morse will contact you with more information  Please schedule a Follow-up Appointment to: Return in about 4 weeks (around 01/22/2019), or if symptoms worsen or fail to improve.  If you have any other questions or concerns, please feel free to call the office or send a message through [place]. You may also schedule an earlier appointment if necessary.  Additionally, you may be receiving a survey about your experience at our office within a few days to 1 week by e-mail or mail. We value your feedback.  Nobie Putnam, DO [place]

## 2018-12-25 NOTE — Progress Notes (Signed)
Subjective:    Patient ID: Georgiann CockerLinda S Gilpin, female    DOB: Dec 07, 1940, 78 y.o.   MRN: [id number]  Georgiann CockerLinda S Rumer is a 78 y.o. female presenting on 12/25/2018 for Insomnia ([facility], tylenol and melatonin didn't help for sleeping )  Virtual / Telehealth Encounter - Telephone  The purpose of this virtual visit is to provide medical care while limiting exposure to the novel coronavirus (COVID19) for both patient and office staff.  Consent was obtained for remote visit:  Yes.   Answered questions that patient had about telehealth interaction:  Yes.   I discussed the limitations, risks, security and privacy concerns of performing an evaluation and management service by video/telephone. I also discussed with the patient that there may be a patient responsible charge related to this service. The patient expressed understanding and agreed to proceed.  Patient Location: Home Provider Location: [facility] (Office)  History is provided by patients daughter, Vernie ShanksLori Boone, patient is present but not able to provide history to me due to dementia.  HPI  HOSPITAL FOLLOW-UP VISIT  Hospital/Location: [facility] Date of Admission: 12/17/18 Date of Discharge: 12/23/18 Transitions of care telephone call: [facility]  Reason for Admission: Acute Stroke Primary (+Secondary) Diagnosis: Acute L pontine infarct, L periprosthetic hip fracture, Vascular dementia with behavioral disturbance, Depression, Insomnia  - Hospital H&P and Discharge Summary have been reviewed - Patient presents today 8 days after recent hospitalization. Previously patient was at hospital earlier in 2020 for hip fracture, then [facility]. Brief summary of recent course, patient had symptoms of acute worsening weakness from fall L hip pain, and confusion, hospitalized after found to have acute stroke, on MRI, identified periprosthetic hip fracture -Repeat x-rays  now showing a new incomplete subtrochanteric hip fracture of the left hip., with vascular dementia, she had testing and PT OT eval recommended rehab SNF but she has used up her rehab days and was discharged home instead with home health services.  - Today reports overall has not done well after discharge. Symptoms of pain has improved in hip, difficult to know if she is experiencing what degree of pain by report, she is receiving tylenol regularly with relief, and now on Tramadol PRN pain, instead of oxycodone or other stronger meds from [facility]. She is on methocarbamol. - Primary issue now is related to insomnia, and her recent stroke / mental health issues, reported to have insomnia cannot sleep at all, taking fluoxetine and ativan, limited benefit, also try melatonin and tylenol pm without relief. She had better caregiver support but this has not able to be implemented, has overnight caregiver sitter but they cannot help if she does not sleep at all. There is significant caregiver fatigue and stress expressed to me. She is not complaining of pain to cause her insomnia. - daughter agrees, that she is not appropriate to be at home, she is contacting social services for long-term care medicaid but was told 45 day turn around, she has case manager contacting her already as well, and awaiting home health later this week - She is very debilitated, weak - She has has hospital bed with bedrail, cannot transfer cannot weight bear - Admits anxious, afraid at times, confusion episodes - weight stable, appetite okay  Denies new fever chills fall, urinary symptoms, dyspnea cough, acute confusion  I have reviewed the discharge medication list, and have reconciled the current and discharge medications today.   Current Outpatient Medications:  .  acetaminophen (TYLENOL) 500 MG tablet, Take 500 mg by mouth 3 (three) times daily. , Disp: , Rfl:  .  alendronate (FOSAMAX) 70 MG tablet, TAKE 1 TABLET EVERY 7 DAYS.  SEE  PACKAGE FOR ADDITIONAL INSTRUCTIONS (Patient taking differently: Take 70 mg by mouth every Monday. ), Disp: 12 tablet, Rfl: 3 .  amLODipine (NORVASC) 10 MG tablet, TAKE 1 TABLET BY MOUTH EVERY DAY (Patient taking differently: Take 10 mg by mouth daily. ), Disp: 90 tablet, Rfl: 1 .  aspirin 81 MG tablet, Take 1 tablet (81 mg total) by mouth daily., Disp: , Rfl:  .  atorvastatin (LIPITOR) 40 MG tablet, Take 1 tablet (40 mg total) by mouth daily at 6 PM., Disp: 30 tablet, Rfl: 2 .  Cholecalciferol (VITAMIN D3) 125 MCG (5000 UT) CAPS, Take 1 capsule (5,000 Units total) by mouth daily. For 12 weeks, then start Vitamin D3 2,000 units daily (OTC), Disp: 30 capsule, Rfl: 2 .  clopidogrel (PLAVIX) 75 MG tablet, Take 1 tablet (75 mg total) by mouth daily., Disp: 30 tablet, Rfl: 2 .  Cranberry-Vitamin C (AZO CRANBERRY URINARY TRACT PO), Take 2 tablets by mouth daily., Disp: , Rfl:  .  docusate sodium (COLACE) 100 MG capsule, Take 1 capsule (100 mg total) by mouth 2 (two) times daily., Disp: 10 capsule, Rfl: 0 .  feeding supplement, ENSURE ENLIVE, (ENSURE ENLIVE) LIQD, Take 237 mLs by mouth 2 (two) times daily between meals., Disp: 237 mL, Rfl: 12 .  FLUoxetine (PROZAC) 40 MG capsule, Take 1 capsule (40 mg total) by mouth daily., Disp: 90 capsule, Rfl: 3 .  lisinopril (PRINIVIL,ZESTRIL) 40 MG tablet, Take 1 tablet (40 mg total) by mouth daily., Disp: 90 tablet, Rfl: 3 .  LORazepam (ATIVAN) 0.5 MG tablet, Take 1 tablet (0.5 mg total) by mouth at bedtime as needed for anxiety or sleep., Disp: 20 tablet, Rfl: 0 .  methocarbamol (ROBAXIN) 500 MG tablet, Take 1 tablet (500 mg total) by mouth 3 (three) times daily for 5 days., Disp: 15 tablet, Rfl: 0 .  Multiple Vitamins-Minerals (MULTIVITAMIN WITH MINERALS) tablet, Take 1 tablet by mouth daily., Disp: , Rfl:  .  pantoprazole (PROTONIX) 20 MG tablet, Take 20 mg by mouth daily., Disp: , Rfl:  .  potassium chloride (K-DUR,KLOR-CON) 10 MEQ tablet, Take 1 tablet (10 mEq  total) by mouth daily., Disp: 90 tablet, Rfl: 3 .  ipratropium (ATROVENT) 0.06 % nasal spray, Place 2 sprays into both nostrils 4 (four) times daily. For up to 5-7 days then stop. (Patient not taking: Reported on 12/25/2018), Disp: 15 mL, Rfl: 0 .  polyethylene glycol (MIRALAX / GLYCOLAX) 17 g packet, Take 17 g by mouth daily as needed for mild constipation., Disp: 14 each, Rfl: 0 .  traMADol (ULTRAM) 50 MG tablet, Take 1 tablet (50 mg total) by mouth every 6 (six) hours as needed for moderate pain or severe pain. (Patient not taking: Reported on 12/25/2018), Disp: 30 tablet, Rfl: 0 .  traZODone (DESYREL) 50 MG tablet, Take 1 tablet (50 mg total) by mouth at bedtime. May increase to 100mg  if needed after 1-2 weeks, Disp: 30 tablet, Rfl: 1  Depression screen [facility] 2/9 12/25/2018 04/10/2018 03/21/2018  Decreased Interest 2 1 0  Down, Depressed, Hopeless 3 0 0  PHQ - 2 Score 5 1 0  Altered sleeping 3 0 -  Tired, decreased energy 3 2 -  Change in appetite 1 0 -  Feeling bad or failure about yourself  2 2 -  Trouble  concentrating 3 0 -  Moving slowly or fidgety/restless 0 0 -  Suicidal thoughts 0 0 -  PHQ-9 Score 17 5 -  Difficult doing work/chores Extremely dIfficult Not difficult at all -  Some recent data might be hidden    GAD 7 : Generalized Anxiety Score 12/25/2018 04/10/2018  Nervous, Anxious, on Edge 3 3  Control/stop worrying 2 3  Worry too much - different things 2 0  Trouble relaxing 3 2  Restless 0 0  Easily annoyed or irritable 1 3  Afraid - awful might happen 3 0  Total GAD 7 Score 14 11  Anxiety Difficulty Somewhat difficult Not difficult at all    ------------------------------------------------------------------------- Social History   Tobacco Use  . Smoking status: Never Smoker  . Smokeless tobacco: Never Used  Substance Use Topics  . Alcohol use: No    Alcohol/week: 0.0 standard drinks  . Drug use: No    Review of Systems Per HPI unless specifically indicated above      Objective:    There were no vitals taken for this visit.  Wt Readings from Last 3 Encounters:  12/17/18 154 lb (69.9 kg)  11/21/18 150 lb (68 kg)  04/10/18 158 lb (71.7 kg)    Physical Exam   Visit conducted remotely by phone. No exam.  Results for orders placed or performed during the hospital encounter of 12/17/18  SARS Coronavirus 2 (CEPHEID- Performed in [facility]), [facility]   Specimen: Nasopharyngeal Swab  Result Value Ref Range   SARS Coronavirus 2 NEGATIVE NEGATIVE  Basic metabolic panel  Result Value Ref Range   Sodium 134 (L) 135 - 145 mmol/L   Potassium 3.6 3.5 - 5.1 mmol/L   Chloride 101 98 - 111 mmol/L   CO2 25 22 - 32 mmol/L   Glucose, Bld 107 (H) 70 - 99 mg/dL   BUN 12 8 - 23 mg/dL   Creatinine, Ser 1.610.73 0.44 - 1.00 mg/dL   Calcium 9.0 8.9 - 09.610.3 mg/dL   GFR calc non Af Amer >60 >60 mL/min   GFR calc Af Amer >60 >60 mL/min   Anion gap 8 5 - 15  CBC with Differential  Result Value Ref Range   WBC 8.6 4.0 - 10.5 K/uL   RBC 3.93 3.87 - 5.11 MIL/uL   Hemoglobin 11.2 (L) 12.0 - 15.0 g/dL   HCT 04.535.1 (L) 40.936.0 - 81.146.0 %   MCV 89.3 80.0 - 100.0 fL   MCH 28.5 26.0 - 34.0 pg   MCHC 31.9 30.0 - 36.0 g/dL   RDW 91.415.1 78.211.5 - 95.615.5 %   Platelets 456 (H) 150 - 400 K/uL   nRBC 0.0 0.0 - 0.2 %   Neutrophils Relative % 70 %   Neutro Abs 6.1 1.7 - 7.7 K/uL   Lymphocytes Relative 19 %   Lymphs Abs 1.6 0.7 - 4.0 K/uL   Monocytes Relative 8 %   Monocytes Absolute 0.7 0.1 - 1.0 K/uL   Eosinophils Relative 2 %   Eosinophils Absolute 0.2 0.0 - 0.5 K/uL   Basophils Relative 0 %   Basophils Absolute 0.0 0.0 - 0.1 K/uL   Immature Granulocytes 1 %   Abs Immature Granulocytes 0.05 0.00 - 0.07 K/uL  Troponin I - Once  Result Value Ref Range   Troponin I 0.07 (HH) <0.03 ng/mL  Lactic acid, plasma  Result Value Ref Range   Lactic Acid, Venous 1.3 0.5 - 1.9 mmol/L  Urinalysis, Complete w Microscopic  Result  Value Ref Range   Color, Urine YELLOW (A) YELLOW    APPearance CLEAR (A) CLEAR   Specific Gravity, Urine 1.008 1.005 - 1.030   pH 6.0 5.0 - 8.0   Glucose, UA NEGATIVE NEGATIVE mg/dL   Hgb urine dipstick NEGATIVE NEGATIVE   Bilirubin Urine NEGATIVE NEGATIVE   Ketones, ur NEGATIVE NEGATIVE mg/dL   Protein, ur NEGATIVE NEGATIVE mg/dL   Nitrite NEGATIVE NEGATIVE   Leukocytes,Ua NEGATIVE NEGATIVE   RBC / HPF 0-5 0 - 5 RBC/hpf   WBC, UA 0-5 0 - 5 WBC/hpf   Bacteria, UA RARE (A) NONE SEEN   Squamous Epithelial / LPF 0-5 0 - 5  Troponin I - Once-Timed  Result Value Ref Range   Troponin I 0.06 (HH) <0.03 ng/mL  Lipid panel  Result Value Ref Range   Cholesterol 134 0 - 200 mg/dL   Triglycerides 098107 <119<150 mg/dL   HDL 60 >14>40 mg/dL   Total CHOL/HDL Ratio 2.2 RATIO   VLDL 21 0 - 40 mg/dL   LDL Cholesterol 53 0 - 99 mg/dL  Basic metabolic panel  Result Value Ref Range   Sodium 138 135 - 145 mmol/L   Potassium 3.6 3.5 - 5.1 mmol/L   Chloride 101 98 - 111 mmol/L   CO2 25 22 - 32 mmol/L   Glucose, Bld 102 (H) 70 - 99 mg/dL   BUN 8 8 - 23 mg/dL   Creatinine, Ser 7.820.71 0.44 - 1.00 mg/dL   Calcium 8.9 8.9 - 95.610.3 mg/dL   GFR calc non Af Amer >60 >60 mL/min   GFR calc Af Amer >60 >60 mL/min   Anion gap 12 5 - 15  CBC  Result Value Ref Range   WBC 11.1 (H) 4.0 - 10.5 K/uL   RBC 4.14 3.87 - 5.11 MIL/uL   Hemoglobin 11.7 (L) 12.0 - 15.0 g/dL   HCT 21.335.8 (L) 08.636.0 - 57.846.0 %   MCV 86.5 80.0 - 100.0 fL   MCH 28.3 26.0 - 34.0 pg   MCHC 32.7 30.0 - 36.0 g/dL   RDW 46.914.5 62.911.5 - 52.815.5 %   Platelets 441 (H) 150 - 400 K/uL   nRBC 0.0 0.0 - 0.2 %  Basic metabolic panel  Result Value Ref Range   Sodium 132 (L) 135 - 145 mmol/L   Potassium 3.4 (L) 3.5 - 5.1 mmol/L   Chloride 96 (L) 98 - 111 mmol/L   CO2 25 22 - 32 mmol/L   Glucose, Bld 102 (H) 70 - 99 mg/dL   BUN 17 8 - 23 mg/dL   Creatinine, Ser 4.130.83 0.44 - 1.00 mg/dL   Calcium 9.0 8.9 - 24.410.3 mg/dL   GFR calc non Af Amer >60 >60 mL/min   GFR calc Af Amer >60 >60 mL/min   Anion gap 11 5 - 15   ECHOCARDIOGRAM COMPLETE  Result Value Ref Range   Weight 2,464 oz   Height 62 in   BP 134/71 mmHg      Assessment & Plan:   Problem List Items Addressed This Visit    Dementia, vascular (HCC) - Primary   Relevant Medications   traZODone (DESYREL) 50 MG tablet   History of cerebrovascular accident (CVA) with residual deficit   Major depression, recurrent, chronic (HCC)   Relevant Medications   traZODone (DESYREL) 50 MG tablet    Other Visit Diagnoses    Primary insomnia       Relevant Medications   traZODone (DESYREL) 50 MG tablet   Generalized  weakness       Periprosthetic fracture of hip, initial encounter       Pain of left hip joint         Clinically with multiple acute on chronic concerns, following recent hospitalization Primary issues related to patient discharged home despite recommended SNF rehab but she has used up all of her days at SNF already this year, from previous hip fracture.  Now acute stroke with residual deficits worsening in setting of known multiple prior CVA and deficits, including cognitive deficits with vascular dementia as a result - Complex because of known psychiatric mood and anxiety disorder as well, underlying Associated Insomnia is another significant issue affecting patient and family/caregivers now  Significant caregiver stress and increased burden at this time, not able to provide the full support needed at home, only temporary, has overnight sitter but difficulty with patient not sleeping  Plan For insomnia - start Trazodone  nightly, if ineffective 7 days, increase to 2 for dose  nightly, caution serotonin syndrome, continue fluoxetine and ativan for now. - If not improving, next option would be Remeron - Caution with other hypnotic sleep agents, would be last resort - Additionally, I explicitly advised patient's daughter that given her complexity multiple related issues with dementia, strokes, mood/anxiety that her insomnia may  not be easy to treat, and ultimately may need 2nd opinion mental health professional  For weakness, deconditioning, left hip periprosthetic fracture with pain - Need to increase support at home, anticipating [facility] home health soon, start of Friday, with social worker, to determine if any other level of care options available - Chronic Care management Case Manager already working on case, to call patient's daughter today, they will use existing FL2 from hospital, and check into options for placement for long term care - Also she will check [place] [facility] for respite money for any emergency grant options  For pain control - can continue tylenol, existing Tramadol PRN - seems to be effective, does not seem primary issue at this time.    Meds ordered this encounter  Medications  . traZODone (DESYREL) 50 MG tablet    Sig: Take 1 tablet (50 mg total) by mouth at bedtime. May increase to  if needed after 1-2 weeks    Dispense:  30 tablet    Refill:  1    Follow up plan: Return in about 4 weeks (around 01/22/2019), or if symptoms worsen or fail to improve.   Saralyn Pilar, DO [facility] 12/25/2018, 3:52 PM

## 2018-12-25 NOTE — Chronic Care Management (AMB) (Signed)
Chronic Care Management   Initial Visit Note  12/25/2018 Name: Jill Hancock MRN: [id number] DOB: 28-Jan-1941  Referred by: Olin Hauser, DO Reason for referral : Care Coordination (Patient needing LTC )   [name] is a 78 y.o. year old female who is a primary care patient of Olin Hauser, DO. The CCM team was consulted for assistance with chronic disease management and care coordination needs.   Review of patient status, including review of consultants reports, relevant laboratory and other test results, and collaboration with appropriate care team members and the patient's provider was performed as part of comprehensive patient evaluation and provision of chronic care management services.    SDOH (Social Determinants of Health) screening performed today. See Care Plan Entry related to challenges with: Stress, Dementia,Need for LTC Placement   Objective:   Goals Addressed            This Visit's Progress   . We need help getting mom into long term care. We are unable to care for her. (pt-stated)       Current Barriers:  Marland Kitchen Knowledge Deficits related to the process of pursuing LTC for patient from home . Lacks caregiver support.  . Film/video editor.  . Cognitive Deficits . Daughters unable to care for mother with dementia and broken hip with behavior disturbances at home.  Nurse Case Manager Clinical Goal(s):  Marland Kitchen Over the next 30 days RNCM will work with family to have respite in the home and transfer the patient to a LTC facility.   Interventions:  . Discussed plans with patient for ongoing care management follow up and provided patient with direct contact information for care management team . Spoke with [facility] LPN who took the call for [facility] to inquire about patient/family needs . EMR review revealed patient with orders for [facility] SW through [place] at discharge . [place] and requested the SW assigned Gabby  Boni contact me. [place] gave SW my contact info. Nash Dimmer with PCP about patient's current needs . Discussed with Daughter Jill Hancock, family/patient needs. Daughter stated her mother is extremely confused, and does not sleep/rest at all. She and her sister feel extremely inadequate to care for the patient. Daughter Jill Hancock said she did  send in the LTC Medicaid application today.  Kandice Rob[facility] to inquire about respite care until patient can be placed. . Discussed possible solutions with embedded team lead Alisa.  . Plan to continue to assist family with possible solutions for assistance tomorrow  Patient Self Care Activities:  . Currently UNABLE TO independently care for patient at home requesting assistance getting patient to LTC   Initial goal documentation         Ms. Schepers's daughter Jill Hancock was given information about Chronic Care Management services today including:  1. CCM service includes personalized support from designated clinical staff supervised by her physician, including individualized plan of care and coordination with other care providers 2. 24/7 contact phone numbers for assistance for urgent and routine care needs. 3. Service will only be billed when office clinical staff spend 20 minutes or more in a month to coordinate care. 4. Only one practitioner may furnish and bill the service in a calendar month. 5. The patient may stop CCM services at any time (effective at the end of the month) by phone call to the office staff. 6. The patient will be responsible for cost sharing (co-pay)  of up to 20% of the service fee (after annual deductible is met).  Patient agreed to services and verbal consent obtained. ( by caregiver)  Plan:   The care management team will reach out to the patient again over the next 2 days.  The patient has been provided with contact information for the care management team and has been advised to call  with any health related questions or concerns.     RN, BSN Nurse Case Manager [facility] Care Management  ([phone]) Business Mobile    

## 2018-12-25 NOTE — Telephone Encounter (Signed)
Spoke with patients daughter Jill Hancock   Transition Care Management Follow-up Telephone Call  Date of discharge and from where: 12/23/2018 at [facility]  How have you been since you were released from the hospital? "she is not sleeping and she says she's in pain"   Any questions or concerns? Yes  per Jill Hancock patients daughter her main concern is  she cannot do anything currently- they are having to rotate people to stay with her, advanced home care came out to do there assessment yesterday and she is not sleeping at all. They have tried melatonin and tylenol PM per pharmacy and it didn't work, she didn't go to sleep until [name] she is working on paperwork for longterm care medicaid, but just isnt sure what all she needs to do at this point, Jill Hancock states she doesn't understand why she wont sleep and seems confused per other daughter she took off her depends and when they asked her why she said she didn't know. States if no one is in the room she will scream help and when they go in there she doesn't specify what she needs. Patient also states she is in pain but doesn't say where the pain is.   Will placed referral to CCM team for assistance in getting patient in to long term care. The concern currently for long term is financial.   Items Reviewed:  Did the pt receive and understand the discharge instructions provided? Yes   Medications obtained and verified? Yes   Any new allergies since your discharge? No   Dietary orders reviewed? Yes  Do you have support at home? Yes   Functional Questionnaire: (I = Independent and D = Dependent) ADLs:   Bathing/Dressing- d  Meal Prep- d  Eating- I  Maintaining continence- D, wears depends  Transferring/Ambulation- D  Managing Meds- d  Follow up appointments reviewed:   PCP Hospital f/u appt confirmed? Yes  Scheduled to see Dr. Parks Ranger via phone call on 12/25/2018 @ 3:40pm.  [facility] f/u appt confirmed? n/a  Are transportation  arrangements needed? No   If their condition worsens, is the pt aware to call PCP or go to the Emergency Dept.? Yes  Was the patient provided with contact information for the PCP's office or ED? Yes  Was to pt encouraged to call back with questions or concerns? Yes

## 2018-12-25 NOTE — Patient Instructions (Signed)
Thank you allowing the Chronic Care Management Team to be a part of your care! It was a pleasure speaking with you today!  CCM (Chronic Care Management) Team   Neria Procter RN, BSN Nurse Care Coordinator  [phone]  Harlow Asa PharmD  Clinical Pharmacist  [phone]  Eula Fried LCSW Clinical Social Worker [phone]  Goals Addressed            This Visit's Progress   . We need help getting mom into long term care. We are unable to care for her. (pt-stated)       Current Barriers:  Marland Kitchen Knowledge Deficits related to the process of pursuing LTC for patient from home . Lacks caregiver support.  . Film/video editor.  . Cognitive Deficits . Daughters unable to care for mother with dementia and broken hip with behavior disturbances at home.  Nurse Case Manager Clinical Goal(s):  Marland Kitchen Over the next 30 days RNCM will work with family to have respite in the home and transfer the patient to a LTC facility.   Interventions:  . Discussed plans with patient for ongoing care management follow up and provided patient with direct contact information for care management team . Spoke with [facility] LPN who took the call for [facility] to inquire about patient/family needs . EMR review revealed patient with orders for [facility] SW through [place] at discharge . [place] and requested the SW assigned Gabby Boni contact me. [place] gave SW my contact info. Nash Dimmer with PCP about patient's current needs . Discussed with Daughter [name], family/patient needs. Daughter stated her mother is extremely confused, and does not sleep/rest at all. She and her sister feel extremely inadequate to care for the patient. Daughter Cecille Rubin said she did  send in the LTC Medicaid application today.  Kandice Robinsons [facility] to inquire about respite care until patient can be placed. . Discussed possible solutions with embedded team lead Alisa.   . Plan to continue to assist family with possible solutions for assistance tomorrow  Patient Self Care Activities:  . Currently UNABLE TO independently care for patient at home requesting assistance getting patient to LTC   Initial goal documentation        The patient verbalized understanding of instructions provided today and declined a print copy of patient instruction materials.   The patient has been provided with contact information for the care management team and has been advised to call with any health related questions or concerns.

## 2018-12-26 ENCOUNTER — Ambulatory Visit (INDEPENDENT_AMBULATORY_CARE_PROVIDER_SITE_OTHER): Payer: Medicare HMO | Admitting: *Deleted

## 2018-12-26 DIAGNOSIS — I10 Essential (primary) hypertension: Secondary | ICD-10-CM | POA: Diagnosis not present

## 2018-12-26 DIAGNOSIS — M9702XD Periprosthetic fracture around internal prosthetic left hip joint, subsequent encounter: Secondary | ICD-10-CM | POA: Diagnosis not present

## 2018-12-26 DIAGNOSIS — M810 Age-related osteoporosis without current pathological fracture: Secondary | ICD-10-CM | POA: Diagnosis not present

## 2018-12-26 DIAGNOSIS — I6930 Unspecified sequelae of cerebral infarction: Secondary | ICD-10-CM

## 2018-12-26 DIAGNOSIS — F0150 Vascular dementia without behavioral disturbance: Secondary | ICD-10-CM | POA: Diagnosis not present

## 2018-12-26 DIAGNOSIS — F01518 Vascular dementia, unspecified severity, with other behavioral disturbance: Secondary | ICD-10-CM

## 2018-12-26 DIAGNOSIS — F419 Anxiety disorder, unspecified: Secondary | ICD-10-CM | POA: Diagnosis not present

## 2018-12-26 DIAGNOSIS — F0151 Vascular dementia with behavioral disturbance: Secondary | ICD-10-CM

## 2018-12-26 DIAGNOSIS — R531 Weakness: Secondary | ICD-10-CM | POA: Diagnosis not present

## 2018-12-26 DIAGNOSIS — S72142D Displaced intertrochanteric fracture of left femur, subsequent encounter for closed fracture with routine healing: Secondary | ICD-10-CM | POA: Diagnosis not present

## 2018-12-26 DIAGNOSIS — I69398 Other sequelae of cerebral infarction: Secondary | ICD-10-CM | POA: Diagnosis not present

## 2018-12-26 DIAGNOSIS — S72392D Other fracture of shaft of left femur, subsequent encounter for closed fracture with routine healing: Secondary | ICD-10-CM | POA: Diagnosis not present

## 2018-12-26 NOTE — Chronic Care Management (AMB) (Signed)
  Chronic Care Management   Follow Up Note   12/26/2018 Name: NIOMIE ENGLERT MRN: [id number] DOB: 03/14/1941  Referred by: Olin Hauser, DO Reason for referral : Care Coordination (Caregiver/LTC needs)   GREER WAINRIGHT is a 78 y.o. year old female who is a primary care patient of Olin Hauser, DO. The CCM team was consulted for assistance with chronic disease management and care coordination needs.    Review of patient status, including review of consultants reports, relevant laboratory and other test results, and collaboration with appropriate care team members and the patient's provider was performed as part of comprehensive patient evaluation and provision of chronic care management services.    Goals Addressed            This Visit's Progress   . We need help getting mom into long term care. We are unable to care for her. (pt-stated)       Current Barriers:  Marland Kitchen Knowledge Deficits related to the process of pursuing LTC for patient from home . Lacks caregiver support.  . Film/video editor.  . Cognitive Deficits . Daughters unable to care for mother with dementia and broken hip with behavior disturbances at home.  Nurse Case Manager Clinical Goal(s):  Marland Kitchen Over the next 30 days RNCM will work with family to have respite in the home and transfer the patient to a LTC facility.   Interventions:  . Discussed with [facility] director solutions to assisting patient's family obtaining further respite care. Advised for family to continue to reach out to the [facility] office each week to inquire about LTC Medicaid application status.  Marland Kitchen Spoke with Tammy from [employer] who  stated they would have respite hours available 01/01/19 and explained the process for family to obtain this assistance. . Placed a call to [facility] spoke with Langley Gauss to request a referral to assist with symptom management and also social work. Hulen Skains patient's daughter Cecille Rubin and  spoke with her about [place] [facility] hours, calling the Medicaid office regularly, Palliative care referral and to continue to try to reach the SW with [facility]. Daughter did state patient rested better last night and [facility] PT and nurse assistant did come and see patient today. She has not heard from [facility].   Patient Self Care Activities:  . Currently UNABLE TO independently care for patient at home requesting assistance getting patient to LTC   Please see past updates related to this goal by clicking on the "Past Updates" button in the selected goal          The care management team will reach out to the patient again over the next 7 days.  The patient has been provided with contact information for the care management team and has been advised to call with any health related questions or concerns.    Merlene Morse Tonnette Zwiebel RN, BSN Nurse Case Pharmacist, [facility]  [phone]) Business Mobile

## 2018-12-26 NOTE — Patient Instructions (Signed)
Thank you allowing the Chronic Care Management Team to be a part of your care! It was a pleasure speaking with you today!   CCM (Chronic Care Management) Team   Clayten Allcock RN, BSN Nurse Care Coordinator  [phone]  Harlow Asa PharmD  Clinical Pharmacist  [phone]  Eula Fried LCSW Clinical Social Worker [phone]  Goals Addressed            This Visit's Progress   . We need help getting mom into long term care. We are unable to care for her. (pt-stated)       Current Barriers:  Marland Kitchen Knowledge Deficits related to the process of pursuing LTC for patient from home . Lacks caregiver support.  . Film/video editor.  . Cognitive Deficits . Daughters unable to care for mother with dementia and broken hip with behavior disturbances at home.  Nurse Case Manager Clinical Goal(s):  Marland Kitchen Over the next 30 days RNCM will work with family to have respite in the home and transfer the patient to a LTC facility.   Interventions:  . Discussed with [facility] director solutions to assisting patient's family obtaining further respite care. Advised for family to continue to reach out to the [facility] office each week to inquire about LTC Medicaid application status.  Marland Kitchen Spoke with Tammy from [employer] who  stated they would have respite hours available 01/01/19 and explained the process for family to obtain this assistance. . Placed a call to [facility] spoke with Langley Gauss to request a referral to assist with symptom management and also social work. Hulen Skains patient's daughter Cecille Rubin and spoke with her about [place] [facility] hours, calling the Medicaid office regularly, Palliative care referral and to continue to try to reach the SW with [facility]. Daughter did state patient rested better last night and [facility] PT and nurse assistant did come and see patient today. She has not heard from [facility].   Patient Self Care Activities:  . Currently UNABLE  TO independently care for patient at home requesting assistance getting patient to LTC   Please see past updates related to this goal by clicking on the "Past Updates" button in the selected goal         The patient verbalized understanding of instructions provided today and declined a print copy of patient instruction materials.   The patient has been provided with contact information for the care management team and has been advised to call with any health related questions or concerns.

## 2018-12-27 ENCOUNTER — Ambulatory Visit: Payer: Medicare HMO | Admitting: Family Medicine

## 2018-12-27 ENCOUNTER — Telehealth: Payer: Self-pay | Admitting: Primary Care

## 2018-12-27 ENCOUNTER — Ambulatory Visit: Payer: Self-pay | Admitting: *Deleted

## 2018-12-27 ENCOUNTER — Telehealth: Payer: Self-pay | Admitting: Family Medicine

## 2018-12-27 DIAGNOSIS — F01518 Vascular dementia, unspecified severity, with other behavioral disturbance: Secondary | ICD-10-CM

## 2018-12-27 DIAGNOSIS — R531 Weakness: Secondary | ICD-10-CM | POA: Diagnosis not present

## 2018-12-27 DIAGNOSIS — S72142D Displaced intertrochanteric fracture of left femur, subsequent encounter for closed fracture with routine healing: Secondary | ICD-10-CM | POA: Diagnosis not present

## 2018-12-27 DIAGNOSIS — F419 Anxiety disorder, unspecified: Secondary | ICD-10-CM | POA: Diagnosis not present

## 2018-12-27 DIAGNOSIS — S72392D Other fracture of shaft of left femur, subsequent encounter for closed fracture with routine healing: Secondary | ICD-10-CM | POA: Diagnosis not present

## 2018-12-27 DIAGNOSIS — I10 Essential (primary) hypertension: Secondary | ICD-10-CM | POA: Diagnosis not present

## 2018-12-27 DIAGNOSIS — M9702XD Periprosthetic fracture around internal prosthetic left hip joint, subsequent encounter: Secondary | ICD-10-CM | POA: Diagnosis not present

## 2018-12-27 DIAGNOSIS — F0150 Vascular dementia without behavioral disturbance: Secondary | ICD-10-CM | POA: Diagnosis not present

## 2018-12-27 DIAGNOSIS — F0151 Vascular dementia with behavioral disturbance: Secondary | ICD-10-CM

## 2018-12-27 DIAGNOSIS — I69398 Other sequelae of cerebral infarction: Secondary | ICD-10-CM | POA: Diagnosis not present

## 2018-12-27 DIAGNOSIS — M810 Age-related osteoporosis without current pathological fracture: Secondary | ICD-10-CM | POA: Diagnosis not present

## 2018-12-27 NOTE — Telephone Encounter (Signed)
Izora Gala with OT  Called [phone] have question about pt  medication

## 2018-12-27 NOTE — Chronic Care Management (AMB) (Signed)
  Chronic Care Management   Follow Up Note   12/27/2018 Name: Jill Hancock MRN: [id number] DOB: [birth year]  Referred by: Olin Hauser, DO Reason for referral : Care Coordination (LTC placement goals)   Jill Hancock is a 78 y.o. year old female who is a primary care patient of Olin Hauser, DO. The CCM team was consulted for assistance with chronic disease management and care coordination needs.    Review of patient status, including review of consultants reports, relevant laboratory and other test results, and collaboration with appropriate care team members and the patient's provider was performed as part of comprehensive patient evaluation and provision of chronic care management services.    Goals Addressed            This Visit's Progress   . We need help getting mom into long term care. We are unable to care for her. (pt-stated)       Current Barriers:  Marland Kitchen Knowledge Deficits related to the process of pursuing LTC for patient from home . Lacks caregiver support.  . Film/video editor.  . Cognitive Deficits . Daughters unable to care for mother with dementia and broken hip with behavior disturbances at home.  Nurse Case Manager Clinical Goal(s):  Marland Kitchen Over the next 30 days RNCM will work with family to have respite in the home and transfer the patient to a LTC facility.   Interventions:  . Incoming call from [place] with [facility]. Collaborated on family's desire for patient to go to LTC. Janace Hoard has plans to see family today at 11am and plans to guide family through the process.   Patient Self Care Activities:  . Currently UNABLE TO independently care for patient at home requesting assistance getting patient to LTC   Please see past updates related to this goal by clicking on the "Past Updates" button in the selected goal          The care management team will reach out to the patient again over the next 7 days.  The patient  has been provided with contact information for the care management team and has been advised to call with any health related questions or concerns.    Merlene Morse Wilna Pennie RN, BSN Nurse Case Pharmacist, [facility]  [phone]) Business Mobile

## 2018-12-27 NOTE — Telephone Encounter (Signed)
Spoke with patient's daughter [name] and have scheduled a Telephone Palliative Consult for 12/30/18 @ 11 AM.  Verbal consent rec'd from daughter for Palliative.

## 2018-12-27 NOTE — Telephone Encounter (Signed)
Acknowledged. Verbal orders given.  Nobie Putnam, [facility] 12/27/2018, 4:57 PM

## 2018-12-27 NOTE — Telephone Encounter (Signed)
Jill Hancock, OT from [facility] was calling requesting a verbal order for 1 week 1 to cover her occupational therapy  visit today. Also in 2 weeks she would like to continue care for 2 weeks 2. She will be skipping next week, because of vacation.  The patient has 24 hr care and the caregiver okayed her skipping next week. Verbal order was given. I requested that she fax over the order.

## 2018-12-30 ENCOUNTER — Ambulatory Visit: Payer: Self-pay | Admitting: *Deleted

## 2018-12-30 ENCOUNTER — Telehealth: Payer: Self-pay | Admitting: Family Medicine

## 2018-12-30 ENCOUNTER — Other Ambulatory Visit: Payer: Self-pay

## 2018-12-30 ENCOUNTER — Other Ambulatory Visit: Payer: Self-pay | Admitting: Primary Care

## 2018-12-30 DIAGNOSIS — F5101 Primary insomnia: Secondary | ICD-10-CM

## 2018-12-30 DIAGNOSIS — M9702XD Periprosthetic fracture around internal prosthetic left hip joint, subsequent encounter: Secondary | ICD-10-CM | POA: Diagnosis not present

## 2018-12-30 DIAGNOSIS — R531 Weakness: Secondary | ICD-10-CM | POA: Diagnosis not present

## 2018-12-30 DIAGNOSIS — I10 Essential (primary) hypertension: Secondary | ICD-10-CM | POA: Diagnosis not present

## 2018-12-30 DIAGNOSIS — I69398 Other sequelae of cerebral infarction: Secondary | ICD-10-CM | POA: Diagnosis not present

## 2018-12-30 DIAGNOSIS — Z515 Encounter for palliative care: Secondary | ICD-10-CM | POA: Diagnosis not present

## 2018-12-30 DIAGNOSIS — F419 Anxiety disorder, unspecified: Secondary | ICD-10-CM | POA: Diagnosis not present

## 2018-12-30 DIAGNOSIS — F0151 Vascular dementia with behavioral disturbance: Secondary | ICD-10-CM

## 2018-12-30 DIAGNOSIS — S72392D Other fracture of shaft of left femur, subsequent encounter for closed fracture with routine healing: Secondary | ICD-10-CM | POA: Diagnosis not present

## 2018-12-30 DIAGNOSIS — F01518 Vascular dementia, unspecified severity, with other behavioral disturbance: Secondary | ICD-10-CM

## 2018-12-30 DIAGNOSIS — S72142D Displaced intertrochanteric fracture of left femur, subsequent encounter for closed fracture with routine healing: Secondary | ICD-10-CM | POA: Diagnosis not present

## 2018-12-30 DIAGNOSIS — M810 Age-related osteoporosis without current pathological fracture: Secondary | ICD-10-CM | POA: Diagnosis not present

## 2018-12-30 DIAGNOSIS — F0150 Vascular dementia without behavioral disturbance: Secondary | ICD-10-CM | POA: Diagnosis not present

## 2018-12-30 NOTE — Chronic Care Management (AMB) (Signed)
  Chronic Care Management   Follow Up Note   12/30/2018 Name: Jill Hancock MRN: [id number] DOB: 07-10-1940  Referred by: Jill Hauser, Jill Hancock Reason for referral : Care Coordination (LTC placement)   Jill Hancock is a 78 y.o. year old female who is a primary care patient of Jill Hauser, Jill Hancock. The CCM team was consulted for assistance with chronic disease management and care coordination needs.    Review of patient status, including review of consultants reports, relevant laboratory and other test results, and collaboration with appropriate care team members and the patient's provider was performed as part of comprehensive patient evaluation and provision of chronic care management services.    Goals Addressed            This Visit's Progress   . We need help getting mom into long term care. We are unable to care for her. (pt-stated)       Current Barriers:  Marland Kitchen Knowledge Deficits related to the process of pursuing LTC for patient from home . Lacks caregiver support.  . Film/video editor.  . Cognitive Deficits . Daughters unable to care for mother with dementia and broken hip with behavior disturbances at home.  Nurse Case Manager Clinical Goal(s):  Marland Kitchen Over the next 30 days RNCM will work with family to have respite in the home and transfer the patient to a LTC facility.   Interventions:  . Incoming call from Jill Hancock with Jill Hancock. Collaborated on family's desire for patient to go to LTC. Jill Hancock discussed meeting with family she had on 12/27/18. She stated she was able to meet with patient and patient's daughter in law and speak with daughter Jill Hancock on the phone. Jill Hancock stated she has called some of the area facilities and plans to check with a few others but so far has been unable to find an available LTC Medicaid bed. Jill Hancock stated she was able to give the family a few more resources for in home care. Jill Hancock stated Palliative care services have  started for patient and she was also able to collaborate with their SW. Jill Hancock stated she has encouraged the family to continue to call around to facilities to find a bed and continue to call Medicaid office to inquire about LTC Medicaid application status.  . Also collaborated with CCM team SW Jill Hancock to update her on the patient's LTC placement process so far.   Patient Self Care Activities:  . Currently UNABLE TO independently care for patient at home requesting assistance getting patient to LTC   Please see past updates related to this goal by clicking on the "Past Updates" button in the selected goal          The care management team will reach out to the patient again over the next 14 days.  The patient has been provided with contact information for the care management team and has been advised to call with any health related questions or concerns.   Jill Hancock Jill Moorehead RN, BSN Nurse Case Pharmacist, [facility]  [phone]) Business Mobile

## 2018-12-30 NOTE — Progress Notes (Signed)
[facility] Consult Note Telephone: [phone]  Fax: [phone]  TELEHEALTH VISIT STATEMENT Due to the COVID-19 crisis, this visit was done via telemedicine from my office. It was initiated and consented to by this patient and/or family.  PATIENT NAME: Jill CockerLinda S Hancock DOB: 1940/12/20 MRN: [id number]  PRIMARY CARE PROVIDER:   Smitty CordsKaramalegos, Alexander J, DO [phone]  REFERRING PROVIDER:  Smitty CordsKaramalegos, Alexander J, DO [address] [place],  [place] [id number] [phone]  RESPONSIBLE PARTY:   Extended Emergency Contact Information Primary Emergency Contact: Jill NullBoone,Jill  [place] Mobile Phone: [phone] Relation: Daughter Secondary Emergency Contact: Jill GoldmannMurphy, Jill Hancock Mobile Phone: [phone] Relation: Daughter  Palliative Care was asked to follow this patient by consultation request of Smitty CordsKaramalegos, Alexander J, DO. This is initial visit made with POA Jill ShanksLori Hancock.  ASSESSMENT AND RECOMMENDATIONS:   1. Goals of Care: Maximize quality of life and symptom management.  2. Symptom Management:   Pain:  Recommend Acetaminophen Arthritis, ATC 650 mg q 8 hrs. Pain from secondary fracture on Left hip. Ortho recommends no further surgery and 50% weight bearing. Using muscle relaxer, don't know the name. They have a few left I recommend Tylenol for pain and increasing trazodone for insomnia.  Insomnia: Recommend wean and d/c lorazepam and increase Trazodone to 100 mg q hs.Has Trazodone 50 mg qhs with some relief, but does not sleep unitl 12 AM. Also have used melatonin ?? Mg. Has had lorazepam x 2 weeks which she takes at bedtime.   DME: Has hospital bed at home, and has hoyer lift as well but are not using the hoyer. Has a lift recliner and has been getting into it with total assistance. Transfers with 2 assistants and can't stand up/bear weight or use a walker. They are using gait belt. They are not offloading 50% weight as  ordered.  Agitation: Evidence of small stroke on recent MRI ,not sure when this occurred.  Has started to cry out for parents. Has agitation for no apparent reason but knows there are behavior issues with the dementia. Has pulled off clothing when she cannot dress herself. Will consider Seroquel if this persists but I want to address pain first.  Temperature Regulation: Feelings of being very hot or very cold, but daughter denies signs of sweat, clamminess, or cold limbs. Does not seem to be associated with fever.  Fall Risk: Significant risk, has h/o falls. [facility] and will address.  Constipation: Came home with impaction from hospital, requiring daughter to disimpact her. Miralax in coffee qam seems to have helped. I encouraged them to continue with goal of soft bm daily.  3. Family /Caregiver/Community Supports: Daughter states she is POA  and she is not in the home. States her sister-in law  is staying there at night and during the day. She has home health PT, OT and CNA. 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 She has also been calling for her mother and father.  Family is overwhelmed with care. She is appropriate for long term care, based on interview. Dr .Jill Hancock will be asked for FL2. They have spoken with [facility] [place]  And Jill Hancock at DSS RE the application for medicaid.This is pending. We put a plan in place for short term hiring for some respite, and hopefully treating pain and insomnia will allow caregivers to have more rest.  4. Cognitive / Functional decline:  Recently broke hip (  11/2018) and may have had another vascular incident. Not able to do any adls or iadls now except feed herself. She can sometimes follow directions, sometimes not.  5. Advanced Care Directive: Daughter is POA. Discussed advance planning, have not done much of this. We discussed DNR as well. Her daughter states her living will states she  does not want to be put on machines if she had a terminal illness or dementia. She states she elects DNR at this time.   States her mother stated at [facility] that she is dying. She may be exhibiting some s/sx of end of life. We discussed indication for hospice and what that means, when their goals of care would be consistent. Jill Hancock said she was not quite ready but was open to discussion when it seems more appropriate.  6. Follow up Palliative Care Visit: Palliative care will continue to follow for goals of care clarification and symptom management. Return 1 week  Or family to call if needed earlier, or prn  I spent 110 minutes providing this consultation,  from 1100 to 1250. More than 50% of the time in this consultation was spent coordinating communication.   HISTORY OF PRESENT ILLNESS:  Jill Hancock is a 78 y.o. year old female with multiple medical problems including vascular dementia, CVA, depression/anxiety, hypertension, left hip fracture with ORIF, frequent falls, osteoporosis. Palliative Care was asked to help address goals of care.   CODE STATUS:  DNR  PPS: 30% HOSPICE ELIGIBILITY/DIAGNOSIS: TBD  PAST MEDICAL HISTORY:  Past Medical History:  Diagnosis Date   Anxiety    Depression    Hyperglycemia    Hypertension    Hypokalemia    Osteoporosis    Stroke ([facility]) 02/2012   MRI revealed at least 3 subcentimeter acute infarctions with one area of subacute infarction in widely disparate vascular territories including territory of left cerebellum and around right caudate nucleus along with widespread lacunar infarcts, chronic microvascular ischemic change, and numerous microbleeds suggesting long standing hypertensive cerebrovascular disease.  MRA confirmed intracranial   Unsteady gait    Vertigo     SOCIAL HX:  Social History   Tobacco Use   Smoking status: Never Smoker   Smokeless tobacco: Never Used  Substance Use Topics   Alcohol use: No    Alcohol/week: 0.0  standard drinks    ALLERGIES:  Allergies  Allergen Reactions   Demerol [Meperidine] Nausea And Vomiting   Macrobid [Nitrofurantoin Macrocrystal] Rash   Penicillins Rash    Has patient had a PCN reaction causing immediate rash, facial/tongue/throat swelling, SOB or lightheadedness with hypotension: Unknown Has patient had a PCN reaction causing severe rash involving mucus membranes or skin necrosis: Unknown Has patient had a PCN reaction that required hospitalization: Unknown Has patient had a PCN reaction occurring within the last 10 years: No If all of the above answers are "NO", then may proceed with Cephalosporin use.      PERTINENT MEDICATIONS:  Outpatient Encounter Medications as of 12/30/2018  Medication Sig   acetaminophen (TYLENOL) 500 MG tablet Take 500 mg by mouth 3 (three) times daily.    alendronate (FOSAMAX) 70 MG tablet TAKE 1 TABLET EVERY 7 DAYS.  SEE PACKAGE FOR ADDITIONAL INSTRUCTIONS (Patient taking differently: Take 70 mg by mouth every Monday. )   amLODipine (NORVASC) 10 MG tablet TAKE 1 TABLET BY MOUTH EVERY DAY (Patient taking differently: Take 10 mg by mouth daily. )   aspirin 81 MG tablet Take 1 tablet (81 mg total) by mouth daily.  atorvastatin (LIPITOR) 40 MG tablet Take 1 tablet (40 mg total) by mouth daily at 6 PM.   Cholecalciferol (VITAMIN D3) 125 MCG (5000 UT) CAPS Take 1 capsule (5,000 Units total) by mouth daily. For 12 weeks, then start Vitamin D3 2,000 units daily (OTC)   clopidogrel (PLAVIX) 75 MG tablet Take 1 tablet (75 mg total) by mouth daily.   Cranberry-Vitamin C (AZO CRANBERRY URINARY TRACT PO) Take 2 tablets by mouth daily.   docusate sodium (COLACE) 100 MG capsule Take 1 capsule (100 mg total) by mouth 2 (two) times daily.   feeding supplement, ENSURE ENLIVE, (ENSURE ENLIVE) LIQD Take 237 mLs by mouth 2 (two) times daily between meals.   FLUoxetine (PROZAC) 40 MG capsule Take 1 capsule (40 mg total) by mouth daily.    ipratropium (ATROVENT) 0.06 % nasal spray Place 2 sprays into both nostrils 4 (four) times daily. For up to 5-7 days then stop. (Patient not taking: Reported on 12/25/2018)   lisinopril (PRINIVIL,ZESTRIL) 40 MG tablet Take 1 tablet (40 mg total) by mouth daily.   LORazepam (ATIVAN) 0.5 MG tablet Take 1 tablet (0.5 mg total) by mouth at bedtime as needed for anxiety or sleep.   Multiple Vitamins-Minerals (MULTIVITAMIN WITH MINERALS) tablet Take 1 tablet by mouth daily.   pantoprazole (PROTONIX) 20 MG tablet Take 20 mg by mouth daily.   polyethylene glycol (MIRALAX / GLYCOLAX) 17 g packet Take 17 g by mouth daily as needed for mild constipation.   potassium chloride (K-DUR,KLOR-CON) 10 MEQ tablet Take 1 tablet (10 mEq total) by mouth daily.   traMADol (ULTRAM) 50 MG tablet Take 1 tablet (50 mg total) by mouth every 6 (six) hours as needed for moderate pain or severe pain. (Patient not taking: Reported on 12/25/2018)   traZODone (DESYREL) 50 MG tablet Take 1 tablet (50 mg total) by mouth at bedtime. May increase to 100mg  if needed after 1-2 weeks   [DISCONTINUED] esomeprazole (NEXIUM) 20 MG capsule TAKE 1 CAPSULE (20 MG TOTAL) BY MOUTH DAILY BEFORE BREAKFAST.   No facility-administered encounter medications on file as of 12/30/2018.     PHYSICAL EXAM/ROS:   Current and past weights: 150 lbs in May 2020, 135 lb in 01/2018 General: NAD, frail, eating well per daughter until yesterday Cardiovascular: no chest pain reported, no edema,  Pulmonary: no cough, no increased SOB, no DOE Abdomen: appetite fair, endorses constipation, h/o impaction after hospitalization  Requiring her daughter to disimpact her,continent of bowel GU: denies dysuria, incontinent of urine MSK: Pivot transfers but is weight bearing 100% with transfers. Skin: no rashes or wounds reported Neurological: Weakness, vascular dementia, may be showing s/sx delirium.   Marijo FileKathryn  M Alisia Vanengen DNP, AGPCNP-BC

## 2018-12-30 NOTE — Telephone Encounter (Signed)
Reviewed chart from Kila NP (today 12/30/18)  She made several medication recommendations that I wanted to follow-up with patient and her caregiver.  Could you call [name] at [phone] [facility])?  To review the following and see if questions:  For Insomnia - She was asked to wean down and discontinue Lorazepam 0.5mg  nightly. She can reduce by HALF pill and take nightly for 3-5 nights, then take HALF pill every other night until finished. Then stop.  - She was asked to increase Trazodone from 50 up to 100mg  nightly. She should have plenty of the 50mg  tablets, to at least start this dose by taking 2 of the 50mg  tabs = 100mg  nightly - Let us know if she is ready for new rx for Trazodone 100mg  tablets (take one whole pill nightly)  Nobie Putnam, DO [facility] 12/30/2018, 6:11 PM

## 2018-12-31 ENCOUNTER — Other Ambulatory Visit: Payer: Medicare HMO | Admitting: Primary Care

## 2018-12-31 ENCOUNTER — Other Ambulatory Visit: Payer: Self-pay

## 2018-12-31 ENCOUNTER — Ambulatory Visit: Payer: Medicare HMO | Admitting: Family Medicine

## 2018-12-31 DIAGNOSIS — Z515 Encounter for palliative care: Secondary | ICD-10-CM | POA: Diagnosis not present

## 2018-12-31 NOTE — Telephone Encounter (Signed)
Thanks.  Her Palliative provider, Estevan Oaks NP recommended  Tylenol Acetaminophen Arthritis, dose 650 mg q 8 hrs, instead of regular Tylenol.  She should continue that recommendation.  Will stay tuned for when she request new rx on the Trazodone 100mg  dose.  Nobie Putnam, [facility] 12/31/2018, 12:51 PM

## 2018-12-31 NOTE — Progress Notes (Signed)
[facility]er, [facility] Palliative Care Consult Note Telephone: [phone]  Fax: [phone]  TELEHEALTH VISIT STATEMENT Due to the COVID-19 crisis, this visit was done via telemedicine from my office. It was initiated and consented to by this patient and/or family.  PATIENT NAME: Jill Hancock DOB: 08-Jul-1940 MRN: [id number]  PRIMARY CARE PROVIDER:   Olin Hauser, DO [phone]  REFERRING PROVIDER:  Olin Hauser, DO [address] [place],  [place] [address] [phone]  RESPONSIBLE PARTY:   Extended Emergency Contact Information Primary Emergency Contact: Jill Hancock [place] Mobile Phone: [phone] Relation: Daughter Secondary Emergency Contact: Jill Hancock Mobile Phone: [phone] Relation: Daughter  Palliative Care was asked to follow this patient by consultation request of Jill Hauser, DO. This is a follow up visit. Daughter Jill Hancock reached out today with concerns over agitation and insomnia.  This visit included over Zoom video patient, caregiver Jill Hancock, Daughter Jill Hancock and myself.   ASSESSMENT AND RECOMMENDATIONS:   1. Goals of Care: Maximize quality of life and symptom management.  2. Symptom Management:   Anxiety:  Had been taking 0.5 mg lorazepam for anxiety bid. We tried to wean but her agitation has worsened at hs on 0.25 mg. Family would prefer to have lorazepam 0.5 mg to have to use for night time agitation and anxiety. Patient is also demonstrating sundowning and getting agitated. We discussed pros and cons of benzodiazepines but in the end the family felt it necessary for quality of life. I would still want to discuss quetiapine as an option. For now, family  would like option of giving lorazepam 0.5 mg q 6 hrs prn for anxiety and agitation from it.   Insomnia: We discussed at length the insomnia problem, Tonight they'd like to give 0.5 mg lorazepam, 50 mg trazodone, and 5 mg melatonin.  They've been giving melatonin at 1 am because she is still awake. I reiterated from yesterday to give the melatonin at hs. If this cocktail is ineffective I have advised them to try 100 of trazodone. Caregiver is wearing thin and this is a significant QOL and caregiving concern.    Delrium: Is a reasonable suspicion based on family report, hospitalizations, pain and underlying dementia. If reports of acute mentation changes persist,  I would recommend changing from trazodone  to seroquel 25 mg at hs and adding am dose if effective.  3. Family /Caregiver/Community Supports: Live in caregiver Jill Hancock.  Daughter is pursuing SNF placement but it will be 30-45 days.  4. Cognitive / Functional decline:  Increased periods of confusion, calm during interview but family reports she can begin swearing and lashing out.  5. Advanced Care Directive: DNR uploaded to [facility].  6. Follow up Palliative Care Visit: Palliative care will continue to follow for goals of care clarification and symptom management. Return next week or prn.  I spent 40 minutes providing this consultation,  from 1730 to 1810. More than 50% of the time in this consultation was spent coordinating communication.   HISTORY OF PRESENT ILLNESS:  Jill Hancock is a 78 y.o. year old female with multiple medical problems including vascular dementia, CVA, depression/anxiety, hypertension, left hip fracture with ORIF, frequent falls, osteoporosis. Palliative Care was asked to help address goals of care.   CODE STATUS: DNR PPS: 30% HOSPICE ELIGIBILITY/DIAGNOSIS: TBD  PAST MEDICAL HISTORY:  Past Medical History:  Diagnosis Date  . Anxiety   . Depression   . Hyperglycemia   . Hypertension   .  Hypokalemia   . Osteoporosis   . Stroke [facility] 02/2012   MRI revealed at least 3 subcentimeter acute infarctions with one area of subacute infarction in widely disparate vascular territories including territory of left cerebellum and around right  caudate nucleus along with widespread lacunar infarcts, chronic microvascular ischemic change, and numerous microbleeds suggesting long standing hypertensive cerebrovascular disease.  MRA confirmed intracranial  . Unsteady gait   . Vertigo     SOCIAL HX:  Social History   Tobacco Use  . Smoking status: Never Smoker  . Smokeless tobacco: Never Used  Substance Use Topics  . Alcohol use: No    Alcohol/week: 0.0 standard drinks    ALLERGIES:  Allergies  Allergen Reactions  . Jill Hancock [Jill Hancock] Nausea And Vomiting  . Jill Hancock [Nitrofurantoin Macrocrystal] Rash  . Jill Hancock Rash    Has patient had a PCN reaction causing immediate rash, facial/tongue/throat swelling, SOB or lightheadedness with hypotension: Unknown Has patient had a PCN reaction causing severe rash involving mucus membranes or skin necrosis: Unknown Has patient had a PCN reaction that required hospitalization: Unknown Has patient had a PCN reaction occurring within the last 10 years: No If all of the above answers are "NO", then may proceed with Cephalosporin use.      PERTINENT MEDICATIONS:  Outpatient Encounter Medications as of 12/31/2018  Medication Sig  . acetaminophen (TYLENOL) 500 MG tablet Take 500 mg by mouth 3 (three) times daily.   Marland Kitchen. alendronate (FOSAMAX) 70 MG tablet TAKE 1 TABLET EVERY 7 DAYS.  SEE PACKAGE FOR ADDITIONAL INSTRUCTIONS (Patient taking differently: Take 70 mg by mouth every Monday. )  . amLODipine (NORVASC) 10 MG tablet TAKE 1 TABLET BY MOUTH EVERY DAY (Patient taking differently: Take 10 mg by mouth daily. )  . aspirin 81 MG tablet Take 1 tablet (81 mg total) by mouth daily.  Marland Kitchen. atorvastatin (LIPITOR) 40 MG tablet Take 1 tablet (40 mg total) by mouth daily at 6 PM.  . Cholecalciferol (VITAMIN D3) 125 MCG (5000 UT) CAPS Take 1 capsule (5,000 Units total) by mouth daily. For 12 weeks, then start Vitamin D3 2,000 units daily (OTC)  . clopidogrel (PLAVIX) 75 MG tablet Take 1 tablet (75 mg  total) by mouth daily.  . Cranberry-Vitamin C (AZO CRANBERRY URINARY TRACT PO) Take 2 tablets by mouth daily.  Marland Kitchen. docusate sodium (COLACE) 100 MG capsule Take 1 capsule (100 mg total) by mouth 2 (two) times daily.  . feeding supplement, ENSURE ENLIVE, (ENSURE ENLIVE) LIQD Take 237 mLs by mouth 2 (two) times daily between meals.  Marland Kitchen. FLUoxetine (PROZAC) 40 MG capsule Take 1 capsule (40 mg total) by mouth daily.  Marland Kitchen. ipratropium (ATROVENT) 0.06 % nasal spray Place 2 sprays into both nostrils 4 (four) times daily. For up to 5-7 days then stop. (Patient not taking: Reported on 12/25/2018)  . lisinopril (PRINIVIL,ZESTRIL) 40 MG tablet Take 1 tablet (40 mg total) by mouth daily.  Marland Kitchen. LORazepam (ATIVAN) 0.5 MG tablet Take 1 tablet (0.5 mg total) by mouth at bedtime as needed for anxiety or sleep.  . Multiple Vitamins-Minerals (MULTIVITAMIN WITH MINERALS) tablet Take 1 tablet by mouth daily.  . pantoprazole (PROTONIX) 20 MG tablet Take 20 mg by mouth daily.  . polyethylene glycol (MIRALAX / GLYCOLAX) 17 g packet Take 17 g by mouth daily as needed for mild constipation.  . potassium chloride (K-DUR,KLOR-CON) 10 MEQ tablet Take 1 tablet (10 mEq total) by mouth daily.  . traMADol (ULTRAM) 50 MG tablet Take 1 tablet (50 mg  total) by mouth every 6 (six) hours as needed for moderate pain or severe pain. (Patient not taking: Reported on 12/25/2018)  . traZODone (DESYREL) 100 MG tablet Take 1 tablet (100 mg total) by mouth at bedtime.  . [DISCONTINUED] esomeprazole (NEXIUM) 20 MG capsule TAKE 1 CAPSULE (20 MG TOTAL) BY MOUTH DAILY BEFORE BREAKFAST.   No facility-administered encounter medications on file as of 12/31/2018.     PHYSICAL EXAM/ROS:   Current and past weights:  Reported 150 but appears slight General: NAD, frail appearing, thin,  no pain while non wt bearing. Cardiovascular: no chest pain reported,  Pulmonary: no cough, no increased SOB, Room air Abdomen: appetite good, denies constipation, incontinent of  bowel GU: denies dysuria, incontinent of urine MSK:  no joint deformities, pivots to chair Skin: no rashes or wounds reported Neurological: Weakness, vascular dementia , sundowning reported  Marijo FileKathryn M Tikesha Mort DNP, AGPCNP-BC

## 2018-12-31 NOTE — Telephone Encounter (Signed)
I called and spoke with Jill Hancock and she verbalize understanding of the medication changes. She also states that [name] recommended d/c the regular tylenol and to start taking tylenol Arthritis. I informed Jill Hancock that I would let [name] know of that recommendation and if he have any concerns I will call her back.

## 2019-01-01 DIAGNOSIS — R531 Weakness: Secondary | ICD-10-CM | POA: Diagnosis not present

## 2019-01-01 DIAGNOSIS — M810 Age-related osteoporosis without current pathological fracture: Secondary | ICD-10-CM | POA: Diagnosis not present

## 2019-01-01 DIAGNOSIS — I10 Essential (primary) hypertension: Secondary | ICD-10-CM | POA: Diagnosis not present

## 2019-01-01 DIAGNOSIS — I69398 Other sequelae of cerebral infarction: Secondary | ICD-10-CM | POA: Diagnosis not present

## 2019-01-01 DIAGNOSIS — S72142D Displaced intertrochanteric fracture of left femur, subsequent encounter for closed fracture with routine healing: Secondary | ICD-10-CM | POA: Diagnosis not present

## 2019-01-01 DIAGNOSIS — F0150 Vascular dementia without behavioral disturbance: Secondary | ICD-10-CM | POA: Diagnosis not present

## 2019-01-01 DIAGNOSIS — M9702XD Periprosthetic fracture around internal prosthetic left hip joint, subsequent encounter: Secondary | ICD-10-CM | POA: Diagnosis not present

## 2019-01-01 DIAGNOSIS — F419 Anxiety disorder, unspecified: Secondary | ICD-10-CM | POA: Diagnosis not present

## 2019-01-01 DIAGNOSIS — S72392D Other fracture of shaft of left femur, subsequent encounter for closed fracture with routine healing: Secondary | ICD-10-CM | POA: Diagnosis not present

## 2019-01-02 ENCOUNTER — Other Ambulatory Visit: Payer: Self-pay | Admitting: Family Medicine

## 2019-01-02 DIAGNOSIS — F419 Anxiety disorder, unspecified: Secondary | ICD-10-CM

## 2019-01-02 DIAGNOSIS — Z515 Encounter for palliative care: Secondary | ICD-10-CM

## 2019-01-02 MED ORDER — LORAZEPAM 0.5 MG PO TABS: 0.5000 mg | ORAL_TABLET | Freq: Four times a day (QID) | ORAL | 0 refills | Status: DC | PRN

## 2019-01-06 ENCOUNTER — Ambulatory Visit: Payer: Self-pay | Admitting: *Deleted

## 2019-01-06 ENCOUNTER — Other Ambulatory Visit: Payer: Self-pay

## 2019-01-06 ENCOUNTER — Ambulatory Visit: Payer: Medicare HMO | Admitting: Family Medicine

## 2019-01-06 ENCOUNTER — Telehealth: Payer: Self-pay | Admitting: Family Medicine

## 2019-01-06 ENCOUNTER — Other Ambulatory Visit: Payer: Medicare HMO | Admitting: Primary Care

## 2019-01-06 ENCOUNTER — Telehealth: Payer: Self-pay

## 2019-01-06 DIAGNOSIS — M9702XD Periprosthetic fracture around internal prosthetic left hip joint, subsequent encounter: Secondary | ICD-10-CM | POA: Diagnosis not present

## 2019-01-06 DIAGNOSIS — M810 Age-related osteoporosis without current pathological fracture: Secondary | ICD-10-CM | POA: Diagnosis not present

## 2019-01-06 DIAGNOSIS — I10 Essential (primary) hypertension: Secondary | ICD-10-CM | POA: Diagnosis not present

## 2019-01-06 DIAGNOSIS — F419 Anxiety disorder, unspecified: Secondary | ICD-10-CM

## 2019-01-06 DIAGNOSIS — R531 Weakness: Secondary | ICD-10-CM | POA: Diagnosis not present

## 2019-01-06 DIAGNOSIS — S72392D Other fracture of shaft of left femur, subsequent encounter for closed fracture with routine healing: Secondary | ICD-10-CM | POA: Diagnosis not present

## 2019-01-06 DIAGNOSIS — Z515 Encounter for palliative care: Secondary | ICD-10-CM

## 2019-01-06 DIAGNOSIS — I69398 Other sequelae of cerebral infarction: Secondary | ICD-10-CM | POA: Diagnosis not present

## 2019-01-06 DIAGNOSIS — F0150 Vascular dementia without behavioral disturbance: Secondary | ICD-10-CM | POA: Diagnosis not present

## 2019-01-06 DIAGNOSIS — S72142D Displaced intertrochanteric fracture of left femur, subsequent encounter for closed fracture with routine healing: Secondary | ICD-10-CM | POA: Diagnosis not present

## 2019-01-06 NOTE — Telephone Encounter (Signed)
SW received request from NP to follow-up with Cecille Rubin (patient's daughter) regarding emergent care needs. SW contacted [employer]. Cecille Rubin described patient's recent decline - physically and cognitively. Cecille Rubin said that she and her sister are trying to support patient in her own senior apartment but that has become increasingly difficult. Cecille Rubin noted that patient is dependent on their care at this time and is unable to be left alone. Cecille Rubin reports caregiver fatigue and feels overwhelmed with deciding how to best care for patient moving forward. SW and Cecille Rubin discussed patient's financial situation, patient is pending LTC Medicaid. Cecille Rubin expressed frustration with the system and concerns for LTC facilities due to COVID-19. SW and Cecille Rubin discussed in-home care options. At [facility] request, SW emailed shared list of local in-home resources. SW provided supportive counseling, used active and reflective listening, provided resource information, and validated [name]'s feelings. Cecille Rubin was appreciative of SW time and information provided. I spent 60 minutes with patient/family, from 1:00-2:00PM providing education, support and consultation.

## 2019-01-06 NOTE — Chronic Care Management (AMB) (Signed)
  Chronic Care Management   Follow Up Note   01/06/2019 Name: TOSHA BELGARDE MRN: [id number] DOB: Aug 14, 1940  Referred by: Olin Hauser, DO Reason for referral : Care Coordination (Palliative Care NP )   BRITTINEY DICOSTANZO is a 78 y.o. year old female who is a primary care patient of Olin Hauser, DO. The CCM team was consulted for assistance with chronic disease management and care coordination needs.    Review of patient status, including review of consultants reports, relevant laboratory and other test results, and collaboration with appropriate care team members and the patient's provider was performed as part of comprehensive patient evaluation and provision of chronic care management services.    Goals Addressed            This Visit's Progress   . We need help getting mom into long term care. We are unable to care for her. (pt-stated)       Current Barriers:  Marland Kitchen Knowledge Deficits related to the process of pursuing LTC for patient from home . Lacks caregiver support.  . Film/video editor.  . Cognitive Deficits . Daughters unable to care for mother with dementia and broken hip with behavior disturbances at home.  Nurse Case Manager Clinical Goal(s):  Marland Kitchen Over the next 30 days RNCM will work with family to have respite in the home and transfer the patient to a LTC facility.   Interventions:  . Incoming call call from [place] NP.  Marland Kitchen She has spoken with the patient's daughter who is still stating the patient's care is causing a significant strain. Curt Bears stated the daughter did mention they may need to take patient back to the ED related to this.  Curt Bears stated the [facility] was also working with the family.  . Made her aware [facility] was also working with the family .  Marland Kitchen CCM Team SW has a scheduled appt with this patient on 7/9.  Marland Kitchen Curt Bears had plans to collaborate with PCP for med  management.   Patient Self Care Activities:  . Currently UNABLE TO independently care for patient at home requesting assistance getting patient to LTC   Please see past updates related to this goal by clicking on the "Past Updates" button in the selected goal          The care management team will reach out to the patient again over the next 7 days.  The patient has been provided with contact information for the care management team and has been advised to call with any health related questions or concerns.   Merlene Morse Selwyn Reason RN, BSN Nurse Case Pharmacist, [facility]  [phone]) Business Mobile

## 2019-01-06 NOTE — Telephone Encounter (Signed)
See other documentation on chart today Briefly, contact with Estevan Oaks NP for [facility] palliative, concerns about patient's behavior and med management, she is recommending Seroquel 50mg  BID, and we are discussing other med options in addition, recently on 7/2 her Lorazepam was increased frequency PRN, and we may consider reducing Trazodone. Awaiting further correspondence.  Nobie Putnam, [facility] 01/06/2019, 6:06 PM

## 2019-01-06 NOTE — Progress Notes (Signed)
Therap[facility], [facility] Consult Note Telephone: [phone]  Fax: [phone]  TELEHEALTH VISIT STATEMENT Due to the COVID-19 crisis, this visit was done via telemedicine from my office. It was initiated and consented to by this patient and/or family.  PATIENT NAME: Jill Hancock DOB: [birth year] MRN: [id number]  PRIMARY CARE PROVIDER:   Smitty CordsKaramalegos, Alexander J, DO [phone]  REFERRING PROVIDER:  Smitty CordsKaramalegos, Alexander J, DO [address] [place],  [place] [id number] [phone]  RESPONSIBLE PARTY:   Extended Emergency Contact Information Primary Emergency Contact: Justine NullBoone,[name]  [place] Mobile Phone: [phone] Relation: Daughter Secondary Emergency Contact: Rodena GoldmannMurphy, [name] Mobile Phone: [phone] Relation: Daughter  Palliative Care was asked to follow this patient by consultation request of Smitty CordsKaramalegos, Alexander J, DO. This is a follow up visit.  ASSESSMENT AND RECOMMENDATIONS:   1. Goals of Care: Maximize quality of life and symptom management, safety and ultimate LTC placement.  2. Symptom Management:   Agitation: Patient has some sundowning and agitated behaviors in the evenings. We talked last week about trazodone which did help for sleeping but the behaviors are beginning around 2 PM. I would recommend looking at Seroquel starting at 25 mg in the afternoon and titrating to a balance between effect and side effect with bid dosing if needed. She's currently taking lorazepam which doesn't always successfully treat dementia -related anxiety. Recommend d/c  trazodone if they changed to Seroquel.   Insomnia: She is currently sleeping 6 hrs/ day with trazodone 100 mg nightly.  Emotional issues: The patient has been estranged from her daughters and son for most of their lives and only re-entered their life two or three years ago. We discussed the difficulty of this relationship and daughters desire to honor her mother but also  set healthy boundaries for what she can do as the caregiver. Daughter states she feels better having gotten to discuss some of their options and she will speak with her sister about possibly starting the Seroquel.  3. Family /Caregiver/Community Supports:  Advance care planning meeting with daughter regarding patient disposition. Caregiving burden is significant as there is not a sustainable caregiving situation. Currently being cared for in her home by a family member but she has given notice.   4. Cognitive / Functional decline: Patient is in decline due to dementia and somatic disease process. She is sundowning and becoming agitated with her caregivers from 2 pm to sleep.   5. Advanced Care Directive: On file, DNR in [facility]. Extensive discussion RE placement from non SNF bed to LTC, daughter also spoke with [facility] SW W [facility]. I f/u with Janci Minor, RN, case manager to present current case status.  6. Follow up Palliative Care Visit: Palliative care will continue to follow for goals of care clarification and symptom management. Return 1-2 weeks or prn.  I spent 80 minutes providing this consultation,  from 1030 to 1150. More than 50% of the time in this consultation was spent coordinating communication.   HISTORY OF PRESENT ILLNESS:  Jill Hancock is a 78 y.o. year old female with multiple medical problems including vascular dementia, CVA, depression/anxiety, hypertension, left hip fracture with ORIF, frequent falls, osteoporosis. Palliative Care was asked to help address goals of care.   CODE STATUS: DNR  PPS: 30% HOSPICE ELIGIBILITY/DIAGNOSIS: TBD  PAST MEDICAL HISTORY:  Past Medical History:  Diagnosis Date  . Anxiety   . Depression   . Hyperglycemia   . Hypertension   . Hypokalemia   .  Osteoporosis   . Stroke [facility](HCC) 02/2012   MRI revealed at least 3 subcentimeter acute infarctions with one area of subacute infarction in widely disparate vascular territories including territory of  left cerebellum and around right caudate nucleus along with widespread lacunar infarcts, chronic microvascular ischemic change, and numerous microbleeds suggesting long standing hypertensive cerebrovascular disease.  MRA confirmed intracranial  . Unsteady gait   . Vertigo     SOCIAL HX:  Social History   Tobacco Use  . Smoking status: Never Smoker  . Smokeless tobacco: Never Used  Substance Use Topics  . Alcohol use: No    Alcohol/week: 0.0 standard drinks    ALLERGIES:  Allergies  Allergen Reactions  . Demerol [Meperidine] Nausea And Vomiting  . Macrobid [Nitrofurantoin Macrocrystal] Rash  . Penicillins Rash    Has patient had a PCN reaction causing immediate rash, facial/tongue/throat swelling, SOB or lightheadedness with hypotension: Unknown Has patient had a PCN reaction causing severe rash involving mucus membranes or skin necrosis: Unknown Has patient had a PCN reaction that required hospitalization: Unknown Has patient had a PCN reaction occurring within the last 10 years: No If all of the above answers are "NO", then may proceed with Cephalosporin use.      PERTINENT MEDICATIONS:  Outpatient Encounter Medications as of 01/06/2019  Medication Sig  . acetaminophen (TYLENOL) 500 MG tablet Take 500 mg by mouth 3 (three) times daily.   Marland Kitchen. alendronate (FOSAMAX) 70 MG tablet TAKE 1 TABLET EVERY 7 DAYS.  SEE PACKAGE FOR ADDITIONAL INSTRUCTIONS (Patient taking differently: Take 70 mg by mouth every Monday. )  . amLODipine (NORVASC) 10 MG tablet TAKE 1 TABLET BY MOUTH EVERY DAY (Patient taking differently: Take 10 mg by mouth daily. )  . aspirin 81 MG tablet Take 1 tablet (81 mg total) by mouth daily.  Marland Kitchen. atorvastatin (LIPITOR) 40 MG tablet Take 1 tablet (40 mg total) by mouth daily at 6 PM.  . Cholecalciferol (VITAMIN D3) 125 MCG (5000 UT) CAPS Take 1 capsule (5,000 Units total) by mouth daily. For 12 weeks, then start Vitamin D3 2,000 units daily (OTC)  . clopidogrel (PLAVIX) 75 MG  tablet Take 1 tablet (75 mg total) by mouth daily.  . Cranberry-Vitamin C (AZO CRANBERRY URINARY TRACT PO) Take 2 tablets by mouth daily.  Marland Kitchen. docusate sodium (COLACE) 100 MG capsule Take 1 capsule (100 mg total) by mouth 2 (two) times daily.  . feeding supplement, ENSURE ENLIVE, (ENSURE ENLIVE) LIQD Take 237 mLs by mouth 2 (two) times daily between meals.  Marland Kitchen. FLUoxetine (PROZAC) 40 MG capsule Take 1 capsule (40 mg total) by mouth daily.  Marland Kitchen. ipratropium (ATROVENT) 0.06 % nasal spray Place 2 sprays into both nostrils 4 (four) times daily. For up to 5-7 days then stop. (Patient not taking: Reported on 12/25/2018)  . lisinopril (PRINIVIL,ZESTRIL) 40 MG tablet Take 1 tablet (40 mg total) by mouth daily.  Marland Kitchen. LORazepam (ATIVAN) 0.5 MG tablet Take 1 tablet (0.5 mg total) by mouth every 6 (six) hours as needed for anxiety or sleep.  . Multiple Vitamins-Minerals (MULTIVITAMIN WITH MINERALS) tablet Take 1 tablet by mouth daily.  . pantoprazole (PROTONIX) 20 MG tablet Take 20 mg by mouth daily.  . polyethylene glycol (MIRALAX / GLYCOLAX) 17 g packet Take 17 g by mouth daily as needed for mild constipation.  . potassium chloride (K-DUR,KLOR-CON) 10 MEQ tablet Take 1 tablet (10 mEq total) by mouth daily.  . traMADol (ULTRAM) 50 MG tablet Take 1 tablet (50 mg total) by  mouth every 6 (six) hours as needed for moderate pain or severe pain. (Patient not taking: Reported on 12/25/2018)  . traZODone (DESYREL) 100 MG tablet Take 1 tablet (100 mg total) by mouth at bedtime.  . [DISCONTINUED] esomeprazole (NEXIUM) 20 MG capsule TAKE 1 CAPSULE (20 MG TOTAL) BY MOUTH DAILY BEFORE BREAKFAST.   No facility-administered encounter medications on file as of 01/06/2019.     PHYSICAL EXAM/ROS:   Current and past weights: 150 lb General: NAD, frail, thin Cardiovascular: no chest pain reported, no edema reported Pulmonary: no cough, no increased SOB, Room air MSK:  no joint deformities, heavy assist to transfer Skin: no rashes or  wounds reported Neurological: Weakness, sleep improved, sundowning reported.  Cyndia Skeeters DNP AGPCNP-BC

## 2019-01-07 ENCOUNTER — Ambulatory Visit: Payer: Self-pay | Admitting: Licensed Clinical Social Worker

## 2019-01-07 DIAGNOSIS — M9702XD Periprosthetic fracture around internal prosthetic left hip joint, subsequent encounter: Secondary | ICD-10-CM | POA: Diagnosis not present

## 2019-01-07 DIAGNOSIS — R531 Weakness: Secondary | ICD-10-CM | POA: Diagnosis not present

## 2019-01-07 DIAGNOSIS — M810 Age-related osteoporosis without current pathological fracture: Secondary | ICD-10-CM | POA: Diagnosis not present

## 2019-01-07 DIAGNOSIS — Z515 Encounter for palliative care: Secondary | ICD-10-CM

## 2019-01-07 DIAGNOSIS — I10 Essential (primary) hypertension: Secondary | ICD-10-CM | POA: Diagnosis not present

## 2019-01-07 DIAGNOSIS — S72142D Displaced intertrochanteric fracture of left femur, subsequent encounter for closed fracture with routine healing: Secondary | ICD-10-CM | POA: Diagnosis not present

## 2019-01-07 DIAGNOSIS — F419 Anxiety disorder, unspecified: Secondary | ICD-10-CM | POA: Diagnosis not present

## 2019-01-07 DIAGNOSIS — I69398 Other sequelae of cerebral infarction: Secondary | ICD-10-CM | POA: Diagnosis not present

## 2019-01-07 DIAGNOSIS — S72392D Other fracture of shaft of left femur, subsequent encounter for closed fracture with routine healing: Secondary | ICD-10-CM | POA: Diagnosis not present

## 2019-01-07 DIAGNOSIS — F0150 Vascular dementia without behavioral disturbance: Secondary | ICD-10-CM | POA: Diagnosis not present

## 2019-01-07 NOTE — Chronic Care Management (AMB) (Signed)
  Chronic Care Management    Clinical Social Work Follow Up Note  01/07/2019 Name: Jill Hancock MRN: [id number] DOB: 07/23/1940  Jill Hancock is a 78 y.o. year old female who is a primary care patient of Jill Hauser, DO. The CCM team was consulted for assistance with Level of Care Concerns.   Review of patient status, including review of consultants reports, other relevant assessments, and collaboration with appropriate care team members and the patient's provider was performed as part of comprehensive patient evaluation and provision of chronic care management services.     Goals Addressed    . My mom needs LTC       Current Barriers:  . Financial constraints . Housing barriers . Level of care concerns . ADL IADL limitations . Limited education about The entire LTC placement process in regards to Medicaid as the coverage source*  Clinical Social Work Clinical Goal(s):  Marland Kitchen Over the next 90 days, client will work with SW to address concerns related to placing patient into a LTC facility that accepts Medicaid  Interventions: . Patient interviewed and appropriate assessments performed . Provided patient with information about Retro process in terms of back payment from Medicaid that family is eligible for once paying out of pocket, upfront for patient to receive LTC placement. This will only take place is patient is successfully approved for Medicaid after 45 days of review of completed application. Patient currently has Medicaid pending.  . Discussed plans with patient for ongoing care management follow up and provided patient with direct contact information for care management team . Advised patient to check email for list of SNF's in the nearby area that accept Medicaid. . Assisted patient/caregiver with obtaining information about health plan benefits . Provided education to patient/caregiver regarding level of care options. . Patient is actively working with [facility] NP and SW Jill Hancock . Patient's care is causing a significant strain as they are unable to properly care for patient in the home. . Patient is also working with Jill Hancock   Patient Self Care Activities:  . Currently UNABLE TO independently take appropriate care of self within current home situation and needs a higher level of care  Initial goal documentation   Follow Up Plan: SW will follow up with patient by phone over the next 14 days  Jill Hancock, [place], MSW, [email or web address] Phone: [phone]

## 2019-01-08 DIAGNOSIS — M9702XD Periprosthetic fracture around internal prosthetic left hip joint, subsequent encounter: Secondary | ICD-10-CM | POA: Diagnosis not present

## 2019-01-08 DIAGNOSIS — S72142D Displaced intertrochanteric fracture of left femur, subsequent encounter for closed fracture with routine healing: Secondary | ICD-10-CM | POA: Diagnosis not present

## 2019-01-08 DIAGNOSIS — S72392D Other fracture of shaft of left femur, subsequent encounter for closed fracture with routine healing: Secondary | ICD-10-CM | POA: Diagnosis not present

## 2019-01-08 DIAGNOSIS — M810 Age-related osteoporosis without current pathological fracture: Secondary | ICD-10-CM | POA: Diagnosis not present

## 2019-01-08 DIAGNOSIS — F0150 Vascular dementia without behavioral disturbance: Secondary | ICD-10-CM | POA: Diagnosis not present

## 2019-01-08 DIAGNOSIS — I69398 Other sequelae of cerebral infarction: Secondary | ICD-10-CM | POA: Diagnosis not present

## 2019-01-08 DIAGNOSIS — R531 Weakness: Secondary | ICD-10-CM | POA: Diagnosis not present

## 2019-01-08 DIAGNOSIS — I10 Essential (primary) hypertension: Secondary | ICD-10-CM | POA: Diagnosis not present

## 2019-01-08 DIAGNOSIS — F419 Anxiety disorder, unspecified: Secondary | ICD-10-CM | POA: Diagnosis not present

## 2019-01-09 ENCOUNTER — Telehealth: Payer: Medicare HMO

## 2019-01-09 DIAGNOSIS — F419 Anxiety disorder, unspecified: Secondary | ICD-10-CM | POA: Diagnosis not present

## 2019-01-09 DIAGNOSIS — F0150 Vascular dementia without behavioral disturbance: Secondary | ICD-10-CM | POA: Diagnosis not present

## 2019-01-09 DIAGNOSIS — M9702XD Periprosthetic fracture around internal prosthetic left hip joint, subsequent encounter: Secondary | ICD-10-CM | POA: Diagnosis not present

## 2019-01-09 DIAGNOSIS — I10 Essential (primary) hypertension: Secondary | ICD-10-CM | POA: Diagnosis not present

## 2019-01-09 DIAGNOSIS — R531 Weakness: Secondary | ICD-10-CM | POA: Diagnosis not present

## 2019-01-09 DIAGNOSIS — S72142D Displaced intertrochanteric fracture of left femur, subsequent encounter for closed fracture with routine healing: Secondary | ICD-10-CM | POA: Diagnosis not present

## 2019-01-09 DIAGNOSIS — M810 Age-related osteoporosis without current pathological fracture: Secondary | ICD-10-CM | POA: Diagnosis not present

## 2019-01-09 DIAGNOSIS — S72392D Other fracture of shaft of left femur, subsequent encounter for closed fracture with routine healing: Secondary | ICD-10-CM | POA: Diagnosis not present

## 2019-01-09 DIAGNOSIS — I69398 Other sequelae of cerebral infarction: Secondary | ICD-10-CM | POA: Diagnosis not present

## 2019-01-10 ENCOUNTER — Telehealth: Payer: Self-pay | Admitting: Family Medicine

## 2019-01-10 NOTE — Telephone Encounter (Signed)
Pt needs refill sent out to [facility] [place], pt is sleeping good per daughter.   traZODone (DESYREL) 100 MG tablet [[id number]]

## 2019-01-10 NOTE — Telephone Encounter (Signed)
The pt daughter was notified that the patient prescription was sent over to her pharmacy.

## 2019-01-11 ENCOUNTER — Inpatient Hospital Stay
Admission: EM | Admit: 2019-01-11 | Discharge: 2019-01-16 | DRG: 690 | Disposition: A | Discharge status: Skilled Nursing Facility | Payer: Medicare HMO | Attending: Internal Medicine | Admitting: Internal Medicine

## 2019-01-11 ENCOUNTER — Emergency Department: Payer: Medicare HMO

## 2019-01-11 ENCOUNTER — Other Ambulatory Visit: Payer: Self-pay

## 2019-01-11 DIAGNOSIS — I10 Essential (primary) hypertension: Secondary | ICD-10-CM | POA: Diagnosis not present

## 2019-01-11 DIAGNOSIS — Z9049 Acquired absence of other specified parts of digestive tract: Secondary | ICD-10-CM

## 2019-01-11 DIAGNOSIS — F329 Major depressive disorder, single episode, unspecified: Secondary | ICD-10-CM | POA: Diagnosis not present

## 2019-01-11 DIAGNOSIS — Z1159 Encounter for screening for other viral diseases: Secondary | ICD-10-CM | POA: Diagnosis not present

## 2019-01-11 DIAGNOSIS — B9620 Unspecified Escherichia coli [E. coli] as the cause of diseases classified elsewhere: Secondary | ICD-10-CM | POA: Diagnosis present

## 2019-01-11 DIAGNOSIS — Z7982 Long term (current) use of aspirin: Secondary | ICD-10-CM

## 2019-01-11 DIAGNOSIS — R29818 Other symptoms and signs involving the nervous system: Secondary | ICD-10-CM | POA: Diagnosis not present

## 2019-01-11 DIAGNOSIS — R4701 Aphasia: Secondary | ICD-10-CM | POA: Diagnosis not present

## 2019-01-11 DIAGNOSIS — F0150 Vascular dementia without behavioral disturbance: Secondary | ICD-10-CM | POA: Diagnosis not present

## 2019-01-11 DIAGNOSIS — Z79899 Other long term (current) drug therapy: Secondary | ICD-10-CM

## 2019-01-11 DIAGNOSIS — Z7401 Bed confinement status: Secondary | ICD-10-CM | POA: Diagnosis not present

## 2019-01-11 DIAGNOSIS — S72002A Fracture of unspecified part of neck of left femur, initial encounter for closed fracture: Secondary | ICD-10-CM | POA: Diagnosis not present

## 2019-01-11 DIAGNOSIS — N390 Urinary tract infection, site not specified: Principal | ICD-10-CM | POA: Diagnosis present

## 2019-01-11 DIAGNOSIS — F411 Generalized anxiety disorder: Secondary | ICD-10-CM | POA: Diagnosis not present

## 2019-01-11 DIAGNOSIS — I21A9 Other myocardial infarction type: Secondary | ICD-10-CM | POA: Diagnosis not present

## 2019-01-11 DIAGNOSIS — I69320 Aphasia following cerebral infarction: Secondary | ICD-10-CM

## 2019-01-11 DIAGNOSIS — M2550 Pain in unspecified joint: Secondary | ICD-10-CM | POA: Diagnosis not present

## 2019-01-11 DIAGNOSIS — G464 Cerebellar stroke syndrome: Secondary | ICD-10-CM | POA: Diagnosis not present

## 2019-01-11 DIAGNOSIS — F419 Anxiety disorder, unspecified: Secondary | ICD-10-CM | POA: Diagnosis present

## 2019-01-11 DIAGNOSIS — G2581 Restless legs syndrome: Secondary | ICD-10-CM | POA: Diagnosis present

## 2019-01-11 DIAGNOSIS — R410 Disorientation, unspecified: Secondary | ICD-10-CM | POA: Diagnosis not present

## 2019-01-11 DIAGNOSIS — I6389 Other cerebral infarction: Secondary | ICD-10-CM | POA: Diagnosis not present

## 2019-01-11 DIAGNOSIS — R29703 NIHSS score 3: Secondary | ICD-10-CM | POA: Diagnosis present

## 2019-01-11 DIAGNOSIS — K219 Gastro-esophageal reflux disease without esophagitis: Secondary | ICD-10-CM | POA: Diagnosis present

## 2019-01-11 DIAGNOSIS — Z7902 Long term (current) use of antithrombotics/antiplatelets: Secondary | ICD-10-CM

## 2019-01-11 DIAGNOSIS — R531 Weakness: Secondary | ICD-10-CM | POA: Diagnosis not present

## 2019-01-11 DIAGNOSIS — M810 Age-related osteoporosis without current pathological fracture: Secondary | ICD-10-CM | POA: Diagnosis present

## 2019-01-11 DIAGNOSIS — I639 Cerebral infarction, unspecified: Secondary | ICD-10-CM

## 2019-01-11 DIAGNOSIS — E785 Hyperlipidemia, unspecified: Secondary | ICD-10-CM | POA: Diagnosis present

## 2019-01-11 DIAGNOSIS — Z66 Do not resuscitate: Secondary | ICD-10-CM | POA: Diagnosis present

## 2019-01-11 DIAGNOSIS — Z03818 Encounter for observation for suspected exposure to other biological agents ruled out: Secondary | ICD-10-CM | POA: Diagnosis not present

## 2019-01-11 DIAGNOSIS — I6523 Occlusion and stenosis of bilateral carotid arteries: Secondary | ICD-10-CM | POA: Diagnosis not present

## 2019-01-11 DIAGNOSIS — Z885 Allergy status to narcotic agent status: Secondary | ICD-10-CM

## 2019-01-11 DIAGNOSIS — M6281 Muscle weakness (generalized): Secondary | ICD-10-CM | POA: Diagnosis not present

## 2019-01-11 DIAGNOSIS — I6782 Cerebral ischemia: Secondary | ICD-10-CM | POA: Diagnosis not present

## 2019-01-11 DIAGNOSIS — Z883 Allergy status to other anti-infective agents status: Secondary | ICD-10-CM | POA: Diagnosis not present

## 2019-01-11 DIAGNOSIS — R404 Transient alteration of awareness: Secondary | ICD-10-CM | POA: Diagnosis not present

## 2019-01-11 DIAGNOSIS — R4781 Slurred speech: Secondary | ICD-10-CM | POA: Diagnosis not present

## 2019-01-11 DIAGNOSIS — Z880 Allergy status to penicillin: Secondary | ICD-10-CM

## 2019-01-11 DIAGNOSIS — Z79891 Long term (current) use of opiate analgesic: Secondary | ICD-10-CM

## 2019-01-11 DIAGNOSIS — Z8249 Family history of ischemic heart disease and other diseases of the circulatory system: Secondary | ICD-10-CM

## 2019-01-11 DIAGNOSIS — R2681 Unsteadiness on feet: Secondary | ICD-10-CM | POA: Diagnosis not present

## 2019-01-11 DIAGNOSIS — I2510 Atherosclerotic heart disease of native coronary artery without angina pectoris: Secondary | ICD-10-CM | POA: Diagnosis not present

## 2019-01-11 DIAGNOSIS — Z90710 Acquired absence of both cervix and uterus: Secondary | ICD-10-CM | POA: Diagnosis not present

## 2019-01-11 DIAGNOSIS — M25551 Pain in right hip: Secondary | ICD-10-CM | POA: Diagnosis present

## 2019-01-11 DIAGNOSIS — Z7983 Long term (current) use of bisphosphonates: Secondary | ICD-10-CM | POA: Diagnosis not present

## 2019-01-11 DIAGNOSIS — Z823 Family history of stroke: Secondary | ICD-10-CM | POA: Diagnosis not present

## 2019-01-11 DIAGNOSIS — Z515 Encounter for palliative care: Secondary | ICD-10-CM

## 2019-01-11 LAB — ETHANOL: Alcohol, Ethyl (B): <10 mg/dL (ref <10)

## 2019-01-11 LAB — COMPREHENSIVE METABOLIC PANEL
ALT: 17 U/L (ref 0–44)
AST: 23 U/L (ref 15–41)
Albumin: 3.3 g/dL — ABNORMAL LOW (ref 3.5–5.0)
Alkaline Phosphatase: 83 U/L (ref 38–126)
Anion gap: 10 (ref 5–15)
BUN: 10 mg/dL (ref 8–23)
CO2: 26 mmol/L (ref 22–32)
Calcium: 9.1 mg/dL (ref 8.9–10.3)
Chloride: 103 mmol/L (ref 98–111)
Creatinine, Ser: 0.69 mg/dL (ref 0.44–1.00)
GFR calc Af Amer: >60 mL/min (ref >60)
GFR calc non Af Amer: >60 mL/min (ref >60)
Glucose, Bld: 123 mg/dL — ABNORMAL HIGH (ref 70–99)
Potassium: 3.4 mmol/L — ABNORMAL LOW (ref 3.5–5.1)
Sodium: 139 mmol/L (ref 135–145)
Total Bilirubin: 0.2 mg/dL — ABNORMAL LOW (ref 0.3–1.2)
Total Protein: 6.8 g/dL (ref 6.5–8.1)

## 2019-01-11 LAB — URINE DRUG SCREEN, QUALITATIVE (ARMC ONLY)
Amphetamines, Ur Screen: NOT DETECTED
Barbiturates, Ur Screen: NOT DETECTED
Benzodiazepine, Ur Scrn: NOT DETECTED
Cannabinoid 50 Ng, Ur ~~LOC~~: NOT DETECTED
Cocaine Metabolite,Ur ~~LOC~~: NOT DETECTED
MDMA (Ecstasy)Ur Screen: NOT DETECTED
Methadone Scn, Ur: NOT DETECTED
Opiate, Ur Screen: NOT DETECTED
Phencyclidine (PCP) Ur S: NOT DETECTED
Tricyclic, Ur Screen: NOT DETECTED

## 2019-01-11 LAB — CBC
HCT: 35.8 % — ABNORMAL LOW (ref 36.0–46.0)
Hemoglobin: 11.6 g/dL — ABNORMAL LOW (ref 12.0–15.0)
MCH: 28.1 pg (ref 26.0–34.0)
MCHC: 32.4 g/dL (ref 30.0–36.0)
MCV: 86.7 fL (ref 80.0–100.0)
Platelets: 370 10*3/uL (ref 150–400)
RBC: 4.13 MIL/uL (ref 3.87–5.11)
RDW: 14.2 % (ref 11.5–15.5)
WBC: 9.9 10*3/uL (ref 4.0–10.5)
nRBC: 0 % (ref 0.0–0.2)

## 2019-01-11 LAB — URINALYSIS, ROUTINE W REFLEX MICROSCOPIC
Bilirubin Urine: NEGATIVE
Glucose, UA: NEGATIVE mg/dL
Hgb urine dipstick: NEGATIVE
Ketones, ur: NEGATIVE mg/dL
Nitrite: POSITIVE — AB
Protein, ur: NEGATIVE mg/dL
Specific Gravity, Urine: 1.013 (ref 1.005–1.030)
pH: 6 (ref 5.0–8.0)

## 2019-01-11 LAB — DIFFERENTIAL
Abs Immature Granulocytes: 0.04 10*3/uL (ref 0.00–0.07)
Basophils Absolute: 0.1 10*3/uL (ref 0.0–0.1)
Basophils Relative: 1 %
Eosinophils Absolute: 0.3 10*3/uL (ref 0.0–0.5)
Eosinophils Relative: 3 %
Immature Granulocytes: 0 %
Lymphocytes Relative: 34 %
Lymphs Abs: 3.3 10*3/uL (ref 0.7–4.0)
Monocytes Absolute: 0.7 10*3/uL (ref 0.1–1.0)
Monocytes Relative: 8 %
Neutro Abs: 5.5 10*3/uL (ref 1.7–7.7)
Neutrophils Relative %: 54 %

## 2019-01-11 LAB — PROTIME-INR
INR: 1 (ref 0.8–1.2)
Prothrombin Time: 12.6 seconds (ref 11.4–15.2)

## 2019-01-11 LAB — SARS CORONAVIRUS 2 BY RT PCR (HOSPITAL ORDER, PERFORMED IN ~~LOC~~ HOSPITAL LAB): SARS Coronavirus 2: NEGATIVE

## 2019-01-11 LAB — APTT: aPTT: 26 seconds (ref 24–36)

## 2019-01-11 NOTE — ED Notes (Signed)
Report from jessica, rn.  

## 2019-01-11 NOTE — Progress Notes (Signed)
PHARMACIST CODE STROKE RESPONSE  Contacted Nurse Janett Billow in regards to Code Stroke message received@[phone]  and if anything was needed. Nurse stated that LKW was 1330 today and patient was outside of the window of TPA administration. Also stated that MD will possibly contact IR for intervention.  Pharmacy will continue to monitor for any necessary assistance.  Issues/delays encountered (if applicable): N/A  Pearla Dubonnet, PharmD Clinical Pharmacist 01/11/2019 9:46 PM

## 2019-01-11 NOTE — Consult Note (Signed)
[facility] TeleNeurology Consult Services   Date of Service:   01/11/2019 21:42:14  Impression:     .  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 R/O stroke  Metrics: Last Known Well: 01/11/2019 17:00:00 [facility] Notification Time: 01/11/2019 21:42:14 Arrival Time: 01/11/2019 21:01:00 Stamp Time: 01/11/2019 21:42:14 Time First Login Attempt: 01/11/2019 21:49:46 Video Start Time: 01/11/2019 21:50:30  Symptoms: aphasia NIHSS Start Assessment Time: 01/11/2019 21:52:30 Patient is not a candidate for tPA. Patient was not deemed candidate for tPA thrombolytics because of Last Well Known Above 4.5 Hours. Video End Time: 01/11/2019 22:01:08  CT head was reviewed and results were: 1. 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 2. ASPECTS is 10.  Clinical Presentation is not Suggestive of Large Vessel Occlusive Disease  ED Physician notified of diagnostic impression and management plan on 01/11/2019 22:01:09  Our recommendations are outlined below.  Recommendations:     .  Activate Stroke Protocol Admission/Order Set     .  Stroke/Telemetry Floor     .  Neuro Checks     .  Bedside Swallow Eval     .  DVT Prophylaxis     .  IV Fluids, Normal Saline     .  Head of Bed 30 Degrees     .  Euglycemia and Avoid Hyperthermia (PRN Acetaminophen)     .  continue asa and plavix.  Routine Consultation with Inhouse Neurology for Follow up Care  Sign Out:     .  Discussed with Emergency Department Provider    ------------------------------------------------------------------------------  History of Present Illness: Patient is a 78 year old Female.  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 She denies any deficits and states that she is in hospital to get her vitals checked. Does not seem to have any focal weakness.    Examination: 1A: Level of Consciousness - Alert; keenly responsive + 0 1B: Ask Month and Age - Both Questions Right + 0 1C: Blink Eyes & Squeeze Hands - Performs Both Tasks + 0 2: Test Horizontal Extraocular Movements - Normal + 0 3: Test Visual Fields - No Visual Loss + 0 4: Test Facial Palsy (Use Grimace if Obtunded) - Normal symmetry + 0 5A: Test Left Arm Motor Drift - No Drift for 10 Seconds + 0 5B: Test Right Arm Motor Drift - No Drift for 10 Seconds + 0 6A: Test Left Leg Motor Drift - Drift, but doesn't hit bed + 1 6B: Test Right Leg Motor Drift - No Drift for 5 Seconds + 0 7: Test Limb Ataxia (FNF/Heel-Shin) - No Ataxia + 0 8: Test Sensation - Normal; No sensory loss + 0 9: Test Language/Aphasia - Mild-Moderate Aphasia: Some Obvious Changes, Without Significant Limitation + 1 10: Test Dysarthria - Mild-Moderate Dysarthria: Slurring but can be understood + 1 11: Test Extinction/Inattention - No abnormality + 0  NIHSS Score: 3  Patient/Family was informed the Neurology Consult would happen via TeleHealth consult by way of interactive audio and video telecommunications and consented to receiving care in this manner.  Due to the immediate potential for life-threatening deterioration due to underlying acute neurologic illness, I spent 35 minutes providing critical care. This time includes time for face to  face visit via telemedicine, review of medical records, imaging studies and discussion of findings with providers, the patient and/or family.   Dr Beau Fanny   [facility] [phone]  Case [id number]

## 2019-01-11 NOTE — ED Notes (Signed)
Tele neuro decision to admit for stroke.

## 2019-01-11 NOTE — ED Notes (Signed)
Pt assisted with bedpan. Pt yelling out approx every 5 minutes wanting update on delay to floor room. Pt updated multiple times.

## 2019-01-11 NOTE — ED Provider Notes (Signed)
[facility] Emergency Department Provider Note  Time seen: 9:57 PM  I have reviewed the triage vital signs and the nursing notes.   HISTORY  Chief Complaint Aphasia  HPI Jill Hancock is a 78 y.o. female with a past medical history of anxiety, depression, hypertension, hyperglycemia, presents to the emergency department for difficulty speaking.  I spoke to the daughter over the phone, patient has a history of stroke in the past, however she states at baseline she speaks clearly and on a rare occasion will have a slight stutter.  Denied around 5 PM the patient began having significant slurred speech to the point where it was nearly unintelligible.  No obvious weakness or numbness.  Patient is awake and alert however disoriented to time.  Daughter states the patient was acting more agitated throughout the day today, which is also abnormal.  Past Medical History:  Diagnosis Date  . Anxiety   . Depression   . Hyperglycemia   . Hypertension   . Hypokalemia   . Osteoporosis   . Stroke [facility]) 02/2012   MRI revealed at least 3 subcentimeter acute infarctions with one area of subacute infarction in widely disparate vascular territories including territory of left cerebellum and around right caudate nucleus along with widespread lacunar infarcts, chronic microvascular ischemic change, and numerous microbleeds suggesting long standing hypertensive cerebrovascular disease.  MRA confirmed intracranial  . Unsteady gait   . Vertigo     Patient Active Problem List   Diagnosis Date Noted  . Palliative care patient 01/02/2019  . CVA (cerebral vascular accident) ([place]) 12/17/2018  . Closed left hip fracture ([place]) 11/21/2018  . Pre-diabetes 07/25/2018  . Multiple rib fractures 12/31/2017  . Primary osteoarthritis of right knee 12/14/2017  . Dementia, vascular ([place]) 12/04/2017  . Right shoulder injury 11/12/2015  . Urinary urgency 09/17/2015  . Hyperglycemia 07/29/2015  .  Restless leg 06/07/2015  . Benign paroxysmal positional vertigo 06/07/2015  . Hyperlipidemia 04/16/2015  . Vitamin D deficiency 08/13/2014  . T12 compression fracture ([place]) 04/28/2014  . 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 Major depression, recurrent, chronic ([place]) 11/24/2012  . GERD (gastroesophageal reflux disease) 11/24/2012  . Osteoporosis 09/05/2012  . Right hip pain 08/23/2012  . Anxiety 05/04/2012  . Hypertension 02/27/2012  . History of cerebrovascular accident (CVA) with residual deficit 02/25/2012  . Unsteady gait 02/25/2012    Past Surgical History:  Procedure Laterality Date  . BREAST BIOPSY Right    pt not sure when   . CHOLECYSTECTOMY    . INTRAMEDULLARY (IM) NAIL INTERTROCHANTERIC Left 11/21/2018   Procedure: INTRAMEDULLARY (IM) NAIL INTERTROCHANTRIC;  Surgeon: Thornton Park, MD;  Location: [facility] ORS;  Service: Orthopedics;  Laterality: Left;  Marland Kitchen VAGINAL HYSTERECTOMY      Prior to Admission medications   Medication Sig Start Date End Date Taking? Authorizing Provider  acetaminophen (TYLENOL) 500 MG tablet Take 500 mg by mouth 3 (three) times daily.     [provider]  alendronate (FOSAMAX) 70 MG tablet TAKE 1 TABLET EVERY 7 DAYS.  SEE PACKAGE FOR ADDITIONAL INSTRUCTIONS Patient taking differently: Take 70 mg by mouth every Monday.  02/22/18   Karamalegos, Devonne Doughty, DO  amLODipine (NORVASC) 10 MG tablet TAKE 1 TABLET BY MOUTH EVERY DAY Patient taking differently: Take 10 mg by mouth daily.  08/26/18   Karamalegos, Devonne Doughty, DO  aspirin 81 MG tablet Take 1 tablet (81 mg total) by mouth daily. 03/07/13   Philmore Pali, NP  atorvastatin (LIPITOR) 40 MG  tablet Take 1 tablet (40 mg total) by mouth daily at 6 PM. 12/20/18   Enid BaasKalisetti, Radhika, MD  Cholecalciferol (VITAMIN D3) 125 MCG (5000 UT) CAPS Take 1 capsule (5,000 Units total) by mouth daily. For 12 weeks, then start Vitamin D3 2,000 units daily (OTC) 07/29/18   Althea CharonKaramalegos, Netta NeatAlexander J, DO  clopidogrel  (PLAVIX) 75 MG tablet Take 1 tablet (75 mg total) by mouth daily. 12/21/18   Enid BaasKalisetti, Radhika, MD  Cranberry-Vitamin C (AZO CRANBERRY URINARY TRACT PO) Take 2 tablets by mouth daily. 03/20/18   [provider]  docusate sodium (COLACE) 100 MG capsule Take 1 capsule (100 mg total) by mouth 2 (two) times daily. 11/26/18   Altamese DillingVachhani, Vaibhavkumar, MD  feeding supplement, ENSURE ENLIVE, (ENSURE ENLIVE) LIQD Take 237 mLs by mouth 2 (two) times daily between meals. 12/20/18   Enid BaasKalisetti, Radhika, MD  FLUoxetine (PROZAC) 40 MG capsule Take 1 capsule (40 mg total) by mouth daily. 05/28/18   Karamalegos, Netta NeatAlexander J, DO  ipratropium (ATROVENT) 0.06 % nasal spray Place 2 sprays into both nostrils 4 (four) times daily. For up to 5-7 days then stop. Patient not taking: Reported on 12/25/2018 04/10/18   Smitty CordsKaramalegos, Alexander J, DO  lisinopril (PRINIVIL,ZESTRIL) 40 MG tablet Take 1 tablet (40 mg total) by mouth daily. 02/22/18   Karamalegos, Netta NeatAlexander J, DO  LORazepam (ATIVAN) 0.5 MG tablet Take 1 tablet (0.5 mg total) by mouth every 6 (six) hours as needed for anxiety or sleep. 01/02/19   Karamalegos, Netta NeatAlexander J, DO  Multiple Vitamins-Minerals (MULTIVITAMIN WITH MINERALS) tablet Take 1 tablet by mouth daily.    [provider]  pantoprazole (PROTONIX) 20 MG tablet Take 20 mg by mouth daily.    [provider]  polyethylene glycol (MIRALAX / GLYCOLAX) 17 g packet Take 17 g by mouth daily as needed for mild constipation. 11/26/18   Altamese DillingVachhani, Vaibhavkumar, MD  potassium chloride (K-DUR,KLOR-CON) 10 MEQ tablet Take 1 tablet (10 mEq total) by mouth daily. 05/28/18   Karamalegos, Netta NeatAlexander J, DO  traMADol (ULTRAM) 50 MG tablet Take 1 tablet (50 mg total) by mouth every 6 (six) hours as needed for moderate pain or severe pain. Patient not taking: Reported on 12/25/2018 12/20/18   Enid BaasKalisetti, Radhika, MD  traZODone (DESYREL) 100 MG tablet Take 1 tablet (100 mg total) by mouth at bedtime. 12/31/18    Karamalegos, Netta NeatAlexander J, DO  esomeprazole (NEXIUM) 20 MG capsule TAKE 1 CAPSULE (20 MG TOTAL) BY MOUTH DAILY BEFORE BREAKFAST. 12/18/18 12/23/18  Smitty CordsKaramalegos, Alexander J, DO    Allergies  Allergen Reactions  . Demerol [Meperidine] Nausea And Vomiting  . Macrobid [Nitrofurantoin Macrocrystal] Rash  . Penicillins Rash    Has patient had a PCN reaction causing immediate rash, facial/tongue/throat swelling, SOB or lightheadedness with hypotension: Unknown Has patient had a PCN reaction causing severe rash involving mucus membranes or skin necrosis: Unknown Has patient had a PCN reaction that required hospitalization: Unknown Has patient had a PCN reaction occurring within the last 10 years: No If all of the above answers are "NO", then may proceed with Cephalosporin use.     Family History  Problem Relation Age of Onset  . Heart attack Mother   . Heart disease Mother   . Heart disease Father   . Heart attack Brother        death at age 79  . Stroke Brother        death at 7261  . Bladder Cancer Neg Hx   . Kidney cancer Neg  Hx     Social History Social History   Tobacco Use  . Smoking status: Never Smoker  . Smokeless tobacco: Never Used  Substance Use Topics  . Alcohol use: No    Alcohol/week: 0.0 standard drinks  . Drug use: No    Review of Systems Constitutional: Negative for fever. Cardiovascular: Negative for chest pain. Respiratory: Negative for shortness of breath. Gastrointestinal: Negative for abdominal pain Musculoskeletal: Negative for musculoskeletal complaints Skin: Negative for skin complaints  Neurological: Slurred speech since 5 PM. All other ROS negative  ____________________________________________   PHYSICAL EXAM:  VITAL SIGNS: ED Triage Vitals [01/11/19 2110]  Enc Vitals Group     BP (!) 145/75     Pulse Rate 94     Resp 16     Temp 98.1 F (36.7 C)     Temp Source Oral     SpO2 98 %     Weight 140 lb (63.5 kg)     Height 5\' 8"  (1.727  m)     Head Circumference      Peak Flow      Pain Score 0     Pain Loc      Pain Edu?      Excl. in GC?     Constitutional: Alert and oriented. Well appearing and in no distress. Eyes: Normal exam ENT      Head: Normocephalic and atraumatic      Mouth/Throat: Mucous membranes are moist. Cardiovascular: Normal rate, regular rhythm. No murmur Respiratory: Normal respiratory effort without tachypnea nor retractions. Breath sounds are clear Gastrointestinal: Soft and nontender. No distention.  Musculoskeletal: Nontender with normal range of motion in all extremities.  Neurologic: Patient has slurred speech.  Equal grip strengths.  No pronator drift.  No lower extremity weakness Skin:  Skin is warm, dry and intact.  Psychiatric: Mood and affect are normal ____________________________________________    EKG  EKG viewed and interpreted by myself shows a normal sinus rhythm at 90 bpm with a narrow QRS, left axis deviation, largely normal intervals with nonspecific ST changes.  ____________________________________________    RADIOLOGY  CT scan of the head is negative  ____________________________________________   INITIAL IMPRESSION / ASSESSMENT AND PLAN / ED COURSE  Pertinent labs & imaging results that were available during my care of the patient were reviewed by me and considered in my medical decision making (see chart for details).   Patient presents emergency department for acute onset of slurred speech at 5 PM tonight.  Differential would include CVA, medication reaction, metabolic abnormality, infectious etiology.  Overall patient appears well but deafly has slurred speech I spoke to the patient's daughter on the phone this appears to be new for the patient since 5 PM tonight.  Daughter is Rodena GoldmannRobin Murphy phone number [phone].  Code stroke was called.  Patient's labs are largely within normal limits.  CT scan of the head is negative.  Neurology has seen and recommends  inpatient admission for further work-up.  NIH Stroke Scale   Interval: Baseline Time: 11:01 PM Person Administering Scale: Minna AntisKevin Solana Coggin  Administer stroke scale items in the order listed. Record performance in each category after each subscale exam. Do not go back and change scores. Follow directions provided for each exam technique. Scores should reflect what the patient does, not what the clinician thinks the patient can do. The clinician should record answers while administering the exam and work quickly. Except where indicated, the patient should not be coached (i.e., repeated requests  to patient to make a special effort).   1a  Level of consciousness: 0=alert; keenly responsive  1b. LOC questions:  0=Performs both tasks correctly  1c. LOC commands: 0=Performs both tasks correctly  2.  Best Gaze: 0=normal  3.  Visual: 0=No visual loss  4. Facial Palsy: 0=Normal symmetric movement  5a.  Motor left arm: 0  5b.  Motor right arm: 0=No drift, limb holds 90 (or 45) degrees for full 10 seconds  6a. motor left leg: 0=No drift, limb holds 90 (or 45) degrees for full 10 seconds  6b  Motor right leg:  0=No drift, limb holds 90 (or 45) degrees for full 10 seconds  7. Limb Ataxia: 0=Absent  8.  Sensory: 0=Normal; no sensory loss  9. Best Language:  1=Mild to moderate aphasia; some obvious loss of fluency or facility of comprehension without significant limitation on ideas expressed or form of expression.  10. Dysarthria: 1=Mild to moderate, patient slurs at least some words and at worst, can be understood with some difficulty  11. Extinction and Inattention: 0=No abnormality  12. Distal motor function: 0=Normal   Total:   2    Jill Hancock was evaluated in Emergency Department on 01/11/2019 for the symptoms described in the history of present illness. She was evaluated in the context of the global COVID-19 pandemic, which necessitated consideration that the patient might be at risk for  infection with the SARS-CoV-2 virus that causes COVID-19. Institutional protocols and algorithms that pertain to the evaluation of patients at risk for COVID-19 are in a state of rapid change based on information released by regulatory bodies including the CDC and federal and state organizations. These policies and algorithms were followed during the patient's care in the ED.  ____________________________________________   FINAL CLINICAL IMPRESSION(S) / ED DIAGNOSES  CVA   Minna AntisPaduchowski, Stephene Alegria, MD 01/11/19 2308

## 2019-01-11 NOTE — ED Triage Notes (Signed)
Pt comes EMS from home with stroke like symptoms. Pt having sever expressive aphasia at this time and is disoriented to situation and time. LKW 130pm

## 2019-01-11 NOTE — ED Notes (Signed)
hospitalist in to see pt.

## 2019-01-11 NOTE — H&P (Signed)
[facility] NAME: Jill Hancock    MR#:  [id number]  DATE OF BIRTH:  [birth year]  DATE OF ADMISSION:  01/11/2019  PRIMARY CARE PHYSICIAN: Olin Hauser, DO   REQUESTING/REFERRING PHYSICIAN: Kerman Passey, MD  CHIEF COMPLAINT:   Chief Complaint  Patient presents with  . Aphasia    HISTORY OF PRESENT ILLNESS:  Jill Hancock  is a 78 y.o. female who presents with chief complaint as above.  Patient brought to the ED for worsening speech problems.  She has prior history of stroke, and states that she has had slurred speech and speech difficulty for the past year.  However, family states that her speech problems today are much worse than her baseline.  Initial evaluation here in the ED is largely within normal limits in terms of stroke work-up.  Hospitalist were called for admission  PAST MEDICAL HISTORY:   Past Medical History:  Diagnosis Date  . Anxiety   . Depression   . Hyperglycemia   . Hypertension   . Hypokalemia   . Osteoporosis   . Stroke [facility]) 02/2012   MRI revealed at least 3 subcentimeter acute infarctions with one area of subacute infarction in widely disparate vascular territories including territory of left cerebellum and around right caudate nucleus along with widespread lacunar infarcts, chronic microvascular ischemic change, and numerous microbleeds suggesting long standing hypertensive cerebrovascular disease.  MRA confirmed intracranial  . Unsteady gait   . Vertigo      PAST SURGICAL HISTORY:   Past Surgical History:  Procedure Laterality Date  . BREAST BIOPSY Right    pt not sure when   . CHOLECYSTECTOMY    . INTRAMEDULLARY (IM) NAIL INTERTROCHANTERIC Left 11/21/2018   Procedure: INTRAMEDULLARY (IM) NAIL INTERTROCHANTRIC;  Surgeon: Thornton Park, MD;  Location: [facility] ORS;  Service: Orthopedics;  Laterality: Left;  Marland Kitchen VAGINAL HYSTERECTOMY       SOCIAL HISTORY:   Social History    Tobacco Use  . Smoking status: Never Smoker  . Smokeless tobacco: Never Used  Substance Use Topics  . Alcohol use: No    Alcohol/week: 0.0 standard drinks     FAMILY HISTORY:   Family History  Problem Relation Age of Onset  . Heart attack Mother   . Heart disease Mother   . Heart disease Father   . Heart attack Brother        death at age 53  . Stroke Brother        death at 13  . Bladder Cancer Neg Hx   . Kidney cancer Neg Hx      DRUG ALLERGIES:   Allergies  Allergen Reactions  . Demerol [Meperidine] Nausea And Vomiting  . Macrobid [Nitrofurantoin Macrocrystal] Rash  . Penicillins Rash    Has patient had a PCN reaction causing immediate rash, facial/tongue/throat swelling, SOB or lightheadedness with hypotension: Unknown Has patient had a PCN reaction causing severe rash involving mucus membranes or skin necrosis: Unknown Has patient had a PCN reaction that required hospitalization: Unknown Has patient had a PCN reaction occurring within the last 10 years: No If all of the above answers are "NO", then may proceed with Cephalosporin use.     MEDICATIONS AT HOME:   Prior to Admission medications   Medication Sig Start Date End Date Taking? Authorizing Provider  acetaminophen (TYLENOL) 500 MG tablet Take 500 mg by mouth 3 (three) times daily.     [provider]  alendronate (FOSAMAX) 70  MG tablet TAKE 1 TABLET EVERY 7 DAYS.  SEE PACKAGE FOR ADDITIONAL INSTRUCTIONS Patient taking differently: Take 70 mg by mouth every Monday.  02/22/18   Karamalegos, Netta NeatAlexander J, DO  amLODipine (NORVASC) 10 MG tablet TAKE 1 TABLET BY MOUTH EVERY DAY Patient taking differently: Take 10 mg by mouth daily.  08/26/18   Karamalegos, Netta NeatAlexander J, DO  aspirin 81 MG tablet Take 1 tablet (81 mg total) by mouth daily. 03/07/13   Ronal FearLam, Lynn E, NP  atorvastatin (LIPITOR) 40 MG tablet Take 1 tablet (40 mg total) by mouth daily at 6 PM. 12/20/18   Enid BaasKalisetti, Radhika, MD  Cholecalciferol  (VITAMIN D3) 125 MCG (5000 UT) CAPS Take 1 capsule (5,000 Units total) by mouth daily. For 12 weeks, then start Vitamin D3 2,000 units daily (OTC) 07/29/18   Althea CharonKaramalegos, Netta NeatAlexander J, DO  clopidogrel (PLAVIX) 75 MG tablet Take 1 tablet (75 mg total) by mouth daily. 12/21/18   Enid BaasKalisetti, Radhika, MD  Cranberry-Vitamin C (AZO CRANBERRY URINARY TRACT PO) Take 2 tablets by mouth daily. 03/20/18   [provider]  docusate sodium (COLACE) 100 MG capsule Take 1 capsule (100 mg total) by mouth 2 (two) times daily. 11/26/18   Altamese DillingVachhani, Vaibhavkumar, MD  feeding supplement, ENSURE ENLIVE, (ENSURE ENLIVE) LIQD Take 237 mLs by mouth 2 (two) times daily between meals. 12/20/18   Enid BaasKalisetti, Radhika, MD  FLUoxetine (PROZAC) 40 MG capsule Take 1 capsule (40 mg total) by mouth daily. 05/28/18   Karamalegos, Netta NeatAlexander J, DO  ipratropium (ATROVENT) 0.06 % nasal spray Place 2 sprays into both nostrils 4 (four) times daily. For up to 5-7 days then stop. Patient not taking: Reported on 12/25/2018 04/10/18   Smitty CordsKaramalegos, Alexander J, DO  lisinopril (PRINIVIL,ZESTRIL) 40 MG tablet Take 1 tablet (40 mg total) by mouth daily. 02/22/18   Karamalegos, Netta NeatAlexander J, DO  LORazepam (ATIVAN) 0.5 MG tablet Take 1 tablet (0.5 mg total) by mouth every 6 (six) hours as needed for anxiety or sleep. 01/02/19   Karamalegos, Netta NeatAlexander J, DO  Multiple Vitamins-Minerals (MULTIVITAMIN WITH MINERALS) tablet Take 1 tablet by mouth daily.    [provider]  pantoprazole (PROTONIX) 20 MG tablet Take 20 mg by mouth daily.    [provider]  polyethylene glycol (MIRALAX / GLYCOLAX) 17 g packet Take 17 g by mouth daily as needed for mild constipation. 11/26/18   Altamese DillingVachhani, Vaibhavkumar, MD  potassium chloride (K-DUR,KLOR-CON) 10 MEQ tablet Take 1 tablet (10 mEq total) by mouth daily. 05/28/18   Karamalegos, Netta NeatAlexander J, DO  traMADol (ULTRAM) 50 MG tablet Take 1 tablet (50 mg total) by mouth every 6 (six) hours as needed for moderate  pain or severe pain. Patient not taking: Reported on 12/25/2018 12/20/18   Enid BaasKalisetti, Radhika, MD  traZODone (DESYREL) 100 MG tablet Take 1 tablet (100 mg total) by mouth at bedtime. 12/31/18   Karamalegos, Netta NeatAlexander J, DO  esomeprazole (NEXIUM) 20 MG capsule TAKE 1 CAPSULE (20 MG TOTAL) BY MOUTH DAILY BEFORE BREAKFAST. 12/18/18 12/23/18  Smitty CordsKaramalegos, Alexander J, DO    REVIEW OF SYSTEMS:  Review of Systems  Constitutional: Negative for chills, fever, malaise/fatigue and weight loss.  HENT: Negative for ear pain, hearing loss and tinnitus.   Eyes: Negative for blurred vision, double vision, pain and redness.  Respiratory: Negative for cough, hemoptysis and shortness of breath.   Cardiovascular: Negative for chest pain, palpitations, orthopnea and leg swelling.  Gastrointestinal: Negative for abdominal pain, constipation, diarrhea, nausea and vomiting.  Genitourinary: Negative for dysuria, frequency  and hematuria.  Musculoskeletal: Negative for back pain, joint pain and neck pain.  Skin:       No acne, rash, or lesions  Neurological: Positive for speech change. Negative for dizziness, tremors, focal weakness and weakness.  Endo/Heme/Allergies: Negative for polydipsia. Does not bruise/bleed easily.  Psychiatric/Behavioral: Negative for depression. The patient is not nervous/anxious and does not have insomnia.      VITAL SIGNS:   Vitals:   01/11/19 2110 01/11/19 2200 01/11/19 2230  BP: (!) 145/75 138/87 137/86  Pulse: 94 87 86  Resp: 16 16 17   Temp: 98.1 F (36.7 C)    TempSrc: Oral    SpO2: 98% 96% 95%  Weight: 63.5 kg    Height: 5\' 8"  (1.727 m)     Wt Readings from Last 3 Encounters:  01/11/19 63.5 kg  12/17/18 69.9 kg  11/21/18 68 kg    PHYSICAL EXAMINATION:  Physical Exam  Vitals reviewed. Constitutional: She is oriented to person, place, and time. She appears well-developed and well-nourished. No distress.  HENT:  Head: Normocephalic and atraumatic.  Mouth/Throat:  Oropharynx is clear and moist.  Eyes: Pupils are equal, round, and reactive to light. Conjunctivae and EOM are normal. No scleral icterus.  Neck: Normal range of motion. Neck supple. No JVD present. No thyromegaly present.  Cardiovascular: Normal rate, regular rhythm and intact distal pulses. Exam reveals no gallop and no friction rub.  No murmur heard. Respiratory: Effort normal and breath sounds normal. No respiratory distress. She has no wheezes. She has no rales.  GI: Soft. Bowel sounds are normal. She exhibits no distension. There is no abdominal tenderness.  Musculoskeletal: Normal range of motion.        General: No edema.     Comments: No arthritis, no gout  Lymphadenopathy:    She has no cervical adenopathy.  Neurological: She is alert and oriented to person, place, and time. No cranial nerve deficit.  Neurologic: Cranial nerves II-XII intact, Sensation intact to light touch/pinprick, 5/5 strength in all extremities, dysarthria present, pronounced expressive aphasia, no dysphagia, memory intact, finger to nose testing showed no abnormality, no pronator drift  Skin: Skin is warm and dry. No rash noted. No erythema.  Psychiatric: She has a normal mood and affect. Her behavior is normal. Judgment and thought content normal.    LABORATORY PANEL:   CBC Recent Labs  Lab 01/11/19 2114  WBC 9.9  HGB 11.6*  HCT 35.8*  PLT 370   ------------------------------------------------------------------------------------------------------------------  Chemistries  Recent Labs  Lab 01/11/19 2114  NA 139  K 3.4*  CL 103  CO2 26  GLUCOSE 123*  BUN 10  CREATININE 0.69  CALCIUM 9.1  AST 23  ALT 17  ALKPHOS 83  BILITOT 0.2*   ------------------------------------------------------------------------------------------------------------------  Cardiac Enzymes No results for input(s): TROPONINI in the last 168  hours. ------------------------------------------------------------------------------------------------------------------  RADIOLOGY:  Ct Head Code Stroke Wo Contrast  Result Date: 01/11/2019 CLINICAL DATA:  Code stroke.  Aphasia EXAM: CT HEAD WITHOUT CONTRAST TECHNIQUE: Contiguous axial images were obtained from the base of the skull through the vertex without intravenous contrast. COMPARISON:  12/17/2018 FINDINGS: Brain: There is no mass, hemorrhage or extra-axial collection. There is generalized atrophy without lobar predilection. There is hypoattenuation of the periventricular white matter, most commonly indicating chronic ischemic microangiopathy. Vascular: No abnormal hyperdensity of the major intracranial arteries or dural venous sinuses. No intracranial atherosclerosis. Skull: The visualized skull base, calvarium and extracranial soft tissues are normal. Sinuses/Orbits: No fluid levels or advanced mucosal  thickening of the visualized paranasal sinuses. No mastoid or middle ear effusion. The orbits are normal. ASPECTS [facility] Stroke Program Early CT Score) - Ganglionic level infarction (caudate, lentiform nuclei, internal capsule, insula, M1-M3 cortex): 7 - Supraganglionic infarction (M4-M6 cortex): 3 Total score (0-10 with 10 being normal): 10 IMPRESSION: 1. 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 2. ASPECTS is 10. These results were called by telephone at the time of interpretation on 01/11/2019 at 9:25 pm to Dr. Minna Antis , who verbally acknowledged these results. Electronically Signed   By: Deatra Robinson M.D.   On: 01/11/2019 21:25    EKG:   Orders placed or performed during the hospital encounter of 01/11/19  . ED EKG  . ED EKG  . EKG 12-Lead  . EKG 12-Lead    IMPRESSION AND PLAN:  Principal Problem:   Slurred speech -admit per stroke admission order set with MRI imaging, neurology consult, other appropriate consults and laboratory  evaluation Active Problems:   UTI (urinary tract infection) -UA resulted after admission and showed nitrites positive.  IV antibiotics started and culture sent   Hypertension -permissive hypertension tonight, blood pressure goal less than 220/120.  This can be reduced tomorrow to goal less than 160/100.   Anxiety -home dose anxiolytic   GERD (gastroesophageal reflux disease) -home dose PPI   Hyperlipidemia -home dose antilipid  Chart review performed and case discussed with ED provider. Labs, imaging and/or ECG reviewed by provider and discussed with patient/family. Management plans discussed with the patient and/or family.  COVID-19 status: Tested negative     DVT PROPHYLAXIS: SubQ lovenox   GI PROPHYLAXIS:  PPI   ADMISSION STATUS: Inpatient     CODE STATUS: Full, patient states that she wishes to be full code despite prior DNR order Code Status History    Date Active Date Inactive Code Status Order ID Comments User Context   12/18/2018 1405 12/23/2018 1821 DNR [id number]  Enid Baas, MD Inpatient   12/17/2018 2118 12/18/2018 1405 Full Code [id number]  Enedina Finner, MD Inpatient   11/21/2018 0159 11/26/2018 2035 Full Code [id number]  Mansy, Vernetta Honey, MD ED   12/31/2017 1641 01/02/2018 2218 Full Code [id number]  Ihor Austin, MD Inpatient   Advance Care Planning Activity    Questions for Most Recent Historical Code Status (Order [id number])    Question Answer Comment   In the event of cardiac or respiratory ARREST Do not call a "code blue"    In the event of cardiac or respiratory ARREST Do not perform Intubation, CPR, defibrillation or ACLS    In the event of cardiac or respiratory ARREST Use medication by any route, position, wound care, and other measures to relive pain and suffering. May use oxygen, suction and manual treatment of airway obstruction as needed for comfort.       TOTAL TIME TAKING CARE OF THIS PATIENT: 45 minutes.   This patient was evaluated in the context of the  global COVID-19 pandemic, which necessitated consideration that the patient might be at risk for infection with the SARS-CoV-2 virus that causes COVID-19. Institutional protocols and algorithms that pertain to the evaluation of patients at risk for COVID-19 are in a state of rapid change based on information released by regulatory bodies including the CDC and federal and state organizations. These policies and algorithms were followed to the best of this provider's knowledge to date during the patient's care at this facility.  Barney Drain 01/11/2019, 11:16 PM  [employer]  Office  [phone]  CC: Primary care physician; Smitty CordsKaramalegos, Alexander J, DO  Note:  This document was prepared using Dragon voice recognition software and may include unintentional dictation errors.

## 2019-01-12 ENCOUNTER — Inpatient Hospital Stay: Payer: Medicare HMO

## 2019-01-12 ENCOUNTER — Other Ambulatory Visit: Payer: Self-pay

## 2019-01-12 DIAGNOSIS — N390 Urinary tract infection, site not specified: Secondary | ICD-10-CM | POA: Diagnosis present

## 2019-01-12 LAB — CBC
HCT: 34.8 % — ABNORMAL LOW (ref 36.0–46.0)
Hemoglobin: 11 g/dL — ABNORMAL LOW (ref 12.0–15.0)
MCH: 27.8 pg (ref 26.0–34.0)
MCHC: 31.6 g/dL (ref 30.0–36.0)
MCV: 87.9 fL (ref 80.0–100.0)
Platelets: 345 10*3/uL (ref 150–400)
RBC: 3.96 MIL/uL (ref 3.87–5.11)
RDW: 14.2 % (ref 11.5–15.5)
WBC: 8.5 10*3/uL (ref 4.0–10.5)
nRBC: 0 % (ref 0.0–0.2)

## 2019-01-12 LAB — LIPID PANEL
Cholesterol: 113 mg/dL (ref 0–200)
HDL: 65 mg/dL (ref >40)
LDL Cholesterol: 34 mg/dL (ref 0–99)
Total CHOL/HDL Ratio: 1.7 RATIO
Triglycerides: 69 mg/dL (ref <150)
VLDL: 14 mg/dL (ref 0–40)

## 2019-01-12 LAB — HEMOGLOBIN A1C
Hgb A1c MFr Bld: 5.2 % (ref 4.8–5.6)
Mean Plasma Glucose: 102.54 mg/dL

## 2019-01-12 LAB — CREATININE, SERUM
Creatinine, Ser: 0.65 mg/dL (ref 0.44–1.00)
GFR calc Af Amer: >60 mL/min (ref >60)
GFR calc non Af Amer: >60 mL/min (ref >60)

## 2019-01-12 MED ORDER — STROKE: EARLY STAGES OF RECOVERY BOOK
Freq: Once | Status: AC
  Administered 2019-01-12: 03:00:00

## 2019-01-12 MED ORDER — ASPIRIN EC 81 MG PO TBEC
81.0000 mg | DELAYED_RELEASE_TABLET | Freq: Every day | ORAL | Status: DC
  Administered 2019-01-12 – 2019-01-16 (×5): 81 mg via ORAL
  Filled 2019-01-12 (×5): qty 1

## 2019-01-12 MED ORDER — ACETAMINOPHEN 325 MG PO TABS
650.0000 mg | ORAL_TABLET | ORAL | Status: DC | PRN
  Administered 2019-01-12 – 2019-01-14 (×5): 650 mg via ORAL
  Filled 2019-01-12 (×5): qty 2

## 2019-01-12 MED ORDER — ACETAMINOPHEN 650 MG RE SUPP: 650.0000 mg | RECTAL | Status: DC | PRN

## 2019-01-12 MED ORDER — SODIUM CHLORIDE 0.9 % IV SOLN
1.0000 g | INTRAVENOUS | Status: DC
  Administered 2019-01-12 – 2019-01-14 (×3): 1 g via INTRAVENOUS
  Filled 2019-01-12: qty 10
  Filled 2019-01-12: qty 1
  Filled 2019-01-12: qty 10
  Filled 2019-01-12: qty 1

## 2019-01-12 MED ORDER — SODIUM CHLORIDE 0.9 % IV SOLN
INTRAVENOUS | Status: DC | PRN
  Administered 2019-01-12: 250 mL via INTRAVENOUS

## 2019-01-12 MED ORDER — ATORVASTATIN CALCIUM 20 MG PO TABS
40.0000 mg | ORAL_TABLET | Freq: Every day | ORAL | Status: DC
  Administered 2019-01-12 – 2019-01-15 (×4): 40 mg via ORAL
  Filled 2019-01-12 (×4): qty 2

## 2019-01-12 MED ORDER — ACETAMINOPHEN 160 MG/5ML PO SOLN
650.0000 mg | ORAL | Status: DC | PRN
  Filled 2019-01-12: qty 20.3

## 2019-01-12 MED ORDER — ENOXAPARIN SODIUM 40 MG/0.4ML ~~LOC~~ SOLN
40.0000 mg | SUBCUTANEOUS | Status: DC
  Administered 2019-01-12 – 2019-01-16 (×5): 40 mg via SUBCUTANEOUS
  Filled 2019-01-12 (×5): qty 0.4

## 2019-01-12 MED ORDER — CLOPIDOGREL BISULFATE 75 MG PO TABS
75.0000 mg | ORAL_TABLET | Freq: Every day | ORAL | Status: DC
  Administered 2019-01-12 – 2019-01-16 (×5): 75 mg via ORAL
  Filled 2019-01-12 (×5): qty 1

## 2019-01-12 MED ORDER — PANTOPRAZOLE SODIUM 20 MG PO TBEC
20.0000 mg | DELAYED_RELEASE_TABLET | Freq: Every day | ORAL | Status: DC
  Administered 2019-01-12 – 2019-01-16 (×5): 20 mg via ORAL
  Filled 2019-01-12 (×5): qty 1

## 2019-01-12 NOTE — Evaluation (Addendum)
Physical Therapy Evaluation Patient Details Name: Jill CockerLinda S Cragle MRN: [id number] DOB: 1941/05/27 Today's Date: 01/12/2019   History of Present Illness  Pt admitted for slurred speech. MRI negative for CVA. Pt with history of HTN, CVA with residual speech deficits, vascular dementia, anxiety and depression.  Clinical Impression  Pt is a pleasant 78 year old female who was admitted for slurred speech. Pt performs bed mobility with mod assist. Unable to maintain seated at EOB due to weakness/pain. Returned back to supine. Has difficulty with coordination on L LE. Ataxic movement. Will need +2 for further mobility efforts. Pt demonstrates deficits with strength/mobility. Unsure of true baseline as pt with expressive aphasia, has difficulty communicating. Attempted twice to call daughter with pt permission, no answer. Will continue to assess. Would benefit from skilled PT to address above deficits and promote optimal return to PLOF; recommend transition to STR upon discharge from acute hospitalization.     Follow Up Recommendations SNF    Equipment Recommendations  Rolling walker with 5" wheels;3in1 (PT)    Recommendations for Other Services       Precautions / Restrictions Precautions Precautions: Fall Restrictions Weight Bearing Restrictions: No      Mobility  Bed Mobility Overal bed mobility: Needs Assistance Bed Mobility: Supine to Sit     Supine to sit: Mod assist     General bed mobility comments: assist for B LEs. Unable to fully achieve upright posture due to pain. Returned back supine.   Transfers                 General transfer comment: unable  Ambulation/Gait             General Gait Details: unable  Stairs            Wheelchair Mobility    Modified Rankin (Stroke Patients Only)       Balance       Sitting balance - Comments: unable to sit at EOB                                     Pertinent Vitals/Pain Pain  Assessment: Faces Faces Pain Scale: Hurts a little bit Pain Location: L hip - moaning at times with gentle ex Pain Descriptors / Indicators: Aching Pain Intervention(s): Limited activity within patient's tolerance    Home Living Family/patient expects to be discharged to:: Private residence Living Arrangements: Children Available Help at Discharge: Family;Available PRN/intermittently Type of Home: Apartment Home Access: Level entry     Home Layout: One level Home Equipment: Walker - 2 wheels;Shower seat      Prior Function Level of Independence: Needs assistance         Comments: per pt, she is able to ambulate with RW. Unsure of accurate history as it is difficult to communicate. 2 attempts made to call duaghter with permission from pt. No answer.     Hand Dominance        Extremity/Trunk Assessment   Upper Extremity Assessment Upper Extremity Assessment: Generalized weakness(B UE grossly 4/5)    Lower Extremity Assessment Lower Extremity Assessment: Generalized weakness(L LE grossly 3-/5; R LE grossly 3+/5)       Communication   Communication: Expressive difficulties  Cognition Arousal/Alertness: Awake/alert Behavior During Therapy: WFL for tasks assessed/performed Overall Cognitive Status: Difficult to assess Area of Impairment: (expressive aphasia)  Problem Solving: Slow processing;Requires tactile cues;Requires verbal cues        General Comments      Exercises Other Exercises Other Exercises: supine therex including B LE SLRs, AP, hip abd/add, heel slides. Pain and min assist for L LE Other Exercises: Pt incontient of urine upon arrival. Performed rolling for gown change and hygiene. Another bout of incontience, RN tech in to assist   Assessment/Plan    PT Assessment Patient needs continued PT services  PT Problem List Decreased strength;Decreased range of motion;Decreased activity tolerance;Decreased  balance;Decreased mobility;Decreased knowledge of use of DME;Decreased safety awareness;Decreased knowledge of precautions;Decreased skin integrity;Pain;Decreased cognition;Decreased coordination       PT Treatment Interventions DME instruction;Gait training;Stair training;Functional mobility training;Therapeutic activities;Therapeutic exercise;Balance training;Cognitive remediation;Neuromuscular re-education;Patient/family education    PT Goals (Current goals can be found in the Care Plan section)  Acute Rehab PT Goals Patient Stated Goal: To get better PT Goal Formulation: With patient Time For Goal Achievement: 01/26/19 Potential to Achieve Goals: Fair    Frequency Min 2X/week   Barriers to discharge        Co-evaluation               AM-PAC PT "6 Clicks" Mobility  Outcome Measure Help needed turning from your back to your side while in a flat bed without using bedrails?: A Lot Help needed moving from lying on your back to sitting on the side of a flat bed without using bedrails?: A Lot Help needed moving to and from a bed to a chair (including a wheelchair)?: Total Help needed standing up from a chair using your arms (e.g., wheelchair or bedside chair)?: Total Help needed to walk in hospital room?: Total Help needed climbing 3-5 steps with a railing? : Total 6 Click Score: 8    End of Session   Activity Tolerance: Patient limited by pain Patient left: in bed;with bed alarm set Nurse Communication: Mobility status PT Visit Diagnosis: Muscle weakness (generalized) (M62.81);Difficulty in walking, not elsewhere classified (R26.2) Pain - Right/Left: Left Pain - part of body: Hip    Time: 9030-0923 PT Time Calculation (min) (ACUTE ONLY): 21 min   Charges:   PT Evaluation $PT Eval Low Complexity: 1 Low PT Treatments $Therapeutic Exercise: 8-22 mins        Greggory Stallion, PT, DPT [phone]   Zofia Peckinpaugh 01/12/2019, 3:40 PM

## 2019-01-12 NOTE — Plan of Care (Signed)
  Problem: Education: Goal: Knowledge of General Education information will improve Description: Including pain rating scale, medication(s)/side effects and non-pharmacologic comfort measures Outcome: Progressing   Problem: Health Behavior/Discharge Planning: Goal: Ability to manage health-related needs will improve Outcome: Progressing   Problem: Clinical Measurements: Goal: Ability to maintain clinical measurements within normal limits will improve Outcome: Progressing Note: MRI of the brain today was negative for any acute neurological event. Stroke protocol orders d/c'd. Neurology is following. Patient remains on Lipitor. Will continue to monitor neurological status for the remainder of shift. Wenda Low [facility]

## 2019-01-12 NOTE — Progress Notes (Signed)
PT Cancellation Note  Patient Details Name: Jill Hancock MRN: [id number] DOB: [birth year]   Cancelled Treatment:    Reason Eval/Treat Not Completed: Other (comment). Consult received and chart reviewed. 2 attempts made this AM for evaluation. 1st attempt, pt eating breakfast and asked therapist to return. On return, pt out of room for imaging. Will re-attempt, time permitting.   [name] 01/12/2019, 10:00 AM  Greggory Stallion, PT, DPT [phone]

## 2019-01-12 NOTE — Plan of Care (Signed)
  Problem: Clinical Measurements: Goal: Ability to maintain clinical measurements within normal limits will improve Outcome: Progressing   Problem: Pain Managment: Goal: General experience of comfort will improve Outcome: Progressing   

## 2019-01-12 NOTE — Progress Notes (Signed)
   01/12/19 0600  Clinical Encounter Type  Visited With Patient  Visit Type Initial;Follow-up;ED  Referral From Nurse

## 2019-01-12 NOTE — ED Notes (Signed)
ED TO INPATIENT HANDOFF REPORT  ED Nurse Name and Phone #: Kiarra Kidd [phone]   S Name/Age/Gender Jill Hancock 78 y.o. female Room/Bed: ED13A/ED13A  Code Status   Code Status: Prior  Home/SNF/Other Home Patient oriented to: self Is this baseline? No   Triage Complete: Triage complete  Chief Complaint [facility] Poss Stroke  Triage Note Pt comes EMS from home with stroke like symptoms. Pt having sever expressive aphasia at this time and is disoriented to situation and time. LKW 130pm   Allergies Allergies  Allergen Reactions  . Demerol [Meperidine] Nausea And Vomiting  . Macrobid [Nitrofurantoin Macrocrystal] Rash  . Penicillins Rash    Has patient had a PCN reaction causing immediate rash, facial/tongue/throat swelling, SOB or lightheadedness with hypotension: Unknown Has patient had a PCN reaction causing severe rash involving mucus membranes or skin necrosis: Unknown Has patient had a PCN reaction that required hospitalization: Unknown Has patient had a PCN reaction occurring within the last 10 years: No If all of the above answers are "NO", then may proceed with Cephalosporin use.     Level of Care/Admitting Diagnosis ED Disposition    ED Disposition Condition Comment   Admit  Hospital Area: [facility] [[id number]]  Level of Care: Med-Surg [16]  Covid Evaluation: Asymptomatic Screening Protocol (No Symptoms)  Diagnosis: Slurred speech [[id number]][[id number]]  Admitting Physician: Oralia ManisWILLIS, DAVID [[id number]]  Attending Physician: Oralia ManisWILLIS, DAVID [phone][[id number]]  Estimated length of stay: past midnight tomorrow  Certification:: I certify this patient will need inpatient services for at least 2 midnights  PT Class (Do Not Modify): Inpatient [101]  PT Acc Code (Do Not Modify): Private [1]       B Medical/Surgery History Past Medical History:  Diagnosis Date  . Anxiety   . Depression   . Hyperglycemia   . Hypertension   . Hypokalemia   . Osteoporosis   . Stroke [facility](HCC)  02/2012   MRI revealed at least 3 subcentimeter acute infarctions with one area of subacute infarction in widely disparate vascular territories including territory of left cerebellum and around right caudate nucleus along with widespread lacunar infarcts, chronic microvascular ischemic change, and numerous microbleeds suggesting long standing hypertensive cerebrovascular disease.  MRA confirmed intracranial  . Unsteady gait   . Vertigo    Past Surgical History:  Procedure Laterality Date  . BREAST BIOPSY Right    pt not sure when   . CHOLECYSTECTOMY    . INTRAMEDULLARY (IM) NAIL INTERTROCHANTERIC Left 11/21/2018   Procedure: INTRAMEDULLARY (IM) NAIL INTERTROCHANTRIC;  Surgeon: Juanell FairlyKrasinski, Kevin, MD;  Location: [facility] ORS;  Service: Orthopedics;  Laterality: Left;  Marland Kitchen. VAGINAL HYSTERECTOMY       A IV Location/Drains/Wounds Patient Lines/Drains/Airways Status   Active Line/Drains/Airways    Name:   Placement date:   Placement time:   Site:   Days:   Peripheral IV 01/11/19 Left Antecubital   01/11/19    2228    Antecubital   1   External Urinary Catheter   12/21/18    1026    -   22   Incision (Closed) 12/17/18 Hip Left   12/17/18    2145     26          Intake/Output Last 24 hours No intake or output data in the 24 hours ending 01/12/19 0001  Labs/Imaging Results for orders placed or performed during the hospital encounter of 01/11/19 (from the past 48 hour(s))  Protime-INR     Status: None   Collection Time:  01/11/19  9:14 PM  Result Value Ref Range   Prothrombin Time 12.6 11.4 - 15.2 seconds   INR 1.0 0.8 - 1.2    Comment: (NOTE) INR goal varies based on device and disease states. Performed at [facility], [address] [place], [place] [address]   APTT     Status: None   Collection Time: 01/11/19  9:14 PM  Result Value Ref Range   aPTT 26 24 - 36 seconds    Comment: Performed at [facility], [address] [place], [place] [address]  CBC     Status:  Abnormal   Collection Time: 01/11/19  9:14 PM  Result Value Ref Range   WBC 9.9 4.0 - 10.5 K/uL   RBC 4.13 3.87 - 5.11 MIL/uL   Hemoglobin 11.6 (L) 12.0 - 15.0 g/dL   HCT 09.835.8 (L) 11.936.0 - 14.746.0 %   MCV 86.7 80.0 - 100.0 fL   MCH 28.1 26.0 - 34.0 pg   MCHC 32.4 30.0 - 36.0 g/dL   RDW 82.914.2 56.211.5 - 13.015.5 %   Platelets 370 150 - 400 K/uL   nRBC 0.0 0.0 - 0.2 %    Comment: Performed at [facility], [address] [place], [place] [address]  Differential     Status: None   Collection Time: 01/11/19  9:14 PM  Result Value Ref Range   Neutrophils Relative % 54 %   Neutro Abs 5.5 1.7 - 7.7 K/uL   Lymphocytes Relative 34 %   Lymphs Abs 3.3 0.7 - 4.0 K/uL   Monocytes Relative 8 %   Monocytes Absolute 0.7 0.1 - 1.0 K/uL   Eosinophils Relative 3 %   Eosinophils Absolute 0.3 0.0 - 0.5 K/uL   Basophils Relative 1 %   Basophils Absolute 0.1 0.0 - 0.1 K/uL   Immature Granulocytes 0 %   Abs Immature Granulocytes 0.04 0.00 - 0.07 K/uL    Comment: Performed at [facility], [address] [place], [place] [address]  Comprehensive metabolic panel     Status: Abnormal   Collection Time: 01/11/19  9:14 PM  Result Value Ref Range   Sodium 139 135 - 145 mmol/L   Potassium 3.4 (L) 3.5 - 5.1 mmol/L   Chloride 103 98 - 111 mmol/L   CO2 26 22 - 32 mmol/L   Glucose, Bld 123 (H) 70 - 99 mg/dL   BUN 10 8 - 23 mg/dL   Creatinine, Ser 9.520.69 0.44 - 1.00 mg/dL   Calcium 9.1 8.9 - 84.110.3 mg/dL   Total Protein 6.8 6.5 - 8.1 g/dL   Albumin 3.3 (L) 3.5 - 5.0 g/dL   AST 23 15 - 41 U/L   ALT 17 0 - 44 U/L   Alkaline Phosphatase 83 38 - 126 U/L   Total Bilirubin 0.2 (L) 0.3 - 1.2 mg/dL   GFR calc non Af Amer >60 >60 mL/min   GFR calc Af Amer >60 >60 mL/min   Anion gap 10 5 - 15    Comment: Performed at [facility], [address] [place], [place] [address]  Urine Drug Screen, Qualitative     Status: None   Collection Time: 01/11/19 10:15 PM  Result Value Ref Range   Tricyclic,  Ur Screen NONE DETECTED NONE DETECTED   Amphetamines, Ur Screen NONE DETECTED NONE DETECTED   MDMA (Ecstasy)Ur Screen NONE DETECTED NONE DETECTED   Cocaine Metabolite,Ur [place] NONE DETECTED NONE DETECTED   Opiate, Ur Screen NONE DETECTED  NONE DETECTED   Phencyclidine (PCP) Ur S NONE DETECTED NONE DETECTED   Cannabinoid 50 Ng, Ur [place] NONE DETECTED NONE DETECTED   Barbiturates, Ur Screen NONE DETECTED NONE DETECTED   Benzodiazepine, Ur Scrn NONE DETECTED NONE DETECTED   Methadone Scn, Ur NONE DETECTED NONE DETECTED    Comment: (NOTE) Tricyclics + metabolites, urine    Cutoff 1000 ng/mL Amphetamines + metabolites, urine  Cutoff 1000 ng/mL MDMA (Ecstasy), urine              Cutoff 500 ng/mL Cocaine Metabolite, urine          Cutoff 300 ng/mL Opiate + metabolites, urine        Cutoff 300 ng/mL Phencyclidine (PCP), urine         Cutoff 25 ng/mL Cannabinoid, urine                 Cutoff 50 ng/mL Barbiturates + metabolites, urine  Cutoff 200 ng/mL Benzodiazepine, urine              Cutoff 200 ng/mL Methadone, urine                   Cutoff 300 ng/mL The urine drug screen provides only a preliminary, unconfirmed analytical test result and should not be used for non-medical purposes. Clinical consideration and professional judgment should be applied to any positive drug screen result due to possible interfering substances. A more specific alternate chemical method must be used in order to obtain a confirmed analytical result. Gas chromatography / mass spectrometry (GC/MS) is the preferred confirmat ory method. Performed at [facility], [address] [place], [place] [address]   Urinalysis, Routine w reflex microscopic     Status: Abnormal   Collection Time: 01/11/19 10:15 PM  Result Value Ref Range   Color, Urine YELLOW (A) YELLOW   APPearance HAZY (A) CLEAR   Specific Gravity, Urine 1.013 1.005 - 1.030   pH 6.0 5.0 - 8.0   Glucose, UA NEGATIVE NEGATIVE mg/dL   Hgb urine  dipstick NEGATIVE NEGATIVE   Bilirubin Urine NEGATIVE NEGATIVE   Ketones, ur NEGATIVE NEGATIVE mg/dL   Protein, ur NEGATIVE NEGATIVE mg/dL   Nitrite POSITIVE (A) NEGATIVE   Leukocytes,Ua MODERATE (A) NEGATIVE   RBC / HPF 0-5 0 - 5 RBC/hpf   WBC, UA 11-20 0 - 5 WBC/hpf   Bacteria, UA RARE (A) NONE SEEN   Squamous Epithelial / LPF 0-5 0 - 5   Mucus PRESENT     Comment: Performed at [facility], [address], [place], [place] [address]  Ethanol     Status: None   Collection Time: 01/11/19 10:15 PM  Result Value Ref Range   Alcohol, Ethyl (B) <10 <10 mg/dL    Comment: (NOTE) Lowest detectable limit for serum alcohol is 10 mg/dL. For medical purposes only. Performed at [facility], [address] [place], [place] [address]   SARS Coronavirus 2 (CEPHEID - Performed in [facility] hospital lab), Hosp Order     Status: None   Collection Time: 01/11/19 10:21 PM   Specimen: Nasopharyngeal  Result Value Ref Range   SARS Coronavirus 2 NEGATIVE NEGATIVE    Comment: (NOTE) If result is NEGATIVE SARS-CoV-2 target nucleic acids are NOT DETECTED. The SARS-CoV-2 RNA is generally detectable in upper and lower  respiratory specimens during the acute phase of infection. The lowest  concentration of SARS-CoV-2 viral copies this assay can detect is 250  copies / mL. A  negative result does not preclude SARS-CoV-2 infection  and should not be used as the sole basis for treatment or other  patient management decisions.  A negative result may occur with  improper specimen collection / handling, submission of specimen other  than nasopharyngeal swab, presence of viral mutation(s) within the  areas targeted by this assay, and inadequate number of viral copies  (<250 copies / mL). A negative result must be combined with clinical  observations, patient history, and epidemiological information. If result is POSITIVE SARS-CoV-2 target nucleic acids are DETECTED. The SARS-CoV-2 RNA  is generally detectable in upper and lower  respiratory specimens dur ing the acute phase of infection.  Positive  results are indicative of active infection with SARS-CoV-2.  Clinical  correlation with patient history and other diagnostic information is  necessary to determine patient infection status.  Positive results do  not rule out bacterial infection or co-infection with other viruses. If result is PRESUMPTIVE POSTIVE SARS-CoV-2 nucleic acids MAY BE PRESENT.   A presumptive positive result was obtained on the submitted specimen  and confirmed on repeat testing.  While 2019 novel coronavirus  (SARS-CoV-2) nucleic acids may be present in the submitted sample  additional confirmatory testing may be necessary for epidemiological  and / or clinical management purposes  to differentiate between  SARS-CoV-2 and other Sarbecovirus currently known to infect humans.  If clinically indicated additional testing with an alternate test  methodology [phone]) is advised. The SARS-CoV-2 RNA is generally  detectable in upper and lower respiratory sp ecimens during the acute  phase of infection. The expected result is Negative. Fact Sheet for Patients:  StrictlyIdeas.no Fact Sheet for Healthcare Providers: BankingDealers.co.za This test is not yet approved or cleared by the [place] FDA and has been authorized for detection and/or diagnosis of SARS-CoV-2 by FDA under an Emergency Use Authorization (EUA).  This EUA will remain in effect (meaning this test can be used) for the duration of the COVID-19 declaration under Section 564(b)(1) of the Act, 21 U.S.C. section 360bbb-3(b)(1), unless the authorization is terminated or revoked sooner. Performed at [facility], [address] [place], [place] [address]    Ct Head Code Stroke Wo Contrast  Result Date: 01/11/2019 CLINICAL DATA:  Code stroke.  Aphasia EXAM: CT HEAD WITHOUT  CONTRAST TECHNIQUE: Contiguous axial images were obtained from the base of the skull through the vertex without intravenous contrast. COMPARISON:  12/17/2018 FINDINGS: Brain: There is no mass, hemorrhage or extra-axial collection. There is generalized atrophy without lobar predilection. There is hypoattenuation of the periventricular white matter, most commonly indicating chronic ischemic microangiopathy. Vascular: No abnormal hyperdensity of the major intracranial arteries or dural venous sinuses. No intracranial atherosclerosis. Skull: The visualized skull base, calvarium and extracranial soft tissues are normal. Sinuses/Orbits: No fluid levels or advanced mucosal thickening of the visualized paranasal sinuses. No mastoid or middle ear effusion. The orbits are normal. ASPECTS [facility] Stroke Program Early CT Score) - Ganglionic level infarction (caudate, lentiform nuclei, internal capsule, insula, M1-M3 cortex): 7 - Supraganglionic infarction (M4-M6 cortex): 3 Total score (0-10 with 10 being normal): 10 IMPRESSION: 1. 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 2. ASPECTS is 10. These results were called by telephone at the time of interpretation on 01/11/2019 at 9:25 pm to Dr. Harvest Dark , who verbally acknowledged these results. Electronically Signed   By: Ulyses Jarred M.D.   On: 01/11/2019 21:25    Pending Labs Unresulted Labs (From admission, onward)    Start  Ordered   Signed and Held  Hemoglobin A1c  Tomorrow morning,   R     Signed and Held   Signed and Held  Lipid panel  Tomorrow morning,   R    Comments: Fasting    Signed and Held   Signed and Held  CBC  (enoxaparin (LOVENOX)    CrCl >/= 30 ml/min)  Once,   R    Comments: Baseline for enoxaparin therapy IF NOT ALREADY DRAWN.  Notify MD if PLT < 100 K.    Signed and Held   Signed and Held  Creatinine, serum  (enoxaparin (LOVENOX)    CrCl >/= 30 ml/min)  Once,   R    Comments: Baseline for  enoxaparin therapy IF NOT ALREADY DRAWN.    Signed and Held   Signed and Held  Creatinine, serum  (enoxaparin (LOVENOX)    CrCl >/= 30 ml/min)  Weekly,   R    Comments: while on enoxaparin therapy    Signed and Held          Vitals/Pain Today's Vitals   01/11/19 2110 01/11/19 2200 01/11/19 2230 01/11/19 2315  BP: (!) 145/75 138/87 137/86   Pulse: 94 87 86 80  Resp: 16 16 17 19   Temp: 98.1 F (36.7 C)     TempSrc: Oral     SpO2: 98% 96% 95% 95%  Weight: 63.5 kg     Height: 5\' 8"  (1.727 m)     PainSc: 0-No pain       Isolation Precautions No active isolations  Medications Medications - No data to display  Mobility Non ambulatory post cva   High fall risk   Focused Assessments Neuro Assessment Handoff:  Swallow screen pass? not completed   NIH Stroke Scale ( + Modified Stroke Scale Criteria)  Interval: Initial Level of Consciousness (1a.)   : Alert, keenly responsive LOC Questions (1b. )   +: Answers neither question correctly LOC Commands (1c. )   + : Performs both tasks correctly Best Gaze (2. )  +: Normal Visual (3. )  +: No visual loss Facial Palsy (4. )    : Normal symmetrical movements Motor Arm, Left (5a. )   +: No drift Motor Arm, Right (5b. )   +: No drift Motor Leg, Left (6a. )   +: No drift Motor Leg, Right (6b. )   +: No drift Limb Ataxia (7. ): Absent Sensory (8. )   +: Normal, no sensory loss Best Language (9. )   +: Mild-to-moderate aphasia Dysarthria (10. ): Mild-to-moderate dysarthria, patient slurs at least some words and, at worst, can be understood with some difficulty Extinction/Inattention (11.)   +: No Abnormality Modified SS Total  +: 3 Complete NIHSS TOTAL: 4 Last date known well: 01/11/19 Last time known well: 1700 Neuro Assessment:   Neuro Checks:   Initial (01/11/19 2144)  Last Documented NIHSS Modified Score: 3 (01/11/19 2144) Has TPA been given? No If patient is a Neuro Trauma and patient is going to OR before floor call  report to 4N Charge nurse: [phone] or [phone]     R Recommendations: See Admitting Provider Note  Report given to:   Additional Notes:

## 2019-01-12 NOTE — Progress Notes (Signed)
[facility] [place] NAME: Jill Hancock    MR#:  [id number]  DATE OF BIRTH:  1940/08/28  SUBJECTIVE:  CHIEF COMPLAINT:   Chief Complaint  Patient presents with  . Aphasia   Came with slurred speech.  Speech is slightly better today.  Patient does not have focal abnormalities or weakness. REVIEW OF SYSTEMS:  CONSTITUTIONAL: No fever, fatigue or weakness.  EYES: No blurred or double vision.  EARS, NOSE, AND THROAT: No tinnitus or ear pain.  RESPIRATORY: No cough, shortness of breath, wheezing or hemoptysis.  CARDIOVASCULAR: No chest pain, orthopnea, edema.  GASTROINTESTINAL: No nausea, vomiting, diarrhea or abdominal pain.  GENITOURINARY: No dysuria, hematuria.  ENDOCRINE: No polyuria, nocturia,  HEMATOLOGY: No anemia, easy bruising or bleeding SKIN: No rash or lesion. MUSCULOSKELETAL: No joint pain or arthritis.   NEUROLOGIC: No tingling, numbness, weakness.  PSYCHIATRY: No anxiety or depression.   ROS  DRUG ALLERGIES:   Allergies  Allergen Reactions  . Demerol [Meperidine] Nausea And Vomiting  . Macrobid [Nitrofurantoin Macrocrystal] Rash  . Penicillins Rash    Has patient had a PCN reaction causing immediate rash, facial/tongue/throat swelling, SOB or lightheadedness with hypotension: Unknown Has patient had a PCN reaction causing severe rash involving mucus membranes or skin necrosis: Unknown Has patient had a PCN reaction that required hospitalization: Unknown Has patient had a PCN reaction occurring within the last 10 years: No If all of the above answers are "NO", then may proceed with Cephalosporin use.     VITALS:  Blood pressure 140/78, pulse 96, temperature 98.7 F (37.1 C), temperature source Oral, resp. rate 19, height 5\' 2"  (1.575 m), weight 65.1 kg, SpO2 94 %.  PHYSICAL EXAMINATION:  GENERAL:  78 y.o.-year-old patient lying in the bed with no acute distress.  EYES: Pupils equal, round, reactive to light and  accommodation. No scleral icterus. Extraocular muscles intact.  HEENT: Head atraumatic, normocephalic. Oropharynx and nasopharynx clear.  NECK:  Supple, no jugular venous distention. No thyroid enlargement, no tenderness.  LUNGS: Normal breath sounds bilaterally, no wheezing, rales,rhonchi or crepitation. No use of accessory muscles of respiration.  CARDIOVASCULAR: S1, S2 normal. No murmurs, rubs, or gallops.  ABDOMEN: Soft, nontender, nondistended. Bowel sounds present. No organomegaly or mass.  EXTREMITIES: No pedal edema, cyanosis, or clubbing.  NEUROLOGIC: Cranial nerves II through XII are intact. Muscle strength 4/5 in all extremities. Sensation intact. Gait not checked.  Patient speech is still slurred but I can understand it. PSYCHIATRIC: The patient is alert and oriented x 3.  SKIN: No obvious rash, lesion, or ulcer.   Physical Exam LABORATORY PANEL:   CBC Recent Labs  Lab 01/12/19 0600  WBC 8.5  HGB 11.0*  HCT 34.8*  PLT 345   ------------------------------------------------------------------------------------------------------------------  Chemistries  Recent Labs  Lab 01/11/19 2114 01/12/19 0600  NA 139  --   K 3.4*  --   CL 103  --   CO2 26  --   GLUCOSE 123*  --   BUN 10  --   CREATININE 0.69 0.65  CALCIUM 9.1  --   AST 23  --   ALT 17  --   ALKPHOS 83  --   BILITOT 0.2*  --    ------------------------------------------------------------------------------------------------------------------  Cardiac Enzymes No results for input(s): TROPONINI in the last 168 hours. ------------------------------------------------------------------------------------------------------------------  RADIOLOGY:  Mr Brain Wo Contrast  Result Date: 01/12/2019 CLINICAL DATA:  Focal neuro deficit, less than 6 hours. Progressive speech abnormality. Patient has had  slurred speech for 1 year with progression today. EXAM: MRI HEAD WITHOUT CONTRAST TECHNIQUE: Multiplanar, multiecho  pulse sequences of the brain and surrounding structures were obtained without intravenous contrast. COMPARISON:  CT head without contrast 01/11/2019. MRI brain 12/17/2018 FINDINGS: Brain: The diffusion-weighted images demonstrate no acute or subacute infarction. Advanced atrophy and diffuse confluent T2 hyperintensities bilaterally are stable. 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 Remote lacunar infarcts in the left corona radiata are stable. Dilated perivascular spaces are present in the basal ganglia. White matter changes extend into the brainstem. Multiple scattered foci of susceptibility are present throughout both hemispheres. These are concentrated in the basal ganglia and thalami. There is a prominent focus of susceptibility in the left paramedian pons. Foci are scattered throughout the cerebellum. No acute hemorrhage is present. Vascular: Flow is present in the major intracranial arteries. Skull and upper cervical spine: The craniocervical junction is normal. Upper cervical spine is within normal limits. Marrow signal is unremarkable. Sinuses/Orbits: The paranasal sinuses and mastoid air cells are clear. Bilateral lens replacements are noted. Globes and orbits are otherwise unremarkable. IMPRESSION: 1. No acute intracranial abnormality or significant interval change. 2. Stable appearance of advanced atrophy and diffuse white matter disease, likely reflecting the sequela of chronic microvascular ischemia. 3. 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 4. Multiple foci of susceptibility throughout both hemispheres, cerebellum, and left paramedian brainstem. This is most consistent with remote hemorrhage in suggests an underlying vasculopathy. This may be related to hypertension or amyloid angiopathy. Electronically Signed   By: Marin Robertshristopher  Mattern M.D.   On: 01/12/2019 10:46   [place] Carotid Bilateral (at [facility] Only)  Result Date: 01/12/2019 CLINICAL  DATA:  Slurred speech.  History of hypertension. EXAM: BILATERAL CAROTID DUPLEX ULTRASOUND TECHNIQUE: Wallace CullensGray scale imaging, color Doppler and duplex ultrasound were performed of bilateral carotid and vertebral arteries in the neck. COMPARISON:  None. FINDINGS: Criteria: Quantification of carotid stenosis is based on velocity parameters that correlate the residual internal carotid diameter with NASCET-based stenosis levels, using the diameter of the distal internal carotid lumen as the denominator for stenosis measurement. The following velocity measurements were obtained: RIGHT ICA: 42/14 cm/sec CCA: 63/14 cm/sec SYSTOLIC ICA/CCA RATIO:  0.7 ECA: 196 cm/sec LEFT ICA: 53/8 cm/sec CCA: 63/11 cm/sec SYSTOLIC ICA/CCA RATIO:  0.8 ECA: 124 cm/sec RIGHT CAROTID ARTERY: There is a minimal amount of eccentric echogenic plaque within the right carotid bulb (image 16), not resulting in elevated peak systolic velocities within the interrogated course of right internal carotid artery to suggest a hemodynamically significant stenosis. RIGHT VERTEBRAL ARTERY:  Antegrade Flow LEFT CAROTID ARTERY: There is a minimal amount of eccentric echogenic plaque within the left carotid bulb (image 49), extending to involve the origin and proximal aspect the left internal carotid artery (image 56), not resulting in elevated peak systolic velocities within the interrogated course of the left internal carotid artery to suggest a hemodynamically significant stenosis. LEFT VERTEBRAL ARTERY:  Antegrade flow IMPRESSION: Minimal amount of bilateral atherosclerotic plaque, left greater than right, not resulting in a hemodynamically significant stenosis within either internal carotid artery. Electronically Signed   By: Simonne ComeJohn  Watts M.D.   On: 01/12/2019 11:07   Ct Head Code Stroke Wo Contrast  Result Date: 01/11/2019 CLINICAL DATA:  Code stroke.  Aphasia EXAM: CT HEAD WITHOUT CONTRAST TECHNIQUE: Contiguous axial images were obtained from the base of  the skull through the vertex without intravenous contrast. COMPARISON:  12/17/2018 FINDINGS: Brain: There is no  mass, hemorrhage or extra-axial collection. There is generalized atrophy without lobar predilection. There is hypoattenuation of the periventricular white matter, most commonly indicating chronic ischemic microangiopathy. Vascular: No abnormal hyperdensity of the major intracranial arteries or dural venous sinuses. No intracranial atherosclerosis. Skull: The visualized skull base, calvarium and extracranial soft tissues are normal. Sinuses/Orbits: No fluid levels or advanced mucosal thickening of the visualized paranasal sinuses. No mastoid or middle ear effusion. The orbits are normal. ASPECTS [facility](Alberta Stroke Program Early CT Score) - Ganglionic level infarction (caudate, lentiform nuclei, internal capsule, insula, M1-M3 cortex): 7 - Supraganglionic infarction (M4-M6 cortex): 3 Total score (0-10 with 10 being normal): 10 IMPRESSION: 1. 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 2. ASPECTS is 10. These results were called by telephone at the time of interpretation on 01/11/2019 at 9:25 pm to Dr. Minna AntisKEVIN PADUCHOWSKI , who verbally acknowledged these results. Electronically Signed   By: Deatra RobinsonKevin  Herman M.D.   On: 01/11/2019 21:25    ASSESSMENT AND PLAN:   Principal Problem:   Slurred speech Active Problems:   Hypertension   Anxiety   GERD (gastroesophageal reflux disease)   Hyperlipidemia   UTI (urinary tract infection)  *  Slurred speech - MRI brain done and did not show any acute stroke.  Patient had some slurring since last stroke and there could be some worsening with this UTI. No further work-up or neurology consult needed.  *   UTI (urinary tract infection) -UA resulted after admission and showed nitrites positive.  IV antibiotics started and culture sent  *  Hypertension -under control now on home medications.  *  Anxiety -was on Ativan at home,  currently not getting it and continue to monitor if she gets anxious we can put her back on small dose Ativan.  *  GERD (gastroesophageal reflux disease) -home dose PPI   * Hyperlipidemia -home dose antilipid  Per daughter patient was weak and was not approved for rehab last admission but she did not do well at home could not move much so she is requesting if we can again place her in rehab this time.   All the records are reviewed and case discussed with Care Management/Social Workerr. Management plans discussed with the patient, family and they are in agreement.  CODE STATUS: Full  TOTAL TIME TAKING CARE OF THIS PATIENT: 35 minutes.   Spoke to patient's daughter on the phone.  POSSIBLE D/C IN 1-2 DAYS, DEPENDING ON CLINICAL CONDITION.   Jill Hancock M.D on 01/12/2019   Between 7am to 6pm - Pager - [phone]  After 6pm go to [email or web address] - password [email or web address]  [facility]  Office  [phone]  CC: Primary care physician; Smitty CordsKaramalegos, Alexander J, DO  Note: This dictation was prepared with Dragon dictation along with smaller phrase technology. Any transcriptional errors that result from this process are unintentional.

## 2019-01-13 ENCOUNTER — Telehealth: Payer: Medicare HMO

## 2019-01-13 ENCOUNTER — Inpatient Hospital Stay
Admit: 2019-01-13 | Discharge: 2019-01-13 | Disposition: A | Discharge status: Home / Self Care | Payer: Medicare HMO | Attending: Internal Medicine | Admitting: Internal Medicine

## 2019-01-13 ENCOUNTER — Ambulatory Visit: Payer: Self-pay | Admitting: *Deleted

## 2019-01-13 DIAGNOSIS — F01518 Vascular dementia, unspecified severity, with other behavioral disturbance: Secondary | ICD-10-CM

## 2019-01-13 DIAGNOSIS — F0151 Vascular dementia with behavioral disturbance: Secondary | ICD-10-CM

## 2019-01-13 LAB — ECHOCARDIOGRAM COMPLETE
Height: 62 in
Weight: 2297.6 oz

## 2019-01-13 MED ORDER — OXYCODONE-ACETAMINOPHEN 5-325 MG PO TABS
1.0000 | ORAL_TABLET | Freq: Four times a day (QID) | ORAL | Status: DC | PRN
  Administered 2019-01-13 – 2019-01-14 (×3): 1 via ORAL
  Filled 2019-01-13 (×4): qty 1

## 2019-01-13 NOTE — Progress Notes (Signed)
*  PRELIMINARY RESULTS* Echocardiogram 2D Echocardiogram has been performed.  Jill Hancock 01/13/2019, 9:15 AM

## 2019-01-13 NOTE — TOC Benefit Eligibility Note (Signed)
Transition of Care [facility]) Benefit Eligibility Note    Patient Details  Name: Jill Hancock MRN: [id number] Date of Birth: 1940-08-15   SNF Benefits:  Number called: [phone] Rep: Jordan Hawks Reference Number: [id number]  [employer] Medicare Hum[employer] Gold Plus HMO plan effective as of 07/03/17 with no deductible.  Out of pocket max is $3,400, $6,950.72 met as of time of call.     In-network SNF: $0 copay for days 1-20 and a $178 daily copay for days 21-100.  Once out of pocket is reached, patient covered at 100% for remainder of 100 day benefit period.   [name] is required: [phone].     [name] Phone Number: [phone] or [phone] 01/13/2019, 1:32 PM

## 2019-01-13 NOTE — Progress Notes (Signed)
[facility] [place] [facility]   PATIENT NAME: Jill RalphLinda Hancock    MR#:  [id number]  DATE OF BIRTH:  Jun 16, 1941  SUBJECTIVE:  CHIEF COMPLAINT:   Chief Complaint  Patient presents with  . Aphasia   Came with slurred speech.  Speech is slightly better today.  Patient does not have focal abnormalities or weakness.  REVIEW OF SYSTEMS:  CONSTITUTIONAL: No fever, fatigue or weakness.  EYES: No blurred or double vision.  EARS, NOSE, AND THROAT: No tinnitus or ear pain.  RESPIRATORY: No cough, shortness of breath, wheezing or hemoptysis.  CARDIOVASCULAR: No chest pain, orthopnea, edema.  GASTROINTESTINAL: No nausea, vomiting, diarrhea or abdominal pain.  GENITOURINARY: No dysuria, hematuria.  ENDOCRINE: No polyuria, nocturia,  HEMATOLOGY: No anemia, easy bruising or bleeding SKIN: No rash or lesion. MUSCULOSKELETAL: No joint pain or arthritis.   NEUROLOGIC: No tingling, numbness, weakness.  PSYCHIATRY: No anxiety or depression.   ROS  DRUG ALLERGIES:   Allergies  Allergen Reactions  . Demerol [Meperidine] Nausea And Vomiting  . Macrobid [Nitrofurantoin Macrocrystal] Rash  . Penicillins Rash    Has patient had a PCN reaction causing immediate rash, facial/tongue/throat swelling, SOB or lightheadedness with hypotension: Unknown Has patient had a PCN reaction causing severe rash involving mucus membranes or skin necrosis: Unknown Has patient had a PCN reaction that required hospitalization: Unknown Has patient had a PCN reaction occurring within the last 10 years: No If all of the above answers are "NO", then may proceed with Cephalosporin use.     VITALS:  Blood pressure (!) 147/83, pulse 91, temperature 98.1 F (36.7 C), temperature source Oral, resp. rate 18, height 5\' 2"  (1.575 m), weight 65.1 kg, SpO2 95 %.  PHYSICAL EXAMINATION:  GENERAL:  78 y.o.-year-old patient lying in the bed with no acute distress.  EYES: Pupils equal, round, reactive to light  and accommodation. No scleral icterus. Extraocular muscles intact.  HEENT: Head atraumatic, normocephalic. Oropharynx and nasopharynx clear.  NECK:  Supple, no jugular venous distention. No thyroid enlargement, no tenderness.  LUNGS: Normal breath sounds bilaterally, no wheezing, rales,rhonchi or crepitation. No use of accessory muscles of respiration.  CARDIOVASCULAR: S1, S2 normal. No murmurs, rubs, or gallops.  ABDOMEN: Soft, nontender, nondistended. Bowel sounds present. No organomegaly or mass.  EXTREMITIES: No pedal edema, cyanosis, or clubbing.  NEUROLOGIC: Cranial nerves II through XII are intact. Muscle strength 4/5 in all extremities. Sensation intact. Gait not checked.  Patient speech is still slurred but I can understand it. PSYCHIATRIC: The patient is alert and oriented x 3.  SKIN: No obvious rash, lesion, or ulcer.   Physical Exam LABORATORY PANEL:   CBC Recent Labs  Lab 01/12/19 0600  WBC 8.5  HGB 11.0*  HCT 34.8*  PLT 345   ------------------------------------------------------------------------------------------------------------------  Chemistries  Recent Labs  Lab 01/11/19 2114 01/12/19 0600  NA 139  --   K 3.4*  --   CL 103  --   CO2 26  --   GLUCOSE 123*  --   BUN 10  --   CREATININE 0.69 0.65  CALCIUM 9.1  --   AST 23  --   ALT 17  --   ALKPHOS 83  --   BILITOT 0.2*  --    ------------------------------------------------------------------------------------------------------------------  Cardiac Enzymes No results for input(s): TROPONINI in the last 168 hours. ------------------------------------------------------------------------------------------------------------------  RADIOLOGY:  Mr Brain Wo Contrast  Result Date: 01/12/2019 CLINICAL DATA:  Focal neuro deficit, less than 6 hours. Progressive speech abnormality. Patient  has had slurred speech for 1 year with progression today. EXAM: MRI HEAD WITHOUT CONTRAST TECHNIQUE: Multiplanar,  multiecho pulse sequences of the brain and surrounding structures were obtained without intravenous contrast. COMPARISON:  CT head without contrast 01/11/2019. MRI brain 12/17/2018 FINDINGS: Brain: The diffusion-weighted images demonstrate no acute or subacute infarction. Advanced atrophy and diffuse confluent T2 hyperintensities bilaterally are stable. 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 Remote lacunar infarcts in the left corona radiata are stable. Dilated perivascular spaces are present in the basal ganglia. White matter changes extend into the brainstem. Multiple scattered foci of susceptibility are present throughout both hemispheres. These are concentrated in the basal ganglia and thalami. There is a prominent focus of susceptibility in the left paramedian pons. Foci are scattered throughout the cerebellum. No acute hemorrhage is present. Vascular: Flow is present in the major intracranial arteries. Skull and upper cervical spine: The craniocervical junction is normal. Upper cervical spine is within normal limits. Marrow signal is unremarkable. Sinuses/Orbits: The paranasal sinuses and mastoid air cells are clear. Bilateral lens replacements are noted. Globes and orbits are otherwise unremarkable. IMPRESSION: 1. No acute intracranial abnormality or significant interval change. 2. Stable appearance of advanced atrophy and diffuse white matter disease, likely reflecting the sequela of chronic microvascular ischemia. 3. 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 4. Multiple foci of susceptibility throughout both hemispheres, cerebellum, and left paramedian brainstem. This is most consistent with remote hemorrhage in suggests an underlying vasculopathy. This may be related to hypertension or amyloid angiopathy. Electronically Signed   By: San Morelle M.D.   On: 01/12/2019 10:46   [place] Carotid Bilateral (at [facility] Only)  Result Date:  01/12/2019 CLINICAL DATA:  Slurred speech.  History of hypertension. EXAM: BILATERAL CAROTID DUPLEX ULTRASOUND TECHNIQUE: Pearline Cables scale imaging, color Doppler and duplex ultrasound were performed of bilateral carotid and vertebral arteries in the neck. COMPARISON:  None. FINDINGS: Criteria: Quantification of carotid stenosis is based on velocity parameters that correlate the residual internal carotid diameter with NASCET-based stenosis levels, using the diameter of the distal internal carotid lumen as the denominator for stenosis measurement. The following velocity measurements were obtained: RIGHT ICA: 42/14 cm/sec CCA: 83/38 cm/sec SYSTOLIC ICA/CCA RATIO:  0.7 ECA: 196 cm/sec LEFT ICA: 53/8 cm/sec CCA: 25/05 cm/sec SYSTOLIC ICA/CCA RATIO:  0.8 ECA: 124 cm/sec RIGHT CAROTID ARTERY: There is a minimal amount of eccentric echogenic plaque within the right carotid bulb (image 16), not resulting in elevated peak systolic velocities within the interrogated course of right internal carotid artery to suggest a hemodynamically significant stenosis. RIGHT VERTEBRAL ARTERY:  Antegrade Flow LEFT CAROTID ARTERY: There is a minimal amount of eccentric echogenic plaque within the left carotid bulb (image 49), extending to involve the origin and proximal aspect the left internal carotid artery (image 56), not resulting in elevated peak systolic velocities within the interrogated course of the left internal carotid artery to suggest a hemodynamically significant stenosis. LEFT VERTEBRAL ARTERY:  Antegrade flow IMPRESSION: Minimal amount of bilateral atherosclerotic plaque, left greater than right, not resulting in a hemodynamically significant stenosis within either internal carotid artery. Electronically Signed   By: Sandi Mariscal M.D.   On: 01/12/2019 11:07   Ct Head Code Stroke Wo Contrast  Result Date: 01/11/2019 CLINICAL DATA:  Code stroke.  Aphasia EXAM: CT HEAD WITHOUT CONTRAST TECHNIQUE: Contiguous axial images were  obtained from the base of the skull through the vertex without intravenous contrast. COMPARISON:  12/17/2018 FINDINGS: Brain: There  is no mass, hemorrhage or extra-axial collection. There is generalized atrophy without lobar predilection. There is hypoattenuation of the periventricular white matter, most commonly indicating chronic ischemic microangiopathy. Vascular: No abnormal hyperdensity of the major intracranial arteries or dural venous sinuses. No intracranial atherosclerosis. Skull: The visualized skull base, calvarium and extracranial soft tissues are normal. Sinuses/Orbits: No fluid levels or advanced mucosal thickening of the visualized paranasal sinuses. No mastoid or middle ear effusion. The orbits are normal. ASPECTS [facility](Alberta Stroke Program Early CT Score) - Ganglionic level infarction (caudate, lentiform nuclei, internal capsule, insula, M1-M3 cortex): 7 - Supraganglionic infarction (M4-M6 cortex): 3 Total score (0-10 with 10 being normal): 10 IMPRESSION: 1. 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 2. ASPECTS is 10. These results were called by telephone at the time of interpretation on 01/11/2019 at 9:25 pm to Dr. Minna AntisKEVIN PADUCHOWSKI , who verbally acknowledged these results. Electronically Signed   By: Deatra RobinsonKevin  Herman M.D.   On: 01/11/2019 21:25    ASSESSMENT AND PLAN:   Principal Problem:   Slurred speech Active Problems:   Hypertension   Anxiety   GERD (gastroesophageal reflux disease)   Hyperlipidemia   UTI (urinary tract infection)  *  Slurred speech - MRI brain done and did not show any acute stroke.  Patient had some slurring since last stroke and there could be some worsening with this UTI. No further work-up or neurology consult needed. Need to follow with speech as outpatient.  *   UTI (urinary tract infection) -UA resulted after admission and showed nitrites positive.  IV antibiotics started and culture sent.  *  Hypertension -under  control now on home medications.  *  Anxiety -was on Ativan at home, currently not getting it and continue to monitor if she gets anxious we can put her back on small dose Ativan.  *  GERD (gastroesophageal reflux disease) -home dose PPI   * Hyperlipidemia -home dose antilipid  Per daughter patient was weak and was not approved for rehab last admission but she did not do well at home could not move much so she is requesting if we can again place her in rehab this time.   All the records are reviewed and case discussed with Care Management/Social Workerr. Management plans discussed with the patient, family and they are in agreement.  CODE STATUS: Full  TOTAL TIME TAKING CARE OF THIS PATIENT: 35 minutes.   Spoke to patient's daughter on the phone.  POSSIBLE D/C IN 1-2 DAYS, DEPENDING ON CLINICAL CONDITION.   Altamese DillingVaibhavkumar Catalina Salasar M.D on 01/13/2019   Between 7am to 6pm - Pager - [phone]  After 6pm go to [email or web address] - password [email or web address]  [facility]  Office  [phone]  CC: Primary care physician; Smitty CordsKaramalegos, Alexander J, DO  Note: This dictation was prepared with Dragon dictation along with smaller phrase technology. Any transcriptional errors that result from this process are unintentional.

## 2019-01-13 NOTE — Chronic Care Management (AMB) (Signed)
  Chronic Care Management   Follow Up Note   01/13/2019 Name: CALIANNE LARUE MRN: [id number] DOB: Nov 01, 1940  Referred by: Olin Hauser, DO Reason for referral : Care Coordination (collaboration with Acute CM team )   SHAUNI HENNER is a 78 y.o. year old female who is a primary care patient of Olin Hauser, DO. The CCM team was consulted for assistance with chronic disease management and care coordination needs.    Review of patient status, including review of consultants reports, relevant laboratory and other test results, and collaboration with appropriate care team members and the patient's provider was performed as part of comprehensive patient evaluation and provision of chronic care management services.    Goals Addressed            This Visit's Progress   . We need help getting mom into long term care. We are unable to care for her. (pt-stated)       Current Barriers:  Marland Kitchen Knowledge Deficits related to the process of pursuing LTC for patient from home . Lacks caregiver support.  . Film/video editor.  . Cognitive Deficits . Daughters unable to care for mother with dementia and broken hip with behavior disturbances at home.  Nurse Case Manager Clinical Goal(s):  Marland Kitchen Over the next 30 days RNCM will work with family to have respite in the home and transfer the patient to a LTC facility.   Interventions:  . Collaboration call placed to [facility] to collaborate and share information.  . Inpatient RNCM has spoke to patient's daughter, Cecille Rubin . Inpatient team assisting patient and family with appropriate level of care options  Patient Self Care Activities:  . Currently UNABLE TO independently care for patient at home requesting assistance getting patient to LTC   Please see past updates related to this goal by clicking on the "Past Updates" button in the selected goal          The care management team will reach out to the patient  again over the next 30 days.    Merlene Morse Quinlan Mcfall RN, BSN Nurse Case Editor, commissioning [facility]  [phone]) Business Mobile

## 2019-01-13 NOTE — Evaluation (Signed)
Occupational Therapy Evaluation Patient Details Name: Jill CockerLinda S Hancock MRN: [id number] DOB: 04-15-1941 Today's Date: 01/13/2019    History of Present Illness Pt admitted for slurred speech. MRI negative for CVA. Pt with history of HTN, CVA with residual speech deficits, vascular dementia, anxiety and depression.   Clinical Impression   Jill Hancock was seen for OT evaluation this date. Pt continues to have difficulty with expressive communication, limited history obtained form pt on this date. Information on PLOF and home set-up obtained primarily through chart review. Per chart, prior to hospital admission, pt was living with support form her adult children in a 1 level home with level entry. Pt required assistance with ADL tasks, and uses a RW for amb. Pt denies this and states she was independent with all aspects of ADL. Currently pt demonstrates impairments in expressive communication, motor planning and sequencing, as well as decreased activity tolerance and balance due to significant decreased function throughout BLE. She currently requires mod assist to complete sup<>sit transfer and fatigues quickly with functional activity EOB. PT was able to maintain seated balance with 1 UE support while completing oral care EOB, but required multimodal cueing for appropriate sequencing, initiation, and termination of this task. Pt attempt x3 to complete lateral scoots at EOB on this date. Was unable to complete with max assist. Will require +2 assist for standing tasks. +2 assist for bed boost on this date. Pt notably frustrated with limited communication ability. OT utilized therapeutic use of self and active listening to provide encouragement and support to this pt throughout session. Pt would benefit from skilled OT to address noted impairments and functional limitations (see below for any additional details) in order to maximize safety and independence while minimizing falls risk and caregiver burden.   Upon hospital discharge, recommend pt discharge to STR. .     Follow Up Recommendations  SNF    Equipment Recommendations  Other (comment)(TBD at next venue of care.)    Recommendations for Other Services       Precautions / Restrictions Precautions Precautions: Fall Restrictions Weight Bearing Restrictions: No      Mobility Bed Mobility Overal bed mobility: Needs Assistance Bed Mobility: Supine to Sit;Sit to Supine     Supine to sit: Mod assist;HOB elevated Sit to supine: Mod assist   General bed mobility comments: Assist for BLEs. Pt able to sit EOB with improved balance and stability on this date. Pt limited 2/2 increased pain in BLE.  Transfers                 General transfer comment: Deferred. Pt unable to complete lateral scoot at EOB with max assist on this date. +2 assist required for standing at this time.    Balance Overall balance assessment: Needs assistance Sitting-balance support: Single extremity supported;Feet supported Sitting balance-Leahy Scale: Fair Sitting balance - Comments: Pt able to sit EOB with supervision to CGA assist. Pt noted to fatigue after ~5 min sitting and completing of functional task EOB. Required min assist for final 1-2 minutes of seated activity on this date. Noted R lateral lean. Postural control: Right lateral lean     Standing balance comment: Deferred.                           ADL either performed or assessed with clinical judgement   ADL Overall ADL's : Needs assistance/impaired Eating/Feeding: Sitting;Set up;Minimal assistance   Grooming: Set up;Minimal assistance;Sitting   Upper Body Bathing:  Set up;Minimal assistance;Sitting   Lower Body Bathing: Set up;Maximal assistance;Sitting/lateral leans   Upper Body Dressing : Set up;Minimal assistance;Sitting   Lower Body Dressing: Set up;Maximal assistance;Sit to/from stand;+2 for physical assistance   Toilet Transfer: +2 for physical  assistance;Stand-pivot;BSC;Moderate assistance;Maximal assistance   Toileting- Clothing Manipulation and Hygiene: Minimal assistance;Moderate assistance;Sit to/from stand;+2 for physical assistance               Vision Baseline Vision/History: No visual deficits Patient Visual Report: No change from baseline Additional Comments: Will continue to assess in functional context.     Perception     Praxis Praxis Praxis-Other Comments: Pt noted to have difficulty with motor planning and object use on this date. Required multimodal cueing for appropriate use of toothbrush on this date. Difficulty noted with sequencing and termination of motor tasks on this date as well.    Pertinent Vitals/Pain Pain Assessment: Faces Faces Pain Scale: Hurts worst Pain Location: BLE, pt endorsed significant pain with Pain Descriptors / Indicators: Pins and needles;Sharp Pain Intervention(s): Limited activity within patient's tolerance;Monitored during session;Utilized relaxation techniques;Repositioned     Hand Dominance Right   Extremity/Trunk Assessment Upper Extremity Assessment Upper Extremity Assessment: Overall WFL for tasks assessed(Grossly 4/5 in BUE. Pt noted to have some difficulty with motor planning/intentional movements on this date.)   Lower Extremity Assessment Lower Extremity Assessment: Generalized weakness;Defer to PT evaluation;Difficult to assess due to impaired cognition   Cervical / Trunk Assessment Cervical / Trunk Assessment: Kyphotic   Communication Communication Communication: Expressive difficulties   Cognition Arousal/Alertness: Awake/alert Behavior During Therapy: WFL for tasks assessed/performed;Agitated Overall Cognitive Status: No family/caregiver present to determine baseline cognitive functioning Area of Impairment: Orientation;Attention;Following commands;Problem solving                 Orientation Level: Time;Disoriented to;Situation     Following  Commands: Follows one step commands with increased time;Follows one step commands inconsistently     Problem Solving: Slow processing;Requires tactile cues;Requires verbal cues General Comments: Upon arrival to pt room pt awake/alert, semi-sup in bed. When asked how she was, pt stated "All these people are stupid" and expressed discontent at "everyone". Pt also regularly became visibly frustrated when unable to express herself clearly. OT utilized active listening and therapeutic use of self to provide emotional support throughout OT eval.   General Comments       Exercises Other Exercises Other Exercises: Pt assisted with grooming EOB. VCs for sequencing, initiation, and termination on this date. OT provided set-up assist for oral care and min assist to maintain seated balance at EOB toward end of task. Other Exercises: Pt educated in bed mobility techniques for sup<>sit transfer as well as strategies for maintaining seated balance at EOB.   Shoulder Instructions      Home Living Family/patient expects to be discharged to:: Private residence Living Arrangements: Children Available Help at Discharge: Family;Available PRN/intermittently Type of Home: Apartment Home Access: Level entry     Home Layout: One level     Bathroom Shower/Tub: Producer, television/film/videoWalk-in shower   Bathroom Toilet: Handicapped height Bathroom Accessibility: Yes   Home Equipment: Environmental consultantWalker - 2 wheels;Shower seat          Prior Functioning/Environment Level of Independence: Needs assistance    ADL's / Homemaking Assistance Needed: Per chart, pt dtr reports pt requires assist with bathing, dressing, and medication mgt. Recieves meals on wheels.   Comments: per pt, she is able to ambulate with RW. Unsure of accurate history as it is difficult  to communicate.        OT Problem List: Decreased strength;Decreased range of motion;Decreased knowledge of use of DME or AE;Decreased cognition;Decreased activity tolerance;Impaired  balance (sitting and/or standing);Decreased safety awareness;Pain;Decreased coordination      OT Treatment/Interventions: Self-care/ADL training;Neuromuscular education;Therapeutic exercise;Therapeutic activities;Patient/family education;DME and/or AE instruction;Balance training;Modalities;Cognitive remediation/compensation    OT Goals(Current goals can be found in the care plan section) Acute Rehab OT Goals Patient Stated Goal: To get better OT Goal Formulation: With patient Time For Goal Achievement: 01/27/19 Potential to Achieve Goals: Good ADL Goals Pt Will Perform Grooming: sitting;with min assist;with adaptive equipment(With LRAD prn for improved safety and functional independence.) Pt Will Perform Upper Body Dressing: with adaptive equipment;sitting;with min assist(With LRAD prn for improved safety and functional independence.) Pt Will Perform Lower Body Dressing: with mod assist;with adaptive equipment;sit to/from stand;with min assist(With LRAD prn for improved safety and functional independence.) Pt Will Transfer to Toilet: stand pivot transfer;bedside commode;with min assist(With LRAD prn for improved safety and functional independence.)  OT Frequency: Min 2X/week   Barriers to D/C: Inaccessible home environment;Decreased caregiver support          Co-evaluation              AM-PAC OT "6 Clicks" Daily Activity     Outcome Measure Help from another person eating meals?: A Little Help from another person taking care of personal grooming?: A Little Help from another person toileting, which includes using toliet, bedpan, or urinal?: A Lot Help from another person bathing (including washing, rinsing, drying)?: A Lot   Help from another person to put on and taking off regular lower body clothing?: A Lot 6 Click Score: 12   End of Session    Activity Tolerance: Patient tolerated treatment well Patient left: in bed;with call bell/phone within reach;with bed alarm  set  OT Visit Diagnosis: Unsteadiness on feet (R26.81);Muscle weakness (generalized) (M62.81);History of falling (Z91.81);Pain Pain - Right/Left: (Both) Pain - part of body: Knee;Hip;Leg                Time: 1323-1350 OT Time Calculation (min): 27 min Charges:  OT General Charges $OT Visit: 1 Visit OT Evaluation $OT Eval Moderate Complexity: 1 Mod OT Treatments $Self Care/Home Management : 23-37 mins  Shara Blazing, M.S., OTR/L Ascom: [phone] 01/13/19, 3:13 PM

## 2019-01-13 NOTE — Evaluation (Signed)
Speech Language Pathology Evaluation Patient Details Name: Jill CockerLinda S Hancock MRN: [id number] DOB: 1940-08-22 Today's Date: 01/13/2019 Time: [id number] SLP Time Calculation (min) (ACUTE ONLY): 48 min  Problem List:  Patient Active Problem List   Diagnosis Date Noted  . UTI (urinary tract infection) 01/12/2019  . Slurred speech 01/11/2019  . Palliative care patient 01/02/2019  . CVA (cerebral vascular accident) (HCC) 12/17/2018  . Closed left hip fracture (HCC) 11/21/2018  . Pre-diabetes 07/25/2018  . Multiple rib fractures 12/31/2017  . Primary osteoarthritis of right knee 12/14/2017  . Dementia, vascular (HCC) 12/04/2017  . Right shoulder injury 11/12/2015  . Urinary urgency 09/17/2015  . Hyperglycemia 07/29/2015  . Restless leg 06/07/2015  . Benign paroxysmal positional vertigo 06/07/2015  . Hyperlipidemia 04/16/2015  . Vitamin D deficiency 08/13/2014  . T12 compression fracture (HCC) 04/28/2014  . 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 Major depression, recurrent, chronic (HCC) 11/24/2012  . GERD (gastroesophageal reflux disease) 11/24/2012  . Osteoporosis 09/05/2012  . Right hip pain 08/23/2012  . Anxiety 05/04/2012  . Hypertension 02/27/2012  . History of cerebrovascular accident (CVA) with residual deficit 02/25/2012  . Unsteady gait 02/25/2012   Past Medical History:  Past Medical History:  Diagnosis Date  . Anxiety   . Depression   . Hyperglycemia   . Hypertension   . Hypokalemia   . Osteoporosis   . Stroke [facility](HCC) 02/2012   MRI revealed at least 3 subcentimeter acute infarctions with one area of subacute infarction in widely disparate vascular territories including territory of left cerebellum and around right caudate nucleus along with widespread lacunar infarcts, chronic microvascular ischemic change, and numerous microbleeds suggesting long standing hypertensive cerebrovascular disease.  MRA confirmed intracranial  . Unsteady gait   . Vertigo    Past Surgical  History:  Past Surgical History:  Procedure Laterality Date  . BREAST BIOPSY Right    pt not sure when   . CHOLECYSTECTOMY    . INTRAMEDULLARY (IM) NAIL INTERTROCHANTERIC Left 11/21/2018   Procedure: INTRAMEDULLARY (IM) NAIL INTERTROCHANTRIC;  Surgeon: Juanell FairlyKrasinski, Kevin, MD;  Location: [facility] ORS;  Service: Orthopedics;  Laterality: Left;  Marland Kitchen. VAGINAL HYSTERECTOMY     HPI:  Per admitting H&P: "Jill Hancock  is a 78 y.o. female who presents with chief complaint as above.  Patient brought to the ED for worsening speech problems.  She has prior history of stroke, and states that she has had slurred speech and speech difficulty for the past year.  However, family states that her speech problems today are much worse than her baseline.  Initial evaluation here in the ED is largely within normal limits in terms of stroke work-up.  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 Spontaneous speech is largely c/b dysfluent short phrases with occasional phonemic or semantic paraphasias. Pt is unaware of errors and word finding difficulty. Noted regular "groping for words" throughout evaluation. Difficulty following mod complex 1 step directions especially when involving R & L. Oriented to self and place, but not time or situation. Pt reported it is currently December 1984 and reported she thinks she fell in her kitchen but that she's not really sure why she's here. Pt appeared to answer simple wh ?'s re: object function with accuracy; however  unable to accurately answer simple Wh- questions re: time or more complex wh' questions. Read and followed directions presented in short phrases with accuracy; however unable to read & perform  commands of mildly increased compolexity written as sentences. Difficulty writing first name; however able to perform with accuracy. Pt reports she doesn't write much but that she likes to read. Dysarthria c/b mild-mod imprecision of articulatory contacts and low vocal intensity. Pt is endentulous and states she doesn't own dentures, will need to confirm with family, which may be impacting articulatory precision. Pt would benefit from regular SLP tx at d/c to SNF to improve independent functional communication skills, and provide remediation of receptive/expressive aphasia. Pt denies dysphagia or s/s aspiration with current regular diet with thin liquids. Nsg reports toleration of current diet. SLP to f/u to provide pt education re: compensatory communication strategies, and to provide facilitation of receptive and expressive language.   SLP Assessment  SLP Recommendation/Assessment: Patient needs continued Speech Lanaguage Pathology Services SLP Visit Diagnosis: Aphasia (R47.01);Dysarthria and anarthria (R47.1)    Follow Up Recommendations  Skilled Nursing facility    Frequency and Duration min 2x/week  2 weeks      SLP Evaluation Cognition  Overall Cognitive Status: No family/caregiver present to determine baseline cognitive functioning Arousal/Alertness: Awake/alert Orientation Level: Oriented to person;Oriented to place Attention: Focused;Sustained Focused Attention: Appears intact Memory: (Difficult to determine due to severity of exp aphasia) Awareness: Impaired Problem Solving: Impaired Executive Function: Reasoning;Sequencing;Organizing;Decision Making;Self Monitoring;Self Correcting Safety/Judgment: Impaired Comments: Due to severity of receptive/expressive aphasia, difficult to determine severity of cogntive impairment;howver pt appears min oriented to current situation and unable to provide any appropriate answers to demonstrate safety & judgement is intact       Comprehension   Auditory Comprehension Overall Auditory Comprehension: Impaired Yes/No Questions: Within Functional Limits(for 6 simple, 4 mod complex y/n ?'s) Commands: Impaired(3/3 simple 1 step commands, 0/2 simple 1 step requiring R/L ) Conversation: Simple Other Conversation Comments: Spontaneous speech c/b single words, small dysfluent phrases often containing phonemic and/or semantic paraphasias. Pt appears largely unaware of her errors and her word finding difficulty. Interfering Components: Other (comment)(Prior hx expressive aphasia due to previous CVA) EffectiveTechniques: Slowed speech;Stressing words;Visual/Gestural cues Visual Recognition/Discrimination Discrimination: Within Function Limits Reading Comprehension Reading Status: Impaired Word level: Within functional limits Sentence Level: Impaired Effective Techniques: Verbal cueing;Visual cueing    Expression Expression Primary Mode of Expression: Verbal Verbal Expression Overall Verbal Expression: Impaired(Some degree of impairment at baseline, need to determine w/f) Initiation: Impaired Automatic Speech: Name;Social Response Level of Generative/Spontaneous Verbalization: Word;Phrase Repetition: No impairment Naming: Impairment Responsive: 26-50% accurate Confrontation: Impaired Convergent: Not tested Divergent: 0-24% accurate Verbal Errors: Semantic paraphasias;Phonemic paraphasias;Perseveration;Not aware of errors Interfering Components: Premorbid deficit Effective Techniques: Sentence completion;Phonemic cues Written Expression Dominant Hand: Right Written Expression: Exceptions to [facility]   Oral / Motor  Oral Motor/Sensory Function Overall Oral Motor/Sensory Function: Generalized oral weakness Facial Symmetry: Within Functional Limits Motor Speech Overall Motor Speech: Other (comment)(Possibly impaired at baseline) Respiration: Impaired Level of Impairment: Phrase Phonation: Breathy;Low vocal intensity Resonance: Within  functional limits Articulation: Impaired Level of Impairment: Word Intelligibility: Intelligibility reduced Word: 75-100% accurate Phrase: 75-100% accurate Sentence: 75-100% accurate Motor Planning: Impaired Level of Impairment: Phrase Motor Speech Errors: Unaware;Groping for words;Inconsistent Interfering Components: Premorbid status;Inadequate dentition(endentulous, states she does not have dentures) Effective Techniques: Increased vocal intensity;Over-articulate   GO                    Takeshi Teasdale, MA, CCC-SLP 01/13/2019,  1:06 PM

## 2019-01-13 NOTE — Progress Notes (Signed)
Please note, patient is currently followed by outpatient Palliative at home. She would continue to be followed at discharge. Per chart note review patient may D/C to SNF. CMRN Geoffry Paradise made aware. Flo Shanks [facility], [place] [phone]

## 2019-01-13 NOTE — NC FL2 (Signed)
[place] LEVEL OF CARE SCREENING TOOL     IDENTIFICATION  Patient Name: Jill Hancock Birthdate: 1941/01/24 Sex: female Admission Date (Current Location): 01/11/2019  [place] and [place] Number:  Engineering geologist and Address:  [facility], [address] [place], [place] [address]      Provider Number: [id number]  Attending Physician Name and Address:  [name], *  Relative Name and Phone Number:  Daughter Cecille Rubin [phone]    Current Level of Care: Hospital Recommended Level of Care: [place] Prior Approval Number:    Date Approved/Denied:   PASRR Number: [id number]  Discharge Plan: SNF    Current Diagnoses: Patient Active Problem List   Diagnosis Date Noted  . UTI (urinary tract infection) 01/12/2019  . Slurred speech 01/11/2019  . Palliative care patient 01/02/2019  . CVA (cerebral vascular accident) ([place]) 12/17/2018  . Closed left hip fracture ([place]) 11/21/2018  . Pre-diabetes 07/25/2018  . Multiple rib fractures 12/31/2017  . Primary osteoarthritis of right knee 12/14/2017  . Dementia, vascular ([place]) 12/04/2017  . Right shoulder injury 11/12/2015  . Urinary urgency 09/17/2015  . Hyperglycemia 07/29/2015  . Restless leg 06/07/2015  . Benign paroxysmal positional vertigo 06/07/2015  . Hyperlipidemia 04/16/2015  . Vitamin D deficiency 08/13/2014  . T12 compression fracture ([place]) 04/28/2014  . 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 Major depression, recurrent, chronic ([place]) 11/24/2012  . GERD (gastroesophageal reflux disease) 11/24/2012  . Osteoporosis 09/05/2012  . Right hip pain 08/23/2012  . Anxiety 05/04/2012  . Hypertension 02/27/2012  . History of cerebrovascular accident (CVA) with residual deficit 02/25/2012  . Unsteady gait 02/25/2012    Orientation RESPIRATION BLADDER Height & Weight     Self, Time  Normal Incontinent Weight: 65.1 kg Height:  5\' 2"  (157.5 cm)   BEHAVIORAL SYMPTOMS/MOOD NEUROLOGICAL BOWEL NUTRITION STATUS      Incontinent Diet(Heart Health)  AMBULATORY STATUS COMMUNICATION OF NEEDS Skin   Extensive Assist   Normal                       Personal Care Assistance Level of Assistance  Bathing, Feeding, Dressing Bathing Assistance: Limited assistance Feeding assistance: Independent Dressing Assistance: Limited assistance     Functional Limitations Info  Sight, Hearing, Speech Sight Info: Adequate Hearing Info: Adequate Speech Info: Adequate    SPECIAL CARE FACTORS FREQUENCY  PT (By licensed PT), OT (By licensed OT)     PT Frequency: 5 times per week OT Frequency: 5 times per week            Contractures Contractures Info: Present    Additional Factors Info  Code Status, Allergies Code Status Info: DNR Allergies Info: Demorol, Macrobid, PCN           Current Medications (01/13/2019):  This is the current hospital active medication list Current Facility-Administered Medications  Medication Dose Route Frequency Provider Last Rate Last Dose  . 0.9 %  sodium chloride infusion   Intravenous PRN Lance Coon, MD 10 mL/hr at 01/12/19 0237 250 mL at 01/12/19 0237  . acetaminophen (TYLENOL) tablet 650 mg  650 mg Oral Q4H PRN Lance Coon, MD   650 mg at 01/13/19 3149   Or  . acetaminophen (TYLENOL) solution 650 mg  650 mg Per Tube Q4H PRN Lance Coon, MD       Or  . acetaminophen (TYLENOL) suppository 650 mg  650 mg Rectal Q4H PRN Lance Coon, MD      .  aspirin EC tablet 81 mg  81 mg Oral Daily Oralia ManisWillis, David, MD   81 mg at 01/12/19 1050  . atorvastatin (LIPITOR) tablet 40 mg  40 mg Oral q1800 Oralia ManisWillis, David, MD   40 mg at 01/12/19 1706  . cefTRIAXone (ROCEPHIN) 1 g in sodium chloride 0.9 % 100 mL IVPB  1 g Intravenous Q24H Oralia ManisWillis, David, MD 200 mL/hr at 01/13/19 0300 1 g at 01/13/19 0300  . clopidogrel (PLAVIX) tablet 75 mg  75 mg Oral Daily Oralia ManisWillis, David, MD   75 mg at 01/12/19 1050  . enoxaparin (LOVENOX)  injection 40 mg  40 mg Subcutaneous Q24H Oralia ManisWillis, David, MD   40 mg at 01/12/19 0817  . oxyCODONE-acetaminophen (PERCOCET/ROXICET) 5-325 MG per tablet 1 tablet  1 tablet Oral Q6H PRN Arnaldo Nataliamond, Michael S, MD      . pantoprazole (PROTONIX) EC tablet 20 mg  20 mg Oral Daily Oralia ManisWillis, David, MD   20 mg at 01/12/19 1051     Discharge Medications: Please see discharge summary for a list of discharge medications.  Relevant Imaging Results:  Relevant Lab Results:   Additional Information SS # [id number]  Sherren KernsJennifer L Val Farnam, RN

## 2019-01-13 NOTE — Progress Notes (Signed)
Physical Therapy Treatment Patient Details Name: Jill Hancock [place] MRN: [id number] DOB: December 08, 1940 Today'Hancock Date: 01/13/2019    History of Present Illness Pt admitted for slurred speech. MRI negative for CVA. Pt with history of HTN, CVA with residual speech deficits, vascular dementia, anxiety and depression.    PT Comments    Pt in bed, ready for session.  Pt ws able to get to EOB with min assist and good effort.  Once sitting, she is able to sit upright without assist and good upright posture.  She stands to walker x 2 with verbal cues for hand placements and is able to stand for short bouts.  She is unable to march in place or take safe steps sideways along bed.  Walker removed and she transferred with mod assist stand pivot to recliner at bedside.  She was pleased to be up in chair.  Pt know to writer from prior admission.  She seems generally more comfortable and in less L hip pain.  Despite decreased pain, she is unable to walk or transfer safely with walker to recliner.  She does well with stand pivot transfer to recliner which is improved from prior admission.  Transfer with walker should be with +2 assist if attempted.  Pt remains appropriate for SNF despite improvements.  Mobility is generally limited to transfers only with +1-+2 assist.     Follow Up Recommendations  SNF     Equipment Recommendations  Rolling walker with 5" wheels;3in1 (PT)    Recommendations for Other Services       Precautions / Restrictions Precautions Precautions: Fall Restrictions Weight Bearing Restrictions: No LLE Weight Bearing: Partial weight bearing    Mobility  Bed Mobility Overal bed mobility: Needs Assistance Bed Mobility: Supine to Sit     Supine to sit: Min assist Sit to supine: Mod assist   General bed mobility comments: Good effort with minimal pain noted.  Transfers Overall transfer level: Needs assistance Equipment used: Rolling walker (2 wheeled);None Transfers: Sit  to/from RaytheonStand;Stand Pivot Transfers Sit to Stand: Min assist Stand pivot transfers: Mod assist       General transfer comment: Pt able to stand with walker briefly but unable to take effective steps safely.  Ambulation/Gait             General Gait Details: unable   Stairs             Wheelchair Mobility    Modified Rankin (Stroke Patients Only)       Balance Overall balance assessment: Needs assistance Sitting-balance support: Single extremity supported;Feet supported Sitting balance-Leahy Scale: Fair Sitting balance - Comments: able to sit unsupported with good balance and no increased pain or leaning to offload WB on L hip noted as in previous admission. Postural control: Right lateral lean Standing balance support: Bilateral upper extremity supported Standing balance-Leahy Scale: Poor Standing balance comment: Pt able to stand with RW but unable to step to chair.                            Cognition Arousal/Alertness: Awake/alert Behavior During Therapy: WFL for tasks assessed/performed Overall Cognitive Status: History of cognitive impairments - at baseline Area of Impairment: Orientation;Attention;Following commands;Problem solving                 Orientation Level: Time;Disoriented to;Situation     Following Commands: Follows one step commands with increased time;Follows one step commands inconsistently     Problem Solving:  Slow processing;Requires tactile cues;Requires verbal cues General Comments: Pt seems at baseline for cognition - know to writer from previous admission.      Exercises Other Exercises Other Exercises: Pt assisted with grooming EOB. VCs for sequencing, initiation, and termination on this date. OT provided set-up assist for oral care and min assist to maintain seated balance at EOB toward end of task. Other Exercises: Pt educated in bed mobility techniques for sup<>sit transfer as well as strategies for  maintaining seated balance at EOB.    General Comments        Pertinent Vitals/Pain Pain Assessment: Faces Faces Pain Scale: Hurts a little bit Pain Location: Minimal pain today with movement Pain Descriptors / Indicators: Pins and needles;Sharp Pain Intervention([place]): Monitored during session    Home Living Family/patient expects to be discharged to:: Private residence Living Arrangements: Children Available Help at Discharge: Family;Available PRN/intermittently Type of Home: Apartment Home Access: Level entry   Home Layout: One level Home Equipment: Walker - 2 wheels;Shower seat      Prior Function Level of Independence: Needs assistance    ADL'Hancock / Homemaking Assistance Needed: Per chart, pt dtr reports pt requires assist with bathing, dressing, and medication mgt. Recieves meals on wheels. Comments: per pt, she is able to ambulate with RW. Unsure of accurate history as it is difficult to communicate.   PT Goals (current goals can now be found in the care plan section) Acute Rehab PT Goals Patient Stated Goal: To get better Progress towards PT goals: Progressing toward goals    Frequency    Min 2X/week      PT Plan Current plan remains appropriate    Co-evaluation              AM-PAC PT "6 Clicks" Mobility   Outcome Measure  Help needed turning from your back to your side while in a flat bed without using bedrails?: A Little Help needed moving from lying on your back to sitting on the side of a flat bed without using bedrails?: A Little Help needed moving to and from a bed to a chair (including a wheelchair)?: A Lot Help needed standing up from a chair using your arms (e.g., wheelchair or bedside chair)?: A Lot Help needed to walk in hospital room?: Total Help needed climbing 3-5 steps with a railing? : Total 6 Click Score: 12    End of Session Equipment Utilized During Treatment: Gait belt Activity Tolerance: Patient tolerated treatment well Patient  left: in chair;with call bell/phone within reach;with chair alarm set Nurse Communication: Mobility status Pain - Right/Left: Left Pain - part of body: Hip     Time: 0159-0212 PT Time Calculation (min) (ACUTE ONLY): 13 min  Charges:  $Therapeutic Activity: 8-22 mins                     Chesley Noon, PTA 01/13/19, 3:38 PM

## 2019-01-13 NOTE — TOC Progression Note (Addendum)
Transition of Care [facility]) - Progression Note    Patient Details  Name: Jill Hancock MRN: [id number] Date of Birth: 07-06-1940  Transition of [facility]) CM/SW Contact  Elza Rafter, RN Phone Number: 01/13/2019, 2:09 PM  Clinical Narrative:   Spoke with daughter Cecille Rubin to update her on SNF placement.  [facility] has accepted patient.  She is regular [employer]; [facility] is starting insurance authorization-this could take 1-2 days.  Cecille Rubin states they cannot afford the co-pay days of $178.00/day as she was recently discharged from [facility].  Asked care manager assistant to run benefits check.  Patient has $1000.00 left for her deductible.  Per Otila Kluver with [facility]; once she meets the deductible her days will be covered 100%.  Encouraged Cecille Rubin to call and speak with [facility] billing department and verify with them.  Cecille Rubin asks, "what would happen if her mother's LTC insurance wasn't approved and patient was ready for discharge from SNF".  Explained she would need to discharge or pay private for LTC.  Cecille Rubin asks, "what if we refused to take her home; would I get in trouble".   RNCM explained an APS report would made.  Cecille Rubin states she thinks they can pay $1000.00 and will call and speak to billing department at [facility].    Will notify Cecille Rubin when insurance authorization has been determined.  Updated Janci Minor, RNCM [facility] Case Manager.           Expected Discharge Plan and Services                                                 Social Determinants of Health (SDOH) Interventions    Readmission Risk Interventions No flowsheet data found.

## 2019-01-14 LAB — URINE CULTURE: Culture: >=100000 — AB

## 2019-01-14 MED ORDER — CEPHALEXIN 500 MG PO CAPS
500.0000 mg | ORAL_CAPSULE | Freq: Two times a day (BID) | ORAL | Status: DC
  Administered 2019-01-15: 10:00:00 500 mg via ORAL
  Filled 2019-01-14: qty 1

## 2019-01-14 MED ORDER — ACETAMINOPHEN 650 MG RE SUPP: 650.0000 mg | RECTAL | Status: DC | PRN

## 2019-01-14 MED ORDER — ACETAMINOPHEN 325 MG PO TABS
650.0000 mg | ORAL_TABLET | ORAL | Status: DC | PRN
  Administered 2019-01-15 – 2019-01-16 (×5): 650 mg via ORAL
  Filled 2019-01-14 (×5): qty 2

## 2019-01-14 MED ORDER — ACETAMINOPHEN 160 MG/5ML PO SOLN
650.0000 mg | ORAL | Status: DC | PRN
  Filled 2019-01-14: qty 20.3

## 2019-01-14 NOTE — Progress Notes (Signed)
Per daughter Cecille Rubin, patient should not have any "opiods" as it makes her mother "act out", will pass along with primary RN and contact MD to call with an update.

## 2019-01-14 NOTE — Care Management Important Message (Signed)
Important Message  Patient Details  Name: Jill Hancock MRN: [id number] Date of Birth: 1941/03/10   Medicare Important Message Given:  Yes  Initial Medicare IM given by Patient Access Associate on 01/13/2019 at 1513.  Still valid.    [name] 01/14/2019, 8:16 AM

## 2019-01-14 NOTE — Progress Notes (Signed)
[employer] [facility] [place] [facility]   PATIENT NAME: Jill RalphLinda Hancock    MR#:  [id number]  DATE OF BIRTH:  [birth year]  SUBJECTIVE:  CHIEF COMPLAINT:   Chief Complaint  Patient presents with  . Aphasia   Came with slurred speech.  Speech is slightly better today.  Patient does not have focal abnormalities or weakness.  had confusion from opioids in last night and this morning.  REVIEW OF SYSTEMS:  CONSTITUTIONAL: No fever, fatigue or weakness.  EYES: No blurred or double vision.  EARS, NOSE, AND THROAT: No tinnitus or ear pain.  RESPIRATORY: No cough, shortness of breath, wheezing or hemoptysis.  CARDIOVASCULAR: No chest pain, orthopnea, edema.  GASTROINTESTINAL: No nausea, vomiting, diarrhea or abdominal pain.  GENITOURINARY: No dysuria, hematuria.  ENDOCRINE: No polyuria, nocturia,  HEMATOLOGY: No anemia, easy bruising or bleeding SKIN: No rash or lesion. MUSCULOSKELETAL: No joint pain or arthritis.   NEUROLOGIC: No tingling, numbness, weakness.  PSYCHIATRY: No anxiety or depression.   ROS  DRUG ALLERGIES:   Allergies  Allergen Reactions  . Demerol [Meperidine] Nausea And Vomiting  . Macrobid [Nitrofurantoin Macrocrystal] Rash  . Penicillins Rash    Has patient had a PCN reaction causing immediate rash, facial/tongue/throat swelling, SOB or lightheadedness with hypotension: Unknown Has patient had a PCN reaction causing severe rash involving mucus membranes or skin necrosis: Unknown Has patient had a PCN reaction that required hospitalization: Unknown Has patient had a PCN reaction occurring within the last 10 years: No If all of the above answers are "NO", then may proceed with Cephalosporin use.     VITALS:  Blood pressure (!) 134/92, pulse 93, temperature 98.4 F (36.9 C), temperature source Oral, resp. rate 18, height 5\' 2"  (1.575 m), weight 65.1 kg, SpO2 94 %.  PHYSICAL EXAMINATION:  GENERAL:  78 y.o.-year-old patient lying in the bed with no  acute distress.  EYES: Pupils equal, round, reactive to light and accommodation. No scleral icterus. Extraocular muscles intact.  HEENT: Head atraumatic, normocephalic. Oropharynx and nasopharynx clear.  NECK:  Supple, no jugular venous distention. No thyroid enlargement, no tenderness.  LUNGS: Normal breath sounds bilaterally, no wheezing, rales,rhonchi or crepitation. No use of accessory muscles of respiration.  CARDIOVASCULAR: S1, S2 normal. No murmurs, rubs, or gallops.  ABDOMEN: Soft, nontender, nondistended. Bowel sounds present. No organomegaly or mass.  EXTREMITIES: No pedal edema, cyanosis, or clubbing.  NEUROLOGIC: Cranial nerves II through XII are intact. Muscle strength 4/5 in all extremities. Sensation intact. Gait not checked.  Patient speech is still slurred but I can understand it. PSYCHIATRIC: The patient is alert and oriented x 3.  SKIN: No obvious rash, lesion, or ulcer.   Physical Exam LABORATORY PANEL:   CBC Recent Labs  Lab 01/12/19 0600  WBC 8.5  HGB 11.0*  HCT 34.8*  PLT 345   ------------------------------------------------------------------------------------------------------------------  Chemistries  Recent Labs  Lab 01/11/19 2114 01/12/19 0600  NA 139  --   K 3.4*  --   CL 103  --   CO2 26  --   GLUCOSE 123*  --   BUN 10  --   CREATININE 0.69 0.65  CALCIUM 9.1  --   AST 23  --   ALT 17  --   ALKPHOS 83  --   BILITOT 0.2*  --    ------------------------------------------------------------------------------------------------------------------  Cardiac Enzymes No results for input(s): TROPONINI in the last 168 hours. ------------------------------------------------------------------------------------------------------------------  RADIOLOGY:  No results found.  ASSESSMENT AND PLAN:   Principal Problem:  Slurred speech Active Problems:   Hypertension   Anxiety   GERD (gastroesophageal reflux disease)   Hyperlipidemia   UTI  (urinary tract infection)  *  Slurred speech - MRI brain done and did not show any acute stroke.  Patient had some slurring since last stroke and there could be some worsening with this UTI. No further work-up or neurology consult needed. Need to follow with speech as outpatient.  *   UTI (urinary tract infection) -UA resulted after admission and showed nitrites positive.  IV antibiotics started and culture sent.  *  Hypertension -under control now on home medications.  *  Anxiety -was on Ativan at home, currently not getting it and continue to monitor if she gets anxious we can put her back on small dose Ativan.  *  GERD (gastroesophageal reflux disease) -home dose PPI   * Hyperlipidemia -home dose antilipid  Per daughter patient was weak and was not approved for rehab last admission but she did not do well at home could not move much so she is requesting if we can again place her in rehab this time. I spoke to daughter again and she said they would like the patient not to receive much opioid or benzodiazepines.  All the records are reviewed and case discussed with Care Management/Social Workerr. Management plans discussed with the patient, family and they are in agreement.  CODE STATUS: Full  TOTAL TIME TAKING CARE OF THIS PATIENT: 35 minutes.   Spoke to patient's daughter on the phone.  POSSIBLE D/C IN 1-2 DAYS, DEPENDING ON CLINICAL CONDITION.   [name] M.D on 01/14/2019   Between 7am to 6pm - Pager - [phone]  After 6pm go to [email or web address] - password [email or web address] [facility]  Office  [phone]  CC: Primary care physician; Olin Hauser, DO  Note: This dictation was prepared with Dragon dictation along with smaller phrase technology. Any transcriptional errors that result from this process are unintentional.

## 2019-01-15 MED ORDER — METHOCARBAMOL 750 MG PO TABS
750.0000 mg | ORAL_TABLET | Freq: Three times a day (TID) | ORAL | Status: DC
  Administered 2019-01-15 – 2019-01-16 (×2): 750 mg via ORAL
  Filled 2019-01-15 (×4): qty 1

## 2019-01-15 MED ORDER — TRAZODONE HCL 100 MG PO TABS
100.0000 mg | ORAL_TABLET | Freq: Every day | ORAL | Status: DC
  Administered 2019-01-15: 100 mg via ORAL
  Filled 2019-01-15: qty 1

## 2019-01-15 MED ORDER — FLUOXETINE HCL 20 MG PO CAPS
40.0000 mg | ORAL_CAPSULE | Freq: Every day | ORAL | Status: DC
  Administered 2019-01-15 – 2019-01-16 (×2): 40 mg via ORAL
  Filled 2019-01-15 (×2): qty 2

## 2019-01-15 MED ORDER — LISINOPRIL 20 MG PO TABS
40.0000 mg | ORAL_TABLET | Freq: Every day | ORAL | Status: DC
  Administered 2019-01-15 – 2019-01-16 (×2): 40 mg via ORAL
  Filled 2019-01-15 (×2): qty 2

## 2019-01-15 MED ORDER — CEPHALEXIN 250 MG PO CAPS
250.0000 mg | ORAL_CAPSULE | Freq: Four times a day (QID) | ORAL | Status: DC
  Administered 2019-01-15 – 2019-01-16 (×3): 250 mg via ORAL
  Filled 2019-01-15 (×5): qty 1

## 2019-01-15 NOTE — Progress Notes (Signed)
PHARMACY NOTE:  ANTIMICROBIAL RENAL DOSAGE ADJUSTMENT  Current antimicrobial regimen includes a mismatch between antimicrobial dosage and estimated renal function.  As per policy approved by the Pharmacy & Therapeutics and Medical Executive Committees, the antimicrobial dosage will be adjusted accordingly.  Current antimicrobial dosage:  Keflex 500 mg q12H  Indication: UTI  Renal Function: Estimated Creatinine Clearance: 51.3 mL/min (by C-G formula based on SCr of 0.65 mg/dL).    Antimicrobial dosage has been changed to:  Keflex 250 mg q6H  Additional comments: Keflex is a time dependent abx - we want to dose it more frequently. Day 4 of abx. Pt only needs one more day of abx.    Thank you for allowing pharmacy to be a part of this patient's care.  Oswald Hillock, [facility] 01/15/2019 1:00 PM

## 2019-01-15 NOTE — Progress Notes (Signed)
[employer] [facility] - [place] [facility]   PATIENT NAME: Jill RalphLinda Hancock    MR#:  [id number]  DATE OF BIRTH:  05/30/1941  SUBJECTIVE:  CHIEF COMPLAINT:   Chief Complaint  Patient presents with  . Aphasia   Came with slurred speech.  Speech is slightly better today.  Patient does not have focal abnormalities or weakness. She complains of pain in her right hip and knee which is chronic complaint for her.  REVIEW OF SYSTEMS:  CONSTITUTIONAL: No fever, fatigue or weakness.  EYES: No blurred or double vision.  EARS, NOSE, AND THROAT: No tinnitus or ear pain.  RESPIRATORY: No cough, shortness of breath, wheezing or hemoptysis.  CARDIOVASCULAR: No chest pain, orthopnea, edema.  GASTROINTESTINAL: No nausea, vomiting, diarrhea or abdominal pain.  GENITOURINARY: No dysuria, hematuria.  ENDOCRINE: No polyuria, nocturia,  HEMATOLOGY: No anemia, easy bruising or bleeding SKIN: No rash or lesion. MUSCULOSKELETAL: No joint pain or arthritis.   NEUROLOGIC: No tingling, numbness, weakness.  PSYCHIATRY: No anxiety or depression.   ROS  DRUG ALLERGIES:   Allergies  Allergen Reactions  . Demerol [Meperidine] Nausea And Vomiting  . Macrobid [Nitrofurantoin Macrocrystal] Rash  . Penicillins Rash    Has patient had a PCN reaction causing immediate rash, facial/tongue/throat swelling, SOB or lightheadedness with hypotension: Unknown Has patient had a PCN reaction causing severe rash involving mucus membranes or skin necrosis: Unknown Has patient had a PCN reaction that required hospitalization: Unknown Has patient had a PCN reaction occurring within the last 10 years: No If all of the above answers are "NO", then may proceed with Cephalosporin use.     VITALS:  Blood pressure 140/87, pulse (!) 108, temperature 97.7 F (36.5 C), temperature source Oral, resp. rate 19, height 5\' 2"  (1.575 m), weight 65.1 kg, SpO2 94 %.  PHYSICAL EXAMINATION:  GENERAL:  78 y.o.-year-old patient  lying in the bed with no acute distress.  EYES: Pupils equal, round, reactive to light and accommodation. No scleral icterus. Extraocular muscles intact.  HEENT: Head atraumatic, normocephalic. Oropharynx and nasopharynx clear.  NECK:  Supple, no jugular venous distention. No thyroid enlargement, no tenderness.  LUNGS: Normal breath sounds bilaterally, no wheezing, rales,rhonchi or crepitation. No use of accessory muscles of respiration.  CARDIOVASCULAR: S1, S2 normal. No murmurs, rubs, or gallops.  ABDOMEN: Soft, nontender, nondistended. Bowel sounds present. No organomegaly or mass.  EXTREMITIES: No pedal edema, cyanosis, or clubbing.  NEUROLOGIC: Cranial nerves II through XII are intact. Muscle strength 4/5 in all extremities. Sensation intact. Gait not checked.  Patient speech is still slurred but I can understand it. PSYCHIATRIC: The patient is alert and oriented x 3.  SKIN: No obvious rash, lesion, or ulcer.   Physical Exam LABORATORY PANEL:   CBC Recent Labs  Lab 01/12/19 0600  WBC 8.5  HGB 11.0*  HCT 34.8*  PLT 345   ------------------------------------------------------------------------------------------------------------------  Chemistries  Recent Labs  Lab 01/11/19 2114 01/12/19 0600  NA 139  --   K 3.4*  --   CL 103  --   CO2 26  --   GLUCOSE 123*  --   BUN 10  --   CREATININE 0.69 0.65  CALCIUM 9.1  --   AST 23  --   ALT 17  --   ALKPHOS 83  --   BILITOT 0.2*  --    ------------------------------------------------------------------------------------------------------------------  Cardiac Enzymes No results for input(s): TROPONINI in the last 168 hours. ------------------------------------------------------------------------------------------------------------------  RADIOLOGY:  No results found.  ASSESSMENT AND  PLAN:   Principal Problem:   Slurred speech Active Problems:   Hypertension   Anxiety   GERD (gastroesophageal reflux disease)    Hyperlipidemia   UTI (urinary tract infection)  *  Slurred speech - MRI brain done and did not show any acute stroke.  Patient had some slurring since last stroke and there could be some worsening with this UTI. No further work-up or neurology consult needed. Need to follow with speech as outpatient.  *   UTI (urinary tract infection) -UA resulted after admission and showed nitrites positive.  IV antibiotics started and culture sent. E. coli sensitive to ceftriaxone.  We would give total 5 days of antibiotic therapy. (Darted on 01/11/19)  *  Hypertension -under control now on home medications.  *  Anxiety -was on Ativan at home, currently not getting it and continue to monitor if she gets anxious we can put her back on small dose Ativan.  *  GERD (gastroesophageal reflux disease) -home dose PPI   * Hyperlipidemia -home dose antilipid  Per daughter patient was weak and was not approved for rehab last admission but she did not do well at home could not move much so she is requesting if we can again place her in rehab this time. I spoke to daughter again and she said they would like the patient not to receive much opioid or benzodiazepines. Patient is approved for rehab and discharge is likely tomorrow.  All the records are reviewed and case discussed with Care Management/Social Workerr. Management plans discussed with the patient, family and they are in agreement.  CODE STATUS: Full  TOTAL TIME TAKING CARE OF THIS PATIENT: 35 minutes.   Spoke to patient's daughter on the phone.  POSSIBLE D/C IN 1-2 DAYS, DEPENDING ON CLINICAL CONDITION.   [name] M.D on 01/15/2019   Between 7am to 6pm - Pager - [phone]  After 6pm go to [email or web address] - password [email or web address] [facility]  Office  [phone]  CC: Primary care physician; Olin Hauser, DO  Note: This dictation was prepared with Dragon dictation along with smaller phrase technology.  Any transcriptional errors that result from this process are unintentional.

## 2019-01-15 NOTE — Progress Notes (Signed)
Physical Therapy Treatment Patient Details Name: Jill CockerLinda S Hancock MRN: [id number] DOB: 05-26-1941 Today's Date: 01/15/2019    History of Present Illness Pt admitted for slurred speech. MRI negative for CVA. Pt with history of HTN, CVA with residual speech deficits, vascular dementia, anxiety and depression.    PT Comments    Pt has scooted to edge of bed and was sitting with feet on floor between bottom rail and footboard.  When asked pt stated she wanted to get up.  Bed alarm was on but not triggered.  Pt was able to stand to walker with min assist and take several small sidesteps along bed before sitting.  After short rest, she was able to transfer to recliner with walker and min/mod a x 1.  Pt generally seemed anxious today which is not typical of her.  Called nurse tech and she confirmed pt was unsettled today and she had recently put her back to bed.  Chair alarm on.  Walked out of room to check on pt across the hallway.  Upon leaving opposite room, pt had slid down in recliner.  Went in and she stated she wanted back to bed.  Pt asked if she was in pain or needed to use the bathroom and she denied both.  Stand pivot transfer back to bed with mod a x 1.  Settled to comfort and bed alarm but back on at a more sensitive level to alert staff of attempt at OOB without assist.  HR 108.    Follow Up Recommendations  SNF     Equipment Recommendations  Rolling walker with 5" wheels;3in1 (PT)    Recommendations for Other Services       Precautions / Restrictions Precautions Precautions: Fall Restrictions Weight Bearing Restrictions: No LLE Weight Bearing: Partial weight bearing    Mobility  Bed Mobility Overal bed mobility: Needs Assistance Bed Mobility: Supine to Sit;Sit to Supine     Supine to sit: Min assist Sit to supine: Min assist      Transfers Overall transfer level: Needs assistance Equipment used: Rolling walker (2 wheeled);None Transfers: Sit to/from W. R. BerkleyStand;Stand  Pivot Transfers   Stand pivot transfers: Min assist          Ambulation/Gait Ambulation/Gait assistance: Min assist;Mod assist Gait Distance (Feet): 3 Feet Assistive device: Rolling walker (2 wheeled) Gait Pattern/deviations: Step-to pattern;Narrow base of support Gait velocity: decreased   General Gait Details: able to sidestep along bed and then transfer to recliner.   Stairs             Wheelchair Mobility    Modified Rankin (Stroke Patients Only)       Balance Overall balance assessment: Needs assistance Sitting-balance support: Feet supported Sitting balance-Leahy Scale: Fair     Standing balance support: Bilateral upper extremity supported Standing balance-Leahy Scale: Poor                              Cognition Arousal/Alertness: Awake/alert Behavior During Therapy: Anxious Overall Cognitive Status: History of cognitive impairments - at baseline                                 General Comments: generally anxious today "I can't stop shaking"      Exercises      General Comments        Pertinent Vitals/Pain Pain Assessment: Faces Faces Pain Scale: Hurts a  little bit Pain Location: Minimal pain today with movement Pain Intervention(s): Monitored during session    Home Living                      Prior Function            PT Goals (current goals can now be found in the care plan section) Progress towards PT goals: Progressing toward goals    Frequency    Min 2X/week      PT Plan Current plan remains appropriate    Co-evaluation              AM-PAC PT "6 Clicks" Mobility   Outcome Measure  Help needed turning from your back to your side while in a flat bed without using bedrails?: A Little Help needed moving from lying on your back to sitting on the side of a flat bed without using bedrails?: A Little Help needed moving to and from a bed to a chair (including a wheelchair)?: A  Lot Help needed standing up from a chair using your arms (e.g., wheelchair or bedside chair)?: A Lot Help needed to walk in hospital room?: Total Help needed climbing 3-5 steps with a railing? : Total 6 Click Score: 12    End of Session Equipment Utilized During Treatment: Gait belt Activity Tolerance: Other (comment) Patient left: in bed;with call bell/phone within reach;with bed alarm set Nurse Communication: Other (comment) Pain - Right/Left: Left Pain - part of body: Hip     Time: 3474-2595 PT Time Calculation (min) (ACUTE ONLY): 18 min  Charges:  $Therapeutic Activity: 8-22 mins                    Chesley Noon, PTA 01/15/19, 3:07 PM

## 2019-01-15 NOTE — Progress Notes (Signed)
Occupational Therapy Treatment Patient Details Name: Jill Hancock MRN: [id number] DOB: December 06, 1940 Today's Date: 01/15/2019    History of present illness Pt admitted for slurred speech. MRI negative for CVA. Pt with history of HTN, CVA with residual speech deficits, vascular dementia, anxiety and depression.   OT comments  Pt tolerates treatment well this date. Appears slightly more anxious requiring MIN/MOD verbal cues to wait for assistance with fxl transfers. OT engages pt in LB dressing education training with AE including sock aid and reacher to don/doff socks while seated EOB. Pt requires MIN A to get to EOB as well as MIN verbal cues to sequence. While seated using sock aid, requires MIN/MOD verbal/visual cues including demo to utilize sock aid and reacher correctly as well as sequence steps of task. In addition, OT engages pt in fxl transfer training to improve safety and tolerance with sit<>stand from EOB. Pt requires MIN verbal/tactile cues for safe hand/foot placement relative to FWW as well as cues for safety while in standing. Pt demos 75% understanding of cueing and tolerates 10-15 seconds per stand on average. Pt requires MIN A to perform sit to sup transition and MOD A to reposition in bed for optimal comfort. Pt left with call bell in reach and bed alarm activated. D/c rec's remain appropriate.    Follow Up Recommendations  SNF    Equipment Recommendations  Other (comment)(TBD for next venue of care)    Recommendations for Other Services      Precautions / Restrictions Precautions Precautions: Fall Restrictions Weight Bearing Restrictions: Yes LLE Weight Bearing: Partial weight bearing       Mobility Bed Mobility Overal bed mobility: Needs Assistance Bed Mobility: Supine to Sit;Sit to Supine     Supine to sit: Min assist Sit to supine: Min assist      Transfers Overall transfer level: Needs assistance Equipment used: Rolling walker (2  wheeled);None Transfers: Sit to/from Stand Sit to Stand: Min assist Stand pivot transfers: Min assist       General transfer comment: Pt tolerates ~10-15 secs per stand on 4 trials. Initially requires MIN/MOD, but then performs subsequent 3 stands with CGA-MIN A.    Balance Overall balance assessment: Needs assistance Sitting-balance support: Feet supported Sitting balance-Leahy Scale: Good   Postural control: Right lateral lean Standing balance support: Bilateral upper extremity supported Standing balance-Leahy Scale: Poor Standing balance comment: Pt able to stand with FWW but unable to weight shift for steps                           ADL either performed or assessed with clinical judgement   ADL Overall ADL's : Needs assistance/impaired                     Lower Body Dressing: Moderate assistance;With adaptive equipment Lower Body Dressing Details (indicate cue type and reason): seated EOB donning/doffing socks using sock aid and reacher.                     Vision Baseline Vision/History: No visual deficits Patient Visual Report: No change from baseline     Perception     Praxis      Cognition Arousal/Alertness: Awake/alert Behavior During Therapy: Anxious Overall Cognitive Status: History of cognitive impairments - at baseline Area of Impairment: Orientation;Attention;Following commands;Problem solving                 Orientation Level: Time;Disoriented to;Situation  Following Commands: Follows one step commands with increased time;Follows one step commands inconsistently     Problem Solving: Slow processing;Requires tactile cues;Requires verbal cues General Comments: Pt appeared anxious this date, asks periodically "now what are we going to do?" and attempts to stand a few times without cues from OT and without OT ready to provide assitance for safety. Verbal cues required to take seated rest break and wait for assitance.         Exercises Other Exercises Other Exercises: OT provides education including visual demonstration of use of sock aide/reacher for LB dressing   Shoulder Instructions       General Comments      Pertinent Vitals/ Pain       Pain Assessment: Faces Faces Pain Scale: Hurts a little bit Pain Location: minimal grimmacing detected with bed mobility/sit<>stand Pain Descriptors / Indicators: Sharp Pain Intervention(s): Limited activity within patient's tolerance;Monitored during session  Home Living                                          Prior Functioning/Environment              Frequency  Min 2X/week        Progress Toward Goals  OT Goals(current goals can now be found in the care plan section)  Progress towards OT goals: Progressing toward goals  Acute Rehab OT Goals Patient Stated Goal: To get better OT Goal Formulation: With patient Time For Goal Achievement: 01/27/19 Potential to Achieve Goals: Good  Plan Discharge plan remains appropriate    Co-evaluation                 AM-PAC OT "6 Clicks" Daily Activity     Outcome Measure   Help from another person eating meals?: A Little Help from another person taking care of personal grooming?: A Little Help from another person toileting, which includes using toliet, bedpan, or urinal?: A Lot Help from another person bathing (including washing, rinsing, drying)?: A Lot Help from another person to put on and taking off regular upper body clothing?: A Little Help from another person to put on and taking off regular lower body clothing?: A Lot 6 Click Score: 15    End of Session Equipment Utilized During Treatment: Gait belt;Rolling walker  OT Visit Diagnosis: Unsteadiness on feet (R26.81);Muscle weakness (generalized) (M62.81);History of falling (Z91.81);Pain Pain - Right/Left: Right(both) Pain - part of body: Knee;Hip;Leg   Activity Tolerance Patient tolerated treatment well    Patient Left in bed;with call bell/phone within reach;with bed alarm set   Nurse Communication Other (comment)(OT informs CNA that pt's external catheter might need to be adjusted/replaced after mobilization.)        Time: [id number] OT Time Calculation (min): 49 min  Charges: OT General Charges $OT Visit: 1 Visit OT Treatments $Self Care/Home Management : 23-37 mins $Therapeutic Activity: 8-22 mins  Rejeana Brocklison Jill Silos, MS, OTR/L ascom [phone] or [phone] 01/15/19, 4:07 PM

## 2019-01-15 NOTE — TOC Progression Note (Signed)
Transition of Care [facility]) - Progression Note    Patient Details  Name: Jill Hancock MRN: [id number] Date of Birth: 10/13/1940  Transition of Care [facility]) CM/SW Contact  Elza Rafter, RN Phone Number: 01/15/2019, 12:05 PM  Clinical Narrative:   Damaris Schooner with [facility]illing and informed her that [facility] will be able to accept her tomorrow if medically ready.  MD also aware.          Expected Discharge Plan and Services                                                 Social Determinants of Health (SDOH) Interventions    Readmission Risk Interventions No flowsheet data found.

## 2019-01-16 ENCOUNTER — Telehealth: Payer: Medicare HMO

## 2019-01-16 ENCOUNTER — Ambulatory Visit: Payer: Medicare HMO | Admitting: Licensed Clinical Social Worker

## 2019-01-16 DIAGNOSIS — R4781 Slurred speech: Secondary | ICD-10-CM | POA: Diagnosis not present

## 2019-01-16 DIAGNOSIS — F329 Major depressive disorder, single episode, unspecified: Secondary | ICD-10-CM | POA: Diagnosis not present

## 2019-01-16 DIAGNOSIS — F0151 Vascular dementia with behavioral disturbance: Secondary | ICD-10-CM

## 2019-01-16 DIAGNOSIS — E785 Hyperlipidemia, unspecified: Secondary | ICD-10-CM | POA: Diagnosis not present

## 2019-01-16 DIAGNOSIS — R410 Disorientation, unspecified: Secondary | ICD-10-CM | POA: Diagnosis not present

## 2019-01-16 DIAGNOSIS — M25552 Pain in left hip: Secondary | ICD-10-CM | POA: Diagnosis not present

## 2019-01-16 DIAGNOSIS — Z7401 Bed confinement status: Secondary | ICD-10-CM | POA: Diagnosis not present

## 2019-01-16 DIAGNOSIS — M2550 Pain in unspecified joint: Secondary | ICD-10-CM | POA: Diagnosis not present

## 2019-01-16 DIAGNOSIS — F01518 Vascular dementia, unspecified severity, with other behavioral disturbance: Secondary | ICD-10-CM

## 2019-01-16 DIAGNOSIS — F419 Anxiety disorder, unspecified: Secondary | ICD-10-CM

## 2019-01-16 DIAGNOSIS — K219 Gastro-esophageal reflux disease without esophagitis: Secondary | ICD-10-CM | POA: Diagnosis not present

## 2019-01-16 DIAGNOSIS — I10 Essential (primary) hypertension: Secondary | ICD-10-CM | POA: Diagnosis not present

## 2019-01-16 DIAGNOSIS — R319 Hematuria, unspecified: Secondary | ICD-10-CM | POA: Diagnosis not present

## 2019-01-16 DIAGNOSIS — Z515 Encounter for palliative care: Secondary | ICD-10-CM | POA: Diagnosis not present

## 2019-01-16 DIAGNOSIS — R6889 Other general symptoms and signs: Secondary | ICD-10-CM | POA: Diagnosis not present

## 2019-01-16 DIAGNOSIS — R8290 Unspecified abnormal findings in urine: Secondary | ICD-10-CM | POA: Diagnosis not present

## 2019-01-16 DIAGNOSIS — I2510 Atherosclerotic heart disease of native coronary artery without angina pectoris: Secondary | ICD-10-CM | POA: Diagnosis not present

## 2019-01-16 DIAGNOSIS — D649 Anemia, unspecified: Secondary | ICD-10-CM | POA: Diagnosis not present

## 2019-01-16 DIAGNOSIS — R404 Transient alteration of awareness: Secondary | ICD-10-CM | POA: Diagnosis not present

## 2019-01-16 DIAGNOSIS — N390 Urinary tract infection, site not specified: Secondary | ICD-10-CM | POA: Diagnosis not present

## 2019-01-16 DIAGNOSIS — I6350 Cerebral infarction due to unspecified occlusion or stenosis of unspecified cerebral artery: Secondary | ICD-10-CM | POA: Diagnosis not present

## 2019-01-16 DIAGNOSIS — R2681 Unsteadiness on feet: Secondary | ICD-10-CM | POA: Diagnosis not present

## 2019-01-16 DIAGNOSIS — M6281 Muscle weakness (generalized): Secondary | ICD-10-CM | POA: Diagnosis not present

## 2019-01-16 DIAGNOSIS — F411 Generalized anxiety disorder: Secondary | ICD-10-CM | POA: Diagnosis not present

## 2019-01-16 LAB — SARS CORONAVIRUS 2 BY RT PCR (HOSPITAL ORDER, PERFORMED IN ~~LOC~~ HOSPITAL LAB): SARS Coronavirus 2: NEGATIVE

## 2019-01-16 MED ORDER — TRAZODONE HCL 100 MG PO TABS: 100.0000 mg | ORAL_TABLET | Freq: Every day | ORAL | 0 refills | Status: DC

## 2019-01-16 MED ORDER — LORAZEPAM 0.5 MG PO TABS: 0.5000 mg | ORAL_TABLET | Freq: Three times a day (TID) | ORAL | 0 refills | Status: DC | PRN

## 2019-01-16 MED ORDER — MELATONIN 5 MG PO CAPS: 1.0000 | ORAL_CAPSULE | Freq: Every day | ORAL | 0 refills | Status: DC

## 2019-01-16 MED ORDER — LORAZEPAM 0.5 MG PO TABS: 0.5000 mg | ORAL_TABLET | Freq: Four times a day (QID) | ORAL | 0 refills | Status: DC | PRN

## 2019-01-16 MED ORDER — CEPHALEXIN 250 MG PO CAPS: 250.0000 mg | ORAL_CAPSULE | Freq: Four times a day (QID) | ORAL | 0 refills | Status: AC

## 2019-01-16 MED ORDER — ACETAMINOPHEN 325 MG PO TABS: 650.0000 mg | ORAL_TABLET | ORAL | Status: DC | PRN

## 2019-01-16 NOTE — Progress Notes (Signed)
Advanced care plan.  Purpose of the Encounter: CODE STATUS  Parties in Attendance: Patient's daughter over the phone Jill Hancock  Patient's Decision Capacity: Limited  Subjective/Patient's story: LindaHiltonenis a54 y.o.femalewho presents with chief complaint as above. Patient brought to the ED for worsening speech problems. She has prior history of stroke, and states that she has had slurred speech and speech difficulty for the past year. However, family states that her speech problems on the day of admission are much worse than her baseline.  Patient was evaluated in the emergency room and further work-up for stroke was negative.  Including MRI of the brain.  She was noted to have a UTI which was due to E. coli.  Patient was previously DNR 2 times.  She was written as to be full code here   Objective/Medical story I discussed with the patient's daughter to make sure that patient was DNR. Daughter states that she does not want her mom to be resuscitated or intubated  Goals of care determination:  DNR   CODE STATUS: DNR   Time spent discussing advanced care planning: 16 minutes

## 2019-01-16 NOTE — Progress Notes (Signed)
Called report to [facility] - [place].  Also spoke with daughter Theophilus Kinds to make sure she knew about the discharge.

## 2019-01-16 NOTE — Progress Notes (Signed)
Discharged via EMS to [facility]

## 2019-01-16 NOTE — Discharge Summary (Addendum)
[facility] - [place] at [facility]  Jill Hancock, 78 y.o., DOB May 24, 1941, MRN [id number]. Admission date: 01/11/2019 Discharge Date 01/16/2019 Primary MD Smitty CordsKaramalegos, Alexander J, DO Admitting Physician Oralia Manisavid Willis, MD  Admission Diagnosis  Cerebrovascular accident (CVA), unspecified mechanism [facility](HCC) [I63.9]  Discharge Diagnosis   Principal Problem: Slurred speech with no evidence of new stroke possibly due to UTI UTI due to E. coli Essential hypertension Generalized anxiety GERD Hyperlipidemia  Hospital Course  Jill RalphLinda Hancock  is a 78 y.o. female who presents with chief complaint as above.  Patient brought to the ED for worsening speech problems.  She has prior history of stroke, and states that she has had slurred speech and speech difficulty for the past year.  However, family states that her speech problems on the day of admission are much worse than her baseline.  Patient was evaluated in the emergency room and further work-up for stroke was negative.  Including MRI of the brain.  She was noted to have a UTI which was due to E. coli.  She was seen by PT recommended rehab.  Patient is arranged to go to rehab.   Please note daughter states that she wants to limit patient to have any opioids Also she requests that patient get melatonin 5 mg at bedtime with trazodone 100 mg at bedtime as well as 0.5 mg of Ativan at bedtime to help her rest.         Consults  None  Significant Tests:  See full reports for all details     Ct Head Wo Contrast  Result Date: 12/17/2018 CLINICAL DATA:  History of stroke EXAM: CT HEAD WITHOUT CONTRAST TECHNIQUE: Contiguous axial images were obtained from the base of the skull through the vertex without intravenous contrast. COMPARISON:  11/22/2018 FINDINGS: Brain: No evidence of acute infarction, hemorrhage, hydrocephalus, extra-axial collection or mass lesion/mass effect. There is extensive, confluent periventricular and deep white  matter hypodensity of the bilateral cerebral hemispheres Vascular: No hyperdense vessel or unexpected calcification. Skull: Normal. Negative for fracture or focal lesion. Sinuses/Orbits: No acute finding. Other: None. IMPRESSION: No acute intracranial pathology. There is extensive, confluent periventricular and deep white matter hypodensity of the bilateral cerebral hemispheres, consistent with advanced small-vessel white matter disease and prior infarctions. Consider MRI to more sensitively evaluate for acutely superimposed diffusion restricting infarction in this background. Electronically Signed   By: Lauralyn PrimesAlex  Bibbey M.D.   On: 12/17/2018 14:48   Mr Brain Wo Contrast  Result Date: 01/12/2019 CLINICAL DATA:  Focal neuro deficit, less than 6 hours. Progressive speech abnormality. Patient has had slurred speech for 1 year with progression today. EXAM: MRI HEAD WITHOUT CONTRAST TECHNIQUE: Multiplanar, multiecho pulse sequences of the brain and surrounding structures were obtained without intravenous contrast. COMPARISON:  CT head without contrast 01/11/2019. MRI brain 12/17/2018 FINDINGS: Brain: The diffusion-weighted images demonstrate no acute or subacute infarction. Advanced atrophy and diffuse confluent T2 hyperintensities bilaterally are stable. 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 Remote lacunar infarcts in the left corona radiata are stable. Dilated perivascular spaces are present in the basal ganglia. White matter changes extend into the brainstem. Multiple scattered foci of susceptibility are present throughout both hemispheres. These are concentrated in the basal ganglia and thalami. There is a prominent focus of susceptibility in the left paramedian pons. Foci are scattered throughout the cerebellum. No acute hemorrhage is present. Vascular: Flow is present in the major intracranial arteries. Skull and upper cervical spine: The craniocervical junction is normal.  Upper cervical spine is within  normal limits. Marrow signal is unremarkable. Sinuses/Orbits: The paranasal sinuses and mastoid air cells are clear. Bilateral lens replacements are noted. Globes and orbits are otherwise unremarkable. IMPRESSION: 1. No acute intracranial abnormality or significant interval change. 2. Stable appearance of advanced atrophy and diffuse white matter disease, likely reflecting the sequela of chronic microvascular ischemia. 3. 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 4. Multiple foci of susceptibility throughout both hemispheres, cerebellum, and left paramedian brainstem. This is most consistent with remote hemorrhage in suggests an underlying vasculopathy. This may be related to hypertension or amyloid angiopathy. Electronically Signed   By: Marin Robertshristopher  Mattern M.D.   On: 01/12/2019 10:46   Mr Brain Wo Contrast  Result Date: 12/17/2018 CLINICAL DATA:  Right lower extremity weakness EXAM: MRI HEAD WITHOUT CONTRAST TECHNIQUE: Multiplanar, multiecho pulse sequences of the brain and surrounding structures were obtained without intravenous contrast. COMPARISON:  Head CT 12/17/2018 FINDINGS: BRAIN: Punctate focus of abnormal diffusion restriction within the left pons. There is also blooming in this area on the T2-weighted gradient echo sequence. No other diffusion abnormality. The midline structures are normal. There are old infarcts of the right cerebellum, right pons and both deep gray nuclei. Diffuse confluent hyperintense T2-weighted signal within the periventricular, deep and juxtacortical white matter, most commonly due to chronic ischemic microangiopathy. The cerebral and cerebellar volume are age-appropriate. No hydrocephalus. Multiple chronic microhemmorhages in a predominantly central distribution. No mass lesion. VASCULAR: The major intracranial arterial and venous sinus flow voids are normal. SKULL AND UPPER CERVICAL SPINE: Calvarial bone marrow signal is normal.  There is no skull base mass. Visualized upper cervical spine and soft tissues are normal. SINUSES/ORBITS: No fluid levels or advanced mucosal thickening. No mastoid or middle ear effusion. The orbits are normal. IMPRESSION: 1. Punctate focus of abnormal diffusion restriction within the left pons with associated magnetic susceptibility effect on T2*-weighted imaging. This could represent a small acute infarct, as this location is in keeping with the reported right lower extremity weakness. However this could also be a susceptibility artifact due to chronic blood products. 2. 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 3. Numerous chronic microhemorrhages, predominantly central, most consistent with chronic hypertensive angiopathy. Electronically Signed   By: Deatra RobinsonKevin  Herman M.D.   On: 12/17/2018 17:16   [place] Carotid Bilateral (at [facility] Only)  Result Date: 01/12/2019 CLINICAL DATA:  Slurred speech.  History of hypertension. EXAM: BILATERAL CAROTID DUPLEX ULTRASOUND TECHNIQUE: Wallace CullensGray scale imaging, color Doppler and duplex ultrasound were performed of bilateral carotid and vertebral arteries in the neck. COMPARISON:  None. FINDINGS: Criteria: Quantification of carotid stenosis is based on velocity parameters that correlate the residual internal carotid diameter with NASCET-based stenosis levels, using the diameter of the distal internal carotid lumen as the denominator for stenosis measurement. The following velocity measurements were obtained: RIGHT ICA: 42/14 cm/sec CCA: 63/14 cm/sec SYSTOLIC ICA/CCA RATIO:  0.7 ECA: 196 cm/sec LEFT ICA: 53/8 cm/sec CCA: 63/11 cm/sec SYSTOLIC ICA/CCA RATIO:  0.8 ECA: 124 cm/sec RIGHT CAROTID ARTERY: There is a minimal amount of eccentric echogenic plaque within the right carotid bulb (image 16), not resulting in elevated peak systolic velocities within the interrogated course of right internal carotid artery to suggest a hemodynamically  significant stenosis. RIGHT VERTEBRAL ARTERY:  Antegrade Flow LEFT CAROTID ARTERY: There is a minimal amount of eccentric echogenic plaque within the left carotid bulb (image 49), extending to involve the origin and proximal aspect  the left internal carotid artery (image 56), not resulting in elevated peak systolic velocities within the interrogated course of the left internal carotid artery to suggest a hemodynamically significant stenosis. LEFT VERTEBRAL ARTERY:  Antegrade flow IMPRESSION: Minimal amount of bilateral atherosclerotic plaque, left greater than right, not resulting in a hemodynamically significant stenosis within either internal carotid artery. Electronically Signed   By: Simonne ComeJohn  Watts M.D.   On: 01/12/2019 11:07   [place] Carotid Bilateral (at [facility])  Result Date: 12/18/2018 CLINICAL DATA:  Hypertension, syncope, hyperlipidemia EXAM: BILATERAL CAROTID DUPLEX ULTRASOUND TECHNIQUE: Wallace CullensGray scale imaging, color Doppler and duplex ultrasound were performed of bilateral carotid and vertebral arteries in the neck. COMPARISON:  None. FINDINGS: Criteria: Quantification of carotid stenosis is based on velocity parameters that correlate the residual internal carotid diameter with NASCET-based stenosis levels, using the diameter of the distal internal carotid lumen as the denominator for stenosis measurement. The following velocity measurements were obtained: RIGHT ICA: 47/14 cm/sec CCA: 45/10 cm/sec SYSTOLIC ICA/CCA RATIO:  1.0 ECA: 109 cm/sec LEFT ICA: 57/19 cm/sec CCA: 68/13 cm/sec SYSTOLIC ICA/CCA RATIO:  0.8 ECA: 71 cm/sec RIGHT CAROTID ARTERY: Minor echogenic shadowing plaque formation. No hemodynamically significant right ICA stenosis, velocity elevation, or turbulent flow. Degree of narrowing less than 50%. RIGHT VERTEBRAL ARTERY:  Antegrade LEFT CAROTID ARTERY: Similar scattered minor echogenic plaque formation. No hemodynamically significant left ICA stenosis, velocity elevation, or turbulent  flow. LEFT VERTEBRAL ARTERY:  Antegrade IMPRESSION: Minor carotid atherosclerosis. No hemodynamically significant ICA stenosis. Degree of narrowing less than 50% bilaterally by ultrasound criteria. Patent antegrade vertebral flow bilaterally Electronically Signed   By: Judie PetitM.  Shick M.D.   On: 12/18/2018 11:07   Dg Chest Portable 1 View  Result Date: 12/17/2018 CLINICAL DATA:  Pain EXAM: PORTABLE CHEST 1 VIEW COMPARISON:  11/21/2018 FINDINGS: The heart size is stable. No pneumothorax. No large pleural effusion. There is likely some atelectasis versus scarring at the lung bases. No acute osseous abnormality. IMPRESSION: No active disease. Electronically Signed   By: Katherine Mantlehristopher  Green M.D.   On: 12/17/2018 15:08   Dg Hip Unilat W Or Wo Pelvis 2-3 Views Left  Result Date: 12/17/2018 CLINICAL DATA:  The patient suffered a left intertrochanteric fracture in a fall 11/21/2018 and underwent fixation that same day. Left hip pain today. No new injury. Subsequent encounter. EXAM: DG HIP (WITH OR WITHOUT PELVIS) 2-3V LEFT COMPARISON:  Plain films left hip 11/21/2018. FINDINGS: Hip screw and short intramedullary nail are in place for fixation of a left intertrochanteric fracture. Hardware is intact. Position and alignment are unchanged. A cortical lucency is seen in the anterior and lateral cortex of the proximal diaphysis of the femur consistent with a periprosthetic fracture along the midportion of the intramedullary nail. This is not visible on the prior exam. IMPRESSION: Incomplete, nondisplaced diaphyseal fracture along the midportion of the patient's intramedullary nail is not visible on the immediate postoperative films. Left intertrochanteric fracture without change in position or alignment. Electronically Signed   By: Drusilla Kannerhomas  Dalessio M.D.   On: 12/17/2018 15:10   Ct Head Code Stroke Wo Contrast  Result Date: 01/11/2019 CLINICAL DATA:  Code stroke.  Aphasia EXAM: CT HEAD WITHOUT CONTRAST TECHNIQUE: Contiguous  axial images were obtained from the base of the skull through the vertex without intravenous contrast. COMPARISON:  12/17/2018 FINDINGS: Brain: There is no mass, hemorrhage or extra-axial collection. There is generalized atrophy without lobar predilection. There is hypoattenuation of the periventricular white matter, most commonly indicating chronic ischemic  microangiopathy. Vascular: No abnormal hyperdensity of the major intracranial arteries or dural venous sinuses. No intracranial atherosclerosis. Skull: The visualized skull base, calvarium and extracranial soft tissues are normal. Sinuses/Orbits: No fluid levels or advanced mucosal thickening of the visualized paranasal sinuses. No mastoid or middle ear effusion. The orbits are normal. ASPECTS [facility] Stroke Program Early CT Score) - Ganglionic level infarction (caudate, lentiform nuclei, internal capsule, insula, M1-M3 cortex): 7 - Supraganglionic infarction (M4-M6 cortex): 3 Total score (0-10 with 10 being normal): 10 IMPRESSION: 1. 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 2. ASPECTS is 10. These results were called by telephone at the time of interpretation on 01/11/2019 at 9:25 pm to Dr. Minna Antis , who verbally acknowledged these results. Electronically Signed   By: Deatra Robinson M.D.   On: 01/11/2019 21:25       Today   Subjective:   Jill Hancock patient complains of feeling weak  Objective:   Blood pressure 125/76, pulse (!) 101, temperature 98.4 F (36.9 C), temperature source Oral, resp. rate 18, height  (1.575 m), weight 65.1 kg, SpO2 95 %.  .  Intake/Output Summary (Last 24 hours) at 01/16/2019 0942 Last data filed at 01/16/2019 0750 Gross per 24 hour  Intake -  Output 800 ml  Net -800 ml    Exam VITAL SIGNS: Blood pressure 125/76, pulse (!) 101, temperature 98.4 F (36.9 C), temperature source Oral, resp. rate 18, height  (1.575 m), weight 65.1 kg, SpO2 95 %.  GENERAL:   78 y.o.-year-old patient lying in the bed with no acute distress.  EYES: Pupils equal, round, reactive to light and accommodation. No scleral icterus. Extraocular muscles intact.  HEENT: Head atraumatic, normocephalic. Oropharynx and nasopharynx clear.  NECK:  Supple, no jugular venous distention. No thyroid enlargement, no tenderness.  LUNGS: Normal breath sounds bilaterally, no wheezing, rales,rhonchi or crepitation. No use of accessory muscles of respiration.  CARDIOVASCULAR: S1, S2 normal. No murmurs, rubs, or gallops.  ABDOMEN: Soft, nontender, nondistended. Bowel sounds present. No organomegaly or mass.  EXTREMITIES: No pedal edema, cyanosis, or clubbing.  NEUROLOGIC: Cranial nerves II through XII are intact. Muscle strength 5/5 in all extremities. Sensation intact. Gait not checked.  PSYCHIATRIC: The patient is alert and oriented x 3.  SKIN: No obvious rash, lesion, or ulcer.   Data Review     CBC w Diff:  Lab Results  Component Value Date   WBC 8.5 01/12/2019   HGB 11.0 (L) 01/12/2019   HCT 34.8 (L) 01/12/2019   PLT 345 01/12/2019   LYMPHOPCT 34 01/11/2019   MONOPCT 8 01/11/2019   EOSPCT 3 01/11/2019   BASOPCT 1 01/11/2019   CMP:  Lab Results  Component Value Date   NA 139 01/11/2019   NA 140 07/29/2015   K 3.4 (L) 01/11/2019   CL 103 01/11/2019   CO2 26 01/11/2019   BUN 10 01/11/2019   BUN 14 07/29/2015   CREATININE 0.65 01/12/2019   CREATININE 0.92 07/22/2018   PROT 6.8 01/11/2019   ALBUMIN 3.3 (L) 01/11/2019   BILITOT 0.2 (L) 01/11/2019   ALKPHOS 83 01/11/2019   AST 23 01/11/2019   ALT 17 01/11/2019  .  Micro Results Recent Results (from the past 240 hour(s))  Urine Culture     Status: Abnormal   Collection Time: 01/11/19 10:15 PM   Specimen: Urine, Catheterized  Result Value Ref Range Status   Specimen Description   Final    URINE, CATHETERIZED Performed at [facility],  [address] [place], [place] [address]    Special Requests    Final    NONE Performed at [facility], [address] [place], [place] [address]    Culture >=100,000 COLONIES/mL ESCHERICHIA COLI (A)  Final   Report Status 01/14/2019 FINAL  Final   Organism ID, Bacteria ESCHERICHIA COLI (A)  Final      Susceptibility   Escherichia coli - MIC*    AMPICILLIN >=32 RESISTANT Resistant     CEFAZOLIN 8 SENSITIVE Sensitive     CEFTRIAXONE <=1 SENSITIVE Sensitive     CIPROFLOXACIN >=4 RESISTANT Resistant     GENTAMICIN <=1 SENSITIVE Sensitive     IMIPENEM <=0.25 SENSITIVE Sensitive     NITROFURANTOIN <=16 SENSITIVE Sensitive     TRIMETH/SULFA >=320 RESISTANT Resistant     AMPICILLIN/SULBACTAM >=32 RESISTANT Resistant     PIP/TAZO <=4 SENSITIVE Sensitive     Extended ESBL NEGATIVE Sensitive     * >=100,000 COLONIES/mL ESCHERICHIA COLI  SARS Coronavirus 2 (CEPHEID - Performed in [facility] hospital lab), Hosp Order     Status: None   Collection Time: 01/11/19 10:21 PM   Specimen: Nasopharyngeal  Result Value Ref Range Status   SARS Coronavirus 2 NEGATIVE NEGATIVE Final    Comment: (NOTE) If result is NEGATIVE SARS-CoV-2 target nucleic acids are NOT DETECTED. The SARS-CoV-2 RNA is generally detectable in upper and lower  respiratory specimens during the acute phase of infection. The lowest  concentration of SARS-CoV-2 viral copies this assay can detect is 250  copies / mL. A negative result does not preclude SARS-CoV-2 infection  and should not be used as the sole basis for treatment or other  patient management decisions.  A negative result may occur with  improper specimen collection / handling, submission of specimen other  than nasopharyngeal swab, presence of viral mutation(s) within the  areas targeted by this assay, and inadequate number of viral copies  (<250 copies / mL). A negative result must be combined with clinical  observations, patient history, and epidemiological information. If result is POSITIVE SARS-CoV-2 target  nucleic acids are DETECTED. The SARS-CoV-2 RNA is generally detectable in upper and lower  respiratory specimens dur ing the acute phase of infection.  Positive  results are indicative of active infection with SARS-CoV-2.  Clinical  correlation with patient history and other diagnostic information is  necessary to determine patient infection status.  Positive results do  not rule out bacterial infection or co-infection with other viruses. If result is PRESUMPTIVE POSTIVE SARS-CoV-2 nucleic acids MAY BE PRESENT.   A presumptive positive result was obtained on the submitted specimen  and confirmed on repeat testing.  While 2019 novel coronavirus  (SARS-CoV-2) nucleic acids may be present in the submitted sample  additional confirmatory testing may be necessary for epidemiological  and / or clinical management purposes  to differentiate between  SARS-CoV-2 and other Sarbecovirus currently known to infect humans.  If clinically indicated additional testing with an alternate test  methodology [phone]) is advised. The SARS-CoV-2 RNA is generally  detectable in upper and lower respiratory sp ecimens during the acute  phase of infection. The expected result is Negative. Fact Sheet for Patients:  BoilerBrush.com.cy Fact Sheet for Healthcare Providers: https://pope.com/ This test is not yet approved or cleared by the [place] FDA and has been authorized for detection and/or diagnosis of SARS-CoV-2 by FDA under an Emergency Use Authorization (EUA).  This EUA will remain in effect (meaning this test can be used)  for the duration of the COVID-19 declaration under Section 564(b)(1) of the Act, 21 U.S.C. section 360bbb-3(b)(1), unless the authorization is terminated or revoked sooner. Performed at [facility], [address] [place], [place] [address]         Code Status Orders  (From admission, onward)         Start      Ordered   01/12/19 0119  Full code  Continuous     01/12/19 0118        Code Status History    Date Active Date Inactive Code Status Order ID Comments User Context   01/12/2019 0058 01/12/2019 0118 DNR [id number]  Oralia Manis, MD Inpatient   12/18/2018 1405 12/23/2018 1821 DNR [id number]  Enid Baas, MD Inpatient   12/17/2018 2118 12/18/2018 1405 Full Code [id number]  Enedina Finner, MD Inpatient   11/21/2018 0159 11/26/2018 2035 Full Code [id number]  Mansy, Vernetta Honey, MD ED   12/31/2017 1641 01/02/2018 2218 Full Code [id number]  Ihor Austin, MD Inpatient   Advance Care Planning Activity    Advance Directive Documentation     Most Recent Value  Type of Advance Directive  Living will, Healthcare Power of Attorney  Pre-existing out of facility DNR order (yellow form or pink MOST form)  -  "MOST" Form in Place?  -            Discharge Medications   Allergies as of 01/16/2019      Reactions   Demerol [meperidine] Nausea And Vomiting   Macrobid [nitrofurantoin Macrocrystal] Rash   Penicillins Rash   Has patient had a PCN reaction causing immediate rash, facial/tongue/throat swelling, SOB or lightheadedness with hypotension: Unknown Has patient had a PCN reaction causing severe rash involving mucus membranes or skin necrosis: Unknown Has patient had a PCN reaction that required hospitalization: Unknown Has patient had a PCN reaction occurring within the last 10 years: No If all of the above answers are "NO", then may proceed with Cephalosporin use.      Medication List    STOP taking these medications   ipratropium 0.06 % nasal spray Commonly known as: ATROVENT   polyethylene glycol 17 g packet Commonly known as: MIRALAX / GLYCOLAX   potassium chloride 10 MEQ tablet Commonly known as: K-DUR   traMADol 50 MG tablet Commonly known as: ULTRAM     TAKE these medications   acetaminophen 325 MG tablet Commonly known as: TYLENOL Take 2 tablets (650 mg total) by mouth every 4  (four) hours as needed for mild pain, moderate pain or fever (or temp > 37.5 C (99.5 F)).   alendronate 70 MG tablet Commonly known as: FOSAMAX TAKE 1 TABLET EVERY 7 DAYS.  SEE PACKAGE FOR ADDITIONAL INSTRUCTIONS What changed:   how much to take  how to take this  when to take this  additional instructions   amLODipine 10 MG tablet Commonly known as: NORVASC TAKE 1 TABLET BY MOUTH EVERY DAY What changed: when to take this   aspirin 81 MG tablet Take 1 tablet (81 mg total) by mouth daily. What changed: when to take this   atorvastatin 40 MG tablet Commonly known as: LIPITOR Take 1 tablet (40 mg total) by mouth daily at 6 PM. What changed: when to take this   cephALEXin 250 MG capsule Commonly known as: KEFLEX Take 1 capsule (250 mg total) by mouth every 6 (six) hours for 2 days.   clopidogrel 75 MG tablet Commonly known as: PLAVIX Take 1 tablet (  75 mg total) by mouth daily.   docusate sodium 100 MG capsule Commonly known as: COLACE Take 1 capsule (100 mg total) by mouth 2 (two) times daily.   feeding supplement (ENSURE ENLIVE) Liqd Take 237 mLs by mouth 2 (two) times daily between meals.   FLUoxetine 40 MG capsule Commonly known as: PROZAC Take 1 capsule (40 mg total) by mouth daily.   lisinopril 40 MG tablet Commonly known as: ZESTRIL Take 1 tablet (40 mg total) by mouth daily. What changed: when to take this   LORazepam 0.5 MG tablet Commonly known as: ATIVAN Take 1 tablet (0.5 mg total) by mouth every 8 (eight) hours as needed for anxiety or sleep (pt uses to help her sleep at night with trazadone). What changed:   when to take this  reasons to take this   Melatonin 5 MG Caps Take 1 capsule (5 mg total) by mouth at bedtime.   methocarbamol 500 MG tablet Commonly known as: ROBAXIN Take 250-500 mg by mouth every 6 (six) hours as needed (pain).   pantoprazole 20 MG tablet Commonly known as: PROTONIX Take 20 mg by mouth daily.   traZODone 100 MG  tablet Commonly known as: DESYREL Take 1 tablet (100 mg total) by mouth at bedtime.   Vitamin D3 50 MCG (2000 UT) Tabs Take 2,000 Units by mouth daily. What changed: Another medication with the same name was removed. Continue taking this medication, and follow the directions you see here.          Total Time in preparing paper work, data evaluation and todays exam - 79 minutes  Dustin Flock M.D on 01/16/2019 at 9:42 AM [employer] [facility]   Office  [phone]

## 2019-01-16 NOTE — Care Management Important Message (Signed)
Important Message  Patient Details  Name: Jill Hancock MRN: [id number] Date of Birth: Sep 06, 1940   Medicare Important Message Given:  Yes     [name] 01/16/2019, 12:54 PM

## 2019-01-16 NOTE — Chronic Care Management (AMB) (Signed)
  Chronic Care Management    Clinical Social Work Follow Up Note  01/16/2019 Name: Jill Hancock MRN: [id number] DOB: 1940/12/01  Jill Hancock is a 78 y.o. year old female who is a primary care patient of Olin Hauser, DO. The CCM team was consulted for assistance with Level of Care Concerns.   Review of patient status, including review of consultants reports, other relevant assessments, and collaboration with appropriate care team members and the patient's provider was performed as part of comprehensive patient evaluation and provision of chronic care management services.     Goals Addressed    . My mom needs LTC       Current Barriers:  . Financial constraints . Housing barriers . Level of care concerns . ADL IADL limitations . Limited education about The entire LTC placement process in regards to Medicaid as the coverage source*  Clinical Social Work Clinical Goal(s):  Marland Kitchen Over the next 90 days, client will work with SW to address concerns related to placing patient into a LTC facility that accepts Medicaid  Interventions: . Patient interviewed and appropriate assessments performed . Provided patient with information about Retro process in terms of back payment from Medicaid that family is eligible for once paying out of pocket, upfront for patient to receive LTC placement. This will only take place is patient is successfully approved for Medicaid after 45 days of review of completed application. Patient currently has Medicaid pending. Copay is $1063.06 . Discussed plans with patient for ongoing care management follow up and provided patient with direct contact information for care management team . Advised patient to check email for list of SNF's in the nearby area that accept Medicaid. . Assisted patient/caregiver with obtaining information about health plan benefits . Provided education to patient/caregiver regarding level of care options. . Patient is actively  working with [place] NP and SW Whitney . Patient will discharge to [facility] today on 01/16/2019. Cecille Rubin ask that contact [employer], SW at [facility] and [place], [place] Medicaid SW at [facility] LCSW spoke with Hilliard Clark and was informed that they do not have any LTC Medicaid beds available and they have been unsuccessful in finding any facility in [place] to accept LTC placement from their facility. LCSW provided update to [place]. Cecille Rubin reports that patient cannot and will not return back to their residence due to safety concerns. LCSW left a detailed voice message for LTC Medicaid SW encouraging a return call and update on pt's Medicaid status now that she will going to [facility] for short term placement. . Patient's care is causing a significant strain as they are unable to properly care for patient in the home. . Patient is also working with [place]   Patient Self Care Activities:  . Currently UNABLE TO independently take appropriate care of self within current home situation and needs a higher level of care  Please see past updates related to this goal by clicking on the "Past Updates" button in the selected goal    Follow Up Plan: SW will follow up with patient by phone over the next 2-4 weeks  Eula Fried, [place], MSW, [email or web address] Phone: [phone]

## 2019-01-16 NOTE — TOC Transition Note (Addendum)
Transition of Care [facility]) - CM/SW Discharge Note   Patient Details  Name: Jill Hancock MRN: [id number] Date of Birth: 10/04/1940  Transition [facility]) CM/SW Contact:  Elza Rafter, RN Phone Number: 01/16/2019, 10:20 AM   Clinical Narrative:   Patient is discharging today to [facility] via EMS room 707.  EMS packet placed on chart.  Can discharge after negative COVID screen today.  [name] Boone-daughter notified.  [facility]  Santiago Glad with out patient palliative aware of discharge.      Final next level of care: Skilled Nursing Facility Barriers to Discharge: No Barriers Identified   Patient Goals and CMS Choice        Discharge Placement              Patient chooses bed at: [facility] [place] Patient to be transferred to facility by: [facility] Name of family member notified: [name] Patient and family notified of of transfer: 01/16/19  Discharge Plan and Services                                     Social Determinants of Health (SDOH) Interventions     Readmission Risk Interventions No flowsheet data found.

## 2019-01-16 NOTE — Progress Notes (Signed)
EMS called for transport.

## 2019-01-21 DIAGNOSIS — M25552 Pain in left hip: Secondary | ICD-10-CM | POA: Diagnosis not present

## 2019-01-21 DIAGNOSIS — I10 Essential (primary) hypertension: Secondary | ICD-10-CM | POA: Diagnosis not present

## 2019-01-21 DIAGNOSIS — K219 Gastro-esophageal reflux disease without esophagitis: Secondary | ICD-10-CM | POA: Diagnosis not present

## 2019-01-21 DIAGNOSIS — F419 Anxiety disorder, unspecified: Secondary | ICD-10-CM | POA: Diagnosis not present

## 2019-01-21 DIAGNOSIS — I6350 Cerebral infarction due to unspecified occlusion or stenosis of unspecified cerebral artery: Secondary | ICD-10-CM | POA: Diagnosis not present

## 2019-01-21 DIAGNOSIS — E785 Hyperlipidemia, unspecified: Secondary | ICD-10-CM | POA: Diagnosis not present

## 2019-01-23 ENCOUNTER — Ambulatory Visit: Payer: Medicare HMO | Admitting: Licensed Clinical Social Worker

## 2019-01-23 DIAGNOSIS — F01518 Vascular dementia, unspecified severity, with other behavioral disturbance: Secondary | ICD-10-CM

## 2019-01-23 DIAGNOSIS — F0151 Vascular dementia with behavioral disturbance: Secondary | ICD-10-CM

## 2019-01-23 NOTE — Chronic Care Management (AMB) (Signed)
Chronic Care Management    Clinical Social Work Follow Up Note  01/23/2019 Name: Jill Hancock MRN: [id number] DOB: [birth year]  Jill Hancock is a 78 y.o. year old female who is a primary care patient of Olin Hauser, DO. The CCM team was consulted for assistance with Level of Care Concerns.   Review of patient status, including review of consultants reports, other relevant assessments, and collaboration with appropriate care team members and the patient's provider was performed as part of comprehensive patient evaluation and provision of chronic care management services.     Goals Addressed    . My mom needs LTC       Current Barriers:  . Financial constraints . Housing barriers . Level of care concerns . ADL IADL limitations . Limited education about The entire LTC placement process in regards to Medicaid as the coverage source*  Clinical Social Work Clinical Goal(s):  Marland Kitchen Over the next 90 days, client will work with SW to address concerns related to placing patient into a LTC facility that accepts Medicaid  Interventions: . Patient interviewed and appropriate assessments performed . Provided patient with information about Retro process in terms of back payment from Medicaid that family is eligible for once paying out of pocket, upfront for patient to receive LTC placement. This will only take place is patient is successfully approved for Medicaid after 45 days of review of completed application. Patient currently has Medicaid pending. Copay is $1063.06 . Discussed plans with patient for ongoing care management follow up and provided patient with direct contact information for care management team . Advised patient to check email for list of SNF's in the nearby area that accept Medicaid. . Assisted patient/caregiver with obtaining information about health plan benefits . Provided education to patient/caregiver regarding level of care options. . Patient is actively  working with [place] NP and SW Whitney . Patient will discharge to [facility] today on 01/16/2019. Cecille Rubin ask that [employer], SW at [employer] and [place], [place] Medicaid SW at [facility] LCSW spoke with Hilliard Clark and was informed that they do not have any LTC Medicaid beds available and they have been unsuccessful in finding any facility in  to accept LTC placement from their facility. LCSW provided update to [place]. Cecille Rubin reports that patient cannot and will not return back to their residence due to safety concerns. LCSW left a detailed voice message for LTC Medicaid SW encouraging a return call and update on pt's Medicaid status now that she will going to [facility] for short term placement. . Patient's care is causing a significant strain as they are unable to properly care for patient in the home. . Patient is also working with [place] . Update- LCSW completed additional outreach attempt to LTC Medicaid caseworker Herbie [place] but was unable to reach her successfully in order to discuss patient's needed LTC placement. Voice message left encouraging a return call once available in order to coordinate care and assist patient.   Patient Self Care Activities:  . Currently UNABLE TO independently take appropriate care of self within current home situation and needs a higher level of care  Please see past updates related to this goal by clicking on the "Past Updates" button in the selected goal   Follow Up Plan: SW will follow up with patient by phone over the next 2-4 weeks  Jill Hancock, [place], MSW, [place]  Network Production assistant, [email or web address] Phone: [phone]

## 2019-01-24 ENCOUNTER — Other Ambulatory Visit: Payer: Self-pay | Admitting: Family Medicine

## 2019-01-24 DIAGNOSIS — M810 Age-related osteoporosis without current pathological fracture: Secondary | ICD-10-CM

## 2019-01-27 ENCOUNTER — Telehealth: Payer: Self-pay

## 2019-01-28 DIAGNOSIS — F419 Anxiety disorder, unspecified: Secondary | ICD-10-CM | POA: Diagnosis not present

## 2019-01-28 DIAGNOSIS — E785 Hyperlipidemia, unspecified: Secondary | ICD-10-CM | POA: Diagnosis not present

## 2019-01-28 DIAGNOSIS — K219 Gastro-esophageal reflux disease without esophagitis: Secondary | ICD-10-CM | POA: Diagnosis not present

## 2019-01-28 DIAGNOSIS — I10 Essential (primary) hypertension: Secondary | ICD-10-CM | POA: Diagnosis not present

## 2019-01-28 DIAGNOSIS — I6350 Cerebral infarction due to unspecified occlusion or stenosis of unspecified cerebral artery: Secondary | ICD-10-CM | POA: Diagnosis not present

## 2019-01-28 DIAGNOSIS — R410 Disorientation, unspecified: Secondary | ICD-10-CM | POA: Diagnosis not present

## 2019-01-29 ENCOUNTER — Non-Acute Institutional Stay: Payer: Self-pay | Admitting: Primary Care

## 2019-01-29 ENCOUNTER — Other Ambulatory Visit: Payer: Self-pay

## 2019-01-29 DIAGNOSIS — Z515 Encounter for palliative care: Secondary | ICD-10-CM | POA: Diagnosis not present

## 2019-01-29 NOTE — Progress Notes (Signed)
Therapist, [facility] Pall[facility] Consult Note Telephone: [phone](336) [phone]  Fax: [phone]) [phone]  TELEHEALTH VISIT STATEMENT Due to the COVID-19 crisis, this visit was done via telemedicine from my office. It was initiated and consented to by this patient and/or family.  PATIENT NAME: Jill CockerLinda S Hancock DOB: 05/13/1941 MRN: [id number]  PRIMARY CARE PROVIDER:  Dorothey BasemanBronstein, David, MD [phone]  REFERRING PROVIDER:  Dorothey BasemanBronstein, David, MD [address] [place] [id number]  RESPONSIBLE PARTY:   Extended Emergency Contact Information Primary Emergency Contact: Justine NullBoone,[name]  [place] Mobile Phone: [phone] Relation: Daughter Secondary Emergency Contact: Rodena GoldmannMurphy, [name] Mobile Phone: [phone] Relation: Daughter  Palliative Care was asked to follow this patient by consultation request of Dorothey BasemanBronstein, David, MD  This Hancock an initial  visit.  ASSESSMENT AND RECOMMENDATIONS:   1. Goals of Care: Maximize quality of life and symptom management. Multiple hospital stays and time at home was difficult due to care giving needs.  2. Symptom Management:   Agitation: Recommend psych consult. This agitation Hancock not new, has had agitation at home. She was calling to her parents, very fretful and crying and she seems to continue the distressed behavior at SNF. Recommend longer acting medication for calming had reviewed for Seroquel or Depakote, but then was hospitalized prior to initiation.  Lorazepam was fairly new but not exceedingly effective, and has , trazodone at hs and prozac.  Pain:  Unable to assess well due to poor historian. Facility MD recently changed to scheduled tylenol. Would recommend this as well as she had pain from a fracture earlier.  Constipation: States she has bms validated by snf records. Regular bms with current protocol.   Disposition: Daughter called last week asking for FL2. She and Ms Joseph ArtWoods are in contact about finding a LTC bed available.   3. Family /Caregiver/Community Supports:  Talked to daughter who Hancock POA and Hancock looking for LTC. She wants to place from SNF and spoke with Shanda BumpsJessica today RE LTC. They are looking into places. I described our services going into LTC if they would like us to continue to follow.   4. Cognitive / Functional decline:  Cognitive decline, pulls mattress off of bed but not removing clothing. Rolls out of room in w/c, fell several days ago from agitation and rolled out. Has mats now.   5. Advanced Care Directive: DNR on file   6. Follow up Palliative Care Visit: Palliative care will continue to follow for goals of care clarification and symptom management. Return 2  weeks or prn.  I spent 35 minutes providing this consultation,  from 1425 to 1450. More than 50% of the time in this consultation was spent coordinating communication.   HISTORY OF PRESENT ILLNESS:  Jill Hancock a 78 y.o. year old female with multiple medical problems including vascular dementia, CVA, depression/anxiety, hypertension, left hip fracture with ORIF, frequent falls, osteoporosis. . Palliative Care was asked to help address goals of care.   CODE STATUS:   PPS: 0% HOSPICE ELIGIBILITY/DIAGNOSIS: TBD  PAST MEDICAL HISTORY:  Past Medical History:  Diagnosis Date  . Anxiety   . Depression   . Hyperglycemia   . Hypertension   . Hypokalemia   . Osteoporosis   . Stroke [facility](HCC) 02/2012   MRI revealed at least 3 subcentimeter acute infarctions with one area of subacute infarction in widely disparate vascular territories including territory of left cerebellum and around right caudate nucleus along with widespread lacunar infarcts, chronic microvascular ischemic change, and  numerous microbleeds suggesting long standing hypertensive cerebrovascular disease.  MRA confirmed intracranial  . Unsteady gait   . Vertigo     SOCIAL HX:  Social History   Tobacco Use  . Smoking status: Never Smoker  . Smokeless tobacco: Never Used   Substance Use Topics  . Alcohol use: No    Alcohol/week: 0.0 standard drinks    ALLERGIES:  Allergies  Allergen Reactions  . Demerol [Meperidine] Nausea And Vomiting  . Macrobid [Nitrofurantoin Macrocrystal] Rash  . Penicillins Rash    Has patient had a PCN reaction causing immediate rash, facial/tongue/throat swelling, SOB or lightheadedness with hypotension: Unknown Has patient had a PCN reaction causing severe rash involving mucus membranes or skin necrosis: Unknown Has patient had a PCN reaction that required hospitalization: Unknown Has patient had a PCN reaction occurring within the last 10 years: No If all of the above answers are "NO", then may proceed with Cephalosporin use.      PERTINENT MEDICATIONS:  Outpatient Encounter Medications as of 01/29/2019  Medication Sig  . acetaminophen (TYLENOL) 325 MG tablet Take 2 tablets (650 mg total) by mouth every 4 (four) hours as needed for mild pain, moderate pain or fever (or temp > 37.5 C (99.5 F)).  Marland Kitchen. alendronate (FOSAMAX) 70 MG tablet Take 1 tablet (70 mg total) by mouth every Monday.  Marland Kitchen. amLODipine (NORVASC) 10 MG tablet TAKE 1 TABLET BY MOUTH EVERY DAY (Patient taking differently: Take 10 mg by mouth at bedtime. )  . aspirin 81 MG tablet Take 1 tablet (81 mg total) by mouth daily. (Patient taking differently: Take 81 mg by mouth at bedtime. )  . atorvastatin (LIPITOR) 40 MG tablet Take 1 tablet (40 mg total) by mouth daily at 6 PM. (Patient taking differently: Take 40 mg by mouth at bedtime. )  . Cholecalciferol (VITAMIN D3) 50 MCG (2000 UT) TABS Take 2,000 Units by mouth daily.  . clopidogrel (PLAVIX) 75 MG tablet Take 1 tablet (75 mg total) by mouth daily.  Marland Kitchen. docusate sodium (COLACE) 100 MG capsule Take 1 capsule (100 mg total) by mouth 2 (two) times daily. (Patient not taking: Reported on 01/11/2019)  . feeding supplement, ENSURE ENLIVE, (ENSURE ENLIVE) LIQD Take 237 mLs by mouth 2 (two) times daily between meals. (Patient not  taking: Reported on 01/11/2019)  . FLUoxetine (PROZAC) 40 MG capsule Take 1 capsule (40 mg total) by mouth daily.  Marland Kitchen. lisinopril (PRINIVIL,ZESTRIL) 40 MG tablet Take 1 tablet (40 mg total) by mouth daily. (Patient taking differently: Take 40 mg by mouth at bedtime. )  . LORazepam (ATIVAN) 0.5 MG tablet Take 1 tablet (0.5 mg total) by mouth every 8 (eight) hours as needed for anxiety or sleep (pt uses to help her sleep at night with trazadone).  . Melatonin 5 MG CAPS Take 1 capsule (5 mg total) by mouth at bedtime.  . methocarbamol (ROBAXIN) 500 MG tablet Take 250-500 mg by mouth every 6 (six) hours as needed (pain).  . pantoprazole (PROTONIX) 20 MG tablet Take 20 mg by mouth daily.  . traZODone (DESYREL) 100 MG tablet Take 1 tablet (100 mg total) by mouth at bedtime.  . [DISCONTINUED] esomeprazole (NEXIUM) 20 MG capsule TAKE 1 CAPSULE (20 MG TOTAL) BY MOUTH DAILY BEFORE BREAKFAST.   No facility-administered encounter medications on file as of 01/29/2019.     PHYSICAL EXAM/ROS:   Current and past weights:  General: NAD, frail appearing, thin Cardiovascular: no chest pain reported, no edema,  Pulmonary: no cough, no increased  SOB Abdomen: appetite fair, endorses constipation, incontinent of bowel GU: denies dysuria, incontinent of urine MSK:  no joint deformities Skin: no rashes or wounds reported Neurological: Weakness, Dementia, distressed appearance, inconsolable.  Jason Coop, NP

## 2019-01-30 ENCOUNTER — Telehealth: Payer: Self-pay

## 2019-01-30 ENCOUNTER — Ambulatory Visit: Payer: Self-pay | Admitting: *Deleted

## 2019-01-30 DIAGNOSIS — F0151 Vascular dementia with behavioral disturbance: Secondary | ICD-10-CM

## 2019-01-30 DIAGNOSIS — F01518 Vascular dementia, unspecified severity, with other behavioral disturbance: Secondary | ICD-10-CM

## 2019-01-30 NOTE — Chronic Care Management (AMB) (Signed)
  Chronic Care Management   Outreach Note  01/30/2019 Name: Jill Hancock MRN: [id number] DOB: [birth year]  Referred by: Juluis Pitch, MD Reason for referral : Chronic Care Management (F/U with daughter related to LTC plans)   An unsuccessful telephone outreach was attempted today. The patient was referred to the case management team for assistance with chronic care management and care coordination. Outreach was placed to daughter Jill Hancock to follow up with her about patients LTC plans. EMR review reveals patient continues to be in the SNF at [facility].   Follow Up Plan: A HIPPA compliant phone message was left for the patient providing contact information and requesting a return call.  The care management team will reach out to the patient again over the next 30 days.   Merlene Morse Ngan Qualls RN, BSN Nurse Case Pharmacist, [facility] Care Management  [phone]) Business Mobile

## 2019-02-06 ENCOUNTER — Ambulatory Visit: Payer: Self-pay | Admitting: *Deleted

## 2019-02-06 DIAGNOSIS — F419 Anxiety disorder, unspecified: Secondary | ICD-10-CM | POA: Diagnosis not present

## 2019-02-06 DIAGNOSIS — D649 Anemia, unspecified: Secondary | ICD-10-CM | POA: Diagnosis not present

## 2019-02-06 DIAGNOSIS — R410 Disorientation, unspecified: Secondary | ICD-10-CM | POA: Diagnosis not present

## 2019-02-06 DIAGNOSIS — F0151 Vascular dementia with behavioral disturbance: Secondary | ICD-10-CM

## 2019-02-06 DIAGNOSIS — Z515 Encounter for palliative care: Secondary | ICD-10-CM

## 2019-02-06 DIAGNOSIS — I6350 Cerebral infarction due to unspecified occlusion or stenosis of unspecified cerebral artery: Secondary | ICD-10-CM | POA: Diagnosis not present

## 2019-02-06 DIAGNOSIS — I10 Essential (primary) hypertension: Secondary | ICD-10-CM | POA: Diagnosis not present

## 2019-02-06 DIAGNOSIS — F01518 Vascular dementia, unspecified severity, with other behavioral disturbance: Secondary | ICD-10-CM

## 2019-02-06 DIAGNOSIS — E785 Hyperlipidemia, unspecified: Secondary | ICD-10-CM | POA: Diagnosis not present

## 2019-02-06 DIAGNOSIS — K219 Gastro-esophageal reflux disease without esophagitis: Secondary | ICD-10-CM | POA: Diagnosis not present

## 2019-02-06 NOTE — Chronic Care Management (AMB) (Signed)
  Chronic Care Management   Follow Up Note   02/06/2019 Name: ROSALIA MCAVOY MRN: [id number] DOB: 1941/04/29  Referred by: Juluis Pitch, MD Reason for referral : Care Coordination (LTC Placement)   TAFFIE ECKMANN is a 78 y.o. year old female who is a primary care patient of Juluis Pitch, MD. The CCM team was consulted for assistance with chronic disease management and care coordination needs.    Review of patient status, including review of consultants reports, relevant laboratory and other test results, and collaboration with appropriate care team members and the patient's provider was performed as part of comprehensive patient evaluation and provision of chronic care management services.    Goals Addressed            This Visit's Progress   . We need help getting mom into long term care. We are unable to care for her. (pt-stated)       Current Barriers:  Marland Kitchen Knowledge Deficits related to the process of pursuing LTC for patient from home . Lacks caregiver support.  . Film/video editor.  . Cognitive Deficits . Daughters unable to care for mother with dementia and broken hip with behavior disturbances at home.  Nurse Case Manager Clinical Goal(s):  Marland Kitchen Over the next 30 days RNCM will work with family to have respite in the home and transfer the patient to a LTC facility.   Interventions:  . Call placed to patient's daughter message left earlier this week to ask how LTC process was going.  . Secure message received today in EMR from [facility] care Palliative NP stating patient has been granted a LTC bed at [facility]. She states she will be able to continue to follow the patient for Palliative care while she is there. . Will make CCM LCSW aware.    Patient Self Care Activities:  . Currently UNABLE TO independently care for patient at home requesting assistance getting patient to LTC   Please see past updates related to this goal by clicking on the "Past Updates"  button in the selected goal          No further follow up required: Patient being transferred to LTC at [place], BSN Nurse Case Manager [facility] Care Management  [phone]) Business Mobile

## 2019-02-10 DIAGNOSIS — E785 Hyperlipidemia, unspecified: Secondary | ICD-10-CM | POA: Diagnosis not present

## 2019-02-10 DIAGNOSIS — D649 Anemia, unspecified: Secondary | ICD-10-CM | POA: Diagnosis not present

## 2019-02-10 DIAGNOSIS — I69322 Dysarthria following cerebral infarction: Secondary | ICD-10-CM | POA: Diagnosis not present

## 2019-02-10 DIAGNOSIS — R41841 Cognitive communication deficit: Secondary | ICD-10-CM | POA: Diagnosis not present

## 2019-02-10 DIAGNOSIS — F329 Major depressive disorder, single episode, unspecified: Secondary | ICD-10-CM | POA: Diagnosis not present

## 2019-02-10 DIAGNOSIS — I10 Essential (primary) hypertension: Secondary | ICD-10-CM | POA: Diagnosis not present

## 2019-02-10 DIAGNOSIS — M6281 Muscle weakness (generalized): Secondary | ICD-10-CM | POA: Diagnosis not present

## 2019-02-10 DIAGNOSIS — M5136 Other intervertebral disc degeneration, lumbar region: Secondary | ICD-10-CM | POA: Diagnosis not present

## 2019-02-11 ENCOUNTER — Non-Acute Institutional Stay: Payer: Self-pay | Admitting: Primary Care

## 2019-02-11 ENCOUNTER — Other Ambulatory Visit: Payer: Self-pay

## 2019-02-11 DIAGNOSIS — I69391 Dysphagia following cerebral infarction: Secondary | ICD-10-CM | POA: Diagnosis not present

## 2019-02-11 DIAGNOSIS — Z515 Encounter for palliative care: Secondary | ICD-10-CM

## 2019-02-11 DIAGNOSIS — I6389 Other cerebral infarction: Secondary | ICD-10-CM | POA: Diagnosis not present

## 2019-02-11 DIAGNOSIS — F0150 Vascular dementia without behavioral disturbance: Secondary | ICD-10-CM | POA: Diagnosis not present

## 2019-02-11 DIAGNOSIS — S22080A Wedge compression fracture of T11-T12 vertebra, initial encounter for closed fracture: Secondary | ICD-10-CM | POA: Diagnosis not present

## 2019-02-11 DIAGNOSIS — F338 Other recurrent depressive disorders: Secondary | ICD-10-CM | POA: Diagnosis not present

## 2019-02-11 DIAGNOSIS — M6281 Muscle weakness (generalized): Secondary | ICD-10-CM | POA: Diagnosis not present

## 2019-02-11 DIAGNOSIS — R451 Restlessness and agitation: Secondary | ICD-10-CM | POA: Diagnosis not present

## 2019-02-11 DIAGNOSIS — R7303 Prediabetes: Secondary | ICD-10-CM | POA: Diagnosis not present

## 2019-02-11 DIAGNOSIS — M1711 Unilateral primary osteoarthritis, right knee: Secondary | ICD-10-CM | POA: Diagnosis not present

## 2019-02-11 NOTE — Progress Notes (Signed)
[facility] Consult Note Telephone: [phone]  Fax: [phone]  TELEHEALTH VISIT STATEMENT Due to the COVID-19 crisis, this visit was done via telemedicine from my office. It was initiated and consented to by this patient and/or family.  PATIENT NAME: Jill CockerLinda S Hancock DOB: May 31, 1941 MRN: [id number]  PRIMARY CARE PROVIDER:  Keane PoliceSlade-Hartman, Venezela, MD [phone]   REFERRING PROVIDER: Keane PoliceSlade-Hartman, Venezela, MD [phone] [place],  [place] [id number]  RESPONSIBLE PARTY:   Extended Emergency Contact Information Primary Emergency Contact: Justine NullBoone,[name]  [place] Mobile Phone: [phone] Relation: Daughter Secondary Emergency Contact: Rodena GoldmannMurphy, [name] Mobile Phone: [phone] Relation: Daughter   ASSESSMENT AND RECOMMENDATIONS:   1. 5. Advance Care Planning/Goals of Care: Goals include to maximize quality of life and symptom management. Patient  Has DNR on file in [facility]. Called daughter to discuss MOST form creation but I will need to call back.   2. Symptom Management:   Pain: Recommend PRN narcotic e.g. tramadol 50 mg q 4 hrs prn. History of compression fractures and Rx with prn narcotics. Her agitation could be pain related. States ongoing pain, needs PRN narcotic. Has a history of multiple compression fractures.  Taking Acetaminophen 650 mg q 6 hrs, regular release ATC but was on ultram at home.  Agitation: Recommend psychiatric referral for ongoing unmanaged agitation and combativeness if not responsive to pain management.  Her combative behavior has been of long standing and was an issue at home. I would recommend getting an assessment and plan from psych now that she is in her long term placement. In the past they have used lorazepam for the agitation but this is not adequate. Daughter concurs  reports of ongoing agitation.  3. Family /Caregiver/Community Supports: Patient is new resident of LTC  facility. She appears rested and calm on the Zoom call. Family had sought placement due to escalating care needs at home. Staff reports periods of agitation which they try to redirect.   4. Cognitive / Functional decline: Appears at cognitive baseline or slightly improved over first visit 6 weeks ago in her home. Reports able to ambulate to bathroom which was also her baseline at admission.  5. Follow up Palliative Care Visit: Palliative care will continue to follow for goals of care clarification and symptom management. Return 4-6 weeks or prn.  I spent 25 minutes providing this consultation,  from 1230  to 1255. More than 50% of the time in this consultation was spent coordinating communication.   HISTORY OF PRESENT ILLNESS:  Jill Hancock is a 78 y.o. year old female with multiple medical problems including dementia, CVA, depression and agitation, history of compression fractures. Palliative Care was asked to follow this patient by consultation request of Slade-Hartman, Alvino ChapelVenezela* to help address advance care planning and goals of care. This is a follow up visit.  CODE STATUS: DNR, MOST TBD  PPS: 40% HOSPICE ELIGIBILITY/DIAGNOSIS: TBD  PAST MEDICAL HISTORY:  Past Medical History:  Diagnosis Date  . Anxiety   . Depression   . Hyperglycemia   . Hypertension   . Hypokalemia   . Osteoporosis   . Stroke [facility](HCC) 02/2012   MRI revealed at least 3 subcentimeter acute infarctions with one area of subacute infarction in widely disparate vascular territories including territory of left cerebellum and around right caudate nucleus along with widespread lacunar infarcts, chronic microvascular ischemic change, and numerous microbleeds suggesting long standing hypertensive cerebrovascular disease.  MRA confirmed intracranial  . Unsteady gait   .  Vertigo     SOCIAL HX:  Social History   Tobacco Use  . Smoking status: Never Smoker  . Smokeless tobacco: Never Used  Substance Use Topics  . Alcohol  use: No    Alcohol/week: 0.0 standard drinks    ALLERGIES:  Allergies  Allergen Reactions  . Demerol [Meperidine] Nausea And Vomiting  . Macrobid [Nitrofurantoin Macrocrystal] Rash  . Penicillins Rash    Has patient had a PCN reaction causing immediate rash, facial/tongue/throat swelling, SOB or lightheadedness with hypotension: Unknown Has patient had a PCN reaction causing severe rash involving mucus membranes or skin necrosis: Unknown Has patient had a PCN reaction that required hospitalization: Unknown Has patient had a PCN reaction occurring within the last 10 years: No If all of the above answers are "NO", then may proceed with Cephalosporin use.      PERTINENT MEDICATIONS:  Outpatient Encounter Medications as of 02/11/2019  Medication Sig  . acetaminophen (TYLENOL) 325 MG tablet Take 2 tablets (650 mg total) by mouth every 4 (four) hours as needed for mild pain, moderate pain or fever (or temp > 37.5 C (99.5 F)).  Marland Kitchen alendronate (FOSAMAX) 70 MG tablet Take 1 tablet (70 mg total) by mouth every Monday.  Marland Kitchen amLODipine (NORVASC) 10 MG tablet TAKE 1 TABLET BY MOUTH EVERY DAY (Patient taking differently: Take 10 mg by mouth at bedtime. )  . aspirin 81 MG tablet Take 1 tablet (81 mg total) by mouth daily. (Patient taking differently: Take 81 mg by mouth at bedtime. )  . atorvastatin (LIPITOR) 40 MG tablet Take 1 tablet (40 mg total) by mouth daily at 6 PM. (Patient taking differently: Take 40 mg by mouth at bedtime. )  . Cholecalciferol (VITAMIN D3) 50 MCG (2000 UT) TABS Take 2,000 Units by mouth daily.  . clopidogrel (PLAVIX) 75 MG tablet Take 1 tablet (75 mg total) by mouth daily.  Marland Kitchen docusate sodium (COLACE) 100 MG capsule Take 1 capsule (100 mg total) by mouth 2 (two) times daily. (Patient not taking: Reported on 01/11/2019)  . feeding supplement, ENSURE ENLIVE, (ENSURE ENLIVE) LIQD Take 237 mLs by mouth 2 (two) times daily between meals. (Patient not taking: Reported on 01/11/2019)  .  FLUoxetine (PROZAC) 40 MG capsule Take 1 capsule (40 mg total) by mouth daily.  Marland Kitchen lisinopril (PRINIVIL,ZESTRIL) 40 MG tablet Take 1 tablet (40 mg total) by mouth daily. (Patient taking differently: Take 40 mg by mouth at bedtime. )  . LORazepam (ATIVAN) 0.5 MG tablet Take 1 tablet (0.5 mg total) by mouth every 8 (eight) hours as needed for anxiety or sleep (pt uses to help her sleep at night with trazadone).  . Melatonin 5 MG CAPS Take 1 capsule (5 mg total) by mouth at bedtime.  . methocarbamol (ROBAXIN) 500 MG tablet Take 250-500 mg by mouth every 6 (six) hours as needed (pain).  . pantoprazole (PROTONIX) 20 MG tablet Take 20 mg by mouth daily.  . traZODone (DESYREL) 100 MG tablet Take 1 tablet (100 mg total) by mouth at bedtime.  . [DISCONTINUED] esomeprazole (NEXIUM) 20 MG capsule TAKE 1 CAPSULE (20 MG TOTAL) BY MOUTH DAILY BEFORE BREAKFAST.   No facility-administered encounter medications on file as of 02/11/2019.     PHYSICAL EXAM/ROS:   Current and past weights: 137.4 lb Historically 143 lb.  General: NAD, frail appearing, thin Cardiovascular: no chest pain reported, no edema Pulmonary: no cough, no increased SOB, Room air Abdomen: appetite 50%, denies constipation, incontinent of bowel at times GU: denies  dysuria, incontinent of urine MSK:  no joint deformities, no falls recently. Compression at T 12 and L 3 and L 5 from past. Skin: no rashes or wounds reported Neurological: Weakness, mood at LTC: Agitated, throwing things, hitting, this is her baseline mood. Staff states this behavior is typical. Sleep is poor and was at home.  Bettey MareKathryn M. Wynona CanesSmith DNP, AGPCNP-BC

## 2019-02-13 ENCOUNTER — Ambulatory Visit: Payer: Medicare HMO | Admitting: Licensed Clinical Social Worker

## 2019-02-13 ENCOUNTER — Other Ambulatory Visit: Payer: Self-pay

## 2019-02-13 DIAGNOSIS — Z515 Encounter for palliative care: Secondary | ICD-10-CM

## 2019-02-13 NOTE — Chronic Care Management (AMB) (Signed)
  Care Management   Follow Up Note   02/13/2019 Name: Jill Hancock MRN: [id number] DOB: 05-17-1941  Referred by: Alvester Morin, MD Reason for referral : [name] is a 79 y.o. year old female who is a primary care patient of Alvester Morin, MD. The care management team was consulted for assistance with care management and care coordination needs.    Review of patient status, including review of consultants reports, relevant laboratory and other test results, and collaboration with appropriate care team members and the patient's provider was performed as part of comprehensive patient evaluation and provision of chronic care management services.    Goals Addressed    . COMPLETED: We need help getting mom into long term care. We are unable to care for her. (pt-stated)       Current Barriers:  Marland Kitchen Knowledge Deficits related to the process of pursuing LTC for patient from home . Lacks caregiver support.  . Film/video editor.  . Cognitive Deficits . Daughters unable to care for mother with dementia and broken hip with behavior disturbances at home.  Nurse Case Manager Clinical Goal(s):  Marland Kitchen Over the next 30 days RNCM will work with family to have respite in the home and transfer the patient to a LTC facility.   Interventions:  . Call placed to patient's daughter message left earlier this week to ask how LTC process was going.  . Secure message received today in EMR from [facility] care Palliative NP stating patient has been granted a LTC bed at [facility]. She states she will be able to continue to follow the patient for Palliative care while she is there. Marland Kitchen UPDATE-Patient has been successfully placed at [facility] and has changed her primary care provider to Dr. Alvester Morin, MD. Patient is no longer eligible for CCM services but goal has been met. Family appreciative of CCM assistance. LCSW will update CCM team and PCP.   Patient Self Care Activities:  . Currently UNABLE TO independently care for patient at home requesting assistance getting patient to LTC   Please see past updates related to this goal by clicking on the "Past Updates" button in the selected goal      No further follow up required: Patient has been approved for LTC Medicaid and was successfully placed at [facility]. Patient has changed primary care proivders as well.  Eula Fried, BSW, MSW, [email or web address] Phone: [phone]

## 2019-02-19 DIAGNOSIS — M6281 Muscle weakness (generalized): Secondary | ICD-10-CM | POA: Diagnosis not present

## 2019-02-19 DIAGNOSIS — I69322 Dysarthria following cerebral infarction: Secondary | ICD-10-CM | POA: Diagnosis not present

## 2019-02-19 DIAGNOSIS — Z9181 History of falling: Secondary | ICD-10-CM | POA: Diagnosis not present

## 2019-02-19 DIAGNOSIS — G4701 Insomnia due to medical condition: Secondary | ICD-10-CM | POA: Diagnosis not present

## 2019-02-19 DIAGNOSIS — R41841 Cognitive communication deficit: Secondary | ICD-10-CM | POA: Diagnosis not present

## 2019-02-19 DIAGNOSIS — F0281 Dementia in other diseases classified elsewhere with behavioral disturbance: Secondary | ICD-10-CM | POA: Diagnosis not present

## 2019-02-19 DIAGNOSIS — F329 Major depressive disorder, single episode, unspecified: Secondary | ICD-10-CM | POA: Diagnosis not present

## 2019-02-20 ENCOUNTER — Other Ambulatory Visit: Payer: Self-pay | Admitting: Family Medicine

## 2019-02-20 DIAGNOSIS — I10 Essential (primary) hypertension: Secondary | ICD-10-CM

## 2019-02-22 DIAGNOSIS — I69322 Dysarthria following cerebral infarction: Secondary | ICD-10-CM | POA: Diagnosis not present

## 2019-02-22 DIAGNOSIS — R41841 Cognitive communication deficit: Secondary | ICD-10-CM | POA: Diagnosis not present

## 2019-02-22 DIAGNOSIS — M6281 Muscle weakness (generalized): Secondary | ICD-10-CM | POA: Diagnosis not present

## 2019-02-23 DIAGNOSIS — I69322 Dysarthria following cerebral infarction: Secondary | ICD-10-CM | POA: Diagnosis not present

## 2019-02-23 DIAGNOSIS — M6281 Muscle weakness (generalized): Secondary | ICD-10-CM | POA: Diagnosis not present

## 2019-02-23 DIAGNOSIS — R41841 Cognitive communication deficit: Secondary | ICD-10-CM | POA: Diagnosis not present

## 2019-02-24 ENCOUNTER — Telehealth: Payer: Self-pay

## 2019-02-24 DIAGNOSIS — R41841 Cognitive communication deficit: Secondary | ICD-10-CM | POA: Diagnosis not present

## 2019-02-24 DIAGNOSIS — M6281 Muscle weakness (generalized): Secondary | ICD-10-CM | POA: Diagnosis not present

## 2019-02-24 DIAGNOSIS — I69322 Dysarthria following cerebral infarction: Secondary | ICD-10-CM | POA: Diagnosis not present

## 2019-02-25 DIAGNOSIS — M6281 Muscle weakness (generalized): Secondary | ICD-10-CM | POA: Diagnosis not present

## 2019-02-25 DIAGNOSIS — R41841 Cognitive communication deficit: Secondary | ICD-10-CM | POA: Diagnosis not present

## 2019-02-25 DIAGNOSIS — M1711 Unilateral primary osteoarthritis, right knee: Secondary | ICD-10-CM | POA: Diagnosis not present

## 2019-02-25 DIAGNOSIS — F338 Other recurrent depressive disorders: Secondary | ICD-10-CM | POA: Diagnosis not present

## 2019-02-25 DIAGNOSIS — F0151 Vascular dementia with behavioral disturbance: Secondary | ICD-10-CM | POA: Diagnosis not present

## 2019-02-25 DIAGNOSIS — I69322 Dysarthria following cerebral infarction: Secondary | ICD-10-CM | POA: Diagnosis not present

## 2019-02-26 DIAGNOSIS — R41841 Cognitive communication deficit: Secondary | ICD-10-CM | POA: Diagnosis not present

## 2019-02-26 DIAGNOSIS — I69322 Dysarthria following cerebral infarction: Secondary | ICD-10-CM | POA: Diagnosis not present

## 2019-02-26 DIAGNOSIS — M6281 Muscle weakness (generalized): Secondary | ICD-10-CM | POA: Diagnosis not present

## 2019-02-27 DIAGNOSIS — I69322 Dysarthria following cerebral infarction: Secondary | ICD-10-CM | POA: Diagnosis not present

## 2019-02-27 DIAGNOSIS — R109 Unspecified abdominal pain: Secondary | ICD-10-CM | POA: Diagnosis not present

## 2019-02-27 DIAGNOSIS — M6281 Muscle weakness (generalized): Secondary | ICD-10-CM | POA: Diagnosis not present

## 2019-02-27 DIAGNOSIS — R41841 Cognitive communication deficit: Secondary | ICD-10-CM | POA: Diagnosis not present

## 2019-02-28 DIAGNOSIS — M6281 Muscle weakness (generalized): Secondary | ICD-10-CM | POA: Diagnosis not present

## 2019-02-28 DIAGNOSIS — R41841 Cognitive communication deficit: Secondary | ICD-10-CM | POA: Diagnosis not present

## 2019-02-28 DIAGNOSIS — R1030 Lower abdominal pain, unspecified: Secondary | ICD-10-CM | POA: Diagnosis not present

## 2019-02-28 DIAGNOSIS — I69322 Dysarthria following cerebral infarction: Secondary | ICD-10-CM | POA: Diagnosis not present

## 2019-02-28 DIAGNOSIS — R338 Other retention of urine: Secondary | ICD-10-CM | POA: Diagnosis not present

## 2019-03-01 DIAGNOSIS — N026 Recurrent and persistent hematuria with dense deposit disease: Secondary | ICD-10-CM | POA: Diagnosis not present

## 2019-03-01 DIAGNOSIS — R319 Hematuria, unspecified: Secondary | ICD-10-CM | POA: Diagnosis not present

## 2019-03-01 DIAGNOSIS — M6281 Muscle weakness (generalized): Secondary | ICD-10-CM | POA: Diagnosis not present

## 2019-03-01 DIAGNOSIS — I69322 Dysarthria following cerebral infarction: Secondary | ICD-10-CM | POA: Diagnosis not present

## 2019-03-01 DIAGNOSIS — N390 Urinary tract infection, site not specified: Secondary | ICD-10-CM | POA: Diagnosis not present

## 2019-03-01 DIAGNOSIS — R41841 Cognitive communication deficit: Secondary | ICD-10-CM | POA: Diagnosis not present

## 2019-03-02 DIAGNOSIS — M6281 Muscle weakness (generalized): Secondary | ICD-10-CM | POA: Diagnosis not present

## 2019-03-02 DIAGNOSIS — I69322 Dysarthria following cerebral infarction: Secondary | ICD-10-CM | POA: Diagnosis not present

## 2019-03-02 DIAGNOSIS — R41841 Cognitive communication deficit: Secondary | ICD-10-CM | POA: Diagnosis not present

## 2019-03-03 DIAGNOSIS — N390 Urinary tract infection, site not specified: Secondary | ICD-10-CM | POA: Diagnosis not present

## 2019-03-03 DIAGNOSIS — R41841 Cognitive communication deficit: Secondary | ICD-10-CM | POA: Diagnosis not present

## 2019-03-03 DIAGNOSIS — R319 Hematuria, unspecified: Secondary | ICD-10-CM | POA: Diagnosis not present

## 2019-03-03 DIAGNOSIS — M6281 Muscle weakness (generalized): Secondary | ICD-10-CM | POA: Diagnosis not present

## 2019-03-03 DIAGNOSIS — I69322 Dysarthria following cerebral infarction: Secondary | ICD-10-CM | POA: Diagnosis not present

## 2019-03-03 DIAGNOSIS — R300 Dysuria: Secondary | ICD-10-CM | POA: Diagnosis not present

## 2019-03-03 DIAGNOSIS — W1789XA Other fall from one level to another, initial encounter: Secondary | ICD-10-CM | POA: Diagnosis not present

## 2019-03-03 DIAGNOSIS — R338 Other retention of urine: Secondary | ICD-10-CM | POA: Diagnosis not present

## 2019-03-04 DIAGNOSIS — R41841 Cognitive communication deficit: Secondary | ICD-10-CM | POA: Diagnosis not present

## 2019-03-04 DIAGNOSIS — M6281 Muscle weakness (generalized): Secondary | ICD-10-CM | POA: Diagnosis not present

## 2019-03-04 DIAGNOSIS — I69322 Dysarthria following cerebral infarction: Secondary | ICD-10-CM | POA: Diagnosis not present

## 2019-03-05 DIAGNOSIS — I69322 Dysarthria following cerebral infarction: Secondary | ICD-10-CM | POA: Diagnosis not present

## 2019-03-05 DIAGNOSIS — R338 Other retention of urine: Secondary | ICD-10-CM | POA: Diagnosis not present

## 2019-03-05 DIAGNOSIS — R41841 Cognitive communication deficit: Secondary | ICD-10-CM | POA: Diagnosis not present

## 2019-03-05 DIAGNOSIS — M6281 Muscle weakness (generalized): Secondary | ICD-10-CM | POA: Diagnosis not present

## 2019-03-06 DIAGNOSIS — M6281 Muscle weakness (generalized): Secondary | ICD-10-CM | POA: Diagnosis not present

## 2019-03-06 DIAGNOSIS — I69322 Dysarthria following cerebral infarction: Secondary | ICD-10-CM | POA: Diagnosis not present

## 2019-03-06 DIAGNOSIS — R41841 Cognitive communication deficit: Secondary | ICD-10-CM | POA: Diagnosis not present

## 2019-03-07 DIAGNOSIS — R41841 Cognitive communication deficit: Secondary | ICD-10-CM | POA: Diagnosis not present

## 2019-03-07 DIAGNOSIS — M6281 Muscle weakness (generalized): Secondary | ICD-10-CM | POA: Diagnosis not present

## 2019-03-07 DIAGNOSIS — I69322 Dysarthria following cerebral infarction: Secondary | ICD-10-CM | POA: Diagnosis not present

## 2019-03-08 DIAGNOSIS — M6281 Muscle weakness (generalized): Secondary | ICD-10-CM | POA: Diagnosis not present

## 2019-03-08 DIAGNOSIS — I69322 Dysarthria following cerebral infarction: Secondary | ICD-10-CM | POA: Diagnosis not present

## 2019-03-08 DIAGNOSIS — R41841 Cognitive communication deficit: Secondary | ICD-10-CM | POA: Diagnosis not present

## 2019-03-10 DIAGNOSIS — R41841 Cognitive communication deficit: Secondary | ICD-10-CM | POA: Diagnosis not present

## 2019-03-10 DIAGNOSIS — I69322 Dysarthria following cerebral infarction: Secondary | ICD-10-CM | POA: Diagnosis not present

## 2019-03-10 DIAGNOSIS — M6281 Muscle weakness (generalized): Secondary | ICD-10-CM | POA: Diagnosis not present

## 2019-03-11 ENCOUNTER — Other Ambulatory Visit: Payer: Self-pay

## 2019-03-11 ENCOUNTER — Non-Acute Institutional Stay: Payer: Self-pay | Admitting: Primary Care

## 2019-03-11 DIAGNOSIS — R41841 Cognitive communication deficit: Secondary | ICD-10-CM | POA: Diagnosis not present

## 2019-03-11 DIAGNOSIS — I69322 Dysarthria following cerebral infarction: Secondary | ICD-10-CM | POA: Diagnosis not present

## 2019-03-11 DIAGNOSIS — Z515 Encounter for palliative care: Secondary | ICD-10-CM

## 2019-03-11 DIAGNOSIS — M6281 Muscle weakness (generalized): Secondary | ICD-10-CM | POA: Diagnosis not present

## 2019-03-11 NOTE — Progress Notes (Signed)
[facility] Palliative Care Consult Note Telephone: [phone]  Fax: [phone]  TELEHEALTH VISIT STATEMENT Due to the COVID-19 crisis, this visit was done via telemedicine from my office. It was initiated and consented to by this patient and/or family.  PATIENT NAME: Jill Hancock DOB: 04/11/1941 MRN: [id number]  PRIMARY CARE PROVIDER:   Alvester Morin, MD, [place] [address] [phone]  REFERRING PROVIDER:  Alvester Morin, MD [address] [place],  [place] [address] [phone]  RESPONSIBLE PARTY:   Extended Emergency Contact Information Primary Emergency Contact: Dimas [place] Mobile Phone: [phone] Relation: Daughter Secondary Emergency Contact: Theophilus Kinds Mobile Phone: [phone] Relation: Daughter   ASSESSMENT AND RECOMMENDATIONS:   1. Advance Care Planning/Goals of Care: Goals include to maximize quality of life and symptom management.DNR on record. Still need to discuss MOST with family.  2. Symptom Management:   Pain: Continue current  Regimen, reports no pain today and appears comfortable.Pain was largely from spinal compression fx from several months ago and could be healed. Continue to monitor efficacy.  Behavior; Had been agitated some but that seems to have resolved. She is in a private room which helps with decreasing stimulation.  Constipation: On bowel protocol as needed. No complaints today RE constipation.  3. Family /Caregiver/Community Supports: Lives in LTC as of several months ago. Discouraged RE isolation from Covid. Has family in the area. They brought her a [employer] hamburger today and she very much enjoyed it.  4. Cognitive / Functional decline:  At baseline, perhaps a bit more alert than usual. Answers yes/no answers and one word answers well. Needs assistance in all adls.  5. Follow up Palliative Care Visit: Palliative care  will continue to follow for goals of care clarification and symptom management. Return 4-6 weeks or prn.  I spent 25 minutes providing this consultation,  from 1400 to 1425. More than 50% of the time in this consultation was spent coordinating communication.   HISTORY OF PRESENT ILLNESS:  Jill Hancock is a 78 y.o. year old female with multiple medical problems including dementia with behavior problems, agitation, CVA, depression and agitation, history of compression fractures . Palliative Care was asked to follow this patient by consultation request of Slade-Hartman, Ivette Loyal* to help address advance care planning and goals of care. This is a follow up visit.  CODE STATUS:  DNR  PPS: 30% HOSPICE ELIGIBILITY/DIAGNOSIS: TBD  PAST MEDICAL HISTORY:  Past Medical History:  Diagnosis Date  . Anxiety   . Depression   . Hyperglycemia   . Hypertension   . Hypokalemia   . Osteoporosis   . Stroke [facility]) 02/2012   MRI revealed at least 3 subcentimeter acute infarctions with one area of subacute infarction in widely disparate vascular territories including territory of left cerebellum and around right caudate nucleus along with widespread lacunar infarcts, chronic microvascular ischemic change, and numerous microbleeds suggesting long standing hypertensive cerebrovascular disease.  MRA confirmed intracranial  . Unsteady gait   . Vertigo     SOCIAL HX:  Social History   Tobacco Use  . Smoking status: Never Smoker  . Smokeless tobacco: Never Used  Substance Use Topics  . Alcohol use: No    Alcohol/week: 0.0 standard drinks    ALLERGIES:  Allergies  Allergen Reactions  . Demerol [Meperidine] Nausea And Vomiting  . Macrobid [Nitrofurantoin Macrocrystal] Rash  . Penicillins Rash    Has patient had a PCN reaction causing immediate rash, facial/tongue/throat  swelling, SOB or lightheadedness with hypotension: Unknown Has patient had a PCN reaction causing severe rash involving mucus membranes  or skin necrosis: Unknown Has patient had a PCN reaction that required hospitalization: Unknown Has patient had a PCN reaction occurring within the last 10 years: No If all of the above answers are "NO", then may proceed with Cephalosporin use.      PERTINENT MEDICATIONS:  Outpatient Encounter Medications as of 03/11/2019  Medication Sig  . acetaminophen (TYLENOL) 325 MG tablet Take 2 tablets (650 mg total) by mouth every 4 (four) hours as needed for mild pain, moderate pain or fever (or temp > 37.5 C (99.5 F)).  Marland Kitchen. alendronate (FOSAMAX) 70 MG tablet Take 1 tablet (70 mg total) by mouth every Monday.  Marland Kitchen. amLODipine (NORVASC) 10 MG tablet TAKE 1 TABLET BY MOUTH EVERY DAY (Patient taking differently: Take 10 mg by mouth at bedtime. )  . aspirin 81 MG tablet Take 1 tablet (81 mg total) by mouth daily. (Patient taking differently: Take 81 mg by mouth at bedtime. )  . atorvastatin (LIPITOR) 40 MG tablet Take 1 tablet (40 mg total) by mouth daily at 6 PM. (Patient taking differently: Take 40 mg by mouth at bedtime. )  . Cholecalciferol (VITAMIN D3) 50 MCG (2000 UT) TABS Take 2,000 Units by mouth daily.  . clopidogrel (PLAVIX) 75 MG tablet Take 1 tablet (75 mg total) by mouth daily.  Marland Kitchen. docusate sodium (COLACE) 100 MG capsule Take 1 capsule (100 mg total) by mouth 2 (two) times daily. (Patient not taking: Reported on 01/11/2019)  . feeding supplement, ENSURE ENLIVE, (ENSURE ENLIVE) LIQD Take 237 mLs by mouth 2 (two) times daily between meals. (Patient not taking: Reported on 01/11/2019)  . FLUoxetine (PROZAC) 40 MG capsule Take 1 capsule (40 mg total) by mouth daily.  Marland Kitchen. lisinopril (PRINIVIL,ZESTRIL) 40 MG tablet Take 1 tablet (40 mg total) by mouth daily. (Patient taking differently: Take 40 mg by mouth at bedtime. )  . LORazepam (ATIVAN) 0.5 MG tablet Take 1 tablet (0.5 mg total) by mouth every 8 (eight) hours as needed for anxiety or sleep (pt uses to help her sleep at night with trazadone).  . Melatonin 5  MG CAPS Take 1 capsule (5 mg total) by mouth at bedtime.  . methocarbamol (ROBAXIN) 500 MG tablet Take 250-500 mg by mouth every 6 (six) hours as needed (pain).  . pantoprazole (PROTONIX) 20 MG tablet Take 20 mg by mouth daily.  . traZODone (DESYREL) 100 MG tablet Take 1 tablet (100 mg total) by mouth at bedtime.  . [DISCONTINUED] esomeprazole (NEXIUM) 20 MG capsule TAKE 1 CAPSULE (20 MG TOTAL) BY MOUTH DAILY BEFORE BREAKFAST.   No facility-administered encounter medications on file as of 03/11/2019.     PHYSICAL EXAM / ROS:   VSS BP 130/90 Current and past weights: 141.8 lbs,  Recently was 137.4, some gain. General: NAD, frail appearing, thin Cardiovascular: no chest pain reported, no edema reported  Pulmonary: no cough, no increased SOB, Room air,  ABd: Fair appetite, denies constipation,continent of bowel GU: denies dysuria, continent of urine MSK:  no joint deformities, non ambulatory, pivot transfers, PT , Compression fx which need to heal, create pain. Skin: no rashes or wounds reported Neurological: Weakness, agitation less, sleep is fair to good, better mood in private room.  Marijo FileKathryn M Sadira Standard DNP AGPCNP-BC

## 2019-03-12 DIAGNOSIS — R41841 Cognitive communication deficit: Secondary | ICD-10-CM | POA: Diagnosis not present

## 2019-03-12 DIAGNOSIS — I69322 Dysarthria following cerebral infarction: Secondary | ICD-10-CM | POA: Diagnosis not present

## 2019-03-12 DIAGNOSIS — M6281 Muscle weakness (generalized): Secondary | ICD-10-CM | POA: Diagnosis not present

## 2019-03-13 DIAGNOSIS — F338 Other recurrent depressive disorders: Secondary | ICD-10-CM | POA: Diagnosis not present

## 2019-03-13 DIAGNOSIS — M6281 Muscle weakness (generalized): Secondary | ICD-10-CM | POA: Diagnosis not present

## 2019-03-13 DIAGNOSIS — G4701 Insomnia due to medical condition: Secondary | ICD-10-CM | POA: Diagnosis not present

## 2019-03-13 DIAGNOSIS — F0150 Vascular dementia without behavioral disturbance: Secondary | ICD-10-CM | POA: Diagnosis not present

## 2019-03-13 DIAGNOSIS — I10 Essential (primary) hypertension: Secondary | ICD-10-CM | POA: Diagnosis not present

## 2019-03-13 DIAGNOSIS — F05 Delirium due to known physiological condition: Secondary | ICD-10-CM | POA: Diagnosis not present

## 2019-03-13 DIAGNOSIS — F329 Major depressive disorder, single episode, unspecified: Secondary | ICD-10-CM | POA: Diagnosis not present

## 2019-03-13 DIAGNOSIS — R41841 Cognitive communication deficit: Secondary | ICD-10-CM | POA: Diagnosis not present

## 2019-03-13 DIAGNOSIS — I69322 Dysarthria following cerebral infarction: Secondary | ICD-10-CM | POA: Diagnosis not present

## 2019-03-13 DIAGNOSIS — I69391 Dysphagia following cerebral infarction: Secondary | ICD-10-CM | POA: Diagnosis not present

## 2019-03-13 DIAGNOSIS — R338 Other retention of urine: Secondary | ICD-10-CM | POA: Diagnosis not present

## 2019-03-13 DIAGNOSIS — R7303 Prediabetes: Secondary | ICD-10-CM | POA: Diagnosis not present

## 2019-03-13 DIAGNOSIS — I6389 Other cerebral infarction: Secondary | ICD-10-CM | POA: Diagnosis not present

## 2019-03-13 DIAGNOSIS — F0281 Dementia in other diseases classified elsewhere with behavioral disturbance: Secondary | ICD-10-CM | POA: Diagnosis not present

## 2019-03-13 DIAGNOSIS — S22080A Wedge compression fracture of T11-T12 vertebra, initial encounter for closed fracture: Secondary | ICD-10-CM | POA: Diagnosis not present

## 2019-03-14 DIAGNOSIS — I69322 Dysarthria following cerebral infarction: Secondary | ICD-10-CM | POA: Diagnosis not present

## 2019-03-14 DIAGNOSIS — M6281 Muscle weakness (generalized): Secondary | ICD-10-CM | POA: Diagnosis not present

## 2019-03-14 DIAGNOSIS — R41841 Cognitive communication deficit: Secondary | ICD-10-CM | POA: Diagnosis not present

## 2019-03-16 DIAGNOSIS — I69322 Dysarthria following cerebral infarction: Secondary | ICD-10-CM | POA: Diagnosis not present

## 2019-03-16 DIAGNOSIS — M6281 Muscle weakness (generalized): Secondary | ICD-10-CM | POA: Diagnosis not present

## 2019-03-16 DIAGNOSIS — R41841 Cognitive communication deficit: Secondary | ICD-10-CM | POA: Diagnosis not present

## 2019-03-17 DIAGNOSIS — R41841 Cognitive communication deficit: Secondary | ICD-10-CM | POA: Diagnosis not present

## 2019-03-17 DIAGNOSIS — I69322 Dysarthria following cerebral infarction: Secondary | ICD-10-CM | POA: Diagnosis not present

## 2019-03-17 DIAGNOSIS — M6281 Muscle weakness (generalized): Secondary | ICD-10-CM | POA: Diagnosis not present

## 2019-03-18 DIAGNOSIS — I69322 Dysarthria following cerebral infarction: Secondary | ICD-10-CM | POA: Diagnosis not present

## 2019-03-18 DIAGNOSIS — R41841 Cognitive communication deficit: Secondary | ICD-10-CM | POA: Diagnosis not present

## 2019-03-18 DIAGNOSIS — M6281 Muscle weakness (generalized): Secondary | ICD-10-CM | POA: Diagnosis not present

## 2019-03-19 DIAGNOSIS — R41841 Cognitive communication deficit: Secondary | ICD-10-CM | POA: Diagnosis not present

## 2019-03-19 DIAGNOSIS — M6281 Muscle weakness (generalized): Secondary | ICD-10-CM | POA: Diagnosis not present

## 2019-03-19 DIAGNOSIS — I69322 Dysarthria following cerebral infarction: Secondary | ICD-10-CM | POA: Diagnosis not present

## 2019-03-19 NOTE — Progress Notes (Signed)
03/20/2019 4:51 PM   Jill Hancock [birth year] [id number]  Referring provider: Keane PoliceSlade-Hartman, Venezela, MD [id number] [place],  [place] [id number]  CC: Recurrent UTIs  HPI: Jill Hancock is 78 year old female with a history of stroke, mixed incontinence and incomplete bladder emptying who is here with [facility], Vernona RiegerLaura.    At last visit with Dr. Richardo HanksSninsky in 03/2018, on chart review she has only one prior positive culture over the last year, >100K Pseudomonas on June 14 of 2019.  She denies any symptoms of burning, urgency, frequency, however her daughter feels that her personality changes when she has a urinary infection.  She does have baseline urgency and stress incontinence, requiring depends or pads.  There is not a clear association of urinary symptoms and her prior positive culture.  There are no aggravating or alleviating factors.  Duration is over the last 5 months.  Severity is mild.  She denies history of kidney stones.  She denies flank pain or gross hematuria.  PVR 247 cc, however patient had the urge to void but was unable.  She feels this was secondary to being in clinic today.  She was recently hospitalized 01/11/2019 - 01/16/2019 with mental status changes due to an UTI.    [facility]  She is having CIC at the facility.  [facility] she is to have her bladder scanned every shift (8 hours) and if scan is > 400 cc she is to be straight cathed.  It appears that she has had scans ranging from 3 mL to 800 mL.  She has been cathed twice since 03/04/2019.  Her scans average 200 cc.    She suffers from dementia, but she states she feels fine and is not having any issues.  Her caregiver does not report any issues.  Patient denies any gross hematuria, dysuria or suprapubic/flank pain.  Patient denies any fevers, chills, nausea or vomiting.  Her PVR is 445 mL.    PMH: Past Medical History:   Diagnosis Date  . Anxiety   . Depression   . Hyperglycemia   . Hypertension   . Hypokalemia   . Osteoporosis   . Stroke [facility](HCC) 02/2012   MRI revealed at least 3 subcentimeter acute infarctions with one area of subacute infarction in widely disparate vascular territories including territory of left cerebellum and around right caudate nucleus along with widespread lacunar infarcts, chronic microvascular ischemic change, and numerous microbleeds suggesting long standing hypertensive cerebrovascular disease.  MRA confirmed intracranial  . Unsteady gait   . Vertigo     Surgical History: Past Surgical History:  Procedure Laterality Date  . BREAST BIOPSY Right    pt not sure when   . CHOLECYSTECTOMY    . INTRAMEDULLARY (IM) NAIL INTERTROCHANTERIC Left 11/21/2018   Procedure: INTRAMEDULLARY (IM) NAIL INTERTROCHANTRIC;  Surgeon: Juanell FairlyKrasinski, Kevin, MD;  Location: [facility] ORS;  Service: Orthopedics;  Laterality: Left;  Marland Kitchen. VAGINAL HYSTERECTOMY      Allergies:  Allergies  Allergen Reactions  . Demerol [Meperidine] Nausea And Vomiting  . Macrobid [Nitrofurantoin Macrocrystal] Rash  . Penicillins Rash    Has patient had a PCN reaction causing immediate rash, facial/tongue/throat swelling, SOB or lightheadedness with hypotension: Unknown Has patient had a PCN reaction causing severe rash involving mucus membranes or skin necrosis: Unknown Has patient had a PCN reaction that required hospitalization: Unknown Has patient had a PCN reaction occurring within  the last 10 years: No If all of the above answers are "NO", then may proceed with Cephalosporin use.     Family History: Family History  Problem Relation Age of Onset  . Heart attack Mother   . Heart disease Mother   . Heart disease Father   . Heart attack Brother        death at age 42  . Stroke Brother        death at 4  . Bladder Cancer Neg Hx   . Kidney cancer Neg Hx     Social History:  reports that she has never smoked. She has  never used smokeless tobacco. She reports that she does not drink alcohol or use drugs.  ROS: Please see flowsheet from today's date for complete review of systems.  Physical Exam: BP 128/83   Pulse 93   Ht 5\' 2"  (1.575 m)   BMI 26.26 kg/m   Constitutional:  Well nourished. Alert and oriented, No acute distress. HEENT:  AT, moist mucus membranes.  Trachea midline, no masses. Cardiovascular: No clubbing, cyanosis, or edema. Respiratory: Normal respiratory effort, no increased work of breathing. Neurologic: Grossly intact, no focal deficits, moving all 4 extremities. Psychiatric: Normal mood and affect.   Laboratory Data: No recent labs  Pertinent Imaging: None to review  In and Out Catheterization Patient is present today for a I & O catheterization due to incomplete empyting. Patient was cleaned and prepped in a sterile fashion with betadine and Lidocaine 2% jelly was instilled into the urethra.  A 14 FR cath was inserted no complications were noted , 400 ml of urine return was noted, urine was yellow in color.  Bladder was drained  And catheter was removed with out difficulty.    Performed by: Kyra Manges, CMA   Assessment & Plan:    1. Incomplete emptying Will continue CIC as opposed to an indwelling Foley will increase her risk for rUTI's and sediment formation.  She seems to be tolerating the cathing.  I did change the order to read bladder scan every shift, q 8 hours, and to straight cath if scan is > 300 cc.   They are to contact our office for symptoms of dysuria, frequency, pelvic pain and/or fever  Return if symptoms worsen or fail to improve.  Zara Council, PA-C  [facility] [address] [place],  [address] [phone]

## 2019-03-20 ENCOUNTER — Ambulatory Visit (INDEPENDENT_AMBULATORY_CARE_PROVIDER_SITE_OTHER): Payer: Medicare HMO | Admitting: Urology

## 2019-03-20 ENCOUNTER — Encounter: Payer: Self-pay | Admitting: Urology

## 2019-03-20 ENCOUNTER — Other Ambulatory Visit: Payer: Self-pay

## 2019-03-20 VITALS — BP 128/83 | HR 93 | Ht 62.0 in

## 2019-03-20 DIAGNOSIS — R339 Retention of urine, unspecified: Secondary | ICD-10-CM

## 2019-03-20 DIAGNOSIS — R41841 Cognitive communication deficit: Secondary | ICD-10-CM | POA: Diagnosis not present

## 2019-03-20 DIAGNOSIS — M6281 Muscle weakness (generalized): Secondary | ICD-10-CM | POA: Diagnosis not present

## 2019-03-20 DIAGNOSIS — I69322 Dysarthria following cerebral infarction: Secondary | ICD-10-CM | POA: Diagnosis not present

## 2019-03-20 LAB — BLADDER SCAN AMB NON-IMAGING: Scan Result: 445

## 2019-03-21 DIAGNOSIS — R338 Other retention of urine: Secondary | ICD-10-CM | POA: Diagnosis not present

## 2019-03-21 DIAGNOSIS — R41841 Cognitive communication deficit: Secondary | ICD-10-CM | POA: Diagnosis not present

## 2019-03-21 DIAGNOSIS — I69322 Dysarthria following cerebral infarction: Secondary | ICD-10-CM | POA: Diagnosis not present

## 2019-03-21 DIAGNOSIS — M6281 Muscle weakness (generalized): Secondary | ICD-10-CM | POA: Diagnosis not present

## 2019-03-21 DIAGNOSIS — Z03818 Encounter for observation for suspected exposure to other biological agents ruled out: Secondary | ICD-10-CM | POA: Diagnosis not present

## 2019-03-22 DIAGNOSIS — I69322 Dysarthria following cerebral infarction: Secondary | ICD-10-CM | POA: Diagnosis not present

## 2019-03-22 DIAGNOSIS — M6281 Muscle weakness (generalized): Secondary | ICD-10-CM | POA: Diagnosis not present

## 2019-03-22 DIAGNOSIS — R41841 Cognitive communication deficit: Secondary | ICD-10-CM | POA: Diagnosis not present

## 2019-03-24 DIAGNOSIS — R41841 Cognitive communication deficit: Secondary | ICD-10-CM | POA: Diagnosis not present

## 2019-03-24 DIAGNOSIS — M6281 Muscle weakness (generalized): Secondary | ICD-10-CM | POA: Diagnosis not present

## 2019-03-24 DIAGNOSIS — I69322 Dysarthria following cerebral infarction: Secondary | ICD-10-CM | POA: Diagnosis not present

## 2019-03-25 DIAGNOSIS — I69322 Dysarthria following cerebral infarction: Secondary | ICD-10-CM | POA: Diagnosis not present

## 2019-03-25 DIAGNOSIS — M6281 Muscle weakness (generalized): Secondary | ICD-10-CM | POA: Diagnosis not present

## 2019-03-25 DIAGNOSIS — R41841 Cognitive communication deficit: Secondary | ICD-10-CM | POA: Diagnosis not present

## 2019-03-26 DIAGNOSIS — I69322 Dysarthria following cerebral infarction: Secondary | ICD-10-CM | POA: Diagnosis not present

## 2019-03-26 DIAGNOSIS — R41841 Cognitive communication deficit: Secondary | ICD-10-CM | POA: Diagnosis not present

## 2019-03-26 DIAGNOSIS — M6281 Muscle weakness (generalized): Secondary | ICD-10-CM | POA: Diagnosis not present

## 2019-03-27 DIAGNOSIS — I69322 Dysarthria following cerebral infarction: Secondary | ICD-10-CM | POA: Diagnosis not present

## 2019-03-27 DIAGNOSIS — M6281 Muscle weakness (generalized): Secondary | ICD-10-CM | POA: Diagnosis not present

## 2019-03-27 DIAGNOSIS — R41841 Cognitive communication deficit: Secondary | ICD-10-CM | POA: Diagnosis not present

## 2019-03-28 DIAGNOSIS — R41841 Cognitive communication deficit: Secondary | ICD-10-CM | POA: Diagnosis not present

## 2019-03-28 DIAGNOSIS — I69322 Dysarthria following cerebral infarction: Secondary | ICD-10-CM | POA: Diagnosis not present

## 2019-03-28 DIAGNOSIS — M6281 Muscle weakness (generalized): Secondary | ICD-10-CM | POA: Diagnosis not present

## 2019-03-31 DIAGNOSIS — M6281 Muscle weakness (generalized): Secondary | ICD-10-CM | POA: Diagnosis not present

## 2019-03-31 DIAGNOSIS — I69322 Dysarthria following cerebral infarction: Secondary | ICD-10-CM | POA: Diagnosis not present

## 2019-03-31 DIAGNOSIS — R41841 Cognitive communication deficit: Secondary | ICD-10-CM | POA: Diagnosis not present

## 2019-04-01 DIAGNOSIS — I69322 Dysarthria following cerebral infarction: Secondary | ICD-10-CM | POA: Diagnosis not present

## 2019-04-01 DIAGNOSIS — M6281 Muscle weakness (generalized): Secondary | ICD-10-CM | POA: Diagnosis not present

## 2019-04-01 DIAGNOSIS — R41841 Cognitive communication deficit: Secondary | ICD-10-CM | POA: Diagnosis not present

## 2019-04-02 DIAGNOSIS — R41841 Cognitive communication deficit: Secondary | ICD-10-CM | POA: Diagnosis not present

## 2019-04-02 DIAGNOSIS — M6281 Muscle weakness (generalized): Secondary | ICD-10-CM | POA: Diagnosis not present

## 2019-04-02 DIAGNOSIS — I69322 Dysarthria following cerebral infarction: Secondary | ICD-10-CM | POA: Diagnosis not present

## 2019-04-03 DIAGNOSIS — Z23 Encounter for immunization: Secondary | ICD-10-CM | POA: Diagnosis not present

## 2019-04-03 DIAGNOSIS — R41841 Cognitive communication deficit: Secondary | ICD-10-CM | POA: Diagnosis not present

## 2019-04-03 DIAGNOSIS — M6281 Muscle weakness (generalized): Secondary | ICD-10-CM | POA: Diagnosis not present

## 2019-04-04 DIAGNOSIS — Z23 Encounter for immunization: Secondary | ICD-10-CM | POA: Diagnosis not present

## 2019-04-04 DIAGNOSIS — R41841 Cognitive communication deficit: Secondary | ICD-10-CM | POA: Diagnosis not present

## 2019-04-04 DIAGNOSIS — M6281 Muscle weakness (generalized): Secondary | ICD-10-CM | POA: Diagnosis not present

## 2019-04-06 DIAGNOSIS — R41841 Cognitive communication deficit: Secondary | ICD-10-CM | POA: Diagnosis not present

## 2019-04-06 DIAGNOSIS — M6281 Muscle weakness (generalized): Secondary | ICD-10-CM | POA: Diagnosis not present

## 2019-04-06 DIAGNOSIS — Z23 Encounter for immunization: Secondary | ICD-10-CM | POA: Diagnosis not present

## 2019-04-07 DIAGNOSIS — I10 Essential (primary) hypertension: Secondary | ICD-10-CM | POA: Diagnosis not present

## 2019-04-07 DIAGNOSIS — E785 Hyperlipidemia, unspecified: Secondary | ICD-10-CM | POA: Diagnosis not present

## 2019-04-07 DIAGNOSIS — Z79899 Other long term (current) drug therapy: Secondary | ICD-10-CM | POA: Diagnosis not present

## 2019-04-07 DIAGNOSIS — E119 Type 2 diabetes mellitus without complications: Secondary | ICD-10-CM | POA: Diagnosis not present

## 2019-04-08 DIAGNOSIS — Z23 Encounter for immunization: Secondary | ICD-10-CM | POA: Diagnosis not present

## 2019-04-08 DIAGNOSIS — M6281 Muscle weakness (generalized): Secondary | ICD-10-CM | POA: Diagnosis not present

## 2019-04-08 DIAGNOSIS — R41841 Cognitive communication deficit: Secondary | ICD-10-CM | POA: Diagnosis not present

## 2019-04-09 DIAGNOSIS — R41841 Cognitive communication deficit: Secondary | ICD-10-CM | POA: Diagnosis not present

## 2019-04-09 DIAGNOSIS — M6281 Muscle weakness (generalized): Secondary | ICD-10-CM | POA: Diagnosis not present

## 2019-04-09 DIAGNOSIS — Z23 Encounter for immunization: Secondary | ICD-10-CM | POA: Diagnosis not present

## 2019-04-11 DIAGNOSIS — M6281 Muscle weakness (generalized): Secondary | ICD-10-CM | POA: Diagnosis not present

## 2019-04-11 DIAGNOSIS — Z23 Encounter for immunization: Secondary | ICD-10-CM | POA: Diagnosis not present

## 2019-04-11 DIAGNOSIS — R41841 Cognitive communication deficit: Secondary | ICD-10-CM | POA: Diagnosis not present

## 2019-04-13 DIAGNOSIS — Z23 Encounter for immunization: Secondary | ICD-10-CM | POA: Diagnosis not present

## 2019-04-13 DIAGNOSIS — R41841 Cognitive communication deficit: Secondary | ICD-10-CM | POA: Diagnosis not present

## 2019-04-13 DIAGNOSIS — M6281 Muscle weakness (generalized): Secondary | ICD-10-CM | POA: Diagnosis not present

## 2019-04-14 DIAGNOSIS — Z23 Encounter for immunization: Secondary | ICD-10-CM | POA: Diagnosis not present

## 2019-04-14 DIAGNOSIS — M6281 Muscle weakness (generalized): Secondary | ICD-10-CM | POA: Diagnosis not present

## 2019-04-14 DIAGNOSIS — R41841 Cognitive communication deficit: Secondary | ICD-10-CM | POA: Diagnosis not present

## 2019-04-15 ENCOUNTER — Other Ambulatory Visit: Payer: Self-pay

## 2019-04-15 ENCOUNTER — Non-Acute Institutional Stay: Payer: Self-pay | Admitting: Primary Care

## 2019-04-15 DIAGNOSIS — R2681 Unsteadiness on feet: Secondary | ICD-10-CM | POA: Diagnosis not present

## 2019-04-15 DIAGNOSIS — S72142D Displaced intertrochanteric fracture of left femur, subsequent encounter for closed fracture with routine healing: Secondary | ICD-10-CM | POA: Diagnosis not present

## 2019-04-15 DIAGNOSIS — F0151 Vascular dementia with behavioral disturbance: Secondary | ICD-10-CM | POA: Diagnosis not present

## 2019-04-15 DIAGNOSIS — I10 Essential (primary) hypertension: Secondary | ICD-10-CM | POA: Diagnosis not present

## 2019-04-15 DIAGNOSIS — K219 Gastro-esophageal reflux disease without esophagitis: Secondary | ICD-10-CM | POA: Diagnosis not present

## 2019-04-15 DIAGNOSIS — F338 Other recurrent depressive disorders: Secondary | ICD-10-CM | POA: Diagnosis not present

## 2019-04-15 DIAGNOSIS — I6389 Other cerebral infarction: Secondary | ICD-10-CM | POA: Diagnosis not present

## 2019-04-15 DIAGNOSIS — I69391 Dysphagia following cerebral infarction: Secondary | ICD-10-CM | POA: Diagnosis not present

## 2019-04-15 DIAGNOSIS — R7303 Prediabetes: Secondary | ICD-10-CM | POA: Diagnosis not present

## 2019-04-16 ENCOUNTER — Non-Acute Institutional Stay: Payer: Self-pay | Admitting: Primary Care

## 2019-04-16 ENCOUNTER — Other Ambulatory Visit: Payer: Self-pay

## 2019-04-16 DIAGNOSIS — R41841 Cognitive communication deficit: Secondary | ICD-10-CM | POA: Diagnosis not present

## 2019-04-16 DIAGNOSIS — M6281 Muscle weakness (generalized): Secondary | ICD-10-CM | POA: Diagnosis not present

## 2019-04-16 DIAGNOSIS — Z23 Encounter for immunization: Secondary | ICD-10-CM | POA: Diagnosis not present

## 2019-04-17 DIAGNOSIS — Z03818 Encounter for observation for suspected exposure to other biological agents ruled out: Secondary | ICD-10-CM | POA: Diagnosis not present

## 2019-04-17 DIAGNOSIS — Z23 Encounter for immunization: Secondary | ICD-10-CM | POA: Diagnosis not present

## 2019-04-17 DIAGNOSIS — M6281 Muscle weakness (generalized): Secondary | ICD-10-CM | POA: Diagnosis not present

## 2019-04-17 DIAGNOSIS — R41841 Cognitive communication deficit: Secondary | ICD-10-CM | POA: Diagnosis not present

## 2019-04-18 ENCOUNTER — Other Ambulatory Visit: Payer: Self-pay

## 2019-04-18 ENCOUNTER — Non-Acute Institutional Stay: Payer: Medicare HMO | Admitting: Primary Care

## 2019-04-18 ENCOUNTER — Non-Acute Institutional Stay: Payer: Self-pay | Admitting: Primary Care

## 2019-04-18 DIAGNOSIS — Z515 Encounter for palliative care: Secondary | ICD-10-CM | POA: Diagnosis not present

## 2019-04-18 DIAGNOSIS — M6281 Muscle weakness (generalized): Secondary | ICD-10-CM | POA: Diagnosis not present

## 2019-04-18 DIAGNOSIS — R41841 Cognitive communication deficit: Secondary | ICD-10-CM | POA: Diagnosis not present

## 2019-04-18 DIAGNOSIS — Z23 Encounter for immunization: Secondary | ICD-10-CM | POA: Diagnosis not present

## 2019-04-18 NOTE — Progress Notes (Signed)
Therapist, nutritional Palliative Care Consult Note Telephone: [phone]  Fax: [phone]  TELEHEALTH VISIT STATEMENT Due to the COVID-19 crisis, this visit was done via telemedicine from my office. It was initiated and consented to by this patient and/or family.  PATIENT NAME: Jill Hancock [address] [place] [address] [phone] (home)  DOB: [birth year] MRN: [id number]  PRIMARY CARE PROVIDER:   Keane Police, MD, [phone] [address] [place] [address] [phone]  REFERRING PROVIDER:  Keane Police, MD [phone] [address] [place],  [place] [address] [phone]  RESPONSIBLE PARTY:   Extended Emergency Contact Information Primary Emergency Contact: Justine Null [place] Mobile Phone: [phone] Relation: Daughter Secondary Emergency Contact: Rodena Goldmann Mobile Phone: [phone] Relation: Daughter   ASSESSMENT AND RECOMMENDATIONS:   1. Advance Care Planning/Goals of Care: Goals include to maximize quality of life and symptom management.   2. Symptom Management:   Agitation:  Has not had periods of great agitation since going into a private room.  Staff states she is generally in a good mood and no behavioral issues have arisen.  Infection Control: Had flu shot yesterday and has continued to test negative for covid.   3. Family /Caregiver/Community Supports: Family has not visited, per staff report. Living in LTC facility now.  4. Cognitive / Functional decline: FAST score 7a, answers questions and mostly flat affect. Able to assist with minimal adls.  5. Follow up Palliative Care Visit: Palliative care will continue to follow for goals of care clarification and symptom management. Return 4-6 weeks or prn.  I spent 25 minutes providing this consultation,  from 1000 to 1025. More than 50% of the time in this consultation was spent coordinating communication.   HISTORY OF PRESENT  ILLNESS:  Jill Hancock is a 78 y.o. year old female with multiple medical problems including dementia with behavior problems, agitation, CVA, depression and agitation, history of compression fractures. Palliative Care was asked to follow this patient by consultation request of Slade-Hartman, Alvino Chapel* to help address advance care planning and goals of care. This is a follow up visit.  CODE STATUS: DNR  PPS: 30% HOSPICE ELIGIBILITY/DIAGNOSIS: TBD  PAST MEDICAL HISTORY:  Past Medical History:  Diagnosis Date  . Anxiety   . Depression   . Hyperglycemia   . Hypertension   . Hypokalemia   . Osteoporosis   . Stroke [facility]) 02/2012   MRI revealed at least 3 subcentimeter acute infarctions with one area of subacute infarction in widely disparate vascular territories including territory of left cerebellum and around right caudate nucleus along with widespread lacunar infarcts, chronic microvascular ischemic change, and numerous microbleeds suggesting long standing hypertensive cerebrovascular disease.  MRA confirmed intracranial  . Unsteady gait   . Vertigo     SOCIAL HX:  Social History   Tobacco Use  . Smoking status: Never Smoker  . Smokeless tobacco: Never Used  Substance Use Topics  . Alcohol use: No    Alcohol/week: 0.0 standard drinks    ALLERGIES:  Allergies  Allergen Reactions  . Demerol [Meperidine] Nausea And Vomiting  . Macrobid [Nitrofurantoin Macrocrystal] Rash  . Penicillins Rash    Has patient had a PCN reaction causing immediate rash, facial/tongue/throat swelling, SOB or lightheadedness with hypotension: Unknown Has patient had a PCN reaction causing severe rash involving mucus membranes or skin necrosis: Unknown Has patient had a PCN reaction that required hospitalization: Unknown Has patient had a PCN reaction occurring within the last  10 years: No If all of the above answers are "NO", then may proceed with Cephalosporin use.      PERTINENT MEDICATIONS:   Outpatient Encounter Medications as of 04/18/2019  Medication Sig  . acetaminophen (TYLENOL) 325 MG tablet Take 2 tablets (650 mg total) by mouth every 4 (four) hours as needed for mild pain, moderate pain or fever (or temp > 37.5 C (99.5 F)).  Marland Kitchen alendronate (FOSAMAX) 70 MG tablet Take 1 tablet (70 mg total) by mouth every Monday.  Marland Kitchen amLODipine (NORVASC) 10 MG tablet TAKE 1 TABLET BY MOUTH EVERY DAY (Patient taking differently: Take 10 mg by mouth at bedtime. )  . aspirin 81 MG tablet Take 1 tablet (81 mg total) by mouth daily. (Patient taking differently: Take 81 mg by mouth at bedtime. )  . atorvastatin (LIPITOR) 40 MG tablet Take 1 tablet (40 mg total) by mouth daily at 6 PM. (Patient taking differently: Take 40 mg by mouth at bedtime. )  . Cholecalciferol (VITAMIN D3) 50 MCG (2000 UT) TABS Take 2,000 Units by mouth daily.  . clopidogrel (PLAVIX) 75 MG tablet Take 1 tablet (75 mg total) by mouth daily.  . divalproex (DEPAKOTE SPRINKLE) 125 MG capsule   . docusate sodium (COLACE) 100 MG capsule Take 1 capsule (100 mg total) by mouth 2 (two) times daily.  . feeding supplement, ENSURE ENLIVE, (ENSURE ENLIVE) LIQD Take 237 mLs by mouth 2 (two) times daily between meals.  Marland Kitchen FLUoxetine (PROZAC) 20 MG capsule   . lisinopril (PRINIVIL,ZESTRIL) 40 MG tablet Take 1 tablet (40 mg total) by mouth daily. (Patient taking differently: Take 40 mg by mouth at bedtime. )  . Melatonin 5 MG CAPS Take 1 capsule (5 mg total) by mouth at bedtime.  . meloxicam (MOBIC) 7.5 MG tablet   . Multiple Vitamins-Minerals (CERTAVITE/ANTIOXIDANTS) TABS Take by mouth.  . pantoprazole (PROTONIX) 20 MG tablet Take 20 mg by mouth daily.  . QUEtiapine (SEROQUEL) 25 MG tablet Take 25 mg by mouth at bedtime. TAKE 1/2 TABLET  . traZODone (DESYREL) 100 MG tablet Take 1 tablet (100 mg total) by mouth at bedtime.  Marland Kitchen LORazepam (ATIVAN) 0.5 MG tablet Take 1 tablet (0.5 mg total) by mouth every 8 (eight) hours as needed for anxiety or sleep  (pt uses to help her sleep at night with trazadone). (Patient not taking: Reported on 03/20/2019)  . memantine (NAMENDA) 10 MG tablet   . methocarbamol (ROBAXIN) 500 MG tablet Take 250-500 mg by mouth every 6 (six) hours as needed (pain).  . [DISCONTINUED] esomeprazole (NEXIUM) 20 MG capsule TAKE 1 CAPSULE (20 MG TOTAL) BY MOUTH DAILY BEFORE BREAKFAST.   No facility-administered encounter medications on file as of 04/18/2019.     PHYSICAL EXAM / ROS:   97.6-78-18 110/68  Current and past weights: 141 lbs, gained 5 lbs from August. General: NAD, frail appearing, thin Cardiovascular: no chest pain reported, no edema Pulmonary: no cough, no increased SOB  Abdomen: appetite good, 75%, can feed self with set up, denies constipation, incontinent of bowel GU: denies dysuria, incontinent of urine MSK:  no joint deformities, non-ambulatory, bed to chair pivot transfer Skin: no rashes or wounds reported Neurological: Weakness, no behavior outbursts, verbal and interactive at times. FAST Stage 7a. Flat affect until I wished her a happy birthday, to which she smiled widely.   Jason Coop, NP

## 2019-04-19 DIAGNOSIS — Z23 Encounter for immunization: Secondary | ICD-10-CM | POA: Diagnosis not present

## 2019-04-19 DIAGNOSIS — R41841 Cognitive communication deficit: Secondary | ICD-10-CM | POA: Diagnosis not present

## 2019-04-19 DIAGNOSIS — M6281 Muscle weakness (generalized): Secondary | ICD-10-CM | POA: Diagnosis not present

## 2019-04-20 DIAGNOSIS — Z23 Encounter for immunization: Secondary | ICD-10-CM | POA: Diagnosis not present

## 2019-04-20 DIAGNOSIS — R41841 Cognitive communication deficit: Secondary | ICD-10-CM | POA: Diagnosis not present

## 2019-04-20 DIAGNOSIS — M6281 Muscle weakness (generalized): Secondary | ICD-10-CM | POA: Diagnosis not present

## 2019-04-21 DIAGNOSIS — Z23 Encounter for immunization: Secondary | ICD-10-CM | POA: Diagnosis not present

## 2019-04-21 DIAGNOSIS — M6281 Muscle weakness (generalized): Secondary | ICD-10-CM | POA: Diagnosis not present

## 2019-04-21 DIAGNOSIS — R41841 Cognitive communication deficit: Secondary | ICD-10-CM | POA: Diagnosis not present

## 2019-04-22 DIAGNOSIS — R41841 Cognitive communication deficit: Secondary | ICD-10-CM | POA: Diagnosis not present

## 2019-04-22 DIAGNOSIS — Z23 Encounter for immunization: Secondary | ICD-10-CM | POA: Diagnosis not present

## 2019-04-22 DIAGNOSIS — M6281 Muscle weakness (generalized): Secondary | ICD-10-CM | POA: Diagnosis not present

## 2019-04-23 DIAGNOSIS — Z03818 Encounter for observation for suspected exposure to other biological agents ruled out: Secondary | ICD-10-CM | POA: Diagnosis not present

## 2019-04-23 DIAGNOSIS — Z23 Encounter for immunization: Secondary | ICD-10-CM | POA: Diagnosis not present

## 2019-04-23 DIAGNOSIS — R41841 Cognitive communication deficit: Secondary | ICD-10-CM | POA: Diagnosis not present

## 2019-04-23 DIAGNOSIS — M6281 Muscle weakness (generalized): Secondary | ICD-10-CM | POA: Diagnosis not present

## 2019-04-25 DIAGNOSIS — R41841 Cognitive communication deficit: Secondary | ICD-10-CM | POA: Diagnosis not present

## 2019-04-25 DIAGNOSIS — M6281 Muscle weakness (generalized): Secondary | ICD-10-CM | POA: Diagnosis not present

## 2019-04-25 DIAGNOSIS — Z23 Encounter for immunization: Secondary | ICD-10-CM | POA: Diagnosis not present

## 2019-04-27 DIAGNOSIS — M6281 Muscle weakness (generalized): Secondary | ICD-10-CM | POA: Diagnosis not present

## 2019-04-27 DIAGNOSIS — Z23 Encounter for immunization: Secondary | ICD-10-CM | POA: Diagnosis not present

## 2019-04-27 DIAGNOSIS — R41841 Cognitive communication deficit: Secondary | ICD-10-CM | POA: Diagnosis not present

## 2019-04-28 DIAGNOSIS — M6281 Muscle weakness (generalized): Secondary | ICD-10-CM | POA: Diagnosis not present

## 2019-04-28 DIAGNOSIS — Z961 Presence of intraocular lens: Secondary | ICD-10-CM | POA: Diagnosis not present

## 2019-04-28 DIAGNOSIS — H04123 Dry eye syndrome of bilateral lacrimal glands: Secondary | ICD-10-CM | POA: Diagnosis not present

## 2019-04-28 DIAGNOSIS — F0390 Unspecified dementia without behavioral disturbance: Secondary | ICD-10-CM | POA: Diagnosis not present

## 2019-04-28 DIAGNOSIS — R41841 Cognitive communication deficit: Secondary | ICD-10-CM | POA: Diagnosis not present

## 2019-04-28 DIAGNOSIS — H43813 Vitreous degeneration, bilateral: Secondary | ICD-10-CM | POA: Diagnosis not present

## 2019-04-28 DIAGNOSIS — Z23 Encounter for immunization: Secondary | ICD-10-CM | POA: Diagnosis not present

## 2019-04-30 DIAGNOSIS — Z23 Encounter for immunization: Secondary | ICD-10-CM | POA: Diagnosis not present

## 2019-04-30 DIAGNOSIS — M6281 Muscle weakness (generalized): Secondary | ICD-10-CM | POA: Diagnosis not present

## 2019-04-30 DIAGNOSIS — R41841 Cognitive communication deficit: Secondary | ICD-10-CM | POA: Diagnosis not present

## 2019-04-30 DIAGNOSIS — Z03818 Encounter for observation for suspected exposure to other biological agents ruled out: Secondary | ICD-10-CM | POA: Diagnosis not present

## 2019-05-02 DIAGNOSIS — Z23 Encounter for immunization: Secondary | ICD-10-CM | POA: Diagnosis not present

## 2019-05-02 DIAGNOSIS — R41841 Cognitive communication deficit: Secondary | ICD-10-CM | POA: Diagnosis not present

## 2019-05-02 DIAGNOSIS — M6281 Muscle weakness (generalized): Secondary | ICD-10-CM | POA: Diagnosis not present

## 2019-05-03 DIAGNOSIS — Z23 Encounter for immunization: Secondary | ICD-10-CM | POA: Diagnosis not present

## 2019-05-03 DIAGNOSIS — M6281 Muscle weakness (generalized): Secondary | ICD-10-CM | POA: Diagnosis not present

## 2019-05-03 DIAGNOSIS — R41841 Cognitive communication deficit: Secondary | ICD-10-CM | POA: Diagnosis not present

## 2019-05-04 DIAGNOSIS — M6281 Muscle weakness (generalized): Secondary | ICD-10-CM | POA: Diagnosis not present

## 2019-05-04 DIAGNOSIS — Z23 Encounter for immunization: Secondary | ICD-10-CM | POA: Diagnosis not present

## 2019-05-04 DIAGNOSIS — R41841 Cognitive communication deficit: Secondary | ICD-10-CM | POA: Diagnosis not present

## 2019-05-05 DIAGNOSIS — R41841 Cognitive communication deficit: Secondary | ICD-10-CM | POA: Diagnosis not present

## 2019-05-05 DIAGNOSIS — Z23 Encounter for immunization: Secondary | ICD-10-CM | POA: Diagnosis not present

## 2019-05-05 DIAGNOSIS — M6281 Muscle weakness (generalized): Secondary | ICD-10-CM | POA: Diagnosis not present

## 2019-05-07 DIAGNOSIS — Z03818 Encounter for observation for suspected exposure to other biological agents ruled out: Secondary | ICD-10-CM | POA: Diagnosis not present

## 2019-05-07 DIAGNOSIS — M6281 Muscle weakness (generalized): Secondary | ICD-10-CM | POA: Diagnosis not present

## 2019-05-07 DIAGNOSIS — R41841 Cognitive communication deficit: Secondary | ICD-10-CM | POA: Diagnosis not present

## 2019-05-07 DIAGNOSIS — Z23 Encounter for immunization: Secondary | ICD-10-CM | POA: Diagnosis not present

## 2019-05-08 DIAGNOSIS — F329 Major depressive disorder, single episode, unspecified: Secondary | ICD-10-CM | POA: Diagnosis not present

## 2019-05-08 DIAGNOSIS — I6389 Other cerebral infarction: Secondary | ICD-10-CM | POA: Diagnosis not present

## 2019-05-08 DIAGNOSIS — Z23 Encounter for immunization: Secondary | ICD-10-CM | POA: Diagnosis not present

## 2019-05-08 DIAGNOSIS — F338 Other recurrent depressive disorders: Secondary | ICD-10-CM | POA: Diagnosis not present

## 2019-05-08 DIAGNOSIS — I10 Essential (primary) hypertension: Secondary | ICD-10-CM | POA: Diagnosis not present

## 2019-05-08 DIAGNOSIS — M6281 Muscle weakness (generalized): Secondary | ICD-10-CM | POA: Diagnosis not present

## 2019-05-08 DIAGNOSIS — F0390 Unspecified dementia without behavioral disturbance: Secondary | ICD-10-CM | POA: Diagnosis not present

## 2019-05-08 DIAGNOSIS — F0150 Vascular dementia without behavioral disturbance: Secondary | ICD-10-CM | POA: Diagnosis not present

## 2019-05-08 DIAGNOSIS — R41841 Cognitive communication deficit: Secondary | ICD-10-CM | POA: Diagnosis not present

## 2019-05-08 DIAGNOSIS — I69391 Dysphagia following cerebral infarction: Secondary | ICD-10-CM | POA: Diagnosis not present

## 2019-05-08 DIAGNOSIS — K219 Gastro-esophageal reflux disease without esophagitis: Secondary | ICD-10-CM | POA: Diagnosis not present

## 2019-05-08 DIAGNOSIS — G4700 Insomnia, unspecified: Secondary | ICD-10-CM | POA: Diagnosis not present

## 2019-05-08 DIAGNOSIS — R7303 Prediabetes: Secondary | ICD-10-CM | POA: Diagnosis not present

## 2019-05-08 DIAGNOSIS — S2249XA Multiple fractures of ribs, unspecified side, initial encounter for closed fracture: Secondary | ICD-10-CM | POA: Diagnosis not present

## 2019-05-09 DIAGNOSIS — R41841 Cognitive communication deficit: Secondary | ICD-10-CM | POA: Diagnosis not present

## 2019-05-09 DIAGNOSIS — M6281 Muscle weakness (generalized): Secondary | ICD-10-CM | POA: Diagnosis not present

## 2019-05-09 DIAGNOSIS — Z23 Encounter for immunization: Secondary | ICD-10-CM | POA: Diagnosis not present

## 2019-05-11 DIAGNOSIS — M6281 Muscle weakness (generalized): Secondary | ICD-10-CM | POA: Diagnosis not present

## 2019-05-11 DIAGNOSIS — R41841 Cognitive communication deficit: Secondary | ICD-10-CM | POA: Diagnosis not present

## 2019-05-11 DIAGNOSIS — Z23 Encounter for immunization: Secondary | ICD-10-CM | POA: Diagnosis not present

## 2019-05-12 DIAGNOSIS — M6281 Muscle weakness (generalized): Secondary | ICD-10-CM | POA: Diagnosis not present

## 2019-05-12 DIAGNOSIS — R41841 Cognitive communication deficit: Secondary | ICD-10-CM | POA: Diagnosis not present

## 2019-05-12 DIAGNOSIS — Z23 Encounter for immunization: Secondary | ICD-10-CM | POA: Diagnosis not present

## 2019-05-14 DIAGNOSIS — R41841 Cognitive communication deficit: Secondary | ICD-10-CM | POA: Diagnosis not present

## 2019-05-14 DIAGNOSIS — Z23 Encounter for immunization: Secondary | ICD-10-CM | POA: Diagnosis not present

## 2019-05-14 DIAGNOSIS — Z03818 Encounter for observation for suspected exposure to other biological agents ruled out: Secondary | ICD-10-CM | POA: Diagnosis not present

## 2019-05-14 DIAGNOSIS — M6281 Muscle weakness (generalized): Secondary | ICD-10-CM | POA: Diagnosis not present

## 2019-05-15 DIAGNOSIS — R41841 Cognitive communication deficit: Secondary | ICD-10-CM | POA: Diagnosis not present

## 2019-05-15 DIAGNOSIS — M6281 Muscle weakness (generalized): Secondary | ICD-10-CM | POA: Diagnosis not present

## 2019-05-15 DIAGNOSIS — Z23 Encounter for immunization: Secondary | ICD-10-CM | POA: Diagnosis not present

## 2019-05-16 DIAGNOSIS — M6281 Muscle weakness (generalized): Secondary | ICD-10-CM | POA: Diagnosis not present

## 2019-05-16 DIAGNOSIS — Z23 Encounter for immunization: Secondary | ICD-10-CM | POA: Diagnosis not present

## 2019-05-16 DIAGNOSIS — R41841 Cognitive communication deficit: Secondary | ICD-10-CM | POA: Diagnosis not present

## 2019-05-16 DIAGNOSIS — M160 Bilateral primary osteoarthritis of hip: Secondary | ICD-10-CM | POA: Diagnosis not present

## 2019-05-16 DIAGNOSIS — M4856XA Collapsed vertebra, not elsewhere classified, lumbar region, initial encounter for fracture: Secondary | ICD-10-CM | POA: Diagnosis not present

## 2019-05-18 DIAGNOSIS — R41841 Cognitive communication deficit: Secondary | ICD-10-CM | POA: Diagnosis not present

## 2019-05-18 DIAGNOSIS — M6281 Muscle weakness (generalized): Secondary | ICD-10-CM | POA: Diagnosis not present

## 2019-05-18 DIAGNOSIS — Z23 Encounter for immunization: Secondary | ICD-10-CM | POA: Diagnosis not present

## 2019-05-19 DIAGNOSIS — R41841 Cognitive communication deficit: Secondary | ICD-10-CM | POA: Diagnosis not present

## 2019-05-19 DIAGNOSIS — M6281 Muscle weakness (generalized): Secondary | ICD-10-CM | POA: Diagnosis not present

## 2019-05-19 DIAGNOSIS — Z23 Encounter for immunization: Secondary | ICD-10-CM | POA: Diagnosis not present

## 2019-05-20 DIAGNOSIS — S32020A Wedge compression fracture of second lumbar vertebra, initial encounter for closed fracture: Secondary | ICD-10-CM | POA: Diagnosis not present

## 2019-05-20 DIAGNOSIS — F0151 Vascular dementia with behavioral disturbance: Secondary | ICD-10-CM | POA: Diagnosis not present

## 2019-05-21 DIAGNOSIS — M6281 Muscle weakness (generalized): Secondary | ICD-10-CM | POA: Diagnosis not present

## 2019-05-21 DIAGNOSIS — R41841 Cognitive communication deficit: Secondary | ICD-10-CM | POA: Diagnosis not present

## 2019-05-21 DIAGNOSIS — Z03818 Encounter for observation for suspected exposure to other biological agents ruled out: Secondary | ICD-10-CM | POA: Diagnosis not present

## 2019-05-21 DIAGNOSIS — Z23 Encounter for immunization: Secondary | ICD-10-CM | POA: Diagnosis not present

## 2019-05-22 DIAGNOSIS — F0390 Unspecified dementia without behavioral disturbance: Secondary | ICD-10-CM | POA: Diagnosis not present

## 2019-05-22 DIAGNOSIS — G4700 Insomnia, unspecified: Secondary | ICD-10-CM | POA: Diagnosis not present

## 2019-05-22 DIAGNOSIS — F05 Delirium due to known physiological condition: Secondary | ICD-10-CM | POA: Diagnosis not present

## 2019-05-22 DIAGNOSIS — F329 Major depressive disorder, single episode, unspecified: Secondary | ICD-10-CM | POA: Diagnosis not present

## 2019-05-23 DIAGNOSIS — Z23 Encounter for immunization: Secondary | ICD-10-CM | POA: Diagnosis not present

## 2019-05-23 DIAGNOSIS — R41841 Cognitive communication deficit: Secondary | ICD-10-CM | POA: Diagnosis not present

## 2019-05-23 DIAGNOSIS — M6281 Muscle weakness (generalized): Secondary | ICD-10-CM | POA: Diagnosis not present

## 2019-05-24 DIAGNOSIS — Z23 Encounter for immunization: Secondary | ICD-10-CM | POA: Diagnosis not present

## 2019-05-24 DIAGNOSIS — M6281 Muscle weakness (generalized): Secondary | ICD-10-CM | POA: Diagnosis not present

## 2019-05-24 DIAGNOSIS — R41841 Cognitive communication deficit: Secondary | ICD-10-CM | POA: Diagnosis not present

## 2019-05-25 DIAGNOSIS — R41841 Cognitive communication deficit: Secondary | ICD-10-CM | POA: Diagnosis not present

## 2019-05-25 DIAGNOSIS — M6281 Muscle weakness (generalized): Secondary | ICD-10-CM | POA: Diagnosis not present

## 2019-05-25 DIAGNOSIS — Z23 Encounter for immunization: Secondary | ICD-10-CM | POA: Diagnosis not present

## 2019-05-26 DIAGNOSIS — R41841 Cognitive communication deficit: Secondary | ICD-10-CM | POA: Diagnosis not present

## 2019-05-26 DIAGNOSIS — Z23 Encounter for immunization: Secondary | ICD-10-CM | POA: Diagnosis not present

## 2019-05-26 DIAGNOSIS — M6281 Muscle weakness (generalized): Secondary | ICD-10-CM | POA: Diagnosis not present

## 2019-05-27 DIAGNOSIS — Z23 Encounter for immunization: Secondary | ICD-10-CM | POA: Diagnosis not present

## 2019-05-27 DIAGNOSIS — R41841 Cognitive communication deficit: Secondary | ICD-10-CM | POA: Diagnosis not present

## 2019-05-27 DIAGNOSIS — M6281 Muscle weakness (generalized): Secondary | ICD-10-CM | POA: Diagnosis not present

## 2019-05-28 DIAGNOSIS — Z03818 Encounter for observation for suspected exposure to other biological agents ruled out: Secondary | ICD-10-CM | POA: Diagnosis not present

## 2019-05-30 DIAGNOSIS — R41841 Cognitive communication deficit: Secondary | ICD-10-CM | POA: Diagnosis not present

## 2019-05-30 DIAGNOSIS — Z23 Encounter for immunization: Secondary | ICD-10-CM | POA: Diagnosis not present

## 2019-05-30 DIAGNOSIS — M6281 Muscle weakness (generalized): Secondary | ICD-10-CM | POA: Diagnosis not present

## 2019-05-31 DIAGNOSIS — M6281 Muscle weakness (generalized): Secondary | ICD-10-CM | POA: Diagnosis not present

## 2019-05-31 DIAGNOSIS — R41841 Cognitive communication deficit: Secondary | ICD-10-CM | POA: Diagnosis not present

## 2019-05-31 DIAGNOSIS — Z23 Encounter for immunization: Secondary | ICD-10-CM | POA: Diagnosis not present

## 2019-06-01 DIAGNOSIS — M6281 Muscle weakness (generalized): Secondary | ICD-10-CM | POA: Diagnosis not present

## 2019-06-01 DIAGNOSIS — R41841 Cognitive communication deficit: Secondary | ICD-10-CM | POA: Diagnosis not present

## 2019-06-01 DIAGNOSIS — Z23 Encounter for immunization: Secondary | ICD-10-CM | POA: Diagnosis not present

## 2019-06-02 DIAGNOSIS — M6281 Muscle weakness (generalized): Secondary | ICD-10-CM | POA: Diagnosis not present

## 2019-06-02 DIAGNOSIS — R41841 Cognitive communication deficit: Secondary | ICD-10-CM | POA: Diagnosis not present

## 2019-06-02 DIAGNOSIS — Z23 Encounter for immunization: Secondary | ICD-10-CM | POA: Diagnosis not present

## 2019-06-03 DIAGNOSIS — Z03818 Encounter for observation for suspected exposure to other biological agents ruled out: Secondary | ICD-10-CM | POA: Diagnosis not present

## 2019-06-04 DIAGNOSIS — M6281 Muscle weakness (generalized): Secondary | ICD-10-CM | POA: Diagnosis not present

## 2019-06-04 DIAGNOSIS — R41841 Cognitive communication deficit: Secondary | ICD-10-CM | POA: Diagnosis not present

## 2019-06-05 ENCOUNTER — Non-Acute Institutional Stay: Payer: Self-pay | Admitting: Primary Care

## 2019-06-05 ENCOUNTER — Other Ambulatory Visit: Payer: Self-pay

## 2019-06-05 DIAGNOSIS — Z515 Encounter for palliative care: Secondary | ICD-10-CM | POA: Diagnosis not present

## 2019-06-05 DIAGNOSIS — I10 Essential (primary) hypertension: Secondary | ICD-10-CM | POA: Diagnosis not present

## 2019-06-05 DIAGNOSIS — M545 Low back pain: Secondary | ICD-10-CM | POA: Diagnosis not present

## 2019-06-05 NOTE — Progress Notes (Signed)
Therapist, nutritional Palliative Care Consult Note Telephone: [phone]  Fax: [phone]  TELEHEALTH VISIT STATEMENT Due to the COVID-19 crisis, this visit was done via telemedicine from my office. It was initiated and consented to by this patient and/or family.  PATIENT NAME: Jill Hancock [address] [place] [address] [phone] (home)  DOB: [birth year] MRN: [id number]  PRIMARY CARE PROVIDER:   Keane Police, MD, [phone] [address] [place] [address] [phone]  REFERRING PROVIDER:  Keane Police, MD [phone] [place],  [place] [address] [phone]  RESPONSIBLE PARTY:   Extended Emergency Contact Information Primary Emergency Contact: Justine Null [place] Mobile Phone: [phone] Relation: Daughter Secondary Emergency Contact: Rodena Goldmann Mobile Phone: [phone] Relation: Daughter   ASSESSMENT AND RECOMMENDATIONS:   1. Advance Care Planning/Goals of Care: Goals include to maximize quality of life and symptom management. DNR on record. POA is [employer].  I spoke with her by phone today to discuss concerns or questions. She has requested fall mats to be placed on all sides of patient bed, and to have bed in lowest position. MOST needs to be completed. Will discuss on next visit and upload with daughter per remote access.   2. Symptom Management:   Pain: States no pain at rest but much pain when she moves.  Recommend Tramadol 50 mg tid to qid, plus Senna-S 2 tabs bid. Needs pain med for crying, s/p fracture of L2. Currently on ATC acetaminophen and meloxicam but her btp is significant.  Safety: Recommend fall mats around bed and bed in lowest elevation. Reported slide out of bed with subsequent L 2 fx.  Daughter states h/o compression fracture. Also reiterated desire for mats with daughter and this is her wish as well.   Nutrition: Eats well with meal set up. Weights  stable, actually gaining some weight.  3. Family /Caregiver/Community Supports: Daughter is Chartered certified accountant, lives in LTC  4. Cognitive / Functional decline: Alert but appears uncomfortable on video exam. Funcationally has had fall and sustained L 2 fx. Able to perform own feeding, brushing teeth but needs help with all other adls and ialds.  5. Follow up Palliative Care Visit: Palliative care will continue to follow for goals of care clarification and symptom management. Return 4 weeks or prn.  I spent 35 minutes providing this consultation,  from 1100 to 1135. More than 50% of the time in this consultation was spent coordinating communication.   HISTORY OF PRESENT ILLNESS:  Jill Hancock is a 78 y.o. year old female with multiple medical problems including dementia FAST 6E-7A, osteoporosis, vertebral fracture. Palliative Care was asked to follow this patient by consultation request of Slade-Hartman, Alvino Chapel* to help address advance care planning and goals of care. This is a follow up visit.  CODE STATUS: DNR  PPS: 30% HOSPICE ELIGIBILITY/DIAGNOSIS: TBD  PAST MEDICAL HISTORY:  Past Medical History:  Diagnosis Date  . Anxiety   . Depression   . Hyperglycemia   . Hypertension   . Hypokalemia   . Osteoporosis   . Stroke [facility]) 02/2012   MRI revealed at least 3 subcentimeter acute infarctions with one area of subacute infarction in widely disparate vascular territories including territory of left cerebellum and around right caudate nucleus along with widespread lacunar infarcts, chronic microvascular ischemic change, and numerous microbleeds suggesting long standing hypertensive cerebrovascular disease.  MRA confirmed intracranial  . Unsteady gait   . Vertigo     SOCIAL HX:  Social History   Tobacco Use  . Smoking status: Never Smoker  . Smokeless tobacco: Never Used  Substance Use Topics  . Alcohol use: No    Alcohol/week: 0.0 standard drinks    ALLERGIES:  Allergies  Allergen  Reactions  . Demerol [Meperidine] Nausea And Vomiting  . Macrobid [Nitrofurantoin Macrocrystal] Rash  . Penicillins Rash    Has patient had a PCN reaction causing immediate rash, facial/tongue/throat swelling, SOB or lightheadedness with hypotension: Unknown Has patient had a PCN reaction causing severe rash involving mucus membranes or skin necrosis: Unknown Has patient had a PCN reaction that required hospitalization: Unknown Has patient had a PCN reaction occurring within the last 10 years: No If all of the above answers are "NO", then may proceed with Cephalosporin use.      PERTINENT MEDICATIONS:  Outpatient Encounter Medications as of 06/05/2019  Medication Sig  . acetaminophen (TYLENOL) 325 MG tablet Take 2 tablets (650 mg total) by mouth every 4 (four) hours as needed for mild pain, moderate pain or fever (or temp > 37.5 C (99.5 F)).  Marland Kitchen alendronate (FOSAMAX) 70 MG tablet Take 1 tablet (70 mg total) by mouth every Monday.  Marland Kitchen amLODipine (NORVASC) 10 MG tablet TAKE 1 TABLET BY MOUTH EVERY DAY (Patient taking differently: Take 10 mg by mouth at bedtime. )  . aspirin 81 MG tablet Take 1 tablet (81 mg total) by mouth daily. (Patient taking differently: Take 81 mg by mouth at bedtime. )  . atorvastatin (LIPITOR) 40 MG tablet Take 1 tablet (40 mg total) by mouth daily at 6 PM. (Patient taking differently: Take 40 mg by mouth at bedtime. )  . Cholecalciferol (VITAMIN D3) 50 MCG (2000 UT) TABS Take 2,000 Units by mouth daily.  . clopidogrel (PLAVIX) 75 MG tablet Take 1 tablet (75 mg total) by mouth daily.  . divalproex (DEPAKOTE SPRINKLE) 125 MG capsule   . docusate sodium (COLACE) 100 MG capsule Take 1 capsule (100 mg total) by mouth 2 (two) times daily.  . feeding supplement, ENSURE ENLIVE, (ENSURE ENLIVE) LIQD Take 237 mLs by mouth 2 (two) times daily between meals.  Marland Kitchen FLUoxetine (PROZAC) 20 MG capsule   . lisinopril (PRINIVIL,ZESTRIL) 40 MG tablet Take 1 tablet (40 mg total) by mouth daily.  (Patient taking differently: Take 40 mg by mouth at bedtime. )  . LORazepam (ATIVAN) 0.5 MG tablet Take 1 tablet (0.5 mg total) by mouth every 8 (eight) hours as needed for anxiety or sleep (pt uses to help her sleep at night with trazadone). (Patient not taking: Reported on 03/20/2019)  . Melatonin 5 MG CAPS Take 1 capsule (5 mg total) by mouth at bedtime.  . meloxicam (MOBIC) 7.5 MG tablet   . memantine (NAMENDA) 10 MG tablet   . methocarbamol (ROBAXIN) 500 MG tablet Take 250-500 mg by mouth every 6 (six) hours as needed (pain).  . Multiple Vitamins-Minerals (CERTAVITE/ANTIOXIDANTS) TABS Take by mouth.  . pantoprazole (PROTONIX) 20 MG tablet Take 20 mg by mouth daily.  . QUEtiapine (SEROQUEL) 25 MG tablet Take 25 mg by mouth at bedtime. TAKE 1/2 TABLET  . traZODone (DESYREL) 100 MG tablet Take 1 tablet (100 mg total) by mouth at bedtime.  . [DISCONTINUED] esomeprazole (NEXIUM) 20 MG capsule TAKE 1 CAPSULE (20 MG TOTAL) BY MOUTH DAILY BEFORE BREAKFAST.   No facility-administered encounter medications on file as of 06/05/2019.     PHYSICAL EXAM / ROS:   97.3  64-18  117/65   Current and past weights:  142.6 lb., has gained some weight 5-7 lbs. General: NAD, frail appearing, thin. Pain complaint by crying, reporting pain,  reporting but can't choose pain number. Cardiovascular: no chest pain reported, no edema Pulmonary: no cough, no increased SOB, 93% room air Abdomen: appetite good, denies constipation, continent of bowel GU: denies dysuria, continent of urine MSK:  no joint deformities, ambulatory, fell out of bed on non skidproof socks. Fx L2 traumatic fracture, 05/15/2019. Skin: no rashes or wounds reported Neurological: Weakness,  Mood at baseline, has had no behaviors since moving to private room.   Eliezer LoftsKathryn McKelvey Orbie Grupe, NP

## 2019-06-06 DIAGNOSIS — M6281 Muscle weakness (generalized): Secondary | ICD-10-CM | POA: Diagnosis not present

## 2019-06-06 DIAGNOSIS — R41841 Cognitive communication deficit: Secondary | ICD-10-CM | POA: Diagnosis not present

## 2019-06-07 DIAGNOSIS — M6281 Muscle weakness (generalized): Secondary | ICD-10-CM | POA: Diagnosis not present

## 2019-06-07 DIAGNOSIS — R41841 Cognitive communication deficit: Secondary | ICD-10-CM | POA: Diagnosis not present

## 2019-06-08 DIAGNOSIS — R41841 Cognitive communication deficit: Secondary | ICD-10-CM | POA: Diagnosis not present

## 2019-06-08 DIAGNOSIS — M6281 Muscle weakness (generalized): Secondary | ICD-10-CM | POA: Diagnosis not present

## 2019-06-09 DIAGNOSIS — Z515 Encounter for palliative care: Secondary | ICD-10-CM | POA: Diagnosis not present

## 2019-06-09 DIAGNOSIS — S72142D Displaced intertrochanteric fracture of left femur, subsequent encounter for closed fracture with routine healing: Secondary | ICD-10-CM | POA: Diagnosis not present

## 2019-06-09 DIAGNOSIS — F0150 Vascular dementia without behavioral disturbance: Secondary | ICD-10-CM | POA: Diagnosis not present

## 2019-06-09 DIAGNOSIS — G8929 Other chronic pain: Secondary | ICD-10-CM | POA: Diagnosis not present

## 2019-06-09 DIAGNOSIS — F338 Other recurrent depressive disorders: Secondary | ICD-10-CM | POA: Diagnosis not present

## 2019-06-09 DIAGNOSIS — M6281 Muscle weakness (generalized): Secondary | ICD-10-CM | POA: Diagnosis not present

## 2019-06-09 DIAGNOSIS — R41841 Cognitive communication deficit: Secondary | ICD-10-CM | POA: Diagnosis not present

## 2019-06-10 DIAGNOSIS — M6281 Muscle weakness (generalized): Secondary | ICD-10-CM | POA: Diagnosis not present

## 2019-06-10 DIAGNOSIS — R41841 Cognitive communication deficit: Secondary | ICD-10-CM | POA: Diagnosis not present

## 2019-06-11 DIAGNOSIS — M6281 Muscle weakness (generalized): Secondary | ICD-10-CM | POA: Diagnosis not present

## 2019-06-11 DIAGNOSIS — R41841 Cognitive communication deficit: Secondary | ICD-10-CM | POA: Diagnosis not present

## 2019-06-11 DIAGNOSIS — Z03818 Encounter for observation for suspected exposure to other biological agents ruled out: Secondary | ICD-10-CM | POA: Diagnosis not present

## 2019-06-12 DIAGNOSIS — R41841 Cognitive communication deficit: Secondary | ICD-10-CM | POA: Diagnosis not present

## 2019-06-12 DIAGNOSIS — M6281 Muscle weakness (generalized): Secondary | ICD-10-CM | POA: Diagnosis not present

## 2019-06-13 DIAGNOSIS — R41841 Cognitive communication deficit: Secondary | ICD-10-CM | POA: Diagnosis not present

## 2019-06-13 DIAGNOSIS — M6281 Muscle weakness (generalized): Secondary | ICD-10-CM | POA: Diagnosis not present

## 2019-06-16 DIAGNOSIS — R41841 Cognitive communication deficit: Secondary | ICD-10-CM | POA: Diagnosis not present

## 2019-06-16 DIAGNOSIS — M6281 Muscle weakness (generalized): Secondary | ICD-10-CM | POA: Diagnosis not present

## 2019-06-17 DIAGNOSIS — R41841 Cognitive communication deficit: Secondary | ICD-10-CM | POA: Diagnosis not present

## 2019-06-17 DIAGNOSIS — M6281 Muscle weakness (generalized): Secondary | ICD-10-CM | POA: Diagnosis not present

## 2019-06-18 DIAGNOSIS — R41841 Cognitive communication deficit: Secondary | ICD-10-CM | POA: Diagnosis not present

## 2019-06-18 DIAGNOSIS — M6281 Muscle weakness (generalized): Secondary | ICD-10-CM | POA: Diagnosis not present

## 2019-06-19 DIAGNOSIS — R41841 Cognitive communication deficit: Secondary | ICD-10-CM | POA: Diagnosis not present

## 2019-06-19 DIAGNOSIS — M6281 Muscle weakness (generalized): Secondary | ICD-10-CM | POA: Diagnosis not present

## 2019-06-20 DIAGNOSIS — M6281 Muscle weakness (generalized): Secondary | ICD-10-CM | POA: Diagnosis not present

## 2019-06-20 DIAGNOSIS — R41841 Cognitive communication deficit: Secondary | ICD-10-CM | POA: Diagnosis not present

## 2019-06-21 DIAGNOSIS — M6281 Muscle weakness (generalized): Secondary | ICD-10-CM | POA: Diagnosis not present

## 2019-06-21 DIAGNOSIS — R41841 Cognitive communication deficit: Secondary | ICD-10-CM | POA: Diagnosis not present

## 2019-06-23 DIAGNOSIS — R41841 Cognitive communication deficit: Secondary | ICD-10-CM | POA: Diagnosis not present

## 2019-06-23 DIAGNOSIS — M6281 Muscle weakness (generalized): Secondary | ICD-10-CM | POA: Diagnosis not present

## 2019-06-24 DIAGNOSIS — M6281 Muscle weakness (generalized): Secondary | ICD-10-CM | POA: Diagnosis not present

## 2019-06-24 DIAGNOSIS — R41841 Cognitive communication deficit: Secondary | ICD-10-CM | POA: Diagnosis not present

## 2019-06-25 DIAGNOSIS — M6281 Muscle weakness (generalized): Secondary | ICD-10-CM | POA: Diagnosis not present

## 2019-06-25 DIAGNOSIS — R41841 Cognitive communication deficit: Secondary | ICD-10-CM | POA: Diagnosis not present

## 2019-06-26 DIAGNOSIS — Z03818 Encounter for observation for suspected exposure to other biological agents ruled out: Secondary | ICD-10-CM | POA: Diagnosis not present

## 2019-06-26 DIAGNOSIS — M6281 Muscle weakness (generalized): Secondary | ICD-10-CM | POA: Diagnosis not present

## 2019-06-26 DIAGNOSIS — R41841 Cognitive communication deficit: Secondary | ICD-10-CM | POA: Diagnosis not present

## 2019-06-29 DIAGNOSIS — R41841 Cognitive communication deficit: Secondary | ICD-10-CM | POA: Diagnosis not present

## 2019-06-29 DIAGNOSIS — M6281 Muscle weakness (generalized): Secondary | ICD-10-CM | POA: Diagnosis not present

## 2019-06-30 DIAGNOSIS — Z03818 Encounter for observation for suspected exposure to other biological agents ruled out: Secondary | ICD-10-CM | POA: Diagnosis not present

## 2019-06-30 DIAGNOSIS — R41841 Cognitive communication deficit: Secondary | ICD-10-CM | POA: Diagnosis not present

## 2019-06-30 DIAGNOSIS — M6281 Muscle weakness (generalized): Secondary | ICD-10-CM | POA: Diagnosis not present

## 2019-07-01 DIAGNOSIS — R41841 Cognitive communication deficit: Secondary | ICD-10-CM | POA: Diagnosis not present

## 2019-07-01 DIAGNOSIS — M6281 Muscle weakness (generalized): Secondary | ICD-10-CM | POA: Diagnosis not present

## 2019-07-02 ENCOUNTER — Non-Acute Institutional Stay: Payer: Self-pay | Admitting: Primary Care

## 2019-07-02 ENCOUNTER — Other Ambulatory Visit: Payer: Self-pay

## 2019-07-02 DIAGNOSIS — Z515 Encounter for palliative care: Secondary | ICD-10-CM | POA: Diagnosis not present

## 2019-07-02 DIAGNOSIS — M6281 Muscle weakness (generalized): Secondary | ICD-10-CM | POA: Diagnosis not present

## 2019-07-02 DIAGNOSIS — I6389 Other cerebral infarction: Secondary | ICD-10-CM | POA: Diagnosis not present

## 2019-07-02 DIAGNOSIS — S2249XA Multiple fractures of ribs, unspecified side, initial encounter for closed fracture: Secondary | ICD-10-CM | POA: Diagnosis not present

## 2019-07-02 DIAGNOSIS — R41841 Cognitive communication deficit: Secondary | ICD-10-CM | POA: Diagnosis not present

## 2019-07-02 DIAGNOSIS — I639 Cerebral infarction, unspecified: Secondary | ICD-10-CM | POA: Diagnosis not present

## 2019-07-02 DIAGNOSIS — S72142D Displaced intertrochanteric fracture of left femur, subsequent encounter for closed fracture with routine healing: Secondary | ICD-10-CM | POA: Diagnosis not present

## 2019-07-02 NOTE — Progress Notes (Signed)
Therapist, nutritional Palliative Care Consult Note Telephone: [phone]  Fax: [phone]  TELEHEALTH VISIT STATEMENT Due to the COVID-19 crisis, this visit was done via telemedicine from my office. It was initiated and consented to by this patient and/or family.  PATIENT NAME: Jill Hancock [address] [place] [address] [phone] (home)  DOB: January 19, 1941 MRN: [id number]  PRIMARY CARE PROVIDER:   Keane Police, MD, [phone] [place] [address] [phone]  REFERRING PROVIDER:  Keane Police, MD [phone] [address] [place],  [place] [address] [phone]  RESPONSIBLE PARTY:   Extended Emergency Contact Information Primary Emergency Contact: Justine Null [place] Mobile Phone: [phone] Relation: Daughter Secondary Emergency Contact: Rodena Goldmann Mobile Phone: [phone] Relation: Daughter   ASSESSMENT AND RECOMMENDATIONS:   1. Advance Care Planning/Goals of Care: Goals include to maximize quality of life and symptom management. DNR on record  2. Symptom Management:   Pain: Has tramadol 50 mg tid prn, for L2 fx. Has Aspercreme from this fall also for pain. ATC Acetaminophen and meloxicam 7.5 mg  for pain. Appears more comfortable with these interventions. Patient is able to ask for prn and states she needs it less now.  Fall risk: No more falls, has bedside mats around the bed. She is encouraged to call for help to get up.  3. Family /Caregiver/Community Supports: Daughter is POA  And supportive, interacts with her regularly. Lives in LTC.  4. Cognitive / Functional decline: Alert, O x 2, forgetful at times. Able to get OOB with help, perform some personal adls. Needs help with all iadls.  5. Follow up Palliative Care Visit: Palliative care will continue to follow for goals of care clarification and symptom management. Return 4-6 weeks or prn.  I spent 25 minutes providing  this consultation,  from 1130 to 1155. More than 50% of the time in this consultation was spent coordinating communication.   HISTORY OF PRESENT ILLNESS:  Jill Hancock is a 78 y.o. year old female with multiple medical problems including dementia FAST 6E-7A, osteoporosis, vertebral fracture. Palliative Care was asked to follow this patient by consultation request of Slade-Hartman, Alvino Chapel* to help address advance care planning and goals of care. This is a follow up visit.  CODE STATUS: DNR  PPS: 30% HOSPICE ELIGIBILITY/DIAGNOSIS: TBD  PAST MEDICAL HISTORY:  Past Medical History:  Diagnosis Date  . Anxiety   . Depression   . Hyperglycemia   . Hypertension   . Hypokalemia   . Osteoporosis   . Stroke [facility]) 02/2012   MRI revealed at least 3 subcentimeter acute infarctions with one area of subacute infarction in widely disparate vascular territories including territory of left cerebellum and around right caudate nucleus along with widespread lacunar infarcts, chronic microvascular ischemic change, and numerous microbleeds suggesting long standing hypertensive cerebrovascular disease.  MRA confirmed intracranial  . Unsteady gait   . Vertigo     SOCIAL HX:  Social History   Tobacco Use  . Smoking status: Never Smoker  . Smokeless tobacco: Never Used  Substance Use Topics  . Alcohol use: No    Alcohol/week: 0.0 standard drinks    ALLERGIES:  Allergies  Allergen Reactions  . Demerol [Meperidine] Nausea And Vomiting  . Macrobid [Nitrofurantoin Macrocrystal] Rash  . Penicillins Rash    Has patient had a PCN reaction causing immediate rash, facial/tongue/throat swelling, SOB or lightheadedness with hypotension: Unknown Has patient had a PCN reaction causing severe rash involving mucus  membranes or skin necrosis: Unknown Has patient had a PCN reaction that required hospitalization: Unknown Has patient had a PCN reaction occurring within the last 10 years: No If all of the above  answers are "NO", then may proceed with Cephalosporin use.      PERTINENT MEDICATIONS:  Outpatient Encounter Medications as of 07/02/2019  Medication Sig  . acetaminophen (TYLENOL) 325 MG tablet Take 2 tablets (650 mg total) by mouth every 4 (four) hours as needed for mild pain, moderate pain or fever (or temp > 37.5 C (99.5 F)).  Marland Kitchen alendronate (FOSAMAX) 70 MG tablet Take 1 tablet (70 mg total) by mouth every Monday.  Marland Kitchen amLODipine (NORVASC) 10 MG tablet TAKE 1 TABLET BY MOUTH EVERY DAY (Patient taking differently: Take 10 mg by mouth at bedtime. )  . aspirin 81 MG tablet Take 1 tablet (81 mg total) by mouth daily. (Patient taking differently: Take 81 mg by mouth at bedtime. )  . atorvastatin (LIPITOR) 40 MG tablet Take 1 tablet (40 mg total) by mouth daily at 6 PM. (Patient taking differently: Take 40 mg by mouth at bedtime. )  . Cholecalciferol (VITAMIN D3) 50 MCG (2000 UT) TABS Take 2,000 Units by mouth daily.  . clopidogrel (PLAVIX) 75 MG tablet Take 1 tablet (75 mg total) by mouth daily.  . divalproex (DEPAKOTE SPRINKLE) 125 MG capsule   . docusate sodium (COLACE) 100 MG capsule Take 1 capsule (100 mg total) by mouth 2 (two) times daily.  . feeding supplement, ENSURE ENLIVE, (ENSURE ENLIVE) LIQD Take 237 mLs by mouth 2 (two) times daily between meals.  Marland Kitchen FLUoxetine (PROZAC) 20 MG capsule   . lisinopril (PRINIVIL,ZESTRIL) 40 MG tablet Take 1 tablet (40 mg total) by mouth daily. (Patient taking differently: Take 40 mg by mouth at bedtime. )  . LORazepam (ATIVAN) 0.5 MG tablet Take 1 tablet (0.5 mg total) by mouth every 8 (eight) hours as needed for anxiety or sleep (pt uses to help her sleep at night with trazadone). (Patient not taking: Reported on 03/20/2019)  . Melatonin 5 MG CAPS Take 1 capsule (5 mg total) by mouth at bedtime.  . meloxicam (MOBIC) 7.5 MG tablet   . memantine (NAMENDA) 10 MG tablet   . methocarbamol (ROBAXIN) 500 MG tablet Take 250-500 mg by mouth every 6 (six) hours as  needed (pain).  . Multiple Vitamins-Minerals (CERTAVITE/ANTIOXIDANTS) TABS Take by mouth.  . pantoprazole (PROTONIX) 20 MG tablet Take 20 mg by mouth daily.  . QUEtiapine (SEROQUEL) 25 MG tablet Take 25 mg by mouth at bedtime. TAKE 1/2 TABLET  . traZODone (DESYREL) 100 MG tablet Take 1 tablet (100 mg total) by mouth at bedtime.  . [DISCONTINUED] esomeprazole (NEXIUM) 20 MG capsule TAKE 1 CAPSULE (20 MG TOTAL) BY MOUTH DAILY BEFORE BREAKFAST.   No facility-administered encounter medications on file as of 07/02/2019.    PHYSICAL EXAM / ROS:   Current and past weights: 142 lbs. General: NAD, frail appearing, no distress, no pain Cardiovascular: no chest pain reported, no edema Pulmonary: no cough, no increased SOB, Room air Abdomen: appetite good denies constipation, continent of bowel GU: denies dysuria, continent of urine MSK: L2 fx still healing, ambulatory with help Skin: no rashes or wounds reported Neurological: Weakness, calm and relaxed, states she feels well.  Jason Coop, NP , [facility]

## 2019-07-03 DIAGNOSIS — R41841 Cognitive communication deficit: Secondary | ICD-10-CM | POA: Diagnosis not present

## 2019-07-03 DIAGNOSIS — M6281 Muscle weakness (generalized): Secondary | ICD-10-CM | POA: Diagnosis not present

## 2019-07-04 DIAGNOSIS — R41841 Cognitive communication deficit: Secondary | ICD-10-CM | POA: Diagnosis not present

## 2019-07-04 DIAGNOSIS — M6281 Muscle weakness (generalized): Secondary | ICD-10-CM | POA: Diagnosis not present

## 2019-07-05 DIAGNOSIS — M6281 Muscle weakness (generalized): Secondary | ICD-10-CM | POA: Diagnosis not present

## 2019-07-05 DIAGNOSIS — R41841 Cognitive communication deficit: Secondary | ICD-10-CM | POA: Diagnosis not present

## 2019-07-07 DIAGNOSIS — M6281 Muscle weakness (generalized): Secondary | ICD-10-CM | POA: Diagnosis not present

## 2019-07-07 DIAGNOSIS — Z03818 Encounter for observation for suspected exposure to other biological agents ruled out: Secondary | ICD-10-CM | POA: Diagnosis not present

## 2019-07-07 DIAGNOSIS — R41841 Cognitive communication deficit: Secondary | ICD-10-CM | POA: Diagnosis not present

## 2019-07-08 DIAGNOSIS — M6281 Muscle weakness (generalized): Secondary | ICD-10-CM | POA: Diagnosis not present

## 2019-07-08 DIAGNOSIS — R41841 Cognitive communication deficit: Secondary | ICD-10-CM | POA: Diagnosis not present

## 2019-07-09 DIAGNOSIS — M6281 Muscle weakness (generalized): Secondary | ICD-10-CM | POA: Diagnosis not present

## 2019-07-09 DIAGNOSIS — R41841 Cognitive communication deficit: Secondary | ICD-10-CM | POA: Diagnosis not present

## 2019-07-10 DIAGNOSIS — R41841 Cognitive communication deficit: Secondary | ICD-10-CM | POA: Diagnosis not present

## 2019-07-10 DIAGNOSIS — M6281 Muscle weakness (generalized): Secondary | ICD-10-CM | POA: Diagnosis not present

## 2019-07-11 DIAGNOSIS — M6281 Muscle weakness (generalized): Secondary | ICD-10-CM | POA: Diagnosis not present

## 2019-07-11 DIAGNOSIS — R41841 Cognitive communication deficit: Secondary | ICD-10-CM | POA: Diagnosis not present

## 2019-07-12 DIAGNOSIS — R41841 Cognitive communication deficit: Secondary | ICD-10-CM | POA: Diagnosis not present

## 2019-07-12 DIAGNOSIS — M6281 Muscle weakness (generalized): Secondary | ICD-10-CM | POA: Diagnosis not present

## 2019-07-14 DIAGNOSIS — R41841 Cognitive communication deficit: Secondary | ICD-10-CM | POA: Diagnosis not present

## 2019-07-14 DIAGNOSIS — M6281 Muscle weakness (generalized): Secondary | ICD-10-CM | POA: Diagnosis not present

## 2019-07-14 DIAGNOSIS — Z03818 Encounter for observation for suspected exposure to other biological agents ruled out: Secondary | ICD-10-CM | POA: Diagnosis not present

## 2019-07-15 DIAGNOSIS — R41841 Cognitive communication deficit: Secondary | ICD-10-CM | POA: Diagnosis not present

## 2019-07-15 DIAGNOSIS — M6281 Muscle weakness (generalized): Secondary | ICD-10-CM | POA: Diagnosis not present

## 2019-07-16 DIAGNOSIS — M6281 Muscle weakness (generalized): Secondary | ICD-10-CM | POA: Diagnosis not present

## 2019-07-16 DIAGNOSIS — R41841 Cognitive communication deficit: Secondary | ICD-10-CM | POA: Diagnosis not present

## 2019-07-17 DIAGNOSIS — F0390 Unspecified dementia without behavioral disturbance: Secondary | ICD-10-CM | POA: Diagnosis not present

## 2019-07-17 DIAGNOSIS — F329 Major depressive disorder, single episode, unspecified: Secondary | ICD-10-CM | POA: Diagnosis not present

## 2019-07-17 DIAGNOSIS — F05 Delirium due to known physiological condition: Secondary | ICD-10-CM | POA: Diagnosis not present

## 2019-07-17 DIAGNOSIS — R41841 Cognitive communication deficit: Secondary | ICD-10-CM | POA: Diagnosis not present

## 2019-07-17 DIAGNOSIS — G4701 Insomnia due to medical condition: Secondary | ICD-10-CM | POA: Diagnosis not present

## 2019-07-17 DIAGNOSIS — M6281 Muscle weakness (generalized): Secondary | ICD-10-CM | POA: Diagnosis not present

## 2019-07-18 DIAGNOSIS — M6281 Muscle weakness (generalized): Secondary | ICD-10-CM | POA: Diagnosis not present

## 2019-07-18 DIAGNOSIS — R41841 Cognitive communication deficit: Secondary | ICD-10-CM | POA: Diagnosis not present

## 2019-07-21 DIAGNOSIS — R41841 Cognitive communication deficit: Secondary | ICD-10-CM | POA: Diagnosis not present

## 2019-07-21 DIAGNOSIS — M6281 Muscle weakness (generalized): Secondary | ICD-10-CM | POA: Diagnosis not present

## 2019-07-21 DIAGNOSIS — Z03818 Encounter for observation for suspected exposure to other biological agents ruled out: Secondary | ICD-10-CM | POA: Diagnosis not present

## 2019-07-22 DIAGNOSIS — R41841 Cognitive communication deficit: Secondary | ICD-10-CM | POA: Diagnosis not present

## 2019-07-22 DIAGNOSIS — M6281 Muscle weakness (generalized): Secondary | ICD-10-CM | POA: Diagnosis not present

## 2019-07-23 DIAGNOSIS — R41841 Cognitive communication deficit: Secondary | ICD-10-CM | POA: Diagnosis not present

## 2019-07-23 DIAGNOSIS — M6281 Muscle weakness (generalized): Secondary | ICD-10-CM | POA: Diagnosis not present

## 2019-07-24 ENCOUNTER — Other Ambulatory Visit
Admission: RE | Admit: 2019-07-24 | Discharge: 2019-07-24 | Disposition: A | Discharge status: Home / Self Care | Payer: Medicare HMO | Source: Ambulatory Visit | Attending: Family Medicine | Admitting: Family Medicine

## 2019-07-24 DIAGNOSIS — R208 Other disturbances of skin sensation: Secondary | ICD-10-CM | POA: Insufficient documentation

## 2019-07-24 DIAGNOSIS — R41841 Cognitive communication deficit: Secondary | ICD-10-CM | POA: Diagnosis not present

## 2019-07-24 DIAGNOSIS — R109 Unspecified abdominal pain: Secondary | ICD-10-CM | POA: Diagnosis not present

## 2019-07-24 DIAGNOSIS — R300 Dysuria: Secondary | ICD-10-CM | POA: Diagnosis not present

## 2019-07-24 DIAGNOSIS — M6281 Muscle weakness (generalized): Secondary | ICD-10-CM | POA: Diagnosis not present

## 2019-07-24 LAB — URINALYSIS, COMPLETE (UACMP) WITH MICROSCOPIC
Bilirubin Urine: NEGATIVE
Glucose, UA: NEGATIVE mg/dL
Ketones, ur: 5 mg/dL — AB
Nitrite: NEGATIVE
Protein, ur: 100 mg/dL — AB
Specific Gravity, Urine: 1.018 (ref 1.005–1.030)
Squamous Epithelial / HPF: NONE SEEN (ref 0–5)
pH: 7 (ref 5.0–8.0)

## 2019-07-25 DIAGNOSIS — M6281 Muscle weakness (generalized): Secondary | ICD-10-CM | POA: Diagnosis not present

## 2019-07-25 DIAGNOSIS — R41841 Cognitive communication deficit: Secondary | ICD-10-CM | POA: Diagnosis not present

## 2019-07-27 DIAGNOSIS — M6281 Muscle weakness (generalized): Secondary | ICD-10-CM | POA: Diagnosis not present

## 2019-07-27 DIAGNOSIS — R41841 Cognitive communication deficit: Secondary | ICD-10-CM | POA: Diagnosis not present

## 2019-07-27 LAB — URINE CULTURE: Culture: >=100000 — AB

## 2019-07-28 DIAGNOSIS — Z03818 Encounter for observation for suspected exposure to other biological agents ruled out: Secondary | ICD-10-CM | POA: Diagnosis not present

## 2019-07-28 DIAGNOSIS — R300 Dysuria: Secondary | ICD-10-CM | POA: Diagnosis not present

## 2019-07-28 DIAGNOSIS — M6281 Muscle weakness (generalized): Secondary | ICD-10-CM | POA: Diagnosis not present

## 2019-07-28 DIAGNOSIS — R41841 Cognitive communication deficit: Secondary | ICD-10-CM | POA: Diagnosis not present

## 2019-07-29 DIAGNOSIS — R41841 Cognitive communication deficit: Secondary | ICD-10-CM | POA: Diagnosis not present

## 2019-07-29 DIAGNOSIS — M6281 Muscle weakness (generalized): Secondary | ICD-10-CM | POA: Diagnosis not present

## 2019-07-30 DIAGNOSIS — M6281 Muscle weakness (generalized): Secondary | ICD-10-CM | POA: Diagnosis not present

## 2019-07-30 DIAGNOSIS — R41841 Cognitive communication deficit: Secondary | ICD-10-CM | POA: Diagnosis not present

## 2019-07-31 DIAGNOSIS — M6281 Muscle weakness (generalized): Secondary | ICD-10-CM | POA: Diagnosis not present

## 2019-07-31 DIAGNOSIS — R41841 Cognitive communication deficit: Secondary | ICD-10-CM | POA: Diagnosis not present

## 2019-08-01 DIAGNOSIS — R41841 Cognitive communication deficit: Secondary | ICD-10-CM | POA: Diagnosis not present

## 2019-08-01 DIAGNOSIS — M6281 Muscle weakness (generalized): Secondary | ICD-10-CM | POA: Diagnosis not present

## 2019-08-02 DIAGNOSIS — M6281 Muscle weakness (generalized): Secondary | ICD-10-CM | POA: Diagnosis not present

## 2019-08-02 DIAGNOSIS — R41841 Cognitive communication deficit: Secondary | ICD-10-CM | POA: Diagnosis not present

## 2019-08-04 DIAGNOSIS — R41841 Cognitive communication deficit: Secondary | ICD-10-CM | POA: Diagnosis not present

## 2019-08-04 DIAGNOSIS — M6281 Muscle weakness (generalized): Secondary | ICD-10-CM | POA: Diagnosis not present

## 2019-08-04 DIAGNOSIS — Z03818 Encounter for observation for suspected exposure to other biological agents ruled out: Secondary | ICD-10-CM | POA: Diagnosis not present

## 2019-08-05 DIAGNOSIS — R41841 Cognitive communication deficit: Secondary | ICD-10-CM | POA: Diagnosis not present

## 2019-08-05 DIAGNOSIS — M6281 Muscle weakness (generalized): Secondary | ICD-10-CM | POA: Diagnosis not present

## 2019-08-06 DIAGNOSIS — M6281 Muscle weakness (generalized): Secondary | ICD-10-CM | POA: Diagnosis not present

## 2019-08-06 DIAGNOSIS — R41841 Cognitive communication deficit: Secondary | ICD-10-CM | POA: Diagnosis not present

## 2019-08-07 DIAGNOSIS — R41841 Cognitive communication deficit: Secondary | ICD-10-CM | POA: Diagnosis not present

## 2019-08-07 DIAGNOSIS — M6281 Muscle weakness (generalized): Secondary | ICD-10-CM | POA: Diagnosis not present

## 2019-08-08 DIAGNOSIS — M6281 Muscle weakness (generalized): Secondary | ICD-10-CM | POA: Diagnosis not present

## 2019-08-08 DIAGNOSIS — R41841 Cognitive communication deficit: Secondary | ICD-10-CM | POA: Diagnosis not present

## 2019-08-09 DIAGNOSIS — R41841 Cognitive communication deficit: Secondary | ICD-10-CM | POA: Diagnosis not present

## 2019-08-09 DIAGNOSIS — M6281 Muscle weakness (generalized): Secondary | ICD-10-CM | POA: Diagnosis not present

## 2019-08-11 DIAGNOSIS — M6281 Muscle weakness (generalized): Secondary | ICD-10-CM | POA: Diagnosis not present

## 2019-08-11 DIAGNOSIS — Z03818 Encounter for observation for suspected exposure to other biological agents ruled out: Secondary | ICD-10-CM | POA: Diagnosis not present

## 2019-08-11 DIAGNOSIS — R41841 Cognitive communication deficit: Secondary | ICD-10-CM | POA: Diagnosis not present

## 2019-08-12 DIAGNOSIS — R41841 Cognitive communication deficit: Secondary | ICD-10-CM | POA: Diagnosis not present

## 2019-08-12 DIAGNOSIS — M6281 Muscle weakness (generalized): Secondary | ICD-10-CM | POA: Diagnosis not present

## 2019-08-13 DIAGNOSIS — M6281 Muscle weakness (generalized): Secondary | ICD-10-CM | POA: Diagnosis not present

## 2019-08-13 DIAGNOSIS — R41841 Cognitive communication deficit: Secondary | ICD-10-CM | POA: Diagnosis not present

## 2019-08-14 DIAGNOSIS — M6281 Muscle weakness (generalized): Secondary | ICD-10-CM | POA: Diagnosis not present

## 2019-08-14 DIAGNOSIS — R41841 Cognitive communication deficit: Secondary | ICD-10-CM | POA: Diagnosis not present

## 2019-08-15 DIAGNOSIS — M6281 Muscle weakness (generalized): Secondary | ICD-10-CM | POA: Diagnosis not present

## 2019-08-15 DIAGNOSIS — R41841 Cognitive communication deficit: Secondary | ICD-10-CM | POA: Diagnosis not present

## 2019-08-17 DIAGNOSIS — R41841 Cognitive communication deficit: Secondary | ICD-10-CM | POA: Diagnosis not present

## 2019-08-17 DIAGNOSIS — M6281 Muscle weakness (generalized): Secondary | ICD-10-CM | POA: Diagnosis not present

## 2019-08-18 DIAGNOSIS — R41841 Cognitive communication deficit: Secondary | ICD-10-CM | POA: Diagnosis not present

## 2019-08-18 DIAGNOSIS — M6281 Muscle weakness (generalized): Secondary | ICD-10-CM | POA: Diagnosis not present

## 2019-08-18 DIAGNOSIS — Z03818 Encounter for observation for suspected exposure to other biological agents ruled out: Secondary | ICD-10-CM | POA: Diagnosis not present

## 2019-08-19 DIAGNOSIS — M6281 Muscle weakness (generalized): Secondary | ICD-10-CM | POA: Diagnosis not present

## 2019-08-19 DIAGNOSIS — R41841 Cognitive communication deficit: Secondary | ICD-10-CM | POA: Diagnosis not present

## 2019-08-20 ENCOUNTER — Non-Acute Institutional Stay: Payer: Self-pay | Admitting: Primary Care

## 2019-08-20 ENCOUNTER — Other Ambulatory Visit: Payer: Self-pay

## 2019-08-20 DIAGNOSIS — Z515 Encounter for palliative care: Secondary | ICD-10-CM | POA: Diagnosis not present

## 2019-08-20 DIAGNOSIS — M6281 Muscle weakness (generalized): Secondary | ICD-10-CM | POA: Diagnosis not present

## 2019-08-20 DIAGNOSIS — R41841 Cognitive communication deficit: Secondary | ICD-10-CM | POA: Diagnosis not present

## 2019-08-20 NOTE — Progress Notes (Signed)
Therapist, nutritional Palliative Care Consult Note Telephone: [phone]  Fax: [phone]  TELEHEALTH VISIT STATEMENT Due to the COVID-19 crisis, this visit was done via telemedicine from my office. It was initiated and consented to by this patient and/or family.  PATIENT NAME: Jill Hancock [address] [place] [address] [phone] (home)  DOB: [birth year] MRN: [id number]  PRIMARY CARE PROVIDER:   Keane Police, MD, [phone] [place] [address] [phone]  REFERRING PROVIDER:  Keane Police, MD [phone] [place],  [place] [address] [phone]  RESPONSIBLE PARTY:   Extended Emergency Contact Information Primary Emergency Contact: Justine Null [place] of [place] Mobile Phone: [phone] Relation: Daughter Secondary Emergency Contact: Rodena Goldmann Mobile Phone: [phone] Relation: Daughter   ASSESSMENT AND RECOMMENDATIONS:   1. Advance Care Planning/Goals of Care: Goals include to maximize quality of life and symptom management. Has DNR on record. T/c [name]. No answer, message left.   2. Symptom Management:   Pruritis: Severe pruritis, awoke from hs. Relieved by benadryl. Recommend prn hydroxyzine for future events.  Pain: Is not a problem now, fractures from fall in Dec. Seem to be resolved or at least no longer painful. Appears comfortable today.  Fall Risk: Tries to get up still but has gotten weaker and has falls. Has pads on floor. Cannot ambulate, pivots with max assist.  Nutrition: Drinks sodas but does not eat large amounts. Eats 50% or less. Has lost 4 lbs in 30 days.  3. Family /Caregiver/Community Supports:  Daughters are [employer]. Lives in LTC.  4. Cognitive / Functional decline: Alert, O x 2, forgetful at times. Able to get transfer with max assist, perform some personal adls. Needs help with all iadls.  5. Follow up Palliative Care Visit: Palliative care will  continue to follow for goals of care clarification and symptom management. Return 6-8 weeks or prn.  I spent 25 minutes providing this consultation,  from 1030 to 1055. More than 50% of the time in this consultation was spent coordinating communication.   HISTORY OF PRESENT ILLNESS:  Jill Hancock is a 79 y.o. year old female with multiple medical problems including dementia FAST 6E-7A, osteoporosis, vertebral fracture. Palliative Care was asked to follow this patient by consultation request of Slade-Hartman, Alvino Chapel* to help address advance care planning and goals of care. This is a follow up visit.  CODE STATUS: DNR  PPS: 30% HOSPICE ELIGIBILITY/DIAGNOSIS: TBD  PAST MEDICAL HISTORY:  Past Medical History:  Diagnosis Date  . Anxiety   . Depression   . Hyperglycemia   . Hypertension   . Hypokalemia   . Osteoporosis   . Stroke [facility]) 02/2012   MRI revealed at least 3 subcentimeter acute infarctions with one area of subacute infarction in widely disparate vascular territories including territory of left cerebellum and around right caudate nucleus along with widespread lacunar infarcts, chronic microvascular ischemic change, and numerous microbleeds suggesting long standing hypertensive cerebrovascular disease.  MRA confirmed intracranial  . Unsteady gait   . Vertigo     SOCIAL HX:  Social History   Tobacco Use  . Smoking status: Never Smoker  . Smokeless tobacco: Never Used  Substance Use Topics  . Alcohol use: No    Alcohol/week: 0.0 standard drinks    ALLERGIES:  Allergies  Allergen Reactions  . Demerol [Meperidine] Nausea And Vomiting  . Macrobid [Nitrofurantoin Macrocrystal] Rash  . Penicillins Rash    Has patient had a PCN reaction causing  immediate rash, facial/tongue/throat swelling, SOB or lightheadedness with hypotension: Unknown Has patient had a PCN reaction causing severe rash involving mucus membranes or skin necrosis: Unknown Has patient had a PCN reaction  that required hospitalization: Unknown Has patient had a PCN reaction occurring within the last 10 years: No If all of the above answers are "NO", then may proceed with Cephalosporin use.      PERTINENT MEDICATIONS:  Outpatient Encounter Medications as of 08/20/2019  Medication Sig  . acetaminophen (TYLENOL) 325 MG tablet Take 2 tablets (650 mg total) by mouth every 4 (four) hours as needed for mild pain, moderate pain or fever (or temp > 37.5 C (99.5 F)).  Marland Kitchen alendronate (FOSAMAX) 70 MG tablet Take 1 tablet (70 mg total) by mouth every Monday.  Marland Kitchen amLODipine (NORVASC) 10 MG tablet TAKE 1 TABLET BY MOUTH EVERY DAY (Patient taking differently: Take 10 mg by mouth at bedtime. )  . aspirin 81 MG tablet Take 1 tablet (81 mg total) by mouth daily. (Patient taking differently: Take 81 mg by mouth at bedtime. )  . atorvastatin (LIPITOR) 40 MG tablet Take 1 tablet (40 mg total) by mouth daily at 6 PM. (Patient taking differently: Take 40 mg by mouth at bedtime. )  . Cholecalciferol (VITAMIN D3) 50 MCG (2000 UT) TABS Take 2,000 Units by mouth daily.  . clopidogrel (PLAVIX) 75 MG tablet Take 1 tablet (75 mg total) by mouth daily.  . divalproex (DEPAKOTE SPRINKLE) 125 MG capsule   . docusate sodium (COLACE) 100 MG capsule Take 1 capsule (100 mg total) by mouth 2 (two) times daily.  . feeding supplement, ENSURE ENLIVE, (ENSURE ENLIVE) LIQD Take 237 mLs by mouth 2 (two) times daily between meals.  Marland Kitchen FLUoxetine (PROZAC) 20 MG capsule   . lisinopril (PRINIVIL,ZESTRIL) 40 MG tablet Take 1 tablet (40 mg total) by mouth daily. (Patient taking differently: Take 40 mg by mouth at bedtime. )  . LORazepam (ATIVAN) 0.5 MG tablet Take 1 tablet (0.5 mg total) by mouth every 8 (eight) hours as needed for anxiety or sleep (pt uses to help her sleep at night with trazadone). (Patient not taking: Reported on 03/20/2019)  . Melatonin 5 MG CAPS Take 1 capsule (5 mg total) by mouth at bedtime.  . meloxicam (MOBIC) 7.5 MG tablet     . memantine (NAMENDA) 10 MG tablet   . methocarbamol (ROBAXIN) 500 MG tablet Take 250-500 mg by mouth every 6 (six) hours as needed (pain).  . Multiple Vitamins-Minerals (CERTAVITE/ANTIOXIDANTS) TABS Take by mouth.  . pantoprazole (PROTONIX) 20 MG tablet Take 20 mg by mouth daily.  . QUEtiapine (SEROQUEL) 25 MG tablet Take 25 mg by mouth at bedtime. TAKE 1/2 TABLET  . traZODone (DESYREL) 100 MG tablet Take 1 tablet (100 mg total) by mouth at bedtime.  . [DISCONTINUED] esomeprazole (NEXIUM) 20 MG capsule TAKE 1 CAPSULE (20 MG TOTAL) BY MOUTH DAILY BEFORE BREAKFAST.   No facility-administered encounter medications on file as of 08/20/2019.    PHYSICAL EXAM / ROS:   97.0 63-18 109/60 Current and past weights: 138.4 lbs; Weight fluctuates about 5-8 lbs. Was 142 in December but 137 in August 2020.  General: NAD, frail appearing, thin Cardiovascular: no chest pain reported, no edema Pulmonary: no cough, no SOB, room air Abdomen: appetite fair, 50%, denies constipation, incontinent of bowel GU: denies dysuria, incontinent of urine MSK:  no joint deformities, non ambulatory, up in wheel chair Skin: no rashes or wounds reported, severe pruritis, relieved by benedryl  Neurological: Weakness, alert, oriented, able to converse, yes/no answers mainly  Jason Coop, NP [facility]

## 2019-08-21 DIAGNOSIS — L298 Other pruritus: Secondary | ICD-10-CM | POA: Diagnosis not present

## 2019-08-21 DIAGNOSIS — R41841 Cognitive communication deficit: Secondary | ICD-10-CM | POA: Diagnosis not present

## 2019-08-21 DIAGNOSIS — M6281 Muscle weakness (generalized): Secondary | ICD-10-CM | POA: Diagnosis not present

## 2019-08-22 DIAGNOSIS — M6281 Muscle weakness (generalized): Secondary | ICD-10-CM | POA: Diagnosis not present

## 2019-08-22 DIAGNOSIS — R41841 Cognitive communication deficit: Secondary | ICD-10-CM | POA: Diagnosis not present

## 2019-08-24 DIAGNOSIS — R41841 Cognitive communication deficit: Secondary | ICD-10-CM | POA: Diagnosis not present

## 2019-08-24 DIAGNOSIS — M6281 Muscle weakness (generalized): Secondary | ICD-10-CM | POA: Diagnosis not present

## 2019-08-25 DIAGNOSIS — M6281 Muscle weakness (generalized): Secondary | ICD-10-CM | POA: Diagnosis not present

## 2019-08-25 DIAGNOSIS — R41841 Cognitive communication deficit: Secondary | ICD-10-CM | POA: Diagnosis not present

## 2019-08-26 DIAGNOSIS — R41841 Cognitive communication deficit: Secondary | ICD-10-CM | POA: Diagnosis not present

## 2019-08-26 DIAGNOSIS — M6281 Muscle weakness (generalized): Secondary | ICD-10-CM | POA: Diagnosis not present

## 2019-08-27 DIAGNOSIS — M6281 Muscle weakness (generalized): Secondary | ICD-10-CM | POA: Diagnosis not present

## 2019-08-27 DIAGNOSIS — R41841 Cognitive communication deficit: Secondary | ICD-10-CM | POA: Diagnosis not present

## 2019-08-28 DIAGNOSIS — Z03818 Encounter for observation for suspected exposure to other biological agents ruled out: Secondary | ICD-10-CM | POA: Diagnosis not present

## 2019-08-28 DIAGNOSIS — R41841 Cognitive communication deficit: Secondary | ICD-10-CM | POA: Diagnosis not present

## 2019-08-28 DIAGNOSIS — M6281 Muscle weakness (generalized): Secondary | ICD-10-CM | POA: Diagnosis not present

## 2019-08-29 DIAGNOSIS — M6281 Muscle weakness (generalized): Secondary | ICD-10-CM | POA: Diagnosis not present

## 2019-08-29 DIAGNOSIS — R41841 Cognitive communication deficit: Secondary | ICD-10-CM | POA: Diagnosis not present

## 2019-08-31 DIAGNOSIS — M6281 Muscle weakness (generalized): Secondary | ICD-10-CM | POA: Diagnosis not present

## 2019-08-31 DIAGNOSIS — R41841 Cognitive communication deficit: Secondary | ICD-10-CM | POA: Diagnosis not present

## 2019-09-01 DIAGNOSIS — M6281 Muscle weakness (generalized): Secondary | ICD-10-CM | POA: Diagnosis not present

## 2019-09-01 DIAGNOSIS — R41841 Cognitive communication deficit: Secondary | ICD-10-CM | POA: Diagnosis not present

## 2019-09-02 DIAGNOSIS — M6281 Muscle weakness (generalized): Secondary | ICD-10-CM | POA: Diagnosis not present

## 2019-09-02 DIAGNOSIS — R41841 Cognitive communication deficit: Secondary | ICD-10-CM | POA: Diagnosis not present

## 2019-09-03 DIAGNOSIS — R41841 Cognitive communication deficit: Secondary | ICD-10-CM | POA: Diagnosis not present

## 2019-09-03 DIAGNOSIS — M6281 Muscle weakness (generalized): Secondary | ICD-10-CM | POA: Diagnosis not present

## 2019-09-04 DIAGNOSIS — S2249XA Multiple fractures of ribs, unspecified side, initial encounter for closed fracture: Secondary | ICD-10-CM | POA: Diagnosis not present

## 2019-09-04 DIAGNOSIS — R41841 Cognitive communication deficit: Secondary | ICD-10-CM | POA: Diagnosis not present

## 2019-09-04 DIAGNOSIS — I10 Essential (primary) hypertension: Secondary | ICD-10-CM | POA: Diagnosis not present

## 2019-09-04 DIAGNOSIS — F338 Other recurrent depressive disorders: Secondary | ICD-10-CM | POA: Diagnosis not present

## 2019-09-04 DIAGNOSIS — F418 Other specified anxiety disorders: Secondary | ICD-10-CM | POA: Diagnosis not present

## 2019-09-04 DIAGNOSIS — F0151 Vascular dementia with behavioral disturbance: Secondary | ICD-10-CM | POA: Diagnosis not present

## 2019-09-04 DIAGNOSIS — I6389 Other cerebral infarction: Secondary | ICD-10-CM | POA: Diagnosis not present

## 2019-09-04 DIAGNOSIS — Z03818 Encounter for observation for suspected exposure to other biological agents ruled out: Secondary | ICD-10-CM | POA: Diagnosis not present

## 2019-09-04 DIAGNOSIS — K219 Gastro-esophageal reflux disease without esophagitis: Secondary | ICD-10-CM | POA: Diagnosis not present

## 2019-09-04 DIAGNOSIS — M6281 Muscle weakness (generalized): Secondary | ICD-10-CM | POA: Diagnosis not present

## 2019-09-04 DIAGNOSIS — I69391 Dysphagia following cerebral infarction: Secondary | ICD-10-CM | POA: Diagnosis not present

## 2019-09-04 DIAGNOSIS — R338 Other retention of urine: Secondary | ICD-10-CM | POA: Diagnosis not present

## 2019-09-05 DIAGNOSIS — R41841 Cognitive communication deficit: Secondary | ICD-10-CM | POA: Diagnosis not present

## 2019-09-05 DIAGNOSIS — M6281 Muscle weakness (generalized): Secondary | ICD-10-CM | POA: Diagnosis not present

## 2019-09-08 DIAGNOSIS — R41841 Cognitive communication deficit: Secondary | ICD-10-CM | POA: Diagnosis not present

## 2019-09-08 DIAGNOSIS — M6281 Muscle weakness (generalized): Secondary | ICD-10-CM | POA: Diagnosis not present

## 2019-09-09 DIAGNOSIS — M6281 Muscle weakness (generalized): Secondary | ICD-10-CM | POA: Diagnosis not present

## 2019-09-09 DIAGNOSIS — Z79899 Other long term (current) drug therapy: Secondary | ICD-10-CM | POA: Diagnosis not present

## 2019-09-09 DIAGNOSIS — D649 Anemia, unspecified: Secondary | ICD-10-CM | POA: Diagnosis not present

## 2019-09-09 DIAGNOSIS — R41841 Cognitive communication deficit: Secondary | ICD-10-CM | POA: Diagnosis not present

## 2019-09-10 DIAGNOSIS — M6281 Muscle weakness (generalized): Secondary | ICD-10-CM | POA: Diagnosis not present

## 2019-09-10 DIAGNOSIS — R41841 Cognitive communication deficit: Secondary | ICD-10-CM | POA: Diagnosis not present

## 2019-09-11 DIAGNOSIS — R41841 Cognitive communication deficit: Secondary | ICD-10-CM | POA: Diagnosis not present

## 2019-09-11 DIAGNOSIS — M6281 Muscle weakness (generalized): Secondary | ICD-10-CM | POA: Diagnosis not present

## 2019-09-12 DIAGNOSIS — R41841 Cognitive communication deficit: Secondary | ICD-10-CM | POA: Diagnosis not present

## 2019-09-12 DIAGNOSIS — M6281 Muscle weakness (generalized): Secondary | ICD-10-CM | POA: Diagnosis not present

## 2019-09-15 DIAGNOSIS — M6281 Muscle weakness (generalized): Secondary | ICD-10-CM | POA: Diagnosis not present

## 2019-09-15 DIAGNOSIS — R41841 Cognitive communication deficit: Secondary | ICD-10-CM | POA: Diagnosis not present

## 2019-09-16 DIAGNOSIS — M6281 Muscle weakness (generalized): Secondary | ICD-10-CM | POA: Diagnosis not present

## 2019-09-16 DIAGNOSIS — R41841 Cognitive communication deficit: Secondary | ICD-10-CM | POA: Diagnosis not present

## 2019-09-17 DIAGNOSIS — M6281 Muscle weakness (generalized): Secondary | ICD-10-CM | POA: Diagnosis not present

## 2019-09-17 DIAGNOSIS — R41841 Cognitive communication deficit: Secondary | ICD-10-CM | POA: Diagnosis not present

## 2019-09-18 DIAGNOSIS — R41841 Cognitive communication deficit: Secondary | ICD-10-CM | POA: Diagnosis not present

## 2019-09-18 DIAGNOSIS — M6281 Muscle weakness (generalized): Secondary | ICD-10-CM | POA: Diagnosis not present

## 2019-09-19 DIAGNOSIS — R41841 Cognitive communication deficit: Secondary | ICD-10-CM | POA: Diagnosis not present

## 2019-09-19 DIAGNOSIS — M6281 Muscle weakness (generalized): Secondary | ICD-10-CM | POA: Diagnosis not present

## 2019-09-22 DIAGNOSIS — M6281 Muscle weakness (generalized): Secondary | ICD-10-CM | POA: Diagnosis not present

## 2019-09-22 DIAGNOSIS — R41841 Cognitive communication deficit: Secondary | ICD-10-CM | POA: Diagnosis not present

## 2019-09-23 DIAGNOSIS — R41841 Cognitive communication deficit: Secondary | ICD-10-CM | POA: Diagnosis not present

## 2019-09-23 DIAGNOSIS — M6281 Muscle weakness (generalized): Secondary | ICD-10-CM | POA: Diagnosis not present

## 2019-09-24 DIAGNOSIS — F05 Delirium due to known physiological condition: Secondary | ICD-10-CM | POA: Diagnosis not present

## 2019-09-24 DIAGNOSIS — G4701 Insomnia due to medical condition: Secondary | ICD-10-CM | POA: Diagnosis not present

## 2019-09-24 DIAGNOSIS — R41841 Cognitive communication deficit: Secondary | ICD-10-CM | POA: Diagnosis not present

## 2019-09-24 DIAGNOSIS — F329 Major depressive disorder, single episode, unspecified: Secondary | ICD-10-CM | POA: Diagnosis not present

## 2019-09-24 DIAGNOSIS — M6281 Muscle weakness (generalized): Secondary | ICD-10-CM | POA: Diagnosis not present

## 2019-09-24 DIAGNOSIS — G4700 Insomnia, unspecified: Secondary | ICD-10-CM | POA: Diagnosis not present

## 2019-09-25 DIAGNOSIS — R41841 Cognitive communication deficit: Secondary | ICD-10-CM | POA: Diagnosis not present

## 2019-09-25 DIAGNOSIS — M6281 Muscle weakness (generalized): Secondary | ICD-10-CM | POA: Diagnosis not present

## 2019-09-26 DIAGNOSIS — M6281 Muscle weakness (generalized): Secondary | ICD-10-CM | POA: Diagnosis not present

## 2019-09-26 DIAGNOSIS — R41841 Cognitive communication deficit: Secondary | ICD-10-CM | POA: Diagnosis not present

## 2019-09-29 DIAGNOSIS — R41841 Cognitive communication deficit: Secondary | ICD-10-CM | POA: Diagnosis not present

## 2019-09-29 DIAGNOSIS — M6281 Muscle weakness (generalized): Secondary | ICD-10-CM | POA: Diagnosis not present

## 2019-09-30 DIAGNOSIS — R41841 Cognitive communication deficit: Secondary | ICD-10-CM | POA: Diagnosis not present

## 2019-09-30 DIAGNOSIS — M6281 Muscle weakness (generalized): Secondary | ICD-10-CM | POA: Diagnosis not present

## 2019-10-01 DIAGNOSIS — M6281 Muscle weakness (generalized): Secondary | ICD-10-CM | POA: Diagnosis not present

## 2019-10-01 DIAGNOSIS — R41841 Cognitive communication deficit: Secondary | ICD-10-CM | POA: Diagnosis not present

## 2019-10-02 DIAGNOSIS — M6281 Muscle weakness (generalized): Secondary | ICD-10-CM | POA: Diagnosis not present

## 2019-10-02 DIAGNOSIS — R41841 Cognitive communication deficit: Secondary | ICD-10-CM | POA: Diagnosis not present

## 2019-10-03 DIAGNOSIS — R41841 Cognitive communication deficit: Secondary | ICD-10-CM | POA: Diagnosis not present

## 2019-10-03 DIAGNOSIS — M6281 Muscle weakness (generalized): Secondary | ICD-10-CM | POA: Diagnosis not present

## 2019-10-06 DIAGNOSIS — R41841 Cognitive communication deficit: Secondary | ICD-10-CM | POA: Diagnosis not present

## 2019-10-06 DIAGNOSIS — M6281 Muscle weakness (generalized): Secondary | ICD-10-CM | POA: Diagnosis not present

## 2019-10-07 DIAGNOSIS — M6281 Muscle weakness (generalized): Secondary | ICD-10-CM | POA: Diagnosis not present

## 2019-10-07 DIAGNOSIS — R41841 Cognitive communication deficit: Secondary | ICD-10-CM | POA: Diagnosis not present

## 2019-10-08 DIAGNOSIS — R41841 Cognitive communication deficit: Secondary | ICD-10-CM | POA: Diagnosis not present

## 2019-10-08 DIAGNOSIS — M6281 Muscle weakness (generalized): Secondary | ICD-10-CM | POA: Diagnosis not present

## 2019-10-09 DIAGNOSIS — M6281 Muscle weakness (generalized): Secondary | ICD-10-CM | POA: Diagnosis not present

## 2019-10-09 DIAGNOSIS — R41841 Cognitive communication deficit: Secondary | ICD-10-CM | POA: Diagnosis not present

## 2019-10-10 DIAGNOSIS — R41841 Cognitive communication deficit: Secondary | ICD-10-CM | POA: Diagnosis not present

## 2019-10-10 DIAGNOSIS — M6281 Muscle weakness (generalized): Secondary | ICD-10-CM | POA: Diagnosis not present

## 2019-10-13 DIAGNOSIS — M6281 Muscle weakness (generalized): Secondary | ICD-10-CM | POA: Diagnosis not present

## 2019-10-13 DIAGNOSIS — R41841 Cognitive communication deficit: Secondary | ICD-10-CM | POA: Diagnosis not present

## 2019-10-14 DIAGNOSIS — M6281 Muscle weakness (generalized): Secondary | ICD-10-CM | POA: Diagnosis not present

## 2019-10-14 DIAGNOSIS — R41841 Cognitive communication deficit: Secondary | ICD-10-CM | POA: Diagnosis not present

## 2019-10-15 ENCOUNTER — Non-Acute Institutional Stay: Payer: Self-pay | Admitting: Primary Care

## 2019-10-15 ENCOUNTER — Other Ambulatory Visit: Payer: Self-pay

## 2019-10-15 DIAGNOSIS — Z515 Encounter for palliative care: Secondary | ICD-10-CM | POA: Diagnosis not present

## 2019-10-15 NOTE — Progress Notes (Signed)
[facility] Consult Note Telephone: [phone]  Fax: [phone]    PATIENT NAME: Jill Hancock [address] [place] [address] [phone] (home)  DOB: 1941/01/24 MRN: [id number]  PRIMARY CARE PROVIDER:   Alvester Morin, MD, [address]. [place] [address] [phone]  REFERRING PROVIDER:  Alvester Morin, MD [address] [place],  [place] [address] [phone]  RESPONSIBLE PARTY:   Extended Emergency Contact Information Primary Emergency Contact: Dimas Alexandria [place] of [place] Mobile Phone: [phone] Relation: Daughter Secondary Emergency Contact: Theophilus Kinds Mobile Phone: [phone] Relation: Daughter   I met with patient and family in the home / facility.  ASSESSMENT AND RECOMMENDATIONS:   1. Advance Care Planning/Goals of Care: Goals include to maximize quality of life and symptom management. DNR on record.   2. Symptom Management:    Pain: Resolved now after fall. Has not had any fall injuries recently. Mat on floor for falls. Using prns for any needs.  Fall risk: getting up with help only. Has fall mats by bed.  Nutrition: Eating around 50% but has gained weight, 7 lbs. She has a  Historic fluctuation in this amount.   Agitation:Appears to be managed. Has a new roommate which she  Has been interacting well with. 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   3. Family /Caregiver/Community Supports: Daughter is Designer, industrial/product, lives in [facility] facility.  4. Cognitive / Functional decline: Alert, oriented x 2. Needs assistance with most adls, all iadls.   5. Follow up Palliative Care Visit: Palliative care will continue to follow for goals of care clarification and symptom management. Return 6-8 weeks or prn.  I spent 25 minutes providing this consultation,  from 1000 to 1025. More than 50% of the time in this consultation was spent coordinating  communication.   HISTORY OF PRESENT ILLNESS:  Jill Hancock is a 79 y.o. year old female with multiple medical problems including dementia FAST 6E-7A, osteoporosis, vertebral fracture, frequent falls. Palliative Care was asked to follow this patient by consultation request of Slade-Hartman, Ivette Loyal* to help address advance care planning and goals of care. This is a follow up visit.  CODE STATUS: DNR  PPS: 30% HOSPICE ELIGIBILITY/DIAGNOSIS: TBD  PAST MEDICAL HISTORY:  Past Medical History:  Diagnosis Date  . Anxiety   . Depression   . Hyperglycemia   . Hypertension   . Hypokalemia   . Osteoporosis   . Stroke [facility]) 02/2012   MRI revealed at least 3 subcentimeter acute infarctions with one area of subacute infarction in widely disparate vascular territories including territory of left cerebellum and around right caudate nucleus along with widespread lacunar infarcts, chronic microvascular ischemic change, and numerous microbleeds suggesting long standing hypertensive cerebrovascular disease.  MRA confirmed intracranial  . Unsteady gait   . Vertigo     SOCIAL HX:  Social History   Tobacco Use  . Smoking status: Never Smoker  . Smokeless tobacco: Never Used  Substance Use Topics  . Alcohol use: No    Alcohol/week: 0.0 standard drinks    ALLERGIES:  Allergies  Allergen Reactions  . Demerol [Meperidine] Nausea And Vomiting  . Macrobid [Nitrofurantoin Macrocrystal] Rash  . Penicillins Rash    Has patient had a PCN reaction causing immediate rash, facial/tongue/throat swelling, SOB or lightheadedness with hypotension: Unknown Has patient had a PCN reaction causing severe rash involving mucus membranes or skin necrosis: Unknown Has patient had a  PCN reaction that required hospitalization: Unknown Has patient had a PCN reaction occurring within the last 10 years: No If all of the above answers are "NO", then may proceed with Cephalosporin use.      PERTINENT MEDICATIONS:    Outpatient Encounter Medications as of 10/15/2019  Medication Sig  . acetaminophen (TYLENOL) 325 MG tablet Take 2 tablets (650 mg total) by mouth every 4 (four) hours as needed for mild pain, moderate pain or fever (or temp > 37.5 C (99.5 F)).  Marland Kitchen alendronate (FOSAMAX) 70 MG tablet Take 1 tablet (70 mg total) by mouth every Monday.  Marland Kitchen amLODipine (NORVASC) 10 MG tablet TAKE 1 TABLET BY MOUTH EVERY DAY (Patient taking differently: Take 10 mg by mouth at bedtime. )  . aspirin 325 MG tablet Take 325 mg by mouth daily.  Marland Kitchen atorvastatin (LIPITOR) 40 MG tablet Take 1 tablet (40 mg total) by mouth daily at 6 PM. (Patient taking differently: Take 40 mg by mouth at bedtime. )  . Cholecalciferol (VITAMIN D3) 50 MCG (2000 UT) TABS Take 2,000 Units by mouth daily.  . divalproex (DEPAKOTE SPRINKLE) 125 MG capsule 125 mg 2 (two) times daily.   Marland Kitchen docusate sodium (COLACE) 100 MG capsule Take 1 capsule (100 mg total) by mouth 2 (two) times daily.  Marland Kitchen FLUoxetine (PROZAC) 20 MG capsule 20 mg daily.   Marland Kitchen lisinopril (PRINIVIL,ZESTRIL) 40 MG tablet Take 1 tablet (40 mg total) by mouth daily. (Patient taking differently: Take 40 mg by mouth at bedtime. )  . magnesium oxide (MAG-OX) 400 MG tablet Take 200 mg by mouth daily.  . Melatonin 5 MG CAPS Take 1 capsule (5 mg total) by mouth at bedtime.  . meloxicam (MOBIC) 7.5 MG tablet   . memantine (NAMENDA) 10 MG tablet Take 10 mg by mouth 2 (two) times daily.   . Multiple Vitamins-Minerals (CERTAVITE/ANTIOXIDANTS) TABS Take by mouth.  . pantoprazole (PROTONIX) 20 MG tablet Take 20 mg by mouth daily.  . traZODone (DESYREL) 100 MG tablet Take 1 tablet (100 mg total) by mouth at bedtime.  . [DISCONTINUED] feeding supplement, ENSURE ENLIVE, (ENSURE ENLIVE) LIQD Take 237 mLs by mouth 2 (two) times daily between meals.  . [DISCONTINUED] aspirin 81 MG tablet Take 1 tablet (81 mg total) by mouth daily. (Patient taking differently: Take 81 mg by mouth at bedtime. )  . [DISCONTINUED]  clopidogrel (PLAVIX) 75 MG tablet Take 1 tablet (75 mg total) by mouth daily.  . [DISCONTINUED] esomeprazole (NEXIUM) 20 MG capsule TAKE 1 CAPSULE (20 MG TOTAL) BY MOUTH DAILY BEFORE BREAKFAST.  . [DISCONTINUED] LORazepam (ATIVAN) 0.5 MG tablet Take 1 tablet (0.5 mg total) by mouth every 8 (eight) hours as needed for anxiety or sleep (pt uses to help her sleep at night with trazadone). (Patient not taking: Reported on 03/20/2019)  . [DISCONTINUED] methocarbamol (ROBAXIN) 500 MG tablet Take 250-500 mg by mouth every 6 (six) hours as needed (pain).  . [DISCONTINUED] QUEtiapine (SEROQUEL) 25 MG tablet Take 25 mg by mouth at bedtime. TAKE 1/2 TABLET   No facility-administered encounter medications on file as of 10/15/2019.     PHYSICAL EXAM / ROS:   Current and past weights: 143 lbs currently  General: NAD, frail appearing, thin, relaxed and interactive Cardiovascular: no chest pain reported, no edema Pulmonary: no cough, no increased SOB, room air Abdomen: appetite fair, endorses constipation, incontinent of bowel GU: denies dysuria, incontinent of urine MSK:  no joint deformities, non ambulatory, no falls reported Skin: no rashes or wounds  reported Neurological: Weakness, dementia. Able to answer simple questions.  Jason Coop, NP [facility]  COVID-19 PATIENT SCREENING TOOL  Person answering questions: ____________Staff_______ _____   1.  Is the patient or any family member in the home showing any signs or symptoms regarding respiratory infection?               Person with Symptom- __________NA_________________  a. Fever                                                                          Yes___ No___          ___________________  b. Shortness of breath                                                    Yes___ No___          ___________________ c. Cough/congestion                                       Yes___  No___         ___________________ d. Body aches/pains                                                          Yes___ No___        ____________________ e. Gastrointestinal symptoms (diarrhea, nausea)           Yes___ No___        ____________________  2. Within the past 14 days, has anyone living in the home had any contact with someone with or under investigation for COVID-19?    Yes___ No_X_   Person __________________

## 2019-10-22 DIAGNOSIS — L298 Other pruritus: Secondary | ICD-10-CM | POA: Diagnosis not present

## 2019-10-22 DIAGNOSIS — R3989 Other symptoms and signs involving the genitourinary system: Secondary | ICD-10-CM | POA: Diagnosis not present

## 2019-10-23 DIAGNOSIS — N390 Urinary tract infection, site not specified: Secondary | ICD-10-CM | POA: Diagnosis not present

## 2019-10-23 DIAGNOSIS — R319 Hematuria, unspecified: Secondary | ICD-10-CM | POA: Diagnosis not present

## 2019-10-23 DIAGNOSIS — Z03818 Encounter for observation for suspected exposure to other biological agents ruled out: Secondary | ICD-10-CM | POA: Diagnosis not present

## 2019-10-23 DIAGNOSIS — Z79899 Other long term (current) drug therapy: Secondary | ICD-10-CM | POA: Diagnosis not present

## 2019-10-28 DIAGNOSIS — Z03818 Encounter for observation for suspected exposure to other biological agents ruled out: Secondary | ICD-10-CM | POA: Diagnosis not present

## 2019-12-06 ENCOUNTER — Emergency Department
Admission: EM | Admit: 2019-12-06 | Discharge: 2019-12-07 | Disposition: A | Discharge status: Home / Self Care | Payer: Medicare Other | Attending: Emergency Medicine | Admitting: Emergency Medicine

## 2019-12-06 ENCOUNTER — Other Ambulatory Visit: Payer: Self-pay

## 2019-12-06 ENCOUNTER — Emergency Department: Payer: Medicare Other

## 2019-12-06 DIAGNOSIS — Z7982 Long term (current) use of aspirin: Secondary | ICD-10-CM | POA: Diagnosis not present

## 2019-12-06 DIAGNOSIS — Z79899 Other long term (current) drug therapy: Secondary | ICD-10-CM | POA: Insufficient documentation

## 2019-12-06 DIAGNOSIS — R109 Unspecified abdominal pain: Secondary | ICD-10-CM | POA: Insufficient documentation

## 2019-12-06 DIAGNOSIS — K625 Hemorrhage of anus and rectum: Secondary | ICD-10-CM | POA: Insufficient documentation

## 2019-12-06 DIAGNOSIS — I10 Essential (primary) hypertension: Secondary | ICD-10-CM | POA: Insufficient documentation

## 2019-12-06 DIAGNOSIS — N390 Urinary tract infection, site not specified: Secondary | ICD-10-CM

## 2019-12-06 DIAGNOSIS — K5792 Diverticulitis of intestine, part unspecified, without perforation or abscess without bleeding: Secondary | ICD-10-CM

## 2019-12-06 DIAGNOSIS — F0390 Unspecified dementia without behavioral disturbance: Secondary | ICD-10-CM | POA: Insufficient documentation

## 2019-12-06 LAB — CBC
HCT: 35.7 % — ABNORMAL LOW (ref 36.0–46.0)
Hemoglobin: 12.6 g/dL (ref 12.0–15.0)
MCH: 31.1 pg (ref 26.0–34.0)
MCHC: 35.3 g/dL (ref 30.0–36.0)
MCV: 88.1 fL (ref 80.0–100.0)
Platelets: 294 10*3/uL (ref 150–400)
RBC: 4.05 MIL/uL (ref 3.87–5.11)
RDW: 13.5 % (ref 11.5–15.5)
WBC: 12.2 10*3/uL — ABNORMAL HIGH (ref 4.0–10.5)
nRBC: 0 % (ref 0.0–0.2)

## 2019-12-06 LAB — TYPE AND SCREEN
ABO/RH(D): A POS
Antibody Screen: NEGATIVE

## 2019-12-06 LAB — COMPREHENSIVE METABOLIC PANEL
ALT: 18 U/L (ref 0–44)
AST: 27 U/L (ref 15–41)
Albumin: 3.5 g/dL (ref 3.5–5.0)
Alkaline Phosphatase: 36 U/L — ABNORMAL LOW (ref 38–126)
Anion gap: 13 (ref 5–15)
BUN: 15 mg/dL (ref 8–23)
CO2: 27 mmol/L (ref 22–32)
Calcium: 8.9 mg/dL (ref 8.9–10.3)
Chloride: 100 mmol/L (ref 98–111)
Creatinine, Ser: 1.17 mg/dL — ABNORMAL HIGH (ref 0.44–1.00)
GFR calc Af Amer: 51 mL/min — ABNORMAL LOW (ref >60)
GFR calc non Af Amer: 44 mL/min — ABNORMAL LOW (ref >60)
Glucose, Bld: 104 mg/dL — ABNORMAL HIGH (ref 70–99)
Potassium: 2.8 mmol/L — ABNORMAL LOW (ref 3.5–5.1)
Sodium: 140 mmol/L (ref 135–145)
Total Bilirubin: 0.6 mg/dL (ref 0.3–1.2)
Total Protein: 6.8 g/dL (ref 6.5–8.1)

## 2019-12-06 MED ORDER — CIPROFLOXACIN HCL 500 MG PO TABS
500.0000 mg | ORAL_TABLET | Freq: Two times a day (BID) | ORAL | 0 refills | Status: AC
Start: 2019-12-06 — End: 2019-12-16

## 2019-12-06 MED ORDER — IOHEXOL 300 MG/ML  SOLN
75.0000 mL | Freq: Once | INTRAMUSCULAR | Status: AC | PRN
  Administered 2019-12-06: 75 mL via INTRAVENOUS

## 2019-12-06 MED ORDER — TRAZODONE HCL 100 MG PO TABS
100.0000 mg | ORAL_TABLET | Freq: Every day | ORAL | Status: DC
  Administered 2019-12-06: 100 mg via ORAL
  Filled 2019-12-06: qty 1

## 2019-12-06 MED ORDER — METRONIDAZOLE 500 MG PO TABS
500.0000 mg | ORAL_TABLET | Freq: Three times a day (TID) | ORAL | 0 refills | Status: AC
Start: 2019-12-06 — End: 2019-12-16

## 2019-12-06 MED ORDER — MORPHINE SULFATE (PF) 2 MG/ML IV SOLN
1.0000 mg | Freq: Once | INTRAVENOUS | Status: DC
  Filled 2019-12-06: qty 1

## 2019-12-06 MED ORDER — POTASSIUM CHLORIDE CRYS ER 20 MEQ PO TBCR
40.0000 meq | EXTENDED_RELEASE_TABLET | Freq: Once | ORAL | Status: AC
  Administered 2019-12-06: 40 meq via ORAL
  Filled 2019-12-06: qty 2

## 2019-12-06 NOTE — ED Notes (Signed)
Rectal exam performed by Dr Derrill Kay. Pt positive for occult blood. Pt tolerated exam well.

## 2019-12-06 NOTE — ED Provider Notes (Signed)
[facility] Emergency Department Provider Note   ____________________________________________   I have reviewed the triage vital signs and the nursing notes.   HISTORY  Chief Complaint Rectal Bleeding   History limited by: Not Limited   HPI Jill Hancock is a 79 y.o. female who presents to the emergency department today from [facility] because of concerns for rectal bleeding.  Bleeding was first noticed 2 days ago.  Patient denies ever having rectal bleeding in the past.  She denies any associated abdominal pain.  She states she is on aspirin but no other blood thinners.  She denies any shortness of breath or chest pain.  Records reviewed. Per medical record review patient has a history of HTN, CVA.   Past Medical History:  Diagnosis Date   Anxiety    Depression    Hyperglycemia    Hypertension    Hypokalemia    Osteoporosis    Stroke (HCC) 02/2012   MRI revealed at least 3 subcentimeter acute infarctions with one area of subacute infarction in widely disparate vascular territories including territory of left cerebellum and around right caudate nucleus along with widespread lacunar infarcts, chronic microvascular ischemic change, and numerous microbleeds suggesting long standing hypertensive cerebrovascular disease.  MRA confirmed intracranial   Unsteady gait    Vertigo     Patient Active Problem List   Diagnosis Date Noted   UTI (urinary tract infection) 01/12/2019   Slurred speech 01/11/2019   Palliative care patient 01/02/2019   CVA (cerebral vascular accident) (HCC) 12/17/2018   Closed left hip fracture (HCC) 11/21/2018   Pre-diabetes 07/25/2018   Multiple rib fractures 12/31/2017   Primary osteoarthritis of right knee 12/14/2017   Dementia, vascular (HCC) 12/04/2017   Right shoulder injury 11/12/2015   Urinary urgency 09/17/2015   Hyperglycemia 07/29/2015   Restless leg 06/07/2015   Benign paroxysmal  positional vertigo 06/07/2015   Hyperlipidemia 04/16/2015   Vitamin D deficiency 08/13/2014   T12 compression fracture (HCC) 04/28/2014   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   Major depression, recurrent, chronic (HCC) 11/24/2012   GERD (gastroesophageal reflux disease) 11/24/2012   Osteoporosis 09/05/2012   Right hip pain 08/23/2012   Anxiety 05/04/2012   Hypertension 02/27/2012   History of cerebrovascular accident (CVA) with residual deficit 02/25/2012   Unsteady gait 02/25/2012    Past Surgical History:  Procedure Laterality Date   BREAST BIOPSY Right    pt not sure when    CHOLECYSTECTOMY     INTRAMEDULLARY (IM) NAIL INTERTROCHANTERIC Left 11/21/2018   Procedure: INTRAMEDULLARY (IM) NAIL INTERTROCHANTRIC;  Surgeon: Juanell Fairly, MD;  Location: [facility] ORS;  Service: Orthopedics;  Laterality: Left;   VAGINAL HYSTERECTOMY      Prior to Admission medications   Medication Sig Start Date End Date Taking? Authorizing Provider  acetaminophen (TYLENOL) 325 MG tablet Take 2 tablets (650 mg total) by mouth every 4 (four) hours as needed for mild pain, moderate pain or fever (or temp > 37.5 C (99.5 F)). 01/16/19   Auburn Bilberry, MD  alendronate (FOSAMAX) 70 MG tablet Take 1 tablet (70 mg total) by mouth every Monday. 01/27/19   Karamalegos, Netta Neat, DO  amLODipine (NORVASC) 10 MG tablet TAKE 1 TABLET BY MOUTH EVERY DAY Patient taking differently: Take 10 mg by mouth at bedtime.  08/26/18   Karamalegos, Netta Neat, DO  aspirin 325 MG tablet Take 325 mg by mouth daily.    [provider]  atorvastatin (LIPITOR) 40 MG tablet Take 1 tablet (40 mg  total) by mouth daily at 6 PM. Patient taking differently: Take 40 mg by mouth at bedtime.  12/20/18   Gladstone Lighter, MD  Cholecalciferol (VITAMIN D3) 50 MCG (2000 UT) TABS Take 2,000 Units by mouth daily.    [provider]  divalproex (DEPAKOTE SPRINKLE) 125 MG capsule 125 mg 2 (two) times daily.  03/13/19    [provider]  docusate sodium (COLACE) 100 MG capsule Take 1 capsule (100 mg total) by mouth 2 (two) times daily. 11/26/18   [name], MD  FLUoxetine (PROZAC) 20 MG capsule 20 mg daily.  03/13/19   [provider]  lisinopril (PRINIVIL,ZESTRIL) 40 MG tablet Take 1 tablet (40 mg total) by mouth daily. Patient taking differently: Take 40 mg by mouth at bedtime.  02/22/18   Karamalegos, Devonne Doughty, DO  magnesium oxide (MAG-OX) 400 MG tablet Take 200 mg by mouth daily.    [provider]  Melatonin 5 MG CAPS Take 1 capsule (5 mg total) by mouth at bedtime. 01/16/19   Dustin Flock, MD  meloxicam (MOBIC) 7.5 MG tablet  03/13/19   [provider]  memantine (NAMENDA) 10 MG tablet Take 10 mg by mouth 2 (two) times daily.  03/13/19   [provider]  Multiple Vitamins-Minerals (CERTAVITE/ANTIOXIDANTS) TABS Take by mouth.    [provider]  pantoprazole (PROTONIX) 20 MG tablet Take 20 mg by mouth daily.    [provider]  traZODone (DESYREL) 100 MG tablet Take 1 tablet (100 mg total) by mouth at bedtime. 01/16/19   Dustin Flock, MD  esomeprazole (NEXIUM) 20 MG capsule TAKE 1 CAPSULE (20 MG TOTAL) BY MOUTH DAILY BEFORE BREAKFAST. 12/18/18 12/23/18  Olin Hauser, DO    Allergies Demerol [meperidine], Macrobid [nitrofurantoin macrocrystal], and Penicillins  Family History  Problem Relation Age of Onset   Heart attack Mother    Heart disease Mother    Heart disease Father    Heart attack Brother        death at age 75   Stroke Brother        death at 49   Bladder Cancer Neg Hx    Kidney cancer Neg Hx     Social History Social History   Tobacco Use   Smoking status: Never Smoker   Smokeless tobacco: Never Used  Substance Use Topics   Alcohol use: No    Alcohol/week: 0.0 standard drinks   Drug use: No    Review of Systems Constitutional: No fever/chills Eyes: No visual changes. ENT: No  sore throat. Cardiovascular: Denies chest pain. Respiratory: Denies shortness of breath. Gastrointestinal: Positive for rectal bleeding.  Genitourinary: Negative for dysuria. Musculoskeletal: Negative for back pain. Skin: Negative for rash. Neurological: Negative for headaches, focal weakness or numbness.  ____________________________________________   PHYSICAL EXAM:  VITAL SIGNS: ED Triage Vitals  Enc Vitals Group     BP 12/06/19 2047 (!) 146/86     Pulse Rate 12/06/19 2047 78     Resp 12/06/19 2047 (!) 22     Temp 12/06/19 2047 98.4 F (36.9 C)     Temp Source 12/06/19 2047 Oral     SpO2 12/06/19 2047 100 %     Weight 12/06/19 2050 140 lb (63.5 kg)     Height 12/06/19 2050 5\' 4"  (1.626 m)     Head Circumference --      Peak Flow --      Pain Score 12/06/19 2050 0   Constitutional: Alert and oriented.  Eyes:  Conjunctivae are normal.  ENT      Head: Normocephalic and atraumatic.      Nose: No congestion/rhinnorhea.      Mouth/Throat: Mucous membranes are moist.      Neck: No stridor. Hematological/Lymphatic/Immunilogical: No cervical lymphadenopathy. Cardiovascular: Normal rate, regular rhythm.  No murmurs, rubs, or gallops.  Respiratory: Normal respiratory effort without tachypnea nor retractions. Breath sounds are clear and equal bilaterally. No wheezes/rales/rhonchi. Gastrointestinal: Soft and non tender. No rebound. No guarding.  Rectal: Small amount of red blood on glove. GUIAC positive.  Musculoskeletal: Normal range of motion in all extremities. No lower extremity edema. Neurologic:  Normal speech and language. No gross focal neurologic deficits are appreciated.  Skin:  Skin is warm, dry and intact. No rash noted. Psychiatric: Mood and affect are normal. Speech and behavior are normal. Patient exhibits appropriate insight and judgment.  ____________________________________________    LABS (pertinent positives/negatives)  CBC wbc 12.2, hgb 12.6, plt 294 CMP  na 140, k 2.8, glu 104, cr 1.17  ____________________________________________   EKG  None  ____________________________________________    RADIOLOGY  None  ____________________________________________   PROCEDURES  Procedures  ____________________________________________   INITIAL IMPRESSION / ASSESSMENT AND PLAN / ED COURSE  Pertinent labs & imaging results that were available during my care of the patient were reviewed by me and considered in my medical decision making (see chart for details).   Patient presented to the emergency department today because of concerns for rectal bleeding.  On my exam patient did have small amount of red blood on the glove after the rectal exam.  Patient's initial hemoglobin however was normal.  Initial plan was to observe here in the emergency department and repeat hemoglobin and if stable likely discharge home.  However during her stay here in the emergency department she started complaining of some left flank pain.  Patient did have a very mild leukocytosis.  Will send patient for CT to evaluate for possible diverticulitis or other intra-abdominal infection  ___________________________________________   FINAL CLINICAL IMPRESSION(S) / ED DIAGNOSES  Final diagnoses:  Rectal bleeding     Note: This dictation was prepared with Dragon dictation. Any transcriptional errors that result from this process are unintentional     Phineas Semen, MD 12/06/19 [phone]

## 2019-12-06 NOTE — Discharge Instructions (Addendum)
Take antibiotics as prescribed (Cipro/Flagyl 500 mg twice daily x7 days).  Please seek medical attention for any high fevers, chest pain, shortness of breath, change in behavior, persistent vomiting, bloody stool or any other new or concerning symptoms.

## 2019-12-06 NOTE — ED Triage Notes (Signed)
Pt arrives to ED from [facility] with via [facility] with c/c of rectal bleeding. EMS reports transport vitals of 135/76, p 92, O2 sat 98% on room air, CBG 108. Upon arrival pt A&O to self and situation but not to time. NAD, no respiratory Sx evident.

## 2019-12-07 DIAGNOSIS — K625 Hemorrhage of anus and rectum: Secondary | ICD-10-CM | POA: Diagnosis not present

## 2019-12-07 LAB — URINALYSIS, COMPLETE (UACMP) WITH MICROSCOPIC
Bacteria, UA: NONE SEEN
Bilirubin Urine: NEGATIVE
Glucose, UA: NEGATIVE mg/dL
Ketones, ur: NEGATIVE mg/dL
Nitrite: NEGATIVE
Protein, ur: 30 mg/dL — AB
RBC / HPF: >50 RBC/hpf — ABNORMAL HIGH (ref 0–5)
Specific Gravity, Urine: 1.021 (ref 1.005–1.030)
WBC, UA: >50 WBC/hpf — ABNORMAL HIGH (ref 0–5)
pH: 7 (ref 5.0–8.0)

## 2019-12-07 LAB — HEMOGLOBIN AND HEMATOCRIT, BLOOD
HCT: 32.4 % — ABNORMAL LOW (ref 36.0–46.0)
Hemoglobin: 11.7 g/dL — ABNORMAL LOW (ref 12.0–15.0)

## 2019-12-07 MED ORDER — CIPROFLOXACIN HCL 500 MG PO TABS
500.0000 mg | ORAL_TABLET | Freq: Two times a day (BID) | ORAL | 0 refills | Status: DC
Start: 2019-12-07 — End: 2020-03-19

## 2019-12-07 MED ORDER — METRONIDAZOLE IN NACL 5-0.79 MG/ML-% IV SOLN
500.0000 mg | Freq: Once | INTRAVENOUS | Status: AC
  Administered 2019-12-07: 500 mg via INTRAVENOUS
  Filled 2019-12-07: qty 100

## 2019-12-07 MED ORDER — METRONIDAZOLE 500 MG PO TABS
500.0000 mg | ORAL_TABLET | Freq: Two times a day (BID) | ORAL | 0 refills | Status: DC
Start: 2019-12-07 — End: 2020-03-19

## 2019-12-07 MED ORDER — SODIUM CHLORIDE 0.9 % IV SOLN: 1.0000 g | Freq: Once | INTRAVENOUS | Status: DC

## 2019-12-07 MED ORDER — CIPROFLOXACIN IN D5W 400 MG/200ML IV SOLN
400.0000 mg | Freq: Once | INTRAVENOUS | Status: AC
  Administered 2019-12-07: 400 mg via INTRAVENOUS
  Filled 2019-12-07: qty 200

## 2019-12-07 NOTE — ED Notes (Signed)
Attempted to call report 2nd time, again transferred to appropriate unit no one answered telephone, let phone ring for 3 minutes.

## 2019-12-07 NOTE — ED Notes (Signed)
Pt declined morphine.

## 2019-12-07 NOTE — ED Notes (Signed)
Pt complaining of left flank pain. Dr Derrill Kay notified.

## 2019-12-07 NOTE — ED Provider Notes (Signed)
-----------------------------------------   00:15 AM on 12/07/2019 -----------------------------------------  Assumed care of patient who is pending repeat H/H and UA.  If stable, plan is to discharge patient back to nursing facility with GI follow-up.   ----------------------------------------- 2:48 AM on 12/07/2019 -----------------------------------------  H/H remained stable.  UA demonstrates large leukocytes, >50 WBC.  Patient is penicillin allergic, also with diverticulitis on CT scan.  Will treat with Cipro/Flagyl.  Patient will receive a dose of each via IV prior to discharge back to nursing facility.     Irean Hong, MD 12/07/19 [phone]

## 2019-12-07 NOTE — ED Notes (Signed)
Report called to white [employer], given to Elmo.  Awaiting EMS transport.

## 2019-12-07 NOTE — ED Notes (Signed)
Attempted to call report, transferred to appropriate unit but then disconnected.

## 2019-12-09 LAB — URINE CULTURE: Culture: >=100000 — AB

## 2019-12-10 ENCOUNTER — Non-Acute Institutional Stay: Payer: Medicare Other | Admitting: Primary Care

## 2019-12-10 ENCOUNTER — Other Ambulatory Visit: Payer: Self-pay

## 2019-12-10 DIAGNOSIS — Z515 Encounter for palliative care: Secondary | ICD-10-CM

## 2019-12-10 NOTE — Progress Notes (Addendum)
[facility] Consult Note Telephone: [phone]  Fax: [phone]  PATIENT NAME: CATE ORAVEC [address] [place] [address] [phone] (home)  DOB: 02-06-1941 MRN: [id number]  PRIMARY CARE PROVIDER:    Alvester Morin, MD,  [address] [place] [address] [phone]  REFERRING PROVIDER:   Alvester Morin, MD [address] [place],  [place] [address] [phone]  RESPONSIBLE PARTY:   Extended Emergency Contact Information Primary Emergency Contact: Dimas [place] Mobile Phone: [phone] Relation: Daughter Secondary Emergency Contact: Theophilus Kinds Mobile Phone: [phone] Relation: Daughter  I met with patient in the facility.  ASSESSMENT AND RECOMMENDATIONS:   1. Advance Care Planning/Goals of Care: Goals include to maximize quality of life and symptom management.  On file at SNF.   2. Symptom Management:   I saw Mrs. Hilton in today in the nursing home. She regarding me but seemed unable to converse. She was a little teary but could not tell why. Staff did not state any big change in her demeanor. She has continue to fall per the staff and I would recommend a scoop mattress for her to have to decrease her out of bed falls. She is also in a padded wheelchair design to decrease falls and this has helped significantly.  Recent hospital visit for rectal bleeding and being rx with abx.   l called her POA but phone was not answered, I left a message for a callback if needed and a brief report.  3. Family /Caregiver/Community Supports: Family in area, lives in [place].  4. Cognitive / Functional decline: a and O x 1-2, teary today. Needs help with many adls and all iadls.   5. Follow up Palliative Care Visit: Palliative care will continue to follow for goals of care clarification and symptom management. Return 6-8 weeks or prn.  I spent 25 minutes providing  this consultation,  from 1145 to 1210. More than 50% of the time in this consultation was spent coordinating communication.   HISTORY OF PRESENT ILLNESS:  NAYLEE FRANKOWSKI is a 79 y.o. year old female with multiple medical problems including dementia, frequent falls. Palliative Care was asked to follow this patient by consultation request of Slade-Hartman, Ivette Loyal* to help address advance care planning and goals of care. This is a follow up visit.  CODE STATUS: DNR  PPS: 40%  HOSPICE ELIGIBILITY/DIAGNOSIS: TBD  PAST MEDICAL HISTORY:  Past Medical History:  Diagnosis Date  . Anxiety   . Depression   . Hyperglycemia   . Hypertension   . Hypokalemia   . Osteoporosis   . Stroke [facility]) 02/2012   MRI revealed at least 3 subcentimeter acute infarctions with one area of subacute infarction in widely disparate vascular territories including territory of left cerebellum and around right caudate nucleus along with widespread lacunar infarcts, chronic microvascular ischemic change, and numerous microbleeds suggesting long standing hypertensive cerebrovascular disease.  MRA confirmed intracranial  . Unsteady gait   . Vertigo     SOCIAL HX:  Social History   Tobacco Use  . Smoking status: Never Smoker  . Smokeless tobacco: Never Used  Substance Use Topics  . Alcohol use: No    Alcohol/week: 0.0 standard drinks    ALLERGIES:  Allergies  Allergen Reactions  . Demerol [Meperidine] Nausea And Vomiting  . Macrobid [Nitrofurantoin Macrocrystal] Rash  . Penicillins Rash    Has patient had a PCN reaction causing immediate rash, facial/tongue/throat swelling, SOB or lightheadedness with  hypotension: Unknown Has patient had a PCN reaction causing severe rash involving mucus membranes or skin necrosis: Unknown Has patient had a PCN reaction that required hospitalization: Unknown Has patient had a PCN reaction occurring within the last 10 years: No If all of the above answers are "NO", then may  proceed with Cephalosporin use.      PERTINENT MEDICATIONS:  Outpatient Encounter Medications as of 12/10/2019  Medication Sig  . acetaminophen (TYLENOL) 325 MG tablet Take 2 tablets (650 mg total) by mouth every 4 (four) hours as needed for mild pain, moderate pain or fever (or temp > 37.5 C (99.5 F)).  Marland Kitchen alendronate (FOSAMAX) 70 MG tablet Take 1 tablet (70 mg total) by mouth every Monday.  Marland Kitchen amLODipine (NORVASC) 10 MG tablet TAKE 1 TABLET BY MOUTH EVERY DAY (Patient taking differently: Take 10 mg by mouth at bedtime. )  . aspirin 325 MG tablet Take 325 mg by mouth daily.  Marland Kitchen atorvastatin (LIPITOR) 40 MG tablet Take 1 tablet (40 mg total) by mouth daily at 6 PM. (Patient taking differently: Take 40 mg by mouth at bedtime. )  . Cholecalciferol (VITAMIN D3) 50 MCG (2000 UT) TABS Take 2,000 Units by mouth daily.  . ciprofloxacin (CIPRO) 500 MG tablet Take 1 tablet (500 mg total) by mouth 2 (two) times daily for 10 days.  . ciprofloxacin (CIPRO) 500 MG tablet Take 1 tablet (500 mg total) by mouth 2 (two) times daily.  . divalproex (DEPAKOTE SPRINKLE) 125 MG capsule 125 mg 2 (two) times daily.   Marland Kitchen docusate sodium (COLACE) 100 MG capsule Take 1 capsule (100 mg total) by mouth 2 (two) times daily.  Marland Kitchen FLUoxetine (PROZAC) 20 MG capsule 20 mg daily.   Marland Kitchen lisinopril (PRINIVIL,ZESTRIL) 40 MG tablet Take 1 tablet (40 mg total) by mouth daily. (Patient taking differently: Take 40 mg by mouth at bedtime. )  . magnesium oxide (MAG-OX) 400 MG tablet Take 200 mg by mouth daily.  . Melatonin 5 MG CAPS Take 1 capsule (5 mg total) by mouth at bedtime.  . meloxicam (MOBIC) 7.5 MG tablet   . memantine (NAMENDA) 10 MG tablet Take 10 mg by mouth 2 (two) times daily.   . metroNIDAZOLE (FLAGYL) 500 MG tablet Take 1 tablet (500 mg total) by mouth 3 (three) times daily for 10 days.  . metroNIDAZOLE (FLAGYL) 500 MG tablet Take 1 tablet (500 mg total) by mouth 2 (two) times daily.  . Multiple Vitamins-Minerals  (CERTAVITE/ANTIOXIDANTS) TABS Take by mouth.  . pantoprazole (PROTONIX) 20 MG tablet Take 20 mg by mouth daily.  . traZODone (DESYREL) 100 MG tablet Take 1 tablet (100 mg total) by mouth at bedtime.  . [DISCONTINUED] esomeprazole (NEXIUM) 20 MG capsule TAKE 1 CAPSULE (20 MG TOTAL) BY MOUTH DAILY BEFORE BREAKFAST.   No facility-administered encounter medications on file as of 12/10/2019.    PHYSICAL EXAM / ROS:   Current and past weights: unavailable. General: NAD, frail appearing, thin Cardiovascular: no chest pain reported, no edema  Pulmonary: no cough, no increased SOB, room air Abdomen: appetite fair,  incontinent of bowel GU: denies dysuria, incontinent of urine MSK:  + joint and ROM abnormalities, non- ambulatory, frequent falls from bed Skin: no rashes or wounds reported Neurological: Weakness, dementia Jason Coop, NP [facility]  COVID-19 PATIENT SCREENING TOOL  Person answering questions: ____________Staff_______ _____   1.  Is the patient or any family member in the home showing any signs or symptoms regarding respiratory infection?  Person with Symptom- __________NA_________________  a. Fever                                                                          Yes___ No___          ___________________  b. Shortness of breath                                                    Yes___ No___          ___________________ c. Cough/congestion                                       Yes___  No___         ___________________ d. Body aches/pains                                                         Yes___ No___        ____________________ e. Gastrointestinal symptoms (diarrhea, nausea)           Yes___ No___        ____________________  2. Within the past 14 days, has anyone living in the home had any contact with someone with or under investigation for COVID-19?    Yes___ No_X_   Person __________________   

## 2020-01-01 ENCOUNTER — Other Ambulatory Visit: Payer: Self-pay

## 2020-01-01 ENCOUNTER — Ambulatory Visit (INDEPENDENT_AMBULATORY_CARE_PROVIDER_SITE_OTHER): Payer: Medicare Other | Admitting: Gastroenterology

## 2020-01-01 VITALS — BP 122/77 | HR 85 | Temp 98.1°F | Ht 62.0 in

## 2020-01-01 DIAGNOSIS — Z8719 Personal history of other diseases of the digestive system: Secondary | ICD-10-CM

## 2020-01-01 NOTE — Progress Notes (Signed)
Jill Mood MD, MRCP(U.K) [address]  [address]  [place], [place] [address]  Main: [phone]  Fax: [phone]   Gastroenterology Consultation  Referring Provider:    ER Primary Care Physician:  Keane Police, MD Primary Gastroenterologist:  Dr. Wyline Hancock  Reason for Consultation:     Rectal bleeding         HPI:   Jill Hancock is a 79 y.o. y/o female presented to the emergency room on 12/06/2019 with rectal bleeding.  Noticed for the 2 prior days.  She was on aspirin.  She underwent a CT scan of the abdomen and pelvis with contrast that demonstrated thick-walled urinary bladder with perivascular fat stranding suspicious for cystitis, colonic diverticulosis with possible mild diverticulitis involving proximal sigmoid colon.  Moderate hiatal hernia.  Chronic compression fractures of T12 L3 and L5.  Hemoglobin was 12.6 g with a white cell count of 12.2, creatinine of 1.17.  Urinalysis demonstrated large leukocytes and hemoglobin.  She was given a course of ciprofloxacin and Flagyl discharged with GI follow-up.  The patient states that her care home.  She is accompanied by a caregiver.  The caregiver does not make decisions on behalf of the patient.  The patient does have issues with memory apparently.  At times is aware of what is going on and at times is not.  She denied having any abdominal pains at this point of time only complaint was about her left knee which apparently she had a fall yesterday and hurt herself.  No other complaints.  She is an exnurse  Past Medical History:  Diagnosis Date  . Anxiety   . Depression   . Hyperglycemia   . Hypertension   . Hypokalemia   . Osteoporosis   . Stroke [facility]) 02/2012   MRI revealed at least 3 subcentimeter acute infarctions with one area of subacute infarction in widely disparate vascular territories including territory of left cerebellum and around right caudate nucleus along with widespread lacunar infarcts, chronic  microvascular ischemic change, and numerous microbleeds suggesting long standing hypertensive cerebrovascular disease.  MRA confirmed intracranial  . Unsteady gait   . Vertigo     Past Surgical History:  Procedure Laterality Date  . BREAST BIOPSY Right    pt not sure when   . CHOLECYSTECTOMY    . INTRAMEDULLARY (IM) NAIL INTERTROCHANTERIC Left 11/21/2018   Procedure: INTRAMEDULLARY (IM) NAIL INTERTROCHANTRIC;  Surgeon: Juanell Fairly, MD;  Location: [facility] ORS;  Service: Orthopedics;  Laterality: Left;  Marland Kitchen VAGINAL HYSTERECTOMY      Prior to Admission medications   Medication Sig Start Date End Date Taking? Authorizing Provider  acetaminophen (TYLENOL) 325 MG tablet Take 2 tablets (650 mg total) by mouth every 4 (four) hours as needed for mild pain, moderate pain or fever (or temp > 37.5 C (99.5 F)). 01/16/19   Auburn Bilberry, MD  alendronate (FOSAMAX) 70 MG tablet Take 1 tablet (70 mg total) by mouth every Monday. 01/27/19   Karamalegos, Netta Neat, DO  amLODipine (NORVASC) 10 MG tablet TAKE 1 TABLET BY MOUTH EVERY DAY Patient taking differently: Take 10 mg by mouth at bedtime.  08/26/18   Karamalegos, Netta Neat, DO  aspirin 325 MG tablet Take 325 mg by mouth daily.    [provider]  atorvastatin (LIPITOR) 40 MG tablet Take 1 tablet (40 mg total) by mouth daily at 6 PM. Patient taking differently: Take 40 mg by mouth at bedtime.  12/20/18   Enid Baas, MD  Cholecalciferol (  VITAMIN D3) 50 MCG (2000 UT) TABS Take 2,000 Units by mouth daily.    [provider]  ciprofloxacin (CIPRO) 500 MG tablet Take 1 tablet (500 mg total) by mouth 2 (two) times daily. 12/07/19   Irean HongSung, Jade J, MD  divalproex (DEPAKOTE SPRINKLE) 125 MG capsule 125 mg 2 (two) times daily.  03/13/19   [provider]  docusate sodium (COLACE) 100 MG capsule Take 1 capsule (100 mg total) by mouth 2 (two) times daily. 11/26/18   Altamese DillingVachhani, Vaibhavkumar, MD  FLUoxetine (PROZAC) 20 MG capsule 20 mg  daily.  03/13/19   [provider]  lisinopril (PRINIVIL,ZESTRIL) 40 MG tablet Take 1 tablet (40 mg total) by mouth daily. Patient taking differently: Take 40 mg by mouth at bedtime.  02/22/18   Karamalegos, Netta NeatAlexander J, DO  magnesium oxide (MAG-OX) 400 MG tablet Take 200 mg by mouth daily.    [provider]  Melatonin 5 MG CAPS Take 1 capsule (5 mg total) by mouth at bedtime. 01/16/19   Auburn BilberryPatel, Shreyang, MD  meloxicam (MOBIC) 7.5 MG tablet  03/13/19   [provider]  memantine (NAMENDA) 10 MG tablet Take 10 mg by mouth 2 (two) times daily.  03/13/19   [provider]  metroNIDAZOLE (FLAGYL) 500 MG tablet Take 1 tablet (500 mg total) by mouth 2 (two) times daily. 12/07/19   Irean HongSung, Jade J, MD  Multiple Vitamins-Minerals (CERTAVITE/ANTIOXIDANTS) TABS Take by mouth.    [provider]  pantoprazole (PROTONIX) 20 MG tablet Take 20 mg by mouth daily.    [provider]  traZODone (DESYREL) 100 MG tablet Take 1 tablet (100 mg total) by mouth at bedtime. 01/16/19   Auburn BilberryPatel, Shreyang, MD  esomeprazole (NEXIUM) 20 MG capsule TAKE 1 CAPSULE (20 MG TOTAL) BY MOUTH DAILY BEFORE BREAKFAST. 12/18/18 12/23/18  Smitty CordsKaramalegos, Alexander J, DO    Family History  Problem Relation Age of Onset  . Heart attack Mother   . Heart disease Mother   . Heart disease Father   . Heart attack Brother        death at age 80  . Stroke Brother        death at 5461  . Bladder Cancer Neg Hx   . Kidney cancer Neg Hx      Social History   Tobacco Use  . Smoking status: Never Smoker  . Smokeless tobacco: Never Used  Vaping Use  . Vaping Use: Never used  Substance Use Topics  . Alcohol use: No    Alcohol/week: 0.0 standard drinks  . Drug use: No    Allergies as of 01/01/2020 - Review Complete 12/06/2019  Allergen Reaction Noted  . Demerol [meperidine] Nausea And Vomiting 02/24/2012  . Macrobid [nitrofurantoin macrocrystal] Rash 12/02/2012  . Penicillins Rash 02/24/2012     Review of Systems:    All systems reviewed and negative except where noted in HPI.   Physical Exam:  There were no vitals taken for this visit. No LMP recorded. Patient has had a hysterectomy. Psych:  Alert and cooperative. Normal Hancock and affect. General:   Alert,  Well-developed, well-nourished, pleasant and cooperative in NAD Head:  Normocephalic and atraumatic. Eyes:  Sclera clear, no icterus.   Conjunctiva pink. Ears:  Normal auditory acuity. Lungs:  Respirations even and unlabored.  Clear throughout to auscultation.   No wheezes, crackles, or rhonchi. No acute distress. Heart:  Regular rate and rhythm; no murmurs, clicks, rubs, or gallops. Abdomen:  Normal bowel sounds.  No bruits.  Soft, non-tender and non-distended without masses, hepatosplenomegaly or hernias noted.  No guarding or rebound tenderness.    Neurologic:  Alert and oriented x1;   Psych:  Alert and cooperative. Normal Hancock and affect.  Imaging Studies: CT ABDOMEN PELVIS W CONTRAST  Result Date: 12/06/2019 CLINICAL DATA:  Left flank pain.  Rectal bleeding. EXAM: CT ABDOMEN AND PELVIS WITH CONTRAST TECHNIQUE: Multidetector CT imaging of the abdomen and pelvis was performed using the standard protocol following bolus administration of intravenous contrast. CONTRAST:  74mL OMNIPAQUE IOHEXOL 300 MG/ML  SOLN COMPARISON:  No prior abdominal imaging. FINDINGS: Lower chest: Emphysema. Trace left pleural effusion. Moderate-sized hiatal hernia with retained food contents in the stomach. Hepatobiliary: No focal liver abnormality is seen. Status post cholecystectomy. No biliary dilatation. Pancreas: No ductal dilatation or inflammation. Spleen: Normal in size without focal abnormality. Adrenals/Urinary Tract: Normal adrenal glands. No hydronephrosis or perinephric edema. Homogeneous renal enhancement with symmetric excretion on delayed phase imaging. Thick-walled urinary bladder with perivesicular fat stranding and mild trabeculation.  Stomach/Bowel: Moderate hiatal hernia. Retained food contents in the herniated stomach. Greater than 50% of the stomach is intrathoracic. No small bowel obstruction or inflammation. High-riding cecum in the right mid abdomen. Appendix not confidently visualized, there is no evidence of appendicitis. Moderate volume of colonic stool. Diverticulosis of the descending and sigmoid colon. Suggestion a pericolonic edema involving the proximal sigmoid colon, series 2, image 58 that may represent mild diverticulitis. No evidence of focal inflamed diverticulum. There is stool distending the rectum without rectal wall thickening. Vascular/Lymphatic: Moderate aortic atherosclerosis. There is aortic tortuosity. No aortic aneurysm. Patent portal vein. No adenopathy. Reproductive: Hysterectomy. Both ovaries tentatively visualized and quiescent. No adnexal mass. Other: No free air, free fluid, or intra-abdominal fluid collection. Musculoskeletal: Severe T12 compression fracture and moderate L3 and L5 compression fractures are chronic based on 11/22/2018 lumbar radiograph. Bones are under mineralized. Postsurgical change in the left proximal humerus fixating remote femur fracture. No acute osseous abnormalities. Scattered calcified granuloma in the bilateral subcutaneous flank soft tissues. IMPRESSION: 1. Thick-walled urinary bladder with perivesicular fat stranding and mild trabeculation, suspicious for cystitis. 2. Colonic diverticulosis with possible mild diverticulitis involving the proximal sigmoid colon. No perforation or abscess. 3. Moderate hiatal hernia with retained food contents in the herniated stomach. 4. Trace left pleural effusion. 5. Chronic compression fractures of T12, L3, and L5. Aortic Atherosclerosis (ICD10-I70.0) and Emphysema (ICD10-J43.9). Electronically Signed   By: Narda Rutherford M.D.   On: 12/06/2019 23:16    Assessment and Plan:   MIKALIA FESSEL is a 79 y.o. y/o female is here today to see me  after an ER visit in June 2020 when she presented with rectal bleeding of 2 days duration.  Hemoglobin was in the normal range.  CT scan of the abdomen and pelvis demonstrated possible cystitis and proximal sigmoid diverticulitis.  She was treated with antibiotics and discharged home.  I explained to the caregiver that ideally 1 would need a colonoscopy if not had one in the past 1 year after history of diverticulitis.  Assessing the patient I would think that it would be extremely hard if not impossible to put her through a bowel prep and she would be at higher risk for anesthesia.  She has a lot of back issues and will be hard to position her on the bed as well.  Overall the risks of the procedure may be higher than the benefits.  In addition the question is weather  she wants to know  about the diagnosis of any underlying cancer which she in fact stated she was not interested.  I am not clear if the patient makes her own decisions with regards to her health care.  I will have my nurse get in touch with the patient's legal power of attorney holder if available and schedule a follow-up visit to discuss options which would include not doing anything versus noninvasive imaging such as CT colonography which would still require a bowel prep.  Limited to no history was obtained from the patient today some history is obtained from the caregiver.  I have communicated my thoughts with the caregiver to pass on to the staff at the nursing home  Follow up in 4 weeks video visit with legal power of attorney avoid  Dr Jill Mood MD,MRCP(U.K)

## 2020-01-12 ENCOUNTER — Telehealth: Payer: Self-pay | Admitting: Gastroenterology

## 2020-01-12 NOTE — Telephone Encounter (Signed)
LVM on daughters phone to call office to change virtual appt time to 11am or 11:15am..Due to doctor's shedule change

## 2020-01-13 ENCOUNTER — Telehealth (INDEPENDENT_AMBULATORY_CARE_PROVIDER_SITE_OTHER): Payer: Medicare Other | Admitting: Gastroenterology

## 2020-01-13 DIAGNOSIS — Z8719 Personal history of other diseases of the digestive system: Secondary | ICD-10-CM | POA: Diagnosis not present

## 2020-01-13 NOTE — Progress Notes (Addendum)
Jill Mood MD, MRCP(U.K) [address]  [address]  [place], [place] [address]  Main: [phone]  Fax: [phone]   Primary Care Physician: Keane Police, MD  Primary Gastroenterologist:  Dr. Wyline Hancock   Diverticulitis follow-up: Telephone visit  HPI: Jill Hancock is a 79 y.o. female    Summary of history :  Initially referred and seen on 01/01/2020 for rectal bleeding.  She is an exnurse.  Jill Hancock is a 79 y.o. y/o female presented to the emergency room on 12/06/2019 with rectal bleeding.  Noticed for the 2 prior days.  She was on aspirin.  She underwent a CT scan of the abdomen and pelvis with contrast that demonstrated thick-walled urinary bladder with perivascular fat stranding suspicious for cystitis, colonic diverticulosis with possible mild diverticulitis involving proximal sigmoid colon.  Moderate hiatal hernia.  Chronic compression fractures of T12 L3 and L5.  Hemoglobin was 12.6 g with a white cell count of 12.2, creatinine of 1.17.  Urinalysis demonstrated large leukocytes and hemoglobin.  She was given a course of ciprofloxacin and Flagyl discharged with GI follow-up.  At her initial visit she was accompanied by a caregiver does not make decisions on behalf of the patient.  The patient does have issues with memory apparently.  At times is aware of what is going on and at times is not.  She denied having any abdominal pains at this point of time only complaint was about her left knee which apparently she had a fall yesterday and hurt herself.  No other complaints.  She is an exnurse  Interval history 01/01/2019 21-7-second 2021  Spoke with the daughter.  She states that her mother is doing better.  No new complaints.    Current Outpatient Medications  Medication Sig Dispense Refill  . acetaminophen (TYLENOL) 325 MG tablet Take 2 tablets (650 mg total) by mouth every 4 (four) hours as needed for mild pain, moderate pain or fever (or temp > 37.5  C (99.5 F)).    Marland Kitchen alendronate (FOSAMAX) 70 MG tablet Take 1 tablet (70 mg total) by mouth every Monday. 12 tablet 3  . amLODipine (NORVASC) 10 MG tablet TAKE 1 TABLET BY MOUTH EVERY DAY (Patient taking differently: Take 10 mg by mouth at bedtime. ) 90 tablet 1  . aspirin 325 MG tablet Take 325 mg by mouth daily.    Marland Kitchen atorvastatin (LIPITOR) 40 MG tablet Take 1 tablet (40 mg total) by mouth daily at 6 PM. (Patient taking differently: Take 40 mg by mouth at bedtime. ) 30 tablet 2  . Cholecalciferol (VITAMIN D3) 50 MCG (2000 UT) TABS Take 2,000 Units by mouth daily.    . ciprofloxacin (CIPRO) 500 MG tablet Take 1 tablet (500 mg total) by mouth 2 (two) times daily. 14 tablet 0  . divalproex (DEPAKOTE SPRINKLE) 125 MG capsule 125 mg 2 (two) times daily.     Marland Kitchen docusate sodium (COLACE) 100 MG capsule Take 1 capsule (100 mg total) by mouth 2 (two) times daily. 10 capsule 0  . FLUoxetine (PROZAC) 20 MG capsule 20 mg daily.     Marland Kitchen lisinopril (PRINIVIL,ZESTRIL) 40 MG tablet Take 1 tablet (40 mg total) by mouth daily. (Patient taking differently: Take 40 mg by mouth at bedtime. ) 90 tablet 3  . magnesium oxide (MAG-OX) 400 MG tablet Take 200 mg by mouth daily.    . Melatonin 5 MG CAPS Take 1 capsule (5 mg total) by mouth at bedtime.  0  .  meloxicam (MOBIC) 7.5 MG tablet     . memantine (NAMENDA) 10 MG tablet Take 10 mg by mouth 2 (two) times daily.     . metroNIDAZOLE (FLAGYL) 500 MG tablet Take 1 tablet (500 mg total) by mouth 2 (two) times daily. 14 tablet 0  . Multiple Vitamins-Minerals (CERTAVITE/ANTIOXIDANTS) TABS Take by mouth.    . pantoprazole (PROTONIX) 20 MG tablet Take 20 mg by mouth daily.    . traZODone (DESYREL) 100 MG tablet Take 1 tablet (100 mg total) by mouth at bedtime. 20 tablet 0   No current facility-administered medications for this visit.    Allergies as of 01/13/2020 - Review Complete 01/01/2020  Allergen Reaction Noted  . Demerol [meperidine] Nausea And Vomiting 02/24/2012  .  Macrobid [nitrofurantoin macrocrystal] Rash 12/02/2012  . Penicillins Rash 02/24/2012    ROS:  General: Negative for anorexia, weight loss, fever, chills, fatigue, weakness. ENT: Negative for hoarseness, difficulty swallowing , nasal congestion. CV: Negative for chest pain, angina, palpitations, dyspnea on exertion, peripheral edema.  Respiratory: Negative for dyspnea at rest, dyspnea on exertion, cough, sputum, wheezing.  GI: See history of present illness. GU:  Negative for dysuria, hematuria, urinary incontinence, urinary frequency, nocturnal urination.  Endo: Negative for unusual weight change.       Imaging Studies: No results found.  Assessment and Plan:   Jill Hancock is a 79 y.o. y/o female whom I'm speaking to again for a telephone visit to discuss recent admission when she presented with proximal sigmoid diverticulitis to the ER in June 2020.She was treated with antibiotics and discharged home.  I spoke with the daughter today and explained that usually we would perform a colonoscopy to rule out colon cancer if not had one over the past 1 year.  She stated that her mother had never had a colonoscopy.  I did discuss that to perform a colonoscopy she would need to be cleaned out with a bowel prep and with her mental status and inability to do it herself it would practically be hard to do it at the care home she is at.  The daughter agreed, I also discussed that a virtual colonoscopy could also be an option but she would still need to clean out her colon.  We discussed the risks of anesthesia involved with a colonoscopy versus the benefits.  Overall she chose at this point of time to watch and see how her mother does.  If she has no symptoms we will leave everything alone but if she has recurrence of any symptoms she will call our office right away for further evaluation.  Addendum :  Total time of 15 mins non face to face time was spent on the telephone speaking with patients  daughter. I spoke with the patient from my office with the patients daughter at her home.    Dr Jill Mood  MD,MRCP [facility]) Follow up in as needed

## 2020-03-01 ENCOUNTER — Ambulatory Visit: Payer: Medicare Other | Admitting: Gastroenterology

## 2020-03-19 ENCOUNTER — Other Ambulatory Visit: Payer: Self-pay

## 2020-03-19 ENCOUNTER — Non-Acute Institutional Stay: Payer: Medicare Other | Admitting: Primary Care

## 2020-03-19 DIAGNOSIS — Z515 Encounter for palliative care: Secondary | ICD-10-CM

## 2020-03-19 DIAGNOSIS — I6930 Unspecified sequelae of cerebral infarction: Secondary | ICD-10-CM

## 2020-03-19 DIAGNOSIS — F0150 Vascular dementia without behavioral disturbance: Secondary | ICD-10-CM

## 2020-03-19 NOTE — Progress Notes (Signed)
[facility] Consult Note Telephone: [phone]  Fax: [phone]  PATIENT NAME: Jill Hancock [address] [place] [address] [phone] (home)  DOB: 04-19-1941 MRN: [id number]  PRIMARY CARE PROVIDER:    Gennie Alma, MD,  [place] [address] [phone]  REFERRING PROVIDER:   Gennie Alma, [place],  [place] [address]  RESPONSIBLE PARTY:   Extended Emergency Contact Information Primary Emergency Contact: [place] of [place] Phone: [phone] Relation: Daughter Secondary Emergency Contact: Theophilus Kinds Mobile Phone: [phone] Relation: Daughter  I met face to face with patient and family in home/facility.  ASSESSMENT AND RECOMMENDATIONS:   1. Advance Care Planning/Goals of Care: Goals include to maximize quality of life and symptom management. DNR on file.   2. Symptom Management:   Patient was resting when I visited, she was able to smile and participate in the conversation. She denies all complaints, no pain or anxiety. She denies falls, constipation. She endorses she's eating well and content. Staff does not report recent falls or injuries.  Meds were reviewed with staff.   3. Follow up Palliative Care Visit: Palliative care will continue to follow for goals of care clarification and symptom management. Return 6-8 weeks or prn.  4. Family /Caregiver/Community Supports: Daughter is Designer, industrial/product, resident lives in [place].  5. Cognitive / Functional decline: Alert, able to converse. Needs assistance with most adls, all iadls  I spent 25 minutes providing this consultation,  from 1500 to 1525. More than 50% of the time in this consultation was spent coordinating communication.   CHIEF COMPLAINT: dementia  HISTORY OF PRESENT ILLNESS:  JAALA BOHLE is an 79 y.o. year old female with multiple medical problems including anxiety, dementia,  osteoporosis. Palliative Care was asked to follow this patient by consultation request of Gennie Alma, MD to help address advance care planning and goals of care. This is a follow up visit.  CODE STATUS: DNR PPS: 40%  HOSPICE ELIGIBILITY/DIAGNOSIS:  no  PAST MEDICAL HISTORY:  Past Medical History:  Diagnosis Date  . Anxiety   . Depression   . Hyperglycemia   . Hypertension   . Hypokalemia   . Osteoporosis   . Stroke [facility]) 02/2012   MRI revealed at least 3 subcentimeter acute infarctions with one area of subacute infarction in widely disparate vascular territories including territory of left cerebellum and around right caudate nucleus along with widespread lacunar infarcts, chronic microvascular ischemic change, and numerous microbleeds suggesting long standing hypertensive cerebrovascular disease.  MRA confirmed intracranial  . Unsteady gait   . Vertigo     SOCIAL HX:  Social History   Tobacco Use  . Smoking status: Never Smoker  . Smokeless tobacco: Never Used  Substance Use Topics  . Alcohol use: No    Alcohol/week: 0.0 standard drinks   FAMILY HX:  Family History  Problem Relation Age of Onset  . Heart attack Mother   . Heart disease Mother   . Heart disease Father   . Heart attack Brother        death at age 3  . Stroke Brother        death at 73  . Bladder Cancer Neg Hx   . Kidney cancer Neg Hx     ALLERGIES:  Allergies  Allergen Reactions  . Demerol [Meperidine] Nausea And Vomiting  . Macrobid [Nitrofurantoin Macrocrystal] Rash  . Penicillins Rash    Has patient had a  PCN reaction causing immediate rash, facial/tongue/throat swelling, SOB or lightheadedness with hypotension: Unknown Has patient had a PCN reaction causing severe rash involving mucus membranes or skin necrosis: Unknown Has patient had a PCN reaction that required hospitalization: Unknown Has patient had a PCN reaction occurring within the last 10 years: No If all of the above answers are  "NO", then may proceed with Cephalosporin use.      PERTINENT MEDICATIONS:  Outpatient Encounter Medications as of 03/19/2020  Medication Sig  . acetaminophen (TYLENOL) 325 MG tablet Take 2 tablets (650 mg total) by mouth every 4 (four) hours as needed for mild pain, moderate pain or fever (or temp > 37.5 C (99.5 F)).  Marland Kitchen alendronate (FOSAMAX) 70 MG tablet Take 1 tablet (70 mg total) by mouth every Monday.  Marland Kitchen amLODipine (NORVASC) 10 MG tablet TAKE 1 TABLET BY MOUTH EVERY DAY (Patient taking differently: Take 10 mg by mouth at bedtime. )  . atorvastatin (LIPITOR) 40 MG tablet Take 1 tablet (40 mg total) by mouth daily at 6 PM. (Patient taking differently: Take 40 mg by mouth at bedtime. )  . Cholecalciferol (VITAMIN D3) 50 MCG (2000 UT) TABS Take 2,000 Units by mouth daily.  . divalproex (DEPAKOTE SPRINKLE) 125 MG capsule 125 mg 2 (two) times daily.   Marland Kitchen docusate sodium (COLACE) 100 MG capsule Take 1 capsule (100 mg total) by mouth 2 (two) times daily.  Marland Kitchen FLUoxetine (PROZAC) 20 MG capsule 20 mg daily.   Marland Kitchen lisinopril (PRINIVIL,ZESTRIL) 40 MG tablet Take 1 tablet (40 mg total) by mouth daily. (Patient taking differently: Take 40 mg by mouth at bedtime. )  . magnesium oxide (MAG-OX) 400 MG tablet Take 200 mg by mouth daily.  . Melatonin 5 MG CAPS Take 1 capsule (5 mg total) by mouth at bedtime.  . memantine (NAMENDA) 10 MG tablet Take 10 mg by mouth 2 (two) times daily.   . Multiple Vitamins-Minerals (CERTAVITE/ANTIOXIDANTS) TABS Take by mouth.  . pantoprazole (PROTONIX) 20 MG tablet Take 20 mg by mouth daily.  Marland Kitchen senna (SENOKOT) 8.6 MG TABS tablet Take 2 tablets by mouth daily.  . sucralfate (CARAFATE) 1 GM/10ML suspension Take 1 g by mouth 4 (four) times daily -  with meals and at bedtime.  . traZODone (DESYREL) 100 MG tablet Take 1 tablet (100 mg total) by mouth at bedtime.  . trolamine salicylate (ASPERCREME) 10 % cream Apply 1 application topically 3 (three) times daily as needed for muscle pain.   . [DISCONTINUED] aspirin 325 MG tablet Take 325 mg by mouth daily.  . [DISCONTINUED] ciprofloxacin (CIPRO) 500 MG tablet Take 1 tablet (500 mg total) by mouth 2 (two) times daily.  . [DISCONTINUED] esomeprazole (NEXIUM) 20 MG capsule TAKE 1 CAPSULE (20 MG TOTAL) BY MOUTH DAILY BEFORE BREAKFAST.  . [DISCONTINUED] meloxicam (MOBIC) 7.5 MG tablet   . [DISCONTINUED] metroNIDAZOLE (FLAGYL) 500 MG tablet Take 1 tablet (500 mg total) by mouth 2 (two) times daily.   No facility-administered encounter medications on file as of 03/19/2020.    PHYSICAL EXAM / ROS:   Current and past weights: unavailable General: NAD, frail appearing, thin Cardiovascular: S1S2, no chest pain reported, no  LE edema  Pulmonary: LCTA, no cough, no increased SOB, room air Abdomen: appetite fair, endorses  occ constipation, incontinent of bowel  GU: denies dysuria, incontinent of urine MSK: +o joint and ROM abnormalities, needs help with ambulation/transfers Skin: no rashes or wounds reported Neurological: Weakness, verbal, oriented x 1-2. Denies pain, staff denies dementia  behavioral disturbances.  Jason Coop, NP , DNP, MPH, [facility]  COVID-19 PATIENT SCREENING TOOL  Person answering questions: ____________staff______ _____   1.  Is the patient or any family member in the home showing any signs or symptoms regarding respiratory infection?               Person with Symptom- __________NA_________________  a. Fever                                                                          Yes___ No___          ___________________  b. Shortness of breath                                                    Yes___ No___          ___________________ c. Cough/congestion                                       Yes___  No___         ___________________ d. Body aches/pains                                                         Yes___ No___        ____________________ e. Gastrointestinal symptoms (diarrhea, nausea)            Yes___ No___        ____________________  2. Within the past 14 days, has anyone living in the home had any contact with someone with or under investigation for COVID-19?    Yes___ No_X_   Person __________________

## 2020-06-24 ENCOUNTER — Non-Acute Institutional Stay: Payer: Medicare Other | Admitting: Primary Care

## 2020-06-24 ENCOUNTER — Other Ambulatory Visit: Payer: Self-pay

## 2020-06-24 DIAGNOSIS — Z515 Encounter for palliative care: Secondary | ICD-10-CM

## 2020-06-24 DIAGNOSIS — F0150 Vascular dementia without behavioral disturbance: Secondary | ICD-10-CM

## 2020-06-24 DIAGNOSIS — I6930 Unspecified sequelae of cerebral infarction: Secondary | ICD-10-CM
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
## 2020-08-12 ENCOUNTER — Other Ambulatory Visit: Payer: Self-pay

## 2020-08-12 ENCOUNTER — Non-Acute Institutional Stay: Payer: Medicare Other | Admitting: Primary Care

## 2020-08-12 DIAGNOSIS — F0150 Vascular dementia without behavioral disturbance: Secondary | ICD-10-CM

## 2020-08-12 DIAGNOSIS — I6930 Unspecified sequelae of cerebral infarction: Secondary | ICD-10-CM

## 2020-08-12 DIAGNOSIS — Z515 Encounter for palliative care: Secondary | ICD-10-CM
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
## 2020-10-22 ENCOUNTER — Other Ambulatory Visit: Payer: Self-pay

## 2020-10-22 ENCOUNTER — Non-Acute Institutional Stay: Payer: Medicare Other | Admitting: Primary Care

## 2020-10-22 DIAGNOSIS — S4991XS Unspecified injury of right shoulder and upper arm, sequela: Secondary | ICD-10-CM

## 2020-10-22 DIAGNOSIS — Z515 Encounter for palliative care: Secondary | ICD-10-CM

## 2020-10-22 DIAGNOSIS — F0150 Vascular dementia without behavioral disturbance: Secondary | ICD-10-CM

## 2020-10-22 NOTE — Progress Notes (Signed)
Therapist, nutritional Palliative Care Consult Note Telephone: [phone]  Fax: [phone]    Date of encounter: 10/22/20 PATIENT NAME: Jill Hancock [address] [place] [address]   [phone] (home)  DOB: 1941/01/22 MRN: [id number] PRIMARY CARE PROVIDER:     Frederich Chick MD [facility]   REFERRING PROVIDER:    Frederich Chick MD [facility]   RESPONSIBLE PARTY:    Contact Information    Name Relation Home Work Mobile   Jill Hancock Daughter   [phone]   Jill Hancock Daughter   [phone]      I met face to face with patient  in [facility] facility. Palliative Care was asked to follow this patient by consultation request of  Dr Jill Hancock  to address advance care planning and complex medical decision making. This is a follow up visit.                                   ASSESSMENT AND PLAN / RECOMMENDATIONS:    Advance Care Planning/Goals of Care: Goals include to maximize quality of life and symptom management.   CODE STATUS: DNR  MOST with DNR, limited scope, use of abx and iv, trial feeding tube  T/c to daughter Jill Hancock, did not reach her, left message for f/u if desired.  Symptom Management/Plan:  Mobility: Patient able to mobilize in w/c in halls. Progressively non verbal now only able to whisper, but does answer short answers appropriately. No falls reported, as pt is in w/c now exclusively.   Pain is managed on Tramadol 50 mg tid. Continue current protocol.  Nutrition: Has had 15 lb wt loss in 9 months, recommend supplement like med pass. Recommend weekly weights. Recommend nutrition f/u.  Follow up Palliative Care Visit: Palliative care will continue to follow for complex medical decision making, advance care planning, and clarification of goals. Return 12 weeks or prn.  I spent 35 minutes providing this consultation. More than 50% of the time in this consultation was spent in counseling and care coordination.  PPS: 30%  HOSPICE  ELIGIBILITY/DIAGNOSIS: TBD  Chief Complaint: immobility  HISTORY OF PRESENT ILLNESS:  Jill Hancock is a 80 y.o. year old female  with dementia, chronic pain.  History obtained from review of EMR, discussion with primary team, and interview with family, facility staff/caregiver and/or Jill Hancock.  I reviewed available labs, medications, imaging, studies and related documents from the EMR.  Records reviewed and summarized above.   Outpatient Encounter Medications as of 10/22/2020  Medication Sig  . acetaminophen (TYLENOL) 325 MG tablet Take 2 tablets (650 mg total) by mouth every 4 (four) hours as needed for mild pain, moderate pain or fever (or temp > 37.5 C (99.5 F)). (Patient taking differently: Take 650 mg by mouth in the morning, at noon, in the evening, and at bedtime.)  . alendronate (FOSAMAX) 70 MG tablet Take 1 tablet (70 mg total) by mouth every Monday.  Marland Kitchen amLODipine (NORVASC) 10 MG tablet TAKE 1 TABLET BY MOUTH EVERY DAY (Patient taking differently: Take 10 mg by mouth at bedtime.)  . atorvastatin (LIPITOR) 40 MG tablet Take 1 tablet (40 mg total) by mouth daily at 6 PM. (Patient taking differently: Take 40 mg by mouth at bedtime.)  . Cholecalciferol (VITAMIN D3) 50 MCG (2000 UT) TABS Take 2,000 Units by mouth daily.  . divalproex (DEPAKOTE SPRINKLE) 125 MG capsule 125 mg 2 (two)  times daily.   Marland Kitchen docusate (COLACE) 50 MG/5ML liquid Take 100 mg by mouth daily.  Marland Kitchen FLUoxetine (PROZAC) 20 MG capsule 20 mg daily.   Marland Kitchen lisinopril (PRINIVIL,ZESTRIL) 40 MG tablet Take 1 tablet (40 mg total) by mouth daily. (Patient taking differently: Take 40 mg by mouth at bedtime.)  . magnesium oxide (MAG-OX) 400 MG tablet Take 200 mg by mouth daily.  . Melatonin 3 MG CAPS Take by mouth.  . memantine (NAMENDA) 10 MG tablet Take 10 mg by mouth 2 (two) times daily.   . Multiple Vitamins-Minerals (CERTAVITE/ANTIOXIDANTS) TABS Take by mouth.  . pantoprazole (PROTONIX) 20 MG tablet Take 40 mg by mouth daily.   Marland Kitchen senna (SENOKOT) 8.6 MG TABS tablet Take 2 tablets by mouth daily.  . sucralfate (CARAFATE) 1 GM/10ML suspension Take 1 g by mouth 4 (four) times daily -  with meals and at bedtime.  . traMADol (ULTRAM) 50 MG tablet Take 50 mg by mouth 3 (three) times daily as needed for moderate pain.  . traZODone (DESYREL) 50 MG tablet Take 50 mg by mouth at bedtime.  . trolamine salicylate (ASPERCREME) 10 % cream Apply 1 application topically 3 (three) times daily as needed for muscle pain.  . [DISCONTINUED] docusate sodium (COLACE) 100 MG capsule Take 1 capsule (100 mg total) by mouth 2 (two) times daily.  . [DISCONTINUED] esomeprazole (NEXIUM) 20 MG capsule TAKE 1 CAPSULE (20 MG TOTAL) BY MOUTH DAILY BEFORE BREAKFAST.  . [DISCONTINUED] Melatonin 5 MG CAPS Take 1 capsule (5 mg total) by mouth at bedtime.   No facility-administered encounter medications on file as of 10/22/2020.     ROS/staff  General: NAD ENMT: denies dysphagia Cardiovascular: denies chest pain, denies DOE Pulmonary: denies cough, denies increased SOB Abdomen: endorses fair  appetite, denies constipation, endorses incontinence of bowel GU: denies dysuria, endorses incontinence of urine MSK: endorses weakness,  no falls reported Skin: denies rashes or wounds, shoulder protection pad Neurological:  painad 6/10, denies insomnia Psych: Endorses positive mood Heme/lymph/immuno: denies bruises, abnormal bleeding  Physical Exam: Current and past weights:125 lbs,  139  lbs in 8/21,  15  lbs weight loss,  Staff to f/u. Constitutional: NAD General: frail appearing, thin  EYES: anicteric sclera, lids intact, no discharge  ENMT: intact hearing, oral mucous membranes moist, dentition intact CV:  no LE edema Pulmonary:  no increased work of breathing, no cough, room air Abdomen: intake 50-75%, no ascites GU: deferred MSK: severe sarcopenia, moves all extremities,  nonambulatory Skin: warm and dry, no rashes or wounds on visible  skin Neuro:  +generalized weakness,  Severe  cognitive impairment Psych: non-anxious affect, A and O x 1 Hem/lymph/immuno: no widespread bruising  Thank you for the opportunity to participate in the care of Jill Hancock.  The palliative care team will continue to follow. Please call our office at [phone] if we can be of additional assistance.   Jason Coop, NP , DNP, MPH, AGPCNP-BC, A[facility]PN  COVID-19 PATIENT SCREENING TOOL Asked and negative response unless otherwise noted:   Have you had symptoms of covid, tested positive or been in contact with someone with symptoms/positive test in the past 5-10 days?

## 2020-12-17 IMAGING — CR CHEST  1 VIEW
1 series · 1 of 1 positions shown · non-contrast
Comparison: Chest CT 12/31/2017 and earlier.

CLINICAL DATA: 77-year-old female status post fall with left hip
fracture.

EXAM:
CHEST  1 VIEW

[chest ap]
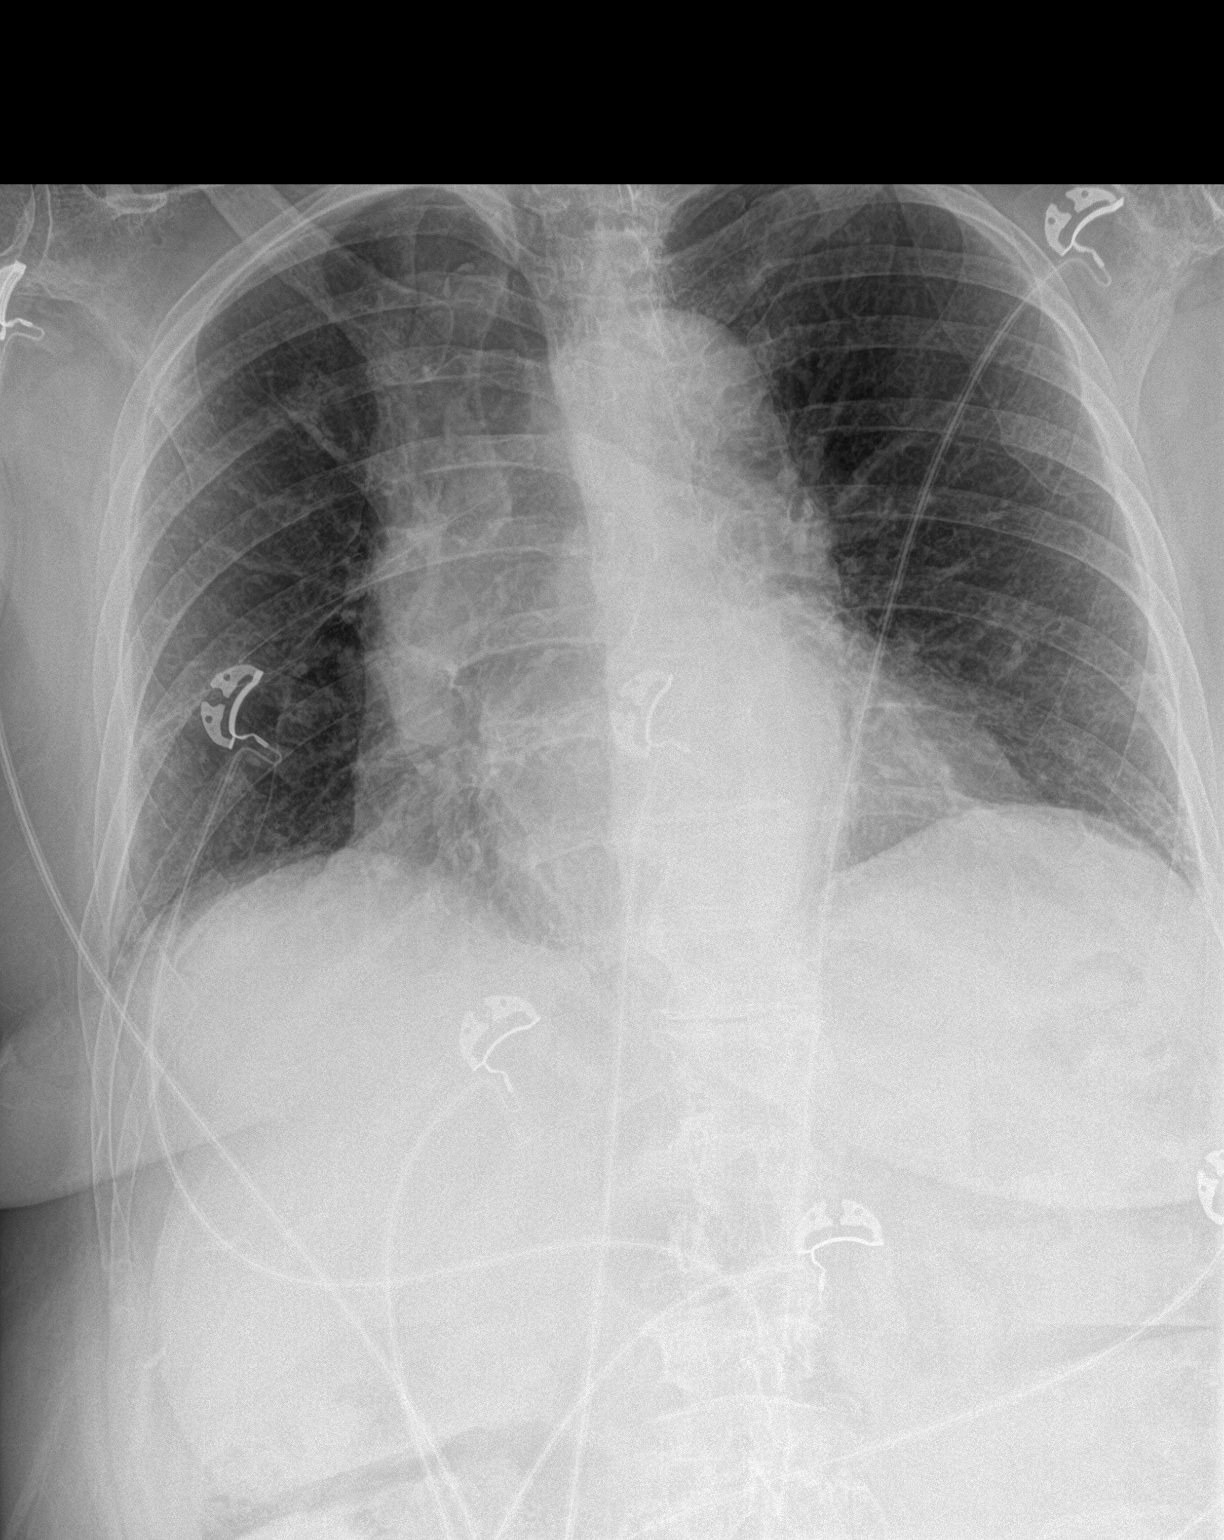

[1 of 1 positions shown; findings below may reference images not displayed]

FINDINGS: Supine AP view at 0086 hours. Stable lung volumes and mediastinal
contours. Chronic pulmonary interstitial opacity which might
correspond to a degree of developing fibrosis. No pneumothorax,
pleural effusion or acute pulmonary opacity. Visualized tracheal air
column is within normal limits. Stable visualized osseous
structures.
IMPRESSION: No acute cardiopulmonary abnormality. Questionable chronic pulmonary
interstitial disease.

## 2020-12-17 IMAGING — DX DG HIP (WITH OR WITHOUT PELVIS) 1V PORT LEFT
2 series · 2 of 2 positions shown · non-contrast
Comparison: Left hip x-rays from same day.

CLINICAL DATA: Left hip ORIF.

EXAM:
DG HIP (WITH OR WITHOUT PELVIS) 1V PORT LEFT

[pelvis ap]
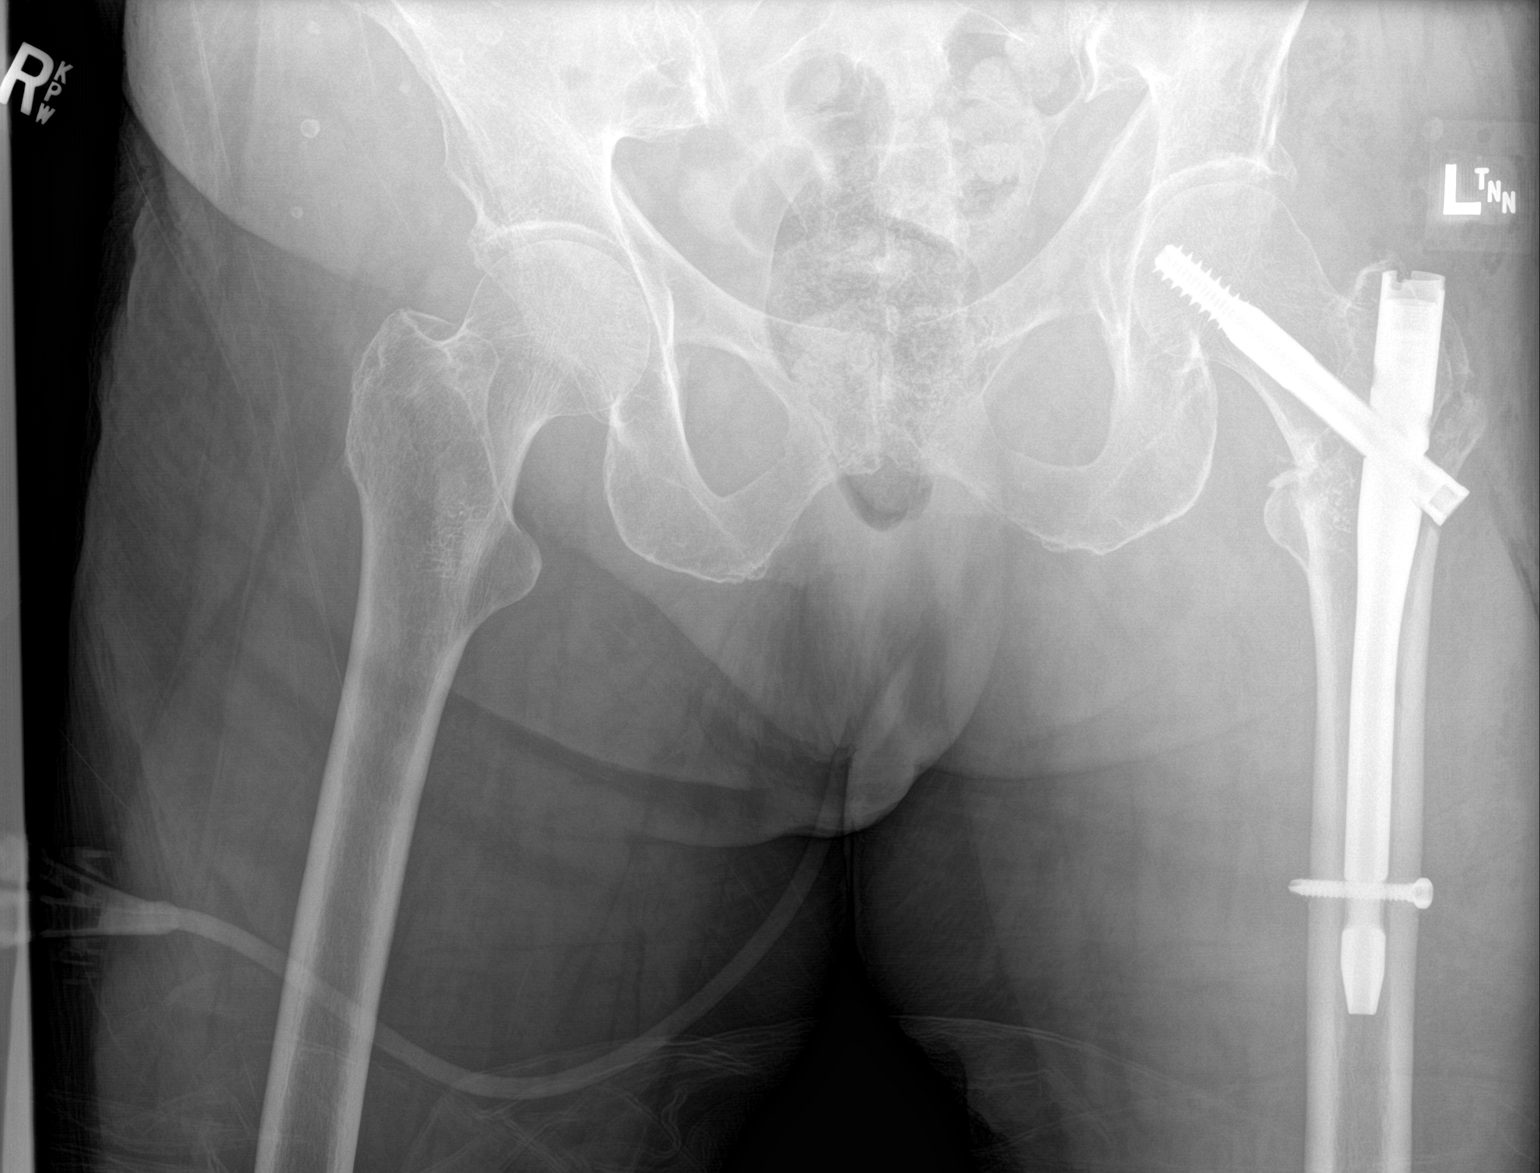

[hip lat]
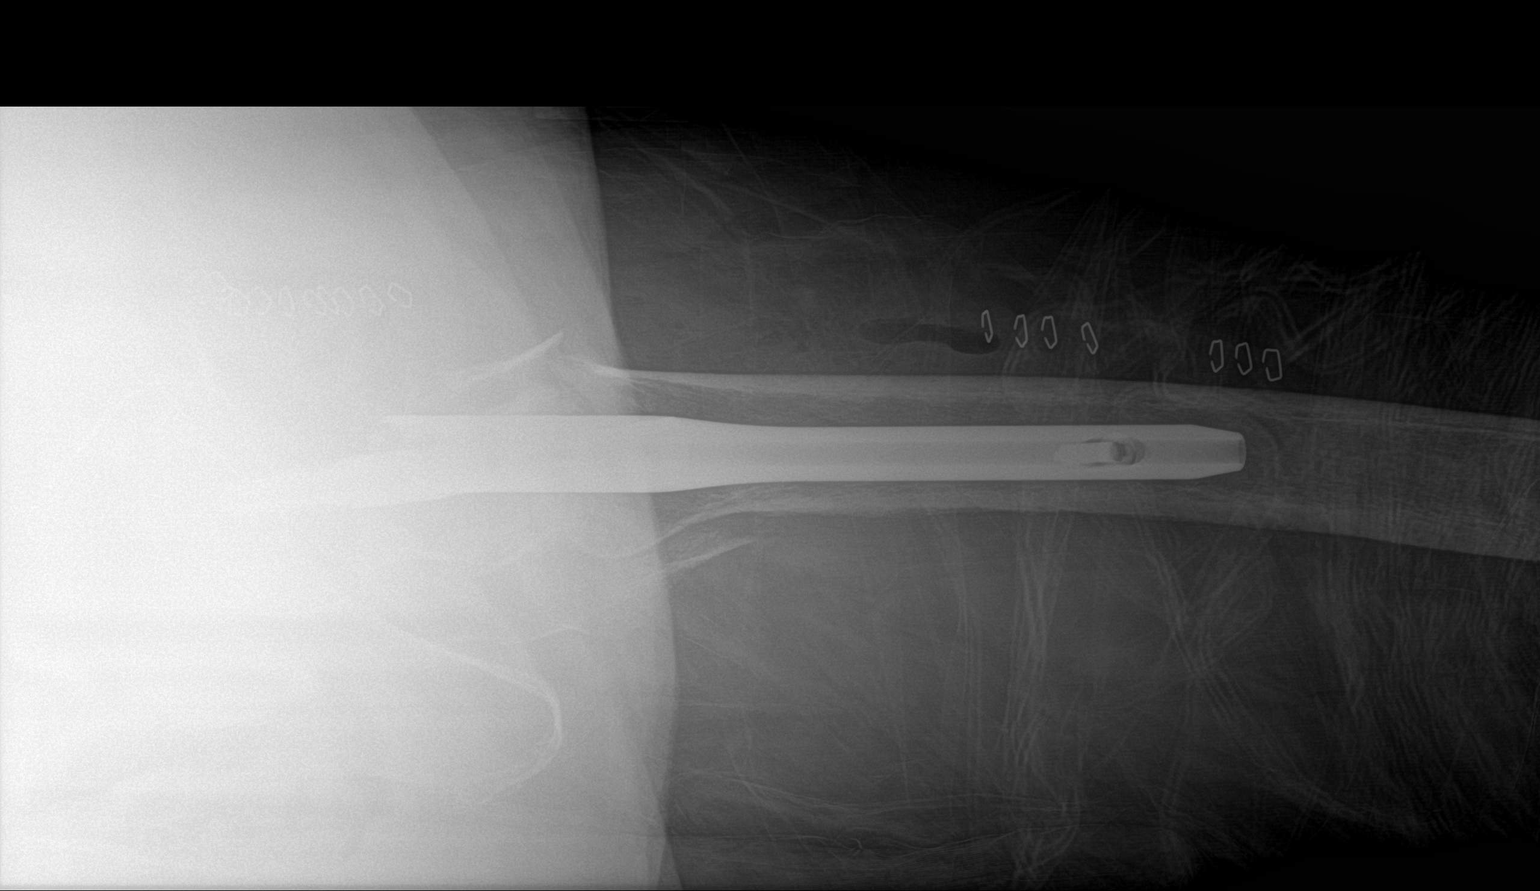

[2 of 2 positions shown; findings below may reference images not displayed]

FINDINGS: Interval gamma nail fixation of the left intertrochanteric femur
fracture, in anatomic alignment. Hip joint spaces are preserved.
Osteopenia. Expected postsurgical changes in the soft tissues
surrounding the left hip.
IMPRESSION: 1. Interval left intertrochanteric femur fracture ORIF without acute
postoperative complication.

## 2020-12-18 IMAGING — CR LUMBAR SPINE - 2-3 VIEW
1 series · 3 of 3 positions shown · non-contrast
Comparison: Prior MRI from 02/25/2018

CLINICAL DATA: Initial evaluation for back pain.

EXAM:
LUMBAR SPINE - 2-3 VIEW

[Series 1: dg lumbar spine 2-3 views · 0.14mm/px · 3 of 3 slices shown]
[im 1/3]
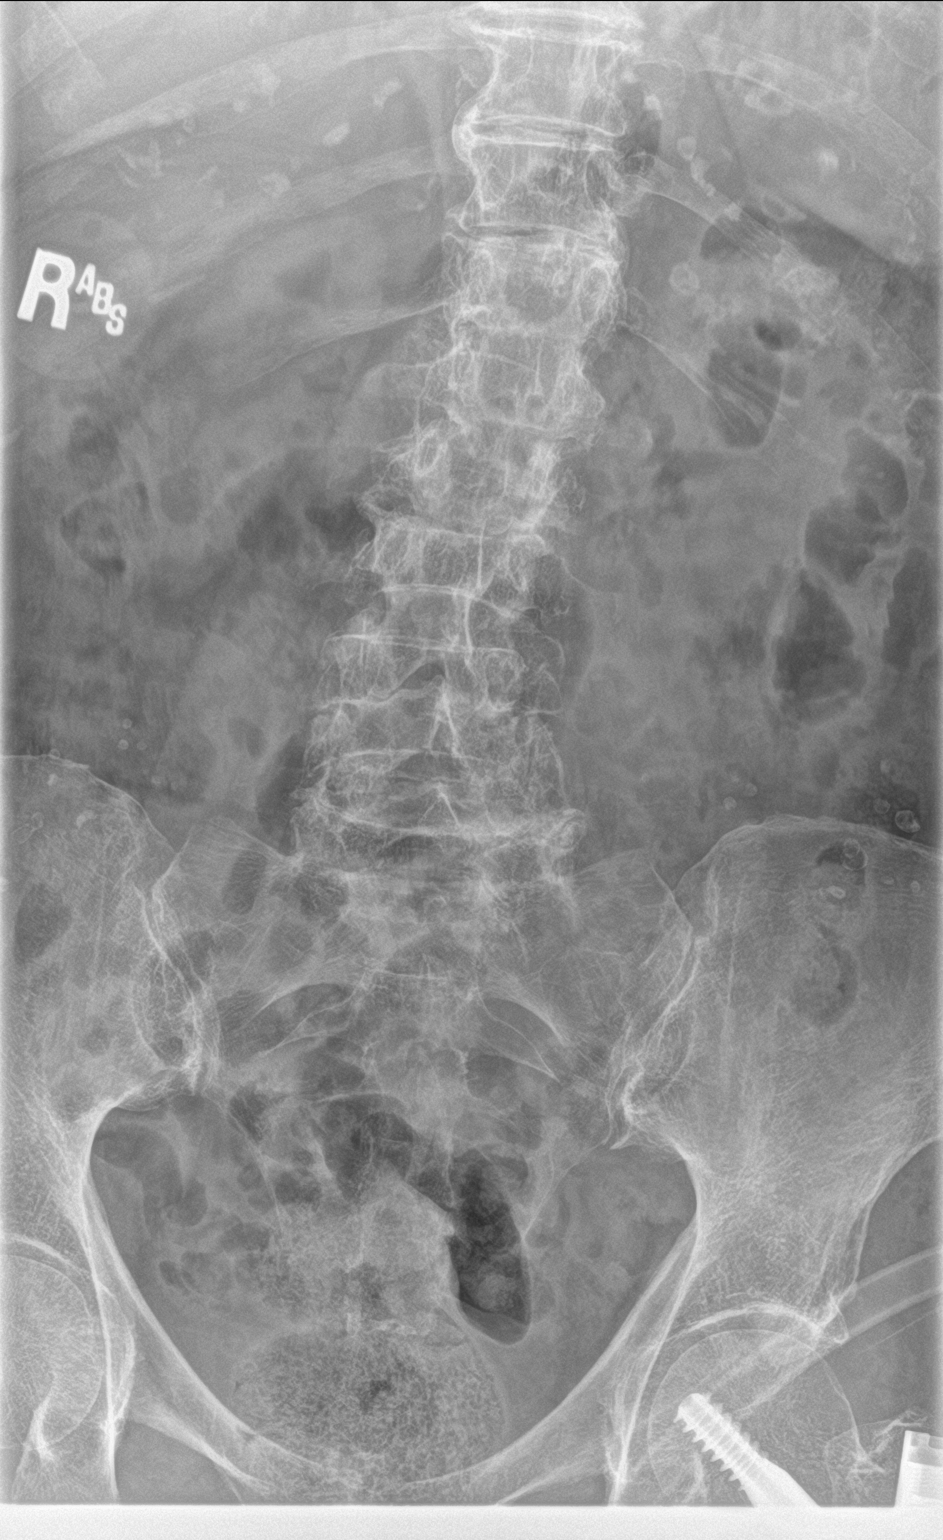
[im 2/3]
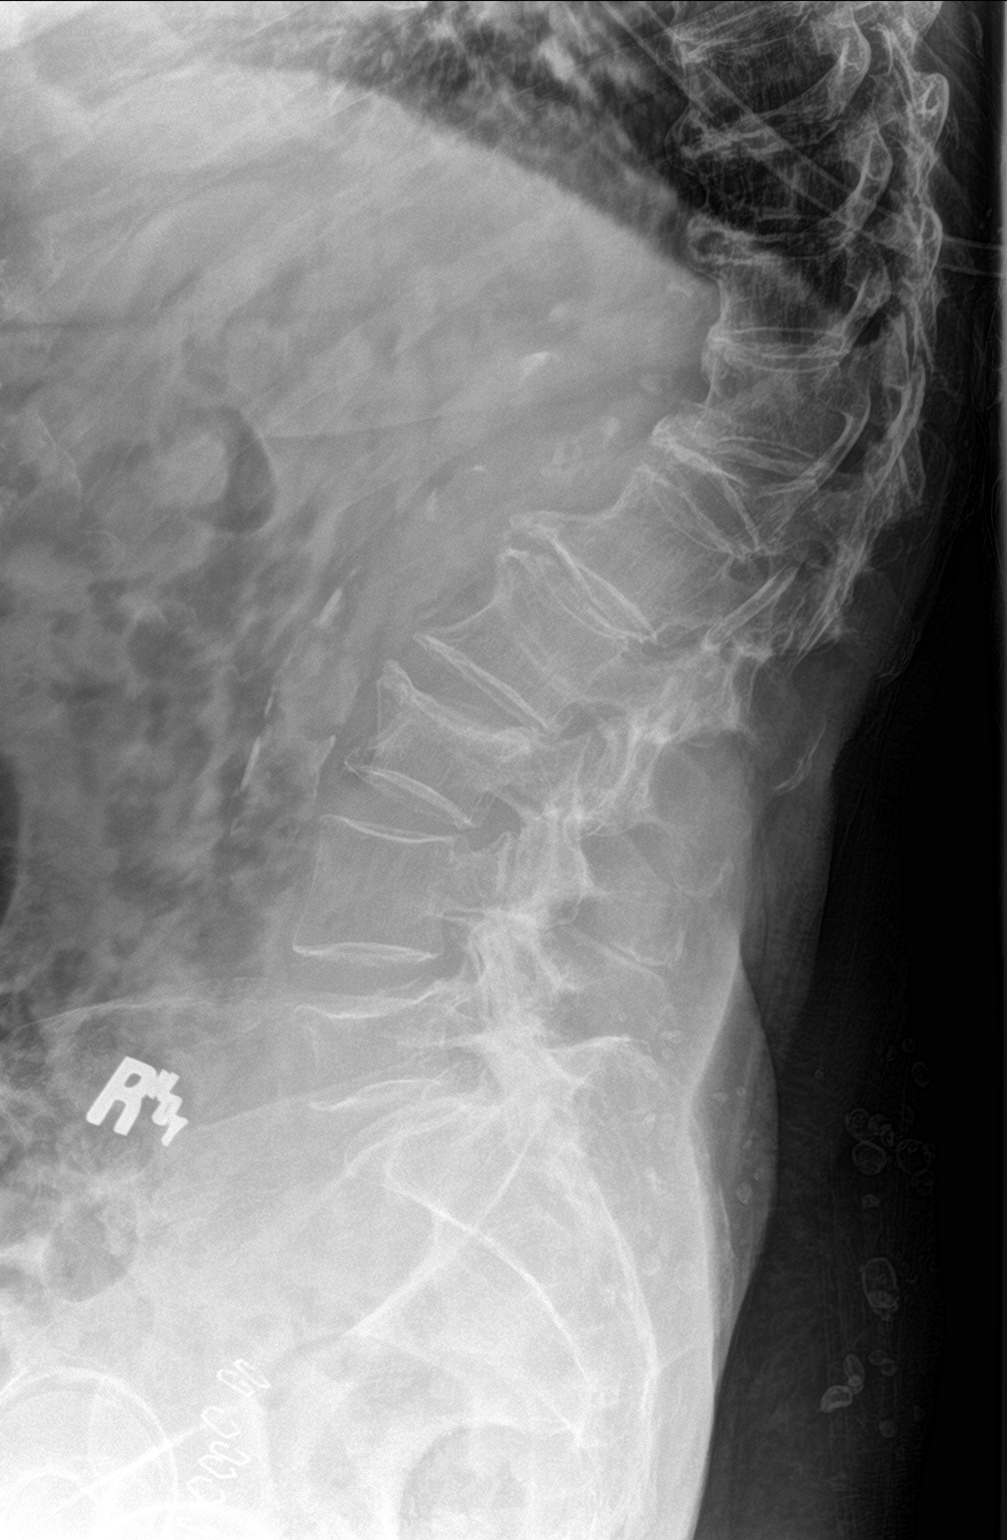
[im 3/3]
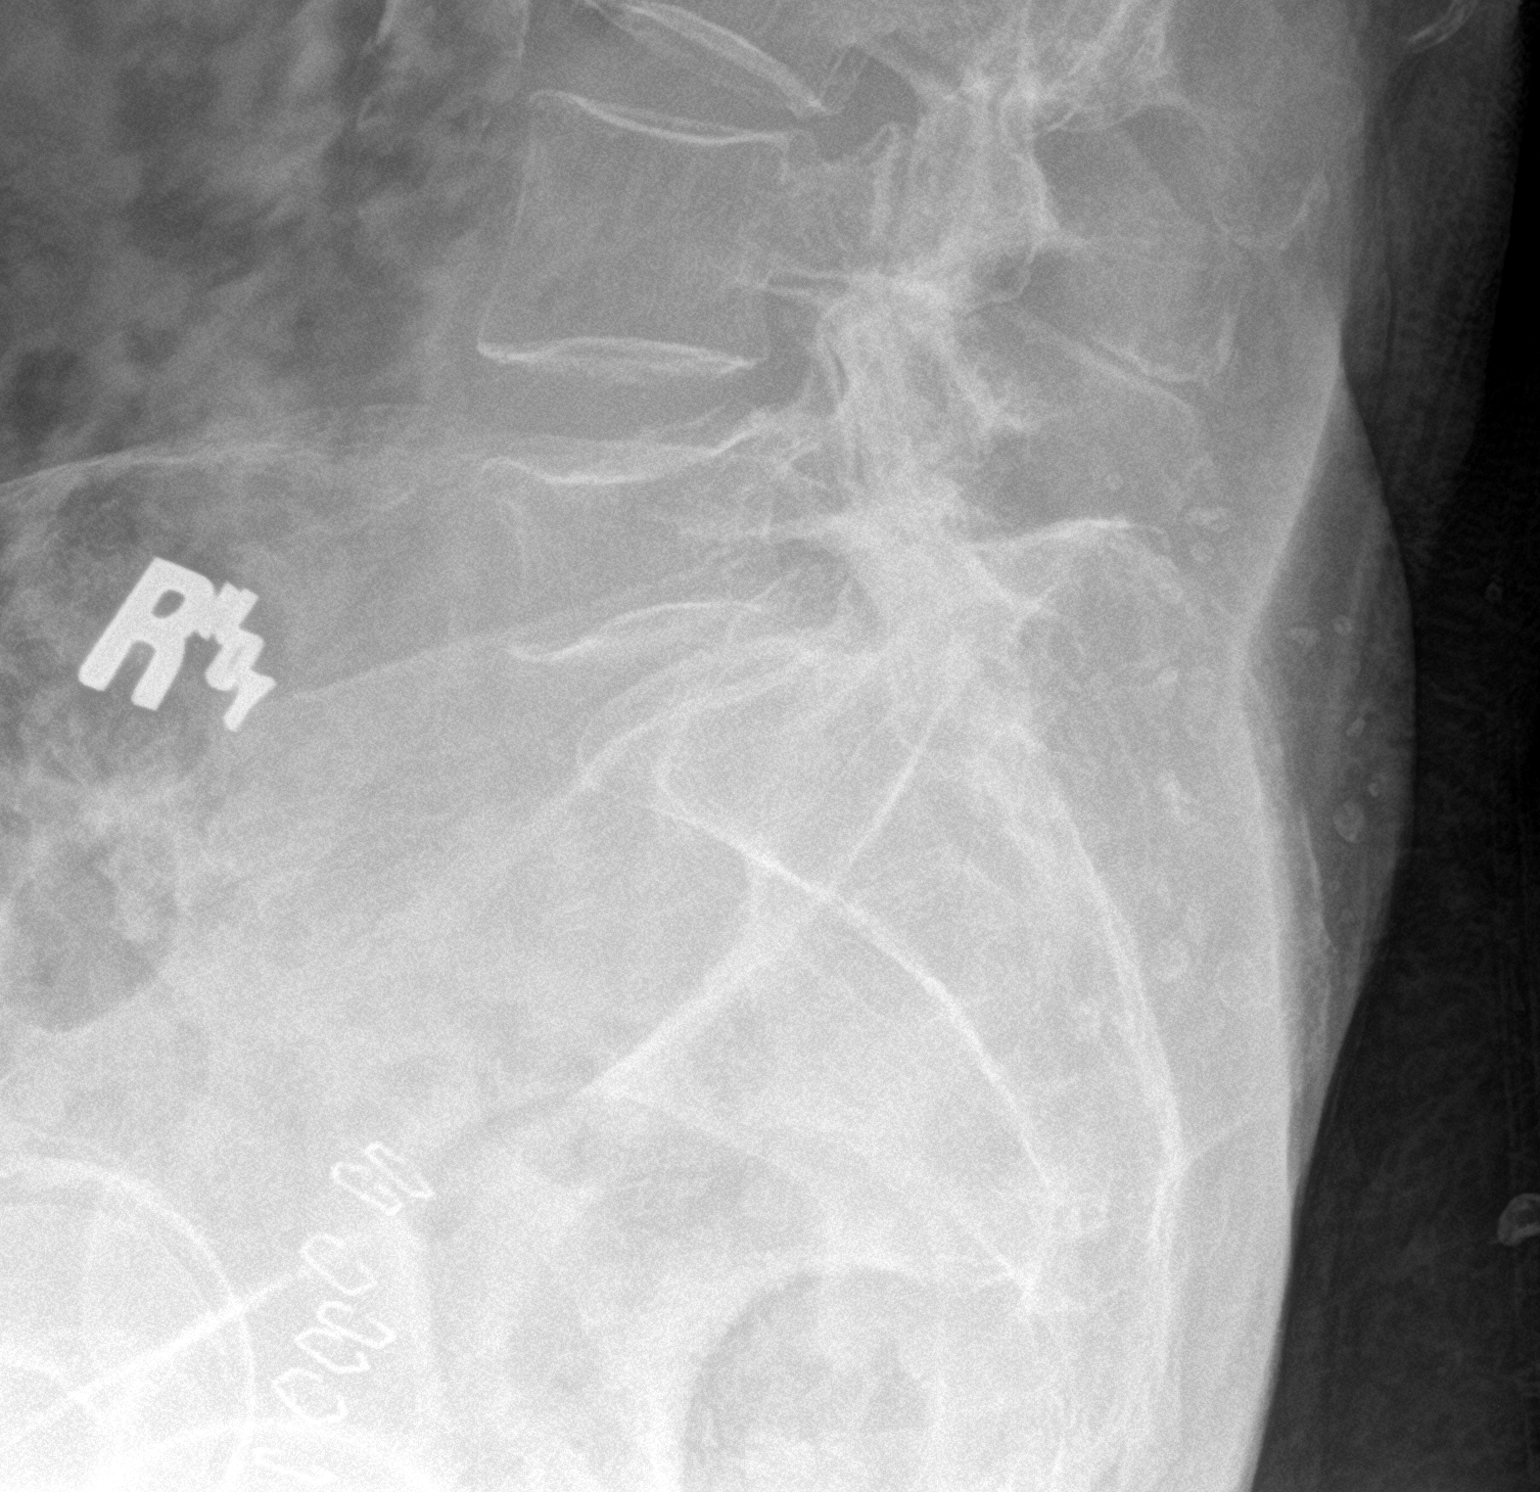

[3 of 3 positions shown; findings below may reference images not displayed]

FINDINGS: Five non rib-bearing lumbar type vertebral bodies. Mild
dextroscoliosis. Trace anterolisthesis of L5 on S1. Alignment
otherwise normal with preservation of the normal lumbar lordosis.
Vertebral body height loss involving the T12, L3, and L5 vertebral
bodies is stable from previous. No interval or acute vertebral body
fracture. Visualized sacrum and pelvis intact. Osteopenia.

Moderate multilevel degenerative spondylolysis, most notable at
L1-2, stable from previous.

No acute soft tissue abnormality. Extensive aortic atherosclerosis
noted. Sequelae of prior ORIF at the left hip with overlying skin
staples, partially visualized.
IMPRESSION: 1. No radiographic evidence for acute abnormality within the lumbar
spine.
2. Chronic compression deformities involving the T12, L3, and L5
vertebral bodies, stable.
3. Moderate multilevel degenerative spondylolysis, most notable at
L1-2.
4. Extensive aortic atherosclerosis.

## 2020-12-24 ENCOUNTER — Non-Acute Institutional Stay: Payer: Medicare Other | Admitting: Primary Care

## 2020-12-24 ENCOUNTER — Other Ambulatory Visit: Payer: Self-pay

## 2020-12-24 DIAGNOSIS — L89102 Pressure ulcer of unspecified part of back, stage 2: Secondary | ICD-10-CM | POA: Insufficient documentation

## 2020-12-24 DIAGNOSIS — Z515 Encounter for palliative care: Secondary | ICD-10-CM

## 2020-12-24 DIAGNOSIS — F0150 Vascular dementia without behavioral disturbance: Secondary | ICD-10-CM

## 2020-12-24 NOTE — Progress Notes (Signed)
Therapist, nutritional Palliative Care Consult Note Telephone: [phone]  Fax: [phone]    Date of encounter: 12/24/20 PATIENT NAME: Jill Hancock [address] [place] [address]   [phone] (home)  DOB: May 07, 1941 MRN: [id number] PRIMARY CARE PROVIDER:    Clovis Cao, MD,  [address] [place] [address] [phone]  REFERRING PROVIDER:   Clovis Cao, MD [address] [place],  [place] [address] [phone]  RESPONSIBLE PARTY:    Contact Information     Name Relation Home Work Mobile   Boone,[name] Daughter   [phone]   Rodena Goldmann Daughter   [phone]       I met face to face with patient  in [facility] facility. Palliative Care was asked to follow this patient by consultation request of  Clovis Cao, MD to address advance care planning and complex medical decision making. This is a follow up visit.                                   ASSESSMENT AND PLAN / RECOMMENDATIONS:   Advance Care Planning/Goals of Care: Goals include to maximize quality of life and symptom management. Our advance care planning conversation included a discussion about:    CODE STATUS: DNR  Symptom Management/Plan:  I met with patient in her nursing home room. She was resting in bed and speaking with me. Today she was more animated than usual, smiling and speaking and answering questions appropriately. In previous interviews she has not made eye contact or tried to verbally interact. 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 They do have wound  management caring for this area.   Staff also reports some G.I. upset and diarrhea. There is a gastrointestinal virus in the general community. Staff endorsed that she still eating and drinking fairly well but will monitor her diarrhea and intake.  Follow up Palliative Care Visit: Palliative care will continue to follow for complex medical decision making,  advance care planning, and clarification of goals. Return 6 weeks or prn.  I spent 25 minutes providing this consultation. More than 50% of the time in this consultation was spent in counseling and care coordination.   PPS: 30%  HOSPICE ELIGIBILITY/DIAGNOSIS: TBD  Chief Complaint: debility  HISTORY OF PRESENT ILLNESS:  Jill Hancock is a 80 y.o. year old female  with dementia (vascular), recent stage 2 pressure injury on back, debility .   History obtained from review of EMR, discussion with primary team, and interview with family, facility staff/caregiver and/or Ms. Rossa.  I reviewed available labs, medications, imaging, studies and related documents from the EMR.  Records reviewed and summarized above.   ROS/staff   General: NAD ENMT: denies dysphagia Cardiovascular: denies chest pain, denies DOE Pulmonary: denies cough, denies increased SOB Abdomen: endorses good appetite, denies constipation, endorses continence of bowel GU: denies dysuria, endorses continence of urine MSK:  denies weakness,  no falls reported Skin: denies rashes or wounds Neurological: denies pain, denies insomnia Psych: Endorses positive mood Heme/lymph/immuno: denies bruises, abnormal bleeding  Physical Exam: Current and past weights: stable Constitutional: NAD General: frail appearing, thin EYES: anicteric sclera, lids intact, no discharge  ENMT: intact hearing, oral mucous membranes moist, dentition intact CV: S1S2, RRR Pulmonary: no increased work of breathing, no cough, room air Abdomen: intake 50%, no ascites GU: deferred MSK: ++ sarcopenia, moves  all extremities, non ambulatory Skin: warm and dry Neuro:  ++generalized weakness,  severe cognitive impairment Psych: non-anxious affect, A and O x 1 Hem/lymph/immuno: no widespread bruising  Outpatient Encounter Medications as of 12/24/2020  Medication Sig   acetaminophen (TYLENOL) 325 MG tablet Take 2 tablets (650 mg total) by mouth  every 4 (four) hours as needed for mild pain, moderate pain or fever (or temp > 37.5 C (99.5 F)). (Patient taking differently: Take 650 mg by mouth in the morning, at noon, in the evening, and at bedtime.)   alendronate (FOSAMAX) 70 MG tablet Take 1 tablet (70 mg total) by mouth every Monday.   amLODipine (NORVASC) 10 MG tablet TAKE 1 TABLET BY MOUTH EVERY DAY (Patient taking differently: Take 10 mg by mouth at bedtime.)   atorvastatin (LIPITOR) 40 MG tablet Take 1 tablet (40 mg total) by mouth daily at 6 PM. (Patient taking differently: Take 40 mg by mouth at bedtime.)   Cholecalciferol (VITAMIN D3) 50 MCG (2000 UT) TABS Take 2,000 Units by mouth daily.   divalproex (DEPAKOTE SPRINKLE) 125 MG capsule 125 mg 2 (two) times daily.    docusate (COLACE) 50 MG/5ML liquid Take 100 mg by mouth daily.   FLUoxetine (PROZAC) 20 MG capsule 20 mg daily.    lisinopril (PRINIVIL,ZESTRIL) 40 MG tablet Take 1 tablet (40 mg total) by mouth daily. (Patient taking differently: Take 40 mg by mouth at bedtime.)   magnesium oxide (MAG-OX) 400 MG tablet Take 200 mg by mouth daily.   Melatonin 3 MG CAPS Take by mouth.   memantine (NAMENDA) 10 MG tablet Take 10 mg by mouth 2 (two) times daily.    Multiple Vitamins-Minerals (CERTAVITE/ANTIOXIDANTS) TABS Take by mouth.   pantoprazole (PROTONIX) 20 MG tablet Take 40 mg by mouth daily.   senna (SENOKOT) 8.6 MG TABS tablet Take 2 tablets by mouth daily.   traMADol (ULTRAM) 50 MG tablet Take 50 mg by mouth 3 (three) times daily as needed for moderate pain.   traZODone (DESYREL) 50 MG tablet Take 50 mg by mouth at bedtime.   trolamine salicylate (ASPERCREME) 10 % cream Apply 1 application topically 3 (three) times daily as needed for muscle pain.   [DISCONTINUED] esomeprazole (NEXIUM) 20 MG capsule TAKE 1 CAPSULE (20 MG TOTAL) BY MOUTH DAILY BEFORE BREAKFAST.   [DISCONTINUED] sucralfate (CARAFATE) 1 GM/10ML suspension Take 1 g by mouth 4 (four) times daily -  with meals and at  bedtime.   No facility-administered encounter medications on file as of 12/24/2020.    Thank you for the opportunity to participate in the care of Ms. Embree.  The palliative care team will continue to follow. Please call our office at [phone] if we can be of additional assistance.   Jason Coop, NP , DNP, MPH, AGPCNP-BC, ACHPN  COVID-19 PATIENT SCREENING TOOL Asked and negative response unless otherwise noted:   Have you had symptoms of covid, tested positive or been in contact with someone with symptoms/positive test in the past 5-10 days?

## 2021-01-12 IMAGING — CR PORTABLE CHEST - 1 VIEW
1 series · 1 of 1 positions shown · non-contrast
Comparison: 11/21/2018

CLINICAL DATA: Pain

EXAM:
PORTABLE CHEST 1 VIEW

[dg chest port 1 view]
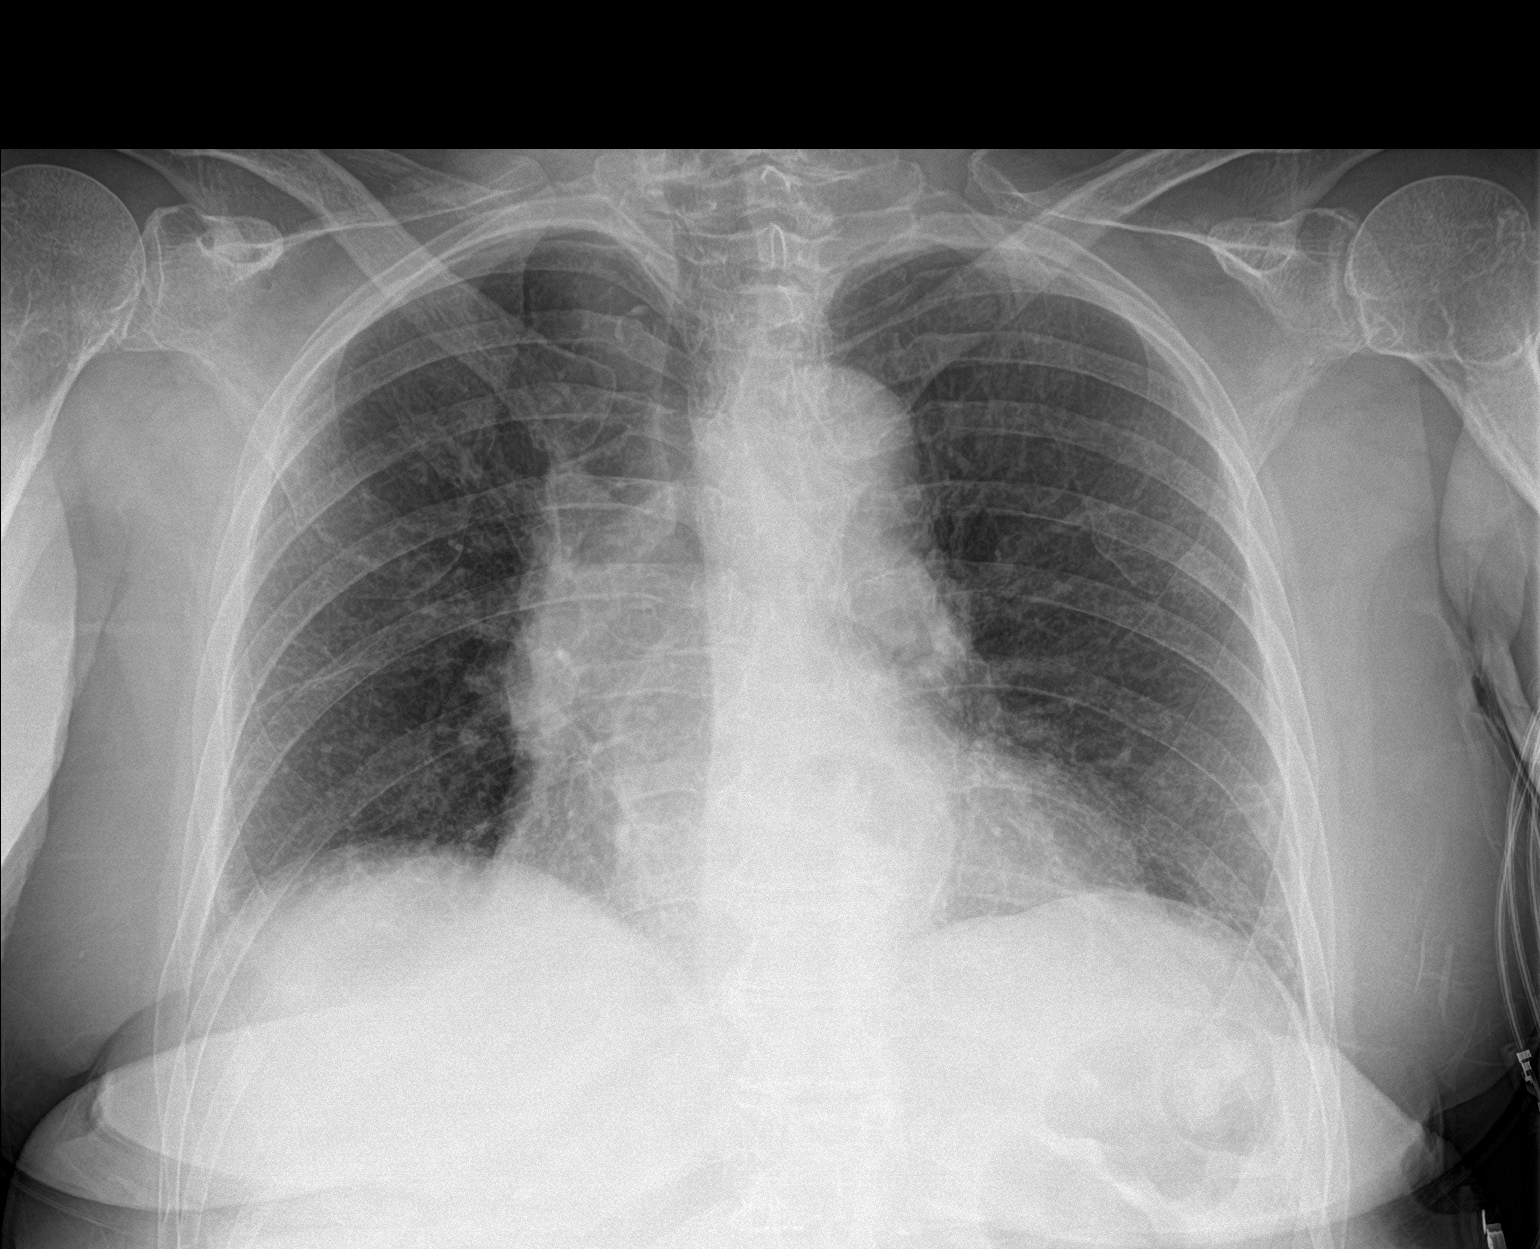

[1 of 1 positions shown; findings below may reference images not displayed]

FINDINGS: The heart size is stable. No pneumothorax. No large pleural
effusion. There is likely some atelectasis versus scarring at the
lung bases. No acute osseous abnormality.
IMPRESSION: No active disease.

## 2021-02-19 ENCOUNTER — Other Ambulatory Visit: Payer: Self-pay

## 2021-02-19 ENCOUNTER — Emergency Department
Admission: EM | Admit: 2021-02-19 | Discharge: 2021-02-19 | Disposition: A | Discharge status: Home / Self Care | Payer: Medicare Other | Attending: Emergency Medicine | Admitting: Emergency Medicine

## 2021-02-19 ENCOUNTER — Emergency Department: Payer: Medicare Other

## 2021-02-19 ENCOUNTER — Encounter: Payer: Self-pay | Admitting: Emergency Medicine

## 2021-02-19 DIAGNOSIS — W19XXXA Unspecified fall, initial encounter: Secondary | ICD-10-CM | POA: Diagnosis not present

## 2021-02-19 DIAGNOSIS — M25551 Pain in right hip: Secondary | ICD-10-CM | POA: Insufficient documentation

## 2021-02-19 DIAGNOSIS — R519 Headache, unspecified: Secondary | ICD-10-CM | POA: Diagnosis not present

## 2021-02-19 DIAGNOSIS — F0390 Unspecified dementia without behavioral disturbance: Secondary | ICD-10-CM | POA: Diagnosis not present

## 2021-02-19 DIAGNOSIS — I10 Essential (primary) hypertension: Secondary | ICD-10-CM | POA: Insufficient documentation

## 2021-02-19 DIAGNOSIS — Z79899 Other long term (current) drug therapy: Secondary | ICD-10-CM | POA: Diagnosis not present

## 2021-02-19 DIAGNOSIS — K5900 Constipation, unspecified: Secondary | ICD-10-CM

## 2021-02-19 MED ORDER — LORAZEPAM 2 MG/ML IJ SOLN
1.0000 mg | Freq: Once | INTRAMUSCULAR | Status: AC
  Administered 2021-02-19: 1 mg via INTRAMUSCULAR
  Filled 2021-02-19: qty 1

## 2021-02-19 MED ORDER — FLEET ENEMA 7-19 GM/118ML RE ENEM: 1.0000 | ENEMA | Freq: Every day | RECTAL | 3 refills | Status: DC | PRN

## 2021-02-19 MED ORDER — POLYETHYLENE GLYCOL 3350 17 G PO PACK: 17.0000 g | PACK | Freq: Every day | ORAL | 0 refills | Status: DC

## 2021-02-19 NOTE — ED Notes (Signed)
Attempted report, no answer at [facility] 

## 2021-02-19 NOTE — ED Notes (Signed)
Attempted report, transferred without answer.

## 2021-02-19 NOTE — ED Notes (Signed)
Called [facility] for transport to [facility]  [phone]

## 2021-02-19 NOTE — Discharge Instructions (Addendum)
CT of the head, cervical spine, pelvis show no acute traumatic injury.  CT of the pelvis shows sequela of previous urinary tract infections.  If there is any concern for urinary tract infection, this can be obtained as an outpatient.  CT scan also showed signs concerning for constipation.  We have prescribed medications to help with this at her nursing home.

## 2021-02-19 NOTE — ED Notes (Signed)
Attempted report, no answer at [facility]

## 2021-02-19 NOTE — ED Provider Notes (Signed)
[facility] Emergency Department Provider Note ____________________________________________   Event Date/Time   First MD Initiated Contact with Patient 02/19/21 [phone]     (approximate)  I have reviewed the triage vital signs and the nursing notes.   HISTORY  Chief Complaint Fall    HPI Jill Hancock is a 80 y.o. female with history of hypertension, CVA, hyperlipidemia, severe dementia who presents to the emergency department with EMS from [facility] nursing facility after she was found on the ground next to her bed.  Spoke with Tammy who reports patient was lying on her right side and was crying holding her right hip and they were concerned because her right leg appeared shorter than the left.  She is not on any blood thinners.  She is wheelchair-bound at baseline.  Patient unable to provide any history.  Tammy reports that this is her baseline.  No family at bedside.   Past Medical History:  Diagnosis Date   Anxiety    Depression    Hyperglycemia    Hypertension    Hypokalemia    Osteoporosis    Stroke (HCC) 02/2012   MRI revealed at least 3 subcentimeter acute infarctions with one area of subacute infarction in widely disparate vascular territories including territory of left cerebellum and around right caudate nucleus along with widespread lacunar infarcts, chronic microvascular ischemic change, and numerous microbleeds suggesting long standing hypertensive cerebrovascular disease.  MRA confirmed intracranial   Unsteady gait    Vertigo     Patient Active Problem List   Diagnosis Date Noted   Pressure injury of back, stage 2 (HCC) 12/24/2020   UTI (urinary tract infection) 01/12/2019   Slurred speech 01/11/2019   Palliative care patient 01/02/2019   CVA (cerebral vascular accident) (HCC) 12/17/2018   Closed left hip fracture (HCC) 11/21/2018   Pre-diabetes 07/25/2018   Multiple rib fractures 12/31/2017   Primary osteoarthritis of right knee  12/14/2017   Dementia, vascular (HCC) 12/04/2017   Right shoulder injury 11/12/2015   Urinary urgency 09/17/2015   Hyperglycemia 07/29/2015   Restless leg 06/07/2015   Benign paroxysmal positional vertigo 06/07/2015   Hyperlipidemia 04/16/2015   Vitamin D deficiency 08/13/2014   T12 compression fracture (HCC) 04/28/2014   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   Major depression, recurrent, chronic (HCC) 11/24/2012   GERD (gastroesophageal reflux disease) 11/24/2012   Osteoporosis 09/05/2012   Right hip pain 08/23/2012   Anxiety 05/04/2012   Hypertension 02/27/2012   History of cerebrovascular accident (CVA) with residual deficit 02/25/2012   Unsteady gait 02/25/2012    Past Surgical History:  Procedure Laterality Date   BREAST BIOPSY Right    pt not sure when    CHOLECYSTECTOMY     INTRAMEDULLARY (IM) NAIL INTERTROCHANTERIC Left 11/21/2018   Procedure: INTRAMEDULLARY (IM) NAIL INTERTROCHANTRIC;  Surgeon: Juanell Fairly, MD;  Location: [facility] ORS;  Service: Orthopedics;  Laterality: Left;   VAGINAL HYSTERECTOMY      Prior to Admission medications   Medication Sig Start Date End Date Taking? Authorizing Provider  polyethylene glycol (MIRALAX) 17 g packet Take 17 g by mouth daily. 02/19/21  Yes Lama Narayanan, Layla Maw, DO  sodium phosphate (FLEET) 7-19 GM/118ML ENEM Place 133 mLs (1 enema total) rectally daily as needed for severe constipation. 02/19/21  Yes Tr[address], Layla Maw, DO  acetaminophen (TYLENOL) 325 MG tablet Take 2 tablets (650 mg total) by mouth every 4 (four) hours as needed for mild pain, moderate pain or fever (or temp > 37.5 C (99.5 F)).  Patient taking differently: Take 650 mg by mouth in the morning, at noon, in the evening, and at bedtime. 01/16/19   Auburn Bilberry, MD  alendronate (FOSAMAX) 70 MG tablet Take 1 tablet (70 mg total) by mouth every Monday. 01/27/19   Karamalegos, Netta Neat, DO  amLODipine (NORVASC) 10 MG tablet TAKE 1 TABLET BY MOUTH EVERY DAY Patient taking  differently: Take 10 mg by mouth at bedtime. 08/26/18   Karamalegos, Netta Neat, DO  atorvastatin (LIPITOR) 40 MG tablet Take 1 tablet (40 mg total) by mouth daily at 6 PM. Patient taking differently: Take 40 mg by mouth at bedtime. 12/20/18   Enid Baas, MD  Cholecalciferol (VITAMIN D3) 50 MCG (2000 UT) TABS Take 2,000 Units by mouth daily.    [provider]  divalproex (DEPAKOTE SPRINKLE) 125 MG capsule 125 mg 2 (two) times daily.  03/13/19   [provider]  docusate (COLACE) 50 MG/5ML liquid Take 100 mg by mouth daily.    [provider]  FLUoxetine (PROZAC) 20 MG capsule 20 mg daily.  03/13/19   [provider]  lisinopril (PRINIVIL,ZESTRIL) 40 MG tablet Take 1 tablet (40 mg total) by mouth daily. Patient taking differently: Take 40 mg by mouth at bedtime. 02/22/18   Karamalegos, Netta Neat, DO  magnesium oxide (MAG-OX) 400 MG tablet Take 200 mg by mouth daily.    [provider]  Melatonin 3 MG CAPS Take by mouth.    [provider]  memantine (NAMENDA) 10 MG tablet Take 10 mg by mouth 2 (two) times daily.  03/13/19   [provider]  Multiple Vitamins-Minerals (CERTAVITE/ANTIOXIDANTS) TABS Take by mouth.    [provider]  pantoprazole (PROTONIX) 20 MG tablet Take 40 mg by mouth daily.    [provider]  senna (SENOKOT) 8.6 MG TABS tablet Take 2 tablets by mouth daily.    [provider]  traMADol (ULTRAM) 50 MG tablet Take 50 mg by mouth 3 (three) times daily as needed for moderate pain.    [provider]  traZODone (DESYREL) 50 MG tablet Take 50 mg by mouth at bedtime. 06/24/20   [provider]  trolamine salicylate (ASPERCREME) 10 % cream Apply 1 application topically 3 (three) times daily as needed for muscle pain.    [provider]  esomeprazole (NEXIUM) 20 MG capsule TAKE 1 CAPSULE (20 MG TOTAL) BY MOUTH DAILY BEFORE BREAKFAST. 12/18/18 12/23/18  Smitty Cords, DO    Allergies Demerol [meperidine], Macrobid [nitrofurantoin macrocrystal], and Penicillins  Family History  Problem Relation Age of Onset   Heart attack Mother    Heart disease Mother    Heart disease Father    Heart attack Brother        death at age 30   Stroke Brother        death at 32   Bladder Cancer Neg Hx    Kidney cancer Neg Hx     Social History Social History   Tobacco Use   Smoking status: Never   Smokeless tobacco: Never  Vaping Use   Vaping Use: Never used  Substance Use Topics   Alcohol use: No    Alcohol/week: 0.0 standard drinks   Drug use: No    Review of Systems Level 5 caveat secondary to dementia   ____________________________________________   PHYSICAL EXAM:  VITAL SIGNS: ED Triage Vitals [02/19/21 0025]  Enc Vitals Group     BP 138/73     Pulse Rate 70  Resp 16     Temp 98.9 F (37.2 C)     Temp Source Oral     SpO2 100 %     Weight      Height      Head Circumference      Peak Flow      Pain Score      Pain Loc      Pain Edu?      Excl. in GC?    CONSTITUTIONAL: Alert, elderly, does not answer questions or follow commands HEAD: Normocephalic; atraumatic EYES: Conjunctivae clear, PERRL, EOMI ENT: normal nose; no rhinorrhea; moist mucous membranes; pharynx without lesions noted; no dental injury; no septal hematoma NECK: Supple, no meningismus, no LAD; no midline spinal tenderness, step-off or deformity; trachea midline CARD: RRR; S1 and S2 appreciated; no murmurs, no clicks, no rubs, no gallops RESP: Normal chest excursion without splinting or tachypnea; breath sounds clear and equal bilaterally; no wheezes, no rhonchi, no rales; no hypoxia or respiratory distress CHEST:  chest wall stable, no crepitus or ecchymosis or deformity, nontender to palpation; no flail chest ABD/GI: Normal bowel sounds; non-distended; soft, non-tender, no rebound, no guarding; no ecchymosis or other lesions noted PELVIS:  stable,  nontender to palpation BACK:  The back appears normal and is non-tender to palpation, there is no CVA tenderness; no midline spinal tenderness, step-off or deformity, pressure ulcer noted to her mid thoracic spine without surrounding redness, warmth, drainage, bleeding EXT: Normal ROM in all joints; non-tender to palpation; no edema; normal capillary refill; no cyanosis, no bony tenderness or bony deformity of patient's extremities, no joint effusion, compartments are soft, extremities are warm and well-perfused, no ecchymosis SKIN: Normal color for age and race; warm NEURO: Moves all extremities equally PSYCH: The patient's mood and manner are appropriate. Grooming and personal hygiene are appropriate.  ____________________________________________   LABS (all labs ordered are listed, but only abnormal results are displayed)  Labs Reviewed - No data to display ____________________________________________  EKG   ____________________________________________  RADIOLOGY I, Anson Peddie, personally viewed and evaluated these images (plain radiographs) as part of my medical decision making, as well as reviewing the written report by the radiologist.  ED MD interpretation: CT scan showed no acute traumatic injury.  Official radiology report(s): CT HEAD WO CONTRAST (5MM)  Result Date: 02/19/2021 CLINICAL DATA:  Head trauma. EXAM: CT HEAD WITHOUT CONTRAST TECHNIQUE: Contiguous axial images were obtained from the base of the skull through the vertex without intravenous contrast. COMPARISON:  Head CT dated 01/11/2019. FINDINGS: Brain: Age-related atrophy and advanced chronic microvascular ischemic changes. There is no acute intracranial hemorrhage. No mass effect or midline shift. No extra-axial fluid collection. Vascular: No hyperdense vessel or unexpected calcification. Skull: Normal. Negative for fracture or focal lesion. Sinuses/Orbits: No acute finding. Other: None IMPRESSION: 1. No acute  intracranial hemorrhage. 2. Age-related atrophy and advanced chronic microvascular ischemic changes. Electronically Signed   By: Elgie CollardArash  Radparvar M.D.   On: 02/19/2021 03:57   CT Cervical Spine Wo Contrast  Result Date: 02/19/2021 CLINICAL DATA:  Neck trauma. EXAM: CT CERVICAL SPINE WITHOUT CONTRAST TECHNIQUE: Multidetector CT imaging of the cervical spine was performed without intravenous contrast. Multiplanar CT image reconstructions were also generated. COMPARISON:  None. FINDINGS: Alignment: No acute subluxation. Skull base and vertebrae: No acute fracture.  Osteopenia. Soft tissues and spinal canal: No prevertebral fluid or swelling. No visible canal hematoma. Disc levels:  Multilevel degenerative changes. Upper chest: Negative. Other: Bilateral carotid bulb calcified plaques. IMPRESSION: 1.  No acute/traumatic cervical spine pathology. 2. Multilevel degenerative changes. Electronically Signed   By: Elgie Collard M.D.   On: 02/19/2021 04:04   CT PELVIS WO CONTRAST  Result Date: 02/19/2021 CLINICAL DATA:  Fall.  Right hip pain. EXAM: CT PELVIS WITHOUT CONTRAST TECHNIQUE: Multidetector CT imaging of the pelvis was performed following the standard protocol without intravenous contrast. COMPARISON:  Plain films of earlier today and CT of 12/06/2019. FINDINGS: Urinary Tract: Faint calcification about the right bladder wall including on 89/3. A tiny right-sided bladder diverticulum. No distal hydroureter. Bowel: Colonic diverticulosis. Large amount of stool within the rectum. normal small bowel. Vascular/Lymphatic: Aortic atherosclerosis. No pelvic sidewall adenopathy. Reproductive:  Hysterectomy.  No adnexal mass. Other:  No significant free fluid.  No free intraperitoneal air. Musculoskeletal: Left proximal femur fixation secondary to remote fracture, without acute hardware complication. Osteopenia. Mild superior endplate compression deformity at L5 is not significantly changed. No ventral canal  encroachment. IMPRESSION: 1. No acute osseous abnormality. 2. Stool within the rectum suggests constipation or even fecal impaction. 3. Small bladder diverticulum, suggesting a component of outlet obstruction. Calcifications along the right bladder wall could be post infectious/inflammatory. Tiny stones cannot be excluded. Electronically Signed   By: Jeronimo Greaves M.D.   On: 02/19/2021 06:45   DG Hip Unilat  With Pelvis 2-3 Views Right  Result Date: 02/19/2021 CLINICAL DATA:  Fall, right leg shortening EXAM: DG HIP (WITH OR WITHOUT PELVIS) 2-3V RIGHT COMPARISON:  None. FINDINGS: Single view radiograph pelvis and two view radiograph of the right hip demonstrates normal alignment. No fracture or dislocation. Joint space appears preserved. Left hip ORIF incidentally noted. Rim calcified rounded densities within the gluteal regions bilaterally likely represent areas of fat necrosis. The soft tissues are otherwise unremarkable. IMPRESSION: No acute fracture or dislocation. Electronically Signed   By: Helyn Numbers M.D.   On: 02/19/2021 01:40    ____________________________________________   PROCEDURES  Procedure(s) performed (including Critical Care):  Procedures    ____________________________________________   INITIAL IMPRESSION / ASSESSMENT AND PLAN / ED COURSE  As part of my medical decision making, I reviewed the following data within the electronic MEDICAL RECORD NUMBER History obtained from family, Nursing notes reviewed and incorporated, Old chart reviewed, Radiograph reviewed , CTs reviewed, and Notes from prior ED visits         Patient here with unwitnessed fall from her nursing facility.  Seems to be at her neurologic baseline per nursing home staff.  Patient states that she is not having any pain when questioned.  She does have a leg length discrepancy on exam but is moving her hips without difficulty.  X-ray obtained shows no acute traumatic injury.  CT of the head and cervical spine  unremarkable.  Given it is unclear if this is her baseline with her leg length discrepancy, will obtain CT of the pelvis.  ED PROGRESS  CT pelvis shows no acute traumatic injury.  She does have findings consistent with likely chronic urinary tract infections which daughter confirms.  She is not having any signs or symptoms as today's to suggest UTI and daughter reports they have been told by her urologist that she has been "overtreated" in the past.  We have agreed to hold off on cathing patient to obtain urinalysis, urine culture currently.  CT also concerning for possible fecal impaction, constipation.  On my exam, patient has only minimal amount of stool at my fingertip that is soft, brown in nature.  There is no significant impaction  on exam.  Will discharge with instructions to continue Colace as prescribed and start MiraLAX, enemas as needed.  I feel she is safe to be discharged back to her nursing facility and daughter is also comfortable with this plan.  At this time, I do not feel there is any life-threatening condition present. I have reviewed, interpreted and discussed all results (EKG, imaging, lab, urine as appropriate) and exam findings with patient/family. I have reviewed nursing notes and appropriate previous records.  I feel the patient is safe to be discharged home without further emergent workup and can continue workup as an outpatient as needed. Discussed usual and customary return precautions. Patient/family verbalize understanding and are comfortable with this plan.  Outpatient follow-up has been provided as needed. All questions have been answered.  ____________________________________________   FINAL CLINICAL IMPRESSION(S) / ED DIAGNOSES  Final diagnoses:  Fall, initial encounter  Constipation, unspecified constipation type     ED Discharge Orders          Ordered    polyethylene glycol (MIRALAX) 17 g packet  Daily        02/19/21 0709    sodium phosphate (FLEET) 7-19  GM/118ML ENEM  Daily PRN        02/19/21 4010            *Please note:  FREDERICK KLINGER was evaluated in Emergency Department on 02/19/2021 for the symptoms described in the history of present illness. She was evaluated in the context of the global COVID-19 pandemic, which necessitated consideration that the patient might be at risk for infection with the SARS-CoV-2 virus that causes COVID-19. Institutional protocols and algorithms that pertain to the evaluation of patients at risk for COVID-19 are in a state of rapid change based on information released by regulatory bodies including the CDC and federal and state organizations. These policies and algorithms were followed during the patient's care in the ED.  Some ED evaluations and interventions may be delayed as a result of limited staffing during and the pandemic.*   Note:  This document was prepared using Dragon voice recognition software and may include unintentional dictation errors.    Loden Laurent, Layla Maw, DO 02/19/21 [phone]

## 2021-02-19 NOTE — ED Notes (Signed)
Provided update to pt's daughter, [name]

## 2021-02-19 NOTE — ED Triage Notes (Signed)
Pt presents to ER via [employer], from [facility]. Unwitnessed fall, history of dementia per staff concern right leg shorter that left. Pt responds but does not talk. Per EMS pt declined any pain. Pt calm, in triage no distress noted. Pt has a history of right femur fracture.

## 2021-04-27 ENCOUNTER — Non-Acute Institutional Stay: Payer: Medicare Other | Admitting: Primary Care

## 2021-04-27 ENCOUNTER — Other Ambulatory Visit: Payer: Self-pay

## 2021-04-27 DIAGNOSIS — I6930 Unspecified sequelae of cerebral infarction: Secondary | ICD-10-CM

## 2021-04-27 DIAGNOSIS — F339 Major depressive disorder, recurrent, unspecified: Secondary | ICD-10-CM

## 2021-04-27 DIAGNOSIS — Z515 Encounter for palliative care: Secondary | ICD-10-CM

## 2021-04-27 DIAGNOSIS — F0150 Vascular dementia without behavioral disturbance: Secondary | ICD-10-CM

## 2021-04-27 NOTE — Progress Notes (Signed)
[facility] Palliative Care Consult Note Telephone: [phone]  Fax: [phone]    Date of encounter: 04/27/21 1:22 PM PATIENT NAME: Jill Hancock [name] [address]   [phone] (home)  DOB: August 24, 1940 MRN: [id number] PRIMARY CARE PROVIDER:    Brayton Mars, MD,  [address] [place] [address] [phone]  REFERRING PROVIDER:   Brayton Mars, MD,  [address] [place] [address] [phone]  RESPONSIBLE PARTY:    Contact Information     Name Relation Home Work Mobile   [place] Daughter   [phone]   Murphy,[name] Daughter   [phone]        I met face to face with patient in [facility] facility. Palliative Care was asked to follow this patient by consultation request of  Brayton Mars, MD to address advance care planning and complex medical decision making. This is a follow up visit.                                   ASSESSMENT AND PLAN / RECOMMENDATIONS:   Advance Care Planning/Goals of Care: Goals include to maximize quality of life and symptom management.  CODE STATUS: DNR on chart Call to [facility], no answer, message left with call back number.  Symptom Management/Plan:  Staff state patient is doing well, losing adl ability but mood is usually stable. She is able to self feed finger foods and is gaining weight. She mobilizes in w/c in halls. She is able to interact verbally with staff that she knows well. Staff do not have any care concerns and states she is at her baseline. 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 Today she appears agitated some but is propelling herself and not vocalizing any discomforts.   Follow up Palliative Care Visit: Palliative care will continue to follow for complex medical decision making, advance care planning, and clarification of goals. Return 4-6 months or prn.  I spent 25 minutes providing this consultation. More than 50% of the  time in this consultation was spent in counseling and care coordination.  PPS: 40%  HOSPICE ELIGIBILITY/DIAGNOSIS: no  Chief Complaint: dementia  HISTORY OF PRESENT ILLNESS:  Jill Hancock is a 80 y.o. year old female  with dementia, immobility, kyphosis, pressure injury of skin .   History obtained from review of EMR, discussion with primary team, and interview with family, facility staff/caregiver and/or Ms. Yaffe.  I reviewed available labs, medications, imaging, studies and related documents from the EMR.  Records reviewed and summarized above.   ROS  General: NAD ENMT: denies dysphagia Pulmonary: denies cough, denies increased SOB Abdomen: endorses good appetite, denies constipation, endorses incontinence of bowel GU: denies dysuria, endorses incontinence of urine MSK:   endorses weakness,  no falls reported Skin:  T Spine  area  Pressure injury, arm  skin tear Neurological: denies pain, denies insomnia Psych: Endorses  flat mood Heme/lymph/immuno: denies bruises, abnormal bleeding  Physical Exam: Current and past weights:121 bs, wt gain Constitutional: NAD General: frail appearing, thin EYES: anicteric sclera, lids intact, no discharge  ENMT: intact hearing, oral mucous membranes moist CV: no LE edema Pulmonary: no increased work of breathing, no cough, room air Abdomen: intake 100%,  finger foods, no ascites GU: deferred MSK: + sarcopenia, moves all extremities,  non ambulatory Skin: warm and dry, skin tear on arm Neuro:  + generalized weakness,  +  cognitive impairment Psych: anxious affect, A and O x 1 Hem/lymph/immuno: no widespread bruising  Thank you for the opportunity to participate in the care of Jill Hancock.  The palliative care team will continue to follow. Please call our office at [phone] if we can be of additional assistance.   Jason Coop, NP DNP, AGPCNP-BC  COVID-19 PATIENT SCREENING TOOL Asked and negative response unless  otherwise noted:   Have you had symptoms of covid, tested positive or been in contact with someone with symptoms/positive test in the past 5-10 days?

## 2021-07-15 ENCOUNTER — Other Ambulatory Visit: Payer: Medicare Other | Admitting: Primary Care

## 2021-07-15 ENCOUNTER — Other Ambulatory Visit: Payer: Self-pay

## 2021-07-15 VITALS — Ht 62.0 in | Wt 112.0 lb

## 2021-07-15 DIAGNOSIS — Z515 Encounter for palliative care: Secondary | ICD-10-CM

## 2021-07-15 DIAGNOSIS — F339 Major depressive disorder, recurrent, unspecified: Secondary | ICD-10-CM

## 2021-07-15 DIAGNOSIS — Z7409 Other reduced mobility: Secondary | ICD-10-CM

## 2021-07-15 DIAGNOSIS — R5381 Other malaise: Secondary | ICD-10-CM

## 2021-07-15 DIAGNOSIS — F0150 Vascular dementia without behavioral disturbance: Secondary | ICD-10-CM

## 2021-07-15 NOTE — Progress Notes (Addendum)
[facility] Palliative Care Consult Note Telephone: [phone]  Fax: [phone]    Date of encounter: 07/15/21 10:33 AM PATIENT NAME: Jill Hancock [address] [place] [address]   [phone] (home)  DOB: [birth year] MRN: [id number] PRIMARY CARE PROVIDER:    Brayton Mars, MD,  [address] [place] [address] [phone]  REFERRING PROVIDER:   Brayton Mars, [place],   [address] [phone]  RESPONSIBLE PARTY:    Contact Information     Name Relation Home Work Mobile   [place] Daughter   [phone]   Jill Hancock,Jill Hancock Daughter   [phone]       I met face to face with patient in [facility] facility. Palliative Care was asked to follow this patient by consultation request of  Brayton Mars, MD to address advance care planning and complex medical decision making. This is a follow up visit.                                   ASSESSMENT AND PLAN / RECOMMENDATIONS:   Advance Care Planning/Goals of Care: Goals include to maximize quality of life and symptom management. Patient/health care surrogate gave his/her permission to discuss. CODE STATUS: DNR, MOST on file Spoke with POA Jill Hancock about patient's status.  I reviewed a MOST form today. The patient and family outlined their wishes for the following treatment decisions:  Cardiopulmonary Resuscitation: Do Not Attempt Resuscitation (DNR/No CPR)  Medical Interventions: Limited Additional Interventions: Use medical treatment, IV fluids and cardiac monitoring as indicated, DO NOT USE intubation or mechanical ventilation. May consider use of less invasive airway support such as BiPAP or CPAP. Also provide comfort measures. Transfer to the hospital if indicated. Avoid intensive care.   Antibiotics: Antibiotics if indicated  IV Fluids: IV fluids if indicated  Feeding Tube: Feeding tube for a defined trial period   Symptom Management/Plan:  Mobility:  Patient oob in w/c in halls.  Able to locomote with w/c, not walking  Wound: T/c to daughter who is concerned about leg wound. I would recommend dressing such as duoderm to cover but patient may pull  off. It may also need sarna or mupirocin to help heal.    Nutrition: Weights are stable at this time. Intake is adequate. No recent albumin found.  Follow up Palliative Care Visit: Palliative care will continue to follow for complex medical decision making, advance care planning, and clarification of goals. Return 8 weeks or prn.  This visit was coded based on medical decision making (MDM).  PPS: 40%  HOSPICE ELIGIBILITY/DIAGNOSIS: no  Chief Complaint: debility, dementia  HISTORY OF PRESENT ILLNESS:  Jill Hancock is a 81 y.o. year old female  with dementia, debility, immobility.   History obtained from review of EMR, discussion with primary team, and interview with family, facility staff/caregiver and/or Jill Hancock.  I reviewed available labs, medications, imaging, studies and related documents from the EMR.  Records reviewed and summarized above.   ROS Jill Hancock  General: NAD ENMT: denies dysphagia Pulmonary: denies cough, denies increased SOB Abdomen: endorses good appetite, denies constipation, endorses incontinence of bowel GU: denies dysuria, endorses incontinence of urine MSK:  denies  increased weakness, no falls reported Skin: wound on R LE due to scratching Neurological: denies pain, denies insomnia Psych: Endorses positive mood Heme/lymph/immuno: denies bruises, abnormal bleeding  Physical Exam: Current and past weights 112 lbs, stable  Body mass index  is 20.49 kg/m. Constitutional: NAD General: frail appearing, thin EYES: anicteric sclera, lids intact, no discharge  ENMT: intact hearing, oral mucous membranes moist, CV: S1S2, RRR, no LE edema Pulmonary: no increased work of breathing, no cough, room air Abdomen: intake 75%, no ascites GU: deferred MSK: +  sarcopenia, moves all extremities,  non ambulatory, mobilizes in w/c Skin: warm and dry, 1.5 cm round lesion on medial LE, recommend duoderm Neuro:  + generalized weakness,  + cognitive impairment Psych: anxious affect, A and O x 1 Hem/lymph/immuno: no widespread bruising  Outpatient Encounter Medications as of 07/15/2021  Medication Sig   acetaminophen (TYLENOL) 325 MG tablet Take 2 tablets (650 mg total) by mouth every 4 (four) hours as needed for mild pain, moderate pain or fever (or temp > 37.5 C (99.5 F)). (Patient taking differently: Take 650 mg by mouth in the morning, at noon, in the evening, and at bedtime.)   alendronate (FOSAMAX) 70 MG tablet Take 1 tablet (70 mg total) by mouth every Monday.   amLODipine (NORVASC) 10 MG tablet TAKE 1 TABLET BY MOUTH EVERY DAY (Patient taking differently: Take 10 mg by mouth at bedtime.)   atorvastatin (LIPITOR) 40 MG tablet Take 1 tablet (40 mg total) by mouth daily at 6 PM. (Patient taking differently: Take 40 mg by mouth at bedtime.)   Cholecalciferol (VITAMIN D3) 50 MCG (2000 UT) TABS Take 2,000 Units by mouth daily.   divalproex (DEPAKOTE SPRINKLE) 125 MG capsule 125 mg 2 (two) times daily.    lisinopril (PRINIVIL,ZESTRIL) 40 MG tablet Take 1 tablet (40 mg total) by mouth daily. (Patient taking differently: Take 40 mg by mouth at bedtime.)   magnesium oxide (MAG-OX) 400 MG tablet Take 200 mg by mouth daily.   Melatonin 3 MG CAPS Take by mouth.   memantine (NAMENDA) 10 MG tablet Take 10 mg by mouth 2 (two) times daily.    Multiple Vitamins-Minerals (CERTAVITE/ANTIOXIDANTS) TABS Take by mouth.   pantoprazole (PROTONIX) 20 MG tablet Take 40 mg by mouth daily.   polyethylene glycol (MIRALAX) 17 g packet Take 17 g by mouth daily.   senna (SENOKOT) 8.6 MG TABS tablet Take 2 tablets by mouth daily.   sodium phosphate (FLEET) 7-19 GM/118ML ENEM Place 133 mLs (1 enema total) rectally daily as needed for severe constipation.   traMADol (ULTRAM) 50 MG  tablet Take 50 mg by mouth 3 (three) times daily as needed for moderate pain.   traZODone (DESYREL) 50 MG tablet Take 50 mg by mouth at bedtime.   docusate (COLACE) 50 MG/5ML liquid Take 100 mg by mouth daily. (Patient not taking: Reported on 07/15/2021)   FLUoxetine (PROZAC) 20 MG capsule 20 mg daily.    trolamine salicylate (ASPERCREME) 10 % cream Apply 1 application topically 3 (three) times daily as needed for muscle pain. (Patient not taking: Reported on 07/15/2021)   [DISCONTINUED] esomeprazole (NEXIUM) 20 MG capsule TAKE 1 CAPSULE (20 MG TOTAL) BY MOUTH DAILY BEFORE BREAKFAST.   No facility-administered encounter medications on file as of 07/15/2021.    Thank you for the opportunity to participate in the care of Ms. Murton.  The palliative care team will continue to follow. Please call our office at [phone] if we can be of additional assistance.   Jason Coop, NP DNP, AGPCNP-BC  COVID-19 PATIENT SCREENING TOOL Asked and negative response unless otherwise noted:   Have you had symptoms of covid, tested positive or been in contact with someone with symptoms/positive test in the past 5-10  days?

## 2021-09-09 ENCOUNTER — Non-Acute Institutional Stay: Payer: Medicare Other | Admitting: Primary Care

## 2021-09-09 ENCOUNTER — Other Ambulatory Visit: Payer: Self-pay

## 2021-09-09 DIAGNOSIS — Z7409 Other reduced mobility: Secondary | ICD-10-CM

## 2021-09-09 DIAGNOSIS — F0150 Vascular dementia without behavioral disturbance: Secondary | ICD-10-CM

## 2021-09-09 DIAGNOSIS — Z515 Encounter for palliative care: Secondary | ICD-10-CM

## 2021-09-09 DIAGNOSIS — F339 Major depressive disorder, recurrent, unspecified: Secondary | ICD-10-CM

## 2021-09-09 DIAGNOSIS — R5381 Other malaise: Secondary | ICD-10-CM

## 2021-09-09 NOTE — Progress Notes (Signed)
? ? ?[facility] ?Community Palliative Care Consult Note ?Telephone: [phone]  ?Fax: [phone]  ? ? ?Date of encounter: 09/09/21 ?1:43 PM ?PATIENT NAME: Jill H[place] ?[place] [address]   ?[phone] (home)  ?DOB: Jul 11, 1940 ?MRN: [id number] ?PRIMARY CARE PROVIDER:    ?Jill Mars, MD,  ?Jill Hancock ?[place] [address] ?[phone] ? ?REFERRING PROVIDER:   ?Jill Mars, MD ?[place] ?[place],  [place] [address] ?[phone] ? ?RESPONSIBLE PARTY:    ?Contact Information   ? ? Name Relation Home Work Mobile  ? Jill Hancock Daughter   [phone]  ? Jill Hancock Daughter   [phone]  ? ?  ? ? ? ?I met face to face with patient in [facility] facility. Palliative Care was asked to follow this patient by consultation request of  Jill Mars, MD to address advance care planning and complex medical decision making. This is a follow up visit. ? ?                                 ASSESSMENT AND PLAN / RECOMMENDATIONS:  ? ?Advance Care Planning/Goals of Care: Goals include to maximize quality of life and symptom management. Patient/health care surrogate gave his/her permission to discuss. ?CODE STATUS: DNR ? ?Symptom Management/Plan: ? ?New orders from PT to put bed to wall to decrease fall risk. She has decreased stimulation however looking at wall and I recommend opening blinds daily for her to watch outside. ? ?Behavior disturbances: Have decreased. Fast stage 7 B, Can smile but is now mostly bedbound. Staff endorses low depakote level but ok'ed due to use for behavior disturbances vs seizures.  ? ?Nutrition: Eating seems to be declining, 25-50% of lunch eaten. Please help with meal set up including opening cartons. Weight is now 116 lbs. 6 months ago = 121 lbs, 4% loss. Weight 2.5 years ago was 143 lbs, 19% loss. ? ?Medications reviewed. ? ?Follow up Palliative Care Visit: Palliative care will continue to follow for complex medical decision making, advance care  planning, and clarification of goals. Return 6 weeks or prn. ? ?This visit was coded based on medical decision making (MDM). ? ?PPS: 30% ? ?HOSPICE ELIGIBILITY/DIAGNOSIS: TBD ? ?Chief Complaint: debility, immobility, depression ? ?HISTORY OF PRESENT ILLNESS:  Jill Hancock is a 81 y.o. year old female  with immobility, depression, dementia. Has gotten less mobile, spending more tin in bed vs w/c. Has new PT orders to decrease risk of falling out of bed. Patient seen today to review palliative care needs to include medical decision making and advance care planning as appropriate.  ? ?History obtained from review of EMR, discussion with primary team, and interview with family, facility staff/caregiver and/or Jill Hancock.  ?I reviewed available labs, medications, imaging, studies and related documents from the EMR.  Records reviewed and summarized above.  ? ?ROS ? ?General: NAD ?EYES: denies vision changes ?ENMT: denies dysphagia ?Cardiovascular: denies chest pain, denies DOE ?Pulmonary: denies cough, denies increased SOB ?Abdomen: endorses good appetite, denies constipation, endorses continence of bowel ?GU: denies dysuria, endorses continence of urine ?MSK:  denies  increased weakness,  no falls reported ?Skin: denies rashes or wounds ?Neurological: denies pain, denies insomnia ?Psych: Endorses positive mood ? ?Physical Exam: ?Current and past weights: ?Constitutional: NAD ?General: frail appearing, thin/WNWD/obese  ?EYES: anicteric sclera, lids intact, no discharge  ?ENMT: intact hearing, oral mucous membranes moist, dentition intact ?CV: S1S2, RRR, no LE edema ?Pulmonary:  LCTA, no increased work of breathing, no cough, room air ?Abdomen: intake 100%, normo-active BS + 4 quadrants, soft and non tender, no ascites ?MSK: no sarcopenia, moves all extremities, ambulatory ?Skin: warm and dry, no rashes or wounds on visible skin ?Neuro:  no generalized weakness,  no cognitive impairment, non-anxious affect ? ? ?Thank  you for the opportunity to participate in the care of Jill Hancock.  The palliative care team will continue to follow. Please call our office at [phone] if we can be of additional assistance.  ? ?Jill Coop, NP DNP, AGPCNP-BC ? ?COVID-19 PATIENT SCREENING TOOL ?Asked and negative response unless otherwise noted:  ? ?Have you had symptoms of covid, tested positive or been in contact with someone with symptoms/positive test in the past 5-10 days?  ? ?

## 2021-12-01 ENCOUNTER — Non-Acute Institutional Stay: Payer: Medicare Other | Admitting: Primary Care

## 2021-12-01 DIAGNOSIS — F01C11 Vascular dementia, severe, with agitation: Secondary | ICD-10-CM

## 2021-12-01 DIAGNOSIS — R5381 Other malaise: Secondary | ICD-10-CM

## 2021-12-01 DIAGNOSIS — Z515 Encounter for palliative care: Secondary | ICD-10-CM

## 2021-12-01 DIAGNOSIS — Z7409 Other reduced mobility: Secondary | ICD-10-CM

## 2021-12-01 NOTE — Progress Notes (Signed)
[facility] Palliative Care Consult Note Telephone: [phone]  Fax: [phone]    Date of encounter: 12/01/21 3:10 PM PATIENT NAME: Jill Hancock [address] [place] [address]   [phone] (home)  DOB: 08-16-1940 MRN: [id number] PRIMARY CARE PROVIDER:    Hal Morales, DO,  [place] [address] [phone]  REFERRING PROVIDER:   Hal Morales, DO [place],  [place] [address] [phone]  RESPONSIBLE PARTY:    Contact Information     Name Relation Home Work Mobile   [place] Daughter   [phone]   Murphy,[name] Daughter   [phone]       I met face to face with patient in [facility] facility. Palliative Care was asked to follow this patient by consultation request of  Simpson-Tarokh, Leann, * to address advance care planning and complex medical decision making. This is a follow up visit.                                   ASSESSMENT AND PLAN / RECOMMENDATIONS:   Advance Care Planning/Goals of Care: Goals include to maximize quality of life and symptom management. Patient/health care surrogate gave his/her permission to discuss.Our advance care planning conversation included a discussion about:    The value and importance of advance care planning  Experiences with loved ones who have been seriously ill or have died  Exploration of personal, cultural or spiritual beliefs that might influence medical decisions  Exploration of goals of care in the event of a sudden injury or illness  Identification of a healthcare agent - Daughter Cecille Rubin Discussed patient's status and goals with POA by phone.  CODE STATUS: DNR  Symptom Management/Plan:  Aggressiveness: Family reports she has been somewhat aggressive but they are treating for a UTI. Staff states she has improved in her agitation.   Intake: Eating well, gaining weight from January.  UTI: Patient was having some behaviors, being Rx for  UTI with rocephin per staff report. Reported labs sent.  Mobility: able to propel in w/c. Staff reports patient mobilizes into other patients' rooms and sometimes disturbs their belongings.   Follow up Palliative Care Visit: Palliative care will continue to follow for complex medical decision making, advance care planning, and clarification of goals. Return 8 weeks or prn.  I spent 25 minutes providing this consultation. More than 50% of the time in this consultation was spent in counseling and care coordination.  PPS: 40%  HOSPICE ELIGIBILITY/DIAGNOSIS: no  Chief Complaint: Behavior disturbances., UTI f/u  HISTORY OF PRESENT ILLNESS:  Jill Hancock is a 81 y.o. year old female  with dementia, immobility, agitation, UTI. Patient seen today to review palliative care needs to include medical decision making and advance care planning as appropriate.   History obtained from review of EMR, discussion with primary team, and interview with family, facility staff/caregiver and/or Ms. Mozley.  I reviewed available labs, medications, imaging, studies and related documents from the EMR.  Records reviewed and summarized above.   ROS/staff  General: NAD ENMT: denies dysphagia Pulmonary: denies cough, denies increased SOB Abdomen: endorses good appetite, denies constipation, endorses incontinence of bowel GU: denies dysuria, endorses incontinence of urine MSK:  denies  increased weakness, no falls reported Skin: denies rashes or wounds Neurological: denies pain, denies insomnia Psych: Endorses  aggressiveness on occasion per staff  Physical Exam: Current and past weights: 116 lbs, 112  lbs in 1/23, 4 lb gain Constitutional: NAD, smiling General: frail appearing, thin EYES: anicteric sclera, lids intact, no discharge  ENMT: intact hearing, oral mucous membranes moist CV: S1S2, RRR, no LE edema Pulmonary: LCTA, no increased work of breathing, no cough, room air Abdomen: intake 80%,  normo-active BS + 4 quadrants,  no ascites MSK: + sarcopenia, moves all extremities,  non ambulatory Skin: warm and dry, no rashes or wounds on visible skin Neuro:  + generalized weakness,  + cognitive impairment, non-anxious affect  Thank you for the opportunity to participate in the care of Jill Hancock.  The palliative care team will continue to follow. Please call our office at [phone] if we can be of additional assistance.   Jason Coop, NP DNP, AGPCNP-BC  COVID-19 PATIENT SCREENING TOOL Asked and negative response unless otherwise noted:   Have you had symptoms of covid, tested positive or been in contact with someone with symptoms/positive test in the past 5-10 days?

## 2022-01-09 ENCOUNTER — Emergency Department: Payer: Medicare Other

## 2022-01-09 ENCOUNTER — Emergency Department
Admission: EM | Admit: 2022-01-09 | Discharge: 2022-01-10 | Disposition: A | Discharge status: Home / Self Care | Payer: Medicare Other | Source: Home / Self Care | Attending: Emergency Medicine | Admitting: Emergency Medicine

## 2022-01-09 ENCOUNTER — Other Ambulatory Visit: Payer: Self-pay

## 2022-01-09 DIAGNOSIS — S065X0A Traumatic subdural hemorrhage without loss of consciousness, initial encounter: Secondary | ICD-10-CM | POA: Insufficient documentation

## 2022-01-09 DIAGNOSIS — W19XXXA Unspecified fall, initial encounter: Secondary | ICD-10-CM

## 2022-01-09 DIAGNOSIS — F0390 Unspecified dementia without behavioral disturbance: Secondary | ICD-10-CM | POA: Insufficient documentation

## 2022-01-09 DIAGNOSIS — S51012A Laceration without foreign body of left elbow, initial encounter: Secondary | ICD-10-CM | POA: Insufficient documentation

## 2022-01-09 DIAGNOSIS — Z23 Encounter for immunization: Secondary | ICD-10-CM | POA: Insufficient documentation

## 2022-01-09 DIAGNOSIS — R944 Abnormal results of kidney function studies: Secondary | ICD-10-CM | POA: Insufficient documentation

## 2022-01-09 DIAGNOSIS — S8001XA Contusion of right knee, initial encounter: Secondary | ICD-10-CM | POA: Insufficient documentation

## 2022-01-09 DIAGNOSIS — S065XAA Traumatic subdural hemorrhage with loss of consciousness status unknown, initial encounter: Secondary | ICD-10-CM | POA: Diagnosis not present

## 2022-01-09 DIAGNOSIS — I10 Essential (primary) hypertension: Secondary | ICD-10-CM | POA: Insufficient documentation

## 2022-01-09 DIAGNOSIS — W010XXA Fall on same level from slipping, tripping and stumbling without subsequent striking against object, initial encounter: Secondary | ICD-10-CM | POA: Insufficient documentation

## 2022-01-09 LAB — CBC WITH DIFFERENTIAL/PLATELET
Abs Immature Granulocytes: 0.06 10*3/uL (ref 0.00–0.07)
Basophils Absolute: 0.1 10*3/uL (ref 0.0–0.1)
Basophils Relative: 1 %
Eosinophils Absolute: 0 10*3/uL (ref 0.0–0.5)
Eosinophils Relative: 0 %
HCT: 43 % (ref 36.0–46.0)
Hemoglobin: 14.3 g/dL (ref 12.0–15.0)
Immature Granulocytes: 1 %
Lymphocytes Relative: 16 %
Lymphs Abs: 1.6 10*3/uL (ref 0.7–4.0)
MCH: 30.2 pg (ref 26.0–34.0)
MCHC: 33.3 g/dL (ref 30.0–36.0)
MCV: 90.9 fL (ref 80.0–100.0)
Monocytes Absolute: 0.8 10*3/uL (ref 0.1–1.0)
Monocytes Relative: 7 %
Neutro Abs: 7.7 10*3/uL (ref 1.7–7.7)
Neutrophils Relative %: 75 %
Platelets: 307 10*3/uL (ref 150–400)
RBC: 4.73 MIL/uL (ref 3.87–5.11)
RDW: 12.7 % (ref 11.5–15.5)
WBC: 10.2 10*3/uL (ref 4.0–10.5)
nRBC: 0 % (ref 0.0–0.2)

## 2022-01-09 LAB — COMPREHENSIVE METABOLIC PANEL
ALT: 25 U/L (ref 0–44)
AST: 37 U/L (ref 15–41)
Albumin: 3.9 g/dL (ref 3.5–5.0)
Alkaline Phosphatase: 51 U/L (ref 38–126)
Anion gap: 13 (ref 5–15)
BUN: 26 mg/dL — ABNORMAL HIGH (ref 8–23)
CO2: 27 mmol/L (ref 22–32)
Calcium: 9.3 mg/dL (ref 8.9–10.3)
Chloride: 99 mmol/L (ref 98–111)
Creatinine, Ser: 1.37 mg/dL — ABNORMAL HIGH (ref 0.44–1.00)
GFR, Estimated: 39 mL/min — ABNORMAL LOW (ref >60)
Glucose, Bld: 123 mg/dL — ABNORMAL HIGH (ref 70–99)
Potassium: 3.7 mmol/L (ref 3.5–5.1)
Sodium: 139 mmol/L (ref 135–145)
Total Bilirubin: 0.8 mg/dL (ref 0.3–1.2)
Total Protein: 7.4 g/dL (ref 6.5–8.1)

## 2022-01-09 LAB — URINALYSIS, ROUTINE W REFLEX MICROSCOPIC
Bilirubin Urine: NEGATIVE
Glucose, UA: NEGATIVE mg/dL
Ketones, ur: 20 mg/dL — AB
Leukocytes,Ua: NEGATIVE
Nitrite: NEGATIVE
Protein, ur: 30 mg/dL — AB
Specific Gravity, Urine: 1.019 (ref 1.005–1.030)
pH: 6 (ref 5.0–8.0)

## 2022-01-09 LAB — CK: Total CK: 397 U/L — ABNORMAL HIGH (ref 38–234)

## 2022-01-09 MED ORDER — TETANUS-DIPHTH-ACELL PERTUSSIS 5-2.5-18.5 LF-MCG/0.5 IM SUSY
0.5000 mL | PREFILLED_SYRINGE | Freq: Once | INTRAMUSCULAR | Status: AC
  Administered 2022-01-09: 0.5 mL via INTRAMUSCULAR
  Filled 2022-01-09: qty 0.5

## 2022-01-09 MED ORDER — SODIUM CHLORIDE 0.9 % IV BOLUS
500.0000 mL | Freq: Once | INTRAVENOUS | Status: AC
  Administered 2022-01-09: 500 mL via INTRAVENOUS

## 2022-01-09 MED ORDER — ACETAMINOPHEN 500 MG PO TABS
1000.0000 mg | ORAL_TABLET | Freq: Once | ORAL | Status: AC
  Administered 2022-01-09: 1000 mg via ORAL
  Filled 2022-01-09: qty 2

## 2022-01-09 NOTE — ED Provider Notes (Signed)
[facility] Provider Note    Event Date/Time   First MD Initiated Contact with Patient 01/09/22 1744     (approximate)   History   Fall   HPI  Jill Hancock is a 81 y.o. female with history of hypertension, CVA, hyperlipidemia, severe dementia who comes in with a [facility] for a [facility].  Patient reportedly had a unwitnessed fall around 4 PM where she was found to have a hematoma on her forehead.  She is wheelchair-bound at baseline and is nonverbal at baseline.  There was a skin tear on her left elbow.   I reviewed patient's palliative care note from 12/01/2021 where she was seen and treated for UTI due to some aggression.   Physical Exam   Triage Vital Signs: Blood pressure 128/77, pulse 80, temperature 97.8 F (36.6 C), temperature source Oral, resp. rate 18, SpO2 100 %.   Most recent vital signs: Vitals:   01/09/22 1755  BP: 128/77  Pulse: 80  Resp: 18  Temp: 97.8 F (36.6 C)  SpO2: 100%     General: Awake, no distress.  CV:  Good peripheral perfusion.  Resp:  Normal effort.  Abd:  No distention. Non tender Other:  Hematoma above her right eye.  Pupil reactive extraocular movements appear intact.  She is got some skin tear noted on her left elbow and some bruising on her right knee.  Difficult to tell if she is having any pain given nonverbal but I am able to range her joints and she is able to move without any obvious tenderness.  She does however have a stiff her left arm than right arm.   ED Results / Procedures / Treatments   Labs (all labs ordered are listed, but only abnormal results are displayed) Labs Reviewed  COMPREHENSIVE METABOLIC PANEL - Abnormal; Notable for the following components:      Result Value   Glucose, Bld 123 (*)    BUN 26 (*)    Creatinine, Ser 1.37 (*)    GFR, Estimated 39 (*)    All other components within normal limits  CBC WITH DIFFERENTIAL/PLATELET     EKG  My interpretation of  EKG:  Normal sinus rate of 81 without any ST elevation or T wave inversions, normal intervals  RADIOLOGY I have reviewed the CT head personally interpreted and there is a small subdural hematoma on the right 3 mm  PROCEDURES:  Critical Care performed: No  .1-3 Lead EKG Interpretation  Performed by: Concha Se, MD Authorized by: Concha Se, MD     Interpretation: normal     ECG rate:  80   ECG rate assessment: normal     Rhythm: sinus rhythm     Ectopy: none     Conduction: normal      MEDICATIONS ORDERED IN ED: Medications  Tdap (BOOSTRIX) injection 0.5 mL (has no administration in time range)     IMPRESSION / MDM / ASSESSMENT AND PLAN / ED COURSE  I reviewed the triage vital signs and the nursing notes.   Patient's presentation is most consistent with acute presentation with potential threat to life or bodily function.   Differential includes intracranial hemorrhage, cervical fracture, fracture of extremity.  Difficult to appreciate where patient is having pain so get x-rays of the areas that have some bruising.  She does not seem to be any significant pain with ranging.  I suspect this is more likely mechanical in nature given  patient is wheelchair-bound at baseline.  CBC shows stable hemoglobin.  CMP shows slightly elevated creatinine but this slightly elevated 2 years ago at 1.17.  Patient is able to move around in the bed and moving her legs so again I doubt a hip fracture.  X-rays were negative  CT head does show small subdural hematoma I discussed the case with Dr. Myer Haff from neurosurgery recommends a repeat CT head in 6 hours   8:50 PM discussed the case with Henderson Newcomer the nurse over at [facility] and they understand the importance of trying to decrease the falls and they are going to discuss with her care team.  They are also going to look for the pad to put underneath her to try to prevent her from slipping out from the chair.  Patient will be handed  off to oncoming team pending repeat CT head at 1230 and family understand that if this is stable that patient can be discharged back to facility.  Patient was given a little bit of fluids to help with slight elevation in her creatinine.  Her UA without any obvious evidence for infection given significant squamous cells.  Her CK was around her baseline and patient is given some fluids for that.  Suspect patient be able to be discharged home after repeat CT head unless subdural is enlarging  The patient is on the cardiac monitor to evaluate for evidence of arrhythmia and/or significant heart rate changes.      FINAL CLINICAL IMPRESSION(S) / ED DIAGNOSES   Final diagnoses:  Fall, initial encounter  Subdural hematoma (HCC)     Rx / DC Orders   ED Discharge Orders     None        Note:  This document was prepared using Dragon voice recognition software and may include unintentional dictation errors.   Concha Se, MD 01/09/22 [phone]

## 2022-01-09 NOTE — ED Notes (Signed)
Pt imaging.

## 2022-01-09 NOTE — ED Notes (Signed)
Pt daughter back in the room, recliner provided with comfort.

## 2022-01-09 NOTE — ED Triage Notes (Addendum)
Pt presents to ED by [facility] from [facility] with CC of a fall. Pt does present with old and new bruising to forehead/eye on R side, pt is non verbal and non ambulatory. Pt also has bruising to R knee and has a wound dressing placed to L elbow area. Pt also has bruise to R breast area. EMS reports [facility] stated pt has had multiple falls but is unsure of why. EMS states this is pt's baseline per facility. Pt does not appear to grimace when ROM performed. VSS.   Pt presents to ED with 2 briefs in place.

## 2022-01-09 NOTE — ED Notes (Signed)
Received report from Jacqueline RN

## 2022-01-09 NOTE — ED Notes (Signed)
Pt's family left for the night, pt placed on a bed alarm, will continue to monitor.

## 2022-01-09 NOTE — ED Notes (Signed)
Pt is alert, non verbal, resting comfortably, breathing easy and unlabored, family at the bedside, will continue to monitor.

## 2022-01-10 ENCOUNTER — Other Ambulatory Visit: Payer: Self-pay | Admitting: Neurosurgery

## 2022-01-10 ENCOUNTER — Emergency Department: Payer: Medicare Other

## 2022-01-10 DIAGNOSIS — S065XAA Traumatic subdural hemorrhage with loss of consciousness status unknown, initial encounter: Secondary | ICD-10-CM

## 2022-01-10 NOTE — ED Notes (Signed)
[facility] notified that the pt is on her way back and updated on the pt's second CT.

## 2022-01-10 NOTE — ED Provider Notes (Signed)
Patient received in signout from Dr. Fuller Plan pending repeat CT scan of the head to evaluate progression of a small subdural hematoma after a fall.  CT interpreted by me with trace subdural seems to be getting smaller in size.  I reassessed the patient and discussed with daughter at the bedside.  We discussed to be suitable for return to her facility.  We discussed fall precautions and return precautions for the ED.  We will discharge per original plan of care.   Delton Prairie, MD 01/10/22 [phone]

## 2022-01-10 NOTE — Discharge Instructions (Addendum)
Jill Hancock would likely benefit from a bed alarm on her bed, I would also recommend using railings to prevent any further unwitnessed falls.  She had a small subdural hematoma on CT scan of her brain, after being observed for few hours and we repeated this picture it has gotten significantly smaller.  Please prevent any further falls.

## 2022-01-10 NOTE — ED Notes (Signed)
Pt taken to CT.

## 2022-01-11 ENCOUNTER — Inpatient Hospital Stay
Admission: EM | Admit: 2022-01-11 | Discharge: 2022-01-14 | DRG: 086 | Disposition: A | Discharge status: Hospice | Payer: Medicare Other | Source: Skilled Nursing Facility | Attending: Internal Medicine | Admitting: Internal Medicine

## 2022-01-11 ENCOUNTER — Emergency Department: Payer: Medicare Other

## 2022-01-11 ENCOUNTER — Other Ambulatory Visit: Payer: Self-pay

## 2022-01-11 ENCOUNTER — Encounter: Payer: Self-pay | Admitting: Emergency Medicine

## 2022-01-11 DIAGNOSIS — S065X0A Traumatic subdural hemorrhage without loss of consciousness, initial encounter: Principal | ICD-10-CM | POA: Diagnosis present

## 2022-01-11 DIAGNOSIS — Z881 Allergy status to other antibiotic agents status: Secondary | ICD-10-CM

## 2022-01-11 DIAGNOSIS — Z20822 Contact with and (suspected) exposure to covid-19: Secondary | ICD-10-CM | POA: Diagnosis present

## 2022-01-11 DIAGNOSIS — Z823 Family history of stroke: Secondary | ICD-10-CM

## 2022-01-11 DIAGNOSIS — W050XXA Fall from non-moving wheelchair, initial encounter: Secondary | ICD-10-CM | POA: Diagnosis present

## 2022-01-11 DIAGNOSIS — M810 Age-related osteoporosis without current pathological fracture: Secondary | ICD-10-CM | POA: Diagnosis present

## 2022-01-11 DIAGNOSIS — W19XXXA Unspecified fall, initial encounter: Secondary | ICD-10-CM

## 2022-01-11 DIAGNOSIS — Z79899 Other long term (current) drug therapy: Secondary | ICD-10-CM

## 2022-01-11 DIAGNOSIS — I6330 Cerebral infarction due to thrombosis of unspecified cerebral artery: Secondary | ICD-10-CM

## 2022-01-11 DIAGNOSIS — Y92122 Bedroom in nursing home as the place of occurrence of the external cause: Secondary | ICD-10-CM

## 2022-01-11 DIAGNOSIS — R29713 NIHSS score 13: Secondary | ICD-10-CM | POA: Diagnosis present

## 2022-01-11 DIAGNOSIS — I69354 Hemiplegia and hemiparesis following cerebral infarction affecting left non-dominant side: Secondary | ICD-10-CM

## 2022-01-11 DIAGNOSIS — F32A Depression, unspecified: Secondary | ICD-10-CM

## 2022-01-11 DIAGNOSIS — S51012A Laceration without foreign body of left elbow, initial encounter: Secondary | ICD-10-CM | POA: Diagnosis present

## 2022-01-11 DIAGNOSIS — E785 Hyperlipidemia, unspecified: Secondary | ICD-10-CM | POA: Diagnosis present

## 2022-01-11 DIAGNOSIS — I10 Essential (primary) hypertension: Secondary | ICD-10-CM | POA: Diagnosis present

## 2022-01-11 DIAGNOSIS — S8001XA Contusion of right knee, initial encounter: Secondary | ICD-10-CM | POA: Diagnosis present

## 2022-01-11 DIAGNOSIS — S065XAA Traumatic subdural hemorrhage with loss of consciousness status unknown, initial encounter: Secondary | ICD-10-CM

## 2022-01-11 DIAGNOSIS — K219 Gastro-esophageal reflux disease without esophagitis: Secondary | ICD-10-CM | POA: Diagnosis present

## 2022-01-11 DIAGNOSIS — Z880 Allergy status to penicillin: Secondary | ICD-10-CM

## 2022-01-11 DIAGNOSIS — I629 Nontraumatic intracranial hemorrhage, unspecified: Secondary | ICD-10-CM

## 2022-01-11 DIAGNOSIS — Z66 Do not resuscitate: Secondary | ICD-10-CM | POA: Diagnosis present

## 2022-01-11 DIAGNOSIS — I639 Cerebral infarction, unspecified: Secondary | ICD-10-CM | POA: Diagnosis present

## 2022-01-11 DIAGNOSIS — Z9181 History of falling: Secondary | ICD-10-CM

## 2022-01-11 DIAGNOSIS — F03C3 Unspecified dementia, severe, with mood disturbance: Secondary | ICD-10-CM | POA: Diagnosis present

## 2022-01-11 DIAGNOSIS — Z23 Encounter for immunization: Secondary | ICD-10-CM

## 2022-01-11 DIAGNOSIS — Z885 Allergy status to narcotic agent status: Secondary | ICD-10-CM

## 2022-01-11 DIAGNOSIS — Z8249 Family history of ischemic heart disease and other diseases of the circulatory system: Secondary | ICD-10-CM

## 2022-01-11 DIAGNOSIS — Z7983 Long term (current) use of bisphosphonates: Secondary | ICD-10-CM

## 2022-01-11 LAB — CBC WITH DIFFERENTIAL/PLATELET
Abs Immature Granulocytes: 0.04 10*3/uL (ref 0.00–0.07)
Basophils Absolute: 0 10*3/uL (ref 0.0–0.1)
Basophils Relative: 0 %
Eosinophils Absolute: 0.1 10*3/uL (ref 0.0–0.5)
Eosinophils Relative: 1 %
HCT: 38.8 % (ref 36.0–46.0)
Hemoglobin: 12.7 g/dL (ref 12.0–15.0)
Immature Granulocytes: 0 %
Lymphocytes Relative: 23 %
Lymphs Abs: 2.2 10*3/uL (ref 0.7–4.0)
MCH: 30 pg (ref 26.0–34.0)
MCHC: 32.7 g/dL (ref 30.0–36.0)
MCV: 91.7 fL (ref 80.0–100.0)
Monocytes Absolute: 0.7 10*3/uL (ref 0.1–1.0)
Monocytes Relative: 7 %
Neutro Abs: 6.6 10*3/uL (ref 1.7–7.7)
Neutrophils Relative %: 69 %
Platelets: 295 10*3/uL (ref 150–400)
RBC: 4.23 MIL/uL (ref 3.87–5.11)
RDW: 13.1 % (ref 11.5–15.5)
WBC: 9.6 10*3/uL (ref 4.0–10.5)
nRBC: 0 % (ref 0.0–0.2)

## 2022-01-11 LAB — BASIC METABOLIC PANEL
Anion gap: 11 (ref 5–15)
BUN: 24 mg/dL — ABNORMAL HIGH (ref 8–23)
CO2: 29 mmol/L (ref 22–32)
Calcium: 9.2 mg/dL (ref 8.9–10.3)
Chloride: 104 mmol/L (ref 98–111)
Creatinine, Ser: 0.84 mg/dL (ref 0.44–1.00)
GFR, Estimated: >60 mL/min (ref >60)
Glucose, Bld: 108 mg/dL — ABNORMAL HIGH (ref 70–99)
Potassium: 3.6 mmol/L (ref 3.5–5.1)
Sodium: 144 mmol/L (ref 135–145)

## 2022-01-11 LAB — CK: Total CK: 219 U/L (ref 38–234)

## 2022-01-11 LAB — PROCALCITONIN: Procalcitonin: <0.1 ng/mL

## 2022-01-11 MED ORDER — PANTOPRAZOLE SODIUM 40 MG PO TBEC
40.0000 mg | DELAYED_RELEASE_TABLET | Freq: Two times a day (BID) | ORAL | Status: DC
  Administered 2022-01-12 – 2022-01-13 (×4): 40 mg via ORAL
  Filled 2022-01-11 (×5): qty 1

## 2022-01-11 MED ORDER — FLEET ENEMA 7-19 GM/118ML RE ENEM: 1.0000 | ENEMA | Freq: Every day | RECTAL | Status: DC | PRN

## 2022-01-11 MED ORDER — ACETAMINOPHEN 325 MG PO TABS
650.0000 mg | ORAL_TABLET | ORAL | Status: DC | PRN
Start: 2022-01-11 — End: 2022-01-13

## 2022-01-11 MED ORDER — VITAMIN D 25 MCG (1000 UNIT) PO TABS
2000.0000 [IU] | ORAL_TABLET | Freq: Every day | ORAL | Status: DC
  Administered 2022-01-12 – 2022-01-13 (×2): 2000 [IU] via ORAL
  Filled 2022-01-11 (×3): qty 2

## 2022-01-11 MED ORDER — ACETAMINOPHEN 160 MG/5ML PO SOLN: 650.0000 mg | ORAL | Status: DC | PRN

## 2022-01-11 MED ORDER — FLUOXETINE HCL 20 MG PO CAPS
20.0000 mg | ORAL_CAPSULE | Freq: Every day | ORAL | Status: DC
  Administered 2022-01-12 – 2022-01-13 (×2): 20 mg via ORAL
  Filled 2022-01-11 (×3): qty 1

## 2022-01-11 MED ORDER — SENNA 8.6 MG PO TABS
2.0000 | ORAL_TABLET | Freq: Every day | ORAL | Status: DC
  Administered 2022-01-12 – 2022-01-13 (×2): 17.2 mg via ORAL
  Filled 2022-01-11 (×3): qty 2

## 2022-01-11 MED ORDER — ACETAMINOPHEN 500 MG PO TABS: 1000.0000 mg | ORAL_TABLET | Freq: Once | ORAL | Status: DC

## 2022-01-11 MED ORDER — DIPHENHYDRAMINE HCL 50 MG/ML IJ SOLN
25.0000 mg | Freq: Once | INTRAMUSCULAR | Status: AC
  Administered 2022-01-11: 25 mg via INTRAVENOUS
  Filled 2022-01-11: qty 1

## 2022-01-11 MED ORDER — LISINOPRIL 20 MG PO TABS
40.0000 mg | ORAL_TABLET | Freq: Every day | ORAL | Status: DC
  Administered 2022-01-12: 40 mg via ORAL
  Filled 2022-01-11: qty 2

## 2022-01-11 MED ORDER — TRAZODONE HCL 50 MG PO TABS
25.0000 mg | ORAL_TABLET | Freq: Every evening | ORAL | Status: DC | PRN
  Administered 2022-01-12: 25 mg via ORAL
  Filled 2022-01-11: qty 1

## 2022-01-11 MED ORDER — HALOPERIDOL LACTATE 5 MG/ML IJ SOLN
1.0000 mg | Freq: Once | INTRAMUSCULAR | Status: AC
  Administered 2022-01-11: 1 mg via INTRAVENOUS
  Filled 2022-01-11: qty 1

## 2022-01-11 MED ORDER — IOHEXOL 350 MG/ML SOLN
75.0000 mL | Freq: Once | INTRAVENOUS | Status: AC | PRN
Start: 2022-01-11 — End: 2022-01-11
  Administered 2022-01-11: 75 mL via INTRAVENOUS

## 2022-01-11 MED ORDER — DOCUSATE SODIUM 50 MG/5ML PO LIQD
100.0000 mg | Freq: Every day | ORAL | Status: DC
  Administered 2022-01-12: 100 mg via ORAL
  Filled 2022-01-11 (×3): qty 10

## 2022-01-11 MED ORDER — ONDANSETRON HCL 4 MG/2ML IJ SOLN: 4.0000 mg | INTRAMUSCULAR | Status: DC | PRN

## 2022-01-11 MED ORDER — ALENDRONATE SODIUM 70 MG PO TABS: 70.0000 mg | ORAL_TABLET | ORAL | Status: DC

## 2022-01-11 MED ORDER — MEMANTINE HCL 5 MG PO TABS
10.0000 mg | ORAL_TABLET | Freq: Two times a day (BID) | ORAL | Status: DC
  Administered 2022-01-12 – 2022-01-13 (×4): 10 mg via ORAL
  Filled 2022-01-11 (×5): qty 2

## 2022-01-11 MED ORDER — SODIUM CHLORIDE 0.9 % IV SOLN
INTRAVENOUS | Status: DC
Start: 2022-01-11 — End: 2022-01-12

## 2022-01-11 MED ORDER — AMLODIPINE BESYLATE 10 MG PO TABS
10.0000 mg | ORAL_TABLET | Freq: Every day | ORAL | Status: DC
  Administered 2022-01-12 – 2022-01-13 (×2): 10 mg via ORAL
  Filled 2022-01-11 (×2): qty 1

## 2022-01-11 MED ORDER — SENNOSIDES-DOCUSATE SODIUM 8.6-50 MG PO TABS: 1.0000 | ORAL_TABLET | Freq: Every evening | ORAL | Status: DC | PRN

## 2022-01-11 MED ORDER — MAGNESIUM OXIDE 400 MG PO TABS
600.0000 mg | ORAL_TABLET | Freq: Every day | ORAL | Status: DC
  Administered 2022-01-12 – 2022-01-13 (×2): 600 mg via ORAL
  Filled 2022-01-11: qty 1.5
  Filled 2022-01-11 (×2): qty 2
  Filled 2022-01-11 (×2): qty 1.5
  Filled 2022-01-11: qty 2

## 2022-01-11 MED ORDER — TRAZODONE HCL 50 MG PO TABS
50.0000 mg | ORAL_TABLET | Freq: Every day | ORAL | Status: DC
Start: 2022-01-11 — End: 2022-01-14
  Administered 2022-01-12 – 2022-01-13 (×2): 50 mg via ORAL
  Filled 2022-01-11 (×2): qty 1

## 2022-01-11 MED ORDER — ACETAMINOPHEN 650 MG RE SUPP: 650.0000 mg | RECTAL | Status: DC | PRN

## 2022-01-11 MED ORDER — MELATONIN 5 MG PO TABS
2.5000 mg | ORAL_TABLET | Freq: Every day | ORAL | Status: DC
  Administered 2022-01-12 – 2022-01-13 (×3): 2.5 mg via ORAL
  Filled 2022-01-11 (×3): qty 1

## 2022-01-11 MED ORDER — ENOXAPARIN SODIUM 40 MG/0.4ML IJ SOSY
40.0000 mg | PREFILLED_SYRINGE | INTRAMUSCULAR | Status: DC
  Filled 2022-01-11: qty 0.4

## 2022-01-11 MED ORDER — POLYETHYLENE GLYCOL 3350 17 G PO PACK
17.0000 g | PACK | Freq: Every day | ORAL | Status: DC
  Administered 2022-01-12 – 2022-01-13 (×2): 17 g via ORAL
  Filled 2022-01-11 (×3): qty 1

## 2022-01-11 MED ORDER — STROKE: EARLY STAGES OF RECOVERY BOOK: Freq: Once | Status: DC

## 2022-01-11 MED ORDER — DIVALPROEX SODIUM 125 MG PO CSDR
125.0000 mg | DELAYED_RELEASE_CAPSULE | Freq: Every day | ORAL | Status: DC
  Administered 2022-01-12 – 2022-01-13 (×2): 125 mg via ORAL
  Filled 2022-01-11 (×3): qty 1

## 2022-01-11 MED ORDER — ATORVASTATIN CALCIUM 20 MG PO TABS
20.0000 mg | ORAL_TABLET | Freq: Every day | ORAL | Status: DC
Start: 2022-01-11 — End: 2022-01-14
  Administered 2022-01-12 – 2022-01-13 (×2): 20 mg via ORAL
  Filled 2022-01-11 (×2): qty 1

## 2022-01-11 NOTE — ED Triage Notes (Signed)
Pt to ED via [facility] from [facility] for an unwitnessed fall this afternoon. Per Staff from [facility], CNA was in the room helping pt eat, CNA went to take tray out of the room and came back in to find pt in the floor. Pt seen here on 7/10 for fall and was diagnosed with a small brain bleed. Pt is non-verbal at baseline. Pt has hx/o dementia. Pt has yellow/purple bruising to her right temporal area. Pt has had multiple falls in the past 2 weeks.

## 2022-01-11 NOTE — ED Notes (Signed)
Pt daughters call returned

## 2022-01-11 NOTE — ED Notes (Signed)
Pt to MRI at this time.

## 2022-01-11 NOTE — ED Notes (Signed)
Dr Arville Care at bedside.  Pt's daughter, Laveda Abbe at bedside and updated on this pt's plan of care by this RN

## 2022-01-11 NOTE — ED Notes (Addendum)
Received call from [facility] by Harrel Carina requesting update for this pt after speaking with pharmacist from here. Made aware Pt is admitted   Called lab, HBA1c to include from previous blood draw

## 2022-01-11 NOTE — ED Notes (Signed)
This Clinical research associate at bedside as 1:1 Recruitment consultant at this time.

## 2022-01-11 NOTE — ED Notes (Signed)
Tech sitting at bedside.  

## 2022-01-11 NOTE — ED Notes (Signed)
ED Provider at bedside, family at bedside

## 2022-01-11 NOTE — ED Notes (Signed)
Pt pulling body on to side rail this RN at bedside. Warm blankets placed on pt in attempt for pt to relax. Consulting civil engineer called for Comptroller.

## 2022-01-11 NOTE — Progress Notes (Signed)
PHARMACIST - PHYSICIAN COMMUNICATION  CONCERNING: P&T Medication Policy Regarding Oral Bisphosphonates  RECOMMENDATION: Your order for alendronate (Fosamax), ibandronate (Boniva), or risedronate (Actonel) has been discontinued at this time.  If the patient's post-hospital medical condition warrants safe use of this class of drugs, please resume the pre-hospital regimen upon discharge.  DESCRIPTION:  Alendronate (Fosamax), ibandronate (Boniva), and risedronate (Actonel) can cause severe esophageal erosions in patients who are unable to remain upright at least 30 minutes after taking this medication.   Since brief interruptions in therapy are thought to have minimal impact on bone mineral density, the Pharmacy & Therapeutics Committee has established that bisphosphonate orders should be routinely discontinued during hospitalization.   To override this safety policy and permit administration of Boniva, Fosamax, or Actonel in the hospital, prescribers must write "DO NOT HOLD" in the comments section when placing the order for this class of medications.   Otelia Sergeant, PharmD, [facility] 01/11/2022 10:15 PM

## 2022-01-11 NOTE — ED Provider Notes (Signed)
[facility] Provider Note    Event Date/Time   First MD Initiated Contact with Patient 01/11/22 1509     (approximate)   History   Fall   HPI  Jill Hancock is a 81 y.o. female who was here 2 days ago after a fall with small subdural.  Patient has a history of hypertension, hyperlipidemia severe dementia who resides at [facility] nursing facility and had a fall.  Per staff patient CNA was in the room helping patient he and when she went to go take the tray out of the room and came back she found the patient on the floor.  She was here on 7/10 for a fall and diagnosed with a very small brain bleed.      Physical Exam   Triage Vital Signs: ED Triage Vitals [01/11/22 1503]  Enc Vitals Group     BP 129/70     Pulse Rate 69     Resp 10     Temp 98.4 F (36.9 C)     Temp Source Oral     SpO2 98 %     Weight      Height      Head Circumference      Peak Flow      Pain Score      Pain Loc      Pain Edu?      Excl. in GC?     Most recent vital signs: Vitals:   01/11/22 1503  BP: 129/70  Pulse: 69  Resp: 10  Temp: 98.4 F (36.9 C)  SpO2: 98%     General: Awake, no distress.  CV:  Good peripheral perfusion.  Resp:  Normal effort.  Abd:  No distention.  Other:  Patient is nonverbal at baseline but she can follow commands.  She is able to squeeze with her bilateral hands.  She has a similar skin tear on her left elbow and some bruising on her right knee as well as a little bit of bruising on her right side of her face.  Her pupils are equal and reactive.  She is able to spontaneously move her legs a little bit in the bed but not able to hold them up. She is non ambulatory baseline. L arm stiff but similar to when I saw 2 days ago.    ED Results / Procedures / Treatments   Labs (all labs ordered are listed, but only abnormal results are displayed) Labs Reviewed  CBC WITH DIFFERENTIAL/PLATELET  BASIC METABOLIC PANEL  CK      EKG  My interpretation of EKG:  Normal sinus rate of 68 without any ST elevation or T wave inversions, normal intervals  RADIOLOGY I have reviewed the CT head personally and there is a small subdural hematoma   PROCEDURES:  Critical Care performed: No  .1-3 Lead EKG Interpretation  Performed by: Concha Se, MD Authorized by: Concha Se, MD     Interpretation: normal     ECG rate:  60   ECG rate assessment: normal     Rhythm: sinus rhythm     Ectopy: none     Conduction: normal      MEDICATIONS ORDERED IN ED: Medications  haloperidol lactate (HALDOL) injection 1 mg (1 mg Intravenous Given 01/11/22 1645)  haloperidol lactate (HALDOL) injection 1 mg (1 mg Intravenous Given 01/11/22 1844)  diphenhydrAMINE (BENADRYL) injection 25 mg (25 mg Intravenous Given 01/11/22 1930)  haloperidol lactate (HALDOL) injection  1 mg (1 mg Intravenous Given 01/11/22 1930)     IMPRESSION / MDM / ASSESSMENT AND PLAN / ED COURSE  I reviewed the triage vital signs and the nursing notes.   Patient's presentation is most consistent with acute presentation with potential threat to life or bodily function.   Differential includes intracranial hemorrhage, cervical fracture, rib fracture, hip fracture, worsening dehydration.  Patient had a little bit of an AKI 2 days ago therefore we will repeat labs to make sure they are not getting any worse. Given unknown LLK and pt with subdural so not TNK candidate Stroke code was note called.  Pt has prior stiffness and non verbal at baseline so unlikely to be LVO candidate due to poor functional status.   Patient is daughter is now at bedside who stated that she seemed like she was not acting her normal self today and that she was having some drooling and leaning to the side and she is concerned that she might of had a stroke.  Had extensive conversation with daughter about the risk for sedation in order to get MRI and how it was unlikely to really change  what we are doing due to how often she is following but daughter understand these risk and is adamant that she would want an MRI to further evaluate.  5:53 PM discussed with the radiologist   By: Ted Mcalpine M.D. to clarify the subdural was so small it is very hard to measure by does not look bigger than the prior 1  IMPRESSION: 1. Focus of acute/subacute cortical infarct in the right parietal lobe. 2. Thin right cerebral convexity subdural hemorrhage measuring up to 2 mm without significant mass effect on the brain parenchyma. 3. 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  7:10 PM patient got a milligram of Haldol prior to going to MRI but was still pretty agitated and not able to complete the MRI therefore patient was given 25 of IV Benadryl and another 1 mg of Haldol.  According to family she has a paradoxical reaction to benzos so trying to avoid these.    Updated family on the MRI results.  We discussed the acute to subacute stroke noted on the parietal region.   Discussed with family that given her functional status that there probably would not be many changes and would be difficult to do anticoagulation given her subdural but family would still like patient to be admitted for neurology consultation to discuss prognosis, goals of care etc. I do not feel like she has a LVO but will get CTA to look for any carotid disease.  Patient will be admitted to the hospital team   The patient is on the cardiac monitor to evaluate for evidence of arrhythmia and/or significant heart rate changes.      FINAL CLINICAL IMPRESSION(S) / ED DIAGNOSES   Final diagnoses:  Fall, initial encounter  Subdural hematoma (HCC)  Cerebrovascular accident (CVA), unspecified mechanism (HCC)     Rx / DC Orders   ED Discharge Orders     None        Note:  This document was prepared using Dragon voice  recognition software and may include unintentional dictation errors.   Concha Se, MD 01/11/22 2108

## 2022-01-11 NOTE — ED Notes (Signed)
Pt placed on bed alarm

## 2022-01-11 NOTE — H&P (Incomplete)
[place]   PATIENT NAME: Jill Hancock    MR#:  [id number]  DATE OF BIRTH:  06-Aug-1940  DATE OF ADMISSION:  01/11/2022  PRIMARY CARE PHYSICIAN: Hal Morales, DO   Patient is coming from: SNF  REQUESTING/REFERRING PHYSICIAN: Marjean Donna, MD  CHIEF COMPLAINT:   Chief Complaint  Patient presents with   Fall    HISTORY OF PRESENT ILLNESS:  Jill Hancock is a 81 y.o. Caucasian female with medical history significant for anxiety, depression, hypertension, and CVA with residual left-sided weakness, who presented to the emergency room with acute onset of altered mental status and suspected worsening left-sided weakness.  According to her daughter she was drooling from her mouth and slumped over.  She was not interacting to her and she had to shake her.  SNF staff reported fever of 101 however the patient was afebrile here.  No reported chest pain or dyspnea or cough or wheezing.  No reported nausea or vomiting or diarrhea.  She had a recent fall and small subdural hemorrhage for which she was seen here and discharged on the ER.  ED Course: When she came to the ER, vital signs were within normal.  Labs revealed a BUN of 24 with otherwise unremarkable BMP.  CBC was within normal.  Noncontrasted CT scan revealed no acute intracranial normalities and C-spine CT showed no acute fracture or subluxation.  It showed multilevel osteoarthritic changes of the cervical spine. EKG as reviewed by me : Normal sinus rhythm with a rate of 68 with Q waves inferiorly Imaging:  Noncontrast head CT scan revealed: 1. Minimal subdural hematoma overlying the right frontal and temporal lobes. 2. Marked brain parenchymal volume loss and deep white matter microangiopathy. 3. Right frontoparietal scalp hematoma.  CTA of the head and neck revealed the following: 1. Negative CTA for acute large vessel occlusion or other emergent finding. 2. 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 Focal moderate proximal right V2 stenosis due to extrinsic compression from uncovertebral and facet disease at the level of C5. Dominant right vertebral artery otherwise widely patent. 3. Mild atheromatous disease elsewhere about the major arterial vasculature of the head and neck. No other hemodynamically significant or correctable stenosis.  Brain MRI without contrast revealed the following: 1. Focus of acute/subacute cortical infarct in the right parietal lobe. 2. Thin right cerebral convexity subdural hemorrhage measuring up to 2 mm without significant mass effect on the brain parenchyma. 3. 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  Portable chest x-ray showed left lower lobe infiltrate versus atelectasis.  Portable pelvic x-ray showed no acute abnormalities.  The patient was given Haldol 1 mg IV X3 and 25 mg of IV Benadryl.  She will be admitted to an observation medical telemetry bed for further evaluation and management.    PAST MEDICAL HISTORY:   Past Medical History:  Diagnosis Date   Anxiety    Depression    Hyperglycemia    Hypertension    Hypokalemia    Osteoporosis    Stroke ([place]) 02/2012   MRI revealed at least 3 subcentimeter acute infarctions with one area of subacute infarction in widely disparate vascular territories including territory of left cerebellum and around right caudate nucleus along with widespread lacunar infarcts, chronic microvascular ischemic change, and numerous microbleeds suggesting long standing hypertensive cerebrovascular disease.  MRA confirmed intracranial   Unsteady gait    Vertigo  PAST SURGICAL HISTORY:   Past Surgical History:  Procedure Laterality Date   BREAST BIOPSY Right    pt not sure when    CHOLECYSTECTOMY     INTRAMEDULLARY (IM) NAIL INTERTROCHANTERIC Left 11/21/2018   Procedure: INTRAMEDULLARY (IM) NAIL  INTERTROCHANTRIC;  Surgeon: Juanell Fairly, MD;  Location: [facility] ORS;  Service: Orthopedics;  Laterality: Left;   VAGINAL HYSTERECTOMY      SOCIAL HISTORY:   Social History   Tobacco Use   Smoking status: Never   Smokeless tobacco: Never  Substance Use Topics   Alcohol use: No    Alcohol/week: 0.0 standard drinks of alcohol    FAMILY HISTORY:   Family History  Problem Relation Age of Onset   Heart attack Mother    Heart disease Mother    Heart disease Father    Heart attack Brother        death at age 59   Stroke Brother        death at 29   Bladder Cancer Neg Hx    Kidney cancer Neg Hx     DRUG ALLERGIES:   Allergies  Allergen Reactions   Demerol [Meperidine] Nausea And Vomiting   Macrobid [Nitrofurantoin Macrocrystal] Rash   Penicillins Rash    Has patient had a PCN reaction causing immediate rash, facial/tongue/throat swelling, SOB or lightheadedness with hypotension: Unknown Has patient had a PCN reaction causing severe rash involving mucus membranes or skin necrosis: Unknown Has patient had a PCN reaction that required hospitalization: Unknown Has patient had a PCN reaction occurring within the last 10 years: No If all of the above answers are "NO", then may proceed with Cephalosporin use.     REVIEW OF SYSTEMS:   ROS As per history of present illness. All pertinent systems were reviewed above. Constitutional, HEENT, cardiovascular, respiratory, GI, GU, musculoskeletal, neuro, psychiatric, endocrine, integumentary and hematologic systems were reviewed and are otherwise negative/unremarkable except for positive findings mentioned above in the HPI.   MEDICATIONS AT HOME:   Prior to Admission medications   Medication Sig Start Date End Date Taking? Authorizing Provider  acetaminophen (TYLENOL) 325 MG tablet Take 2 tablets (650 mg total) by mouth every 4 (four) hours as needed for mild pain, moderate pain or fever (or temp > 37.5 C (99.5 F)). Patient taking  differently: Take 650 mg by mouth in the morning, at noon, in the evening, and at bedtime. 01/16/19   Auburn Bilberry, MD  alendronate (FOSAMAX) 70 MG tablet Take 1 tablet (70 mg total) by mouth every Monday. 01/27/19   Karamalegos, Netta Neat, DO  amLODipine (NORVASC) 10 MG tablet TAKE 1 TABLET BY MOUTH EVERY DAY Patient taking differently: Take 10 mg by mouth at bedtime. 08/26/18   Karamalegos, Netta Neat, DO  atorvastatin (LIPITOR) 40 MG tablet Take 1 tablet (40 mg total) by mouth daily at 6 PM. Patient taking differently: Take 40 mg by mouth at bedtime. 12/20/18   Enid Baas, MD  Cholecalciferol (VITAMIN D3) 50 MCG (2000 UT) TABS Take 2,000 Units by mouth daily.    [provider]  divalproex (DEPAKOTE SPRINKLE) 125 MG capsule 125 mg 2 (two) times daily.  03/13/19   [provider]  docusate (COLACE) 50 MG/5ML liquid Take 100 mg by mouth daily.    [provider]  FLUoxetine (PROZAC) 20 MG capsule 20 mg daily.  03/13/19   [provider]  lisinopril (PRINIVIL,ZESTRIL) 40 MG tablet Take 1 tablet (40 mg total) by mouth daily. Patient taking differently: Take  40 mg by mouth at bedtime. 02/22/18   Karamalegos, Devonne Doughty, DO  magnesium oxide (MAG-OX) 400 MG tablet Take 600 mg by mouth daily.    [provider]  Melatonin 3 MG CAPS Take by mouth.    [provider]  memantine (NAMENDA) 10 MG tablet Take 10 mg by mouth 2 (two) times daily.  03/13/19   [provider]  Multiple Vitamins-Minerals (CERTAVITE/ANTIOXIDANTS) TABS Take by mouth.    [provider]  pantoprazole (PROTONIX) 20 MG tablet Take 40 mg by mouth daily.    [provider]  polyethylene glycol (MIRALAX) 17 g packet Take 17 g by mouth daily. 02/19/21   Ward, Delice Bison, DO  senna (SENOKOT) 8.6 MG TABS tablet Take 2 tablets by mouth daily.    [provider]  sodium phosphate (FLEET) 7-19 GM/118ML ENEM Place 133 mLs (1 enema total) rectally  daily as needed for severe constipation. 02/19/21   Ward, Delice Bison, DO  traMADol (ULTRAM) 50 MG tablet Take 50 mg by mouth 3 (three) times daily as needed for moderate pain. Patient not taking: Reported on 09/09/2021    [provider]  traZODone (DESYREL) 50 MG tablet Take 50 mg by mouth at bedtime. 06/24/20   [provider]  trolamine salicylate (ASPERCREME) 10 % cream Apply 1 application topically 3 (three) times daily as needed for muscle pain. Patient not taking: Reported on 07/15/2021    [provider]  esomeprazole (NEXIUM) 20 MG capsule TAKE 1 CAPSULE (20 MG TOTAL) BY MOUTH DAILY BEFORE BREAKFAST. 12/18/18 12/23/18  Karamalegos, Devonne Doughty, DO      VITAL SIGNS:  Blood pressure (!) 141/68, pulse 67, temperature 98.4 F (36.9 C), temperature source Oral, resp. rate 17, height 5\' 2"  (1.575 m), weight 48.6 kg, SpO2 97 %.  PHYSICAL EXAMINATION:  Physical Exam  GENERAL:  81 y.o.-year-old Caucasian female patient lying in the bed with no acute distress.  The patient was somnolent and difficult arouse EYES: Pupils equal, round, reactive to light and accommodation. No scleral icterus. Extraocular muscles intact.  HEENT: Head atraumatic, normocephalic. Oropharynx and nasopharynx clear.  NECK:  Supple, no jugular venous distention. No thyroid enlargement, no tenderness.  LUNGS: Normal breath sounds bilaterally, no wheezing, rales,rhonchi or crepitation. No use of accessory muscles of respiration.  CARDIOVASCULAR: Regular rate and rhythm, S1, S2 normal. No murmurs, rubs, or gallops.  ABDOMEN: Soft, nondistended, nontender. Bowel sounds present. No organomegaly or mass.  EXTREMITIES: No pedal edema, cyanosis, or clubbing.  NEUROLOGIC: She is able to move 4 extremities.  She was somnolent and difficult to arouse.   PSYCHIATRIC: She was somnolent and occasionally responding to commands.  No good eye contact.  SKIN: Scattered contusions over her face upper and lower  extremities. LABORATORY PANEL:   CBC Recent Labs  Lab 01/11/22 1635  WBC 9.6  HGB 12.7  HCT 38.8  PLT 295   ------------------------------------------------------------------------------------------------------------------  Chemistries  Recent Labs  Lab 01/09/22 1759 01/11/22 1635  NA 139 144  K 3.7 3.6  CL 99 104  CO2 27 29  GLUCOSE 123* 108*  BUN 26* 24*  CREATININE 1.37* 0.84  CALCIUM 9.3 9.2  AST 37  --   ALT 25  --   ALKPHOS 51  --   BILITOT 0.8  --    ------------------------------------------------------------------------------------------------------------------  Cardiac Enzymes No results for input(s): "TROPONINI" in the last 168 hours. ------------------------------------------------------------------------------------------------------------------  RADIOLOGY:  CT ANGIO HEAD NECK W WO CM  Result Date: 01/11/2022 CLINICAL DATA:  Follow-up examination for stroke. EXAM: CT ANGIOGRAPHY HEAD AND NECK TECHNIQUE: Multidetector CT imaging of the head and neck was performed using the standard protocol during bolus administration of intravenous contrast. Multiplanar CT image reconstructions and MIPs were obtained to evaluate the vascular anatomy. Carotid stenosis measurements (when applicable) are obtained utilizing NASCET criteria, using the distal internal carotid diameter as the denominator. RADIATION DOSE REDUCTION: This exam was performed according to the departmental dose-optimization program which includes automated exposure control, adjustment of the mA and/or kV according to patient size and/or use of iterative reconstruction technique. CONTRAST:  56mL OMNIPAQUE IOHEXOL 350 MG/ML SOLN COMPARISON:  Prior MRI and CT from earlier the same day. FINDINGS: CTA NECK FINDINGS Aortic arch: Visualized aortic arch normal caliber with standard 3 vessel morphology. Mild atheromatous change about the arch itself. No stenosis about the origin the great vessels. Right carotid  system: Right common and internal carotid arteries patent without stenosis or dissection. Minor atheromatous change about the right carotid bulb without stenosis. Left carotid system: Left common and internal carotid arteries patent without stenosis or dissection. Minor atheromatous change about the left carotid bulb without stenosis. Vertebral arteries: Both vertebral arteries arise from subclavian arteries. Right vertebral artery strongly dominant, with a diffusely hypoplastic left vertebral artery. Left vertebral artery patent within the neck, but essentially occludes as it courses into the cranial vault. Focal moderate proximal right V2 stenosis due to extrinsic compression from uncovertebral and facet disease at the level of C5 noted (series 6, image 103). Otherwise, dominant right vertebral artery patent without stenosis or dissection. Skeleton: No discrete or worrisome osseous lesions. Cervical spine better evaluated on recent CT of the cervical spine. Other neck: No other acute soft tissue abnormality within the neck. Upper chest: Visualized upper chest demonstrates no other acute findings. Review of the MIP images confirms the above findings CTA HEAD FINDINGS Anterior circulation: Petrous segments patent bilaterally. Atheromatous change seen within the carotid siphons with associated mild multifocal narrowing. A1 segments patent. Normal anterior communicating artery complex. Anterior cerebral arteries patent without stenosis. No M1 stenosis or occlusion. Right M1 bifurcates early. No proximal MCA branch occlusion. Distal MCA branches perfused and symmetric. Posterior circulation: Dominant right V4 segment widely patent. Right PICA patent. Hypoplastic left vertebral artery largely occluded at the skull base. Basilar patent to its distal aspect without stenosis. Superior cerebellar arteries patent bilaterally. Predominant fetal type origin of the PCAs, both of which are widely patent to their distal aspects.  Venous sinuses: Grossly patent allowing for timing the contrast bolus. Anatomic variants: As above.  No aneurysm. Review of the MIP images confirms the above findings IMPRESSION: 1. Negative CTA for acute large vessel occlusion or other emergent finding. 2. 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 Focal moderate proximal right V2 stenosis due to extrinsic compression from uncovertebral and facet disease at the level of C5. Dominant right vertebral artery otherwise widely patent. 3. Mild atheromatous disease elsewhere about the major arterial vasculature of the head and neck. No other hemodynamically significant or correctable stenosis. Electronically Signed   By: Jeannine Boga M.D.   On: 01/11/2022 21:56   MR BRAIN WO CONTRAST  Result Date: 01/11/2022 CLINICAL DATA:  Stroke, follow up. EXAM: MRI HEAD WITHOUT CONTRAST TECHNIQUE: Multiplanar, multiecho pulse sequences of the brain and surrounding structures were obtained without intravenous contrast. COMPARISON:  Head CT January 11, 2022; MRI of the brain January 12, 2019 FINDINGS: Brain: Cortical focus of restricted diffusion in the right parietal lobe  with corresponding increased signal on T2, suggestive of a small acute/subacute infarct. Thin right convexity subdural fluid collection with associated susceptibility artifact, measuring up to 2 mm without significant mass effect on the brain parenchyma. No hydrocephalus or mass lesion. Extensive supratentorial and infratentorial chronic white matter disease multiple small remote infarcts within the bilateral centrum semiovale, corona radiata, basal ganglia, thalami, pons and cerebellar hemispheres. Old cortical infarcts are also noted in the bilateral frontoparietal lobes. Innumerable foci of susceptibility artifact scattered throughout the brain parenchyma with central predominance. Moderate to advanced parenchymal volume loss. Vascular: Normal flow voids. Skull and upper cervical  spine: Normal marrow signal. Sinuses/Orbits: Negative. Other: Bilateral frontal and right parietal scalp edema. IMPRESSION: 1. Focus of acute/subacute cortical infarct in the right parietal lobe. 2. Thin right cerebral convexity subdural hemorrhage measuring up to 2 mm without significant mass effect on the brain parenchyma. 3. 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 Electronically Signed   By: Pedro Earls M.D.   On: 01/11/2022 20:10   CT Cervical Spine Wo Contrast  Result Date: 01/11/2022 CLINICAL DATA:  Neck trauma. EXAM: CT CERVICAL SPINE WITHOUT CONTRAST TECHNIQUE: Multidetector CT imaging of the cervical spine was performed without intravenous contrast. Multiplanar CT image reconstructions were also generated. RADIATION DOSE REDUCTION: This exam was performed according to the departmental dose-optimization program which includes automated exposure control, adjustment of the mA and/or kV according to patient size and/or use of iterative reconstruction technique. COMPARISON:  None Available. FINDINGS: Alignment: Normal. Skull base and vertebrae: No acute fracture. No primary bone lesion or focal pathologic process. Soft tissues and spinal canal: No prevertebral fluid or swelling. No visible canal hematoma. Disc levels: Multilevel osteoarthritic changes of the cervical spine. Upper chest: Negative. Other: None. IMPRESSION: 1. No acute fracture or subluxation of the cervical spine. 2. Multilevel osteoarthritic changes of the cervical spine. Electronically Signed   By: Fidela Salisbury M.D.   On: 01/11/2022 16:05   DG Pelvis Portable  Result Date: 01/11/2022 CLINICAL DATA:  Unwitnessed fall EXAM: PORTABLE PELVIS 1-2 VIEWS COMPARISON:  None Available. FINDINGS: LEFT femur internal fixation. No acute fracture dislocation in the pelvis or hips. IMPRESSION: No acute osseous abnormality.  Electronically Signed   By: Suzy Bouchard M.D.   On: 01/11/2022 16:03   CT HEAD WO CONTRAST (5MM)  Result Date: 01/11/2022 CLINICAL DATA:  Head trauma. EXAM: CT HEAD WITHOUT CONTRAST TECHNIQUE: Contiguous axial images were obtained from the base of the skull through the vertex without intravenous contrast. RADIATION DOSE REDUCTION: This exam was performed according to the departmental dose-optimization program which includes automated exposure control, adjustment of the mA and/or kV according to patient size and/or use of iterative reconstruction technique. COMPARISON:  January 10, 2022 FINDINGS: Brain: No evidence of acute infarction, hemorrhage, hydrocephalus, extra-axial collection or mass lesion/mass effect. Marked brain parenchymal volume loss and deep white matter microangiopathy. Again seen is a minimal subdural hematoma overlying the right frontal and temporal lobes. Vascular: No hyperdense vessel or unexpected calcification. Skull: Normal. Negative for fracture or focal lesion. Sinuses/Orbits: No acute finding. Other: Right frontoparietal scalp hematoma. IMPRESSION: 1. Minimal subdural hematoma overlying the right frontal and temporal lobes. 2. Marked brain parenchymal volume loss and deep white matter microangiopathy. 3. Right frontoparietal scalp hematoma. Electronically Signed   By: Fidela Salisbury M.D.   On: 01/11/2022 16:02   DG Chest Portable 1 View  Result Date: 01/11/2022 CLINICAL DATA:  Pt to  ED via [facility] from [facility] for an unwitnessed fall this afternoon. Per Staff from [facility], CNA was in the room helping pt eat, CNA went to take tray out of the room and came back in to find pt in the floor. Pt seen.*comment was truncated*fall EXAM: PORTABLE CHEST 1 VIEW COMPARISON:  01/09/2022 FINDINGS: Normal cardiac silhouette. LEFT basilar opacification. Pulmonary edema. No pleural fluid. No acute osseous abnormality. IMPRESSION: LEFT lower lobe infiltrate versus atelectasis.  Electronically Signed   By: Genevive Bi M.D.   On: 01/11/2022 16:02      IMPRESSION AND PLAN:  Assessment and Plan: * Stroke (cerebrum) (HCC) - This is a right parietal subacute/acute cerebral infarction. - The patient had a recent fall with subsequent subdural hematoma that is stable. - The patient will be admitted to an observation medical telemetry bed. - We will follow neurochecks every 4 hours for 24 hours. - Neurology consult be obtained in a.m. - Dr. Otelia Limes was notified about the patient. - PT/OT and ST consults will be obtained. - We will follow mental status and neurochecks every 4 hours for 24 hours. -We will place her on aspirin and Plavix. - We will check fasting lipids and place her on statin therapy.   Depression - We will continue to Prozac.  Dyslipidemia - We will continue statin therapy.  Essential hypertension - We will continue her antihypertensives.  GERD (gastroesophageal reflux disease) - We will continue PPI therapy.   DVT prophylaxis: Lovenox.  Advanced Care Planning:  Code Status: The patient is DNR/DNI.   Family Communication:  The plan of care was discussed in details with the patient (and family). I answered all questions. The patient agreed to proceed with the above mentioned plan. Further management will depend upon hospital course. Disposition Plan: Back to previous home environment Consults called: Neurology. All the records are reviewed and case discussed with ED provider.  Status is: Observation   I certify that at the time of admission, it is my clinical judgment that the patient will require  hospital care extending less than 2 midnights.                            Dispo: The patient is from: Home              Anticipated d/c is to: Home              Patient currently is not medically stable to d/c.              Difficult to place patient: No  Hannah Beat M.D on 01/12/2022 at 4:19 AM  [facility]   From 7 PM-7 AM,  contact night-coverage [email or web address]  CC: Primary care physician; Sherol Dade, DO

## 2022-01-12 DIAGNOSIS — S065X0A Traumatic subdural hemorrhage without loss of consciousness, initial encounter: Secondary | ICD-10-CM | POA: Diagnosis present

## 2022-01-12 DIAGNOSIS — F03C0 Unspecified dementia, severe, without behavioral disturbance, psychotic disturbance, mood disturbance, and anxiety: Secondary | ICD-10-CM | POA: Diagnosis not present

## 2022-01-12 DIAGNOSIS — Z823 Family history of stroke: Secondary | ICD-10-CM | POA: Diagnosis not present

## 2022-01-12 DIAGNOSIS — I629 Nontraumatic intracranial hemorrhage, unspecified: Secondary | ICD-10-CM | POA: Diagnosis not present

## 2022-01-12 DIAGNOSIS — F32A Depression, unspecified: Secondary | ICD-10-CM

## 2022-01-12 DIAGNOSIS — Z7189 Other specified counseling: Secondary | ICD-10-CM | POA: Diagnosis not present

## 2022-01-12 DIAGNOSIS — F03C3 Unspecified dementia, severe, with mood disturbance: Secondary | ICD-10-CM | POA: Diagnosis present

## 2022-01-12 DIAGNOSIS — Y92122 Bedroom in nursing home as the place of occurrence of the external cause: Secondary | ICD-10-CM | POA: Diagnosis not present

## 2022-01-12 DIAGNOSIS — K219 Gastro-esophageal reflux disease without esophagitis: Secondary | ICD-10-CM | POA: Diagnosis present

## 2022-01-12 DIAGNOSIS — Z515 Encounter for palliative care: Secondary | ICD-10-CM | POA: Diagnosis not present

## 2022-01-12 DIAGNOSIS — Z7983 Long term (current) use of bisphosphonates: Secondary | ICD-10-CM | POA: Diagnosis not present

## 2022-01-12 DIAGNOSIS — Z66 Do not resuscitate: Secondary | ICD-10-CM | POA: Diagnosis present

## 2022-01-12 DIAGNOSIS — I69354 Hemiplegia and hemiparesis following cerebral infarction affecting left non-dominant side: Secondary | ICD-10-CM | POA: Diagnosis not present

## 2022-01-12 DIAGNOSIS — R296 Repeated falls: Secondary | ICD-10-CM | POA: Diagnosis not present

## 2022-01-12 DIAGNOSIS — Z9181 History of falling: Secondary | ICD-10-CM | POA: Diagnosis not present

## 2022-01-12 DIAGNOSIS — E785 Hyperlipidemia, unspecified: Secondary | ICD-10-CM

## 2022-01-12 DIAGNOSIS — Z23 Encounter for immunization: Secondary | ICD-10-CM | POA: Diagnosis present

## 2022-01-12 DIAGNOSIS — R29713 NIHSS score 13: Secondary | ICD-10-CM | POA: Diagnosis present

## 2022-01-12 DIAGNOSIS — Z79899 Other long term (current) drug therapy: Secondary | ICD-10-CM | POA: Diagnosis not present

## 2022-01-12 DIAGNOSIS — I10 Essential (primary) hypertension: Secondary | ICD-10-CM | POA: Diagnosis present

## 2022-01-12 DIAGNOSIS — S51012A Laceration without foreign body of left elbow, initial encounter: Secondary | ICD-10-CM | POA: Diagnosis present

## 2022-01-12 DIAGNOSIS — S065XAA Traumatic subdural hemorrhage with loss of consciousness status unknown, initial encounter: Secondary | ICD-10-CM | POA: Diagnosis present

## 2022-01-12 DIAGNOSIS — Z885 Allergy status to narcotic agent status: Secondary | ICD-10-CM | POA: Diagnosis not present

## 2022-01-12 DIAGNOSIS — Z8249 Family history of ischemic heart disease and other diseases of the circulatory system: Secondary | ICD-10-CM | POA: Diagnosis not present

## 2022-01-12 DIAGNOSIS — W050XXA Fall from non-moving wheelchair, initial encounter: Secondary | ICD-10-CM | POA: Diagnosis present

## 2022-01-12 DIAGNOSIS — Z20822 Contact with and (suspected) exposure to covid-19: Secondary | ICD-10-CM | POA: Diagnosis present

## 2022-01-12 DIAGNOSIS — Z881 Allergy status to other antibiotic agents status: Secondary | ICD-10-CM | POA: Diagnosis not present

## 2022-01-12 DIAGNOSIS — S0631AA Contusion and laceration of right cerebrum with loss of consciousness status unknown, initial encounter: Secondary | ICD-10-CM | POA: Diagnosis not present

## 2022-01-12 DIAGNOSIS — Z880 Allergy status to penicillin: Secondary | ICD-10-CM | POA: Diagnosis not present

## 2022-01-12 DIAGNOSIS — S8001XA Contusion of right knee, initial encounter: Secondary | ICD-10-CM | POA: Diagnosis present

## 2022-01-12 DIAGNOSIS — M810 Age-related osteoporosis without current pathological fracture: Secondary | ICD-10-CM | POA: Diagnosis present

## 2022-01-12 LAB — SARS CORONAVIRUS 2 BY RT PCR: SARS Coronavirus 2 by RT PCR: NEGATIVE

## 2022-01-12 LAB — LIPID PANEL
Cholesterol: 121 mg/dL (ref 0–200)
HDL: 59 mg/dL (ref >40)
LDL Cholesterol: 47 mg/dL (ref 0–99)
Total CHOL/HDL Ratio: 2.1 RATIO
Triglycerides: 76 mg/dL (ref <150)
VLDL: 15 mg/dL (ref 0–40)

## 2022-01-12 LAB — PROCALCITONIN: Procalcitonin: <0.1 ng/mL

## 2022-01-12 LAB — HEMOGLOBIN A1C
Hgb A1c MFr Bld: 5.2 % (ref 4.8–5.6)
Mean Plasma Glucose: 102.54 mg/dL

## 2022-01-12 NOTE — Evaluation (Signed)
Occupational Therapy Evaluation Patient Details Name: Jill Hancock MRN: [id number] DOB: 1941-01-23 Today's Date: 01/12/2022   History of Present Illness Pt is a 81 y.o. female with medical history significant for dementia, anxiety, depression, hypertension, and CVA with residual left-sided weakness, who presented to the ED 01/11/22 with acute onset of altered mental status and suspected worsening left-sided weakness. MD assessment includes stable subdural hematoma secondary to fall.   Clinical Impression   Pt was seen for OT evaluation this date. Prior to hospital admission, per family, pt was dependent for t/f and using a w/c for mobility - propelling w/c with RLE and dependent with ADLs. Pt lives at [facility]. Pt daughter reported a decline in pt status over a few months. Pt presents to acute OT demonstrating impaired ADL performance and functional mobility 2/2 decreased activity tolerance and functional strength/ROM/balance deficits. Performed UE PROM within pt comfort level - noted LUE elbow flexion contracture and spasticity. Pt currently requires TOTAL A to roll onto R and L side, pt able to move legs into knee flexion and grasp bed rail with R hand with multimodal cueing - unable to determine if pt was pushing weight through legs. No noted ulcers on bottom. Anticipate TOTAL A for pericare bed level. Pt lethargic throughout session - eyes closed intermittently, and inconsistent with following one step commands. Pt would benefit from skilled OT to address noted impairments and functional limitations (see below for any additional details). Upon hospital discharge, recommend STR to maximize pt safety and return to PLOF.      Recommendations for follow up therapy are one component of a multi-disciplinary discharge planning process, led by the attending physician.  Recommendations may be updated based on patient status, additional functional criteria and insurance authorization.   Follow Up  Recommendations  Skilled nursing-short term rehab (<3 hours/day)    Assistance Recommended at Discharge Frequent or constant Supervision/Assistance  Patient can return home with the following A lot of help with walking and/or transfers;A lot of help with bathing/dressing/bathroom;Assistance with cooking/housework;Direct supervision/assist for medications management;Assist for transportation;Help with stairs or ramp for entrance;Assistance with feeding    Functional Status Assessment  Patient has had a recent decline in their functional status and demonstrates the ability to make significant improvements in function in a reasonable and predictable amount of time.  Equipment Recommendations  Other (comment) (per next venue of care)    Recommendations for Other Services       Precautions / Restrictions Precautions Precautions: Fall Restrictions Weight Bearing Restrictions: No      Mobility Bed Mobility Overal bed mobility: Needs Assistance Bed Mobility: Rolling Rolling: Total assist         General bed mobility comments: TOTAL A to roll onto R and L side, pt able to move legs into knee flexion and grasp bed rail with R hand with multimodal cueing - unable to determine if pt was pushing weight through legs.    Transfers                   General transfer comment: remained bed level during session      Balance                                           ADL either performed or assessed with clinical judgement   ADL Overall ADL's : Needs assistance/impaired  General ADL Comments: TOTAL A to roll side to side in bed - anticipate TOTAL A for pericare bed level.      Pertinent Vitals/Pain Pain Assessment Pain Assessment:  (unable to determine)     Hand Dominance     Extremity/Trunk Assessment Upper Extremity Assessment Upper Extremity Assessment: LUE deficits/detail;Difficult to assess  due to impaired cognition LUE Deficits / Details: LUE elbow flexion contracture position and noted spasticity with attempted PROM   Lower Extremity Assessment Lower Extremity Assessment: Difficult to assess due to impaired cognition       Communication Communication Communication: Other (comment) (pt was non verbal)   Cognition Arousal/Alertness: Lethargic Behavior During Therapy: Restless, Flat affect Overall Cognitive Status: Impaired/Different from baseline Area of Impairment: Following commands                       Following Commands: Follows one step commands inconsistently       General Comments: per daughter she has noticed a decline in pt over the past few months     General Comments       Exercises     Shoulder Instructions      Home Living Family/patient expects to be discharged to:: Skilled nursing facility                             Home Equipment: Wheelchair - manual          Prior Functioning/Environment Prior Level of Function : Needs assist;History of Falls (last six months)       Physical Assist : ADLs (physical);Mobility (physical)   ADLs (physical): Feeding;Grooming;Bathing;Toileting;Dressing Mobility Comments: Per conversation with PT, pt was dependent with transfers and using w/c propelling w/ RLE. hx of 5 falls in the past 2 weeks ADLs Comments: Per family pt is dependent with ADLs        OT Problem List: Decreased strength;Decreased range of motion;Decreased activity tolerance;Impaired balance (sitting and/or standing);Decreased cognition;Impaired tone;Impaired UE functional use      OT Treatment/Interventions: Therapeutic exercise;Therapeutic activities;Patient/family education;Self-care/ADL training    OT Goals(Current goals can be found in the care plan section) Acute Rehab OT Goals Patient Stated Goal: unable to determine 2/2 impaired cognition and nonverbal communication OT Goal Formulation: Patient unable  to participate in goal setting  OT Frequency: Min 2X/week       AM-PAC OT "6 Clicks" Daily Activity     Outcome Measure Help from another person eating meals?: Total Help from another person taking care of personal grooming?: Total Help from another person toileting, which includes using toliet, bedpan, or urinal?: Total Help from another person bathing (including washing, rinsing, drying)?: Total Help from another person to put on and taking off regular upper body clothing?: Total Help from another person to put on and taking off regular lower body clothing?: Total 6 Click Score: 6   End of Session Equipment Utilized During Treatment: Other (comment) (none)  Activity Tolerance: Patient tolerated treatment well;Patient limited by lethargy Patient left: in bed;with call bell/phone within reach;with family/visitor present  OT Visit Diagnosis: Muscle weakness (generalized) (M62.81);History of falling (Z91.81)                Time: 7408-1448 OT Time Calculation (min): 25 min Charges:  OT General Charges $OT Visit: 1 Visit OT Evaluation $OT Eval Moderate Complexity: 1 Mod  [employer], OTDS  [employer] 01/12/2022, 2:21 PM

## 2022-01-12 NOTE — Assessment & Plan Note (Addendum)
-   This is a right parietal subacute/acute cerebral infarction. - The patient had a recent fall with subsequent subdural hematoma that is stable. - The patient will be admitted to an observation medical telemetry bed. - We will follow neurochecks every 4 hours for 24 hours. - Neurology consult be obtained in a.m. - Dr. Otelia Limes was notified about the patient. - PT/OT and ST consults will be obtained. - We will follow mental status and neurochecks every 4 hours for 24 hours. -We will place her on aspirin and Plavix. - We will check fasting lipids and place her on statin therapy.

## 2022-01-12 NOTE — Progress Notes (Addendum)
SLP Cancellation Note  Patient Details Name: SHRITA THIEN MRN: [id number] DOB: 01/28/1941   Cancelled treatment:       Reason Eval/Treat Not Completed: SLP screened, no needs identified, will sign off   SLP consult received and appreciated. Chart review completed. Cleared with RN. Pt unable to rouse for participation in cognitive-linguistic evaluation. Spoke with daughter, [name], at bedside. Per daughter, pt resides in SNF and is non-verbal at bedside. Cognitive-linguistic evaluation is not warranted at this time. Daughter in agreement. RN aware.  Also, noted pt passed [facility] Dysphagia Screening. Per RN and NT, pt did well with pills in applesauce yesterday. RN, NT, and daughter made aware that SLP did not have a consult for swallowing evaluation and that one would need to be entered if concern for oropharyngeal dysphagia was noted. Daughter verbalized understanding/agreement.  Time Spent Screening: [phone]  Clyde Canterbury, M.S., CCC-SLP Speech-Language Pathologist [facility] [phone] Arnette Felts)  Woodroe Chen 01/12/2022, 8:43 AM

## 2022-01-12 NOTE — Progress Notes (Addendum)
PROGRESS NOTE    Jill Hancock  [id number] DOB: [birth year] DOA: 01/11/2022 PCP: Hal Morales, DO    Brief Narrative:  81 y.o. Caucasian female with medical history significant for anxiety, depression, hypertension, and CVA with residual left-sided weakness, who presented to the emergency room with acute onset of altered mental status and suspected worsening left-sided weakness.  According to her daughter she was drooling from her mouth and slumped over.  She was not interacting to her and she had to shake her.  SNF staff reported fever of 101 however the patient was afebrile here.  No reported chest pain or dyspnea or cough or wheezing.  No reported nausea or vomiting or diarrhea.  She had a recent fall and small subdural hemorrhage for which she was seen here and discharged on the ER.  7/13: Neurology engaged.  Case discussed with Dr. Cheral Marker.  Appearance of lesion read as acute/subacute CVA is unclear.  Possibly related to history of falls.  Lengthy discussion with patient's multiple family members at bedside.  Patient lives at [facility] and has suffered 5 falls in the last 2 weeks.  Family was requesting evaluation for placement and alternative living situation.    Assessment & Plan:   Principal Problem:   Stroke (cerebrum) (HCC) Active Problems:   Hypertension   GERD (gastroesophageal reflux disease)   Essential hypertension   Dyslipidemia   Depression  Possible acute/subacute CVA versus contusion Patient presented after frequent falls.  Head CT showed possible CVA.  MRI read as acute/subacute CVA.  Discussed with neurology.  Possibly related to recent trauma.  Per family patient has had 5 falls in the last 2 weeks. Plan: Diet as tolerated Neurology follow-up PT OT consults Frequent neurochecks Hold anticoagulation and antiplatelet agents at this time [facility] consult for placement  Depression PTA Prozac  Hyperlipidemia Statin  Essential  hypertension PTA blood pressure meds  GERD PPI  Frailty, debility, functional decline Patient is non verbal at baseline, frail, likely malnourished Will consult palliative care for GOC    DVT prophylaxis: SCD Code Status: DNR Family Communication: Multiple family members at bedside Disposition Plan: Status is: Observation The patient remains OBS appropriate and will d/c before 2 midnights.   Level of care: Telemetry Medical  Consultants:  Neurology  Procedures:  None  Antimicrobials: None   Subjective: Seen and examined.  Verbally unresponsive.  Does not follow commands.  Objective: Vitals:   01/12/22 0500 01/12/22 0600 01/12/22 1100 01/12/22 1314  BP: 135/74 127/81 130/71 120/65  Pulse: 75 80  77  Resp: 16 18 15 15   Temp:    97.6 F (36.4 C)  TempSrc:    Axillary  SpO2: 93% 98%  96%  Weight:      Height:        Intake/Output Summary (Last 24 hours) at 01/12/2022 1317 Last data filed at 01/12/2022 0940 Gross per 24 hour  Intake 120 ml  Output --  Net 120 ml   Filed Weights   01/12/22 0354  Weight: 48.6 kg    Examination:  General exam: NAD.  Verbally unresponsive.  Appears frail Respiratory system: Bibasilar crackles.  Normal work of breathing.  Room air Cardiovascular system: S1-S2, RRR, no murmurs, no pedal edema Gastrointestinal system: Soft, NT/ND, normal bowel sounds Central nervous system: Alert.  Oriented x0 Extremities: Unable to assess power.  Muscle wasting noted bilaterally Skin: Periorbital left-sided ecchymoses Psychiatry: Unable to assess    Data Reviewed: I have personally reviewed following labs and imaging  studies  CBC: Recent Labs  Lab 01/09/22 1759 01/11/22 1635  WBC 10.2 9.6  NEUTROABS 7.7 6.6  HGB 14.3 12.7  HCT 43.0 38.8  MCV 90.9 91.7  PLT 307 295   Basic Metabolic Panel: Recent Labs  Lab 01/09/22 1759 01/11/22 1635  NA 139 144  K 3.7 3.6  CL 99 104  CO2 27 29  GLUCOSE 123* 108*  BUN 26* 24*   CREATININE 1.37* 0.84  CALCIUM 9.3 9.2   GFR: Estimated Creatinine Clearance: 40.3 mL/min (by C-G formula based on SCr of 0.84 mg/dL). Liver Function Tests: Recent Labs  Lab 01/09/22 1759  AST 37  ALT 25  ALKPHOS 51  BILITOT 0.8  PROT 7.4  ALBUMIN 3.9   No results for input(s): "LIPASE", "AMYLASE" in the last 168 hours. No results for input(s): "AMMONIA" in the last 168 hours. Coagulation Profile: No results for input(s): "INR", "PROTIME" in the last 168 hours. Cardiac Enzymes: Recent Labs  Lab 01/09/22 1759 01/11/22 1635  CKTOTAL 397* 219   BNP (last 3 results) No results for input(s): "PROBNP" in the last 8760 hours. HbA1C: Recent Labs    01/11/22 1635  HGBA1C 5.2   CBG: No results for input(s): "GLUCAP" in the last 168 hours. Lipid Profile: Recent Labs    01/12/22 0340  CHOL 121  HDL 59  LDLCALC 47  TRIG 76  CHOLHDL 2.1   Thyroid Function Tests: No results for input(s): "TSH", "T4TOTAL", "FREET4", "T3FREE", "THYROIDAB" in the last 72 hours. Anemia Panel: No results for input(s): "VITAMINB12", "FOLATE", "FERRITIN", "TIBC", "IRON", "RETICCTPCT" in the last 72 hours. Sepsis Labs: Recent Labs  Lab 01/11/22 1634 01/12/22 0340  PROCALCITON <0.10 <0.10    Recent Results (from the past 240 hour(s))  SARS Coronavirus 2 by RT PCR (hospital order, performed in [facility] hospital lab) *cepheid single result test* Anterior Nasal Swab     Status: None   Collection Time: 01/12/22  3:40 AM   Specimen: Anterior Nasal Swab  Result Value Ref Range Status   SARS Coronavirus 2 by RT PCR NEGATIVE NEGATIVE Final    Comment: (NOTE) SARS-CoV-2 target nucleic acids are NOT DETECTED.  The SARS-CoV-2 RNA is generally detectable in upper and lower respiratory specimens during the acute phase of infection. The lowest concentration of SARS-CoV-2 viral copies this assay can detect is 250 copies / mL. A negative result does not preclude SARS-CoV-2 infection and should  not be used as the sole basis for treatment or other patient management decisions.  A negative result may occur with improper specimen collection / handling, submission of specimen other than nasopharyngeal swab, presence of viral mutation(s) within the areas targeted by this assay, and inadequate number of viral copies (<250 copies / mL). A negative result must be combined with clinical observations, patient history, and epidemiological information.  Fact Sheet for Patients:   RoadLapTop.co.za  Fact Sheet for Healthcare Providers: http://kim-miller.com/  This test is not yet approved or  cleared by the [place] FDA and has been authorized for detection and/or diagnosis of SARS-CoV-2 by FDA under an Emergency Use Authorization (EUA).  This EUA will remain in effect (meaning this test can be used) for the duration of the COVID-19 declaration under Section 564(b)(1) of the Act, 21 U.S.C. section 360bbb-3(b)(1), unless the authorization is terminated or revoked sooner.  Performed at [facility], [address] [place], [place] [address]          Radiology Studies: CT [facility] HEAD NECK W WO  CM  Result Date: 01/11/2022 CLINICAL DATA:  Follow-up examination for stroke. EXAM: CT ANGIOGRAPHY HEAD AND NECK TECHNIQUE: Multidetector CT imaging of the head and neck was performed using the standard protocol during bolus administration of intravenous contrast. Multiplanar CT image reconstructions and MIPs were obtained to evaluate the vascular anatomy. Carotid stenosis measurements (when applicable) are obtained utilizing NASCET criteria, using the distal internal carotid diameter as the denominator. RADIATION DOSE REDUCTION: This exam was performed according to the departmental dose-optimization program which includes automated exposure control, adjustment of the mA and/or kV according to patient size and/or use of iterative  reconstruction technique. CONTRAST:  56mL OMNIPAQUE IOHEXOL 350 MG/ML SOLN COMPARISON:  Prior MRI and CT from earlier the same day. FINDINGS: CTA NECK FINDINGS Aortic arch: Visualized aortic arch normal caliber with standard 3 vessel morphology. Mild atheromatous change about the arch itself. No stenosis about the origin the great vessels. Right carotid system: Right common and internal carotid arteries patent without stenosis or dissection. Minor atheromatous change about the right carotid bulb without stenosis. Left carotid system: Left common and internal carotid arteries patent without stenosis or dissection. Minor atheromatous change about the left carotid bulb without stenosis. Vertebral arteries: Both vertebral arteries arise from subclavian arteries. Right vertebral artery strongly dominant, with a diffusely hypoplastic left vertebral artery. Left vertebral artery patent within the neck, but essentially occludes as it courses into the cranial vault. Focal moderate proximal right V2 stenosis due to extrinsic compression from uncovertebral and facet disease at the level of C5 noted (series 6, image 103). Otherwise, dominant right vertebral artery patent without stenosis or dissection. Skeleton: No discrete or worrisome osseous lesions. Cervical spine better evaluated on recent CT of the cervical spine. Other neck: No other acute soft tissue abnormality within the neck. Upper chest: Visualized upper chest demonstrates no other acute findings. Review of the MIP images confirms the above findings CTA HEAD FINDINGS Anterior circulation: Petrous segments patent bilaterally. Atheromatous change seen within the carotid siphons with associated mild multifocal narrowing. A1 segments patent. Normal anterior communicating artery complex. Anterior cerebral arteries patent without stenosis. No M1 stenosis or occlusion. Right M1 bifurcates early. No proximal MCA branch occlusion. Distal MCA branches perfused and symmetric.  Posterior circulation: Dominant right V4 segment widely patent. Right PICA patent. Hypoplastic left vertebral artery largely occluded at the skull base. Basilar patent to its distal aspect without stenosis. Superior cerebellar arteries patent bilaterally. Predominant fetal type origin of the PCAs, both of which are widely patent to their distal aspects. Venous sinuses: Grossly patent allowing for timing the contrast bolus. Anatomic variants: As above.  No aneurysm. Review of the MIP images confirms the above findings IMPRESSION: 1. Negative CTA for acute large vessel occlusion or other emergent finding. 2. 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 Focal moderate proximal right V2 stenosis due to extrinsic compression from uncovertebral and facet disease at the level of C5. Dominant right vertebral artery otherwise widely patent. 3. Mild atheromatous disease elsewhere about the major arterial vasculature of the head and neck. No other hemodynamically significant or correctable stenosis. Electronically Signed   By: Jeannine Boga M.D.   On: 01/11/2022 21:56   MR BRAIN WO CONTRAST  Result Date: 01/11/2022 CLINICAL DATA:  Stroke, follow up. EXAM: MRI HEAD WITHOUT CONTRAST TECHNIQUE: Multiplanar, multiecho pulse sequences of the brain and surrounding structures were obtained without intravenous contrast. COMPARISON:  Head CT January 11, 2022; MRI of the brain January 12, 2019 FINDINGS: Brain: Cortical focus  of restricted diffusion in the right parietal lobe with corresponding increased signal on T2, suggestive of a small acute/subacute infarct. Thin right convexity subdural fluid collection with associated susceptibility artifact, measuring up to 2 mm without significant mass effect on the brain parenchyma. No hydrocephalus or mass lesion. Extensive supratentorial and infratentorial chronic white matter disease multiple small remote infarcts within the bilateral centrum semiovale, corona  radiata, basal ganglia, thalami, pons and cerebellar hemispheres. Old cortical infarcts are also noted in the bilateral frontoparietal lobes. Innumerable foci of susceptibility artifact scattered throughout the brain parenchyma with central predominance. Moderate to advanced parenchymal volume loss. Vascular: Normal flow voids. Skull and upper cervical spine: Normal marrow signal. Sinuses/Orbits: Negative. Other: Bilateral frontal and right parietal scalp edema. IMPRESSION: 1. Focus of acute/subacute cortical infarct in the right parietal lobe. 2. Thin right cerebral convexity subdural hemorrhage measuring up to 2 mm without significant mass effect on the brain parenchyma. 3. 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 Electronically Signed   By: Baldemar Lenis M.D.   On: 01/11/2022 20:10   CT Cervical Spine Wo Contrast  Result Date: 01/11/2022 CLINICAL DATA:  Neck trauma. EXAM: CT CERVICAL SPINE WITHOUT CONTRAST TECHNIQUE: Multidetector CT imaging of the cervical spine was performed without intravenous contrast. Multiplanar CT image reconstructions were also generated. RADIATION DOSE REDUCTION: This exam was performed according to the departmental dose-optimization program which includes automated exposure control, adjustment of the mA and/or kV according to patient size and/or use of iterative reconstruction technique. COMPARISON:  None Available. FINDINGS: Alignment: Normal. Skull base and vertebrae: No acute fracture. No primary bone lesion or focal pathologic process. Soft tissues and spinal canal: No prevertebral fluid or swelling. No visible canal hematoma. Disc levels: Multilevel osteoarthritic changes of the cervical spine. Upper chest: Negative. Other: None. IMPRESSION: 1. No acute fracture or subluxation of the cervical spine. 2. Multilevel osteoarthritic changes of the cervical spine.  Electronically Signed   By: Ted Mcalpine M.D.   On: 01/11/2022 16:05   DG Pelvis Portable  Result Date: 01/11/2022 CLINICAL DATA:  Unwitnessed fall EXAM: PORTABLE PELVIS 1-2 VIEWS COMPARISON:  None Available. FINDINGS: LEFT femur internal fixation. No acute fracture dislocation in the pelvis or hips. IMPRESSION: No acute osseous abnormality. Electronically Signed   By: Genevive Bi M.D.   On: 01/11/2022 16:03   CT HEAD WO CONTRAST ( )  Result Date: 01/11/2022 CLINICAL DATA:  Head trauma. EXAM: CT HEAD WITHOUT CONTRAST TECHNIQUE: Contiguous axial images were obtained from the base of the skull through the vertex without intravenous contrast. RADIATION DOSE REDUCTION: This exam was performed according to the departmental dose-optimization program which includes automated exposure control, adjustment of the mA and/or kV according to patient size and/or use of iterative reconstruction technique. COMPARISON:  January 10, 2022 FINDINGS: Brain: No evidence of acute infarction, hemorrhage, hydrocephalus, extra-axial collection or mass lesion/mass effect. Marked brain parenchymal volume loss and deep white matter microangiopathy. Again seen is a minimal subdural hematoma overlying the right frontal and temporal lobes. Vascular: No hyperdense vessel or unexpected calcification. Skull: Normal. Negative for fracture or focal lesion. Sinuses/Orbits: No acute finding. Other: Right frontoparietal scalp hematoma. IMPRESSION: 1. Minimal subdural hematoma overlying the right frontal and temporal lobes. 2. Marked brain parenchymal volume loss and deep white matter microangiopathy. 3. Right frontoparietal scalp hematoma. Electronically Signed   By: Ted Mcalpine M.D.   On: 01/11/2022 16:02   DG Chest Portable 1 View  Result Date: 01/11/2022 CLINICAL DATA:  Pt to ED via [facility] from [facility] for an unwitnessed fall this afternoon. Per Staff from [facility], CNA was in the room helping pt eat, CNA went to  take tray out of the room and came back in to find pt in the floor. Pt seen.*comment was truncated*fall EXAM: PORTABLE CHEST 1 VIEW COMPARISON:  01/09/2022 FINDINGS: Normal cardiac silhouette. LEFT basilar opacification. Pulmonary edema. No pleural fluid. No acute osseous abnormality. IMPRESSION: LEFT lower lobe infiltrate versus atelectasis. Electronically Signed   By: Suzy Bouchard M.D.   On: 01/11/2022 16:02        Scheduled Meds:   stroke: early stages of recovery book   Does not apply Once   amLODipine  10 mg Oral QHS   atorvastatin  20 mg Oral QHS   cholecalciferol  2,000 Units Oral Daily   divalproex  125 mg Oral Daily   docusate  100 mg Oral Daily   FLUoxetine  20 mg Oral Daily   lisinopril  40 mg Oral QHS   magnesium oxide  600 mg Oral Daily   melatonin  2.5 mg Oral QHS   memantine  10 mg Oral BID   pantoprazole  40 mg Oral BID   polyethylene glycol  17 g Oral Daily   senna  2 tablet Oral Daily   traZODone  50 mg Oral QHS   Continuous Infusions:   LOS: 0 days       Sidney Ace, MD [facility]   If 7PM-7AM, please contact night-coverage  01/12/2022, 1:17 PM

## 2022-01-12 NOTE — TOC Initial Note (Signed)
Transition of Care [facility]) - Initial/Assessment Note    Patient Details  Name: Jill Hancock MRN: [id number] Date of Birth: 03/03/1941  Transition of Care [facility]) CM/SW Contact:    Maree Krabbe, LCSW Phone Number: 01/12/2022, 3:59 PM  Clinical Narrative:     CSW spoke with pt's daughter Zella Ball. Pt came from [facility] where she has been a resident for 3 years. However, family is unhappy with care and would like for pt to go to different SNF. Pt has LTC Medicaid paying for the LTC placement. Pt's family would prefer [facility], [facility], [facility], or [facility]. CSW has sent referral and reached out to facilities-- awaiting response.              Expected Discharge Plan: Skilled Nursing Facility Barriers to Discharge: Continued Medical Work up   Patient Goals and CMS Choice Patient states their goals for this hospitalization and ongoing recovery are:: for pt to go to different snf   Choice offered to / list presented to : Adult Children  Expected Discharge Plan and Services Expected Discharge Plan: Skilled Nursing Facility In-house Referral: NA   Post Acute Care Choice: Skilled Nursing Facility Living arrangements for the past 2 months: Skilled Nursing Facility                                      Prior Living Arrangements/Services Living arrangements for the past 2 months: Skilled Nursing Facility   Patient language and need for interpreter reviewed:: Yes Do you feel safe going back to the place where you live?: Yes      Need for Family Participation in Patient Care: Yes (Comment) Care giver support system in place?: Yes (comment)   Criminal Activity/Legal Involvement Pertinent to Current Situation/Hospitalization: No - Comment as needed  Activities of Daily Living      Permission Sought/Granted Permission sought to share information with : Family Supports    Share Information with NAME: Janina Mayo     Permission granted to share info w  Relationship: daughter     Emotional Assessment Appearance:: Appears stated age Attitude/Demeanor/Rapport: Unable to Assess Affect (typically observed): Unable to Assess Orientation: :  (pt non verbal) Alcohol / Substance Use: Not Applicable Psych Involvement: No (comment)  Admission diagnosis:  Subdural hematoma (HCC) [S06.5XAA] Stroke (cerebrum) (HCC) [I63.9] Intracranial bleed (HCC) [I62.9] Fall, initial encounter [W19.XXXA] Cerebrovascular accident (CVA), unspecified mechanism (HCC) [I63.9] Patient Active Problem List   Diagnosis Date Noted   Essential hypertension 01/12/2022   Dyslipidemia 01/12/2022   Depression 01/12/2022   Intracranial bleed (HCC) 01/12/2022   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   Pressure injury of back, stage 2 (HCC) 12/24/2020   UTI (urinary tract infection) 01/12/2019   Slurred speech 01/11/2019   Palliative care patient 01/02/2019   CVA (cerebral vascular accident) (HCC) 12/17/2018   Closed left hip fracture (HCC) 11/21/2018   Pre-diabetes 07/25/2018   Multiple rib fractures 12/31/2017   Primary osteoarthritis of right knee 12/14/2017   Dementia, vascular (HCC) 12/04/2017   Right shoulder injury 11/12/2015   Urinary urgency 09/17/2015   Hyperglycemia 07/29/2015   Restless leg 06/07/2015   Benign paroxysmal positional vertigo 06/07/2015   Hyperlipidemia 04/16/2015   Vitamin D deficiency 08/13/2014   T12 compression fracture (HCC) 04/28/2014   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 [place] [address] Phone: [phone] Fax:  [phone]     Social Determinants of Health (SDOH) Interventions    Readmission Risk Interventions     No data to display

## 2022-01-12 NOTE — Assessment & Plan Note (Signed)
-   We will continue PPI therapy 

## 2022-01-12 NOTE — [place] FL2 (Signed)
[place] MEDICAID FL2 LEVEL OF CARE SCREENING TOOL     IDENTIFICATION  Patient Name: Jill Hancock Birthdate: 1940-08-10 Sex: female Admission Date (Current Location): 01/11/2022  [facility] and [place] Number:  Chiropodist and Address:  [facility], [address] [place], [place] [address]      Provider Number: [id number]  Attending Physician Name and Address:  Tresa [name], MD  Relative Name and Phone Number:       Current Level of Care: Hospital Recommended Level of Care: Skilled Nursing Facility Prior Approval Number:    Date Approved/Denied:   PASRR Number: [id number]  Discharge Plan: SNF    Current Diagnoses: Patient Active Problem List   Diagnosis Date Noted   Essential hypertension 01/12/2022   Dyslipidemia 01/12/2022   Depression 01/12/2022   Intracranial bleed (HCC) 01/12/2022   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   Pressure injury of back, stage 2 (HCC) 12/24/2020   UTI (urinary tract infection) 01/12/2019   Slurred speech 01/11/2019   Palliative care patient 01/02/2019   CVA (cerebral vascular accident) (HCC) 12/17/2018   Closed left hip fracture (HCC) 11/21/2018   Pre-diabetes 07/25/2018   Multiple rib fractures 12/31/2017   Primary osteoarthritis of right knee 12/14/2017   Dementia, vascular (HCC) 12/04/2017   Right shoulder injury 11/12/2015   Urinary urgency 09/17/2015   Hyperglycemia 07/29/2015   Restless leg 06/07/2015   Benign paroxysmal positional vertigo 06/07/2015   Hyperlipidemia 04/16/2015   Vitamin D deficiency 08/13/2014   T12 compression fracture (HCC) 04/28/2014   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   Major depression, recurrent, chronic (HCC) 11/24/2012   GERD (gastroesophageal reflux disease) 11/24/2012   Osteoporosis 09/05/2012   Right hip pain 08/23/2012   Anxiety 05/04/2012   Hypertension 02/27/2012   History of cerebrovascular accident (CVA) with residual deficit 02/25/2012    Unsteady gait 02/25/2012    Orientation RESPIRATION BLADDER Height & Weight      (UTA pt non verbal)  Normal Continent Weight: 107 lb 2.3 oz (48.6 kg) Height:  5\' 2"  (157.5 cm)  BEHAVIORAL SYMPTOMS/MOOD NEUROLOGICAL BOWEL NUTRITION STATUS      Continent Diet (heart healthy, thin liquids)  AMBULATORY STATUS COMMUNICATION OF NEEDS Skin   Total Care Verbally Normal                       Personal Care Assistance Level of Assistance  Bathing, Dressing, Feeding           Functional Limitations Info  Sight, Hearing, Speech Sight Info: Adequate Hearing Info: Adequate Speech Info: Adequate    SPECIAL CARE FACTORS FREQUENCY  OT (By licensed OT), PT (By licensed PT)     PT Frequency: 5x OT Frequency: 5x            Contractures Contractures Info: Not present    Additional Factors Info  Code Status, Allergies Code Status Info: DNR Allergies Info: Demerol (Meperidine), Macrobid (Nitrofurantoin Macrocrystal), Penicillins           Current Medications (01/12/2022):  This is the current hospital active medication list Current Facility-Administered Medications  Medication Dose Route Frequency Provider Last Rate Last Admin    stroke: early stages of recovery book   Does not apply Once Mansy, Jan A, MD       acetaminophen (TYLENOL) tablet 650 mg  650 mg Oral Q4H PRN Mansy, Jan A, MD       Or   acetaminophen (TYLENOL) 160 MG/5ML solution 650 mg  650 mg  Per Tube Q4H PRN Mansy, Jan A, MD       Or   acetaminophen (TYLENOL) suppository 650 mg  650 mg Rectal Q4H PRN Mansy, Jan A, MD       amLODipine (NORVASC) tablet 10 mg  10 mg Oral QHS Mansy, Jan A, MD       atorvastatin (LIPITOR) tablet 20 mg  20 mg Oral QHS Mansy, Jan A, MD       cholecalciferol (VITAMIN D3) tablet 2,000 Units  2,000 Units Oral Daily Mansy, Jan A, MD   2,000 Units at 01/12/22 1123   divalproex (DEPAKOTE SPRINKLE) capsule 125 mg  125 mg Oral Daily Mansy, Jan A, MD   125 mg at 01/12/22 1125   docusate  (COLACE) 50 MG/5ML liquid 100 mg  100 mg Oral Daily Mansy, Jan A, MD   100 mg at 01/12/22 1125   FLUoxetine (PROZAC) capsule 20 mg  20 mg Oral Daily Mansy, Jan A, MD   20 mg at 01/12/22 1123   lisinopril (ZESTRIL) tablet 40 mg  40 mg Oral QHS Mansy, Jan A, MD       magnesium oxide (MAG-OX) tablet 600 mg  600 mg Oral Daily Mansy, Jan A, MD   600 mg at 01/12/22 1124   melatonin tablet 2.5 mg  2.5 mg Oral QHS Mansy, Jan A, MD   2.5 mg at 01/12/22 0146   memantine (NAMENDA) tablet 10 mg  10 mg Oral BID Mansy, Jan A, MD   10 mg at 01/12/22 1124   ondansetron (ZOFRAN) injection 4 mg  4 mg Intravenous Q4H PRN Mansy, Jan A, MD       pantoprazole (PROTONIX) EC tablet 40 mg  40 mg Oral BID Mansy, Jan A, MD   40 mg at 01/12/22 1124   polyethylene glycol (MIRALAX / GLYCOLAX) packet 17 g  17 g Oral Daily Mansy, Jan A, MD   17 g at 01/12/22 1125   senna (SENOKOT) tablet 17.2 mg  2 tablet Oral Daily Mansy, Jan A, MD   17.2 mg at 01/12/22 1122   senna-docusate (Senokot-S) tablet 1 tablet  1 tablet Oral QHS PRN Mansy, Jan A, MD       sodium phosphate (FLEET) 7-19 GM/118ML enema 1 enema  1 enema Rectal Daily PRN Mansy, Jan A, MD       traZODone (DESYREL) tablet 25 mg  25 mg Oral QHS PRN Mansy, Jan A, MD   25 mg at 01/12/22 0145   traZODone (DESYREL) tablet 50 mg  50 mg Oral QHS Mansy, Vernetta Honey, MD         Discharge Medications: Please see discharge summary for a list of discharge medications.  Relevant Imaging Results:  Relevant Lab Results:   Additional Information [id number]  Reuel Boom Nader Boys, LCSW

## 2022-01-12 NOTE — Assessment & Plan Note (Signed)
-   We will continue to Prozac.

## 2022-01-12 NOTE — ED Notes (Signed)
Pts family provided an update on the patient.

## 2022-01-12 NOTE — Evaluation (Signed)
Physical Therapy Evaluation Patient Details Name: Jill Hancock MRN: [id number] DOB: Jan 01, 1941 Today's Date: 01/12/2022  History of Present Illness  Pt is a 81 y.o. female with medical history significant for dementia, anxiety, depression, hypertension, and CVA with residual left-sided weakness, who presented to the ED 01/11/22 with acute onset of altered mental status and suspected worsening left-sided weakness. MD assessment includes stable subdural hematoma secondary to fall. Imaging likely indicates acute on chronic infarcts.    Clinical Impression  Pt very lethargic and difficult to arouse with both physical/verbal cues. Pt was nonverbal and has been the past few weeks according to the daughter. Pt became more alert but with very flat affect once moved from supine to sit w/ totalA to manage legs EOB and trunk control to sit up. Once up pt is unable to maintain sitting position and correct LOB w/o assist. Daughter expresses concern about pt's worsening decline. Pt was dependent with transfers and ADLs, however able to self propel in w/c using RLE. Pt may benefit from PT services in a SNF setting upon discharge to safely address deficits listed in patient problem list for decreased caregiver assistance and eventual return to PLOF.      Recommendations for follow up therapy are one component of a multi-disciplinary discharge planning process, led by the attending physician.  Recommendations may be updated based on patient status, additional functional criteria and insurance authorization.  Follow Up Recommendations Skilled nursing-short term rehab (<3 hours/day) Can patient physically be transported by private vehicle: No    Assistance Recommended at Discharge Frequent or constant Supervision/Assistance  Patient can return home with the following  Assistance with cooking/housework;Assistance with feeding;Help with stairs or ramp for entrance;A lot of help with bathing/dressing/bathroom     Equipment Recommendations None recommended by PT  Recommendations for Other Services       Functional Status Assessment Patient has had a recent decline in their functional status and demonstrates the ability to make significant improvements in function in a reasonable and predictable amount of time.     Precautions / Restrictions Precautions Precautions: Fall Restrictions Weight Bearing Restrictions: No      Mobility  Bed Mobility Overal bed mobility: Needs Assistance Bed Mobility: Supine to Sit, Sit to Supine     Supine to sit: Total assist     General bed mobility comments: totalA to manage legs EOB and trunk control to sit up. pt unable to maintain sitting position or self correct    Transfers                        Ambulation/Gait                  Stairs            Wheelchair Mobility    Modified Rankin (Stroke Patients Only)       Balance Overall balance assessment: Needs assistance Sitting-balance support: Single extremity supported Sitting balance-Leahy Scale: Poor                                       Pertinent Vitals/Pain Pain Assessment Pain Assessment:  (unable to determine)    Home Living Family/patient expects to be discharged to:: Skilled nursing facility                 Home Equipment: Wheelchair - manual      Prior Function  Prior Level of Function : Needs assist;History of Falls (last six months)             Mobility Comments: Per daughter pt was dependent with transfers and using w/c propelling w/ RLE. hx of 5 falls in the past 2 weeks ADLs Comments: Per daughter pt reports dependent w/ ADLs     Hand Dominance        Extremity/Trunk Assessment   Upper Extremity Assessment Upper Extremity Assessment: LUE deficits/detail;Difficult to assess due to impaired cognition LUE Deficits / Details: LUE elbow w/ flexion contracture    Lower Extremity Assessment Lower Extremity  Assessment: Difficult to assess due to impaired cognition       Communication   Communication: Other (comment) (pt was non verbal)  Cognition Arousal/Alertness: Lethargic Behavior During Therapy: Flat affect Overall Cognitive Status: Impaired/Different from baseline                                 General Comments: Per daughter pt has been nonverbal for the past few weeks and has noticed worsening decline.        General Comments General comments (skin integrity, edema, etc.): bruising along L side of face    Exercises     Assessment/Plan    PT Assessment Patient needs continued PT services  PT Problem List Decreased strength;Decreased mobility;Decreased activity tolerance;Decreased balance       PT Treatment Interventions Therapeutic exercise;DME instruction;Gait training;Balance training;Stair training;Functional mobility training;Therapeutic activities;Patient/family education    PT Goals (Current goals can be found in the Care Plan section)  Acute Rehab PT Goals Patient Stated Goal: none stated PT Goal Formulation: With patient Time For Goal Achievement: 01/25/22 Potential to Achieve Goals: Poor    Frequency Min 2X/week     Co-evaluation               AM-PAC PT "6 Clicks" Mobility  Outcome Measure Help needed turning from your back to your side while in a flat bed without using bedrails?: A Lot Help needed moving from lying on your back to sitting on the side of a flat bed without using bedrails?: A Lot Help needed moving to and from a bed to a chair (including a wheelchair)?: Total Help needed standing up from a chair using your arms (e.g., wheelchair or bedside chair)?: Total Help needed to walk in hospital room?: Total Help needed climbing 3-5 steps with a railing? : Total 6 Click Score: 8    End of Session Equipment Utilized During Treatment: Gait belt Activity Tolerance: Patient tolerated treatment well Patient left: in bed;with  nursing/sitter in room;with family/visitor present Nurse Communication: Mobility status PT Visit Diagnosis: Muscle weakness (generalized) (M62.81);Difficulty in walking, not elsewhere classified (R26.2)    Time: 5027-7412 PT Time Calculation (min) (ACUTE ONLY): 18 min   Charges:              Marica Otter, SPT  01/12/2022, 10:14 AM

## 2022-01-12 NOTE — Assessment & Plan Note (Signed)
-   We will continue her antihypertensives. 

## 2022-01-12 NOTE — Assessment & Plan Note (Signed)
-   We will continue statin therapy. 

## 2022-01-12 NOTE — Consult Note (Addendum)
NEURO HOSPITALIST CONSULT NOTE   Requestig physician: Dr. Georgeann Oppenheim  Reason for Consult: Right parietal lobe subdural hematoma and cerebral contusion from fall  History obtained from:  Family and Chart     HPI:                                                                                                                                          Jill Hancock is an 81 y.o. female with a history of advanced dementia, depression, HTN, osteoporosis and stroke with residual left sided weakness who presented to the ED yesterday for suspected worsening left sided weakness and acute onset of altered mental status. She had a fever reported by staff at her facility of 101 but she was afebrile in the ED. She has had about 5 falls striking her face/head in the past 2 weeks. All of the falls have resulted from her tipping forward out of her wheelchair at the facility. She has fallen onto hard concrete and there has been extensive facial bruising with coloration changes consistent with different ages of the bruises.   In the ED CT head revealed a thin subdural hematoma overlying the right frontal and temporal lobes, a right frontoparietal scalp hematoma, marked brain parenchymal volume loss and extensive deep white matter chronic small vessel ischemic changes.  CTA of the head and neck was negative for acute large vessel occlusion or other emergent finding. 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 Focal moderate proximal right V2 stenosis due to extrinsic compression from uncovertebral and facet disease at the level of C5. Dominant right vertebral artery otherwise widely patent. Mild atheromatous disease elsewhere about the major arterial vasculature of the head and neck. No other hemodynamically significant or correctable stenosis.  Brain MRI without contrast revealed a small focus of DWI hyperintensity at the tip of a parietal lobe gyrus and adjacent to  the subdural hematoma, appearing most consistent with an acute cerebral contusion, but which was read by Radiology as representing an acute/subacute cortical infarct. The thin right cerebral convexity subdural hemorrhage measures up to 2 mm without significant mass effect on the brain parenchyma. Advanced chronic microvascular disease with multiple remote cortical and deep infarcts as well as chronic multifocal microhemorrhages was also noted.  Past Medical History:  Diagnosis Date   Anxiety    Depression    Hyperglycemia    Hypertension    Hypokalemia    Osteoporosis    Stroke (HCC) 02/2012   MRI revealed at least 3 subcentimeter acute infarctions with one area of subacute infarction in widely disparate vascular territories including territory of left cerebellum and around right caudate nucleus along with widespread lacunar infarcts, chronic microvascular ischemic change, and numerous microbleeds suggesting long standing hypertensive cerebrovascular disease.  MRA confirmed  intracranial   Unsteady gait    Vertigo     Past Surgical History:  Procedure Laterality Date   BREAST BIOPSY Right    pt not sure when    CHOLECYSTECTOMY     INTRAMEDULLARY (IM) NAIL INTERTROCHANTERIC Left 11/21/2018   Procedure: INTRAMEDULLARY (IM) NAIL INTERTROCHANTRIC;  Surgeon: Juanell Fairly, MD;  Location: [facility]S;  Service: Orthopedics;  Laterality: Left;   VAGINAL HYSTERECTOMY      Family History  Problem Relation Age of Onset   Heart attack Mother    Heart disease Mother    Heart disease Father    Heart attack Brother        death at age 71   Stroke Brother        death at 60   Bladder Cancer Neg Hx    Kidney cancer Neg Hx              Social History:  reports that she has never smoked. She has never used smokeless tobacco. She reports that she does not drink alcohol and does not use drugs.  Allergies  Allergen Reactions   Demerol [Meperidine] Nausea And Vomiting   Macrobid [Nitrofurantoin  Macrocrystal] Rash   Penicillins Rash    Has patient had a PCN reaction causing immediate rash, facial/tongue/throat swelling, SOB or lightheadedness with hypotension: Unknown Has patient had a PCN reaction causing severe rash involving mucus membranes or skin necrosis: Unknown Has patient had a PCN reaction that required hospitalization: Unknown Has patient had a PCN reaction occurring within the last 10 years: No If all of the above answers are "NO", then may proceed with Cephalosporin use.     MEDICATIONS:                                                                                                                     Prior to Admission:  Medications Prior to Admission  Medication Sig Dispense Refill Last Dose   amLODipine (NORVASC) 10 MG tablet TAKE 1 TABLET BY MOUTH EVERY DAY (Patient taking differently: Take 10 mg by mouth at bedtime.) 90 tablet 1 01/11/2022 at 0900   Cholecalciferol (VITAMIN D3) 50 MCG (2000 UT) TABS Take 2,000 Units by mouth daily.   01/11/2022 at 0900   docusate (COLACE) 50 MG/5ML liquid Take 100 mg by mouth 2 (two) times daily.   01/11/2022 at 0900   FLUoxetine (PROZAC) 20 MG capsule Take 20 mg by mouth at bedtime.   01/11/2022 at 0900   lisinopril (PRINIVIL,ZESTRIL) 40 MG tablet Take 1 tablet (40 mg total) by mouth daily. (Patient taking differently: Take 40 mg by mouth at bedtime.) 90 tablet 3 01/11/2022 at 0900   magnesium oxide (MAG-OX) 400 MG tablet Take 600 mg by mouth daily.   01/11/2022 at 0900   memantine (NAMENDA) 10 MG tablet Take 10 mg by mouth 2 (two) times daily.    01/11/2022 at 0900   Multiple Vitamins-Minerals (CERTAVITE/ANTIOXIDANTS) TABS Take 1 tablet by mouth daily.   01/11/2022 at 0900  pantoprazole (PROTONIX) 40 MG tablet Take 40 mg by mouth 2 (two) times daily.   01/11/2022 at 0900   polyethylene glycol (MIRALAX) 17 g packet Take 17 g by mouth daily. 30 each 0 01/11/2022 at 0900   senna (SENOKOT) 8.6 MG TABS tablet Take 2 tablets by mouth 2 (two)  times daily.   01/11/2022 at 0900   acetaminophen (TYLENOL) 325 MG tablet Take 2 tablets (650 mg total) by mouth every 4 (four) hours as needed for mild pain, moderate pain or fever (or temp > 37.5 C (99.5 F)). (Patient taking differently: Take 650 mg by mouth in the morning, at noon, in the evening, and at bedtime.)   prn at prn   alendronate (FOSAMAX) 70 MG tablet Take 1 tablet (70 mg total) by mouth every Monday. 12 tablet 3 01/09/2022 at 0600   atorvastatin (LIPITOR) 40 MG tablet Take 1 tablet (40 mg total) by mouth daily at 6 PM. (Patient taking differently: Take 40 mg by mouth at bedtime.) 30 tablet 2 01/10/2022 at 2000   divalproex (DEPAKOTE SPRINKLE) 125 MG capsule 125 mg 2 (two) times daily.    01/10/2022 at 2100   melatonin 3 MG TABS tablet Take 6 mg by mouth at bedtime.   01/10/2022 at 2100   sodium phosphate (FLEET) 7-19 GM/118ML ENEM Place 133 mLs (1 enema total) rectally daily as needed for severe constipation. (Patient not taking: Reported on 01/12/2022) 133 mL 3 Completed Course   traMADol (ULTRAM) 50 MG tablet Take 50 mg by mouth 3 (three) times daily as needed for moderate pain. (Patient not taking: Reported on 09/09/2021)      traZODone (DESYREL) 50 MG tablet Take 50 mg by mouth at bedtime.   01/10/2022 at 2100   trolamine salicylate (ASPERCREME) 10 % cream Apply 1 application topically 3 (three) times daily as needed for muscle pain. (Patient not taking: Reported on 07/15/2021)      Scheduled:   stroke: early stages of recovery book   Does not apply Once   amLODipine  10 mg Oral QHS   atorvastatin  20 mg Oral QHS   cholecalciferol  2,000 Units Oral Daily   divalproex  125 mg Oral Daily   docusate  100 mg Oral Daily   FLUoxetine  20 mg Oral Daily   lisinopril  40 mg Oral QHS   magnesium oxide  600 mg Oral Daily   melatonin  2.5 mg Oral QHS   memantine  10 mg Oral BID   pantoprazole  40 mg Oral BID   polyethylene glycol  17 g Oral Daily   senna  2 tablet Oral Daily   traZODone  50  mg Oral QHS    ROS:                                                                                                                                       Unable  to obtain due to severe dementia.   Blood pressure 114/66, pulse 72, temperature 98.6 F (37 C), resp. rate 16, height  (1.575 m), weight 48.6 kg, SpO2 96 %.   General Examination:                                                                                                       Physical Exam  HEENT-  Multiple large facial bruises of different ages.   Lungs- Respirations unlabored Extremities- No edema  Neurological Examination Mental Status: Awake and non-agitated. Mute with no speech output spontaneously or to stimulation. Not following any commands. Will glance at staff members and examiner at times.  Cranial Nerves: II: Does not blink to threat, but will fixate on examiner's face at times.   III,IV, VI: Eyes are conjugate near the midline. Does not follow commands for assessment of tracking.  V: Unable to formally assess due to dementia. VII: Face is with symmetric tone but patient does not smile to command.  VIII: Unable to assess IX,X: Gag reflex deferred XI: Head is midline XII: Does not follow commands for testing.  Motor/Sensory: LUE with increased flexor tone, held abducted at shoulder and flexed at elbow and wrist. Does not move to command or noxious.  RUE with paratonia, decreased muscle bulk. Does not move to light pinch BLE are flexed at the knees, do not move to light pinch, with increased tone throughout.   Deep Tendon Reflexes: 1-2+ brachioradialis and patellae bilaterally. Brisker to LUE.  Plantars: Equivocal bilaterally Cerebellar/Gait: Unable to assess    Lab Results: Basic Metabolic Panel: Recent Labs  Lab 01/09/22 1759 01/11/22 1635  NA 139 144  K 3.7 3.6  CL 99 104  CO2 27 29  GLUCOSE 123* 108*  BUN 26* 24*  CREATININE 1.37* 0.84  CALCIUM 9.3 9.2    CBC: Recent Labs  Lab  01/09/22 1759 01/11/22 1635  WBC 10.2 9.6  NEUTROABS 7.7 6.6  HGB 14.3 12.7  HCT 43.0 38.8  MCV 90.9 91.7  PLT 307 295    Cardiac Enzymes: Recent Labs  Lab 01/09/22 1759 01/11/22 1635  CKTOTAL 397* 219    Lipid Panel: Recent Labs  Lab 01/12/22 0340  CHOL 121  TRIG 76  HDL 59  CHOLHDL 2.1  VLDL 15  LDLCALC 47    Imaging: CT ANGIO HEAD NECK W WO CM  Result Date: 01/11/2022 CLINICAL DATA:  Follow-up examination for stroke. EXAM: CT ANGIOGRAPHY HEAD AND NECK TECHNIQUE: Multidetector CT imaging of the head and neck was performed using the standard protocol during bolus administration of intravenous contrast. Multiplanar CT image reconstructions and MIPs were obtained to evaluate the vascular anatomy. Carotid stenosis measurements (when applicable) are obtained utilizing NASCET criteria, using the distal internal carotid diameter as the denominator. RADIATION DOSE REDUCTION: This exam was performed according to the departmental dose-optimization program which includes automated exposure control, adjustment of the mA and/or kV according to patient size and/or use of iterative reconstruction technique. CONTRAST:  75mL OMNIPAQUE IOHEXOL 350 MG/ML SOLN COMPARISON:  Prior MRI and CT from  earlier the same day. FINDINGS: CTA NECK FINDINGS Aortic arch: Visualized aortic arch normal caliber with standard 3 vessel morphology. Mild atheromatous change about the arch itself. No stenosis about the origin the great vessels. Right carotid system: Right common and internal carotid arteries patent without stenosis or dissection. Minor atheromatous change about the right carotid bulb without stenosis. Left carotid system: Left common and internal carotid arteries patent without stenosis or dissection. Minor atheromatous change about the left carotid bulb without stenosis. Vertebral arteries: Both vertebral arteries arise from subclavian arteries. Right vertebral artery strongly dominant, with a diffusely  hypoplastic left vertebral artery. Left vertebral artery patent within the neck, but essentially occludes as it courses into the cranial vault. Focal moderate proximal right V2 stenosis due to extrinsic compression from uncovertebral and facet disease at the level of C5 noted (series 6, image 103). Otherwise, dominant right vertebral artery patent without stenosis or dissection. Skeleton: No discrete or worrisome osseous lesions. Cervical spine better evaluated on recent CT of the cervical spine. Other neck: No other acute soft tissue abnormality within the neck. Upper chest: Visualized upper chest demonstrates no other acute findings. Review of the MIP images confirms the above findings CTA HEAD FINDINGS Anterior circulation: Petrous segments patent bilaterally. Atheromatous change seen within the carotid siphons with associated mild multifocal narrowing. A1 segments patent. Normal anterior communicating artery complex. Anterior cerebral arteries patent without stenosis. No M1 stenosis or occlusion. Right M1 bifurcates early. No proximal MCA branch occlusion. Distal MCA branches perfused and symmetric. Posterior circulation: Dominant right V4 segment widely patent. Right PICA patent. Hypoplastic left vertebral artery largely occluded at the skull base. Basilar patent to its distal aspect without stenosis. Superior cerebellar arteries patent bilaterally. Predominant fetal type origin of the PCAs, both of which are widely patent to their distal aspects. Venous sinuses: Grossly patent allowing for timing the contrast bolus. Anatomic variants: As above.  No aneurysm. Review of the MIP images confirms the above findings IMPRESSION: 1. Negative CTA for acute large vessel occlusion or other emergent finding. 2. 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 Focal moderate proximal right V2 stenosis due to extrinsic compression from uncovertebral and facet disease at the level of C5. Dominant right  vertebral artery otherwise widely patent. 3. Mild atheromatous disease elsewhere about the major arterial vasculature of the head and neck. No other hemodynamically significant or correctable stenosis. Electronically Signed   By: Rise Mu M.D.   On: 01/11/2022 21:56   MR BRAIN WO CONTRAST  Result Date: 01/11/2022 CLINICAL DATA:  Stroke, follow up. EXAM: MRI HEAD WITHOUT CONTRAST TECHNIQUE: Multiplanar, multiecho pulse sequences of the brain and surrounding structures were obtained without intravenous contrast. COMPARISON:  Head CT January 11, 2022; MRI of the brain January 12, 2019 FINDINGS: Brain: Cortical focus of restricted diffusion in the right parietal lobe with corresponding increased signal on T2, suggestive of a small acute/subacute infarct. Thin right convexity subdural fluid collection with associated susceptibility artifact, measuring up to 2 mm without significant mass effect on the brain parenchyma. No hydrocephalus or mass lesion. Extensive supratentorial and infratentorial chronic white matter disease multiple small remote infarcts within the bilateral centrum semiovale, corona radiata, basal ganglia, thalami, pons and cerebellar hemispheres. Old cortical infarcts are also noted in the bilateral frontoparietal lobes. Innumerable foci of susceptibility artifact scattered throughout the brain parenchyma with central predominance. Moderate to advanced parenchymal volume loss. Vascular: Normal flow voids. Skull and upper cervical spine: Normal marrow signal. Sinuses/Orbits: Negative. Other: Bilateral  frontal and right parietal scalp edema. IMPRESSION: 1. Focus of acute/subacute cortical infarct in the right parietal lobe. 2. Thin right cerebral convexity subdural hemorrhage measuring up to 2 mm without significant mass effect on the brain parenchyma. 3. 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  Electronically Signed   By: Baldemar LenisKatyucia  de Macedo Rodrigues M.D.   On: 01/11/2022 20:10   CT Cervical Spine Wo Contrast  Result Date: 01/11/2022 CLINICAL DATA:  Neck trauma. EXAM: CT CERVICAL SPINE WITHOUT CONTRAST TECHNIQUE: Multidetector CT imaging of the cervical spine was performed without intravenous contrast. Multiplanar CT image reconstructions were also generated. RADIATION DOSE REDUCTION: This exam was performed according to the departmental dose-optimization program which includes automated exposure control, adjustment of the mA and/or kV according to patient size and/or use of iterative reconstruction technique. COMPARISON:  None Available. FINDINGS: Alignment: Normal. Skull base and vertebrae: No acute fracture. No primary bone lesion or focal pathologic process. Soft tissues and spinal canal: No prevertebral fluid or swelling. No visible canal hematoma. Disc levels: Multilevel osteoarthritic changes of the cervical spine. Upper chest: Negative. Other: None. IMPRESSION: 1. No acute fracture or subluxation of the cervical spine. 2. Multilevel osteoarthritic changes of the cervical spine. Electronically Signed   By: Ted Mcalpineobrinka  Dimitrova M.D.   On: 01/11/2022 16:05   DG Pelvis Portable  Result Date: 01/11/2022 CLINICAL DATA:  Unwitnessed fall EXAM: PORTABLE PELVIS 1-2 VIEWS COMPARISON:  None Available. FINDINGS: LEFT femur internal fixation. No acute fracture dislocation in the pelvis or hips. IMPRESSION: No acute osseous abnormality. Electronically Signed   By: Genevive BiStewart  Edmunds M.D.   On: 01/11/2022 16:03   CT HEAD WO CONTRAST (5MM)  Result Date: 01/11/2022 CLINICAL DATA:  Head trauma. EXAM: CT HEAD WITHOUT CONTRAST TECHNIQUE: Contiguous axial images were obtained from the base of the skull through the vertex without intravenous contrast. RADIATION DOSE REDUCTION: This exam was performed according to the departmental dose-optimization program which includes automated exposure control, adjustment  of the mA and/or kV according to patient size and/or use of iterative reconstruction technique. COMPARISON:  January 10, 2022 FINDINGS: Brain: No evidence of acute infarction, hemorrhage, hydrocephalus, extra-axial collection or mass lesion/mass effect. Marked brain parenchymal volume loss and deep white matter microangiopathy. Again seen is a minimal subdural hematoma overlying the right frontal and temporal lobes. Vascular: No hyperdense vessel or unexpected calcification. Skull: Normal. Negative for fracture or focal lesion. Sinuses/Orbits: No acute finding. Other: Right frontoparietal scalp hematoma. IMPRESSION: 1. Minimal subdural hematoma overlying the right frontal and temporal lobes. 2. Marked brain parenchymal volume loss and deep white matter microangiopathy. 3. Right frontoparietal scalp hematoma. Electronically Signed   By: Ted Mcalpineobrinka  Dimitrova M.D.   On: 01/11/2022 16:02   DG Chest Portable 1 View  Result Date: 01/11/2022 CLINICAL DATA:  [facility] CNA was in the room helping pt eat, CNA went to take tray out of the room and came back in to find pt in the floor. Pt seen.*comment was truncated*fall EXAM: PORTABLE CHEST 1 VIEW COMPARISON:  01/09/2022 FINDINGS: Normal cardiac silhouette. LEFT basilar opacification. Pulmonary edema. No pleural fluid. No acute osseous abnormality. IMPRESSION: LEFT lower lobe infiltrate versus atelectasis. Electronically Signed   By: Genevive BiStewart  Edmunds M.D.   On: 01/11/2022 16:02     Assessment: 81 year old female who presented to the hospital after a fall out  of wheelchair striking her head, with extensive bruising, subcutaneous edema, right posterior parietal acute to subacute subdural hematoma and focal cerebral contusion adjacent to the subdural that was read by Radiology as being secondary to a stroke.  1. Exam reveals a severely debilitated elderly female with facial bruises  at separate locations and of different ages, with diffusely increased muscle tone who does not speak or follow commands.  2. Her dementia is classifiable as severe 3. Although there is a small probability of the right parietal contusion being an ischemic infarction, a focal cerebral contusion is felt to be much more likely based on my review of the images from her MRI performed this admission.  4. Imaging findings are further documented in the HPI 5. Discussed the above with her daughter over the telephone. Daughter expressed understanding and all questions were answered.   Recommendations: 1. Monitor for possible clinical signs of worsening of the right subdural hematoma, which currently is thin and does not exert significant mass effect.  2. Avoid anticoagulants and antiplatelet agents 3. Significant falls risk. May need placement in a different facility that can better manage patients who are debilitated and at high risk of falls.  4. Case Management will be needed to provide assistance for placement as family wishes to pursue alternatives to the patient's current living arrangements.  5. Neurohospitalist service will sign off. Please call if there are any additional questions.   Electronically signed: Dr. Caryl Pina 01/12/2022, 3:57 PM

## 2022-01-13 ENCOUNTER — Inpatient Hospital Stay: Payer: Medicare Other

## 2022-01-13 DIAGNOSIS — I629 Nontraumatic intracranial hemorrhage, unspecified: Secondary | ICD-10-CM

## 2022-01-13 DIAGNOSIS — Z7189 Other specified counseling: Secondary | ICD-10-CM | POA: Diagnosis not present

## 2022-01-13 DIAGNOSIS — F03C0 Unspecified dementia, severe, without behavioral disturbance, psychotic disturbance, mood disturbance, and anxiety: Secondary | ICD-10-CM | POA: Diagnosis not present

## 2022-01-13 DIAGNOSIS — Z515 Encounter for palliative care: Secondary | ICD-10-CM

## 2022-01-13 DIAGNOSIS — S065XAA Traumatic subdural hemorrhage with loss of consciousness status unknown, initial encounter: Secondary | ICD-10-CM | POA: Diagnosis not present

## 2022-01-13 LAB — BASIC METABOLIC PANEL
Anion gap: 16 — ABNORMAL HIGH (ref 5–15)
BUN: 19 mg/dL (ref 8–23)
CO2: 25 mmol/L (ref 22–32)
Calcium: 9.2 mg/dL (ref 8.9–10.3)
Chloride: 100 mmol/L (ref 98–111)
Creatinine, Ser: 0.88 mg/dL (ref 0.44–1.00)
GFR, Estimated: >60 mL/min (ref >60)
Glucose, Bld: 135 mg/dL — ABNORMAL HIGH (ref 70–99)
Potassium: 3.6 mmol/L (ref 3.5–5.1)
Sodium: 141 mmol/L (ref 135–145)

## 2022-01-13 MED ORDER — ACETAMINOPHEN 325 MG PO TABS
650.0000 mg | ORAL_TABLET | Freq: Three times a day (TID) | ORAL | Status: DC
  Administered 2022-01-13 – 2022-01-14 (×2): 650 mg via ORAL
  Filled 2022-01-13 (×2): qty 2

## 2022-01-13 MED ORDER — SODIUM CHLORIDE 0.9 % IV SOLN
INTRAVENOUS | Status: DC
Start: 2022-01-13 — End: 2022-01-14

## 2022-01-13 NOTE — Consult Note (Signed)
Consultation Note Date: 01/13/2022   Patient Name: Jill Hancock  DOB: 1941/02/04  MRN: [id number]  Age / Sex: 81 y.o., female  PCP: Hal Morales, DO Referring Physician: Sidney Ace, MD  Reason for Consultation: Establishing goals of care and Hospice Evaluation  HPI/Patient Profile: 81 y.o. female  with past medical history of dementia, stroke with residual left-sided weakness, hypertension, anxiety, depression admitted on 01/11/2022 with recent fall with subdural hematoma and recurrent falls at facility and now concern for possible acute/subacute stroke vs more likely contusion with signs of drooling and slumped over. Followed by outpatient palliative care with [facility]. Family looking into alternative facility placement that may be able to better prevent falls.   Clinical Assessment and Goals of Care: I met today at Jill Hancock's bedside this morning after reviewing records and discussing with RN, NT sitter, and Dr. Priscella Mann. Jill Hancock is very lethargic and does not really respond during my exam except for withdraw to tactile stimuli. She has been sleeping throughout the night and morning without any efforts to get out of bed. She finally had some urine output (last documented was yesterday morning) and NT was able to get her to eat some of her breakfast and she was drinking entire juice and some water fairly well per RN.   I called and left voicemail for daughter, Cecille Rubin. I was called later in the day to bedside as family have arrived and have questions. I went to bedside and initially met with daughter Shirlean Mylar and granddaughter. We were joined by 2 other daughters including Samuel Germany. We had a long conversation regarding Jill Hancock's status which is declined from yesterday. Family are naturally very concerned. We reviewed potential causes for decline. We reviewed that the most significant barrier to  improvement is her advanced dementia in the setting of brain injury and hospitalization. We discussed path forward may be very difficult as she will be most unlikely unable to peddle around in wheelchair as before and being more bed-bound and less mobile will lead to further decline and complications. We discussed poor intake that can lead to very limited prognosis. We discussed hospice facility vs SNF with hospice in place. She very much would benefit from hospice moving forward. Family want her comfortable and they want to ensure she gets the care she needs. They do not want to go to hospice prematurely and express concerns that they are not prepared for full comfort. They also express concern for hospice "morphine" people out - I explained uses of morphine and when/why this would be indicated. I recommended consideration of SNF placement with hospice as a happy medium to provide her additional support but some time to allow them to continue encouraging intake and continue medications. At this time they would like more information from lab work as well as CT head to help them make decisions. They would be more accepting of hospice placement if they had objective data to clarify poor prognosis and worsening status. In the meantime they would desire conservative treatment with  IVF and antibiotics as indicated.   I called and discussed plans with Dr. Unknown Foley who will order CT, labs, and IV fluids. He will follow up with family tomorrow to discuss findings and recommendations moving forward. Family updated after conversation and plan. All questions/concerns addressed. Emotional support provided.   Primary Decision Maker HCPOA daughter [name]    SUMMARY OF RECOMMENDATIONS   - DNR - Family considering hospice options  Code Status/Advance Care Planning: DNR   Symptom Management:  Scheduled some Tylenol to assist with discomfort.   Palliative Prophylaxis:  Aspiration, Bowel Regimen, Delirium Protocol,  Frequent Pain Assessment, and Turn Reposition  Prognosis:  Overall prognosis poor. Likely weeks. End stage dementia FAST 7c complicated by falls and SDH.   Discharge Planning: SNF with hospice vs hospice facility.       Primary Diagnoses: Present on Admission:  Stroke (cerebrum) (HCC)  Hypertension  GERD (gastroesophageal reflux disease)   I have reviewed the medical record, interviewed the patient and family, and examined the patient. The following aspects are pertinent.  Past Medical History:  Diagnosis Date   Anxiety    Depression    Hyperglycemia    Hypertension    Hypokalemia    Osteoporosis    Stroke (HCC) 02/2012   MRI revealed at least 3 subcentimeter acute infarctions with one area of subacute infarction in widely disparate vascular territories including territory of left cerebellum and around right caudate nucleus along with widespread lacunar infarcts, chronic microvascular ischemic change, and numerous microbleeds suggesting long standing hypertensive cerebrovascular disease.  MRA confirmed intracranial   Unsteady gait    Vertigo    Social History   Socioeconomic History   Marital status: Divorced    Spouse name: Not on file   Number of children: 5   Years of education: Graduate - RN   Highest education level: Associate degree: occupational, Scientist, product/process development, or vocational program  Occupational History   Occupation: Retired Charity fundraiser  Tobacco Use   Smoking status: Never   Smokeless tobacco: Never  Vaping Use   Vaping Use: Never used  Substance and Sexual Activity   Alcohol use: No    Alcohol/week: 0.0 standard drinks of alcohol   Drug use: No   Sexual activity: Yes  Other Topics Concern   Not on file  Social History Narrative   Lives at home alone, has 4 daughters that come and help daily   Social Determinants of Health   Financial Resource Strain: Low Risk  (03/21/2018)   Overall Financial Resource Strain (CARDIA)    Difficulty of Paying Living Expenses: Not  hard at all  Food Insecurity: No Food Insecurity (03/21/2018)   Hunger Vital Sign    Worried About Running Out of Food in the Last Year: Never true    Ran Out of Food in the Last Year: Never true  Transportation Needs: No Transportation Needs (03/21/2018)   PRAPARE - Administrator, Civil Service (Medical): No    Lack of Transportation (Non-Medical): No  Physical Activity: Inactive (03/21/2018)   Exercise Vital Sign    Days of Exercise per Week: 0 days    Minutes of Exercise per Session: 0 min  Stress: No Stress Concern Present (03/21/2018)   [employer] of Occupational Health - Occupational Stress Questionnaire    Feeling of Stress : Only a little  Social Connections: Moderately Isolated (03/21/2018)   Social Connection and Isolation Panel [NHANES]    Frequency of Communication with Friends and Family: Once a week  Frequency of Social Gatherings with Friends and Family: Twice a week    Attends Religious Services: Never    Marine scientist or Organizations: No    Attends Music therapist: Never    Marital Status: Divorced   Family History  Problem Relation Age of Onset   Heart attack Mother    Heart disease Mother    Heart disease Father    Heart attack Brother        death at age 50   Stroke Brother        death at 81   Bladder Cancer Neg Hx    Kidney cancer Neg Hx    Scheduled Meds:   stroke: early stages of recovery book   Does not apply Once   amLODipine  10 mg Oral QHS   atorvastatin  20 mg Oral QHS   cholecalciferol  2,000 Units Oral Daily   divalproex  125 mg Oral Daily   docusate  100 mg Oral Daily   FLUoxetine  20 mg Oral Daily   lisinopril  40 mg Oral QHS   magnesium oxide  600 mg Oral Daily   melatonin  2.5 mg Oral QHS   memantine  10 mg Oral BID   pantoprazole  40 mg Oral BID   polyethylene glycol  17 g Oral Daily   senna  2 tablet Oral Daily   traZODone  50 mg Oral QHS   Continuous Infusions: PRN Meds:.acetaminophen  **OR** acetaminophen (TYLENOL) oral liquid 160 mg/5 mL **OR** acetaminophen, ondansetron (ZOFRAN) IV, senna-docusate, sodium phosphate, traZODone Allergies  Allergen Reactions   Demerol [Meperidine] Nausea And Vomiting   Macrobid [Nitrofurantoin Macrocrystal] Rash   Penicillins Rash    Has patient had a PCN reaction causing immediate rash, facial/tongue/throat swelling, SOB or lightheadedness with hypotension: Unknown Has patient had a PCN reaction causing severe rash involving mucus membranes or skin necrosis: Unknown Has patient had a PCN reaction that required hospitalization: Unknown Has patient had a PCN reaction occurring within the last 10 years: No If all of the above answers are "NO", then may proceed with Cephalosporin use.    Review of Systems  Unable to perform ROS: Dementia    Physical Exam Vitals and nursing note reviewed.  Constitutional:      General: She is not in acute distress.    Appearance: She is cachectic. She is ill-appearing.     Comments: Poor intake  Cardiovascular:     Rate and Rhythm: Bradycardia present.  Pulmonary:     Effort: No tachypnea, accessory muscle usage or respiratory distress.     Comments: Shallow Abdominal:     General: Abdomen is flat.     Palpations: Abdomen is soft.  Genitourinary:    Comments: No urine output since yesterday am?? Neurological:     Mental Status: She is lethargic.     Comments: Withdraws to pain; does not open eyes, non-verbal at baseline     Vital Signs: BP (!) 104/53 (BP Location: Left Arm)   Pulse (!) 57   Temp (!) 97.1 F (36.2 C)   Resp 16   Ht $R'5\' 2"'IK$  (1.575 m)   Wt 48.6 kg   SpO2 98%   BMI 19.60 kg/m  Pain Scale: PAINAD POSS *See Group Information*: S-Acceptable,Sleep, easy to arouse     SpO2: SpO2: 98 % O2 Device:SpO2: 98 % O2 Flow Rate: .   IO: Intake/output summary:  Intake/Output Summary (Last 24 hours) at 01/13/2022 0820 Last data filed  at 01/12/2022 2052 Gross per 24 hour  Intake 120  ml  Output 1 ml  Net 119 ml    LBM: Last BM Date : 01/12/22 Baseline Weight: Weight: 48.6 kg Most recent weight: Weight: 48.6 kg     Palliative Assessment/Data:     Time In: 0915  Time Total: 80 min  Greater than 50%  of this time was spent counseling and coordinating care related to the above assessment and plan.  Signed by: Vinie Sill, NP Palliative Medicine Team Pager # [phone] (M-F 8a-5p) Team Phone # [phone] (Nights/Weekends)

## 2022-01-13 NOTE — Progress Notes (Signed)
PROGRESS NOTE    Jill Hancock  RXV:[id number] DOB: 01-08-1941 DOA: 01/11/2022 PCP: Sherol Dade, DO    Brief Narrative:  81 y.o. Caucasian female with medical history significant for anxiety, depression, hypertension, and CVA with residual left-sided weakness, who presented to the emergency room with acute onset of altered mental status and suspected worsening left-sided weakness.  According to her daughter she was drooling from her mouth and slumped over.  She was not interacting to her and she had to shake her.  SNF staff reported fever of 101 however the patient was afebrile here.  No reported chest pain or dyspnea or cough or wheezing.  No reported nausea or vomiting or diarrhea.  She had a recent fall and small subdural hemorrhage for which she was seen here and discharged on the ER.  7/13: Neurology engaged.  Case discussed with Dr. Otelia Limes.  Appearance of lesion read as acute/subacute CVA is unclear.  Possibly related to history of falls.  Lengthy discussion with patient's multiple family members at bedside.  Patient lives at [facility] and has suffered 5 falls in the last 2 weeks.  Family was requesting evaluation for placement and alternative living situation.  7/14: Palliative care engaged for evaluation and recommendations    Assessment & Plan:   Principal Problem:   Stroke (cerebrum) (HCC) Active Problems:   Hypertension   GERD (gastroesophageal reflux disease)   Essential hypertension   Dyslipidemia   Depression   Intracranial bleed (HCC)  Possible acute/subacute CVA versus contusion Patient presented after frequent falls.  Head CT showed possible CVA.  MRI read as acute/subacute CVA.  Discussed with neurology.  Possibly related to recent trauma.  Per family patient has had 5 falls in the last 2 weeks. Neurology engaged for comment 7/14.  Agree that presentation is a not necessarily consistent with acute CVA but rather manifestation of multiple falls and  known subdural hematoma Plan: Continue diet as tolerated Therapy evaluations as tolerated No anticoagulation or antiplatelet agents at this time [facility] consult for placement Palliative care consult  Depression PTA Prozac  Hyperlipidemia Statin  Essential hypertension Hold home lisinopril Continue amlodipine 10 mg nightly  GERD PPI  Frailty, debility, functional decline Patient is non verbal at baseline, frail, likely malnourished Palliative care consulted.  Case discussed with Yong Channel, NP    DVT prophylaxis: SCD Code Status: DNR Family Communication: Multiple family members at bedside Disposition Plan: Status is: Inpatient Remains inpatient appropriate because: Frailty, functional decline, recent falls, subdural hematoma.  Needs placement in alternative skilled nursing facility versus long-term care versus memory unit     Level of care: Telemetry Medical  Consultants:  Neurology  Procedures:  None  Antimicrobials: None   Subjective: Seen and examined.  Verbally unresponsive.  Does not follow commands.  Objective: Vitals:   01/12/22 2200 01/13/22 0049 01/13/22 0335 01/13/22 0757  BP: (!) 141/76 107/68 (!) 101/56 (!) 104/53  Pulse: 78 67 (!) 56 (!) 57  Resp: 16 18 19 16   Temp: 98.2 F (36.8 C) 97.7 F (36.5 C)  (!) 97.1 F (36.2 C)  TempSrc: Oral     SpO2: 98% 96% 97% 98%  Weight:      Height:        Intake/Output Summary (Last 24 hours) at 01/13/2022 1159 Last data filed at 01/12/2022 2052 Gross per 24 hour  Intake --  Output 1 ml  Net -1 ml   Filed Weights   01/12/22 0354  Weight: 48.6 kg  Examination:  General exam: NAD.  Verbally unresponsive.  Frail-appearing Respiratory system: Bibasilar crackles.  Normal work of breathing.  Room air Cardiovascular system: S1-S2, RRR, no murmurs, no pedal edema Gastrointestinal system: Soft, NT/ND, normal bowel sounds Central nervous system: Closed eyes.  Oriented x0.  Does not answer  questions Extremities: Unable to assess power.  Muscle wasting noted bilaterally Skin: Periorbital left-sided ecchymoses Psychiatry: Unable to assess    Data Reviewed: I have personally reviewed following labs and imaging studies  CBC: Recent Labs  Lab 01/09/22 1759 01/11/22 1635  WBC 10.2 9.6  NEUTROABS 7.7 6.6  HGB 14.3 12.7  HCT 43.0 38.8  MCV 90.9 91.7  PLT 307 295   Basic Metabolic Panel: Recent Labs  Lab 01/09/22 1759 01/11/22 1635  NA 139 144  K 3.7 3.6  CL 99 104  CO2 27 29  GLUCOSE 123* 108*  BUN 26* 24*  CREATININE 1.37* 0.84  CALCIUM 9.3 9.2   GFR: Estimated Creatinine Clearance: 40.3 mL/min (by C-G formula based on SCr of 0.84 mg/dL). Liver Function Tests: Recent Labs  Lab 01/09/22 1759  AST 37  ALT 25  ALKPHOS 51  BILITOT 0.8  PROT 7.4  ALBUMIN 3.9   No results for input(s): "LIPASE", "AMYLASE" in the last 168 hours. No results for input(s): "AMMONIA" in the last 168 hours. Coagulation Profile: No results for input(s): "INR", "PROTIME" in the last 168 hours. Cardiac Enzymes: Recent Labs  Lab 01/09/22 1759 01/11/22 1635  CKTOTAL 397* 219   BNP (last 3 results) No results for input(s): "PROBNP" in the last 8760 hours. HbA1C: Recent Labs    01/11/22 1635  HGBA1C 5.2   CBG: No results for input(s): "GLUCAP" in the last 168 hours. Lipid Profile: Recent Labs    01/12/22 0340  CHOL 121  HDL 59  LDLCALC 47  TRIG 76  CHOLHDL 2.1   Thyroid Function Tests: No results for input(s): "TSH", "T4TOTAL", "FREET4", "T3FREE", "THYROIDAB" in the last 72 hours. Anemia Panel: No results for input(s): "VITAMINB12", "FOLATE", "FERRITIN", "TIBC", "IRON", "RETICCTPCT" in the last 72 hours. Sepsis Labs: Recent Labs  Lab 01/11/22 1634 01/12/22 0340  PROCALCITON <0.10 <0.10    Recent Results (from the past 240 hour(s))  SARS Coronavirus 2 by RT PCR (hospital order, performed in [facility] hospital lab) *cepheid single result test* Anterior  Nasal Swab     Status: None   Collection Time: 01/12/22  3:40 AM   Specimen: Anterior Nasal Swab  Result Value Ref Range Status   SARS Coronavirus 2 by RT PCR NEGATIVE NEGATIVE Final    Comment: (NOTE) SARS-CoV-2 target nucleic acids are NOT DETECTED.  The SARS-CoV-2 RNA is generally detectable in upper and lower respiratory specimens during the acute phase of infection. The lowest concentration of SARS-CoV-2 viral copies this assay can detect is 250 copies / mL. A negative result does not preclude SARS-CoV-2 infection and should not be used as the sole basis for treatment or other patient management decisions.  A negative result may occur with improper specimen collection / handling, submission of specimen other than nasopharyngeal swab, presence of viral mutation(s) within the areas targeted by this assay, and inadequate number of viral copies (<250 copies / mL). A negative result must be combined with clinical observations, patient history, and epidemiological information.  Fact Sheet for Patients:   RoadLapTop.co.za  Fact Sheet for Healthcare Providers: http://kim-miller.com/  This test is not yet approved or  cleared by the [place] and has been authorized  for detection and/or diagnosis of SARS-CoV-2 by FDA under an Emergency Use Authorization (EUA).  This EUA will remain in effect (meaning this test can be used) for the duration of the COVID-19 declaration under Section 564(b)(1) of the Act, 21 U.S.C. section 360bbb-3(b)(1), unless the authorization is terminated or revoked sooner.  Performed at [facility], [address], [place], [place] [address]          Radiology Studies: CT ANGIO HEAD NECK W WO CM  Result Date: 01/11/2022 CLINICAL DATA:  Follow-up examination for stroke. EXAM: CT ANGIOGRAPHY HEAD AND NECK TECHNIQUE: Multidetector CT imaging of the head and neck was performed using the standard  protocol during bolus administration of intravenous contrast. Multiplanar CT image reconstructions and MIPs were obtained to evaluate the vascular anatomy. Carotid stenosis measurements (when applicable) are obtained utilizing NASCET criteria, using the distal internal carotid diameter as the denominator. RADIATION DOSE REDUCTION: This exam was performed according to the departmental dose-optimization program which includes automated exposure control, adjustment of the mA and/or kV according to patient size and/or use of iterative reconstruction technique. CONTRAST:  19mL OMNIPAQUE IOHEXOL 350 MG/ML SOLN COMPARISON:  Prior MRI and CT from earlier the same day. FINDINGS: CTA NECK FINDINGS Aortic arch: Visualized aortic arch normal caliber with standard 3 vessel morphology. Mild atheromatous change about the arch itself. No stenosis about the origin the great vessels. Right carotid system: Right common and internal carotid arteries patent without stenosis or dissection. Minor atheromatous change about the right carotid bulb without stenosis. Left carotid system: Left common and internal carotid arteries patent without stenosis or dissection. Minor atheromatous change about the left carotid bulb without stenosis. Vertebral arteries: Both vertebral arteries arise from subclavian arteries. Right vertebral artery strongly dominant, with a diffusely hypoplastic left vertebral artery. Left vertebral artery patent within the neck, but essentially occludes as it courses into the cranial vault. Focal moderate proximal right V2 stenosis due to extrinsic compression from uncovertebral and facet disease at the level of C5 noted (series 6, image 103). Otherwise, dominant right vertebral artery patent without stenosis or dissection. Skeleton: No discrete or worrisome osseous lesions. Cervical spine better evaluated on recent CT of the cervical spine. Other neck: No other acute soft tissue abnormality within the neck. Upper chest:  Visualized upper chest demonstrates no other acute findings. Review of the MIP images confirms the above findings CTA HEAD FINDINGS Anterior circulation: Petrous segments patent bilaterally. Atheromatous change seen within the carotid siphons with associated mild multifocal narrowing. A1 segments patent. Normal anterior communicating artery complex. Anterior cerebral arteries patent without stenosis. No M1 stenosis or occlusion. Right M1 bifurcates early. No proximal MCA branch occlusion. Distal MCA branches perfused and symmetric. Posterior circulation: Dominant right V4 segment widely patent. Right PICA patent. Hypoplastic left vertebral artery largely occluded at the skull base. Basilar patent to its distal aspect without stenosis. Superior cerebellar arteries patent bilaterally. Predominant fetal type origin of the PCAs, both of which are widely patent to their distal aspects. Venous sinuses: Grossly patent allowing for timing the contrast bolus. Anatomic variants: As above.  No aneurysm. Review of the MIP images confirms the above findings IMPRESSION: 1. Negative CTA for acute large vessel occlusion or other emergent finding. 2. 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 Focal moderate proximal right V2 stenosis due to extrinsic compression from uncovertebral and facet disease at the level of C5. Dominant right vertebral artery otherwise widely patent. 3. Mild atheromatous disease elsewhere about the major arterial  vasculature of the head and neck. No other hemodynamically significant or correctable stenosis. Electronically Signed   By: Jeannine Boga M.D.   On: 01/11/2022 21:56   MR BRAIN WO CONTRAST  Result Date: 01/11/2022 CLINICAL DATA:  Stroke, follow up. EXAM: MRI HEAD WITHOUT CONTRAST TECHNIQUE: Multiplanar, multiecho pulse sequences of the brain and surrounding structures were obtained without intravenous contrast. COMPARISON:  Head CT January 11, 2022; MRI of the  brain January 12, 2019 FINDINGS: Brain: Cortical focus of restricted diffusion in the right parietal lobe with corresponding increased signal on T2, suggestive of a small acute/subacute infarct. Thin right convexity subdural fluid collection with associated susceptibility artifact, measuring up to 2 mm without significant mass effect on the brain parenchyma. No hydrocephalus or mass lesion. Extensive supratentorial and infratentorial chronic white matter disease multiple small remote infarcts within the bilateral centrum semiovale, corona radiata, basal ganglia, thalami, pons and cerebellar hemispheres. Old cortical infarcts are also noted in the bilateral frontoparietal lobes. Innumerable foci of susceptibility artifact scattered throughout the brain parenchyma with central predominance. Moderate to advanced parenchymal volume loss. Vascular: Normal flow voids. Skull and upper cervical spine: Normal marrow signal. Sinuses/Orbits: Negative. Other: Bilateral frontal and right parietal scalp edema. IMPRESSION: 1. Focus of acute/subacute cortical infarct in the right parietal lobe. 2. Thin right cerebral convexity subdural hemorrhage measuring up to 2 mm without significant mass effect on the brain parenchyma. 3. 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 Electronically Signed   By: Pedro Earls M.D.   On: 01/11/2022 20:10   CT Cervical Spine Wo Contrast  Result Date: 01/11/2022 CLINICAL DATA:  Neck trauma. EXAM: CT CERVICAL SPINE WITHOUT CONTRAST TECHNIQUE: Multidetector CT imaging of the cervical spine was performed without intravenous contrast. Multiplanar CT image reconstructions were also generated. RADIATION DOSE REDUCTION: This exam was performed according to the departmental dose-optimization program which includes automated exposure control, adjustment of the mA and/or kV according to patient size  and/or use of iterative reconstruction technique. COMPARISON:  None Available. FINDINGS: Alignment: Normal. Skull base and vertebrae: No acute fracture. No primary bone lesion or focal pathologic process. Soft tissues and spinal canal: No prevertebral fluid or swelling. No visible canal hematoma. Disc levels: Multilevel osteoarthritic changes of the cervical spine. Upper chest: Negative. Other: None. IMPRESSION: 1. No acute fracture or subluxation of the cervical spine. 2. Multilevel osteoarthritic changes of the cervical spine. Electronically Signed   By: Fidela Salisbury M.D.   On: 01/11/2022 16:05   DG Pelvis Portable  Result Date: 01/11/2022 CLINICAL DATA:  Unwitnessed fall EXAM: PORTABLE PELVIS 1-2 VIEWS COMPARISON:  None Available. FINDINGS: LEFT femur internal fixation. No acute fracture dislocation in the pelvis or hips. IMPRESSION: No acute osseous abnormality. Electronically Signed   By: Suzy Bouchard M.D.   On: 01/11/2022 16:03   CT HEAD WO CONTRAST (5MM)  Result Date: 01/11/2022 CLINICAL DATA:  Head trauma. EXAM: CT HEAD WITHOUT CONTRAST TECHNIQUE: Contiguous axial images were obtained from the base of the skull through the vertex without intravenous contrast. RADIATION DOSE REDUCTION: This exam was performed according to the departmental dose-optimization program which includes automated exposure control, adjustment of the mA and/or kV according to patient size and/or use of iterative reconstruction technique. COMPARISON:  January 10, 2022 FINDINGS: Brain: No evidence of acute infarction, hemorrhage, hydrocephalus, extra-axial collection or mass lesion/mass effect. Marked brain parenchymal volume loss and deep white matter microangiopathy. Again seen is a minimal subdural  hematoma overlying the right frontal and temporal lobes. Vascular: No hyperdense vessel or unexpected calcification. Skull: Normal. Negative for fracture or focal lesion. Sinuses/Orbits: No acute finding. Other: Right  frontoparietal scalp hematoma. IMPRESSION: 1. Minimal subdural hematoma overlying the right frontal and temporal lobes. 2. Marked brain parenchymal volume loss and deep white matter microangiopathy. 3. Right frontoparietal scalp hematoma. Electronically Signed   By: Fidela Salisbury M.D.   On: 01/11/2022 16:02   DG Chest Portable 1 View  Result Date: 01/11/2022 CLINICAL DATA:  Pt to ED via [facility] from [facility] for an unwitnessed fall this afternoon. Per Staff from [facility], CNA was in the room helping pt eat, CNA went to take tray out of the room and came back in to find pt in the floor. Pt seen.*comment was truncated*fall EXAM: PORTABLE CHEST 1 VIEW COMPARISON:  01/09/2022 FINDINGS: Normal cardiac silhouette. LEFT basilar opacification. Pulmonary edema. No pleural fluid. No acute osseous abnormality. IMPRESSION: LEFT lower lobe infiltrate versus atelectasis. Electronically Signed   By: Suzy Bouchard M.D.   On: 01/11/2022 16:02        Scheduled Meds:   stroke: early stages of recovery book   Does not apply Once   amLODipine  10 mg Oral QHS   atorvastatin  20 mg Oral QHS   cholecalciferol  2,000 Units Oral Daily   divalproex  125 mg Oral Daily   docusate  100 mg Oral Daily   FLUoxetine  20 mg Oral Daily   lisinopril  40 mg Oral QHS   magnesium oxide  600 mg Oral Daily   melatonin  2.5 mg Oral QHS   memantine  10 mg Oral BID   pantoprazole  40 mg Oral BID   polyethylene glycol  17 g Oral Daily   senna  2 tablet Oral Daily   traZODone  50 mg Oral QHS   Continuous Infusions:   LOS: 1 day       Sidney Ace, MD [facility]   If 7PM-7AM, please contact night-coverage  01/13/2022, 11:59 AM

## 2022-01-13 NOTE — TOC Progression Note (Signed)
Transition of Care [facility]) - Progression Note    Patient Details  Name: Jill Hancock MRN: [id number] Date of Birth: 20-Aug-1940  Transition of Care [facility]) CM/SW Contact  Maree Krabbe, LCSW Phone Number: 01/13/2022, 12:09 PM  Clinical Narrative:   CSW spoke with [name]. Provided only bed offer. CSW explained [facility] is full in [place] but has sister facilities in [place] and [place]. Per pt's daughter permission the rep at [facility] will reach out to the sister facilities to see if they have any beds. Misty Stanley also wants to talk to pt's other daughter to discuss plan. CSW will follow up with pt when bed availability at memory care is determined.    Expected Discharge Plan: Skilled Nursing Facility Barriers to Discharge: Continued Medical Work up  Expected Discharge Plan and Services Expected Discharge Plan: Skilled Nursing Facility In-house Referral: NA   Post Acute Care Choice: Skilled Nursing Facility Living arrangements for the past 2 months: Skilled Nursing Facility                                       Social Determinants of Health (SDOH) Interventions    Readmission Risk Interventions     No data to display

## 2022-01-14 DIAGNOSIS — I629 Nontraumatic intracranial hemorrhage, unspecified: Secondary | ICD-10-CM | POA: Diagnosis not present

## 2022-01-14 LAB — BASIC METABOLIC PANEL
Anion gap: 4 — ABNORMAL LOW (ref 5–15)
BUN: 17 mg/dL (ref 8–23)
CO2: 28 mmol/L (ref 22–32)
Calcium: 8.5 mg/dL — ABNORMAL LOW (ref 8.9–10.3)
Chloride: 107 mmol/L (ref 98–111)
Creatinine, Ser: 0.75 mg/dL (ref 0.44–1.00)
GFR, Estimated: >60 mL/min (ref >60)
Glucose, Bld: 103 mg/dL — ABNORMAL HIGH (ref 70–99)
Potassium: 3.4 mmol/L — ABNORMAL LOW (ref 3.5–5.1)
Sodium: 139 mmol/L (ref 135–145)

## 2022-01-14 NOTE — Progress Notes (Signed)
[facility]) Hospital Liaison Note  Received request from [facility], Dominica Severin, for family interest in [place]. Met with Cecille Rubin, daughter of patient, and other family members to confirm interest and explain services. Family agreeable to transfer today. TOC and MD manager aware. Eligibility confirmed. Pending consents being completed, will set up transport.   RN please call report to [phone], prior to patient leaving the unit.   Please send signed and completed DNR with patient at discharge.   Thank you,  Bea Laura, MSN RN [facility] Liaison [phone]

## 2022-01-14 NOTE — Progress Notes (Signed)
Pt sent to [facility] via EMS. IV left in place per RN @ Hospice facility. Pt placed in transfer gown. All belongings sent with pt.

## 2022-01-14 NOTE — Progress Notes (Signed)
PROGRESS NOTE    Jill Hancock  NLG:[id number] DOB: 1941/03/27 DOA: 01/11/2022 PCP: Sherol Dade, DO    Brief Narrative:  81 y.o. Caucasian female with medical history significant for anxiety, depression, hypertension, and CVA with residual left-sided weakness, who presented to the emergency room with acute onset of altered mental status and suspected worsening left-sided weakness.  According to her daughter she was drooling from her mouth and slumped over.  She was not interacting to her and she had to shake her.  SNF staff reported fever of 101 however the patient was afebrile here.  No reported chest pain or dyspnea or cough or wheezing.  No reported nausea or vomiting or diarrhea.  She had a recent fall and small subdural hemorrhage for which she was seen here and discharged on the ER.  7/13: Neurology engaged.  Case discussed with Dr. Otelia Limes.  Appearance of lesion read as acute/subacute CVA is unclear.  Possibly related to history of falls.  Lengthy discussion with patient's multiple family members at bedside.  Patient lives at [facility] and has suffered 5 falls in the last 2 weeks.  Family was requesting evaluation for placement and alternative living situation.  7/14: Palliative care engaged for evaluation and recommendations 7/15: No clinical status changes.  Kidney function remains normal.  Electrolytes within normal limits.  Repeat head CT performed 7/14 showed no expansion of known subdural    Assessment & Plan:   Principal Problem:   Stroke (cerebrum) (HCC) Active Problems:   Hypertension   GERD (gastroesophageal reflux disease)   Essential hypertension   Dyslipidemia   Depression   Intracranial bleed (HCC)  Possible acute/subacute CVA versus contusion Patient presented after frequent falls.  Head CT showed possible CVA.  MRI read as acute/subacute CVA.  Discussed with neurology.  Possibly related to recent trauma.  Per family patient has had 5 falls in  the last 2 weeks. Neurology engaged for comment 7/14.  Agree that presentation is a not necessarily consistent with acute CVA but rather manifestation of multiple falls and known subdural hematoma -Palliative care engaged on 7/14 -Repeat head CT 7/14 showed stable bleed, no expansion or mass effect Plan: Continue diet as tolerated No anticoagulation or antiplatelet agents at this time We will need placement, TOC engaged Palliative care consulted, recommendations appreciated  Depression PTA Prozac  Hyperlipidemia Statin  Essential hypertension Hold home lisinopril Continue amlodipine 10 mg nightly  GERD PPI  Frailty, debility, functional decline Patient is non verbal at baseline, frail, likely malnourished Palliative care consulted.  Case discussed with Yong Channel, NP    DVT prophylaxis: SCD Code Status: DNR Family Communication: Multiple family members at bedside 7/13 Disposition Plan: Status is: Inpatient Remains inpatient appropriate because: Frailty, functional decline, recent falls, subdural hematoma.  Needs placement in alternative skilled nursing facility versus long-term care versus memory unit     Level of care: Med-Surg  Consultants:  Neurology  Procedures:  None  Antimicrobials: None   Subjective: Seen and examined.  Verbally unresponsive.  Does not follow commands.  Objective: Vitals:   01/14/22 0036 01/14/22 0445 01/14/22 0613 01/14/22 0821  BP: 113/70 116/73 101/60 (!) 101/53  Pulse: 73 75 66 (!) 59  Resp: 18 18 14 18   Temp: 97.9 F (36.6 C) 98 F (36.7 C) 98.9 F (37.2 C) 98.6 F (37 C)  TempSrc:    Axillary  SpO2: 96% 95% 94% 97%  Weight:      Height:  Intake/Output Summary (Last 24 hours) at 01/14/2022 1137 Last data filed at 01/14/2022 0500 Gross per 24 hour  Intake 360 ml  Output 201 ml  Net 159 ml   Filed Weights   01/12/22 0354  Weight: 48.6 kg    Examination:  General exam: Frail.  Verbally  unresponsive. Respiratory system: Bibasilar crackles.  Normal work of breathing.  Room air Cardiovascular system: S1-S2, RRR, no murmurs, no pedal edema Gastrointestinal system: Soft, NT/ND, normal bowel sounds Central nervous system: Closed eyes.  Oriented x0.  Does not answer questions Extremities: Unable to assess power.  Diffuse muscle wasting noted Skin: Periorbital left-sided ecchymoses Psychiatry: Unable to assess    Data Reviewed: I have personally reviewed following labs and imaging studies  CBC: Recent Labs  Lab 01/09/22 1759 01/11/22 1635  WBC 10.2 9.6  NEUTROABS 7.7 6.6  HGB 14.3 12.7  HCT 43.0 38.8  MCV 90.9 91.7  PLT 307 295   Basic Metabolic Panel: Recent Labs  Lab 01/09/22 1759 01/11/22 1635 01/13/22 1809 01/14/22 0641  NA 139 144 141 139  K 3.7 3.6 3.6 3.4*  CL 99 104 100 107  CO2 27 29 25 28   GLUCOSE 123* 108* 135* 103*  BUN 26* 24* 19 17  CREATININE 1.37* 0.84 0.88 0.75  CALCIUM 9.3 9.2 9.2 8.5*   GFR: Estimated Creatinine Clearance: 42.3 mL/min (by C-G formula based on SCr of 0.75 mg/dL). Liver Function Tests: Recent Labs  Lab 01/09/22 1759  AST 37  ALT 25  ALKPHOS 51  BILITOT 0.8  PROT 7.4  ALBUMIN 3.9   No results for input(s): "LIPASE", "AMYLASE" in the last 168 hours. No results for input(s): "AMMONIA" in the last 168 hours. Coagulation Profile: No results for input(s): "INR", "PROTIME" in the last 168 hours. Cardiac Enzymes: Recent Labs  Lab 01/09/22 1759 01/11/22 1635  CKTOTAL 397* 219   BNP (last 3 results) No results for input(s): "PROBNP" in the last 8760 hours. HbA1C: Recent Labs    01/11/22 1635  HGBA1C 5.2   CBG: No results for input(s): "GLUCAP" in the last 168 hours. Lipid Profile: Recent Labs    01/12/22 0340  CHOL 121  HDL 59  LDLCALC 47  TRIG 76  CHOLHDL 2.1   Thyroid Function Tests: No results for input(s): "TSH", "T4TOTAL", "FREET4", "T3FREE", "THYROIDAB" in the last 72 hours. Anemia  Panel: No results for input(s): "VITAMINB12", "FOLATE", "FERRITIN", "TIBC", "IRON", "RETICCTPCT" in the last 72 hours. Sepsis Labs: Recent Labs  Lab 01/11/22 1634 01/12/22 0340  PROCALCITON <0.10 <0.10    Recent Results (from the past 240 hour(s))  SARS Coronavirus 2 by RT PCR (hospital order, performed in [facility] hospital lab) *cepheid single result test* Anterior Nasal Swab     Status: None   Collection Time: 01/12/22  3:40 AM   Specimen: Anterior Nasal Swab  Result Value Ref Range Status   SARS Coronavirus 2 by RT PCR NEGATIVE NEGATIVE Final    Comment: (NOTE) SARS-CoV-2 target nucleic acids are NOT DETECTED.  The SARS-CoV-2 RNA is generally detectable in upper and lower respiratory specimens during the acute phase of infection. The lowest concentration of SARS-CoV-2 viral copies this assay can detect is 250 copies / mL. A negative result does not preclude SARS-CoV-2 infection and should not be used as the sole basis for treatment or other patient management decisions.  A negative result may occur with improper specimen collection / handling, submission of specimen other than nasopharyngeal swab, presence of viral mutation(s) within the  areas targeted by this assay, and inadequate number of viral copies (<250 copies / mL). A negative result must be combined with clinical observations, patient history, and epidemiological information.  Fact Sheet for Patients:   RoadLapTop.co.za  Fact Sheet for Healthcare Providers: http://kim-miller.com/  This test is not yet approved or  cleared by the [place] FDA and has been authorized for detection and/or diagnosis of SARS-CoV-2 by FDA under an Emergency Use Authorization (EUA).  This EUA will remain in effect (meaning this test can be used) for the duration of the COVID-19 declaration under Section 564(b)(1) of the Act, 21 U.S.C. section 360bbb-3(b)(1), unless the authorization is  terminated or revoked sooner.  Performed at [facility], [address], [place], [place] [address]          Radiology Studies: CT HEAD WO CONTRAST ( )  Result Date: 01/13/2022 CLINICAL DATA:  Initial evaluation for mental status change, unknown cause. EXAM: CT HEAD WITHOUT CONTRAST TECHNIQUE: Contiguous axial images were obtained from the base of the skull through the vertex without intravenous contrast. RADIATION DOSE REDUCTION: This exam was performed according to the departmental dose-optimization program which includes automated exposure control, adjustment of the mA and/or kV according to patient size and/or use of iterative reconstruction technique. COMPARISON:  Prior CTA from 01/11/2022 as well as multiple previous studies. FINDINGS: Brain: Small subdural hemorrhage overlying the right cerebral convexity again seen, most pronounced along the posterior falx and tentorium. This measures 2 mm in maximal thickness without mass effect. Underlying advanced atrophy with chronic small vessel ischemic disease with a few scattered remote ischemic infarcts. No new intracranial hemorrhage. No visible acute large vessel territory infarct. Previously identified small right parietal infarct not visible by CT. No mass lesion or midline shift. Diffuse ventricular prominence related to global parenchymal volume loss of hydrocephalus. Vascular: No abnormal hyperdense vessel. Scattered vascular calcifications noted within the carotid siphons. Skull: Evolving soft tissue contusion at the right frontal scalp. Calvarium intact. Sinuses/Orbits: Globes and orbital soft tissues demonstrate no acute finding. Paranasal sinuses and mastoid air cells remain largely clear. Other: None. IMPRESSION: 1. Persistent small subdural hemorrhage overlying the right cerebral convexity without significant mass effect. 2. No other new acute intracranial abnormality. 3. Underlying advanced cerebral atrophy with chronic  small vessel ischemic disease. 4. Evolving right frontal scalp contusion. Electronically Signed   By: Rise Mu M.D.   On: 01/13/2022 19:20        Scheduled Meds:   stroke: early stages of recovery book   Does not apply Once   acetaminophen  650 mg Oral Q8H   amLODipine  10 mg Oral QHS   atorvastatin  20 mg Oral QHS   cholecalciferol  2,000 Units Oral Daily   divalproex  125 mg Oral Daily   docusate  100 mg Oral Daily   FLUoxetine  20 mg Oral Daily   magnesium oxide  600 mg Oral Daily   melatonin  2.5 mg Oral QHS   memantine  10 mg Oral BID   pantoprazole  40 mg Oral BID   polyethylene glycol  17 g Oral Daily   senna  2 tablet Oral Daily   traZODone  50 mg Oral QHS   Continuous Infusions:  sodium chloride 75 mL/hr at 01/14/22 0550     LOS: 2 days       Tresa [name], MD [facility]   If 7PM-7AM, please contact night-coverage  01/14/2022, 11:37 AM

## 2022-01-14 NOTE — TOC Progression Note (Addendum)
Transition of Care [facility]) - Progression Note    Patient Details  Name: Jill Hancock MRN: [id number] Date of Birth: [birth year]  Transition of Care [facility]) CM/SW Contact  Bing Quarry, RN Phone Number: 01/14/2022, 12:01 PM  Clinical Narrative:  7/15: Per provider request and daughter agreeable, referral made to weekend hospice liaison. Gabriel Cirri RN CM   UPDATE: 5 pm: Patient accepted to hospice facility being arranged via liaison. DC Summary in. Pending all consents. Gabriel Cirri RN CM   Expected Discharge Plan: Skilled Nursing Facility Barriers to Discharge: Continued Medical Work up  Expected Discharge Plan and Services Expected Discharge Plan: Skilled Nursing Facility In-house Referral: NA   Post Acute Care Choice: Skilled Nursing Facility Living arrangements for the past 2 months: Skilled Nursing Facility                                       Social Determinants of Health (SDOH) Interventions    Readmission Risk Interventions     No data to display

## 2022-01-14 NOTE — TOC Transition Note (Signed)
Transition of Care [facility]) - CM/SW Discharge Note   Patient Details  Name: Jill GADBOIS MRN: [id number] Date of Birth: 1940/07/31  Transition of Care [facility]) CM/SW Contact:  Bing Quarry, RN Phone Number: 01/14/2022, 5:31 PM   Clinical Narrative: 7/15:533 pm. Patient referred to hospice liaison, was accepted to hospice facility, and will be transported there today via arrangements made by [facility] hospice liaison.  Gabriel Cirri RN CM      Final next level of care: Hospice Medical Facility Barriers to Discharge: Barriers Resolved   Patient Goals and CMS Choice Patient states their goals for this hospitalization and ongoing recovery are:: for pt to go to different snf   Choice offered to / list presented to : Adult Children  Discharge Placement                Patient to be transferred to facility by: Per hopsice arrangements      Discharge Plan and Services In-house Referral: NA   Post Acute Care Choice: Skilled Nursing Facility          DME Arranged: N/A DME Agency: NA       HH Arranged: NA HH Agency: NA        Social Determinants of Health (SDOH) Interventions     Readmission Risk Interventions     No data to display

## 2022-01-14 NOTE — Discharge Summary (Signed)
Physician Discharge Summary  Jill Hancock EXH:[id number] DOB: 1941-04-23 DOA: 01/11/2022  PCP: Hal Morales, DO  Admit date: 01/11/2022 Discharge date: 01/14/2022  Admitted From: SNF Disposition: Inpatient hospice facility   Home Health: No Equipment/Devices: None  Discharge Condition: Hospice CODE STATUS: DNR Diet recommendation: Comfort  Brief/Interim Summary: 81 y.o. Caucasian female with medical history significant for anxiety, depression, hypertension, and CVA with residual left-sided weakness, who presented to the emergency room with acute onset of altered mental status and suspected worsening left-sided weakness.  According to her daughter she was drooling from her mouth and slumped over.  She was not interacting to her and she had to shake her.  SNF staff reported fever of 101 however the patient was afebrile here.  No reported chest pain or dyspnea or cough or wheezing.  No reported nausea or vomiting or diarrhea.  She had a recent fall and small subdural hemorrhage for which she was seen here and discharged on the ER.   7/13: Neurology engaged.  Case discussed with Dr. Cheral Marker.  Appearance of lesion read as acute/subacute CVA is unclear.  Possibly related to history of falls.  Lengthy discussion with patient's multiple family members at bedside.  Patient lives at [facility] and has suffered 5 falls in the last 2 weeks.  Family was requesting evaluation for placement and alternative living situation.   7/14: Palliative care engaged for evaluation and recommendations 7/15: No clinical status changes.  Kidney function remains normal.  Electrolytes within normal limits.  Repeat head CT performed 7/14 showed no expansion of known subdural  Discussion between attending MD and patient's daughter/MPOA.  Explained poor clinical situation.  Daughter understands.  Engage hospice liaison.  Hospice liaison met with patient and family at bedside.  Patient deemed appropriate for  disposition to inpatient hospice facility.    Discharge Diagnoses:  Principal Problem:   Stroke (cerebrum) ([place]) Active Problems:   Hypertension   GERD (gastroesophageal reflux disease)   Essential hypertension   Dyslipidemia   Depression   Intracranial bleed ([facility]) Possible acute/subacute CVA versus contusion Patient presented after frequent falls.  Head CT showed possible CVA.  MRI read as acute/subacute CVA.  Discussed with neurology.  Possibly related to recent trauma.  Per family patient has had 5 falls in the last 2 weeks. Neurology engaged for comment 7/14.  Agree that presentation is a not necessarily consistent with acute CVA but rather manifestation of multiple falls and known subdural hematoma -Palliative care engaged on 7/14 -Repeat head CT 7/14 showed stable bleed, no expansion or mass effect Plan: Discharge to inpatient hospice facility.  Medication deferred to hospice providers.  Will recommend continuation of Depakote and trazodone per home MAR to avoid any possible withdrawal symptoms.  Defer decision regarding continuation of these medications to outpatient hospice providers.  Patient DNR.   Discharge Instructions  Discharge Instructions     Diet - low sodium heart healthy   Complete by: As directed    Increase activity slowly   Complete by: As directed       Allergies as of 01/14/2022       Reactions   Demerol [meperidine] Nausea And Vomiting   Macrobid [nitrofurantoin Macrocrystal] Rash   Penicillins Rash   Has patient had a PCN reaction causing immediate rash, facial/tongue/throat swelling, SOB or lightheadedness with hypotension: Unknown Has patient had a PCN reaction causing severe rash involving mucus membranes or skin necrosis: Unknown Has patient had a PCN reaction that required hospitalization: Unknown Has patient  had a PCN reaction occurring within the last 10 years: No If all of the above answers are "NO", then may proceed with Cephalosporin use.         Medication List     STOP taking these medications    acetaminophen 325 MG tablet Commonly known as: TYLENOL   alendronate 70 MG tablet Commonly known as: FOSAMAX   amLODipine 10 MG tablet Commonly known as: NORVASC   atorvastatin 40 MG tablet Commonly known as: LIPITOR   CertaVite/Antioxidants Tabs   docusate 50 MG/5ML liquid Commonly known as: COLACE   FLUoxetine 20 MG capsule Commonly known as: PROZAC   lisinopril 40 MG tablet Commonly known as: ZESTRIL   magnesium oxide 400 MG tablet Commonly known as: MAG-OX   melatonin 3 MG Tabs tablet   memantine 10 MG tablet Commonly known as: NAMENDA   pantoprazole 40 MG tablet Commonly known as: PROTONIX   polyethylene glycol 17 g packet Commonly known as: MiraLax   senna 8.6 MG Tabs tablet Commonly known as: SENOKOT   sodium phosphate 7-19 GM/118ML Enem   traMADol 50 MG tablet Commonly known as: ULTRAM   trolamine salicylate 10 % cream Commonly known as: ASPERCREME   Vitamin D3 50 MCG (2000 UT) Tabs       TAKE these medications    divalproex 125 MG capsule Commonly known as: DEPAKOTE SPRINKLE 125 mg 2 (two) times daily.   traZODone 50 MG tablet Commonly known as: DESYREL Take 50 mg by mouth at bedtime.        Allergies  Allergen Reactions   Demerol [Meperidine] Nausea And Vomiting   Macrobid [Nitrofurantoin Macrocrystal] Rash   Penicillins Rash    Has patient had a PCN reaction causing immediate rash, facial/tongue/throat swelling, SOB or lightheadedness with hypotension: Unknown Has patient had a PCN reaction causing severe rash involving mucus membranes or skin necrosis: Unknown Has patient had a PCN reaction that required hospitalization: Unknown Has patient had a PCN reaction occurring within the last 10 years: No If all of the above answers are "NO", then may proceed with Cephalosporin use.     Consultations: Palliative care   Procedures/Studies: CT HEAD WO CONTRAST  (5MM)  Result Date: 01/13/2022 CLINICAL DATA:  Initial evaluation for mental status change, unknown cause. EXAM: CT HEAD WITHOUT CONTRAST TECHNIQUE: Contiguous axial images were obtained from the base of the skull through the vertex without intravenous contrast. RADIATION DOSE REDUCTION: This exam was performed according to the departmental dose-optimization program which includes automated exposure control, adjustment of the mA and/or kV according to patient size and/or use of iterative reconstruction technique. COMPARISON:  Prior CTA from 01/11/2022 as well as multiple previous studies. FINDINGS: Brain: Small subdural hemorrhage overlying the right cerebral convexity again seen, most pronounced along the posterior falx and tentorium. This measures 2 mm in maximal thickness without mass effect. Underlying advanced atrophy with chronic small vessel ischemic disease with a few scattered remote ischemic infarcts. No new intracranial hemorrhage. No visible acute large vessel territory infarct. Previously identified small right parietal infarct not visible by CT. No mass lesion or midline shift. Diffuse ventricular prominence related to global parenchymal volume loss of hydrocephalus. Vascular: No abnormal hyperdense vessel. Scattered vascular calcifications noted within the carotid siphons. Skull: Evolving soft tissue contusion at the right frontal scalp. Calvarium intact. Sinuses/Orbits: Globes and orbital soft tissues demonstrate no acute finding. Paranasal sinuses and mastoid air cells remain largely clear. Other: None. IMPRESSION: 1. Persistent small subdural hemorrhage overlying the right cerebral  convexity without significant mass effect. 2. No other new acute intracranial abnormality. 3. Underlying advanced cerebral atrophy with chronic small vessel ischemic disease. 4. Evolving right frontal scalp contusion. Electronically Signed   By: Jeannine Boga M.D.   On: 01/13/2022 19:20   CT ANGIO HEAD NECK W  WO CM  Result Date: 01/11/2022 CLINICAL DATA:  Follow-up examination for stroke. EXAM: CT ANGIOGRAPHY HEAD AND NECK TECHNIQUE: Multidetector CT imaging of the head and neck was performed using the standard protocol during bolus administration of intravenous contrast. Multiplanar CT image reconstructions and MIPs were obtained to evaluate the vascular anatomy. Carotid stenosis measurements (when applicable) are obtained utilizing NASCET criteria, using the distal internal carotid diameter as the denominator. RADIATION DOSE REDUCTION: This exam was performed according to the departmental dose-optimization program which includes automated exposure control, adjustment of the mA and/or kV according to patient size and/or use of iterative reconstruction technique. CONTRAST:  79mL OMNIPAQUE IOHEXOL 350 MG/ML SOLN COMPARISON:  Prior MRI and CT from earlier the same day. FINDINGS: CTA NECK FINDINGS Aortic arch: Visualized aortic arch normal caliber with standard 3 vessel morphology. Mild atheromatous change about the arch itself. No stenosis about the origin the great vessels. Right carotid system: Right common and internal carotid arteries patent without stenosis or dissection. Minor atheromatous change about the right carotid bulb without stenosis. Left carotid system: Left common and internal carotid arteries patent without stenosis or dissection. Minor atheromatous change about the left carotid bulb without stenosis. Vertebral arteries: Both vertebral arteries arise from subclavian arteries. Right vertebral artery strongly dominant, with a diffusely hypoplastic left vertebral artery. Left vertebral artery patent within the neck, but essentially occludes as it courses into the cranial vault. Focal moderate proximal right V2 stenosis due to extrinsic compression from uncovertebral and facet disease at the level of C5 noted (series 6, image 103). Otherwise, dominant right vertebral artery patent without stenosis or  dissection. Skeleton: No discrete or worrisome osseous lesions. Cervical spine better evaluated on recent CT of the cervical spine. Other neck: No other acute soft tissue abnormality within the neck. Upper chest: Visualized upper chest demonstrates no other acute findings. Review of the MIP images confirms the above findings CTA HEAD FINDINGS Anterior circulation: Petrous segments patent bilaterally. Atheromatous change seen within the carotid siphons with associated mild multifocal narrowing. A1 segments patent. Normal anterior communicating artery complex. Anterior cerebral arteries patent without stenosis. No M1 stenosis or occlusion. Right M1 bifurcates early. No proximal MCA branch occlusion. Distal MCA branches perfused and symmetric. Posterior circulation: Dominant right V4 segment widely patent. Right PICA patent. Hypoplastic left vertebral artery largely occluded at the skull base. Basilar patent to its distal aspect without stenosis. Superior cerebellar arteries patent bilaterally. Predominant fetal type origin of the PCAs, both of which are widely patent to their distal aspects. Venous sinuses: Grossly patent allowing for timing the contrast bolus. Anatomic variants: As above.  No aneurysm. Review of the MIP images confirms the above findings IMPRESSION: 1. Negative CTA for acute large vessel occlusion or other emergent finding. 2. 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 Focal moderate proximal right V2 stenosis due to extrinsic compression from uncovertebral and facet disease at the level of C5. Dominant right vertebral artery otherwise widely patent. 3. Mild atheromatous disease elsewhere about the major arterial vasculature of the head and neck. No other hemodynamically significant or correctable stenosis. Electronically Signed   By: Jeannine Boga M.D.   On: 01/11/2022 21:56   MR BRAIN  WO CONTRAST  Result Date: 01/11/2022 CLINICAL DATA:  Stroke, follow up.  EXAM: MRI HEAD WITHOUT CONTRAST TECHNIQUE: Multiplanar, multiecho pulse sequences of the brain and surrounding structures were obtained without intravenous contrast. COMPARISON:  Head CT January 11, 2022; MRI of the brain January 12, 2019 FINDINGS: Brain: Cortical focus of restricted diffusion in the right parietal lobe with corresponding increased signal on T2, suggestive of a small acute/subacute infarct. Thin right convexity subdural fluid collection with associated susceptibility artifact, measuring up to 2 mm without significant mass effect on the brain parenchyma. No hydrocephalus or mass lesion. Extensive supratentorial and infratentorial chronic white matter disease multiple small remote infarcts within the bilateral centrum semiovale, corona radiata, basal ganglia, thalami, pons and cerebellar hemispheres. Old cortical infarcts are also noted in the bilateral frontoparietal lobes. Innumerable foci of susceptibility artifact scattered throughout the brain parenchyma with central predominance. Moderate to advanced parenchymal volume loss. Vascular: Normal flow voids. Skull and upper cervical spine: Normal marrow signal. Sinuses/Orbits: Negative. Other: Bilateral frontal and right parietal scalp edema. IMPRESSION: 1. Focus of acute/subacute cortical infarct in the right parietal lobe. 2. Thin right cerebral convexity subdural hemorrhage measuring up to 2 mm without significant mass effect on the brain parenchyma. 3. 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 Electronically Signed   By: Pedro Earls M.D.   On: 01/11/2022 20:10   CT Cervical Spine Wo Contrast  Result Date: 01/11/2022 CLINICAL DATA:  Neck trauma. EXAM: CT CERVICAL SPINE WITHOUT CONTRAST TECHNIQUE: Multidetector CT imaging of the cervical spine was performed without intravenous contrast. Multiplanar CT image reconstructions were also  generated. RADIATION DOSE REDUCTION: This exam was performed according to the departmental dose-optimization program which includes automated exposure control, adjustment of the mA and/or kV according to patient size and/or use of iterative reconstruction technique. COMPARISON:  None Available. FINDINGS: Alignment: Normal. Skull base and vertebrae: No acute fracture. No primary bone lesion or focal pathologic process. Soft tissues and spinal canal: No prevertebral fluid or swelling. No visible canal hematoma. Disc levels: Multilevel osteoarthritic changes of the cervical spine. Upper chest: Negative. Other: None. IMPRESSION: 1. No acute fracture or subluxation of the cervical spine. 2. Multilevel osteoarthritic changes of the cervical spine. Electronically Signed   By: Fidela Salisbury M.D.   On: 01/11/2022 16:05   DG Pelvis Portable  Result Date: 01/11/2022 CLINICAL DATA:  Unwitnessed fall EXAM: PORTABLE PELVIS 1-2 VIEWS COMPARISON:  None Available. FINDINGS: LEFT femur internal fixation. No acute fracture dislocation in the pelvis or hips. IMPRESSION: No acute osseous abnormality. Electronically Signed   By: Suzy Bouchard M.D.   On: 01/11/2022 16:03   CT HEAD WO CONTRAST (5MM)  Result Date: 01/11/2022 CLINICAL DATA:  Head trauma. EXAM: CT HEAD WITHOUT CONTRAST TECHNIQUE: Contiguous axial images were obtained from the base of the skull through the vertex without intravenous contrast. RADIATION DOSE REDUCTION: This exam was performed according to the departmental dose-optimization program which includes automated exposure control, adjustment of the mA and/or kV according to patient size and/or use of iterative reconstruction technique. COMPARISON:  January 10, 2022 FINDINGS: Brain: No evidence of acute infarction, hemorrhage, hydrocephalus, extra-axial collection or mass lesion/mass effect. Marked brain parenchymal volume loss and deep white matter microangiopathy. Again seen is a minimal subdural  hematoma overlying the right frontal and temporal lobes. Vascular: No hyperdense vessel or unexpected calcification. Skull: Normal. Negative for fracture or focal lesion. Sinuses/Orbits: No acute finding. Other: Right frontoparietal  scalp hematoma. IMPRESSION: 1. Minimal subdural hematoma overlying the right frontal and temporal lobes. 2. Marked brain parenchymal volume loss and deep white matter microangiopathy. 3. Right frontoparietal scalp hematoma. Electronically Signed   By: Fidela Salisbury M.D.   On: 01/11/2022 16:02   DG Chest Portable 1 View  Result Date: 01/11/2022 CLINICAL DATA:  Pt to ED via [facility] from [facility] for an unwitnessed fall this afternoon. Per Staff from [facility], CNA was in the room helping pt eat, CNA went to take tray out of the room and came back in to find pt in the floor. Pt seen.*comment was truncated*fall EXAM: PORTABLE CHEST 1 VIEW COMPARISON:  01/09/2022 FINDINGS: Normal cardiac silhouette. LEFT basilar opacification. Pulmonary edema. No pleural fluid. No acute osseous abnormality. IMPRESSION: LEFT lower lobe infiltrate versus atelectasis. Electronically Signed   By: Suzy Bouchard M.D.   On: 01/11/2022 16:02   CT HEAD WO CONTRAST (5MM)  Result Date: 01/10/2022 CLINICAL DATA:  Follow-up hemorrhage EXAM: CT HEAD WITHOUT CONTRAST TECHNIQUE: Contiguous axial images were obtained from the base of the skull through the vertex without intravenous contrast. RADIATION DOSE REDUCTION: This exam was performed according to the departmental dose-optimization program which includes automated exposure control, adjustment of the mA and/or kV according to patient size and/or use of iterative reconstruction technique. COMPARISON:  01/09/2022 FINDINGS: Brain: No evidence of acute infarction, hydrocephalus, or mass lesion/mass effect. Trace subdural hemorrhage overlying the right temporal lobe measures 2 mm in thickness (series 3/image 12), improved and only faintly visible. Mild  global cortical and central atrophy with secondary ventricular prominence. Subcortical white matter and periventricular small vessel ischemic changes. Moderate screening buttock overlying the right frontal bone (series 4/image 25). Vascular: Mild intracranial atherosclerosis. Skull: Normal. Negative for fracture or focal lesion. Sinuses/Orbits: The visualized paranasal sinuses are essentially clear. The mastoid air cells are unopacified. Other: None. IMPRESSION: Trace subdural hemorrhage overlying the right temporal lobe, improved. Moderate screening hematoma overlying the right frontal bone. No evidence of calvarial fracture. Electronically Signed   By: Julian Hy M.D.   On: 01/10/2022 01:30   DG Pelvis 1-2 Views  Result Date: 01/09/2022 CLINICAL DATA:  Fall. EXAM: PELVIS - 1-2 VIEW COMPARISON:  Pelvis CT 02/19/2021 FINDINGS: Surgical fixation of remote left proximal femur fracture. Grossly intact hardware. There is no evidence of acute fracture. Pubic rami are intact. Right femoral head is well seated. Pubic symphysis and sacroiliac joints are congruent. Multiple gluteal granuloma. IMPRESSION: No acute pelvic fracture. Electronically Signed   By: Keith Rake M.D.   On: 01/09/2022 19:13   DG Chest 1 View  Result Date: 01/09/2022 CLINICAL DATA:  Fall. EXAM: CHEST  1 VIEW COMPARISON:  Radiograph 12/07/2018 FINDINGS: Low lung volumes. Anterior lordotic positioning limits assessment. Similar aortic tortuosity. Heart size not well assessed on provided views. Previous hiatal hernia is not well-defined. No pneumothorax, pleural effusion, or focal airspace disease. The bones are diffusely under mineralized. No displaced rib fractures. IMPRESSION: Low lung volumes without acute finding. Electronically Signed   By: Keith Rake M.D.   On: 01/09/2022 19:12   DG Knee 1-2 Views Right  Result Date: 01/09/2022 CLINICAL DATA:  Fall. EXAM: RIGHT KNEE - 1-2 VIEW COMPARISON:  None Available. FINDINGS:  Lateral view limited by positioning. The bones are diffusely under mineralized. Allowing for these limitations, no fracture. No dislocation. Mild osteoarthritis. No large knee joint effusion, although assessment is limited. Phlebolith projects over the prepatellar soft tissues. IMPRESSION: 1. No fracture or dislocation of  the right knee. 2. Mild osteoarthritis. Electronically Signed   By: Keith Rake M.D.   On: 01/09/2022 19:12   DG Elbow 2 Views Left  Result Date: 01/09/2022 CLINICAL DATA:  Fall. EXAM: LEFT ELBOW - 2 VIEW COMPARISON:  None Available. FINDINGS: There is no evidence of fracture, dislocation, or joint effusion. Normal joint spaces on provided views. Mild soft tissue edema. No soft tissue gas. No radiopaque foreign body. IMPRESSION: Mild soft tissue edema. No fracture or subluxation. Electronically Signed   By: Keith Rake M.D.   On: 01/09/2022 19:10   CT HEAD WO CONTRAST (5MM)  Result Date: 01/09/2022 CLINICAL DATA:  Head trauma, fall. EXAM: CT HEAD WITHOUT CONTRAST CT CERVICAL SPINE WITHOUT CONTRAST TECHNIQUE: Multidetector CT imaging of the head and cervical spine was performed following the standard protocol without intravenous contrast. Multiplanar CT image reconstructions of the cervical spine were also generated. RADIATION DOSE REDUCTION: This exam was performed according to the departmental dose-optimization program which includes automated exposure control, adjustment of the mA and/or kV according to patient size and/or use of iterative reconstruction technique. COMPARISON:  02/19/2021 FINDINGS: CT HEAD FINDINGS Brain: Stable small lacunar infarct in the right upper cerebellum on image 9 series 3. Small lacunar infarct in the right caudate nucleus and likely involving the right internal capsule. Periventricular white matter and corona radiata hypodensities favor chronic ischemic microvascular white matter disease. Otherwise, the brainstem, cerebellum, cerebral peduncles,  thalamus, basal ganglia, basilar cisterns, and ventricular system appear within normal limits. Small right subdural hematoma primarily adjacent to the temporal lobe for example on image 15 series 3, maximum thickness 0.3 cm. No midline shift. No other intracranial hemorrhage is identified. Vascular: There is atherosclerotic calcification of the cavernous carotid arteries bilaterally. Skull: Hyperostosis frontalis interna. Sinuses/Orbits: Unremarkable Other: Right frontal scalp hematoma. CT CERVICAL SPINE FINDINGS Alignment: Stable 2 mm grade 1 anterolisthesis at C6-7 and C7-T1. Skull base and vertebrae: Remote mild compression fracture at the T2 vertebral level with mild vertebral sclerosis, similar to that shown on 02/19/2021. No acute cervical spine fracture is identified. Soft tissues and spinal canal: Bilateral common carotid atherosclerotic calcification. Disc levels: Uncinate and facet spurring cause osseous foraminal stenosis on the right at C3-4, C4-5, and C5-6. Upper chest: Unremarkable Other: No supplemental non-categorized findings. IMPRESSION: 1. Small acute right subdural hematoma, up to 0.3 cm in thickness. 2. Hematoma in the right frontal scalp. 3. Periventricular white matter and corona radiata hypodensities favor chronic ischemic microvascular white matter disease. 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 4. Atherosclerosis. 5. Remote mild compression fracture at T2. 6. Right foraminal impingement at C3-4, C4-5, and C5-6. Critical Value/emergent results were called by telephone at the time of interpretation on 01/09/2022 at 6:50 pm to provider Dr. Marjean Donna , who verbally acknowledged these results. Electronically Signed   By: Van Clines M.D.   On: 01/09/2022 18:50   CT Cervical Spine Wo Contrast  Result Date: 01/09/2022 CLINICAL DATA:  Head trauma, fall. EXAM: CT HEAD WITHOUT CONTRAST CT CERVICAL SPINE WITHOUT CONTRAST TECHNIQUE: Multidetector  CT imaging of the head and cervical spine was performed following the standard protocol without intravenous contrast. Multiplanar CT image reconstructions of the cervical spine were also generated. RADIATION DOSE REDUCTION: This exam was performed according to the departmental dose-optimization program which includes automated exposure control, adjustment of the mA and/or kV according to patient size and/or use of iterative reconstruction technique. COMPARISON:  02/19/2021 FINDINGS: CT HEAD FINDINGS Brain: Stable small  lacunar infarct in the right upper cerebellum on image 9 series 3. Small lacunar infarct in the right caudate nucleus and likely involving the right internal capsule. Periventricular white matter and corona radiata hypodensities favor chronic ischemic microvascular white matter disease. Otherwise, the brainstem, cerebellum, cerebral peduncles, thalamus, basal ganglia, basilar cisterns, and ventricular system appear within normal limits. Small right subdural hematoma primarily adjacent to the temporal lobe for example on image 15 series 3, maximum thickness 0.3 cm. No midline shift. No other intracranial hemorrhage is identified. Vascular: There is atherosclerotic calcification of the cavernous carotid arteries bilaterally. Skull: Hyperostosis frontalis interna. Sinuses/Orbits: Unremarkable Other: Right frontal scalp hematoma. CT CERVICAL SPINE FINDINGS Alignment: Stable 2 mm grade 1 anterolisthesis at C6-7 and C7-T1. Skull base and vertebrae: Remote mild compression fracture at the T2 vertebral level with mild vertebral sclerosis, similar to that shown on 02/19/2021. No acute cervical spine fracture is identified. Soft tissues and spinal canal: Bilateral common carotid atherosclerotic calcification. Disc levels: Uncinate and facet spurring cause osseous foraminal stenosis on the right at C3-4, C4-5, and C5-6. Upper chest: Unremarkable Other: No supplemental non-categorized findings. IMPRESSION: 1.  Small acute right subdural hematoma, up to 0.3 cm in thickness. 2. Hematoma in the right frontal scalp. 3. Periventricular white matter and corona radiata hypodensities favor chronic ischemic microvascular white matter disease. 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 4. Atherosclerosis. 5. Remote mild compression fracture at T2. 6. Right foraminal impingement at C3-4, C4-5, and C5-6. Critical Value/emergent results were called by telephone at the time of interpretation on 01/09/2022 at 6:50 pm to provider Dr. Marjean Donna , who verbally acknowledged these results. Electronically Signed   By: Van Clines M.D.   On: 01/09/2022 18:50      Subjective: Seen and examined at the time of discharge.  While patient is in no distress she is verbally unresponsive.  Limited/no oral intake.  Appropriate for disposition inpatient hospice facility.  Discharge Exam: Vitals:   01/14/22 0821 01/14/22 1555  BP: (!) 101/53 (!) 144/77  Pulse: (!) 59 79  Resp: 18 16  Temp: 98.6 F (37 C) 97.7 F (36.5 C)  SpO2: 97% 99%   Vitals:   01/14/22 0445 01/14/22 0613 01/14/22 0821 01/14/22 1555  BP: 116/73 101/60 (!) 101/53 (!) 144/77  Pulse: 75 66 (!) 59 79  Resp: $Remo'18 14 18 16  'JdRkx$ Temp: 98 F (36.7 C) 98.9 F (37.2 C) 98.6 F (37 C) 97.7 F (36.5 C)  TempSrc:   Axillary   SpO2: 95% 94% 97% 99%  Weight:      Height:           The results of significant diagnostics from this hospitalization (including imaging, microbiology, ancillary and laboratory) are listed below for reference.     Microbiology: Recent Results (from the past 240 hour(s))  SARS Coronavirus 2 by RT PCR (hospital order, performed in [facility] hospital lab) *cepheid single result test* Anterior Nasal Swab     Status: None   Collection Time: 01/12/22  3:40 AM   Specimen: Anterior Nasal Swab  Result Value Ref Range Status   SARS Coronavirus 2 by RT PCR NEGATIVE NEGATIVE Final    Comment:  (NOTE) SARS-CoV-2 target nucleic acids are NOT DETECTED.  The SARS-CoV-2 RNA is generally detectable in upper and lower respiratory specimens during the acute phase of infection. The lowest concentration of SARS-CoV-2 viral copies this assay can detect is 250 copies / mL. A negative result does not  preclude SARS-CoV-2 infection and should not be used as the sole basis for treatment or other patient management decisions.  A negative result may occur with improper specimen collection / handling, submission of specimen other than nasopharyngeal swab, presence of viral mutation(s) within the areas targeted by this assay, and inadequate number of viral copies (<250 copies / mL). A negative result must be combined with clinical observations, patient history, and epidemiological information.  Fact Sheet for Patients:   https://www.patel.info/  Fact Sheet for Healthcare Providers: https://hall.com/  This test is not yet approved or  cleared by the [place] FDA and has been authorized for detection and/or diagnosis of SARS-CoV-2 by FDA under an Emergency Use Authorization (EUA).  This EUA will remain in effect (meaning this test can be used) for the duration of the COVID-19 declaration under Section 564(b)(1) of the Act, 21 U.S.C. section 360bbb-3(b)(1), unless the authorization is terminated or revoked sooner.  Performed at [facility], [address] [place], [place] [address]      Labs: BNP (last 3 results) No results for input(s): "BNP" in the last 8760 hours. Basic Metabolic Panel: Recent Labs  Lab 01/09/22 1759 01/11/22 1635 01/13/22 1809 01/14/22 0641  NA 139 144 141 139  K 3.7 3.6 3.6 3.4*  CL 99 104 100 107  CO2 $Re'27 29 25 28  'Sfr$ GLUCOSE 123* 108* 135* 103*  BUN 26* 24* 19 17  CREATININE 1.37* 0.84 0.88 0.75  CALCIUM 9.3 9.2 9.2 8.5*   Liver Function Tests: Recent Labs  Lab 01/09/22 1759  AST 37  ALT 25   ALKPHOS 51  BILITOT 0.8  PROT 7.4  ALBUMIN 3.9   No results for input(s): "LIPASE", "AMYLASE" in the last 168 hours. No results for input(s): "AMMONIA" in the last 168 hours. CBC: Recent Labs  Lab 01/09/22 1759 01/11/22 1635  WBC 10.2 9.6  NEUTROABS 7.7 6.6  HGB 14.3 12.7  HCT 43.0 38.8  MCV 90.9 91.7  PLT 307 295   Cardiac Enzymes: Recent Labs  Lab 01/09/22 1759 01/11/22 1635  CKTOTAL 397* 219   BNP: Invalid input(s): "POCBNP" CBG: No results for input(s): "GLUCAP" in the last 168 hours. D-Dimer No results for input(s): "DDIMER" in the last 72 hours. Hgb A1c Recent Labs    01/11/22 1635  HGBA1C 5.2   Lipid Profile Recent Labs    01/12/22 0340  CHOL 121  HDL 59  LDLCALC 47  TRIG 76  CHOLHDL 2.1   Thyroid function studies No results for input(s): "TSH", "T4TOTAL", "T3FREE", "THYROIDAB" in the last 72 hours.  Invalid input(s): "FREET3" Anemia work up No results for input(s): "VITAMINB12", "FOLATE", "FERRITIN", "TIBC", "IRON", "RETICCTPCT" in the last 72 hours. Urinalysis    Component Value Date/Time   COLORURINE AMBER (A) 01/09/2022 2040   APPEARANCEUR CLOUDY (A) 01/09/2022 2040   APPEARANCEUR Clear 03/27/2018 1015   LABSPEC 1.019 01/09/2022 2040   PHURINE 6.0 01/09/2022 2040   GLUCOSEU NEGATIVE 01/09/2022 2040   HGBUR SMALL (A) 01/09/2022 2040   BILIRUBINUR NEGATIVE 01/09/2022 2040   BILIRUBINUR Negative 03/27/2018 1015   KETONESUR 20 (A) 01/09/2022 2040   PROTEINUR 30 (A) 01/09/2022 2040   UROBILINOGEN Normal 02/04/2018 0000   NITRITE NEGATIVE 01/09/2022 2040   LEUKOCYTESUR NEGATIVE 01/09/2022 2040   Sepsis Labs Recent Labs  Lab 01/09/22 1759 01/11/22 1635  WBC 10.2 9.6   Microbiology Recent Results (from the past 240 hour(s))  SARS Coronavirus 2 by RT PCR (hospital order, performed in [facility] hospital lab) *  cepheid single result test* Anterior Nasal Swab     Status: None   Collection Time: 01/12/22  3:40 AM   Specimen: Anterior  Nasal Swab  Result Value Ref Range Status   SARS Coronavirus 2 by RT PCR NEGATIVE NEGATIVE Final    Comment: (NOTE) SARS-CoV-2 target nucleic acids are NOT DETECTED.  The SARS-CoV-2 RNA is generally detectable in upper and lower respiratory specimens during the acute phase of infection. The lowest concentration of SARS-CoV-2 viral copies this assay can detect is 250 copies / mL. A negative result does not preclude SARS-CoV-2 infection and should not be used as the sole basis for treatment or other patient management decisions.  A negative result may occur with improper specimen collection / handling, submission of specimen other than nasopharyngeal swab, presence of viral mutation(s) within the areas targeted by this assay, and inadequate number of viral copies (<250 copies / mL). A negative result must be combined with clinical observations, patient history, and epidemiological information.  Fact Sheet for Patients:   https://www.patel.info/  Fact Sheet for Healthcare Providers: https://hall.com/  This test is not yet approved or  cleared by the [place] FDA and has been authorized for detection and/or diagnosis of SARS-CoV-2 by FDA under an Emergency Use Authorization (EUA).  This EUA will remain in effect (meaning this test can be used) for the duration of the COVID-19 declaration under Section 564(b)(1) of the Act, 21 U.S.C. section 360bbb-3(b)(1), unless the authorization is terminated or revoked sooner.  Performed at [facility], [address] [place], [place] [address]      Time coordinating discharge: Over 30 minutes  SIGNED:   Sidney Ace, MD  [facility] 01/14/2022, 4:21 PM Pager   If 7PM-7AM, please contact night-coverage

## 2022-01-31 DEATH — deceased

## 2022-02-13 NOTE — Progress Notes (Deleted)
Referring Physician:  Sherol Dade, DO [address] [place],  [place] [address]  Primary Physician:  Jill Dade, DO  History of Present Illness: 02/13/2022 [name] is here today with a chief complaint of ***  Duration: *** Location: *** Quality: *** Severity: ***  Precipitating: aggravated by *** Modifying factors: made better by *** Weakness: none Timing: *** Bowel/Bladder Dysfunction: none  Conservative measures:  Physical therapy: ***  Multimodal medical therapy including regular antiinflammatories: ***  Injections: *** epidural steroid injections  Past Surgery: ***  Jill Hancock has ***no symptoms of cervical myelopathy.  The symptoms are causing a significant impact on the patient's life.   Review of Systems:  A 10 point review of systems is negative, except for the pertinent positives and negatives detailed in the HPI.  Past Medical History: Past Medical History:  Diagnosis Date   Anxiety    Depression    Hyperglycemia    Hypertension    Hypokalemia    Osteoporosis    Stroke (HCC) 02/2012   MRI revealed at least 3 subcentimeter acute infarctions with one area of subacute infarction in widely disparate vascular territories including territory of left cerebellum and around right caudate nucleus along with widespread lacunar infarcts, chronic microvascular ischemic change, and numerous microbleeds suggesting long standing hypertensive cerebrovascular disease.  MRA confirmed intracranial   Unsteady gait    Vertigo     Past Surgical History: Past Surgical History:  Procedure Laterality Date   BREAST BIOPSY Right    pt not sure when    CHOLECYSTECTOMY     INTRAMEDULLARY (IM) NAIL INTERTROCHANTERIC Left 11/21/2018   Procedure: INTRAMEDULLARY (IM) NAIL INTERTROCHANTRIC;  Surgeon: Jill Fairly, MD;  Location: [facility] ORS;  Service: Orthopedics;  Laterality: Left;   VAGINAL HYSTERECTOMY      Allergies: Allergies as of  02/14/2022 - Review Complete 01/12/2022  Allergen Reaction Noted   Demerol [meperidine] Nausea And Vomiting 02/24/2012   Macrobid [nitrofurantoin macrocrystal] Rash 12/02/2012   Penicillins Rash 02/24/2012    Medications: No outpatient medications have been marked as taking for the 02/14/22 encounter (Appointment) with Jill Night, MD.    Social History: Social History   Tobacco Use   Smoking status: Never   Smokeless tobacco: Never  Vaping Use   Vaping Use: Never used  Substance Use Topics   Alcohol use: No    Alcohol/week: 0.0 standard drinks of alcohol   Drug use: No    Family Medical History: Family History  Problem Relation Age of Onset   Heart attack Mother    Heart disease Mother    Heart disease Father    Heart attack Brother        death at age 88   Stroke Brother        death at 1   Bladder Cancer Neg Hx    Kidney cancer Neg Hx     Physical Examination: There were no vitals filed for this visit.  General: Patient is well developed, well nourished, calm, collected, and in no apparent distress. Attention to examination is appropriate.  Neck:   Supple.  ***Full range of motion.  Respiratory: Patient is breathing without any difficulty.   NEUROLOGICAL:     Awake, alert, oriented to person, place, and time.  Speech is clear and fluent. Fund of knowledge is appropriate.   Cranial Nerves: Pupils equal round and reactive to light.  Facial tone is symmetric.  Facial sensation is symmetric. Shoulder shrug is symmetric. Tongue protrusion is  midline.  There is no pronator drift.  ROM of spine: full.    Strength: Side Biceps Triceps Deltoid Interossei Grip Wrist Ext. Wrist Flex.  R 5 5 5 5 5 5 5   L 5 5 5 5 5 5 5    Side Iliopsoas Quads Hamstring PF DF EHL  R 5 5 5 5 5 5   L 5 5 5 5 5 5    Reflexes are ***2+ and symmetric at the biceps, triceps, brachioradialis, patella and achilles.   Hoffman's is absent.  Clonus is not present.  Toes are  down-going.  Bilateral upper and lower extremity sensation is intact to light touch.    No evidence of dysmetria noted.  Gait is normal.   ***No difficulty with tandem gait.    Medical Decision Making  Imaging: ***  I have personally reviewed the images and agree with the above interpretation.  Assessment and Plan: Ms. Cederberg is a pleasant 81 y.o. female with ***   I spent a total of *** minutes in face-to-face and non-face-to-face activities related to this patient's care today.  Thank you for involving me in the care of this patient.      Jill K. MD, [facility]

## 2022-02-14 ENCOUNTER — Ambulatory Visit: Payer: Medicare Other | Admitting: Neurosurgery
